# Patient Record
Sex: Male | Born: 1960 | Race: White | Hispanic: No | Marital: Married | Smoking: Never smoker
Health system: Southern US, Community
[De-identification: ages and names within clinical notes are randomized; demographics above are authoritative.]

## PROBLEM LIST (undated history)

## (undated) DIAGNOSIS — Z9889 Other specified postprocedural states: Secondary | ICD-10-CM

## (undated) DIAGNOSIS — G43909 Migraine, unspecified, not intractable, without status migrainosus: Secondary | ICD-10-CM

## (undated) DIAGNOSIS — I639 Cerebral infarction, unspecified: Secondary | ICD-10-CM

## (undated) DIAGNOSIS — G4730 Sleep apnea, unspecified: Secondary | ICD-10-CM

## (undated) DIAGNOSIS — I219 Acute myocardial infarction, unspecified: Secondary | ICD-10-CM

## (undated) DIAGNOSIS — K648 Other hemorrhoids: Secondary | ICD-10-CM

## (undated) DIAGNOSIS — M542 Cervicalgia: Secondary | ICD-10-CM

## (undated) DIAGNOSIS — G629 Polyneuropathy, unspecified: Secondary | ICD-10-CM

## (undated) DIAGNOSIS — E11319 Type 2 diabetes mellitus with unspecified diabetic retinopathy without macular edema: Secondary | ICD-10-CM

## (undated) DIAGNOSIS — Z87442 Personal history of urinary calculi: Secondary | ICD-10-CM

## (undated) DIAGNOSIS — G8929 Other chronic pain: Secondary | ICD-10-CM

## (undated) DIAGNOSIS — N200 Calculus of kidney: Secondary | ICD-10-CM

## (undated) DIAGNOSIS — E7800 Pure hypercholesterolemia, unspecified: Secondary | ICD-10-CM

## (undated) DIAGNOSIS — Z8739 Personal history of other diseases of the musculoskeletal system and connective tissue: Secondary | ICD-10-CM

## (undated) DIAGNOSIS — K219 Gastro-esophageal reflux disease without esophagitis: Secondary | ICD-10-CM

## (undated) DIAGNOSIS — R51 Headache: Secondary | ICD-10-CM

## (undated) DIAGNOSIS — K224 Dyskinesia of esophagus: Secondary | ICD-10-CM

## (undated) DIAGNOSIS — I2510 Atherosclerotic heart disease of native coronary artery without angina pectoris: Secondary | ICD-10-CM

## (undated) DIAGNOSIS — E119 Type 2 diabetes mellitus without complications: Secondary | ICD-10-CM

## (undated) DIAGNOSIS — J342 Deviated nasal septum: Secondary | ICD-10-CM

## (undated) DIAGNOSIS — N289 Disorder of kidney and ureter, unspecified: Secondary | ICD-10-CM

## (undated) DIAGNOSIS — K2970 Gastritis, unspecified, without bleeding: Secondary | ICD-10-CM

## (undated) DIAGNOSIS — M1990 Unspecified osteoarthritis, unspecified site: Secondary | ICD-10-CM

## (undated) DIAGNOSIS — R519 Headache, unspecified: Secondary | ICD-10-CM

## (undated) DIAGNOSIS — J189 Pneumonia, unspecified organism: Secondary | ICD-10-CM

## (undated) DIAGNOSIS — R140 Abdominal distension (gaseous): Secondary | ICD-10-CM

## (undated) DIAGNOSIS — M549 Dorsalgia, unspecified: Secondary | ICD-10-CM

## (undated) DIAGNOSIS — I739 Peripheral vascular disease, unspecified: Secondary | ICD-10-CM

## (undated) DIAGNOSIS — A0472 Enterocolitis due to Clostridium difficile, not specified as recurrent: Secondary | ICD-10-CM

## (undated) DIAGNOSIS — I10 Essential (primary) hypertension: Secondary | ICD-10-CM

## (undated) DIAGNOSIS — R112 Nausea with vomiting, unspecified: Secondary | ICD-10-CM

## (undated) DIAGNOSIS — R931 Abnormal findings on diagnostic imaging of heart and coronary circulation: Secondary | ICD-10-CM

## (undated) HISTORY — DX: Peripheral vascular disease, unspecified: I73.9

## (undated) HISTORY — PX: REFRACTIVE SURGERY: SHX103

## (undated) HISTORY — DX: Dyskinesia of esophagus: K22.4

## (undated) HISTORY — DX: Pure hypercholesterolemia, unspecified: E78.00

## (undated) HISTORY — DX: Essential (primary) hypertension: I10

## (undated) HISTORY — DX: Abdominal distension (gaseous): R14.0

## (undated) HISTORY — DX: Cerebral infarction, unspecified: I63.9

## (undated) HISTORY — DX: Abnormal findings on diagnostic imaging of heart and coronary circulation: R93.1

## (undated) HISTORY — DX: Acute myocardial infarction, unspecified: I21.9

## (undated) HISTORY — DX: Type 2 diabetes mellitus with unspecified diabetic retinopathy without macular edema: E11.319

## (undated) HISTORY — PX: COLONOSCOPY W/ POLYPECTOMY: SHX1380

## (undated) HISTORY — DX: Gastritis, unspecified, without bleeding: K29.70

## (undated) HISTORY — DX: Other hemorrhoids: K64.8

## (undated) HISTORY — PX: HYDROCELE EXCISION: SHX482

---

## 1979-12-10 HISTORY — PX: CIRCUMCISION: SUR203

## 2012-12-09 DIAGNOSIS — I639 Cerebral infarction, unspecified: Secondary | ICD-10-CM

## 2012-12-09 HISTORY — DX: Cerebral infarction, unspecified: I63.9

## 2013-04-07 DIAGNOSIS — I10 Essential (primary) hypertension: Secondary | ICD-10-CM | POA: Insufficient documentation

## 2013-04-07 DIAGNOSIS — E1165 Type 2 diabetes mellitus with hyperglycemia: Secondary | ICD-10-CM

## 2013-04-07 DIAGNOSIS — I6529 Occlusion and stenosis of unspecified carotid artery: Secondary | ICD-10-CM | POA: Insufficient documentation

## 2013-04-07 DIAGNOSIS — I6381 Other cerebral infarction due to occlusion or stenosis of small artery: Secondary | ICD-10-CM

## 2013-04-07 DIAGNOSIS — K219 Gastro-esophageal reflux disease without esophagitis: Secondary | ICD-10-CM | POA: Insufficient documentation

## 2013-04-07 DIAGNOSIS — E785 Hyperlipidemia, unspecified: Secondary | ICD-10-CM

## 2013-04-07 DIAGNOSIS — M489 Spondylopathy, unspecified: Secondary | ICD-10-CM

## 2013-04-07 DIAGNOSIS — IMO0002 Reserved for concepts with insufficient information to code with codable children: Secondary | ICD-10-CM

## 2013-04-07 DIAGNOSIS — I2510 Atherosclerotic heart disease of native coronary artery without angina pectoris: Secondary | ICD-10-CM

## 2013-04-07 HISTORY — DX: Type 2 diabetes mellitus with hyperglycemia: E11.65

## 2013-04-07 HISTORY — DX: Hyperlipidemia, unspecified: E78.5

## 2013-04-07 HISTORY — DX: Occlusion and stenosis of unspecified carotid artery: I65.29

## 2013-04-07 HISTORY — DX: Spondylopathy, unspecified: M48.9

## 2013-04-07 HISTORY — DX: Other cerebral infarction due to occlusion or stenosis of small artery: I63.81

## 2013-04-07 HISTORY — DX: Atherosclerotic heart disease of native coronary artery without angina pectoris: I25.10

## 2013-04-07 HISTORY — DX: Reserved for concepts with insufficient information to code with codable children: IMO0002

## 2013-04-07 HISTORY — DX: Essential (primary) hypertension: I10

## 2013-12-09 DIAGNOSIS — R112 Nausea with vomiting, unspecified: Secondary | ICD-10-CM

## 2013-12-09 DIAGNOSIS — Z9889 Other specified postprocedural states: Secondary | ICD-10-CM

## 2013-12-09 HISTORY — DX: Other specified postprocedural states: Z98.890

## 2013-12-09 HISTORY — DX: Other specified postprocedural states: R11.2

## 2014-07-04 ENCOUNTER — Encounter: Payer: Self-pay | Admitting: Neurology

## 2014-07-04 ENCOUNTER — Ambulatory Visit (INDEPENDENT_AMBULATORY_CARE_PROVIDER_SITE_OTHER): Payer: No Typology Code available for payment source | Admitting: Neurology

## 2014-07-04 VITALS — BP 160/88 | HR 87 | Ht 65.0 in | Wt 203.9 lb

## 2014-07-04 DIAGNOSIS — E1165 Type 2 diabetes mellitus with hyperglycemia: Secondary | ICD-10-CM

## 2014-07-04 DIAGNOSIS — E1140 Type 2 diabetes mellitus with diabetic neuropathy, unspecified: Secondary | ICD-10-CM

## 2014-07-04 DIAGNOSIS — I63311 Cerebral infarction due to thrombosis of right middle cerebral artery: Secondary | ICD-10-CM | POA: Insufficient documentation

## 2014-07-04 DIAGNOSIS — I639 Cerebral infarction, unspecified: Secondary | ICD-10-CM

## 2014-07-04 DIAGNOSIS — E1159 Type 2 diabetes mellitus with other circulatory complications: Secondary | ICD-10-CM | POA: Insufficient documentation

## 2014-07-04 DIAGNOSIS — I6350 Cerebral infarction due to unspecified occlusion or stenosis of unspecified cerebral artery: Secondary | ICD-10-CM

## 2014-07-04 DIAGNOSIS — IMO0001 Reserved for inherently not codable concepts without codable children: Secondary | ICD-10-CM

## 2014-07-04 DIAGNOSIS — Z8673 Personal history of transient ischemic attack (TIA), and cerebral infarction without residual deficits: Secondary | ICD-10-CM | POA: Insufficient documentation

## 2014-07-04 DIAGNOSIS — I6521 Occlusion and stenosis of right carotid artery: Secondary | ICD-10-CM

## 2014-07-04 DIAGNOSIS — I10 Essential (primary) hypertension: Secondary | ICD-10-CM

## 2014-07-04 DIAGNOSIS — E785 Hyperlipidemia, unspecified: Secondary | ICD-10-CM

## 2014-07-04 DIAGNOSIS — I6529 Occlusion and stenosis of unspecified carotid artery: Secondary | ICD-10-CM

## 2014-07-04 HISTORY — DX: Cerebral infarction due to thrombosis of right middle cerebral artery: I63.311

## 2014-07-04 HISTORY — DX: Type 2 diabetes mellitus with diabetic neuropathy, unspecified: E11.40

## 2014-07-04 NOTE — Patient Instructions (Signed)
1. Will do carotid doppler to evaluate the carotid stenosis especially the right one. 2. Will decide if need CT angio neck or MRI angio of neck 3. Still has risk factor or DM, HTN, HLD and needs continued control.  4. Follow up with PCP for risk factor contrl 5. Continue ASA and lipitor for stroke prevention 6. Blood work today - A1C, lipid profile, TSH, free T4, LFT 7. Follow up in 2 months.

## 2014-07-04 NOTE — Progress Notes (Signed)
NEUROLOGY CLINIC NEW PATIENT NOTE  NAME: Johnathan Keith DOB: February 08, 1961  I saw Johnathan Keith as a new patient in the neurovascular clinic today regarding hx of stroke and carotid stenosis.  HPI: Johnathan Keith is a 53 y.o. male with PMH of HTN, DM, HLD and stroke presented as now patient. According to the note, "he was admitted to [facility] on March 08, 2013, with a 2 day history of disequilibrium and difficulties with balance, "feeling like he is walking drunk". With this, disequilibrium, he noticed that he was drifting to the left. On presentation to his local emergency department he was found to have hypertensive urgency with blood pressures in excess of 200 systolic and 100 diastolic, he also had blood glucose levels in the 400s. The patient has a long-standing history of hypertension and diabetes but was not on any medical therapy due to noncompliance. With the above symptomatology he also endorsed some some left facial numbness and tingling. He was admitted to the hospital and underwent a complete workup for TIA and stroke. CT of the head was unremarkable as well as an MRI MRA of the brain dated 03/08/2013, which did not show any acute or subacute ischemia, however, it did show chronic small vessel ischemic disease in the bilateral cerebral hemispheres as well as the pons. In addition, the patient was found by carotid Doppler ultrasound to have a high-grade right ICA stenosis gauged at approximately 70%, his ICA/CCA ratio was 3.19, as compared to his left ICA/CCA ratio 1.02. His transthoracic echocardiogram was otherwise unremarkable with a normal ejection fraction. Laboratory studies taken during his hospitalization revealed normal coagulation, creatinine 0.40. Elevated transaminases, AST 72, ALP 92, and a hemoglobin A1c of 13.3. 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 When questioned about his sleep,he does report snoring heavily but has never awoken himself snoring nor does his wife report seeing him have any pauses in breathing or gasping for breath. During his hospitalization, MRI of the neck also revealed degenerative disc disease without cord compression, most prominent at C 6-7. He reports a history of left-sided arm, neck and shoulder pain which resolved approximately a year ago. Currently, he has only been having intermittent pain into his left hand which radiates back to the elbow. In addition since discharge, the patient has been having occasional bilateral blurry vision in the a.m. which improves throughout the day time, he attributes his vision changes to improvement in his glucose control. He has also been having increased lower extremity dependent pitting edema, which is likely secondary to starting amlodipine. He has been undergoing physical therapy on an outpatient basis and has progressed from using a walker to a quad cane for balance."  Since the stroke, pt was admitted to rehab for 3-4 weeks before going home. He stated that his left sided weakness getting improved over the year, but still has left leg dragging if he walks long distance. He still has left facial and arm numbness tingling sensation. He follows up with his PCP for HTN and DM and lipid control, and his glucose initially controlled good but lately has some problems. His HTN still intermittently high. He has also followed with outside neurologist regarding the right ICA stenosis >70% but was told that he needs cerebral angiogram before able to do anything. However, that was never happened because he did not have insurance at that time. Now,  he got limited insurance and he is applying for disability but his neurologist has left practices. Therefore, he was referred here for follow up.  Outside reports reviewed: historical medical records and imaging reports: see above  Past  Medical History  Diagnosis Date  . Hypertension   . Diabetes mellitus without complication   . High cholesterol   . Stroke   . Heart attack    Past Surgical History  Procedure Laterality Date  . Hydrocele excision     Family History  Problem Relation Age of Onset  . Heart disease Mother   . Cancer Father   . Colon cancer Sister   . Gout Maternal Uncle   . Diabetes Maternal Grandfather   . Gout Brother    Current Outpatient Prescriptions  Medication Sig Dispense Refill  . amLODipine (NORVASC) 5 MG tablet Take 5 mg by mouth daily.      Marland Kitchen aspirin 325 MG tablet Take 325 mg by mouth daily.      Marland Kitchen atorvastatin (LIPITOR) 40 MG tablet Take 40 mg by mouth at bedtime.      . dicyclomine (BENTYL) 10 MG capsule Take 10 mg by mouth 2 (two) times daily.      . Diphenoxylate-Atropine (LOMOTIL PO) Take 10 tablets by mouth 2 (two) times daily.      . furosemide (LASIX) 80 MG tablet Take 80 mg by mouth daily.      . insulin NPH-regular Human (NOVOLIN 70/30) (70-30) 100 UNIT/ML injection Inject 50 Units into the skin 2 (two) times daily.      Marland Kitchen lisinopril (PRINIVIL,ZESTRIL) 40 MG tablet Take 40 mg by mouth daily.      . metoprolol tartrate (LOPRESSOR) 25 MG tablet Take 25 mg by mouth 2 (two) times daily.      Marland Kitchen omeprazole (PRILOSEC) 20 MG capsule Take 20 mg by mouth daily.      . potassium chloride SA (K-DUR,KLOR-CON) 20 MEQ tablet Take 20 mEq by mouth daily.       No current facility-administered medications for this visit.   No Known Allergies History   Social History  . Marital Status: Unknown    Spouse Name: N/A    Number of Children: 5  . Years of Education: N/A   Occupational History  . Not on file.   Social History Main Topics  . Smoking status: Former Games developer  . Smokeless tobacco: Not on file  . Alcohol Use: No  . Drug Use: No  . Sexual Activity: Yes    Partners: Female   Other Topics Concern  . Not on file   Social History Narrative   Patient lives with his family      Patient is right handed   Patient coffee     Review of Systems Full 14 system review of systems performed and notable only for those listed, all others are neg:  Constitutional: N/A  Cardiovascular: N/A  Ear/Nose/Throat: blurry vision Skin: N/A  Eyes: N/A  Respiratory: N/A  Gastroitestinal: diarrhea Hematology/Lymphatic: N/A  Endocrine: feeling hot and cold Musculoskeletal: joint pain  Allergy/Immunology: allergies  Neurological: N/A  Psychiatric: N/A  Physical Exam  Filed Vitals:   07/04/14 1026  BP: 160/88  Pulse: 87   General - Well nourished, well developed, in no apparent distress.  Ophthalmologic - Sharp disc margins OU.  Cardiovascular - Regular rate and rhythm with no murmur. Carotid pulses were 2+ without bruits.   Neck - supple, no nuchal rigidity.  Mental Status -  Level of  arousal and orientation to time, place, and person were intact. Language including expression, naming, repetition, comprehension was assessed and found intact. Attention span and concentration were normal. Recent and remote memory were intact. Fund of Knowledge was assessed and was intact.  Cranial Nerves II - XII - II - Vision intact OU. III, IV, VI - Extraocular movements intact. V - Facial sensation decreased on the left. VII - Facial movement intact bilaterally. VIII - Hearing & vestibular intact bilaterally. X - Palate elevates symmetrically. XI - Chin turning & shoulder shrug intact bilaterally. XII - Tongue protrusion intact.  Motor Strength - The patient's strength was normal in all extremities and pronator drift was absent.  Bulk was normal and fasciculations were absent.   Motor Tone - Muscle tone was assessed at the neck and appendages and was normal.  Reflexes - The patient's reflexes were normal in all extremities and he had no pathological reflexes.  Sensory - Light touch, temperature/pinprick were assessed and were decreased on left UE.    Coordination - The  patient had normal movements in the hands and feet with no ataxia or dysmetria.  Tremor was absent.  Gait and Station - very mild left hemiparetic gait (left leg dragging).   Imaging 03/08/2013 - MRI brain did not show acute ischemia but questionable right pons small acute DWI changes but without ADC attenuation. Diffuse small vessel white matter changes.  Lab Review Not available  Assessments: NIHSS: 1 Rankin: 2   Assessment and Plan:   In summary, Johnathan Keith as new patient for clinic follow up of stroke and right carotid stenosis > 70% in 2014. He had left hemiparesis in 2014 but CT and MRI did not show acute stroke, however, by my read, it is questionable a small DWI change at right pons without corresponding ADC attenuation. Regardless, he has multiple stroke risk factors including HTN, DM, HLD, and right carotid stenosis. He is on ASA and lipitor now for stroke prevention. He needs to continue follow up with his PCP for stroke risk factor modification and control BP, glucose and LDL.  He also has right carotid stenosis at more than 70% one year ago without intervention so far. He had left hemiparesis last year, and it could be a right hemisphere stroke vs. Brainstem stroke. Although not able to for sure say asymptomatic or symptomatic carotid stenosis, he may be considered a candidate for CEA or stenting. I will repeat carotid doppler and then decide whether further imaging needed for evaluation.  I also recommend aggressive blood pressure control with a goal <130/80 mm Hg.  Lipids should be managed intensively, with a goal LDL < 70 mg/dL.  I encouraged the patient to discuss these important issues with his primary care physician.  I counseled the patient on measures to reduce stroke risk, including the importance of medication compliance, risk factor control, exercise, healthy diet, and avoidance of smoking.  I reviewed stroke warning signs and symptoms and appropriate actions  to take if such occurs. He will follow-up with me in 2 months.   - carotid doppler  - stroke labs including A1C, TSH, free T4, lipid panel, LFT - stroke risk factor control - continue ASA and lipitor for stroke prevention - follow up with PCP for risk factor control - RTC in 2 months.   Patient Instructions  1. Will do carotid doppler to evaluate the carotid stenosis especially the right one. 2. Will decide if need CT angio neck or MRI angio of neck  3. Still has risk factor or DM, HTN, HLD and needs continued control.  4. Follow up with PCP for risk factor contrl 5. Continue ASA and lipitor for stroke prevention 6. Blood work today - A1C, lipid profile, TSH, free T4, LFT 7. Follow up in 2 months.   Thank you very much for the opportunity to participate in the care of this patient.  Please do not hesitate to call if any questions or concerns arise.  Marvel PlanJindong Adyan Palau, MD PhD [facility] [address], [address] [place], [place] [address] [phone]

## 2014-07-27 ENCOUNTER — Other Ambulatory Visit (INDEPENDENT_AMBULATORY_CARE_PROVIDER_SITE_OTHER): Payer: No Typology Code available for payment source

## 2014-07-27 ENCOUNTER — Ambulatory Visit (INDEPENDENT_AMBULATORY_CARE_PROVIDER_SITE_OTHER): Payer: No Typology Code available for payment source

## 2014-07-27 DIAGNOSIS — I6350 Cerebral infarction due to unspecified occlusion or stenosis of unspecified cerebral artery: Secondary | ICD-10-CM

## 2014-07-27 DIAGNOSIS — I639 Cerebral infarction, unspecified: Secondary | ICD-10-CM

## 2014-07-27 DIAGNOSIS — Z0289 Encounter for other administrative examinations: Secondary | ICD-10-CM

## 2014-07-28 ENCOUNTER — Telehealth: Payer: Self-pay | Admitting: *Deleted

## 2014-07-28 LAB — HEPATIC FUNCTION PANEL
ALT: 34 IU/L (ref 0–44)
AST: 20 IU/L (ref 0–40)
Albumin: 4.3 g/dL (ref 3.5–5.5)
Alkaline Phosphatase: 58 IU/L (ref 39–117)
Bilirubin, Direct: 0.15 mg/dL (ref 0.00–0.40)
Total Bilirubin: 0.5 mg/dL (ref 0.0–1.2)
Total Protein: 6.8 g/dL (ref 6.0–8.5)

## 2014-07-28 LAB — LIPID PANEL
Chol/HDL Ratio: 5.2 ratio units — ABNORMAL HIGH (ref 0.0–5.0)
Cholesterol, Total: 156 mg/dL (ref 100–199)
HDL: 30 mg/dL — ABNORMAL LOW (ref >39)
LDL Calculated: 84 mg/dL (ref 0–99)
Triglycerides: 210 mg/dL — ABNORMAL HIGH (ref 0–149)
VLDL Cholesterol Cal: 42 mg/dL — ABNORMAL HIGH (ref 5–40)

## 2014-07-28 LAB — HEMOGLOBIN A1C
Est. average glucose Bld gHb Est-mCnc: 306 mg/dL
Hgb A1c MFr Bld: 12.3 % — ABNORMAL HIGH (ref 4.8–5.6)

## 2014-07-28 LAB — TSH+FREE T4
Free T4: 1.06 ng/dL (ref 0.82–1.77)
TSH: 2.86 u[IU]/mL (ref 0.450–4.500)

## 2014-07-28 NOTE — Telephone Encounter (Signed)
Referred Dr. Warren Danes message in regards to DM being out of control. Patient needs to follow up with his PCP ASAP.

## 2014-07-28 NOTE — Progress Notes (Signed)
Quick Note:  Could you please share the test result with pt and tell him that his DM is still not in good control. Please ask him to see his PCP ASAP to discuss about better glycemic control. Thank you.  Marvel Plan, MD PhD Stroke Neurology 07/28/2014 3:54 PM   ______

## 2014-07-29 ENCOUNTER — Telehealth: Payer: Self-pay | Admitting: Neurology

## 2014-07-29 NOTE — Telephone Encounter (Signed)
Patient's wife calling wanting to discuss the results of patient's carotid ultrasound done on 8/19, please return call to patient's wife and advise.

## 2014-07-29 NOTE — Telephone Encounter (Signed)
Called the patient wife back to let her know her husband results was not read yet. No one answered lvm asking her to return  My call.

## 2014-07-29 NOTE — Telephone Encounter (Signed)
Patient wife called and stated she had concerns about her husband test and wanted a sooner appointment. I  gave her a appointment  For next week with Dr. Roda Shutters.

## 2014-08-04 ENCOUNTER — Ambulatory Visit (INDEPENDENT_AMBULATORY_CARE_PROVIDER_SITE_OTHER): Payer: No Typology Code available for payment source | Admitting: Neurology

## 2014-08-04 ENCOUNTER — Encounter: Payer: Self-pay | Admitting: Neurology

## 2014-08-04 VITALS — BP 155/89 | HR 72 | Ht 65.0 in | Wt 219.0 lb

## 2014-08-04 DIAGNOSIS — I6350 Cerebral infarction due to unspecified occlusion or stenosis of unspecified cerebral artery: Secondary | ICD-10-CM

## 2014-08-04 DIAGNOSIS — I10 Essential (primary) hypertension: Secondary | ICD-10-CM

## 2014-08-04 DIAGNOSIS — I6521 Occlusion and stenosis of right carotid artery: Secondary | ICD-10-CM

## 2014-08-04 DIAGNOSIS — E1165 Type 2 diabetes mellitus with hyperglycemia: Secondary | ICD-10-CM

## 2014-08-04 DIAGNOSIS — E785 Hyperlipidemia, unspecified: Secondary | ICD-10-CM

## 2014-08-04 DIAGNOSIS — IMO0001 Reserved for inherently not codable concepts without codable children: Secondary | ICD-10-CM

## 2014-08-04 DIAGNOSIS — I639 Cerebral infarction, unspecified: Secondary | ICD-10-CM

## 2014-08-04 DIAGNOSIS — I6529 Occlusion and stenosis of unspecified carotid artery: Secondary | ICD-10-CM

## 2014-08-04 NOTE — Patient Instructions (Signed)
-   referral to vascular surgery for right carotid stenosis, progressed from one year ago. - continue ASA and lipitor for stroke prevention - continue to follow up with PCP for stroke risk modification - request a home BP monitor device prescription from your PCP - follow up in 3 months.

## 2014-08-04 NOTE — Progress Notes (Signed)
STROKE NEUROLOGY FOLLOW UP NOTE  NAME: Johnathan Keith DOB: 1961-10-19  REASON FOR VISIT: stroke follow up HISTORY FROM: pt and chart  Today we had the pleasure of seeing Johnathan Keith in follow-up at our Neurology Clinic. Pt was accompanied by daughter.   History Summary Johnathan Keith presents today as clinic follow up of stroke and right carotid stenosis > 70% in 2014. He had left hemiparesis in 2014 but CT and MRI did not show acute stroke, however, by my read, it is questionable a small DWI change at right pons without corresponding ADC attenuation. He has multiple stroke risk factors including HTN, DM, HLD, and right carotid stenosis.  He is on ASA and lipitor now for stroke prevention.  Interval History He was last seen on 07/04/14. During the interval time, the patient has been doing fine without changes. He has seen his PCP and made changes for his DM regimen since his A1c was very high.    His right carotid stenosis > 70% a year ago without intervention, at that time his ICA/CCA ratio was 3.19, as compared to his left ICA/CCA ratio 1.02.  During the interval, we repeated CUS which showed right proximal/mid ICA >70% stenosis, the right ICA/CCA ratio 7.06 and left ICA/CCA ratio 0.95. The right ICA stenosis progressed from one year ago.  He also complains that he has finger and toes tingling numbness as well as distal extremities cold with purple color from time to time.   REVIEW OF SYSTEMS: Full 14 system review of systems performed and notable only for those listed below and in HPI above, all others are negative:  Constitutional: N/A  Cardiovascular: leg swelling  Ear/Nose/Throat: facial swelling, ear discharge Skin: N/A  Eyes: eye discharge, eye itching, eye redness, blurry vision  Respiratory: cough  Gastroitestinal: constipation, diarrhea  Genitourinary: N/A Hematology/Lymphatic: N/A  Endocrine: N/A  Musculoskeletal: N/A  Allergy/Immunology: N/A  Neurological: walking  difficulty, numbness, memory loss, weakness  Psychiatric: insomnia, frequent waking  The following represents the patient's updated allergies and side effects list: No Known Allergies  Labs since last visit of relevance include the following: Results for orders placed in visit on 07/04/14  TSH+FREE T4      Result Value Ref Range   TSH 2.860  0.450 - 4.500 uIU/mL   Free T4 1.06  0.82 - 1.77 ng/dL  HEMOGLOBIN A5W      Result Value Ref Range   Hemoglobin A1C 12.3 (*) 4.8 - 5.6 %   Estimated average glucose 306    LIPID PANEL      Result Value Ref Range   Cholesterol, Total 156  100 - 199 mg/dL   Triglycerides 098 (*) 0 - 149 mg/dL   HDL 30 (*) >11 mg/dL   VLDL Cholesterol Cal 42 (*) 5 - 40 mg/dL   LDL Calculated 84  0 - 99 mg/dL   Chol/HDL Ratio 5.2 (*) 0.0 - 5.0 ratio units  HEPATIC FUNCTION PANEL      Result Value Ref Range   Total Protein 6.8  6.0 - 8.5 g/dL   Albumin 4.3  3.5 - 5.5 g/dL   Total Bilirubin 0.5  0.0 - 1.2 mg/dL   Bilirubin, Direct 9.14  0.00 - 0.40 mg/dL   Alkaline Phosphatase 58  39 - 117 IU/L   AST 20  0 - 40 IU/L   ALT 34  0 - 44 IU/L    The neurologically relevant items on the patient's problem list were reviewed on today's visit.  Neurologic Examination  A problem focused neurological exam (12 or more points of the single system neurologic examination, vital signs counts as 1 point, cranial nerves count for 8 points) was performed.  Blood pressure 155/89, pulse 72, height  (1.651 m), weight 219 lb (99.338 kg).  General - Well nourished, well developed, in no apparent distress.  Ophthalmologic - Sharp disc margins OU.  Cardiovascular - Regular rate and rhythm with no murmur. Carotid pulses were 2+ without bruits.  Neck - supple, no nuchal rigidity.  Mental Status -  Level of arousal and orientation to time, place, and person were intact.  Language including expression, naming, repetition, comprehension was assessed and found intact.  Attention  span and concentration were normal.  Recent and remote memory were intact.  Fund of Knowledge was assessed and was intact.  Cranial Nerves II - XII -  II - Vision intact OU.  III, IV, VI - Extraocular movements intact.  V - Facial sensation decreased on the left.  VII - Facial movement intact bilaterally.  VIII - Hearing & vestibular intact bilaterally.  X - Palate elevates symmetrically.  XI - Chin turning & shoulder shrug intact bilaterally.  XII - Tongue protrusion intact.  Motor Strength - The patient's strength was normal in all extremities and pronator drift was absent. Bulk was normal and fasciculations were absent.  Motor Tone - Muscle tone was assessed at the neck and appendages and was normal.  Reflexes - The patient's reflexes were normal in all extremities and he had no pathological reflexes.  Sensory - Light touch, temperature/pinprick were assessed and were decreased on left UE.  Coordination - The patient had normal movements in the hands and feet with no ataxia or dysmetria. Tremor was absent.  Gait and Station - very mild left hemiparetic gait (left leg dragging).  Assessment: As you may recall, he is a 53 y.o. Caucasian male with PMH of HTN, HLD, DM and right ICA stenosis followed up in clinic today for stroke and right ICA stenosis. He had left hemiparesis in 2014 but CT and MRI did not show acute stroke, however, by my read, it is questionable a small DWI change at right pons without corresponding ADC attenuation. Regardless, he has multiple stroke risk factors including HTN, DM, HLD, and right carotid stenosis. He is on ASA and lipitor now for stroke prevention. He needs to continue follow up with his PCP for stroke risk factor modification and control BP, glucose and LDL.   He also has right carotid stenosis at more than 70% one year ago without intervention. We repeat carotid doppler in clinic, showed progression of the right ICA stenosis. He had left hemiparesis last  year, and it could be a right hemisphere stroke vs. Brainstem stroke. Although not able to for sure say asymptomatic or symptomatic carotid stenosis. I would like to refer him to a vascular surgeon since he may be a candidate for CEA or stenting.  Plan:  - referral made to vascular surgery for right ICA stenosis - continue ASA and plavix for stroke prevention - follow up with PCP for stroke risk factor modification - continue to monitor BP and glucose level at home closely - RTC in 3 months.  Orders Placed This Encounter  Procedures  . Ambulatory referral to Vascular Surgery    Referral Priority:  Routine    Referral Type:  Surgical    Referral Reason:  Specialty Services Required    Requested Specialty:  Vascular Surgery  Number of Visits Requested:  1    Patient Instructions  - referral to vascular surgery for right carotid stenosis, progressed from one year ago. - continue ASA and lipitor for stroke prevention - continue to follow up with PCP for stroke risk modification - request a home BP monitor device prescription from your PCP - follow up in 3 months.   Marvel Plan, MD PhD [facility] [address] [place], [place] [address] [phone]

## 2014-08-19 ENCOUNTER — Telehealth: Payer: Self-pay | Admitting: Neurology

## 2014-08-19 NOTE — Telephone Encounter (Signed)
Called [facility] and it was not in there que i sent it via epic. They are going to call the patient ASAP and schedule a appointment

## 2014-08-19 NOTE — Telephone Encounter (Signed)
Spoke to patient and he relayed that [facility] at [phone] and they have not received the referral information.  According to chart the referral was sent on 08-04-14.  I will forward to doctors assistant to check on this.

## 2014-08-22 ENCOUNTER — Other Ambulatory Visit: Payer: Self-pay

## 2014-08-22 DIAGNOSIS — Z01810 Encounter for preprocedural cardiovascular examination: Secondary | ICD-10-CM

## 2014-08-22 DIAGNOSIS — I6529 Occlusion and stenosis of unspecified carotid artery: Secondary | ICD-10-CM

## 2014-09-08 ENCOUNTER — Encounter: Payer: Self-pay | Admitting: Vascular Surgery

## 2014-09-09 ENCOUNTER — Other Ambulatory Visit: Payer: Self-pay | Admitting: *Deleted

## 2014-09-09 ENCOUNTER — Ambulatory Visit (HOSPITAL_COMMUNITY)
Admission: RE | Admit: 2014-09-09 | Discharge: 2014-09-09 | Disposition: A | Discharge status: Home / Self Care | Payer: No Typology Code available for payment source | Source: Ambulatory Visit | Attending: Vascular Surgery | Admitting: Vascular Surgery

## 2014-09-09 ENCOUNTER — Ambulatory Visit (INDEPENDENT_AMBULATORY_CARE_PROVIDER_SITE_OTHER): Payer: No Typology Code available for payment source | Admitting: Vascular Surgery

## 2014-09-09 ENCOUNTER — Encounter: Payer: Self-pay | Admitting: Vascular Surgery

## 2014-09-09 VITALS — BP 139/86 | HR 75 | Temp 97.1°F | Resp 18 | Ht 65.5 in | Wt 224.7 lb

## 2014-09-09 DIAGNOSIS — I6529 Occlusion and stenosis of unspecified carotid artery: Secondary | ICD-10-CM

## 2014-09-09 DIAGNOSIS — Z01810 Encounter for preprocedural cardiovascular examination: Secondary | ICD-10-CM | POA: Diagnosis not present

## 2014-09-09 DIAGNOSIS — I6521 Occlusion and stenosis of right carotid artery: Secondary | ICD-10-CM

## 2014-09-09 NOTE — Progress Notes (Signed)
New Carotid Patient  Referred by:  Marvel Plan, MD PhD  [facility]  [address]  [place], [place] [address]  [phone]  Reason for referral: symptomatic right carotid stenosis  History of Present Illness  Johnathan Keith is a 53 y.o. (05-26-1961) male who presents with chief complaint: right carotid stenosis.  Previous carotid studies demonstrated: RICA >70% stenosis.  Patient has a history of stroke symptoms back in March of 2014 where he experienced left sided weakness and balance difficulties. His weakness have mostly resolved, although he still complains of balance issues. He says he is able to walk a "straight line," but is thrown off balance by "turning corners." Three weeks ago, he had an ophthalmologist exam where his pupils were dilated. That evening, he describes left visual symptoms described as: "everything looked like shadows." He denies any monocular blindness however. This lasted for approximately 45 minutes. He did not return to his ophthalmologist because his symptoms resolved. He recently had laser surgery for diabetic retinopathy of the right eye. He will have laser surgery of the left eye on 09/21/2014. The patient has never had facial drooping. He does not decreased sensation of his left face. The patient has never had receptive or expressive aphasia. The patient's risks factors for carotid disease include: hypertension (on multiple medications), diabetes (on insulin and orals), hyperlipidemia (on a statin). He has never been a smoker.   He believes he had an MI back in his twenties, but is unsure of this.   Past Medical History  Diagnosis Date  . Hypertension   . Diabetes mellitus without complication   . High cholesterol   . Stroke   . Heart attack   . Diabetic retinopathy   . Peripheral vascular disease     Past Surgical History  Procedure Laterality Date  . Hydrocele excision    . Refractive surgery Right 09-07-14   Diabetic retinopathy     History   Social History  . Marital Status: Unknown    Spouse Name: N/A    Number of Children: 5  . Years of Education: 12th   Occupational History  . Disabled    Social History Main Topics  . Smoking status: Never Smoker   . Smokeless tobacco: Not on file  . Alcohol Use: No  . Drug Use: No  . Sexual Activity: Yes    Partners: Female   Other Topics Concern  . Not on file   Social History Narrative   Patient lives with his family    Patient is right handed   Patient coffee     Family History  Problem Relation Age of Onset  . Heart disease Mother   . Diabetes Mother   . Hyperlipidemia Mother   . Hypertension Mother   . Cancer Father     Lung Cancer   . Heart disease Father   . Colon cancer Sister   . Gout Maternal Uncle   . Diabetes Maternal Grandfather   . Gout Brother   . Asthma Brother   . Diabetes Brother     Current Outpatient Prescriptions on File Prior to Visit  Medication Sig Dispense Refill  . amLODipine (NORVASC) 5 MG tablet Take 5 mg by mouth daily.      Marland Kitchen aspirin 325 MG tablet Take 325 mg by mouth daily.      Marland Kitchen atorvastatin (LIPITOR) 40 MG tablet Take 40 mg by mouth at bedtime.      . dicyclomine (BENTYL) 10  MG capsule Take 10 mg by mouth 2 (two) times daily.      . Diphenoxylate-Atropine (LOMOTIL PO) Take 10 tablets by mouth 2 (two) times daily.      . furosemide (LASIX) 80 MG tablet Take 80 mg by mouth daily.      . insulin NPH-regular Human (NOVOLIN 70/30) (70-30) 100 UNIT/ML injection Inject 50 Units into the skin 2 (two) times daily.      Marland Kitchen. lisinopril (PRINIVIL,ZESTRIL) 40 MG tablet Take 40 mg by mouth daily.      . metoprolol tartrate (LOPRESSOR) 25 MG tablet Take 25 mg by mouth 2 (two) times daily.      Marland Kitchen. omeprazole (PRILOSEC) 20 MG capsule Take 20 mg by mouth daily.      . potassium chloride SA (K-DUR,KLOR-CON) 20 MEQ tablet Take 20 mEq by mouth daily.      Marland Kitchen. XIFAXAN 550 MG TABS tablet Take 550 mg by mouth 3  (three) times daily.       No current facility-administered medications on file prior to visit.    No Known Allergies  REVIEW OF SYSTEMS:  (Positives checked otherwise negative)  CARDIOVASCULAR:  []  chest pain, []  chest pressure, []  palpitations, []  shortness of breath when laying flat, [x]  shortness of breath with exertion,  []  pain in feet when walking, [x]  pain in feet when laying flat, []  history of blood clot in veins (DVT), []  history of phlebitis, [x]  swelling in legs, []  varicose veins  PULMONARY:  []  productive cough, []  asthma, []  wheezing  NEUROLOGIC:  []  weakness in arms or legs, []  numbness in arms or legs, []  difficulty speaking or slurred speech, []  temporary loss of vision in one eye, []  dizziness  HEMATOLOGIC:  []  bleeding problems, []  problems with blood clotting too easily  MUSCULOSKEL:  []  joint pain, []  joint swelling  GASTROINTEST:  []  vomiting blood, []  blood in stool     GENITOURINARY:  []  burning with urination, []  blood in urine  PSYCHIATRIC:  []  history of major depression  INTEGUMENTARY:  []  rashes, []  ulcers  CONSTITUTIONAL:  []  fever, []  chills  For VQI Use Only  PRE-ADM LIVING: Home  AMB STATUS: Ambulatory  CAD Sx: History of MI, but no symptoms MI < 6 months ago questionable history of MI in twenties.   PRIOR CHF: None  STRESS TEST: [x ] No, [ ]  Normal, [ ]  + ischemia, [ ]  + MI, [ ]  Both   Physical Examination  Filed Vitals:   09/09/14 1348 09/09/14 1350  BP: 139/84 139/86  Pulse: 78 75  Temp: 97.1 F (36.2 C)   TempSrc: Oral   Resp: 18   Height: 5' 5.5" (1.664 m)   Weight: 224 lb 11.2 oz (101.923 kg)   SpO2: 99%    Body mass index is 36.81 kg/(m^2).  General: A&O x 3, WD overweight male in NAD  Head: [place]/AT  Ear/Nose/Throat: Hearing grossly intact, nares w/o erythema or drainage, oropharynx w/o Erythema/Exudate  Eyes: Pupils unequal, 4 mm on right, 3 mm on left,  but reactive to light. EOMI  Neck: Supple, no carotid  bruits.   Pulmonary: Sym exp, good air movt, CTAB, no rales, rhonchi, & wheezing  Cardiac: RRR, Nl S1, S2, no Murmurs, rubs or gallops  Vascular: Vessel Right Left  Radial Palpable Palpable  Carotid Palpable, without bruit Palpable, without bruit  Aorta  Not palpable N/A  PT Not palpable Not palpable  DP Palpable Palpable   Gastrointestinal: soft, NTND, -G/R, -  HSM, - masses  Musculoskeletal: M/S 5/5 throughout. Extremities without ischemic changes   Neurologic: CN 2-12 intact. Pain and light touch intact in extremities, Motor exam as listed above  Psychiatric: Judgment intact, Mood & affect appropriate for pt's clinical situation  Dermatologic: See M/S exam for extremity exam, no rashes otherwise noted  Lymph: no palpable LAD  Non-Invasive Vascular Imaging  CAROTID DUPLEX (Date: 09/09/2014):   R ICA stenosis: 80-99%  R VA:  patent and antegrade  Medical Decision Making  Johnathan Keith is a 53 y.o. male who presents with: symptomatic ICA stenosis 80-99%.    Based on the patient's vascular studies and examination, I have offered the patient: right carotid endarterectomy versus right carotid stent. This will depend on his cardiac status and anatomy. The patient will be scheduled for cardiac clearance for risk stratification and optimization. He will be scheduled for a CTA of the neck to better define his anatomy.   I discussed in depth with the patient the nature of atherosclerosis, and emphasized the importance of maximal medical management including strict control of blood pressure, blood glucose, and lipid levels, obtaining regular exercise, antiplatelet agents, and avoidance of smoking.  The patient is currently  on a statin: lipitor.    The patient is currently  on an anti-platelet: aspirin 325 mg.    The patient is aware that without maximal medical management the underlying atherosclerotic disease process will progress, limiting the benefit of any  interventions.  Thank you for allowing [place] to participate in this patient's care.  Maris Berger, PA-C [facility] Office: [phone] Pager: [phone]  09/09/2014, 2:43 PM  This patient was seen in conjunction with Dr. [name]   Addendum  I have independently interviewed and examined the patient, and I agree with the physician assistant's findings.  I agree with neurologist that this patient's history is suspicious for a CVA.  Regardless he now has a R ICA stenosis >80%.  Unfortunately, his disease appears to be high so further evaluation with initially a CTA Neck will be necessary.  We discussed that a carotid angiogram may also be necessary if the CTA reveals a bifurcation beyond surgical access.  This patient has a significant CAD history, so further evaluation with a cardiologist is also recommended to help with the surgical vs stenting question.  He will follow up in 2-3 weeks after his neck CTA and cardiac work-up are completed.  At that time, we will discuss his options for intervention.  [name], MD [facility] Office: [phone] Pager: [phone]  09/09/2014, 3:48 PM

## 2014-09-12 ENCOUNTER — Ambulatory Visit: Payer: No Typology Code available for payment source | Admitting: Neurology

## 2014-09-14 ENCOUNTER — Telehealth: Payer: Self-pay | Admitting: Vascular Surgery

## 2014-09-14 ENCOUNTER — Ambulatory Visit (HOSPITAL_COMMUNITY)
Admission: RE | Admit: 2014-09-14 | Discharge: 2014-09-14 | Disposition: A | Discharge status: Home / Self Care | Payer: No Typology Code available for payment source | Source: Ambulatory Visit | Attending: Vascular Surgery | Admitting: Vascular Surgery

## 2014-09-14 DIAGNOSIS — I6521 Occlusion and stenosis of right carotid artery: Secondary | ICD-10-CM | POA: Diagnosis present

## 2014-09-14 DIAGNOSIS — Z01810 Encounter for preprocedural cardiovascular examination: Secondary | ICD-10-CM | POA: Insufficient documentation

## 2014-09-14 LAB — CREATININE, SERUM
Creatinine, Ser: 0.83 mg/dL (ref 0.50–1.35)
GFR calc Af Amer: >90 mL/min (ref >90)
GFR calc non Af Amer: >90 mL/min (ref >90)

## 2014-09-14 LAB — BUN: BUN: 26 mg/dL — ABNORMAL HIGH (ref 6–23)

## 2014-09-14 IMAGING — CT CT ANGIO NECK
1 of 9 series · 5 of 33 positions shown · IV contrast (Iohexol (Omnipaque 350))
Comparison: Carotid Doppler [DATE]

CLINICAL DATA: Right carotid stenosis.

EXAM:
CT ANGIOGRAPHY NECK
TECHNIQUE: Multidetector CT imaging of the neck was performed using the
standard protocol during bolus administration of intravenous
contrast. Multiplanar CT image reconstructions and MIPs were
obtained to evaluate the vascular anatomy. Carotid stenosis
measurements (when applicable) are obtained utilizing NASCET
criteria, using the distal internal carotid diameter as the
denominator.
CONTRAST:  50mL OMNIPAQUE IOHEXOL 350 MG/ML SOLN

[Series 407: mpr axials · axial · 0.47mm/px · z∈[+56,+202]mm · 5 of 220 slices shown]
[im 37/220  soft-tissue]
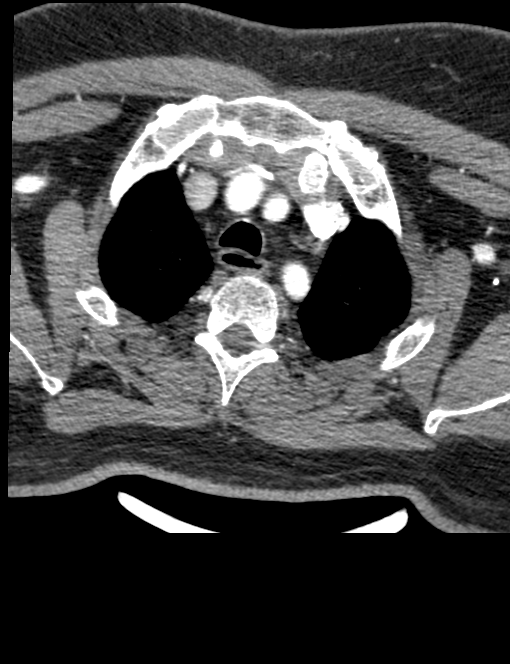
[im 74/220  bone]
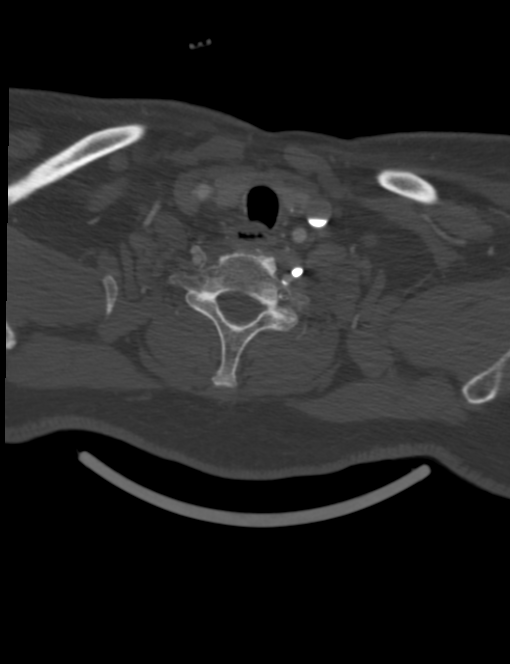
[im 110/220  soft-tissue]
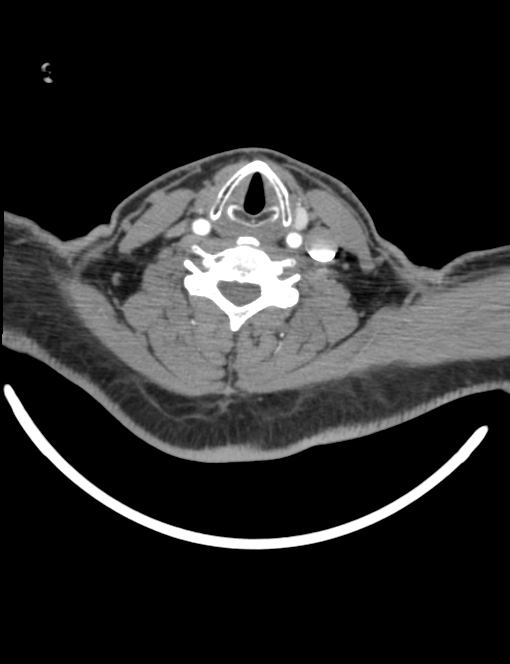
[im 147/220  bone]
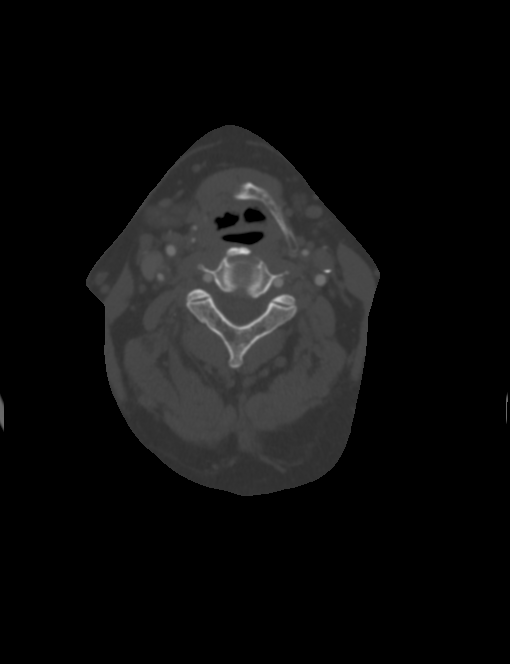
[im 183/220  soft-tissue]
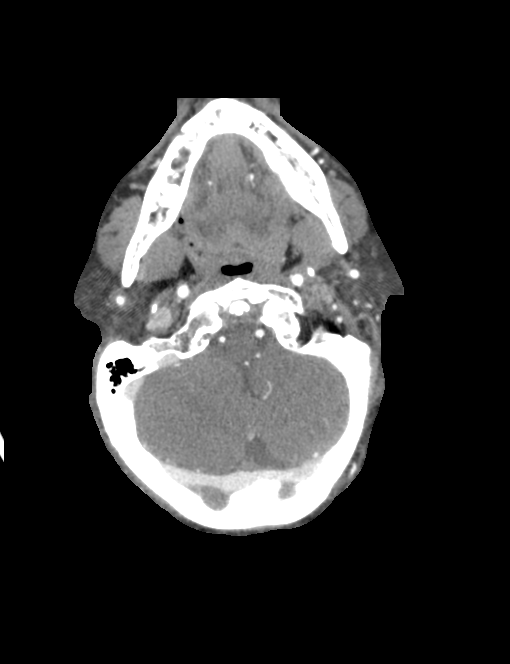

[5 of 33 positions shown; findings below may reference images not displayed]

FINDINGS: Lung apices are clear. No mass or adenopathy in the neck. Mild
cervical degenerative change without focal bony abnormality.

Standard 3 vessel aortic arch. Proximal great vessels are widely
patent.

Right carotid: Right common carotid artery widely patent. Calcified
and noncalcified plaque in the proximal right internal carotid
artery involving the bulb. Approximately 15 mm above the carotid
bifurcation there is a focal severe stenosis narrowing the lumen to
1.7 mm diameter, 75% diameter stenosis. No dissection. Remainder of
the internal carotid artery is widely patent

Left carotid: Left common carotid artery is widely patent. Mild
atherosclerotic plaque in the carotid bulb with calcified and
noncalcified plaque. No significant stenosis. Negative for
dissection

Vertebral arteries: Both vertebral arteries are patent to the
basilar without stenosis. Both vertebral arteries are equal in size.

Review of the MIP images confirms the above findings.
IMPRESSION: Atherosclerotic disease in the right carotid bifurcation. 15 mm
above the bifurcation, focal severe stenosis measuring 75% diameter
stenosis

Mild atherosclerotic disease left carotid bifurcation without
significant stenosis

Both vertebral arteries widely patent.

## 2014-09-14 MED ORDER — IOHEXOL 350 MG/ML SOLN
50.0000 mL | Freq: Once | INTRAVENOUS | Status: AC | PRN
  Administered 2014-09-14: 50 mL via INTRAVENOUS

## 2014-09-14 NOTE — Telephone Encounter (Signed)
Spoke with Darl Pikes. She told me that the cardiology ofc called saying the appt was canceled. I checked the computer. Appt was r/s for 10/21. Needs to be sooner due to surgery upcoming. Called ofc. They r/s to this Friday - Dr. Allyson Sabal at 10:15 am. Darl Pikes took all info. Verbalized understanding.

## 2014-09-15 ENCOUNTER — Ambulatory Visit: Payer: No Typology Code available for payment source | Admitting: Cardiovascular Disease

## 2014-09-16 ENCOUNTER — Encounter: Payer: Self-pay | Admitting: Cardiovascular Disease

## 2014-09-16 ENCOUNTER — Encounter (HOSPITAL_COMMUNITY): Payer: Self-pay | Admitting: Pharmacy Technician

## 2014-09-16 ENCOUNTER — Other Ambulatory Visit: Payer: Self-pay

## 2014-09-16 ENCOUNTER — Ambulatory Visit (INDEPENDENT_AMBULATORY_CARE_PROVIDER_SITE_OTHER): Payer: No Typology Code available for payment source | Admitting: Cardiovascular Disease

## 2014-09-16 VITALS — BP 133/76 | HR 76 | Ht 65.0 in | Wt 225.0 lb

## 2014-09-16 DIAGNOSIS — I639 Cerebral infarction, unspecified: Secondary | ICD-10-CM

## 2014-09-16 DIAGNOSIS — I2583 Coronary atherosclerosis due to lipid rich plaque: Secondary | ICD-10-CM

## 2014-09-16 DIAGNOSIS — I10 Essential (primary) hypertension: Secondary | ICD-10-CM

## 2014-09-16 DIAGNOSIS — I2510 Atherosclerotic heart disease of native coronary artery without angina pectoris: Secondary | ICD-10-CM

## 2014-09-16 DIAGNOSIS — E785 Hyperlipidemia, unspecified: Secondary | ICD-10-CM

## 2014-09-16 NOTE — Assessment & Plan Note (Signed)
Patient was told that he had "a silent myocardial infarction years ago. He does get some dyspnea on exertion but denies chest pain.

## 2014-09-16 NOTE — Assessment & Plan Note (Signed)
On statin therapy, followed by his PCP 

## 2014-09-16 NOTE — Patient Instructions (Signed)
Follow up with Dr Allyson Sabal as needed.    Echocardiogram. Echocardiography is a painless test that uses sound waves to create images of your heart. It provides your doctor with information about the size and shape of your heart and how well your heart's chambers and valves are working. This procedure takes approximately one hour. There are no restrictions for this procedure.   Lexiscan Myoview- this is a test that looks at the blood flow to your heart muscle.  It takes approximately 2 1/2 hours. Please follow instruction sheet, as given.

## 2014-09-16 NOTE — Assessment & Plan Note (Signed)
Control of her medications 

## 2014-09-16 NOTE — Progress Notes (Signed)
09/16/2014 Johnathan Keith   October 22, 1961  [id number]  Primary Physician Caroline More, PA-C Primary Cardiologist: Runell Gess MD [name]   HPI:  Johnathan Keith is a delightful 53 year old mildly overweight married Caucasian male father of 5 children, grandfather and 6 grandchildren referred by Dr. [name] for preoperative surgical clearance before elective right carotid endarterectomy. Primary care physician is Dr. Caroline More and aspirin [facility]. He was in [employer] and is currently out of work because of his ability from his prior TIA. His cardiac risk factor profile is notable for hypertension, diabetes and hyperlipidemia. There is a question of remote myocardial infarction in the past. He did have a TIA back/31/14 and has residual left-sided motor deficits. Recent carotid Dopplers revealed high-grade right internal carotid artery stenosis.   Current Outpatient Prescriptions  Medication Sig Dispense Refill  . amLODipine (NORVASC) 5 MG tablet Take 5 mg by mouth daily.      Marland Kitchen aspirin 325 MG tablet Take 325 mg by mouth daily.      Marland Kitchen atorvastatin (LIPITOR) 40 MG tablet Take 40 mg by mouth at bedtime.      . cholecalciferol (VITAMIN D) 1000 UNITS tablet Take 1,000 Units by mouth daily.      . Dapagliflozin Propanediol (FARXIGA) 10 MG TABS Take 10 mg by mouth.      . dicyclomine (BENTYL) 10 MG capsule Take 10 mg by mouth 2 (two) times daily.      . Diphenoxylate-Atropine (LOMOTIL PO) Take 10 tablets by mouth 2 (two) times daily.      . furosemide (LASIX) 80 MG tablet Take 80 mg by mouth daily.      . insulin NPH-regular Human (NOVOLIN 70/30) (70-30) 100 UNIT/ML injection Inject 50 Units into the skin 2 (two) times daily.      Marland Kitchen lisinopril (PRINIVIL,ZESTRIL) 40 MG tablet Take 40 mg by mouth daily.      . metoprolol tartrate (LOPRESSOR) 25 MG tablet Take 25 mg by mouth 2 (two) times daily.      Marland Kitchen omeprazole (PRILOSEC) 20 MG capsule Take 20 mg  by mouth daily.      . potassium chloride SA (K-DUR,KLOR-CON) 20 MEQ tablet Take 20 mEq by mouth daily.      . sitaGLIPtin (JANUVIA) 100 MG tablet Take 100 mg by mouth daily.      Marland Kitchen testosterone cypionate (DEPOTESTOTERONE CYPIONATE) 200 MG/ML injection As directed      . testosterone enanthate (DELATESTRYL) 200 MG/ML injection Inject into the muscle every 14 (fourteen) days. For IM use only       No current facility-administered medications for this visit.    No Known Allergies  History   Social History  . Marital Status: Married    Spouse Name: N/A    Number of Children: 5  . Years of Education: 12th   Occupational History  . Disabled    Social History Main Topics  . Smoking status: Never Smoker   . Smokeless tobacco: Not on file  . Alcohol Use: No  . Drug Use: No  . Sexual Activity: Yes    Partners: Female   Other Topics Concern  . Not on file   Social History Narrative   Patient lives with his family    Patient is right handed   Patient coffee      Review of Systems: General: negative for chills, fever, night sweats or weight changes.  Cardiovascular: negative for chest pain, dyspnea on exertion, edema, orthopnea, palpitations, paroxysmal  nocturnal dyspnea or shortness of breath Dermatological: negative for rash Respiratory: negative for cough or wheezing Urologic: negative for hematuria Abdominal: negative for nausea, vomiting, diarrhea, bright red blood per rectum, melena, or hematemesis Neurologic: negative for visual changes, syncope, or dizziness All other systems reviewed and are otherwise negative except as noted above.    Blood pressure 133/76, pulse 76, height 5\' 5"  (1.651 m), weight 225 lb (102.059 kg).  General appearance: alert and no distress Neck: no adenopathy, no JVD, supple, symmetrical, trachea midline, thyroid not enlarged, symmetric, no tenderness/mass/nodules and a soft right carotid bruit Lungs: clear to auscultation bilaterally Heart:  regular rate and rhythm, S1, S2 normal, no murmur, click, rub or gallop Extremities: extremities normal, atraumatic, no cyanosis or edema and 2+ pedal pulses bilaterally  EKG normal sinus rhythm of 77 without ST or T wave changes  ASSESSMENT AND PLAN:   CVA (cerebral vascular accident) History of TIA 03/08/13. It left him with mild left-sided deficits. Carotid Dopplers have shown high-grade right internal carotid artery stenosis. He has seen Dr. Gomez CleverlyBrian Chin who plans to perform elective endarterectomy. I'm going to get a 2-D echo and a pharmacologic Myoview stress test for preoperative risk stratification.  HTN (hypertension) Control of her medications  HLD (hyperlipidemia) On statin therapy, followed by his PCP  Coronary artery disease Patient was told that he had "a silent myocardial infarction years ago. He does get some dyspnea on exertion but denies chest pain.      Runell GessJonathan J. Sonni Barse MD FACP,FACC,FAHA, [facility] 09/16/2014 11:05 AM

## 2014-09-16 NOTE — Assessment & Plan Note (Signed)
History of TIA 03/08/13. It left him with mild left-sided deficits. Carotid Dopplers have shown high-grade right internal carotid artery stenosis. He has seen Dr. Gomez Cleverly who plans to perform elective endarterectomy. I'm going to get a 2-D echo and a pharmacologic Myoview stress test for preoperative risk stratification.

## 2014-09-20 ENCOUNTER — Ambulatory Visit (HOSPITAL_BASED_OUTPATIENT_CLINIC_OR_DEPARTMENT_OTHER): Payer: No Typology Code available for payment source | Admitting: Radiology

## 2014-09-20 ENCOUNTER — Ambulatory Visit (HOSPITAL_COMMUNITY)
Admission: RE | Admit: 2014-09-20 | Discharge: 2014-09-20 | Disposition: A | Discharge status: Home / Self Care | Payer: No Typology Code available for payment source | Source: Ambulatory Visit | Attending: Cardiology | Admitting: Cardiology

## 2014-09-20 VITALS — BP 89/56 | Ht 65.0 in | Wt 221.0 lb

## 2014-09-20 DIAGNOSIS — I10 Essential (primary) hypertension: Secondary | ICD-10-CM | POA: Insufficient documentation

## 2014-09-20 DIAGNOSIS — I639 Cerebral infarction, unspecified: Secondary | ICD-10-CM | POA: Diagnosis present

## 2014-09-20 DIAGNOSIS — I2583 Coronary atherosclerosis due to lipid rich plaque: Principal | ICD-10-CM

## 2014-09-20 DIAGNOSIS — I2510 Atherosclerotic heart disease of native coronary artery without angina pectoris: Secondary | ICD-10-CM

## 2014-09-20 DIAGNOSIS — I517 Cardiomegaly: Secondary | ICD-10-CM

## 2014-09-20 DIAGNOSIS — E119 Type 2 diabetes mellitus without complications: Secondary | ICD-10-CM | POA: Diagnosis not present

## 2014-09-20 MED ORDER — TECHNETIUM TC 99M SESTAMIBI GENERIC - CARDIOLITE
33.0000 | Freq: Once | INTRAVENOUS | Status: AC | PRN
  Administered 2014-09-20: 33 via INTRAVENOUS

## 2014-09-20 MED ORDER — TECHNETIUM TC 99M SESTAMIBI GENERIC - CARDIOLITE
11.0000 | Freq: Once | INTRAVENOUS | Status: AC | PRN
  Administered 2014-09-20: 11 via INTRAVENOUS

## 2014-09-20 MED ORDER — REGADENOSON 0.4 MG/5ML IV SOLN
0.4000 mg | Freq: Once | INTRAVENOUS | Status: AC
  Administered 2014-09-20: 0.4 mg via INTRAVENOUS

## 2014-09-20 NOTE — Progress Notes (Signed)
[facility] SITE 3 NUCLEAR MED [address] [place], [place] [address] [phone]    Cardiology Nuclear Med Study  Johnathan Keith is a 53 y.o. male     MRN : [id number]     DOB: [birth year]  Procedure Date: 09/20/2014  Nuclear Med Background Indication for Stress Test:  Evaluation for Ischemia, and Surgical Clearance for  (R) Carotid Endarterectomy on 09-26-2014 by [name], MD History:  CAD Cardiac Risk Factors: Carotid Disease, Hypertension and IDDM   Symptoms:  DOE   Nuclear Pre-Procedure Caffeine/Decaff Intake:  None> 12 hrs NPO After: 8:20pm   Lungs:  clear O2 Sat: 96% on room air. IV 0.9% NS with Angio Cath:  22g  IV Site: R Antecubital x 1, tolerated well IV Started by:  Irean Hong, RN  Chest Size (in):  46 Cup Size: n/a  Height: 5\' 5"  (1.651 m)  Weight:  221 lb (100.245 kg)  BMI:  Body mass index is 36.78 kg/(m^2). Tech Comments:  Fasting CBG was 134 at 0500 today. Fasting CBG was 109 at 0805 on arrival with sprite given. Patient took 1/2 dose of NPH Insulin 70/30, Lopressor, Januvia, and Farxiga this am. Irean Hong, RN.    Nuclear Med Study 1 or 2 day study: 1 day  Stress Test Type:  Eugenie Birks  Reading MD: N/A  Order Authorizing Provider:  Nanetta Batty, MD  Resting Radionuclide: Technetium 43m Sestamibi  Resting Radionuclide Dose: 11.0 mCi   Stress Radionuclide:  Technetium 52m Sestamibi  Stress Radionuclide Dose: 33.0 mCi           Stress Protocol Rest HR: 69 Stress HR: 80  Rest BP: 89/56 Stress BP: 93/63  Exercise Time (min): n/a METS: n/a   Predicted Max HR: 165 bpm % Max HR: 48.48 bpm Rate Pressure Product: 7440   Dose of Adenosine (mg):  n/a Dose of Lexiscan: 0.4 mg  Dose of Atropine (mg): n/a Dose of Dobutamine: n/a mcg/kg/min (at max HR)  Stress Test Technologist: Milana Na, EMT-P  Nuclear Technologist:  Kerby Nora, CNMT     Rest Procedure:  Myocardial perfusion imaging was performed at rest 45 minutes following the  intravenous administration of Technetium 48m Sestamibi. Rest ECG: NSR - Normal EKG  Stress Procedure:  The patient received IV Lexiscan 0.4 mg over 15-seconds.  Technetium 40m Sestamibi injected at 30-seconds. This patient's face got warm with the Lexiscan injection. Quantitative spect images were obtained after a 45 minute delay. Stress ECG: No significant ST segment change suggestive of ischemia.  QPS Raw Data Images:  Acquisition technically good; normal left ventricular size. Stress Images:  There is decreased uptake in the apex. Rest Images:  Normal homogeneous uptake in all areas of the myocardium. Subtraction (SDS):  These findings are consistent with ischemia. Transient Ischemic Dilatation (Normal <1.22):  1.02 Lung/Heart Ratio (Normal <0.45):  0.40  Quantitative Gated Spect Images QGS EDV:  109 ml QGS ESV:  54 ml  Impression Exercise Capacity:  Lexiscan with no exercise. BP Response:  Normal blood pressure response. Clinical Symptoms:  No chest pain or dyspnea. ECG Impression:  No significant ST segment change suggestive of ischemia. Comparison with Prior Nuclear Study: No previous nuclear study performed  Overall Impression:  Low risk stress nuclear study with a small, severe intensity, reversible apical defect consistent with mild apical ischemia.  LV Ejection Fraction: 51%.  LV Wall Motion:  NL LV Function; NL Wall Motion  Olga Millers

## 2014-09-20 NOTE — Progress Notes (Signed)
2D Echo Performed 09/20/2014    Marcena Dias, RCS  

## 2014-09-22 ENCOUNTER — Encounter: Payer: Self-pay | Admitting: Vascular Surgery

## 2014-09-22 ENCOUNTER — Encounter (HOSPITAL_COMMUNITY): Payer: Self-pay

## 2014-09-22 ENCOUNTER — Encounter (HOSPITAL_COMMUNITY)
Admission: RE | Admit: 2014-09-22 | Discharge: 2014-09-22 | Disposition: A | Discharge status: Home / Self Care | Payer: No Typology Code available for payment source | Source: Ambulatory Visit | Attending: Vascular Surgery | Admitting: Vascular Surgery

## 2014-09-22 DIAGNOSIS — Z01818 Encounter for other preprocedural examination: Secondary | ICD-10-CM | POA: Diagnosis present

## 2014-09-22 DIAGNOSIS — I6529 Occlusion and stenosis of unspecified carotid artery: Secondary | ICD-10-CM | POA: Diagnosis not present

## 2014-09-22 DIAGNOSIS — I10 Essential (primary) hypertension: Secondary | ICD-10-CM | POA: Insufficient documentation

## 2014-09-22 DIAGNOSIS — E119 Type 2 diabetes mellitus without complications: Secondary | ICD-10-CM | POA: Diagnosis not present

## 2014-09-22 DIAGNOSIS — E785 Hyperlipidemia, unspecified: Secondary | ICD-10-CM | POA: Insufficient documentation

## 2014-09-22 HISTORY — DX: Nausea with vomiting, unspecified: R11.2

## 2014-09-22 HISTORY — DX: Other specified postprocedural states: Z98.890

## 2014-09-22 LAB — COMPREHENSIVE METABOLIC PANEL
ALT: 29 U/L (ref 0–53)
AST: 18 U/L (ref 0–37)
Albumin: 4.3 g/dL (ref 3.5–5.2)
Alkaline Phosphatase: 61 U/L (ref 39–117)
Anion gap: 15 (ref 5–15)
BUN: 23 mg/dL (ref 6–23)
CO2: 26 mEq/L (ref 19–32)
Calcium: 9.4 mg/dL (ref 8.4–10.5)
Chloride: 99 mEq/L (ref 96–112)
Creatinine, Ser: 0.91 mg/dL (ref 0.50–1.35)
GFR calc Af Amer: >90 mL/min (ref >90)
GFR calc non Af Amer: >90 mL/min (ref >90)
Glucose, Bld: 322 mg/dL — ABNORMAL HIGH (ref 70–99)
Potassium: 4.2 mEq/L (ref 3.7–5.3)
Sodium: 140 mEq/L (ref 137–147)
Total Bilirubin: 0.6 mg/dL (ref 0.3–1.2)
Total Protein: 8.2 g/dL (ref 6.0–8.3)

## 2014-09-22 LAB — URINE MICROSCOPIC-ADD ON: Urine-Other: NONE SEEN

## 2014-09-22 LAB — URINALYSIS, ROUTINE W REFLEX MICROSCOPIC
Bilirubin Urine: NEGATIVE
Glucose, UA: >1000 mg/dL — AB
Hgb urine dipstick: NEGATIVE
Ketones, ur: NEGATIVE mg/dL
Leukocytes, UA: NEGATIVE
Nitrite: NEGATIVE
Protein, ur: NEGATIVE mg/dL
Specific Gravity, Urine: 1.018 (ref 1.005–1.030)
Urobilinogen, UA: 0.2 mg/dL (ref 0.0–1.0)
pH: 5 (ref 5.0–8.0)

## 2014-09-22 LAB — SURGICAL PCR SCREEN
MRSA, PCR: POSITIVE — AB
Staphylococcus aureus: POSITIVE — AB

## 2014-09-22 LAB — CBC
HCT: 43.1 % (ref 39.0–52.0)
Hemoglobin: 14.8 g/dL (ref 13.0–17.0)
MCH: 31 pg (ref 26.0–34.0)
MCHC: 34.3 g/dL (ref 30.0–36.0)
MCV: 90.4 fL (ref 78.0–100.0)
Platelets: 282 10*3/uL (ref 150–400)
RBC: 4.77 MIL/uL (ref 4.22–5.81)
RDW: 12.9 % (ref 11.5–15.5)
WBC: 10 10*3/uL (ref 4.0–10.5)

## 2014-09-22 LAB — PROTIME-INR
INR: 1.1 (ref 0.00–1.49)
Prothrombin Time: 14.3 seconds (ref 11.6–15.2)

## 2014-09-22 LAB — ABO/RH: ABO/RH(D): A POS

## 2014-09-22 LAB — TYPE AND SCREEN
ABO/RH(D): A POS
Antibody Screen: NEGATIVE

## 2014-09-22 LAB — APTT: aPTT: 33 seconds (ref 24–37)

## 2014-09-22 IMAGING — CR DG CHEST 2V
2 series · 2 of 2 positions shown · non-contrast
Comparison: [DATE]

CLINICAL DATA: 53-year-old male preoperative chest x-ray for right
carotid surgery.

EXAM:
CHEST - 2 VIEW

[w chest pa]
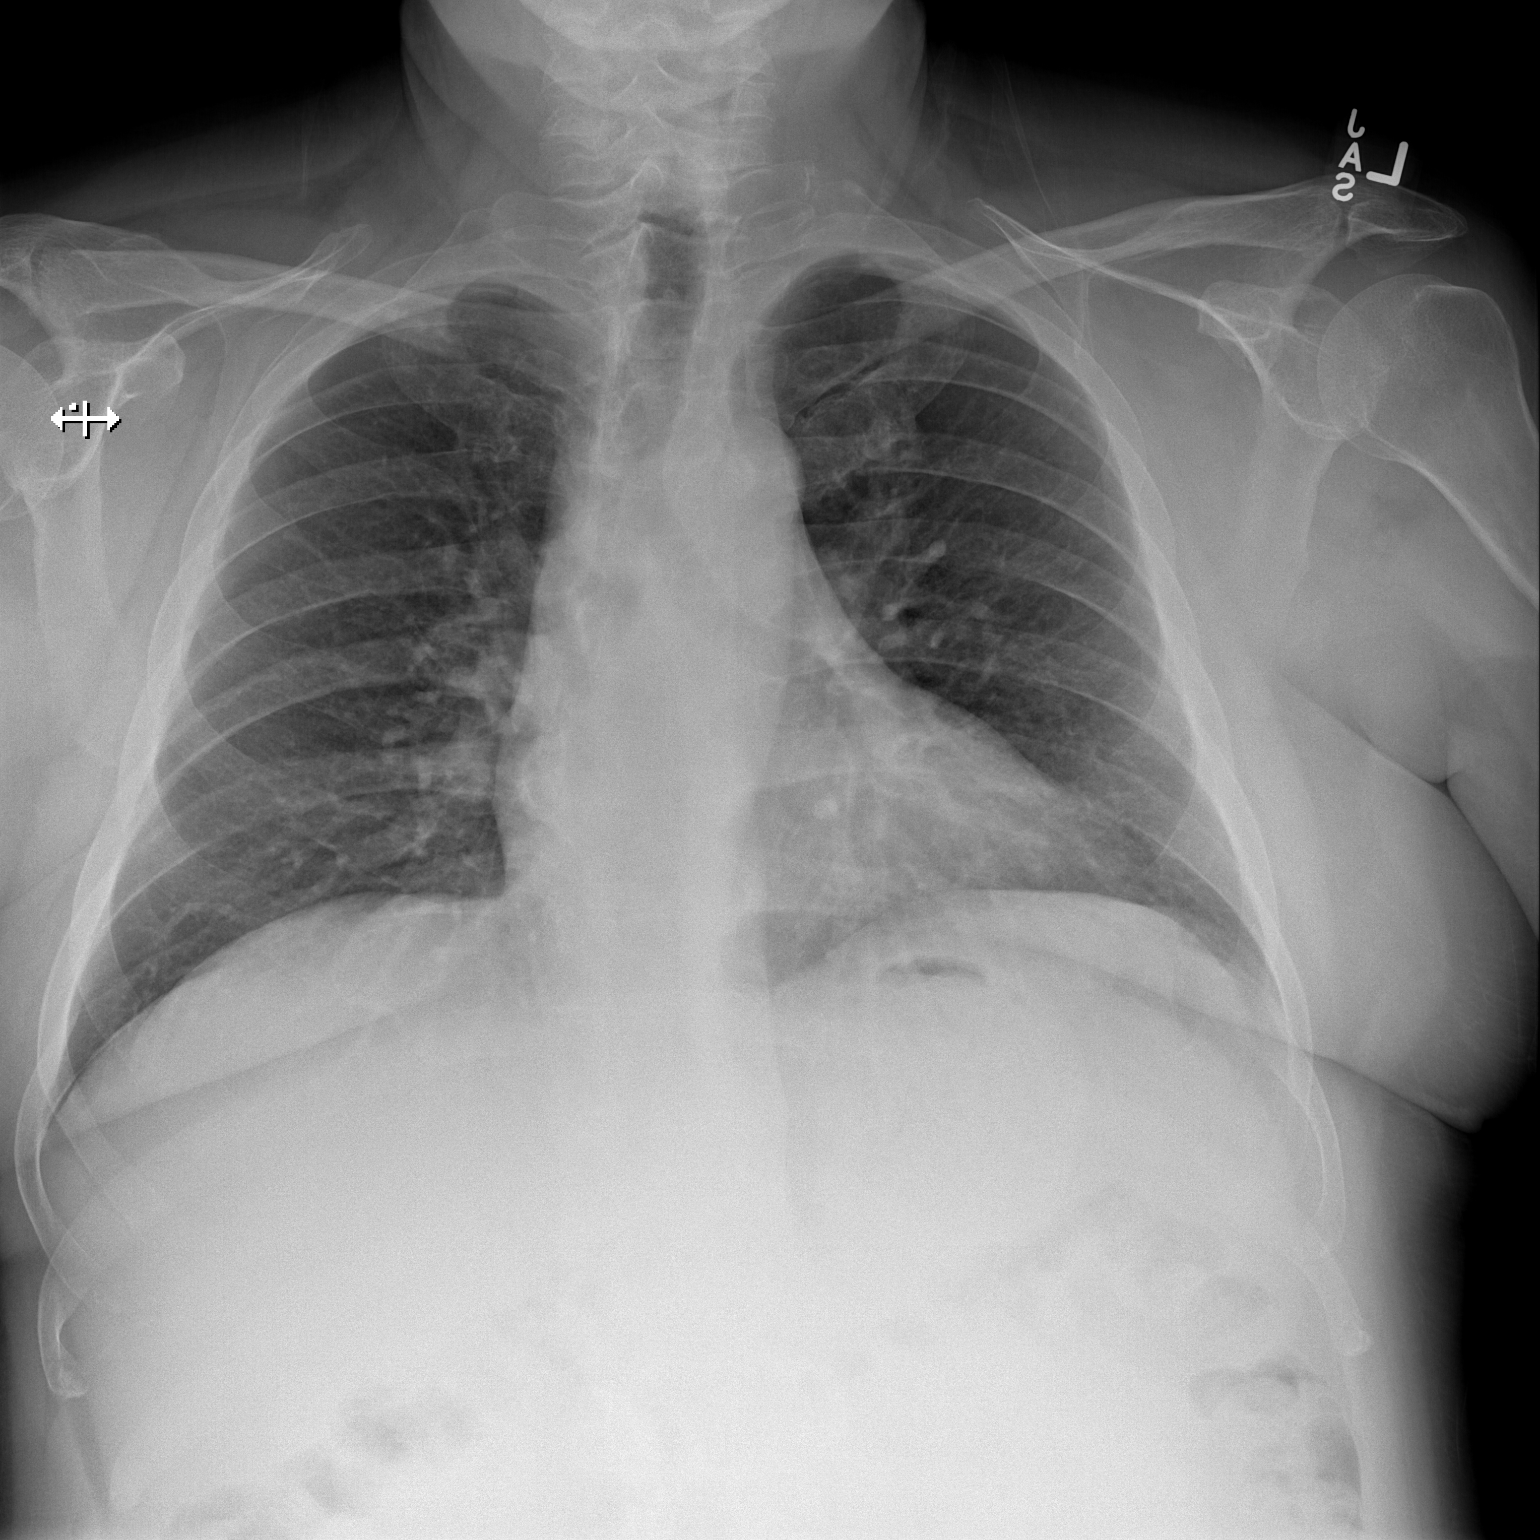

[w chest lat]
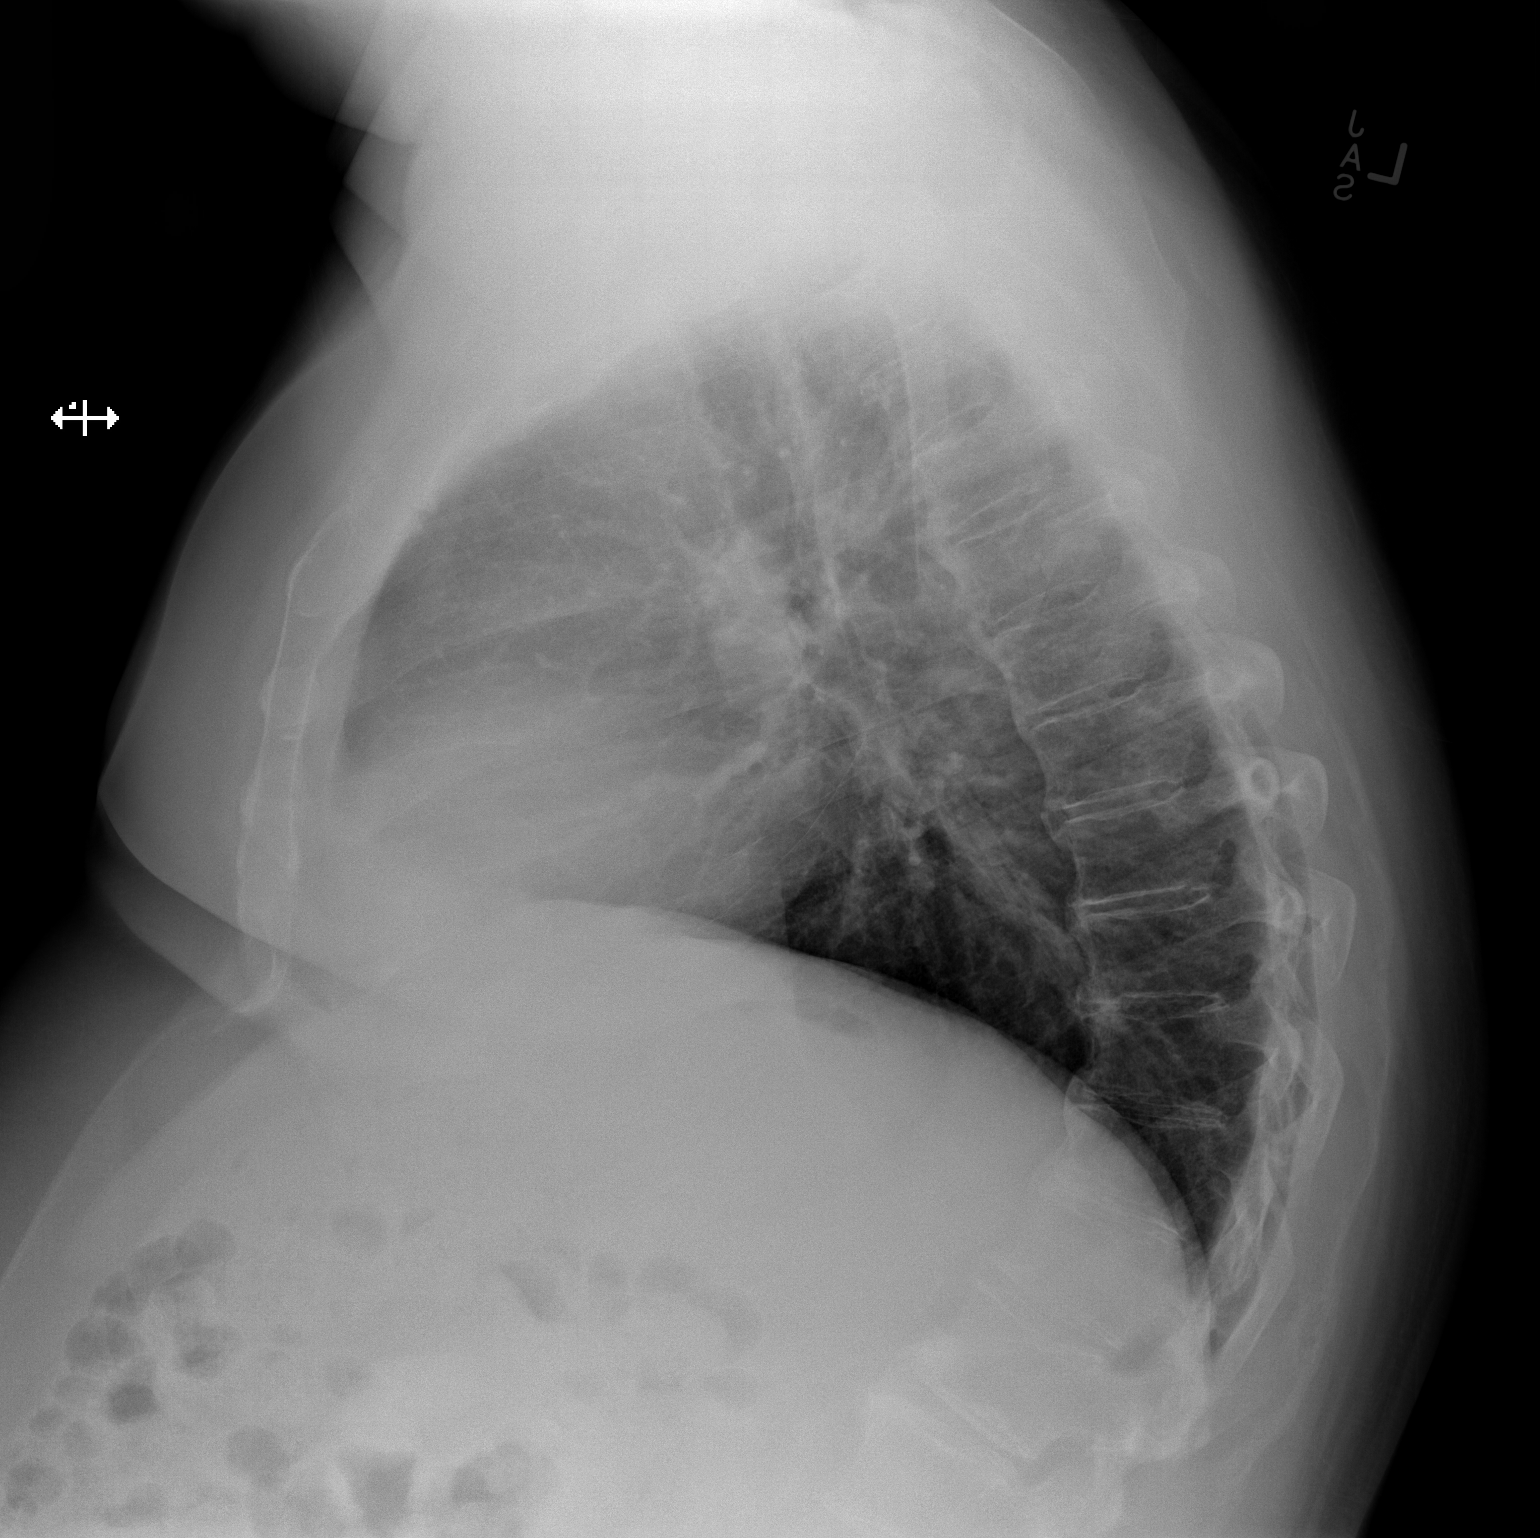

[2 of 2 positions shown; findings below may reference images not displayed]

FINDINGS: Cardiomediastinal silhouette projects within normal limits in size
and contour. No confluent airspace disease, pneumothorax, or pleural
effusion.

No displaced fracture.

Unremarkable appearance of the upper abdomen.

Degenerative changes of the thoracolumbar spine.
IMPRESSION: No radiographic evidence of acute cardiopulmonary disease.

## 2014-09-22 NOTE — Pre-Procedure Instructions (Signed)
Johnathan Keith  09/22/2014   Your procedure is scheduled on:  September 26, 2014   Report to [facility] Admitting at 5:30 AM.  Call this number if you have problems the morning of surgery: [phone]   Remember:   Do not eat food or drink liquids after midnight.   Take these medicines the morning of surgery with A SIP OF WATER: amLODipine (NORVASC),  metoprolol tartrate (LOPRESSOR), omeprazole (PRILOSEC)      Do not wear jewelry.  Do not wear lotions, powders, or colognes. You may wear deodorant.  Men may shave face and neck.  Do not bring valuables to the hospital.  [facility] is not responsible  for any belongings or valuables.               Contacts, dentures or bridgework may not be worn into surgery.  Leave suitcase in the car. After surgery it may be brought to your room.  For patients admitted to the hospital, discharge time is determined by your  treatment team.               Patients discharged the day of surgery will not be allowed to drive home.  Name and phone number of your driver: family/friend      Please read over the following fact sheets that you were given: Pain Booklet, Coughing and Deep Breathing, Blood Transfusion Information and Surgical Site Infection Prevention

## 2014-09-22 NOTE — Progress Notes (Signed)
This patient scored at an elevated risk for obstructive sleep apnea using the STOP BANG TOOL during a pre surgical testing 

## 2014-09-23 ENCOUNTER — Telehealth: Payer: Self-pay | Admitting: *Deleted

## 2014-09-23 ENCOUNTER — Encounter: Payer: Self-pay | Admitting: Vascular Surgery

## 2014-09-23 ENCOUNTER — Ambulatory Visit (INDEPENDENT_AMBULATORY_CARE_PROVIDER_SITE_OTHER): Payer: No Typology Code available for payment source | Admitting: Vascular Surgery

## 2014-09-23 VITALS — BP 100/72 | HR 77 | Resp 16 | Wt 216.7 lb

## 2014-09-23 DIAGNOSIS — I6529 Occlusion and stenosis of unspecified carotid artery: Secondary | ICD-10-CM | POA: Insufficient documentation

## 2014-09-23 DIAGNOSIS — I639 Cerebral infarction, unspecified: Secondary | ICD-10-CM

## 2014-09-23 DIAGNOSIS — IMO0002 Reserved for concepts with insufficient information to code with codable children: Secondary | ICD-10-CM

## 2014-09-23 NOTE — Telephone Encounter (Signed)
Judeth Cornfield is requesting surgical clearance for Mr Basaldua to have his carotid endarterectomy on Monday, Oct 19.  I advised Judeth Cornfield that Dr Allyson Sabal is out of the state and will not return until next week.  Also, Dr Allyson Sabal has not reviewed the echo and nuclear test. I will send this request to the DOD.   Dr Antoine Poche reviewed patient information and cleared patient for surgery.  Letter will be created and sent.

## 2014-09-23 NOTE — Telephone Encounter (Signed)
Letter created by Dr Antoine Poche and signed by him.  I faxed the letter

## 2014-09-23 NOTE — Progress Notes (Signed)
Anesthesia Chart Review:  Patient is a 53 year old male scheduled for right carotid endarterectomy on 09/26/14 by Dr. [name].  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  BMI is consistent with obesity. OSA screening score was 4 or greater.  PCP is Caroline More, [place].  Cardiologist is Dr. Nanetta Batty, seen for a preoperative evaluation on 09/16/14 with stress and echo recommended (see below).  Meds: Farxiga, Lomotil, Norvasc, Vitamin D, ASA 325 mg, Lipitor, Bentyl, Lasix, Novolin 70/30, lisinopril, Lopressor, Prilosec, KCl, Januvia, testosterone cypionate injection.  EKG on 09/16/14 showed NSR. I think it looks like he has mild ( <1 mm) ST elevation in inferior leads with insignificant Q waves. (EKG was already reviewed by Dr. Allyson Sabal and not felt to have any ST/T wave changes.)  Nuclear stress test on 09/20/14 showed: Overall Impression: Low risk stress nuclear study with a small, severe intensity, reversible apical defect consistent with mild apical ischemia.  LV Ejection Fraction: 51%. LV Wall Motion: NL LV Function; NL Wall Motion.  Echo on 09/20/14 showed: LVEF 55-60%, mild LVH, normal wall motion, diastolic dysfunction, elevated LV filling pressure, normal LA size, aortic valve sclerosis with trace to mild AI.  CTA of the neck on 09/14/14 showed: Atherosclerotic disease in the right carotid bifurcation. 15 mm above the bifurcation, focal severe stenosis measuring 75% diameter stenosis. Mild atherosclerotic disease left carotid bifurcation without significant stenosis. Both vertebral arteries widely patent.  CXR on 09/22/14 showed: No radiographic evidence of acute cardiopulmonary disease.  Preoperative labs noted.  Glucose, likely non-fasting, was 322.  UA + glucose.  Otherwise labs unremarkable. He will get a fasting CBG on arrival.  I called Dr.  Hazle Coca office to if he had reviewed recent stress/echo reports, but he is out of the office today.  Case is posted for a first case on Monday.  I discussed with anesthesiologist Dr. Krista Blue who agrees that due to mild ischemia on his stress test, a cardiologist will need to address formal clearance status prior to proceeding.  I notified [facility] RN Judeth Cornfield, and she will discuss with Dr. [name].  I also reported elevated PAT glucose results. (Update:  Cardiologist reviewed tests in Epic and cleared patient for surgery.)  Velna Ochs [facility]/Anesthesiology Phone [phone] 09/23/2014 2:44 PM

## 2014-09-23 NOTE — Progress Notes (Signed)
Established Carotid Patient  History of Present Illness  Johnathan Keith is a 53 y.o. ([birth year]) male who presents with chief complaint: cardiac follow-up and CTA follow-up.  Pt has sx RICA stenosis>80%.  He was sent for preop cardiac clearance: Dr. Hazle Coca office has cleared the patient to proceed.  The patient initially had L side weakness and balance issues.  Most of his sx have resolved.  He had a CTA Neck complete due to carotid duplex suggesting high disease.  Past Medical History  Diagnosis Date  . Hypertension   . Diabetes mellitus without complication   . High cholesterol   . Stroke   . Heart attack   . Diabetic retinopathy   . Peripheral vascular disease     carotid artery disease  . PONV (postoperative nausea and vomiting)     Past Surgical History  Procedure Laterality Date  . Hydrocele excision    . Refractive surgery Right 09-07-14    Diabetic retinopathy   . Circumcision      History   Social History  . Marital Status: Married    Spouse Name: N/A    Number of Children: 5  . Years of Education: 12th   Occupational History  . Disabled    Social History Main Topics  . Smoking status: Never Smoker   . Smokeless tobacco: Not on file  . Alcohol Use: No  . Drug Use: No  . Sexual Activity: Yes    Partners: Female   Other Topics Concern  . Not on file   Social History Narrative   Patient lives with his family    Patient is right handed   Patient coffee     Family History  Problem Relation Age of Onset  . Heart disease Mother   . Diabetes Mother   . Hyperlipidemia Mother   . Hypertension Mother   . Cancer Father     Lung Cancer   . Heart disease Father   . Colon cancer Sister   . Gout Maternal Uncle   . Diabetes Maternal Grandfather   . Gout Brother   . Asthma Brother   . Diabetes Brother     Current Outpatient Prescriptions on File Prior to Visit  Medication Sig Dispense Refill  . amLODipine (NORVASC) 5 MG tablet Take 5 mg by mouth  daily.      Marland Kitchen aspirin 325 MG tablet Take 325 mg by mouth daily.      Marland Kitchen atorvastatin (LIPITOR) 40 MG tablet Take 40 mg by mouth at bedtime.      . Dapagliflozin Propanediol (FARXIGA) 10 MG TABS Take 10 mg by mouth daily.       Marland Kitchen dicyclomine (BENTYL) 10 MG capsule Take 10 mg by mouth 2 (two) times daily.      . Diphenoxylate-Atropine (LOMOTIL PO) Take 1 tablet by mouth 2 (two) times daily as needed (For diarrhea).       . furosemide (LASIX) 80 MG tablet Take 80 mg by mouth daily.      . insulin NPH-regular Human (NOVOLIN 70/30) (70-30) 100 UNIT/ML injection Inject 50 Units into the skin See admin instructions. Take 50 units with breakfast and dinner,  May take 50 units at bedtime as needed      . lisinopril (PRINIVIL,ZESTRIL) 40 MG tablet Take 40 mg by mouth daily.      . metoprolol tartrate (LOPRESSOR) 25 MG tablet Take 25 mg by mouth 2 (two) times daily.      Marland Kitchen omeprazole (PRILOSEC) 20  MG capsule Take 20 mg by mouth daily.      . potassium chloride SA (K-DUR,KLOR-CON) 20 MEQ tablet Take 20 mEq by mouth daily.      . sitaGLIPtin (JANUVIA) 100 MG tablet Take 100 mg by mouth daily.      Marland Kitchen testosterone cypionate (DEPOTESTOTERONE CYPIONATE) 200 MG/ML injection Inject into the muscle every 14 (fourteen) days. As directed      . VITAMIN D, CHOLECALCIFEROL, PO Take 1 tablet by mouth daily.       No current facility-administered medications on file prior to visit.    No Known Allergies  REVIEW OF SYSTEMS: (Positives checked otherwise negative)   CARDIOVASCULAR: []  chest pain, []  chest pressure, []  palpitations, []  shortness of breath when laying flat, [x]  shortness of breath with exertion, []  pain in feet when walking, [x]  pain in feet when laying flat, []  history of blood clot in veins (DVT), []  history of phlebitis, [x]  swelling in legs, []  varicose veins   PULMONARY: []  productive cough, []  asthma, []  wheezing   NEUROLOGIC: []  weakness in arms or legs, []  numbness in arms or legs, []  difficulty  speaking or slurred speech, []  temporary loss of vision in one eye, []  dizziness   HEMATOLOGIC: []  bleeding problems, []  problems with blood clotting too easily   MUSCULOSKEL: []  joint pain, []  joint swelling   GASTROINTEST: []  vomiting blood, []  blood in stool   GENITOURINARY: []  burning with urination, []  blood in urine   PSYCHIATRIC: []  history of major depression   INTEGUMENTARY: []  rashes, []  ulcers   CONSTITUTIONAL: []  fever, []  chills    Physical Examination  Filed Vitals:   09/23/14 1547 09/23/14 1549  BP: 103/67 100/72  Pulse: 76 77  Resp: 16   Weight: 216 lb 11.2 oz (98.294 kg)   SpO2: 98%    Body mass index is 36.06 kg/(m^2).  General: A&O x 3, WD overweight male in NAD   Head: [place]/AT   Ear/Nose/Throat: Hearing grossly intact, nares w/o erythema or drainage, oropharynx w/o Erythema/Exudate   Eyes: Pupils unequal, 4 mm on right, 3 mm on left, but reactive to light. EOMI   Neck: Supple, no carotid bruits.   Pulmonary: Sym exp, good air movt, CTAB, no rales, rhonchi, & wheezing   Cardiac: RRR, Nl S1, S2, no Murmurs, rubs or gallops   Vascular:  Vessel  Right  Left   Radial  Palpable  Palpable   Carotid  Palpable, without bruit  Palpable, without bruit   Aorta  Not palpable  N/A   PT  Not palpable  Not palpable   DP  Palpable  Palpable    Gastrointestinal: soft, NTND, -G/R, - HSM, - masses   Musculoskeletal: M/S 5/5 throughout. Extremities without ischemic changes   Neurologic: CN 2-12 intact. Pain and light touch intact in extremities, Motor exam as listed above   Psychiatric: Judgment intact, Mood & affect appropriate for pt's clinical situation   Dermatologic: See M/S exam for extremity exam, no rashes otherwise noted   Lymph: no palpable LAD   CTA Neck (09/14/14)  Atherosclerotic disease in the right carotid bifurcation. 15 mm above the bifurcation, focal severe stenosis measuring 75% diameter stenosis   Mild atherosclerotic disease left  carotid bifurcation without significant stenosis   Both vertebral arteries widely patent.  Based on my review of this patient's CTA Neck, he as a nearly occluded R ICA that is surgically accessible.  I don't see evidence of disease extending distal into the intracranial  portion of the artery.  Medical Decision Making  Johnathan Keith is a 53 y.o. male who presents with: sx RICA stenosis >80%.   Based on the patient's vascular studies and examination, I have offered the patient: R CEA. I discussed with the patient the risks, benefits, and alternatives to carotid endarterectomy.   I discussed the procedural details of carotid endarterectomy with the patient.   The patient is aware that the risks of carotid endarterectomy include but are not limited to: bleeding, infection, stroke, myocardial infarction, death, cranial nerve injuries both temporary and permanent, neck hematoma, possible airway compromise, labile blood pressure post-operatively, cerebral hyperperfusion syndrome, and possible need for additional interventions in the future.  The patient is aware of the risks and agrees to proceed forward with the procedure.  I discussed in depth with the patient the nature of atherosclerosis, and emphasized the importance of maximal medical management including strict control of blood pressure, blood glucose, and lipid levels, antiplatelet agents, obtaining regular exercise, and cessation of smoking.    The patient is aware that without maximal medical management the underlying atherosclerotic disease process will progress, limiting the benefit of any interventions. The patient is currently on a statin: Lipitor. The patient is currently on an anti-platelet: ASA.  Thank you for allowing us to participate in this patient's care.  Johnathan SakeBrian Goddess Gebbia, MD [facility] Office: [phone] Pager: [phone]  09/23/2014, 4:27 PM

## 2014-09-25 MED ORDER — SODIUM CHLORIDE 0.9 % IV SOLN
INTRAVENOUS | Status: DC
  Administered 2014-09-26 (×2): via INTRAVENOUS

## 2014-09-25 MED ORDER — DEXTROSE 5 % IV SOLN
1.5000 g | INTRAVENOUS | Status: DC
  Filled 2014-09-25: qty 1.5

## 2014-09-26 ENCOUNTER — Inpatient Hospital Stay (HOSPITAL_COMMUNITY): Payer: No Typology Code available for payment source | Admitting: Certified Registered"

## 2014-09-26 ENCOUNTER — Encounter (HOSPITAL_COMMUNITY): Payer: Self-pay | Admitting: *Deleted

## 2014-09-26 ENCOUNTER — Encounter (HOSPITAL_COMMUNITY)
Admission: RE | Disposition: A | Discharge status: Home / Self Care | Payer: Self-pay | Source: Ambulatory Visit | Attending: Vascular Surgery

## 2014-09-26 ENCOUNTER — Encounter (HOSPITAL_COMMUNITY): Payer: No Typology Code available for payment source | Admitting: Vascular Surgery

## 2014-09-26 ENCOUNTER — Inpatient Hospital Stay (HOSPITAL_COMMUNITY)
Admission: RE | Admit: 2014-09-26 | Discharge: 2014-09-28 | DRG: 039 | Disposition: A | Discharge status: Home / Self Care | Payer: No Typology Code available for payment source | Source: Ambulatory Visit | Attending: Vascular Surgery | Admitting: Vascular Surgery

## 2014-09-26 ENCOUNTER — Telehealth: Payer: Self-pay | Admitting: Vascular Surgery

## 2014-09-26 DIAGNOSIS — I252 Old myocardial infarction: Secondary | ICD-10-CM

## 2014-09-26 DIAGNOSIS — I6529 Occlusion and stenosis of unspecified carotid artery: Secondary | ICD-10-CM | POA: Diagnosis present

## 2014-09-26 DIAGNOSIS — Z7982 Long term (current) use of aspirin: Secondary | ICD-10-CM

## 2014-09-26 DIAGNOSIS — Z794 Long term (current) use of insulin: Secondary | ICD-10-CM

## 2014-09-26 DIAGNOSIS — Z8673 Personal history of transient ischemic attack (TIA), and cerebral infarction without residual deficits: Secondary | ICD-10-CM

## 2014-09-26 DIAGNOSIS — R339 Retention of urine, unspecified: Secondary | ICD-10-CM | POA: Diagnosis not present

## 2014-09-26 DIAGNOSIS — IMO0002 Reserved for concepts with insufficient information to code with codable children: Secondary | ICD-10-CM

## 2014-09-26 DIAGNOSIS — G839 Paralytic syndrome, unspecified: Secondary | ICD-10-CM | POA: Diagnosis not present

## 2014-09-26 DIAGNOSIS — E11319 Type 2 diabetes mellitus with unspecified diabetic retinopathy without macular edema: Secondary | ICD-10-CM | POA: Diagnosis present

## 2014-09-26 DIAGNOSIS — I10 Essential (primary) hypertension: Secondary | ICD-10-CM | POA: Diagnosis present

## 2014-09-26 DIAGNOSIS — I6521 Occlusion and stenosis of right carotid artery: Principal | ICD-10-CM | POA: Diagnosis present

## 2014-09-26 HISTORY — PX: ENDARTERECTOMY: SHX5162

## 2014-09-26 HISTORY — DX: Occlusion and stenosis of unspecified carotid artery: I65.29

## 2014-09-26 LAB — CBC
HCT: 37 % — ABNORMAL LOW (ref 39.0–52.0)
Hemoglobin: 12.6 g/dL — ABNORMAL LOW (ref 13.0–17.0)
MCH: 30.7 pg (ref 26.0–34.0)
MCHC: 34.1 g/dL (ref 30.0–36.0)
MCV: 90.2 fL (ref 78.0–100.0)
Platelets: 239 10*3/uL (ref 150–400)
RBC: 4.1 MIL/uL — ABNORMAL LOW (ref 4.22–5.81)
RDW: 13.2 % (ref 11.5–15.5)
WBC: 15.3 10*3/uL — ABNORMAL HIGH (ref 4.0–10.5)

## 2014-09-26 LAB — GLUCOSE, CAPILLARY
Glucose-Capillary: 105 mg/dL — ABNORMAL HIGH (ref 70–99)
Glucose-Capillary: 135 mg/dL — ABNORMAL HIGH (ref 70–99)
Glucose-Capillary: 115 mg/dL — ABNORMAL HIGH (ref 70–99)
Glucose-Capillary: 119 mg/dL — ABNORMAL HIGH (ref 70–99)
Glucose-Capillary: 143 mg/dL — ABNORMAL HIGH (ref 70–99)

## 2014-09-26 LAB — CREATININE, SERUM
Creatinine, Ser: 0.72 mg/dL (ref 0.50–1.35)
GFR calc Af Amer: >90 mL/min (ref >90)
GFR calc non Af Amer: >90 mL/min (ref >90)

## 2014-09-26 SURGERY — ENDARTERECTOMY, CAROTID
Anesthesia: General | Site: Neck | Laterality: Right

## 2014-09-26 MED ORDER — ROCURONIUM BROMIDE 100 MG/10ML IV SOLN
INTRAVENOUS | Status: DC | PRN
  Administered 2014-09-26: 50 mg via INTRAVENOUS

## 2014-09-26 MED ORDER — POTASSIUM CHLORIDE CRYS ER 20 MEQ PO TBCR: 20.0000 meq | EXTENDED_RELEASE_TABLET | Freq: Every day | ORAL | Status: DC | PRN

## 2014-09-26 MED ORDER — GLYCOPYRROLATE 0.2 MG/ML IJ SOLN
INTRAMUSCULAR | Status: AC
  Filled 2014-09-26: qty 2

## 2014-09-26 MED ORDER — DIPHENOXYLATE-ATROPINE 2.5-0.025 MG PO TABS: 1.0000 | ORAL_TABLET | Freq: Two times a day (BID) | ORAL | Status: DC | PRN

## 2014-09-26 MED ORDER — CHLORHEXIDINE GLUCONATE CLOTH 2 % EX PADS
6.0000 | MEDICATED_PAD | Freq: Every day | CUTANEOUS | Status: DC
  Administered 2014-09-27 – 2014-09-28 (×2): 6 via TOPICAL

## 2014-09-26 MED ORDER — OXYCODONE HCL 5 MG/5ML PO SOLN: 5.0000 mg | Freq: Once | ORAL | Status: DC | PRN

## 2014-09-26 MED ORDER — DOCUSATE SODIUM 100 MG PO CAPS
100.0000 mg | ORAL_CAPSULE | Freq: Every day | ORAL | Status: DC
  Administered 2014-09-27 – 2014-09-28 (×2): 100 mg via ORAL
  Filled 2014-09-26 (×2): qty 1

## 2014-09-26 MED ORDER — FUROSEMIDE 80 MG PO TABS
80.0000 mg | ORAL_TABLET | Freq: Every day | ORAL | Status: DC
  Administered 2014-09-26 – 2014-09-28 (×3): 80 mg via ORAL
  Filled 2014-09-26 (×3): qty 1

## 2014-09-26 MED ORDER — PHENYLEPHRINE HCL 10 MG/ML IJ SOLN
10.0000 mg | INTRAVENOUS | Status: DC | PRN
  Administered 2014-09-26: 50 ug/min via INTRAVENOUS

## 2014-09-26 MED ORDER — ONDANSETRON HCL 4 MG/2ML IJ SOLN
INTRAMUSCULAR | Status: DC | PRN
  Administered 2014-09-26: 4 mg via INTRAVENOUS

## 2014-09-26 MED ORDER — PNEUMOCOCCAL VAC POLYVALENT 25 MCG/0.5ML IJ INJ
0.5000 mL | INJECTION | INTRAMUSCULAR | Status: AC
  Administered 2014-09-27: 0.5 mL via INTRAMUSCULAR
  Filled 2014-09-26: qty 0.5

## 2014-09-26 MED ORDER — PROPOFOL 10 MG/ML IV BOLUS
INTRAVENOUS | Status: DC | PRN
  Administered 2014-09-26: 170 mg via INTRAVENOUS

## 2014-09-26 MED ORDER — MIDAZOLAM HCL 2 MG/2ML IJ SOLN
INTRAMUSCULAR | Status: AC
  Filled 2014-09-26: qty 2

## 2014-09-26 MED ORDER — ASPIRIN 325 MG PO TABS
325.0000 mg | ORAL_TABLET | Freq: Every day | ORAL | Status: DC
  Administered 2014-09-27 – 2014-09-28 (×2): 325 mg via ORAL
  Filled 2014-09-26 (×2): qty 1

## 2014-09-26 MED ORDER — HEPARIN SODIUM (PORCINE) 1000 UNIT/ML IJ SOLN
INTRAMUSCULAR | Status: DC | PRN
  Administered 2014-09-26: 8000 [IU] via INTRAVENOUS
  Administered 2014-09-26: 2000 [IU] via INTRAVENOUS

## 2014-09-26 MED ORDER — AMLODIPINE BESYLATE 5 MG PO TABS
5.0000 mg | ORAL_TABLET | Freq: Every day | ORAL | Status: DC
  Administered 2014-09-27 – 2014-09-28 (×2): 5 mg via ORAL
  Filled 2014-09-26 (×2): qty 1

## 2014-09-26 MED ORDER — SODIUM CHLORIDE 0.9 % IR SOLN
Status: DC | PRN
  Administered 2014-09-26: 08:00:00

## 2014-09-26 MED ORDER — HYDROMORPHONE HCL 1 MG/ML IJ SOLN
INTRAMUSCULAR | Status: AC
  Administered 2014-09-26: 0.5 mg via INTRAVENOUS
  Filled 2014-09-26: qty 1

## 2014-09-26 MED ORDER — ONDANSETRON HCL 4 MG/2ML IJ SOLN
INTRAMUSCULAR | Status: AC
  Filled 2014-09-26: qty 2

## 2014-09-26 MED ORDER — ALUM & MAG HYDROXIDE-SIMETH 200-200-20 MG/5ML PO SUSP: 15.0000 mL | ORAL | Status: DC | PRN

## 2014-09-26 MED ORDER — OXYCODONE HCL 5 MG PO TABS
5.0000 mg | ORAL_TABLET | Freq: Four times a day (QID) | ORAL | Status: DC | PRN
  Administered 2014-09-26 – 2014-09-28 (×4): 10 mg via ORAL
  Filled 2014-09-26 (×3): qty 2

## 2014-09-26 MED ORDER — PROTAMINE SULFATE 10 MG/ML IV SOLN
INTRAVENOUS | Status: DC | PRN
  Administered 2014-09-26: 30 mg via INTRAVENOUS

## 2014-09-26 MED ORDER — ACETAMINOPHEN 325 MG PO TABS
325.0000 mg | ORAL_TABLET | ORAL | Status: DC | PRN
  Administered 2014-09-26 – 2014-09-27 (×4): 650 mg via ORAL
  Filled 2014-09-26 (×4): qty 2

## 2014-09-26 MED ORDER — SCOPOLAMINE 1 MG/3DAYS TD PT72: 1.0000 | MEDICATED_PATCH | TRANSDERMAL | Status: DC

## 2014-09-26 MED ORDER — INSULIN ASPART 100 UNIT/ML ~~LOC~~ SOLN
0.0000 [IU] | Freq: Three times a day (TID) | SUBCUTANEOUS | Status: DC
  Administered 2014-09-27: 2 [IU] via SUBCUTANEOUS
  Administered 2014-09-27 (×2): 3 [IU] via SUBCUTANEOUS
  Administered 2014-09-28: 5 [IU] via SUBCUTANEOUS

## 2014-09-26 MED ORDER — SCOPOLAMINE 1 MG/3DAYS TD PT72
MEDICATED_PATCH | TRANSDERMAL | Status: DC | PRN
  Administered 2014-09-26: 1 via TRANSDERMAL

## 2014-09-26 MED ORDER — LINAGLIPTIN 5 MG PO TABS
5.0000 mg | ORAL_TABLET | Freq: Every day | ORAL | Status: DC
Start: 2014-09-27 — End: 2014-09-28
  Administered 2014-09-27 – 2014-09-28 (×2): 5 mg via ORAL
  Filled 2014-09-26 (×2): qty 1

## 2014-09-26 MED ORDER — LIDOCAINE HCL (PF) 1 % IJ SOLN
INTRAMUSCULAR | Status: AC
  Filled 2014-09-26: qty 30

## 2014-09-26 MED ORDER — SODIUM CHLORIDE 0.9 % IV SOLN
500.0000 mL | Freq: Once | INTRAVENOUS | Status: AC | PRN
  Administered 2014-09-26: 500 mL via INTRAVENOUS

## 2014-09-26 MED ORDER — MUPIROCIN 2 % EX OINT
TOPICAL_OINTMENT | Freq: Two times a day (BID) | CUTANEOUS | Status: DC
  Administered 2014-09-27: 21:00:00 via TOPICAL

## 2014-09-26 MED ORDER — PROMETHAZINE HCL 25 MG/ML IJ SOLN: 6.2500 mg | INTRAMUSCULAR | Status: DC | PRN

## 2014-09-26 MED ORDER — PHENYLEPHRINE 40 MCG/ML (10ML) SYRINGE FOR IV PUSH (FOR BLOOD PRESSURE SUPPORT)
PREFILLED_SYRINGE | INTRAVENOUS | Status: AC
  Filled 2014-09-26: qty 10

## 2014-09-26 MED ORDER — HYDRALAZINE HCL 20 MG/ML IJ SOLN: 10.0000 mg | INTRAMUSCULAR | Status: DC | PRN

## 2014-09-26 MED ORDER — GLYCOPYRROLATE 0.2 MG/ML IJ SOLN
INTRAMUSCULAR | Status: DC | PRN
  Administered 2014-09-26: 0.6 mg via INTRAVENOUS

## 2014-09-26 MED ORDER — SUCCINYLCHOLINE CHLORIDE 20 MG/ML IJ SOLN
INTRAMUSCULAR | Status: AC
  Filled 2014-09-26: qty 1

## 2014-09-26 MED ORDER — ARTIFICIAL TEARS OP OINT
TOPICAL_OINTMENT | OPHTHALMIC | Status: AC
  Filled 2014-09-26: qty 3.5

## 2014-09-26 MED ORDER — DOPAMINE-DEXTROSE 3.2-5 MG/ML-% IV SOLN: 3.0000 ug/kg/min | INTRAVENOUS | Status: DC

## 2014-09-26 MED ORDER — ONDANSETRON HCL 4 MG/2ML IJ SOLN
4.0000 mg | Freq: Four times a day (QID) | INTRAMUSCULAR | Status: DC | PRN
  Administered 2014-09-26 – 2014-09-27 (×2): 4 mg via INTRAVENOUS
  Filled 2014-09-26 (×2): qty 2

## 2014-09-26 MED ORDER — LISINOPRIL 40 MG PO TABS
40.0000 mg | ORAL_TABLET | Freq: Every day | ORAL | Status: DC
  Administered 2014-09-27 – 2014-09-28 (×2): 40 mg via ORAL
  Filled 2014-09-26 (×2): qty 1

## 2014-09-26 MED ORDER — HEPARIN SODIUM (PORCINE) 1000 UNIT/ML IJ SOLN
INTRAMUSCULAR | Status: AC
  Filled 2014-09-26: qty 1

## 2014-09-26 MED ORDER — ACETAMINOPHEN 650 MG RE SUPP: 325.0000 mg | RECTAL | Status: DC | PRN

## 2014-09-26 MED ORDER — NEOSTIGMINE METHYLSULFATE 10 MG/10ML IV SOLN
INTRAVENOUS | Status: DC | PRN
  Administered 2014-09-26: 4 mg via INTRAVENOUS

## 2014-09-26 MED ORDER — METOPROLOL TARTRATE 1 MG/ML IV SOLN: 2.0000 mg | INTRAVENOUS | Status: DC | PRN

## 2014-09-26 MED ORDER — ARTIFICIAL TEARS OP OINT
TOPICAL_OINTMENT | OPHTHALMIC | Status: DC | PRN
  Administered 2014-09-26: 1 via OPHTHALMIC

## 2014-09-26 MED ORDER — MUPIROCIN 2 % EX OINT
1.0000 "application " | TOPICAL_OINTMENT | Freq: Two times a day (BID) | CUTANEOUS | Status: DC
  Administered 2014-09-26 – 2014-09-27 (×3): 1 via NASAL
  Filled 2014-09-26: qty 22

## 2014-09-26 MED ORDER — THROMBIN 20000 UNITS EX SOLR
CUTANEOUS | Status: AC
  Filled 2014-09-26: qty 20000

## 2014-09-26 MED ORDER — THROMBIN 20000 UNITS EX SOLR
CUTANEOUS | Status: DC | PRN
  Administered 2014-09-26: 10:00:00 via TOPICAL

## 2014-09-26 MED ORDER — FENTANYL CITRATE 0.05 MG/ML IJ SOLN
INTRAMUSCULAR | Status: AC
  Filled 2014-09-26: qty 5

## 2014-09-26 MED ORDER — LIDOCAINE HCL (CARDIAC) 20 MG/ML IV SOLN
INTRAVENOUS | Status: AC
  Filled 2014-09-26: qty 10

## 2014-09-26 MED ORDER — TAMSULOSIN HCL 0.4 MG PO CAPS
0.4000 mg | ORAL_CAPSULE | Freq: Every day | ORAL | Status: DC
  Administered 2014-09-26 – 2014-09-28 (×3): 0.4 mg via ORAL
  Filled 2014-09-26 (×3): qty 1

## 2014-09-26 MED ORDER — CHLORHEXIDINE GLUCONATE CLOTH 2 % EX PADS: 6.0000 | MEDICATED_PAD | Freq: Once | CUTANEOUS | Status: DC

## 2014-09-26 MED ORDER — POTASSIUM CHLORIDE CRYS ER 20 MEQ PO TBCR
20.0000 meq | EXTENDED_RELEASE_TABLET | Freq: Every day | ORAL | Status: DC
  Administered 2014-09-27 – 2014-09-28 (×2): 20 meq via ORAL
  Filled 2014-09-26 (×3): qty 1

## 2014-09-26 MED ORDER — HYDROMORPHONE HCL 1 MG/ML IJ SOLN
0.2500 mg | INTRAMUSCULAR | Status: DC | PRN
  Administered 2014-09-26 (×4): 0.5 mg via INTRAVENOUS

## 2014-09-26 MED ORDER — OXYCODONE HCL 5 MG PO TABS: 5.0000 mg | ORAL_TABLET | Freq: Once | ORAL | Status: DC | PRN

## 2014-09-26 MED ORDER — PROPOFOL 10 MG/ML IV BOLUS
INTRAVENOUS | Status: AC
  Filled 2014-09-26: qty 20

## 2014-09-26 MED ORDER — EPHEDRINE SULFATE 50 MG/ML IJ SOLN
INTRAMUSCULAR | Status: AC
  Filled 2014-09-26: qty 1

## 2014-09-26 MED ORDER — FENTANYL CITRATE 0.05 MG/ML IJ SOLN
INTRAMUSCULAR | Status: DC | PRN
  Administered 2014-09-26: 50 ug via INTRAVENOUS
  Administered 2014-09-26: 100 ug via INTRAVENOUS

## 2014-09-26 MED ORDER — OXYCODONE HCL 5 MG PO TABS
ORAL_TABLET | ORAL | Status: AC
  Administered 2014-09-26: 10 mg via ORAL
  Filled 2014-09-26: qty 2

## 2014-09-26 MED ORDER — ENOXAPARIN SODIUM 30 MG/0.3ML ~~LOC~~ SOLN
30.0000 mg | SUBCUTANEOUS | Status: DC
  Administered 2014-09-27: 30 mg via SUBCUTANEOUS
  Filled 2014-09-26 (×2): qty 0.3

## 2014-09-26 MED ORDER — SODIUM CHLORIDE 0.9 % IJ SOLN
INTRAMUSCULAR | Status: AC
  Filled 2014-09-26: qty 20

## 2014-09-26 MED ORDER — SODIUM CHLORIDE 0.9 % IV SOLN: INTRAVENOUS | Status: DC

## 2014-09-26 MED ORDER — ROCURONIUM BROMIDE 50 MG/5ML IV SOLN
INTRAVENOUS | Status: AC
  Filled 2014-09-26: qty 1

## 2014-09-26 MED ORDER — OXYCODONE HCL 5 MG PO TABS: 5.0000 mg | ORAL_TABLET | Freq: Four times a day (QID) | ORAL | Status: DC | PRN

## 2014-09-26 MED ORDER — INFLUENZA VAC SPLIT QUAD 0.5 ML IM SUSY
0.5000 mL | PREFILLED_SYRINGE | INTRAMUSCULAR | Status: AC
  Administered 2014-09-27: 0.5 mL via INTRAMUSCULAR
  Filled 2014-09-26: qty 0.5

## 2014-09-26 MED ORDER — ATORVASTATIN CALCIUM 40 MG PO TABS
40.0000 mg | ORAL_TABLET | Freq: Every day | ORAL | Status: DC
  Administered 2014-09-26 – 2014-09-27 (×2): 40 mg via ORAL
  Filled 2014-09-26 (×3): qty 1

## 2014-09-26 MED ORDER — DEXTROSE 5 % IV SOLN
1.5000 g | Freq: Two times a day (BID) | INTRAVENOUS | Status: AC
  Administered 2014-09-26 – 2014-09-27 (×2): 1.5 g via INTRAVENOUS
  Filled 2014-09-26 (×2): qty 1.5

## 2014-09-26 MED ORDER — GUAIFENESIN-DM 100-10 MG/5ML PO SYRP: 15.0000 mL | ORAL_SOLUTION | ORAL | Status: DC | PRN

## 2014-09-26 MED ORDER — MORPHINE SULFATE 2 MG/ML IJ SOLN
2.0000 mg | INTRAMUSCULAR | Status: DC | PRN
  Administered 2014-09-26 – 2014-09-27 (×3): 2 mg via INTRAVENOUS
  Filled 2014-09-26 (×2): qty 1

## 2014-09-26 MED ORDER — MORPHINE SULFATE 2 MG/ML IJ SOLN
INTRAMUSCULAR | Status: AC
  Filled 2014-09-26: qty 1

## 2014-09-26 MED ORDER — LIDOCAINE HCL (CARDIAC) 20 MG/ML IV SOLN
INTRAVENOUS | Status: DC | PRN
  Administered 2014-09-26: 40 mg via INTRAVENOUS

## 2014-09-26 MED ORDER — DAPAGLIFLOZIN PROPANEDIOL 10 MG PO TABS: 10.0000 mg | ORAL_TABLET | Freq: Every day | ORAL | Status: DC

## 2014-09-26 MED ORDER — PANTOPRAZOLE SODIUM 40 MG PO TBEC
40.0000 mg | DELAYED_RELEASE_TABLET | Freq: Every day | ORAL | Status: DC
  Administered 2014-09-27 – 2014-09-28 (×2): 40 mg via ORAL
  Filled 2014-09-26 (×2): qty 1

## 2014-09-26 MED ORDER — METOPROLOL TARTRATE 25 MG PO TABS
25.0000 mg | ORAL_TABLET | Freq: Two times a day (BID) | ORAL | Status: DC
  Administered 2014-09-26 – 2014-09-28 (×4): 25 mg via ORAL
  Filled 2014-09-26 (×5): qty 1

## 2014-09-26 MED ORDER — NEOSTIGMINE METHYLSULFATE 10 MG/10ML IV SOLN
INTRAVENOUS | Status: AC
  Filled 2014-09-26: qty 1

## 2014-09-26 MED ORDER — PHENOL 1.4 % MT LIQD: 1.0000 | OROMUCOSAL | Status: DC | PRN

## 2014-09-26 MED ORDER — DICYCLOMINE HCL 10 MG PO CAPS
10.0000 mg | ORAL_CAPSULE | Freq: Two times a day (BID) | ORAL | Status: DC
  Administered 2014-09-27 – 2014-09-28 (×3): 10 mg via ORAL
  Filled 2014-09-26 (×4): qty 1

## 2014-09-26 MED ORDER — 0.9 % SODIUM CHLORIDE (POUR BTL) OPTIME
TOPICAL | Status: DC | PRN
  Administered 2014-09-26: 1000 mL

## 2014-09-26 SURGICAL SUPPLY — 52 items
BAG DECANTER FOR FLEXI CONT (MISCELLANEOUS) IMPLANT
BLADE SURG 10 STRL SS (BLADE) ×3 IMPLANT
CANISTER SUCTION 2500CC (MISCELLANEOUS) ×3 IMPLANT
CATH ROBINSON RED A/P 18FR (CATHETERS) ×3 IMPLANT
CATH SUCT 10FR WHISTLE TIP (CATHETERS) IMPLANT
CLIP TI MEDIUM 6 (CLIP) ×3 IMPLANT
CLIP TI WIDE RED SMALL 6 (CLIP) ×3 IMPLANT
COVER PROBE W GEL 5X96 (DRAPES) ×3 IMPLANT
CRADLE DONUT ADULT HEAD (MISCELLANEOUS) ×3 IMPLANT
DERMABOND ADHESIVE PROPEN (GAUZE/BANDAGES/DRESSINGS) ×2
DERMABOND ADVANCED (GAUZE/BANDAGES/DRESSINGS) ×2
DERMABOND ADVANCED .7 DNX12 (GAUZE/BANDAGES/DRESSINGS) ×1 IMPLANT
DERMABOND ADVANCED .7 DNX6 (GAUZE/BANDAGES/DRESSINGS) ×1 IMPLANT
ELECT REM PT RETURN 9FT ADLT (ELECTROSURGICAL) ×3
ELECTRODE REM PT RTRN 9FT ADLT (ELECTROSURGICAL) ×1 IMPLANT
GAUZE SPONGE 4X4 12PLY STRL (GAUZE/BANDAGES/DRESSINGS) ×6 IMPLANT
GLOVE BIO SURGEON STRL SZ 6.5 (GLOVE) ×8 IMPLANT
GLOVE BIO SURGEON STRL SZ7 (GLOVE) ×3 IMPLANT
GLOVE BIO SURGEON STRL SZ7.5 (GLOVE) ×3 IMPLANT
GLOVE BIO SURGEONS STRL SZ 6.5 (GLOVE) ×4
GLOVE BIOGEL PI IND STRL 7.5 (GLOVE) ×1 IMPLANT
GLOVE BIOGEL PI INDICATOR 7.5 (GLOVE) ×2
GLOVE SURG SS PI 7.0 STRL IVOR (GLOVE) ×6 IMPLANT
GLOVE SURG SS PI 7.5 STRL IVOR (GLOVE) ×3 IMPLANT
GOWN STRL REUS W/ TWL LRG LVL3 (GOWN DISPOSABLE) ×6 IMPLANT
GOWN STRL REUS W/TWL LRG LVL3 (GOWN DISPOSABLE) ×12
IV ADAPTER SYR DOUBLE MALE LL (MISCELLANEOUS) ×3 IMPLANT
KIT BASIN OR (CUSTOM PROCEDURE TRAY) ×3 IMPLANT
KIT ROOM TURNOVER OR (KITS) ×3 IMPLANT
NS IRRIG 1000ML POUR BTL (IV SOLUTION) ×9 IMPLANT
PACK CAROTID (CUSTOM PROCEDURE TRAY) ×3 IMPLANT
PAD ARMBOARD 7.5X6 YLW CONV (MISCELLANEOUS) ×6 IMPLANT
PATCH VASCULAR VASCU GUARD 1X6 (Vascular Products) ×3 IMPLANT
SET COLLECT BLD 21X3/4 12 PB (MISCELLANEOUS) ×3 IMPLANT
SHUNT CAROTID BYPASS 10 (VASCULAR PRODUCTS) ×3 IMPLANT
SHUNT CAROTID BYPASS 12FRX15.5 (VASCULAR PRODUCTS) IMPLANT
SPONGE INTESTINAL PEANUT (DISPOSABLE) ×3 IMPLANT
SPONGE SURGIFOAM ABS GEL 100 (HEMOSTASIS) IMPLANT
STOPCOCK 4 WAY LG BORE MALE ST (IV SETS) ×3 IMPLANT
SUT ETHILON 3 0 PS 1 (SUTURE) IMPLANT
SUT MNCRL AB 4-0 PS2 18 (SUTURE) ×3 IMPLANT
SUT PROLENE 6 0 BV (SUTURE) ×18 IMPLANT
SUT PROLENE 7 0 BV 1 (SUTURE) IMPLANT
SUT SILK 3 0 TIES 17X18 (SUTURE)
SUT SILK 3-0 18XBRD TIE BLK (SUTURE) IMPLANT
SUT VIC AB 3-0 SH 27 (SUTURE) ×2
SUT VIC AB 3-0 SH 27X BRD (SUTURE) ×1 IMPLANT
SYR TB 1ML LUER SLIP (SYRINGE) IMPLANT
SYSTEM CHEST DRAIN TLS 7FR (DRAIN) IMPLANT
TUBING ART PRESS 48 MALE/FEM (TUBING) ×3 IMPLANT
TUBING EXTENTION W/L.L. (IV SETS) ×3 IMPLANT
WATER STERILE IRR 1000ML POUR (IV SOLUTION) ×3 IMPLANT

## 2014-09-26 NOTE — Progress Notes (Signed)
Patient unable to void, bladder scanned for amount greater then , called dr [name] new orders rec'd to insert foley catheter due to acute urinary retention.

## 2014-09-26 NOTE — H&P (View-Only) (Signed)
Established Carotid Patient  History of Present Illness  Johnathan Keith is a 53 y.o. ([birth year]) male who presents with chief complaint: cardiac follow-up and CTA follow-up.  Pt has sx RICA stenosis>80%.  He was sent for preop cardiac clearance: Dr. Hazle Coca office has cleared the patient to proceed.  The patient initially had L side weakness and balance issues.  Most of his sx have resolved.  He had a CTA Neck complete due to carotid duplex suggesting high disease.  Past Medical History  Diagnosis Date  . Hypertension   . Diabetes mellitus without complication   . High cholesterol   . Stroke   . Heart attack   . Diabetic retinopathy   . Peripheral vascular disease     carotid artery disease  . PONV (postoperative nausea and vomiting)     Past Surgical History  Procedure Laterality Date  . Hydrocele excision    . Refractive surgery Right 09-07-14    Diabetic retinopathy   . Circumcision      History   Social History  . Marital Status: Married    Spouse Name: N/A    Number of Children: 5  . Years of Education: 12th   Occupational History  . Disabled    Social History Main Topics  . Smoking status: Never Smoker   . Smokeless tobacco: Not on file  . Alcohol Use: No  . Drug Use: No  . Sexual Activity: Yes    Partners: Female   Other Topics Concern  . Not on file   Social History Narrative   Patient lives with his family    Patient is right handed   Patient coffee     Family History  Problem Relation Age of Onset  . Heart disease Mother   . Diabetes Mother   . Hyperlipidemia Mother   . Hypertension Mother   . Cancer Father     Lung Cancer   . Heart disease Father   . Colon cancer Sister   . Gout Maternal Uncle   . Diabetes Maternal Grandfather   . Gout Brother   . Asthma Brother   . Diabetes Brother     Current Outpatient Prescriptions on File Prior to Visit  Medication Sig Dispense Refill  . amLODipine (NORVASC) 5 MG tablet Take 5 mg by mouth  daily.      Marland Kitchen aspirin 325 MG tablet Take 325 mg by mouth daily.      Marland Kitchen atorvastatin (LIPITOR) 40 MG tablet Take 40 mg by mouth at bedtime.      . Dapagliflozin Propanediol (FARXIGA) 10 MG TABS Take 10 mg by mouth daily.       Marland Kitchen dicyclomine (BENTYL) 10 MG capsule Take 10 mg by mouth 2 (two) times daily.      . Diphenoxylate-Atropine (LOMOTIL PO) Take 1 tablet by mouth 2 (two) times daily as needed (For diarrhea).       . furosemide (LASIX) 80 MG tablet Take 80 mg by mouth daily.      . insulin NPH-regular Human (NOVOLIN 70/30) (70-30) 100 UNIT/ML injection Inject 50 Units into the skin See admin instructions. Take 50 units with breakfast and dinner,  May take 50 units at bedtime as needed      . lisinopril (PRINIVIL,ZESTRIL) 40 MG tablet Take 40 mg by mouth daily.      . metoprolol tartrate (LOPRESSOR) 25 MG tablet Take 25 mg by mouth 2 (two) times daily.      Marland Kitchen omeprazole (PRILOSEC) 20  MG capsule Take 20 mg by mouth daily.      . potassium chloride SA (K-DUR,KLOR-CON) 20 MEQ tablet Take 20 mEq by mouth daily.      . sitaGLIPtin (JANUVIA) 100 MG tablet Take 100 mg by mouth daily.      Marland Kitchen testosterone cypionate (DEPOTESTOTERONE CYPIONATE) 200 MG/ML injection Inject into the muscle every 14 (fourteen) days. As directed      . VITAMIN D, CHOLECALCIFEROL, PO Take 1 tablet by mouth daily.       No current facility-administered medications on file prior to visit.    No Known Allergies  REVIEW OF SYSTEMS: (Positives checked otherwise negative)   CARDIOVASCULAR: []  chest pain, []  chest pressure, []  palpitations, []  shortness of breath when laying flat, [x]  shortness of breath with exertion, []  pain in feet when walking, [x]  pain in feet when laying flat, []  history of blood clot in veins (DVT), []  history of phlebitis, [x]  swelling in legs, []  varicose veins   PULMONARY: []  productive cough, []  asthma, []  wheezing   NEUROLOGIC: []  weakness in arms or legs, []  numbness in arms or legs, []  difficulty  speaking or slurred speech, []  temporary loss of vision in one eye, []  dizziness   HEMATOLOGIC: []  bleeding problems, []  problems with blood clotting too easily   MUSCULOSKEL: []  joint pain, []  joint swelling   GASTROINTEST: []  vomiting blood, []  blood in stool   GENITOURINARY: []  burning with urination, []  blood in urine   PSYCHIATRIC: []  history of major depression   INTEGUMENTARY: []  rashes, []  ulcers   CONSTITUTIONAL: []  fever, []  chills    Physical Examination  Filed Vitals:   09/23/14 1547 09/23/14 1549  BP: 103/67 100/72  Pulse: 76 77  Resp: 16   Weight: 216 lb 11.2 oz (98.294 kg)   SpO2: 98%    Body mass index is 36.06 kg/(m^2).  General: A&O x 3, WD overweight male in NAD   Head: [place]/AT   Ear/Nose/Throat: Hearing grossly intact, nares w/o erythema or drainage, oropharynx w/o Erythema/Exudate   Eyes: Pupils unequal, 4 mm on right, 3 mm on left, but reactive to light. EOMI   Neck: Supple, no carotid bruits.   Pulmonary: Sym exp, good air movt, CTAB, no rales, rhonchi, & wheezing   Cardiac: RRR, Nl S1, S2, no Murmurs, rubs or gallops   Vascular:  Vessel  Right  Left   Radial  Palpable  Palpable   Carotid  Palpable, without bruit  Palpable, without bruit   Aorta  Not palpable  N/A   PT  Not palpable  Not palpable   DP  Palpable  Palpable    Gastrointestinal: soft, NTND, -G/R, - HSM, - masses   Musculoskeletal: M/S 5/5 throughout. Extremities without ischemic changes   Neurologic: CN 2-12 intact. Pain and light touch intact in extremities, Motor exam as listed above   Psychiatric: Judgment intact, Mood & affect appropriate for pt's clinical situation   Dermatologic: See M/S exam for extremity exam, no rashes otherwise noted   Lymph: no palpable LAD   CTA Neck (09/14/14)  Atherosclerotic disease in the right carotid bifurcation. 15 mm above the bifurcation, focal severe stenosis measuring 75% diameter stenosis   Mild atherosclerotic disease left  carotid bifurcation without significant stenosis   Both vertebral arteries widely patent.  Based on my review of this patient's CTA Neck, he as a nearly occluded R ICA that is surgically accessible.  I don't see evidence of disease extending distal into the intracranial  portion of the artery.  Medical Decision Making  Serafina RoyalsGrady Plaisted is a 53 y.o. male who presents with: sx RICA stenosis >80%.   Based on the patient's vascular studies and examination, I have offered the patient: R CEA. I discussed with the patient the risks, benefits, and alternatives to carotid endarterectomy.   I discussed the procedural details of carotid endarterectomy with the patient.   The patient is aware that the risks of carotid endarterectomy include but are not limited to: bleeding, infection, stroke, myocardial infarction, death, cranial nerve injuries both temporary and permanent, neck hematoma, possible airway compromise, labile blood pressure post-operatively, cerebral hyperperfusion syndrome, and possible need for additional interventions in the future.  The patient is aware of the risks and agrees to proceed forward with the procedure.  I discussed in depth with the patient the nature of atherosclerosis, and emphasized the importance of maximal medical management including strict control of blood pressure, blood glucose, and lipid levels, antiplatelet agents, obtaining regular exercise, and cessation of smoking.    The patient is aware that without maximal medical management the underlying atherosclerotic disease process will progress, limiting the benefit of any interventions. The patient is currently on a statin: Lipitor. The patient is currently on an anti-platelet: ASA.  Thank you for allowing us to participate in this patient's care.  Leonides SakeBrian Chen, MD [facility] Office: [phone] Pager: [phone]  09/23/2014, 4:27 PM

## 2014-09-26 NOTE — Interval H&P Note (Signed)
History and Physical Interval Note:  09/26/2014 7:16 AM  Johnathan Keith  has presented today for surgery, with the diagnosis of Carotid stenosis, right   The various methods of treatment have been discussed with the patient and family. After consideration of risks, benefits and other options for treatment, the patient has consented to  Procedure(s): ENDARTERECTOMY CAROTID (Right) as a surgical intervention .  The patient's history has been reviewed, patient examined, no change in status, stable for surgery.  I have reviewed the patient's chart and labs.  Questions were answered to the patient's satisfaction.     Chanler Schreiter LIANG-YU

## 2014-09-26 NOTE — Op Note (Signed)
OPERATIVE NOTE  PROCEDURE:   1.  right carotid endarterectomy with bovine patch angioplasty 2.  right intraoperative carotid ultrasound  PRE-OPERATIVE DIAGNOSIS: right symptomatic carotid stenosis >80 %  POST-OPERATIVE DIAGNOSIS: same as above   SURGEON: [name], MD  ASSISTANT(S): Dr. Fabienne Bruns; Doreatha Massed, [facility]   ANESTHESIA: general  ESTIMATED BLOOD LOSS: 200 cc  FINDING(S): 1.  Continuous doppler audible flow signatures are appropriate for each carotid artery. 2.  No evidence of intimal flap visualized on transverse or longitudinal ultrasonography. 3.  Smooth carotid plaque.  SPECIMEN(S):  Carotid plaque (sent to Pathology)  INDICATIONS:   Johnathan Keith is a 53 y.o. male who presents with right symptomatic carotid stenosis >80%.  I discussed with the patient the risks, benefits, and alternatives to carotid endarterectomy.  I discussed the procedural details of carotid endarterectomy with the patient.  The patient is aware that the risks of carotid endarterectomy include but are not limited to: bleeding, infection, stroke, myocardial infarction, death, cranial nerve injuries both temporary and permanent, neck hematoma, possible airway compromise, labile blood pressure post-operatively, cerebral hyperperfusion syndrome, and possible need for additional interventions in the future. The patient is aware of the risks and agrees to proceed forward with the procedure.  DESCRIPTION: After full informed written consent was obtained from the patient, the patient was brought back to the operating room and placed supine upon the operating table.  Prior to induction, the patient received IV antibiotics.  After obtaining adequate anesthesia, the patient was placed into semi-Fowler position with a shoulder roll in place and the patient's neck slightly hyperextended and rotated away from the surgical site.  The patient was prepped in the standard fashion for a right carotid  endarterectomy.  I made an incision anterior to the sternocleidomastoid muscle and dissected down through the subcutaneous tissue.  The platysmas was opened with electrocautery.  Then I dissected down to the internal jugular vein.  This was dissected posteriorly until I obtained visualization of the common carotid artery.  This was dissected out and then an umbilical tape was placed around the common carotid artery and I loosely applied a Rumel tourniquet.  I then dissected in a periadventitial fashion along the common carotid artery up to the bifurcation.  I then identified the external carotid artery and the superior thyroid artery.  A 2-0 silk tie was looped around the superior thyroid artery, and I also dissected out the external carotid artery and placed a vessel loop around it.  In continuing the dissection to the internal carotid artery, I identified the facial vein.  This was ligated and then transected, giving me improved exposure of the internal carotid artery.  In the process of this dissection, the hypoglossal nerve was identified.  I then dissected out the internal carotid artery until I identified an area of soft tissue in the internal carotid artery.  Due to the high carotid bifurcation, I did not think a Rumel tourniquet could be applied to the internal carotid artery.   I dissected slightly distal to relatively disease free portion of the internal carotid artery and placed a vessel loop around the artery.  At this point, we gave the patient a therapeutic bolus of Heparin intravenously (roughly 80 units/kg).  After waiting 3 minutes, then I clamped the external carotid artery and then the common carotid artery.  Using a butterfly needle connected to the arterial pressure circuit, I cannulated the common carotid artery distal to the clamp.  The stump pressure was measured  at: ~50 mm Hg.  Based on this measurement, I felt no shunt was needed.  I then made an arteriotomy in the common carotid artery  with a 11 blade, and extended the arteriotomy with a Potts scissor down into the common carotid artery, then I carried the arteriotomy through the bifurcation into the internal carotid artery until I reached an area that was not diseased.  At this point, I started the endarterectomy in the common carotid artery with a Cytogeneticistenfield dissector and carried this dissection down into the common carotid artery circumferentially.  Then I transected the plaque at a segment where it was adherent.  I then carried this dissection up into the external carotid artery.  The plaque was extracted by unclamping the external carotid artery and everting the artery.  The dissection was then carried into the internal carotid artery, extracting the remaining portion of the carotid plaque.  I passed the plaque off the field as a specimen.  I then spent the next 30 minutes removing intimal flaps and loose debris.  Eventually I reached the point where the residual plaque was densely adherent and any further dissection would compromise the integrity of the wall.  After verifying that there was no more loose intimal flaps or debris, I re-interrogated the entirety of this carotid artery.  At this point, I was satisfied that the minimal remaining disease was densely adherent to the wall and wall integrity was intact.  At this point, I then fashioned a bovine pericardial patch for the geometry of this artery and sewed it in place with two running stitch of 6-0 Prolene, one from each end.  Prior to completing this patch angioplasty, I backbled the external carotid artery and then reclamped it.  Back bleeding was: vigorous.   I then backbled the internal carotid artery and then reclamped it.  Back bleeding was: pulsatile.   Finally, I backbled the common carotid artery and then reclamped it.  Back bleeding was: pulsatile.   I completed the patch angioplasty in the usual fashion.  First, I released the clamp on the external carotid artery, then I  released it on the common carotid artery.  After waiting a few seconds, I then released it on the internal carotid artery.  I then interrogated this patient's arteries with the continuous Doppler.  The audible waveforms in each artery were consistent with the expected characteristics for each artery.  The Sonosite probe was then sterilely draped and used to interrogate the carotid artery in both longitudinal and transverse views.  At this point, I washed out the wound, and placed thrombin and Gelfoam throughout.  I also gave the patient 30 mg of protamine to reverse his anticoagulation.   After waiting a few minutes, I removed the thrombin and Gelfoam and washed out the wound.  There was no more active bleeding in the surgical site.   I then reapproximated the platysma muscle with a running stitch of 3-0 Vicryl.  The skin was then reapproximated with a running subcuticular 4-0 Monocryl stitch.  The skin was then cleaned, dried and Dermabond was used to reinforce the skin closure.  The patient woke without any problems, neurologically intact.     COMPLICATIONS: none  CONDITION: stable  Leonides SakeBrian Deangela Randleman, MD [facility] [place] Office: [phone] Pager: [phone]  09/26/2014, 10:54 AM

## 2014-09-26 NOTE — Telephone Encounter (Addendum)
Message copied by Fredrich Birks on Mon Sep 26, 2014  3:59 PM ------      Message from: Sharee Pimple      Created: Mon Sep 26, 2014  9:52 AM      Regarding: Schedule                   ----- Message -----         From: Dara Lords, PA-C         Sent: 09/26/2014   9:11 AM           To: [facility] Charge Pool            S/p right CEA 09/26/14.  F/u with Dr. [name] in 2 weeks.            Thanks,      Samantha ------  09/26/14: left msg for pt re appt, dpm

## 2014-09-26 NOTE — Anesthesia Preprocedure Evaluation (Signed)
Anesthesia Evaluation  Patient identified by MRN, date of birth, ID band Patient awake    Reviewed: Allergy & Precautions, H&P , NPO status , Patient's Chart, lab work & pertinent test results  History of Anesthesia Complications (+) PONV and history of anesthetic complications  Airway Mallampati: II TM Distance: >3 FB Neck ROM: Full    Dental  (+) Loose, Dental Advisory Given, Missing   Pulmonary neg pulmonary ROS,    Pulmonary exam normal       Cardiovascular hypertension, + CAD, + Past MI and + Peripheral Vascular Disease     Neuro/Psych CVA, Residual Symptoms negative psych ROS   GI/Hepatic negative GI ROS, Neg liver ROS,   Endo/Other  diabetes  Renal/GU      Musculoskeletal   Abdominal   Peds  Hematology   Anesthesia Other Findings   Reproductive/Obstetrics                           Anesthesia Physical Anesthesia Plan  ASA: III  Anesthesia Plan: General   Post-op Pain Management:    Induction: Intravenous  Airway Management Planned: Oral ETT  Additional Equipment:   Intra-op Plan:   Post-operative Plan: Extubation in OR  Informed Consent: I have reviewed the patients History and Physical, chart, labs and discussed the procedure including the risks, benefits and alternatives for the proposed anesthesia with the patient or authorized representative who has indicated his/her understanding and acceptance.   Dental advisory given  Plan Discussed with: CRNA, Anesthesiologist and Surgeon  Anesthesia Plan Comments:         Anesthesia Quick Evaluation

## 2014-09-26 NOTE — Progress Notes (Signed)
Inpatient Diabetes Program Recommendations  AACE/ADA: New Consensus Statement on Inpatient Glycemic Control (2013)  Target Ranges:  Prepandial:   less than 140 mg/dL      Peak postprandial:   less than 180 mg/dL (1-2 hours)      Critically ill patients:  140 - 180 mg/dL   Reason for Visit: Consult for medication management of dm while here   Diabetes history: type 2 Outpatient Diabetes medications: Novolog 70/30 mix 50 units bid, Januvia 100 mg, Farxiga, Current orders for Inpatient glycemic control: Moderate correction tidwc, Tradjenta 5 mg, and Farxiga 10 mg  Recommendations: Noted cbg's are normal as charted thus far today. Unsure of what pt took this am, but feel certain pt will needs some basal lantus / levemir or at least 1/2 dosage of 70/30 mix. Would recommend using 25 units ac supper tonight and then 25 units in the am. Until eating, pt does not need the Tradjenta. Continue correction as ordered. Will follow patient to assess needs as day and night progress. Will check again in the am.  Thank you, Lenor Coffin, RN, CNS, Diabetes Coordinator [phone])

## 2014-09-26 NOTE — Transfer of Care (Signed)
Immediate Anesthesia Transfer of Care Note  Patient: Johnathan Keith  Procedure(s) Performed: Procedure(s): RIGHT CAROTID ENDARTERECTOMY WITH PATCH ANGIOPLASTY (Right)  Patient Location: PACU  Anesthesia Type:General  Level of Consciousness: awake, alert  and oriented  Airway & Oxygen Therapy: Patient Spontanous Breathing and Patient connected to face mask oxygen  Post-op Assessment: Report given to PACU RN, Post -op Vital signs reviewed and stable and Patient moving all extremities X 4  Post vital signs: Reviewed and stable  Complications: No apparent anesthesia complications

## 2014-09-26 NOTE — Progress Notes (Signed)
Utilization review completed.  

## 2014-09-26 NOTE — Anesthesia Postprocedure Evaluation (Signed)
Anesthesia Post Note  Patient: Johnathan Keith  Procedure(s) Performed: Procedure(s) (LRB): RIGHT CAROTID ENDARTERECTOMY WITH PATCH ANGIOPLASTY (Right)  Anesthesia type: general  Patient location: PACU  Post pain: Pain level controlled  Post assessment: Patient's Cardiovascular Status Stable  Last Vitals:  Filed Vitals:   09/26/14 1145  BP:   Pulse: 80  Temp:   Resp: 11    Post vital signs: Reviewed and stable  Level of consciousness: sedated  Complications: No apparent anesthesia complications

## 2014-09-26 NOTE — Progress Notes (Signed)
  [facility] Progress Note  09/26/2014 3:27 PM Day of Surgery  Subjective:  Patient seen in PACU. Having a sore throat and pain around incision.   Tmax 98.5 BP sys 80s-130s 02 99% 3L  Filed Vitals:   09/26/14 1430  BP:   Pulse: 71  Temp: 98 F (36.7 C)  Resp: 22     Physical Exam: Neuro:  No smile asymmetry, no tongue deviation. 5/5 strength upper and lower extremities bilaterally.  Incision: Right neck incision clean dry and intact without hematoma.   CBC    Component Value Date/Time   WBC 10.0 09/22/2014 1433   RBC 4.77 09/22/2014 1433   HGB 14.8 09/22/2014 1433   HCT 43.1 09/22/2014 1433   PLT 282 09/22/2014 1433   MCV 90.4 09/22/2014 1433   MCH 31.0 09/22/2014 1433   MCHC 34.3 09/22/2014 1433   RDW 12.9 09/22/2014 1433    BMET    Component Value Date/Time   NA 140 09/22/2014 1433   K 4.2 09/22/2014 1433   CL 99 09/22/2014 1433   CO2 26 09/22/2014 1433   GLUCOSE 322* 09/22/2014 1433   BUN 23 09/22/2014 1433   CREATININE 0.91 09/22/2014 1433   CALCIUM 9.4 09/22/2014 1433   GFRNONAA >90 09/22/2014 1433   GFRAA >90 09/22/2014 1433     Intake/Output Summary (Last 24 hours) at 09/26/14 1527 Last data filed at 09/26/14 1100  Gross per 24 hour  Intake   1800 ml  Output    200 ml  Net   1600 ml      Assessment/Plan:  This is a 53 y.o. male who is s/p left CEA Day of Surgery  -Neuro exam intact. Patient has chronic left hand numbness and left foot numbness. -Incision clean and intact. No hematoma.  -BP stable. -Advance diet as tolerated. -Ambulate. -To 3S.     Maris Berger, PA-C [facility] Office: [phone] Pager: [phone] 09/26/2014 3:27 PM

## 2014-09-26 NOTE — Anesthesia Procedure Notes (Signed)
Procedure Name: Intubation Date/Time: 09/26/2014 7:34 AM Performed by: Lanell Matar Pre-anesthesia Checklist: Patient identified, Timeout performed, Emergency Drugs available, Suction available and Patient being monitored Patient Re-evaluated:Patient Re-evaluated prior to inductionOxygen Delivery Method: Circle system utilized Preoxygenation: Pre-oxygenation with 100% oxygen Intubation Type: IV induction Ventilation: Mask ventilation without difficulty and Two handed mask ventilation required Laryngoscope Size: Miller and 2 Grade View: Grade III Tube type: Oral Tube size: 7.5 mm Number of attempts: 1 Airway Equipment and Method: Bougie stylet Placement Confirmation: ETT inserted through vocal cords under direct vision,  breath sounds checked- equal and bilateral,  positive ETCO2 and CO2 detector Secured at: 23 cm Tube secured with: Tape Dental Injury: Teeth and Oropharynx as per pre-operative assessment

## 2014-09-27 ENCOUNTER — Encounter (HOSPITAL_COMMUNITY): Payer: Self-pay | Admitting: Vascular Surgery

## 2014-09-27 LAB — BASIC METABOLIC PANEL
Anion gap: 14 (ref 5–15)
BUN: 17 mg/dL (ref 6–23)
CO2: 21 mEq/L (ref 19–32)
Calcium: 8.4 mg/dL (ref 8.4–10.5)
Chloride: 104 mEq/L (ref 96–112)
Creatinine, Ser: 0.69 mg/dL (ref 0.50–1.35)
GFR calc Af Amer: >90 mL/min (ref >90)
GFR calc non Af Amer: >90 mL/min (ref >90)
Glucose, Bld: 162 mg/dL — ABNORMAL HIGH (ref 70–99)
Potassium: 4 mEq/L (ref 3.7–5.3)
Sodium: 139 mEq/L (ref 137–147)

## 2014-09-27 LAB — CBC
HCT: 36.4 % — ABNORMAL LOW (ref 39.0–52.0)
Hemoglobin: 12.4 g/dL — ABNORMAL LOW (ref 13.0–17.0)
MCH: 31 pg (ref 26.0–34.0)
MCHC: 34.1 g/dL (ref 30.0–36.0)
MCV: 91 fL (ref 78.0–100.0)
Platelets: 221 10*3/uL (ref 150–400)
RBC: 4 MIL/uL — ABNORMAL LOW (ref 4.22–5.81)
RDW: 13.2 % (ref 11.5–15.5)
WBC: 13.7 10*3/uL — ABNORMAL HIGH (ref 4.0–10.5)

## 2014-09-27 LAB — GLUCOSE, CAPILLARY
Glucose-Capillary: 148 mg/dL — ABNORMAL HIGH (ref 70–99)
Glucose-Capillary: 166 mg/dL — ABNORMAL HIGH (ref 70–99)
Glucose-Capillary: 196 mg/dL — ABNORMAL HIGH (ref 70–99)
Glucose-Capillary: 158 mg/dL — ABNORMAL HIGH (ref 70–99)
Glucose-Capillary: 142 mg/dL — ABNORMAL HIGH (ref 70–99)

## 2014-09-27 MED ORDER — INSULIN NPH (HUMAN) (ISOPHANE) 100 UNIT/ML ~~LOC~~ SUSP: 17.0000 [IU] | Freq: Every day | SUBCUTANEOUS | Status: DC

## 2014-09-27 MED ORDER — TAMSULOSIN HCL 0.4 MG PO CAPS: 0.4000 mg | ORAL_CAPSULE | Freq: Every day | ORAL | Status: DC

## 2014-09-27 MED ORDER — IBUPROFEN 400 MG PO TABS
400.0000 mg | ORAL_TABLET | Freq: Four times a day (QID) | ORAL | Status: DC | PRN
  Administered 2014-09-28: 400 mg via ORAL
  Filled 2014-09-27: qty 1

## 2014-09-27 MED ORDER — INSULIN NPH (HUMAN) (ISOPHANE) 100 UNIT/ML ~~LOC~~ SUSP
17.0000 [IU] | Freq: Two times a day (BID) | SUBCUTANEOUS | Status: DC
  Administered 2014-09-27 – 2014-09-28 (×2): 17 [IU] via SUBCUTANEOUS
  Filled 2014-09-27: qty 10

## 2014-09-27 MED ORDER — INSULIN ASPART PROT & ASPART (70-30 MIX) 100 UNIT/ML ~~LOC~~ SUSP: 20.0000 [IU] | Freq: Two times a day (BID) | SUBCUTANEOUS | Status: DC

## 2014-09-27 NOTE — Progress Notes (Signed)
Inpatient Diabetes Program Recommendations  AACE/ADA: New Consensus Statement on Inpatient Glycemic Control (2013)  Target Ranges:  Prepandial:   less than 140 mg/dL      Peak postprandial:   less than 180 mg/dL (1-2 hours)      Critically ill patients:  140 - 180 mg/dL   Noted patient still on clears and taking approximately 50% po. CBG's are stable and controlled.  Agree with holding the SGL2 inhibitor. Marcelline Deist if pt having a problem with voiding Tradjenta is okay, however if pt is not eating or drinking much, it has little effect either way. Until taking more po's, the correction alone appears to be adequate.  Once eating, may need to add some of 70/30 home dose at half home dose, i.e. 20-25 units 70/30.    Thank you, Lenor Coffin, RN, CNS, Diabetes Coordinator [phone])

## 2014-09-27 NOTE — Progress Notes (Signed)
  Progress Note    09/27/2014 4:31 PM 1 Day Post-Op  Subjective:  Saw pt earlier today and he c/o headache, which has resolved at this time.  He states he has had headaches for 2-3 weeks.  States he has only voided about 1/3 cup of urine since catheter out earlier this afternoon.  When seeing him again today, he has voided very little.  He is concerned about his insulin.    Filed Vitals:   09/27/14 1508  BP: 115/62  Pulse: 86  Temp: 98.3 F (36.8 C)  Resp: 20     Physical Exam: Neuro:  In tact Incision:  C/d/i and unchanged for this morning's visit  CBC    Component Value Date/Time   WBC 13.7* 09/27/2014 0500   RBC 4.00* 09/27/2014 0500   HGB 12.4* 09/27/2014 0500   HCT 36.4* 09/27/2014 0500   PLT 221 09/27/2014 0500   MCV 91.0 09/27/2014 0500   MCH 31.0 09/27/2014 0500   MCHC 34.1 09/27/2014 0500   RDW 13.2 09/27/2014 0500    BMET    Component Value Date/Time   NA 139 09/27/2014 0500   K 4.0 09/27/2014 0500   CL 104 09/27/2014 0500   CO2 21 09/27/2014 0500   GLUCOSE 162* 09/27/2014 0500   BUN 17 09/27/2014 0500   CREATININE 0.69 09/27/2014 0500   CALCIUM 8.4 09/27/2014 0500   GFRNONAA >90 09/27/2014 0500   GFRAA >90 09/27/2014 0500     Intake/Output Summary (Last 24 hours) at 09/27/14 1631 Last data filed at 09/27/14 1509  Gross per 24 hour  Intake 1502.5 ml  Output   3301 ml  Net -1798.5 ml      Assessment/Plan:  This is a 53 y.o. male who is s/p right CEA 1 Day Post-Op  -pt is doing well this afternoon neurologically.  His headache has improved.  Ibuprofen has been ordered for him shall he need it as this is what he takes at home for HA.  Discussed with pt following up with his PCP to workup his headaches as they have been ongoing for 2-3 weeks. -pt has ambulated -RN bladder scanned pt and he has > 999 ml.  Will reinsert Foley.  He was started on Flomax last pm and we will continue this. -continue to hold [place] with his urinary  retention. -glucose trending upward despite pt's po intake, which is decreased from normal.  -Spoke with Diabetic coordinator, since he po intake is decreased, she recommends NPH 17 units at bedtime and with breakfast.  She will f/u up on next blood sugar and possibly change sliding scale to resistant if he has not responded.    Doreatha Massed, PA-C [facility] [phone]

## 2014-09-27 NOTE — Progress Notes (Signed)
Inpatient Diabetes Program Recommendations  AACE/ADA: New Consensus Statement on Inpatient Glycemic Control (2013)  Target Ranges:  Prepandial:   less than 140 mg/dL      Peak postprandial:   less than 180 mg/dL (1-2 hours)      Critically ill patients:  140 - 180 mg/dL   Note:  CBG before supper is 158 per nurse tech.  Current Novolog correction scale seems adequate for now.  Will reassess overall control tomorrow and evaluate effects of bid NPH.  Thank you.  Dorrine Montone S. Elsie Lincoln, RN, CNS, CDE Inpatient Diabetes Program, team pager [phone]

## 2014-09-27 NOTE — Progress Notes (Signed)
Pt bladder scanned, revealed >1000cc urine in bladder. Doreatha Massed PA notified, received orders to place foley catheter for acute urinary retention.

## 2014-09-27 NOTE — Progress Notes (Addendum)
Received orders to transfer pt. Pt and family aware of transfer. Attempted to call report to RN on 2W, awaiting call back. VS stable, no s/s of acute distress noted.  1733: report called to RN on 2W, VS stable, no s/s of acute distress noted. Will transfer pt as soon as his dinner tray arrives.

## 2014-09-27 NOTE — Progress Notes (Addendum)
  Progress Note    09/27/2014 7:32 AM 1 Day Post-Op  Subjective:  Still having some headaches and a little sore  afebrile  Filed Vitals:   09/27/14 0425  BP: 137/67  Pulse: 91  Temp: 98.2 F (36.8 C)  Resp: 19     Physical Exam: Neuro:  In tact Incision:  C/d/i  CBC    Component Value Date/Time   WBC 13.7* 09/27/2014 0500   RBC 4.00* 09/27/2014 0500   HGB 12.4* 09/27/2014 0500   HCT 36.4* 09/27/2014 0500   PLT 221 09/27/2014 0500   MCV 91.0 09/27/2014 0500   MCH 31.0 09/27/2014 0500   MCHC 34.1 09/27/2014 0500   RDW 13.2 09/27/2014 0500    BMET    Component Value Date/Time   NA 139 09/27/2014 0500   K 4.0 09/27/2014 0500   CL 104 09/27/2014 0500   CO2 21 09/27/2014 0500   GLUCOSE 162* 09/27/2014 0500   BUN 17 09/27/2014 0500   CREATININE 0.69 09/27/2014 0500   CALCIUM 8.4 09/27/2014 0500   GFRNONAA >90 09/27/2014 0500   GFRAA >90 09/27/2014 0500     Intake/Output Summary (Last 24 hours) at 09/27/14 0732 Last data filed at 09/27/14 0431  Gross per 24 hour  Intake 2822.5 ml  Output   3150 ml  Net -327.5 ml      Assessment/Plan:  This is a 53 y.o. male who is s/p right CEA 1 Day Post-Op  -pt is doing well this am. -pt neuro exam is in tact -pt has ambulated -pt has not voided-foley cath had to be replaced last pm.  Will remove this morning -await breakfast to see how he tolerates.  He states he is taking small sips to keep from choking.   Doreatha Massed, PA-C [facility] [phone]  Addendum  I have independently interviewed and examined the patient, and I agree with the physician assistant's findings.  BP ok overnight.  No hypertension so risk of cerebral hyperperfusion lower.  Awaiting void trial after foley removed.  Flomax started last night.  Awaiting trial of breakfast.  Pt had high extent of disease but did not extend past angle of mandible, so risk of cranial nerve injury is limited.  [name], MD [facility] [place] Office: [phone] Pager: [phone]  09/27/2014, 7:43 AM

## 2014-09-28 ENCOUNTER — Ambulatory Visit: Payer: No Typology Code available for payment source | Admitting: Cardiology

## 2014-09-28 LAB — GLUCOSE, CAPILLARY
Glucose-Capillary: 115 mg/dL — ABNORMAL HIGH (ref 70–99)
Glucose-Capillary: 213 mg/dL — ABNORMAL HIGH (ref 70–99)

## 2014-09-28 MED ORDER — ENOXAPARIN SODIUM 40 MG/0.4ML ~~LOC~~ SOLN
40.0000 mg | SUBCUTANEOUS | Status: DC
  Filled 2014-09-28: qty 0.4

## 2014-09-28 NOTE — Progress Notes (Signed)
Inpatient Diabetes Program Recommendations  AACE/ADA: New Consensus Statement on Inpatient Glycemic Control (2013)  Target Ranges:  Prepandial:   less than 140 mg/dL      Peak postprandial:   less than 180 mg/dL (1-2 hours)      Critically ill patients:  140 - 180 mg/dL   Reason for Visit: Follow-up regarding insulin management in hospital  Diabetes history: type 2 Outpatient Diabetes medications: 70/30 50 units bid, Farxiga 10 mg daily, Januvia 100 mg daily Current orders for Inpatient glycemic control: NPH 17 units q AM, NPH 17 units q HS, Tradjenta 5 mg daily, Novolog moderate correction scale tid  Results for KAYLIB, STREFF (MRN [id number]) as of 09/28/2014 12:01  Ref. Range 09/27/2014 07:47 09/27/2014 12:23 09/27/2014 16:51 09/27/2014 21:28 09/28/2014 06:20 09/28/2014 11:15  Glucose-Capillary Latest Range: 70-99 mg/dL 619 (H) 509 (H) 326 (H) 142 (H) 115 (H) 213 (H)   Note:  Patient started on NPH portion of 70/30 BID based on half of home dose of 70/30- since patient not eating much.  Patient states he ate 100% of breakfast.  Did not receive am NPH until 10:55.  States that insulin does not work well on him (ie, is very resistant).  Recommend the following:  Discontinue current NPH orders  Continue Novolog correction as ordered   Tonight at supper- one-time dose, Novolog 8 units (will also get Novolog Moderate correction scale at that time)  Tonight at HS- One-time dose of NPH 27 units  Beginning tomorrow, 70/30 38 units 70/30 bid at breakfast and supper (based on three fourths home dose) and continue current correction  Beginning tomorrow, make sure he receives no more doses of NPH.   Thank you.  Natasha Burda S. Elsie Lincoln, RN, CNS, CDE Inpatient Diabetes Program, team pager [phone]

## 2014-09-28 NOTE — Progress Notes (Signed)
PLACED 50F FOLEY CATHETER PER ORDER. >900CC OF CLEAR YELLOW URINE OBTAINED. PT TOLERATED PROCEDURE WELL. EDUCATED PT AND WIFE OF USE OF LEG BAG, CLEANING AROUND PENIS AND CATHETER INSERTION SITE, SIGNS AND SYMPTOMS OF INFECTION. PT AND WIFE VU.

## 2014-09-28 NOTE — Progress Notes (Signed)
PT VOIDED 100CC 2 DIFFERENT TIMES. BLADDER SCAN RESULTS WERE >560ML. PA, CALLED AND RESULTS OF BLADDER SCAN GIVEN. ORDERS TO PLACE FOLEY CATHETER AND DISCHARGE GIVEN. MONITORING WILL CONTINUE.

## 2014-09-28 NOTE — Discharge Summary (Signed)
Discharge Summary     Johnathan Keith 1961-04-03 53 y.o. male  [id number]  Admission Date: 09/26/2014  Discharge Date: 09/28/14  Physician: Fransisco Hertz, MD  Admission Diagnosis: Carotid stenosis, right    HPI:   This is a 53 y.o. male who presents with chief complaint: cardiac follow-up and CTA follow-up. Pt has sx RICA stenosis>80%. He was sent for preop cardiac clearance: Dr. Hazle Coca office has cleared the patient to proceed. The patient initially had L side weakness and balance issues. Most of his sx have resolved. He had a CTA Neck complete due to carotid duplex suggesting high disease.  Hospital Course:  The patient was admitted to the hospital and taken to the operating room on 09/26/2014 and underwent right carotid endarterectomy.  The pt tolerated the procedure well and was transported to the PACU in good condition.   By POD 1, the pt neuro status was in tact.  He was complaining of headaches.  He states that he has headaches for 2-3 weeks.  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  This is not unexpected and usually resolves with time.  His foley was removed and he did have urinary retention.  Flomax was started and the foley was re-inserted.  It was removed again on the morning of POD 2.  He was still having urinary retention.  The foley was once again re-inserted, a leg bag attached with pt teaching on how to empty, and a follow up appointment with Urology is made.  During his hospital admission, a diabetic coordinator consult was obtained to help manage his insulin.  Since he had inadequate intake of meals, he received 17 units of NPH the night of POD 1.  His intake increased the next day.  The pt states he is very resistant to insulin.  He is placed back on his regular regimen of his home medication per Dr. [name] at discharge.  The remainder of the hospital course consisted of increasing mobilization and increasing intake of solids without difficulty.    Recent  Labs  09/27/14 0500  NA 139  K 4.0  CL 104  CO2 21  GLUCOSE 162*  BUN 17  CALCIUM 8.4    Recent Labs  09/26/14 1800 09/27/14 0500  WBC 15.3* 13.7*  HGB 12.6* 12.4*  HCT 37.0* 36.4*  PLT 239 221   No results found for this basename: INR,  in the last 72 hours  Discharge Instructions:   The patient is discharged with extensive instructions on wound care and progressive ambulation.  They are instructed not to drive or perform any heavy lifting until returning to see the physician in his office.      Discharge Instructions   CAROTID Sugery: Call MD for difficulty swallowing or speaking; weakness in arms or legs that is a new symtom; severe headache.  If you have increased swelling in the neck and/or  are having difficulty breathing, CALL [phone]    Complete by:  As directed      Call MD for:  redness, tenderness, or signs of infection (pain, swelling, bleeding, redness, odor or green/yellow discharge around incision site)    Complete by:  As directed      Call MD for:  severe or increased pain, loss or decreased feeling  in affected limb(s)    Complete by:  As directed      Call MD for:  temperature >100.5    Complete by:  As directed      Discharge wound care:  Complete by:  As directed   Shower daily with soap and water starting 09/28/14     Driving Restrictions    Complete by:  As directed   No driving for 2 weeks     Lifting restrictions    Complete by:  As directed   No lifting for 2 weeks     Nursing communication    Complete by:  As directed   Please teach patient about emptying leg bag for foley.     Resume previous diet    Complete by:  As directed            Discharge Diagnosis:  Carotid stenosis, right   Secondary Diagnosis: Patient Active Problem List   Diagnosis Date Noted  . Carotid artery stenosis 09/26/2014  . Coronary artery disease 09/16/2014  . CVA (cerebral vascular accident) 07/04/2014  . Carotid artery stenosis with cerebral infarction  over 8 weeks ago 07/04/2014  . HTN (hypertension) 07/04/2014  . Type II or unspecified type diabetes mellitus without mention of complication, uncontrolled 07/04/2014  . HLD (hyperlipidemia) 07/04/2014   Past Medical History  Diagnosis Date  . Hypertension   . Diabetes mellitus without complication   . High cholesterol   . Stroke   . Heart attack   . Diabetic retinopathy   . Peripheral vascular disease     carotid artery disease  . PONV (postoperative nausea and vomiting)       Medication List         amLODipine 5 MG tablet  Commonly known as:  NORVASC  Take 5 mg by mouth daily.     aspirin 325 MG tablet  Take 325 mg by mouth daily.     atorvastatin 40 MG tablet  Commonly known as:  LIPITOR  Take 40 mg by mouth at bedtime.     dicyclomine 10 MG capsule  Commonly known as:  BENTYL  Take 10 mg by mouth 2 (two) times daily.     FARXIGA 10 MG Tabs  Generic drug:  Dapagliflozin Propanediol  Take 10 mg by mouth daily.     furosemide 80 MG tablet  Commonly known as:  LASIX  Take 80 mg by mouth daily.     insulin NPH-regular Human (70-30) 100 UNIT/ML injection  Commonly known as:  NOVOLIN 70/30  Inject 50 Units into the skin See admin instructions. Take 50 units with breakfast and dinner,  May take 50 units at bedtime as needed     lisinopril 40 MG tablet  Commonly known as:  PRINIVIL,ZESTRIL  Take 40 mg by mouth daily.     LOMOTIL PO  Take 1 tablet by mouth 2 (two) times daily as needed (For diarrhea).     diphenoxylate-atropine 2.5-0.025 MG per tablet  Commonly known as:  LOMOTIL     metoprolol tartrate 25 MG tablet  Commonly known as:  LOPRESSOR  Take 25 mg by mouth 2 (two) times daily.     mupirocin ointment 2 %  Commonly known as:  BACTROBAN     omeprazole 20 MG capsule  Commonly known as:  PRILOSEC  Take 20 mg by mouth daily.     oxyCODONE 5 MG immediate release tablet  Commonly known as:  ROXICODONE  Take 1 tablet (5 mg total) by mouth every 6  (six) hours as needed.     potassium chloride SA 20 MEQ tablet  Commonly known as:  K-DUR,KLOR-CON  Take 20 mEq by mouth daily.     sitaGLIPtin 100 MG  tablet  Commonly known as:  JANUVIA  Take 100 mg by mouth daily.     tamsulosin 0.4 MG Caps capsule  Commonly known as:  FLOMAX  Take 1 capsule (0.4 mg total) by mouth daily.     testosterone cypionate 200 MG/ML injection  Commonly known as:  DEPOTESTOTERONE CYPIONATE  Inject into the muscle every 14 (fourteen) days. As directed     VITAMIN D (CHOLECALCIFEROL) PO  Take 1 tablet by mouth daily.       Rx given at discharge: Roxicodone #30 No Refill Flomax 0.4 mg #30 1 refill  Disposition: home with foley catheter/leg bag  Patient's condition: is Good  Follow up: 1. Dr. Imogene Burnhen in 2 weeks. 2. Caroline MoreElizabeth Prince in 1 week for f/u on headaches and diabetes management and arrange sleep study for OSA 3. Dr. Sherron MondayMacDiarmid on 10/21/14 @ 9am ([facility])   Doreatha MassedSamantha Rhyne, PA-C [facility] [phone]  Addendum  I have independently interviewed and examined the patient, and I agree with the physician assistant's findings.  Pt underwent a relatively uneventful R CEA except for high extent of disease.  His post-op course was remarkable only for urinary retention and obstructive sleep apnea.  The patient had to be discharged with a foley and leg bag to inability to void despite starting Flomax.  He will follow up with Urology on 10/21/14.  He will follow up with me in 2 weeks for wound check.  Leonides SakeBrian Edilberto Roosevelt, MD [facility] [place] Office: [phone] Pager: [phone]  09/28/2014, 2:27 PM    --- For VQI Registry use --- Instructions: Press F2 to tab through selections.  Delete question if not applicable.    Modified Rankin score at D/C (0-6): 0  IV medication needed for:  1. Hypertension: No 2. Hypotension: No  Post-op Complications: No  1. Post-op CVA or TIA: No  2. CN  injury: No  3. Myocardial infarction: No  4.  CHF: No  5.  Dysrhythmia (new): No  6. Wound infection: No  7. Reperfusion symptoms: No  8. Return to OR: No  If yes: return to OR for (bleeding, neurologic, other CEA incision, other): n/a  Discharge medications: Statin use:  Yes If No: [ ]  For Medical reasons, [ ]  Non-compliant, [ ]  Not-indicated ASA use:  Yes  If No: [ ]  For Medical reasons, [ ]  Non-compliant, [ ]  Not-indicated Beta blocker use:  Yes If No: [ ]  For Medical reasons, [ ]  Non-compliant, [ ]  Not-indicated ACE-Inhibitor use:  Yes If No: [ ]  For Medical reasons, [ ]  Non-compliant, [ ]  Not-indicated P2Y12 Antagonist use: No, [ ]  Plavix, [ ]  Plasugrel, [ ]  Ticlopinine, [ ]  Ticagrelor, [ ]  Other, [ ]  No for medical reason, [ ]  Non-compliant, [ ]  Not-indicated Anti-coagulant use:  No, [ ]  Warfarin, [ ]  Rivaroxaban, [ ]  Dabigatran, [ ]  Other, [ ]  No for medical reason, [ ]  Non-compliant, [ ]  Not-indicated

## 2014-09-28 NOTE — Progress Notes (Addendum)
Patient stated that he is having some numbness under his chin after a right carotid endarterectomy with bovine patch angioplasty on the 19 th. Dr. Luberta Robertson, will continue to monitor.  7425 No new orders given, will continue to monitor.

## 2014-09-28 NOTE — Plan of Care (Signed)
Problem: Consults Goal: General Medical Patient Education See Patient Education Module for specific education. Outcome: Progressing Urinary retention. Placed 42f foley catheter per order. followup with outpt.

## 2014-09-28 NOTE — Progress Notes (Addendum)
   Daily Progress Note  Assessment/Planning: POD #2 s/p R CEA for R ICA stenosis >80%, uncontrolled DM, previously undiagnosed OSA, urinary retention likely sec. BPH and postop status   Difficulties with urinary retention lead to continued hospitalization overnight  D/C foley and voiding trial this AM.  If pt unable to void, replace foley and attach leg bag.  Follow up with urology in 2 weeks.  Neuro intact.  R marginal mandibular palsy not unexpected, usually resolves with time.  Pt reportedly has apnea at night. He was told to follow up with his PCP to arrange sleep study.  At this point, pt is off supplemental oxygen  Subjective  - 2 Days Post-Op  C/o some pain in neck incision, ate bread and oatmeal  Objective Filed Vitals:   09/27/14 1224 09/27/14 1508 09/27/14 1857 09/27/14 2125  BP: 130/72 115/62 132/74 121/56  Pulse: 76 86 92 93  Temp: 97.8 F (36.6 C) 98.3 F (36.8 C) 98.5 F (36.9 C) 98.6 F (37 C)  TempSrc: Oral Oral Oral Oral  Resp: 17 20 18 18   Height:      Weight:      SpO2: 99% 95% 95% 93%    Intake/Output Summary (Last 24 hours) at 09/28/14 0803 Last data filed at 09/27/14 2134  Gross per 24 hour  Intake 1277.5 ml  Output   2251 ml  Net -973.5 ml    PULM  CTAB CV  RRR GI  soft, NTND NECK No hematoma NEURO CN intact, midline tongue, MS 5/5 sym  Laboratory CBC    Component Value Date/Time   WBC 13.7* 09/27/2014 0500   HGB 12.4* 09/27/2014 0500   HCT 36.4* 09/27/2014 0500   PLT 221 09/27/2014 0500    BMET    Component Value Date/Time   NA 139 09/27/2014 0500   K 4.0 09/27/2014 0500   CL 104 09/27/2014 0500   CO2 21 09/27/2014 0500   GLUCOSE 162* 09/27/2014 0500   BUN 17 09/27/2014 0500   CREATININE 0.69 09/27/2014 0500   CALCIUM 8.4 09/27/2014 0500   GFRNONAA >90 09/27/2014 0500   GFRAA >90 09/27/2014 0500    [name], MD [facility] [place] Office: [phone] Pager: [phone]  09/28/2014,  8:03 AM

## 2014-09-28 NOTE — Progress Notes (Signed)
Pt discharge home with wife. 29f foley catheter place per order. Instructions about catheter care, leg bag, s/s of infection were given. Pt and wife VU.

## 2014-10-06 ENCOUNTER — Encounter: Payer: Self-pay | Admitting: Vascular Surgery

## 2014-10-07 ENCOUNTER — Encounter: Payer: Self-pay | Admitting: Vascular Surgery

## 2014-10-07 ENCOUNTER — Ambulatory Visit (INDEPENDENT_AMBULATORY_CARE_PROVIDER_SITE_OTHER): Payer: Self-pay | Admitting: Vascular Surgery

## 2014-10-07 VITALS — BP 143/86 | HR 78 | Ht 65.0 in | Wt 216.3 lb

## 2014-10-07 DIAGNOSIS — IMO0002 Reserved for concepts with insufficient information to code with codable children: Secondary | ICD-10-CM

## 2014-10-07 DIAGNOSIS — I639 Cerebral infarction, unspecified: Secondary | ICD-10-CM

## 2014-10-07 DIAGNOSIS — I6521 Occlusion and stenosis of right carotid artery: Secondary | ICD-10-CM

## 2014-10-07 NOTE — Addendum Note (Signed)
Addended by: Veta Dambrosia K on: 10/07/2014 10:43 AM   Modules accepted: Orders  

## 2014-10-07 NOTE — Progress Notes (Signed)
Postoperative Visit   History of Present Illness  Johnathan Keith is a 53 y.o. male who presents for postoperative follow-up for: R CEA for sx R ICA stenosis (Date: 09/26/14).  The patient's neck incision is healed.  The patient has had no stroke or TIA symptoms.  He has had resolving jawline numbness and one episode of R posterior auricular pain.  This has resolved at this point.  For VQI Use Only  PRE-ADM LIVING: Home  AMB STATUS: Ambulatory  History   Social History  . Marital Status: Married    Spouse Name: N/A    Number of Children: 5  . Years of Education: 12th   Occupational History  . Disabled    Social History Main Topics  . Smoking status: Never Smoker   . Smokeless tobacco: Not on file  . Alcohol Use: No  . Drug Use: No  . Sexual Activity: Yes    Partners: Female   Other Topics Concern  . Not on file   Social History Narrative   Patient lives with his family    Patient is right handed   Patient coffee     Current Outpatient Prescriptions on File Prior to Visit  Medication Sig Dispense Refill  . amLODipine (NORVASC) 5 MG tablet Take 5 mg by mouth daily.      Marland Kitchen aspirin 325 MG tablet Take 325 mg by mouth daily.      Marland Kitchen atorvastatin (LIPITOR) 40 MG tablet Take 40 mg by mouth at bedtime.      . Dapagliflozin Propanediol (FARXIGA) 10 MG TABS Take 10 mg by mouth daily.       Marland Kitchen dicyclomine (BENTYL) 10 MG capsule Take 10 mg by mouth 2 (two) times daily.      . Diphenoxylate-Atropine (LOMOTIL PO) Take 1 tablet by mouth 2 (two) times daily as needed (For diarrhea).       . diphenoxylate-atropine (LOMOTIL) 2.5-0.025 MG per tablet       . furosemide (LASIX) 80 MG tablet Take 80 mg by mouth daily.      . insulin NPH-regular Human (NOVOLIN 70/30) (70-30) 100 UNIT/ML injection Inject 50 Units into the skin See admin instructions. Take 50 units with breakfast and dinner,  May take 50 units at bedtime as needed      . lisinopril (PRINIVIL,ZESTRIL) 40 MG tablet Take 40  mg by mouth daily.      . metoprolol tartrate (LOPRESSOR) 25 MG tablet Take 25 mg by mouth 2 (two) times daily.      . mupirocin ointment (BACTROBAN) 2 %       . omeprazole (PRILOSEC) 20 MG capsule Take 20 mg by mouth daily.      Marland Kitchen oxyCODONE (ROXICODONE) 5 MG immediate release tablet Take 1 tablet (5 mg total) by mouth every 6 (six) hours as needed.  30 tablet  0  . potassium chloride SA (K-DUR,KLOR-CON) 20 MEQ tablet Take 20 mEq by mouth daily.      . sitaGLIPtin (JANUVIA) 100 MG tablet Take 100 mg by mouth daily.      . tamsulosin (FLOMAX) 0.4 MG CAPS capsule Take 1 capsule (0.4 mg total) by mouth daily.  30 capsule  1  . testosterone cypionate (DEPOTESTOTERONE CYPIONATE) 200 MG/ML injection Inject into the muscle every 14 (fourteen) days. As directed      . VITAMIN D, CHOLECALCIFEROL, PO Take 1 tablet by mouth daily.       No current facility-administered medications on file prior to visit.  Physical Examination  Filed Vitals:   10/07/14 0927  BP: 143/86  Pulse:     R Neck: Incision is healed, residual Dermabond  Neuro: CN 2-12 are intact , Motor strength is 5/5 bilaterally, sensation is grossly intact  Medical Decision Making  Johnathan Keith is a [age] y.o. male who presents s/p R CEA for sx R ICA complicated by urinary retention.  The patient's neck incision is healing with no stroke symptoms.  He is following up with his Urology in his hometown for urinary retention since his operation.  His foley was recently d/c by Urology.. I discussed in depth with the patient the nature of atherosclerosis, and emphasized the importance of maximal medical management including strict control of blood pressure, blood glucose, and lipid levels, obtaining regular exercise, anti-platelet use and cessation of smoking.   The patient is currently on an antiplatelet: ASA. The patient is currently on a statin: Lipitor. The patient is aware that without maximal medical management the underlying  atherosclerotic disease process will progress, limiting the benefit of any interventions. The patient's surveillance will included routine carotid duplex studies which will be completed in: 6 months, at which time the patient will be re-evaluated.   I emphasized the importance of routine surveillance of the carotid arteries as recurrence of stenosis is possible, especially with proper management of underlying atherosclerotic disease. The patient agrees to participate in their maximal medical care and routine surveillance.  Thank you for allowing us to participate in this patient's care.  Leonides SakeBrian Chen, MD [facility] [place] Office: [phone] Pager: [phone]

## 2014-10-17 ENCOUNTER — Ambulatory Visit (INDEPENDENT_AMBULATORY_CARE_PROVIDER_SITE_OTHER): Payer: No Typology Code available for payment source

## 2014-10-17 VITALS — BP 143/88 | HR 90 | Resp 12

## 2014-10-17 DIAGNOSIS — B351 Tinea unguium: Secondary | ICD-10-CM

## 2014-10-17 DIAGNOSIS — E1140 Type 2 diabetes mellitus with diabetic neuropathy, unspecified: Secondary | ICD-10-CM

## 2014-10-17 DIAGNOSIS — M79676 Pain in unspecified toe(s): Secondary | ICD-10-CM

## 2014-10-17 NOTE — Progress Notes (Signed)
   Subjective:    Patient ID: Johnathan Keith, male    DOB: February 21, 1961, 53 y.o.   MRN: [id number]  HPI  PT STATED B/L GREAT TOENAIL BEEN SORE, THOBING, AND HAVE DISCOLORATION FOR 3 YEARS. TOENAILS ARE BEEN THE SAME BUT GET WORSE WHEN TOENAILS GROWS BACK. THE TOENAILS GET AGGRAVATED BY PRESSURE. TRIED TO KEEP AND USING LAMISIL FOR DISCOLORATION FOR 3 WEEKS.  Review of Systems  HENT: Positive for trouble swallowing.   Eyes: Positive for pain, redness, itching and visual disturbance.  Respiratory: Positive for cough.   Cardiovascular: Positive for leg swelling.  Gastrointestinal: Positive for diarrhea and constipation.  Musculoskeletal: Positive for gait problem.  Neurological: Positive for weakness, numbness and headaches.       Objective:   Physical Exam 53 year old white male well-developed well-nourished oriented 3 presents this time for initial visit. Patient has ingrowing nails with yellowing discoloration and brittleness waiting on debridement is been doing self-care for them however there is getting more and more ingrown particular both great toenails. Recently was placed on oral Lamisil for fungal treatment is currently taking tablets once daily advised she is to maintain those for at least a 3 month duration. Lower extremity objective findings as follows vascular status is intact DP +2 over 4 bilateral PT thready at one over 4 bilateral capillary refill time 3 seconds epicritic and proprioceptive sensations grossly diminished absent bilateral forefoot digits and arch. There is normal plantar response DTRs not elicited dermatologically skin color pigment normal hair growth absent nails criptotic incurvated friable discolored brittle tender hallux bilateral most painful symptomatic the adjacent nails 234 and 5 on the right and 2 and 5 on the left are also involved thickening discoloration and friability consistent with severe onychomycosis. No open wounds no secondary infections patient's  most recent A1c was then 9.2 or higher level been running in the 2 and 300s recently just changes medication for his diabetes management and should be improving.      Assessment & Plan:  Assessment this time is diabetes history peripheral neuropathy and angiopathy mycotic nails are noted patient is RE on oral through for mycotic nails will initiate topical antifungal therapy as such as Fungi-Nail daily of the painful mycotic criptotic incurvated ingrowing nails are debrided 1 through 5 bilateral at this time we'll follow-up in the next 2-3 months for continued palliative care may be candidate for partial nail excision at some point in the future however we will address that if the future if symptoms persist or worsen. Both oral and topical antifungal therapies nails are debrided painful symptomatically criptotic nails lateral borders of both hallux are treated with lumicain and Neosporin. Patient will be recheck in 2-3 months for follow-upmay be candidate for partial permanent nail excision some point in the future however we'll decide that based on progress improvement with topical and oral antifungal therapies reappointed in 2-3 months next  Alvan Dame DPM

## 2014-10-17 NOTE — Patient Instructions (Signed)
Onychomycosis/Fungal Toenails  WHAT IS IT? An infection that lies within the keratin of your nail plate that is caused by a fungus.  WHY ME? Fungal infections affect all ages, sexes, races, and creeds.  There may be many factors that predispose you to a fungal infection such as age, coexisting medical conditions such as diabetes, or an autoimmune disease; stress, medications, fatigue, genetics, etc.  Bottom line: fungus thrives in a warm, moist environment and your shoes offer such a location.  IS IT CONTAGIOUS? Theoretically, yes.  You do not want to share shoes, nail clippers or files with someone who has fungal toenails.  Walking around barefoot in the same room or sleeping in the same bed is unlikely to transfer the organism.  It is important to realize, however, that fungus can spread easily from one nail to the next on the same foot.  HOW DO WE TREAT THIS?  There are several ways to treat this condition.  Treatment may depend on many factors such as age, medications, pregnancy, liver and kidney conditions, etc.  It is best to ask your doctor which options are available to you.  1. No treatment.   Unlike many other medical concerns, you can live with this condition.  However for many people this can be a painful condition and may lead to ingrown toenails or a bacterial infection.  It is recommended that you keep the nails cut short to help reduce the amount of fungal nail. 2. Topical treatment.  These range from herbal remedies to prescription strength nail lacquers.  About 40-50% effective, topicals require twice daily application for approximately 9 to 12 months or until an entirely new nail has grown out.  The most effective topicals are medical grade medications available through physicians offices. 3. Oral antifungal medications.  With an 80-90% cure rate, the most common oral medication requires 3 to 4 months of therapy and stays in your system for a year as the new nail grows out.  Oral  antifungal medications do require blood work to make sure it is a safe drug for you.  A liver function panel will be performed prior to starting the medication and after the first month of treatment.  It is important to have the blood work performed to avoid any harmful side effects.  In general, this medication safe but blood work is required. 4. Laser Therapy.  This treatment is performed by applying a specialized laser to the affected nail plate.  This therapy is noninvasive, fast, and non-painful.  It is not covered by insurance and is therefore, out of pocket.  The results have been very good with a 80-95% cure rate.  The [facility] is the only practice in the area to offer this therapy. 5. Permanent Nail Avulsion.  Removing the entire nail so that a new nail will not grow back.  Continue with the Lamisil tablets to your ready taking taking 1 tablet daily for a total of 90 days minimum  As an adjunct to the pills wash socks and hot water use bleach, spray all shoes with antifungal spray such as Tinactin or Desenex spray.  Also obtain Fungi-Nail at any pharmacy without the need for prescription apply to affected toenails once digit once or twice daily as instructed for 6-12 months until nails have resolved are healed

## 2014-10-20 ENCOUNTER — Telehealth: Payer: Self-pay | Admitting: Cardiology

## 2014-10-20 ENCOUNTER — Telehealth: Payer: Self-pay

## 2014-10-20 NOTE — Telephone Encounter (Signed)
Pt is returning Kathryn's call in reference to some stress test results. Please call  Thanks

## 2014-10-20 NOTE — Telephone Encounter (Signed)
Phone call to pt's wife to advise have not received recommendation from Dr. [name] yet.  Wife reported that the pt. Has had these headaches in the back of his head and radiating to the right side of neck and right jaw, since his surgery.  Reported that Caroline More, NP, at the PCP's office instructed that he should report his continued headaches to Dr. [name], since he just had the right carotid surgery. Advised will discuss with Dr. [name] in AM, but to take pt. To the ER if his headache is severe.  Verb. Understanding.

## 2014-10-20 NOTE — Telephone Encounter (Signed)
lmom 

## 2014-10-20 NOTE — Telephone Encounter (Signed)
Phone call from pt's wife.  Reported he has had headaches since his surgery.  Reported he started having them before he left the hospital.  Stated that he spoke to Dr. [name], about the headaches, at his f/u office visit on 10/07/14.  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 has been in bed the past 2 days, and doesn't tolerate any light; c/o nausea.  Reported pt. Has his usual weakness of the left side, that was residual from his stroke in March 2013.  Also, has had balance problems since his previous stroke; denies any increase or change in balance issues.  Denies any c/o visual disturbance.  Denies dizziness.  Stated he does report that he "feels confused with the headaches."   Reported he is taking Ibuprofen 4 tablets, for the headaches.  Wife questioned about any other pain medication that could be prescribed.  Advised that pt. would need to be reevaluated by Dr. [name], before any pain medication would be given.  Will notify Dr. [name] for any other recommendations, and return call to wife.

## 2014-10-20 NOTE — Telephone Encounter (Signed)
I spoke with patient and gave myoview results. Office visit made with Dr Allyson Sabal 11/13

## 2014-10-21 NOTE — Telephone Encounter (Signed)
spoke with Dr. [name] regarding pt's complaints of having "migrane" headaches, and of wife noting that the headaches are located at the back of his head, and radiate to the right neck and jaw.  Per Dr. [name], the headaches are not related to the surgery.  Dr. [name] explained that if the headaches were related to "hyperperfusion of the brain", his blood pressure would be extremely elevated, with SBP 180.  Stated he saw him in follow-up on 10/30, and his BP was normal.  Advised that he go to his PCP for further evaluation/ treatment of headaches.  Notified pt's wife of Dr. Nicky Pugh recommendation.  Will fax Triage note to PCP office.

## 2014-10-26 ENCOUNTER — Encounter: Payer: Self-pay | Admitting: Cardiovascular Disease

## 2014-10-26 ENCOUNTER — Ambulatory Visit (INDEPENDENT_AMBULATORY_CARE_PROVIDER_SITE_OTHER): Payer: No Typology Code available for payment source | Admitting: Cardiovascular Disease

## 2014-10-26 VITALS — BP 103/64 | HR 76 | Ht 65.0 in | Wt 221.4 lb

## 2014-10-26 DIAGNOSIS — E785 Hyperlipidemia, unspecified: Secondary | ICD-10-CM

## 2014-10-26 DIAGNOSIS — R931 Abnormal findings on diagnostic imaging of heart and coronary circulation: Secondary | ICD-10-CM

## 2014-10-26 DIAGNOSIS — I10 Essential (primary) hypertension: Secondary | ICD-10-CM

## 2014-10-26 DIAGNOSIS — R9439 Abnormal result of other cardiovascular function study: Secondary | ICD-10-CM

## 2014-10-26 MED ORDER — LISINOPRIL 20 MG PO TABS: 20.0000 mg | ORAL_TABLET | Freq: Every day | ORAL | Status: DC

## 2014-10-26 NOTE — Progress Notes (Signed)
10/26/2014 Johnathan Keith   1961-05-01  [id number]  Primary Physician Caroline More, PA-C Primary Cardiologist: Runell Gess MD [name]   HPI:  [name] is a delightful 53 year old mildly overweight married Caucasian male father of 5 children, grandfather and 6 grandchildren referred by Dr. [name] for preoperative surgical clearance before elective right carotid endarterectomy. His Primary care provider is  [employer] PA-C in [facility]. 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 His cardiac risk factor profile is notable for hypertension, diabetes and hyperlipidemia. There is a question of remote myocardial infarction in the past. He did have a TIA back/31/14 and has residual left-sided motor deficits. Recent carotid Dopplers revealed high-grade right internal carotid artery stenosis.he underwent uncomplicated right carotid endarterectomy performed by Dr. [name] on 09/26/14. A 2-D echo done preop showed normal LV systolic function and a Myoview stress test was low risk did have mild anteroapical and inferobasal hypokinesia. He denies chest pain or shortness of breath.   Current Outpatient Prescriptions  Medication Sig Dispense Refill  . amLODipine (NORVASC) 5 MG tablet Take 5 mg by mouth daily.    Marland Kitchen aspirin 325 MG tablet Take 325 mg by mouth daily.    . Aspirin-Acetaminophen-Caffeine (EXCEDRIN MIGRAINE PO) Take 2 tablets by mouth as needed (for migraine).    Marland Kitchen atorvastatin (LIPITOR) 40 MG tablet Take 40 mg by mouth at bedtime.    . bethanechol (URECHOLINE) 25 MG tablet     . ciprofloxacin (CIPRO) 500 MG tablet     . Dapagliflozin Propanediol (FARXIGA) 10 MG TABS Take 10 mg by mouth daily.     Marland Kitchen dicyclomine (BENTYL) 10 MG capsule Take 10 mg by mouth 2 (two) times daily.    . Diphenoxylate-Atropine (LOMOTIL PO) Take 1 tablet by mouth 2 (two) times daily as needed (For diarrhea).     .  diphenoxylate-atropine (LOMOTIL) 2.5-0.025 MG per tablet     . furosemide (LASIX) 80 MG tablet Take 80 mg by mouth daily.    . insulin NPH-regular Human (NOVOLIN 70/30) (70-30) 100 UNIT/ML injection Inject 50 Units into the skin See admin instructions. Take 50 units with breakfast and dinner,  May take 50 units at bedtime as needed    . lisinopril (PRINIVIL,ZESTRIL) 20 MG tablet Take 1 tablet (20 mg total) by mouth daily. 30 tablet 11  . metoprolol tartrate (LOPRESSOR) 25 MG tablet Take 25 mg by mouth 2 (two) times daily.    Marland Kitchen omeprazole (PRILOSEC) 20 MG capsule Take 20 mg by mouth daily.    . potassium chloride SA (K-DUR,KLOR-CON) 20 MEQ tablet Take 20 mEq by mouth daily.    . sitaGLIPtin (JANUVIA) 100 MG tablet Take 100 mg by mouth daily.    . tamsulosin (FLOMAX) 0.4 MG CAPS capsule Take 1 capsule (0.4 mg total) by mouth daily. 30 capsule 1  . terbinafine (LAMISIL) 250 MG tablet Take 250 mg by mouth daily.    Marland Kitchen testosterone cypionate (DEPOTESTOTERONE CYPIONATE) 200 MG/ML injection Inject into the muscle every 14 (fourteen) days. As directed    . VITAMIN D, CHOLECALCIFEROL, PO Take 1 tablet by mouth daily.     No current facility-administered medications for this visit.    No Known Allergies  History   Social History  . Marital Status: Married    Spouse Name: N/A    Number of Children: 5  . Years of Education: 12th   Occupational History  .  Disabled    Social History Main Topics  . Smoking status: Never Smoker   . Smokeless tobacco: Not on file  . Alcohol Use: No  . Drug Use: No  . Sexual Activity:    Partners: Female   Other Topics Concern  . Not on file   Social History Narrative   Patient lives with his family    Patient is right handed   Patient coffee      Review of Systems: General: negative for chills, fever, night sweats or weight changes.  Cardiovascular: negative for chest pain, dyspnea on exertion, edema, orthopnea, palpitations, paroxysmal nocturnal  dyspnea or shortness of breath Dermatological: negative for rash Respiratory: negative for cough or wheezing Urologic: negative for hematuria Abdominal: negative for nausea, vomiting, diarrhea, bright red blood per rectum, melena, or hematemesis Neurologic: negative for visual changes, syncope, or dizziness All other systems reviewed and are otherwise negative except as noted above.    Blood pressure 103/64, pulse 76, height 5\' 5"  (1.651 m), weight 221 lb 6.4 oz (100.426 kg).  General appearance: alert and no distress Neck: no adenopathy, no carotid bruit, no JVD, supple, symmetrical, trachea midline, thyroid not enlarged, symmetric, no tenderness/mass/nodules and recent right carotid endarterectomy healing incision Lungs: clear to auscultation bilaterally Heart: regular rate and rhythm, S1, S2 normal, no murmur, click, rub or gallop Extremities: extremities normal, atraumatic, no cyanosis or edema  EKG not performed today  ASSESSMENT AND PLAN:   Carotid artery stenosis with cerebral infarction over 8 weeks ago Status post successful uncomplicated right carotid endarterectomy performed by Dr. Leonides SakeBrian Chen 09/26/14.  HTN (hypertension) Blood pressure today is 103/64. He is alert lisinopril 40 mg and metoprolol. He is also on MP and 5 mg. He says that his blood pressure gets down to below 90 systolic which time he is somewhat symptomatic. I'm going to cut back his lisinopril from 40-20 mg a day and have him keep a blood pressure log for one month daily. He'll see Belenda CruiseKristin back in 30 days to review his blood pressure log and make any further recommendations.  HLD (hyperlipidemia) History of hyperlipidemia on atorvastatin 40 mg. His recent lipid profile performed 07/27/14 revealed an LDL of 84 and HDL of 30. He does admit to dietary indiscretion and is close to being at goal for secondary prevention. Talked about dietary modification.  Abnormal nuclear stress test The patient had a Myoview  stress test performed preoperatively prior to his elective endarterectomy. This was low risk study however it did show mild anteroapical and inferobasal hypoperfusion. While this did not meet the level of significance that would have necessitated invasive investigation prior to his surgery it does raise the issue that he may have subclinical myocardial ischemia. We have talked about aggressive risk factor modification. I have told him that should he develop any symptoms such as chest pain pressure or shortness of breath he should come back to see me otherwise L seen back on an annual basis.      Runell GessJonathan J. Aija Scarfo MD FACP,FACC,FAHA, [facility] 10/26/2014 9:31 AM

## 2014-10-26 NOTE — Assessment & Plan Note (Signed)
The patient had a Myoview stress test performed preoperatively prior to his elective endarterectomy. This was low risk study however it did show mild anteroapical and inferobasal hypoperfusion. While this did not meet the level of significance that would have necessitated invasive investigation prior to his surgery it does raise the issue that he may have subclinical myocardial ischemia. We have talked about aggressive risk factor modification. I have told him that should he develop any symptoms such as chest pain pressure or shortness of breath he should come back to see me otherwise L seen back on an annual basis.

## 2014-10-26 NOTE — Assessment & Plan Note (Signed)
History of hyperlipidemia on atorvastatin 40 mg. His recent lipid profile performed 07/27/14 revealed an LDL of 84 and HDL of 30. He does admit to dietary indiscretion and is close to being at goal for secondary prevention. Talked about dietary modification.

## 2014-10-26 NOTE — Assessment & Plan Note (Signed)
Blood pressure today is 103/64. He is alert lisinopril 40 mg and metoprolol. He is also on MP and 5 mg. He says that his blood pressure gets down to below 90 systolic which time he is somewhat symptomatic. I'm going to cut back his lisinopril from 40-20 mg a day and have him keep a blood pressure log for one month daily. He'll see Belenda Cruise back in 30 days to review his blood pressure log and make any further recommendations.

## 2014-10-26 NOTE — Assessment & Plan Note (Signed)
Status post successful uncomplicated right carotid endarterectomy performed by Dr. [name] 09/26/14.

## 2014-10-26 NOTE — Patient Instructions (Signed)
  We will see you back in follow up in 1 month with [facility] for a blood pressure check and 1 year with Dr Allyson Sabal.  Dr Allyson Sabal has ordered: Decrease Lisinopril to 20mg  daily (1/2 tablet of 40mg  daily)

## 2014-11-09 ENCOUNTER — Ambulatory Visit (INDEPENDENT_AMBULATORY_CARE_PROVIDER_SITE_OTHER): Payer: No Typology Code available for payment source | Admitting: Neurology

## 2014-11-09 ENCOUNTER — Encounter: Payer: Self-pay | Admitting: *Deleted

## 2014-11-09 ENCOUNTER — Encounter: Payer: Self-pay | Admitting: Neurology

## 2014-11-09 VITALS — BP 121/72 | HR 74 | Ht 65.0 in | Wt 226.4 lb

## 2014-11-09 DIAGNOSIS — I639 Cerebral infarction, unspecified: Secondary | ICD-10-CM

## 2014-11-09 DIAGNOSIS — I10 Essential (primary) hypertension: Secondary | ICD-10-CM

## 2014-11-09 DIAGNOSIS — I6521 Occlusion and stenosis of right carotid artery: Secondary | ICD-10-CM

## 2014-11-09 DIAGNOSIS — E785 Hyperlipidemia, unspecified: Secondary | ICD-10-CM

## 2014-11-09 NOTE — Progress Notes (Signed)
STROKE NEUROLOGY FOLLOW UP NOTE  NAME: Johnathan Keith DOB: 20-Mar-1961  REASON FOR VISIT: stroke follow up HISTORY FROM: pt and chart  Today we had the pleasure of seeing Johnathan Keith in follow-up at our Neurology Clinic. Pt was accompanied by daughter.   History Summary Johnathan Keith presents today as clinic follow up of stroke and right carotid stenosis > 70% in 2014. He had left hemiparesis in 2014 but CT and MRI did not show acute stroke, however, by my read, it is questionable a small DWI change at right pons without corresponding ADC attenuation. He has multiple stroke risk factors including HTN, DM, HLD, and right carotid stenosis.  He is on ASA and lipitor now for stroke prevention.  08/04/14 follow up - During the interval time, the patient has been doing fine without changes. He has seen his PCP and made changes for his DM regimen since his A1c was very high.    His right carotid stenosis > 70% a year ago without intervention, at that time his ICA/CCA ratio was 3.19, as compared to his left ICA/CCA ratio 1.02.  During the interval, we repeated CUS which showed right proximal/mid ICA >70% stenosis, the right ICA/CCA ratio 7.06 and left ICA/CCA ratio 0.95. The right ICA stenosis progressed from one year ago. He also complains that he has finger and toes tingling numbness as well as distal extremities cold with purple color from time to time.   Interval History During the interval time, he has followed up with Dr. [name] and had right CEA done on 09/26/14. Since then, he has intermittent chocking on eating solid food or drinking liquid. He is going to see GI next week. He also followed up with cardiology for low BP and his lisinopril decreased from 40mg  daily to 20mg  daily. He stated that his latest A1C check was 9.9 down from previous 12.3. His BP was well controlled, today in clinic 121/72.   He stated that he has migraine hx and could happen once a week, if not taking medications it  may last 2 days. But if takes Excedrin early in the course, it may last only 1hr after lying down and sleep. He continued to complains of tingling bilateral fingertips and toes. Denies smoking.     REVIEW OF SYSTEMS: Full 14 system review of systems performed and notable only for those listed below and in HPI above, all others are negative:  Constitutional: N/A  Cardiovascular: leg swelling  Ear/Nose/Throat: facial swelling, ear discharge Skin: N/A  Eyes: eye discharge, eye itching, eye redness, blurry vision  Respiratory: cough  Gastroitestinal: constipation, diarrhea, nausea, bowel incontinence  Genitourinary: N/A Hematology/Lymphatic: N/A  Endocrine: N/A  Musculoskeletal: N/A  Allergy/Immunology: N/A  Neurological: walking difficulty, neck pain,dizziness, HA, numbness Psychiatric: insomnia, frequent waking  The following represents the patient's updated allergies and side effects list: No Known Allergies  The neurologically relevant items on the patient's problem list were reviewed on today's visit.  Neurologic Examination  A problem focused neurological exam (12 or more points of the single system neurologic examination, vital signs counts as 1 point, cranial nerves count for 8 points) was performed.  Blood pressure 121/72, pulse 74, height 5\' 5"  (1.651 m), weight 226 lb 6.4 oz (102.694 kg).  General - Well nourished, well developed, in no apparent distress.  Ophthalmologic - Sharp disc margins OU.  Cardiovascular - Regular rate and rhythm with no murmur. Carotid pulses were 2+ without bruits.  Neck - supple, no nuchal rigidity.  Mental  Status -  Level of arousal and orientation to time, place, and person were intact.  Language including expression, naming, repetition, comprehension was assessed and found intact.  Attention span and concentration were normal.  Recent and remote memory were intact.  Fund of Knowledge was assessed and was intact.  Cranial Nerves II - XII -    II - Vision intact OU.  III, IV, VI - Extraocular movements intact.  V - Facial sensation decreased on the left.  VII - Facial movement intact bilaterally.  VIII - Hearing & vestibular intact bilaterally.  X - Palate elevates symmetrically.  XI - Chin turning & shoulder shrug intact bilaterally.  XII - Tongue protrusion intact.  Motor Strength - The patient's strength was normal in all extremities and pronator drift was absent. Bulk was normal and fasciculations were absent.  Motor Tone - Muscle tone was assessed at the neck and appendages and was normal.  Reflexes - The patient's reflexes were normal in all extremities and he had no pathological reflexes.  Sensory - Light touch, temperature/pinprick were assessed and were decreased on left UE.  Coordination - The patient had normal movements in the hands and feet with no ataxia or dysmetria. Tremor was absent.  Gait and Station - very mild left hemiparetic gait (left leg dragging).  Images and labs:  I have personally reviewed the radiological images below and agree with the radiology interpretations.  CTA neck - Atherosclerotic disease in the right carotid bifurcation. 15 mm above the bifurcation, focal severe stenosis measuring 75% diameter stenosis Mild atherosclerotic disease left carotid bifurcation without significant stenosis Both vertebral arteries widely patent.  CUS - 80-99% right ICA stenosis.  2D echo - - Left ventricle: The cavity size was normal. Wall thickness was increased in a pattern of mild LVH. Systolic function was normal. The estimated ejection fraction was in the range of 55% to 60%. Wall motion was normal; there were no regional wall motion abnormalities. Doppler parameters are consistent with abnormal left ventricular relaxation (grade 1 diastolic dysfunction). The E/e&' ratio is >15, suggesting elevated LV filling pressure. - Aortic valve: Sclerosis without stenosis. There was trace to  mild regurgitation. - Left atrium: LA volume/ BSA = 20.7 ml/m2. The atrium was normal in size. - Tricuspid valve: There was trivial regurgitation. - Pulmonary arteries: PA peak pressure: 24 mm Hg (S). Impressions: - LVEF 55-60%, mild LVH, normal wall motion, diastolic dysfunction, elevated LV filling pressure, normal LA size, aortic valve sclerosis with trace to mild AI.  Component     Latest Ref Rng 07/27/2014  Cholesterol, Total     100 - 199 mg/dL 811  Triglycerides     0 - 149 mg/dL 914 (H)  HDL     >78 mg/dL 30 (L)  VLDL Cholesterol Cal     5 - 40 mg/dL 42 (H)  LDL (calc)     0 - 99 mg/dL 84  Total CHOL/HDL Ratio     0.0 - 5.0 ratio units 5.2 (H)  TSH     0.450 - 4.500 uIU/mL 2.860  Free T4     0.82 - 1.77 ng/dL 2.95  Hgb A2Z MFr Bld     4.8 - 5.6 % 12.3 (H)  Est. average glucose Bld gHb Est-mCnc      306    Assessment: As you may recall, he is a 53 y.o. Caucasian male with PMH of HTN, HLD, DM and right ICA stenosis followed up in clinic today for stroke and right  ICA stenosis. He had left hemiparesis in 2014 but CT and MRI did not show acute stroke, however, by my read, it is questionable a small DWI change at right pons without corresponding ADC attenuation. Regardless, he has multiple stroke risk factors including HTN, DM, HLD, and right carotid stenosis. He is on ASA and lipitor now for stroke prevention. He needs to continue follow up with his PCP for stroke risk factor modification and control BP, glucose and LDL.   He also has right carotid stenosis at more than 70% one year ago without intervention. We repeat carotid doppler in clinic, showed progression of the right ICA stenosis. He had left hemiparesis last year, and it could be a right hemisphere stroke vs. Brainstem stroke. Although not able to for sure say asymptomatic or symptomatic carotid stenosis. Had right CEA by Dr. Imogene Burnhen and followed up with cardiology adjusted BP meds for hypotension. He also has  intermittent chocking since the surgery and he will follow up with GI.   Plan:  - continue ASA and lipitor for stroke prevention - follow up with PCP for stroke risk factor modification - follow up with Dr. Imogene Burnhen and Dr. Allyson SabalBerry - follow up with GI for chocking after surgery - Follow up with your primary care physician for stroke risk factor modification. Recommend maintain blood pressure goal <130/80, diabetes with hemoglobin A1c goal below 6.5% and lipids with LDL cholesterol goal below 70 mg/dL. \ - continue to monitor BP and glucose level at home closely - RTC in 3 months.  No orders of the defined types were placed in this encounter.    Patient Instructions  - continue ASA and lipitor for stroke prevention - follow up with Dr. Imogene Burnhen and Dr. Allyson SabalBerry for regular follow up - Follow up with your primary care physician for stroke risk factor modification. Recommend maintain blood pressure goal <130/80, diabetes with hemoglobin A1c goal below 6.5% and lipids with LDL cholesterol goal below 70 mg/dL.  - check BP at home. - follow up with GI for chocking evaluation - follow up in 3 months.    Marvel PlanJindong Morio Widen, MD PhD [facility] [address], [address] [place], [place] [id number] [phone]

## 2014-11-09 NOTE — Patient Instructions (Addendum)
-   continue ASA and lipitor for stroke prevention - follow up with Dr. [name] and Dr. Allyson Sabal for regular follow up - Follow up with your primary care physician for stroke risk factor modification. Recommend maintain blood pressure goal <130/80, diabetes with hemoglobin A1c goal below 6.5% and lipids with LDL cholesterol goal below 70 mg/dL.  - check BP at home. - follow up with GI for chocking evaluation - follow up in 3 months.

## 2014-11-25 ENCOUNTER — Encounter: Payer: Self-pay | Admitting: Pharmacist Clinician (PhC)/ Clinical Pharmacy Specialist

## 2014-11-25 ENCOUNTER — Ambulatory Visit (INDEPENDENT_AMBULATORY_CARE_PROVIDER_SITE_OTHER)
Payer: No Typology Code available for payment source | Admitting: Pharmacist Clinician (PhC)/ Clinical Pharmacy Specialist

## 2014-11-25 VITALS — BP 140/82 | HR 84 | Ht 65.0 in | Wt 225.0 lb

## 2014-11-25 DIAGNOSIS — I10 Essential (primary) hypertension: Secondary | ICD-10-CM

## 2014-11-25 NOTE — Assessment & Plan Note (Addendum)
Johnathan Keith is still at goal, after decreasing lisinopril from 40 mg daily to 20 mg.  We reviewed [name] goal (<140/80 for DM).   He was concerned about decreasing the dose of lisinopril, as he knows ACEIs are renal protective and he is afraid that he now has only half as much protection.  I assured him this was not so.  Looking at the readings he brought in, there was a trend based on blood sugars.  This could be a challenge to control [name], as his blood sugars are poorly controlled. If there truly is a pattern of low BS/low Keith, it could be that the weakness/dizziness is more related to the low BS.  I have asked that he check [name] and blood sugars daily and keep a log.  He is to return in 1 month for follow up.

## 2014-11-25 NOTE — Patient Instructions (Signed)
Return for a a follow up appointment in 1 month  Your blood pressure today is 140/82  Check your blood pressure at home daily (if able) and keep record of the readings.  Record your blood sugar along with blood pressure  Take your BP meds as follows: lisinopril 20 mg each morning, amlodipine 5 mg each evening, metoprolol 25 mg twice daily  Bring all of your meds, your BP cuff and your record of home blood pressures to your next appointment.  Exercise as you're able, try to walk approximately 30 minutes per day.  Keep salt intake to a minimum, especially watch canned and prepared boxed foods.  Eat more fresh fruits and vegetables and fewer canned items.  Avoid eating in fast food restaurants.    HOW TO TAKE YOUR BLOOD PRESSURE: . Rest 5 minutes before taking your blood pressure. .  Don't smoke or drink caffeinated beverages for at least 30 minutes before. . Take your blood pressure before (not after) you eat. . Sit comfortably with your back supported and both feet on the floor (don't cross your legs). . Elevate your arm to heart level on a table or a desk. . Use the proper sized cuff. It should fit smoothly and snugly around your bare upper arm. There should be enough room to slip a fingertip under the cuff. The bottom edge of the cuff should be 1 inch above the crease of the elbow. . Ideally, take 3 measurements at one sitting and record the average.

## 2014-11-25 NOTE — Progress Notes (Signed)
11/25/2014 Johnathan RoyalsGrady Hausen [birth year] [id number]   HPI:  Johnathan Keith is a 53 y.o. male patient of Dr Allyson SabalBerry, with a PMH below who presents today for hypertension clinic evaluation.  He has had difficulty with compliance in his medications over the past year or so because of costs, he does not have good drug coverage.  He has discontinued most of his diabetes medications unless given samples, and [facility]  Last A1c was 9, previously 12.3.  Pt has peripheral neuropathy, retinopathy, gastroparesis.  He states compliance with cardiac meds, and notes that any changes will need to be generics.  Dr. Allyson SabalBerry cut his lisinopril from 40 mg to 20 mg at his last visit because BP was 103/64.  Pt states he feels dizziness/weakness with low BP readings.   Cardiac Hx: DM, hyperlipidemia, remote MI and TIA  Social Hx: no tobacco or alcohol.  Drinks 4+ cans diet soda per day, including Dr Reino KentPepper, Coke and [facility], takes Excedrin Migraine regularly  Home BP readings: low 94/56, high 162/102; pt states has noticed trend of high blood pressure when sugar is high, low BP with low sugar.  Current antihypertensive medications: amlodipine 5 mg daily, lisinopril 20 mg daily, metoprolol 25 mg bid   Current Outpatient Prescriptions  Medication Sig Dispense Refill  . amLODipine (NORVASC) 5 MG tablet Take 5 mg by mouth daily.    Marland Kitchen. aspirin 325 MG tablet Take 325 mg by mouth daily.    . Aspirin-Acetaminophen-Caffeine (EXCEDRIN MIGRAINE PO) Take 2 tablets by mouth as needed (for migraine).    Marland Kitchen. atorvastatin (LIPITOR) 40 MG tablet Take 40 mg by mouth at bedtime.    . bethanechol (URECHOLINE) 25 MG tablet Take 25 mg by mouth 2 (two) times daily.     . Dapagliflozin Propanediol (FARXIGA) 10 MG TABS Take 10 mg by mouth daily.     Marland Kitchen. dicyclomine (BENTYL) 10 MG capsule Take 10 mg by mouth 2 (two) times daily.    . diphenoxylate-atropine (LOMOTIL) 2.5-0.025 MG per tablet Take 1  tablet by mouth 2 (two) times daily as needed.     . furosemide (LASIX) 80 MG tablet Take 80 mg by mouth daily.    . insulin NPH-regular Human (NOVOLIN 70/30) (70-30) 100 UNIT/ML injection Inject 50 Units into the skin See admin instructions. Take 50 units with breakfast and dinner,  May take 50 units at bedtime as needed    . lisinopril (PRINIVIL,ZESTRIL) 20 MG tablet Take 1 tablet (20 mg total) by mouth daily. 30 tablet 11  . metoprolol tartrate (LOPRESSOR) 25 MG tablet Take 25 mg by mouth 2 (two) times daily.    . pantoprazole (PROTONIX) 40 MG tablet Take 40 mg by mouth daily.    . potassium chloride SA (K-DUR,KLOR-CON) 20 MEQ tablet Take 20 mEq by mouth daily.    . sitaGLIPtin (JANUVIA) 100 MG tablet Take 100 mg by mouth daily.    . tamsulosin (FLOMAX) 0.4 MG CAPS capsule Take 1 capsule (0.4 mg total) by mouth daily. 30 capsule 1  . terbinafine (LAMISIL) 250 MG tablet Take 250 mg by mouth daily.    Marland Kitchen. testosterone cypionate (DEPOTESTOTERONE CYPIONATE) 200 MG/ML injection Inject into the muscle every 14 (fourteen) days. As directed    . VITAMIN D, CHOLECALCIFEROL, PO Take 1 tablet by mouth daily.     No current facility-administered medications for this visit.    No Known Allergies  Past Medical History  Diagnosis  Date  . Hypertension   . Diabetes mellitus without complication   . High cholesterol   . Stroke   . Heart attack   . Diabetic retinopathy   . Peripheral vascular disease     carotid artery disease  . PONV (postoperative nausea and vomiting)     Blood pressure 140/82, pulse 84, height 5\' 5"  (1.651 m), weight 225 lb (102.059 kg).    Phillips Hay PharmD CPP [facility]

## 2014-11-30 ENCOUNTER — Other Ambulatory Visit: Payer: Self-pay | Admitting: Physician Assistant

## 2014-12-26 ENCOUNTER — Encounter: Payer: Self-pay | Admitting: Pharmacist Clinician (PhC)/ Clinical Pharmacy Specialist

## 2014-12-26 ENCOUNTER — Ambulatory Visit (INDEPENDENT_AMBULATORY_CARE_PROVIDER_SITE_OTHER)
Payer: No Typology Code available for payment source | Admitting: Pharmacist Clinician (PhC)/ Clinical Pharmacy Specialist

## 2014-12-26 VITALS — BP 158/82 | Ht 65.0 in | Wt 224.6 lb

## 2014-12-26 DIAGNOSIS — I10 Essential (primary) hypertension: Secondary | ICD-10-CM

## 2014-12-26 NOTE — Patient Instructions (Addendum)
Call if your BP trends to constantly >150 systolic  Your blood pressure today is 158/82   Check your blood pressure at home several times each week and keep record of the readings.  Take your BP meds as follows: Continue with current medications  Bring all of your meds, your BP cuff and your record of home blood pressures to your next appointment.  Exercise as you're able, try to walk approximately 30 minutes per day.  Keep salt intake to a minimum, especially watch canned and prepared boxed foods.  Eat more fresh fruits and vegetables and fewer canned items.  Avoid eating in fast food restaurants.    HOW TO TAKE YOUR BLOOD PRESSURE: . Rest 5 minutes before taking your blood pressure. .  Don't smoke or drink caffeinated beverages for at least 30 minutes before. . Take your blood pressure before (not after) you eat. . Sit comfortably with your back supported and both feet on the floor (don't cross your legs). . Elevate your arm to heart level on a table or a desk. . Use the proper sized cuff. It should fit smoothly and snugly around your bare upper arm. There should be enough room to slip a fingertip under the cuff. The bottom edge of the cuff should be 1 inch above the crease of the elbow. . Ideally, take 3 measurements at one sitting and record the average.   Take an extra furosemide tablet daily for 3 days.  If no improvement call the office and speak to one of the nurses.  Take an extra potassium tablet today only.

## 2014-12-26 NOTE — Progress Notes (Signed)
12/26/2014 Johnathan Keith 07-02-1961 [id number]   HPI:  Johnathan Keith is a 54 y.o. male patient of Dr Allyson Sabal, with a PMH below who presents today for a follow up hypertension clinic visit.  He has had difficulty with compliance in his medications over the past year or so because of costs, he does not have good drug coverage.  He has discontinued most of his diabetes medications unless given samples, and uses Novolin insulin that he can by OTC at [facility].  Last A1c was 9, previously 12.3.  Pt has peripheral neuropathy, retinopathy, gastroparesis.  He does state compliance with cardiac meds, requesting any changes to be generic. At his visit with Dr. Allyson Sabal in November his lisinopril was cut from 40 mg daily to 20 mg due to hypotension.  Pt states he feels dizziness/weakness with low BP readings, which often occur when his blood sugar is also low.  Today he brings in a list of daily BP readings, which show no correlation of low BP and low BS.  He does report feeling much better in the past month, no dizziness/weakness at all.  His home BP readings have ranged between 109-171/53-99, with the majority being in the low 140s.  He brought his home cuff in for verification today and it read about 14/20 points higher than office cuff.    Of note he complains today of increased swelling in his ankles/feet.  He is currently taking furosemide 80 mg daily and reports no missed doses.  Does note SOB on exertion.  Pitting edema noted on both calves.  Cardiac Hx: DM, hyperlipidemia, remote MI and TIA  Social Hx: no tobacco or alcohol.  Drinks 4+ cans diet soda per day, including Dr Reino Kent, Coke and [facility], takes Excedrin Migraine regularly  Current antihypertensive medications: amlodipine 5 mg daily, lisinopril 20 mg daily, metoprolol 25 mg bid   Current Outpatient Prescriptions  Medication Sig Dispense Refill  . amLODipine (NORVASC) 5 MG tablet Take 5 mg by mouth daily.    Marland Kitchen aspirin 325 MG tablet Take  325 mg by mouth daily.    . Aspirin-Acetaminophen-Caffeine (EXCEDRIN MIGRAINE PO) Take 2 tablets by mouth as needed (for migraine).    Marland Kitchen atorvastatin (LIPITOR) 40 MG tablet Take 40 mg by mouth at bedtime.    . bethanechol (URECHOLINE) 25 MG tablet Take 25 mg by mouth 2 (two) times daily.     Marland Kitchen dicyclomine (BENTYL) 10 MG capsule Take 10 mg by mouth 2 (two) times daily.    . diphenoxylate-atropine (LOMOTIL) 2.5-0.025 MG per tablet Take 1 tablet by mouth 2 (two) times daily as needed.     . furosemide (LASIX) 80 MG tablet Take 80 mg by mouth daily.    . insulin NPH-regular Human (NOVOLIN 70/30) (70-30) 100 UNIT/ML injection Inject 50 Units into the skin See admin instructions. Take 50 units with breakfast and dinner,  May take 50 units at bedtime as needed    . lisinopril (PRINIVIL,ZESTRIL) 20 MG tablet Take 1 tablet (20 mg total) by mouth daily. 30 tablet 11  . metFORMIN (GLUCOPHAGE-XR) 500 MG 24 hr tablet Take 500 mg by mouth daily.    . metoprolol tartrate (LOPRESSOR) 25 MG tablet Take 25 mg by mouth 2 (two) times daily.    Marland Kitchen omeprazole (PRILOSEC) 20 MG capsule Take 20 mg by mouth daily.    . pantoprazole (PROTONIX) 40 MG tablet Take 40 mg by mouth daily.    . potassium chloride SA (K-DUR,KLOR-CON) 20 MEQ tablet  Take 20 mEq by mouth daily.    . sitaGLIPtin (JANUVIA) 100 MG tablet Take 100 mg by mouth daily.    . tamsulosin (FLOMAX) 0.4 MG CAPS capsule Take 1 capsule (0.4 mg total) by mouth daily. 30 capsule 1  . terbinafine (LAMISIL) 250 MG tablet Take 250 mg by mouth daily.    Marland Kitchen testosterone cypionate (DEPOTESTOTERONE CYPIONATE) 200 MG/ML injection Inject into the muscle every 14 (fourteen) days. As directed    . VITAMIN D, CHOLECALCIFEROL, PO Take 1 tablet by mouth daily.     No current facility-administered medications for this visit.    No Known Allergies  Past Medical History  Diagnosis Date  . Hypertension   . Diabetes mellitus without complication   . High cholesterol   . Stroke    . Heart attack   . Diabetic retinopathy   . Peripheral vascular disease     carotid artery disease  . PONV (postoperative nausea and vomiting)     Blood pressure 158/82, height  (1.651 m), weight 224 lb 9.6 oz (101.878 kg).    Phillips Hay PharmD CPP [facility]
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
## 2014-12-29 ENCOUNTER — Telehealth: Payer: Self-pay | Admitting: Cardiovascular Disease

## 2014-12-29 NOTE — Telephone Encounter (Signed)
LMTCB

## 2014-12-29 NOTE — Telephone Encounter (Signed)
Pt Lasix was increased three days ago.He was suppose to call and talked to nurse after the 3 days.Today his blood pressure is 93/67.

## 2014-12-29 NOTE — Telephone Encounter (Signed)
BP is probably lower due to increased diuresis. I think this will equilibrate. I would just observe for now.   Peter Swaziland MD, [facility]

## 2014-12-29 NOTE — Telephone Encounter (Signed)
Advised pt based on Dr. Elvis Coil recommendation. Reminded pt of physician on-call line. Will follow up w/ patient on Monday.

## 2014-12-29 NOTE — Telephone Encounter (Signed)
Pt is taking average of 3 readings when recording his BP. Last 3 averages of these readings were:  118/78  119/76  93/67  Most recent BP taken this AM, pt reports dizziness upon standing suddenly, his biggest complaint is that he feels tired all the time.  Concerning [name]'s recommendations, pt increased Lasix for 3 days. He reports this seemed to help his foot swelling, he still has some residual swelling of the ankles, but overall this is improved.  Pt wants to know what to do concerning BP. Will route to Dr. Swaziland to advise.

## 2015-01-02 NOTE — Telephone Encounter (Deleted)
Attemp

## 2015-01-02 NOTE — Telephone Encounter (Signed)
Followed up w/ this patient who had been recently increased on Lasix and had called last week w/ low BPs.  He is taking 3 reading averages for BP at home.  He is still having issues w/ his BP. He states BP of 82/47 Friday morning, he at that time stopped taking his lisinopril, amlodipine, metoprolol, and lasix.   Pt reported feeling lightheaded and nauseous typically w. ambulation and has consequently been resting in bed a lot. He denies fever, vomiting, pain, headaches. His last couple readings have been improved. Last night was 95/60, this morning was 100/60. He has finally felt good enough to get up and walk around. He resumed lasix this AM after noting swelling in feet.   This afternoon his BP was 140/82.  He denies palpitations or noticeable pulse changes, states HR's between 75-90 but he is "not really paying attention to them".  Need best advice for this patient.

## 2015-01-04 ENCOUNTER — Telehealth: Payer: Self-pay | Admitting: Pharmacist Clinician (PhC)/ Clinical Pharmacy Specialist

## 2015-01-04 NOTE — Telephone Encounter (Signed)
Pt called, LMOM for [employer].  States no BP meds since Friday.  BP was "higher" this am, so took BP meds, now feeling worse.  Would like advise

## 2015-01-04 NOTE — Telephone Encounter (Signed)
BP 158/93 this AM. Pt took all his am BP meds and lasix. He is still having the feet swelling. Instructed to hold BP meds until o/w specified. Scheduled w/ Belenda Cruise for med mgmt visit per her request.

## 2015-01-05 NOTE — Telephone Encounter (Signed)
Needs to see a MLP sooner rather than later (w/in several days)

## 2015-01-05 NOTE — Telephone Encounter (Signed)
Needs to come in to see a MLP w/in several days

## 2015-01-06 NOTE — Telephone Encounter (Signed)
Based on Dr. Hazle Coca recommendation and discussing w/ Belenda Cruise [place], moved to [facility] calendar for same-day. Called pt to inform of appt change. Left message on machine.

## 2015-01-06 NOTE — Telephone Encounter (Signed)
Had added this patient to [name]'s calendar the other day. (to be seen next week)

## 2015-01-11 ENCOUNTER — Encounter: Payer: Self-pay | Admitting: Cardiology

## 2015-01-11 ENCOUNTER — Ambulatory Visit (INDEPENDENT_AMBULATORY_CARE_PROVIDER_SITE_OTHER): Payer: No Typology Code available for payment source | Admitting: Cardiology

## 2015-01-11 ENCOUNTER — Encounter
Payer: No Typology Code available for payment source | Admitting: Pharmacist Clinician (PhC)/ Clinical Pharmacy Specialist

## 2015-01-11 VITALS — BP 124/80 | HR 62 | Ht 66.0 in | Wt 222.9 lb

## 2015-01-11 DIAGNOSIS — R030 Elevated blood-pressure reading, without diagnosis of hypertension: Secondary | ICD-10-CM

## 2015-01-11 DIAGNOSIS — R0989 Other specified symptoms and signs involving the circulatory and respiratory systems: Secondary | ICD-10-CM

## 2015-01-11 MED ORDER — METOPROLOL TARTRATE 25 MG PO TABS
25.0000 mg | ORAL_TABLET | Freq: Two times a day (BID) | ORAL | Status: DC
Start: 2015-01-11 — End: 2015-05-19

## 2015-01-11 NOTE — Progress Notes (Signed)
01/11/2015 Savoy Birchard   07-Jul-1961  [id number]  Primary Physician Caroline More, PA-C Primary Cardiologist: Dr. Allyson Sabal  Reason for Vist/CC: Labile blood pressure  HPI:  Mr. Pulling is a 54 year old male, followed by Dr. Allyson Sabal, who presents to clinic today for evaluation of blood pressure. In addition to hypertension, his cardiovascular history is significant for prior TIA subsequent to carotid artery disease. He recently underwent right carotid endarterectomy performed by Dr. Claudie Fisherman on 09/26/2014. There is also question of remote myocardial infarction in the past. He recently underwent evaluation with a 2-D echocardiogram that was performed preoperatively which showed normal LV function as well as a Myoview stress test that was low risk. His history is also notable for hyperlipidemia and diabetes. He was last seen by Dr. Allyson Sabal 10/26/2014. During that time, he complained of symptomatic hypotension with systolic blood pressures in the low 90s. Subsequently Dr. Allyson Sabal decided to decrease his lisinopril down from 40 mg to 20 mg daily. Since that time he has been seen by our office pharmacist Phillips Hay for follow-up. He has continued to have labile blood pressures. He continued to have periods of continued hypotension as well as periods of hypertension with systolic blood pressures as high as 200. He self discontinued his metoprolol, lisinopril and amlodipine 1 week ago. Since then, he has denied further hypotension. His SBP has ranged from 150s-202. He was seen by another outside provider on 01/09/15 and BP in clinic was 202/101.  Today in clinic, BP is 124/80.    Current Outpatient Prescriptions  Medication Sig Dispense Refill  . aspirin 325 MG tablet Take 325 mg by mouth daily.    . Aspirin-Acetaminophen-Caffeine (EXCEDRIN MIGRAINE PO) Take 2 tablets by mouth as needed (for migraine).    Marland Kitchen atorvastatin (LIPITOR) 40 MG tablet Take 40 mg by mouth at bedtime.    . bethanechol (URECHOLINE) 25  MG tablet Take 25 mg by mouth 2 (two) times daily.     Marland Kitchen dicyclomine (BENTYL) 10 MG capsule Take 10 mg by mouth 2 (two) times daily.    . diphenoxylate-atropine (LOMOTIL) 2.5-0.025 MG per tablet Take 1 tablet by mouth 2 (two) times daily as needed.     . furosemide (LASIX) 80 MG tablet Take 80 mg by mouth daily.    . insulin NPH-regular Human (NOVOLIN 70/30) (70-30) 100 UNIT/ML injection Inject 50 Units into the skin See admin instructions. Take 50 units with breakfast and dinner,  May take 50 units at bedtime as needed    . metFORMIN (GLUCOPHAGE-XR) 500 MG 24 hr tablet Take 500 mg by mouth daily.    Marland Kitchen omeprazole (PRILOSEC) 20 MG capsule Take 20 mg by mouth daily.    . potassium chloride SA (K-DUR,KLOR-CON) 20 MEQ tablet Take 20 mEq by mouth daily.    . tamsulosin (FLOMAX) 0.4 MG CAPS capsule Take 1 capsule (0.4 mg total) by mouth daily. 30 capsule 1  . terbinafine (LAMISIL) 250 MG tablet Take 250 mg by mouth daily.    Marland Kitchen testosterone cypionate (DEPOTESTOTERONE CYPIONATE) 200 MG/ML injection Inject into the muscle every 14 (fourteen) days. As directed    . VITAMIN D, CHOLECALCIFEROL, PO Take 1 tablet by mouth daily.    . metoprolol tartrate (LOPRESSOR) 25 MG tablet Take 1 tablet (25 mg total) by mouth 2 (two) times daily. 180 tablet 3   No current facility-administered medications for this visit.    No Known Allergies  History   Social History  . Marital Status: Married    Spouse  Name: N/A    Number of Children: 5  . Years of Education: 12th   Occupational History  . Disabled    Social History Main Topics  . Smoking status: Never Smoker   . Smokeless tobacco: Never Used  . Alcohol Use: No  . Drug Use: No  . Sexual Activity:    Partners: Female   Other Topics Concern  . Not on file   Social History Narrative   Patient is married with 5 children.    Patient is right handed.   Patient has hs education.   Patient drinks 4 12 oz can sodas daily.     Review of  Systems: General: negative for chills, fever, night sweats or weight changes.  Cardiovascular: negative for chest pain, dyspnea on exertion, edema, orthopnea, palpitations, paroxysmal nocturnal dyspnea or shortness of breath Dermatological: negative for rash Respiratory: negative for cough or wheezing Urologic: negative for hematuria Abdominal: negative for nausea, vomiting, diarrhea, bright red blood per rectum, melena, or hematemesis Neurologic: negative for visual changes, syncope, or dizziness All other systems reviewed and are otherwise negative except as noted above.    Blood pressure 124/80, pulse 62, height  (1.676 m), weight 222 lb 14.4 oz (101.107 kg).  General appearance: alert, cooperative and no distress Neck: no carotid bruit and no JVD Lungs: clear to auscultation bilaterally Heart: regular rate and rhythm, S1, S2 normal, no murmur, click, rub or gallop Extremities: no LRR Pulses: 2+ and symmetric Skin: warm and dry Neurologic: Grossly normal  EKG Not performed  ASSESSMENT AND PLAN:   1. Labile blood pressure: patient has been off his Metroprolol, lisinopril and amlodipine over the last week. Since discontinuation of his medications he denies any further hypotension. He has continued to monitor his blood pressure regularly at home and systolic blood pressures have ranged anywhere between the 150s to 200s. Given his history of carotid artery disease and prior TIA, will need to slowly reintroduce antihypertensives for better blood pressure control. Will start by reintroducing his Metroprolol today. He has been instructed to continue to monitor his blood pressure at home to assess his response. He is instructed to return for follow-up in one to 2 weeks for repeat evaluation. If blood pressure remaines stable and if additional coverage is needed, we will reintroduce lisinopril at a low dose. The patient is in agreement with this plan.    SIMMONS,  BRITTAINYPA-C 01/11/2015 5:41 PM

## 2015-01-11 NOTE — Patient Instructions (Signed)
Your physician recommends that you schedule a follow-up appointment in: 1-2 weeks with Joya Gaskins for a appointment.  START back your Metoprolol 25 mg Twice daily.  Continue to stay off of your Lisinopril and your Norvasc

## 2015-01-16 ENCOUNTER — Ambulatory Visit: Payer: No Typology Code available for payment source

## 2015-01-27 ENCOUNTER — Ambulatory Visit (INDEPENDENT_AMBULATORY_CARE_PROVIDER_SITE_OTHER)
Payer: No Typology Code available for payment source | Admitting: Pharmacist Clinician (PhC)/ Clinical Pharmacy Specialist

## 2015-01-27 VITALS — BP 152/80 | Ht 66.0 in | Wt 229.5 lb

## 2015-01-27 DIAGNOSIS — I10 Essential (primary) hypertension: Secondary | ICD-10-CM

## 2015-01-27 NOTE — Patient Instructions (Addendum)
Return for a a follow up appointment in 2 months  Stop potassium for now  Go to lab in 1 week for repeat of potassium level  Your blood pressure today is 152/82  Check your blood pressure at home several times per week and keep record of the readings.  Take your BP meds as follows: re-start lisinopril at 10 mg (1/4 tablet) daily  Bring all of your meds, your BP cuff and your record of home blood pressures to your next appointment.  Exercise as you're able, try to walk approximately 30 minutes per day.  Keep salt intake to a minimum, especially watch canned and prepared boxed foods.  Eat more fresh fruits and vegetables and fewer canned items.  Avoid eating in fast food restaurants.    HOW TO TAKE YOUR BLOOD PRESSURE: . Rest 5 minutes before taking your blood pressure. .  Don't smoke or drink caffeinated beverages for at least 30 minutes before. . Take your blood pressure before (not after) you eat. . Sit comfortably with your back supported and both feet on the floor (don't cross your legs). . Elevate your arm to heart level on a table or a desk. . Use the proper sized cuff. It should fit smoothly and snugly around your bare upper arm. There should be enough room to slip a fingertip under the cuff. The bottom edge of the cuff should be 1 inch above the crease of the elbow. . Ideally, take 3 measurements at one sitting and record the average.

## 2015-01-29 ENCOUNTER — Encounter: Payer: Self-pay | Admitting: Pharmacist Clinician (PhC)/ Clinical Pharmacy Specialist

## 2015-01-29 NOTE — Assessment & Plan Note (Addendum)
Today his BP is somewhat elevated in the office.  His home readings, though quite labile, are hard to use as comparison, as his cuff was 15-20 points off when compared in the office previously.  Because of his elevated pressure today and his longstanding DM, I have elected to restart his lisinopril as well.  Mindful of the fact that any hypotension will most likely cause him to discontinue medications, I have suggested to him that he take just 10 mg (1/4 of his 40 mg tablet) daily.  We discussed the reno-protective benefits of ACE inhibitors and he seemed willing to restart this low dose.  I will see him back in 2 months for a BP check.    Regarding his elevated potassium, I had him discontinue the potassium tablets for now and he is to go to lab in 1 week for a repeat BMET.  Will follow up accordingly.

## 2015-01-29 NOTE — Progress Notes (Signed)
01/29/2015 Johnathan Keith 28-Jan-1961 [id number]   HPI:  Johnathan Keith is a 54 y.o. male patient of Dr Allyson Sabal, with a PMH below who presents today for a follow up hypertension clinic visit.  He has had difficulty with compliance in his medications over the past year or so because of costs, he does not have good drug coverage.  He has discontinued most of his diabetes medications unless given samples, and uses Novolin insulin that he can by OTC at [facility].  Last A1c was 9, previously 12.3.  Pt has peripheral neuropathy, retinopathy, gastroparesis.  He tends to have very labile blood pressures, with systolic readings ranging from 90-160 on a regular basis. He saw Johnathan Lis PA recently, and prior to that visit had self discontinued his metoprolol, amlodipine and lisinopril.  Since that time he reports no hypotension, although home BP readings were more commonly toward the 150 range.  Johnathan Keith re-started the metoprol  His home cuff was compared to the office reading at a previous visit and found to be somewhat inaccurate, reading 15+ points higher.  He comes in today with labs from another provider, showing a potassium of 5.5.  He says that provider suggested he cut his potassium supplement from 20 mEq qd to qod.  Pt chose to cut to 10 mEq qod.    Cardiac Hx: DM, hyperlipidemia, remote MI and TIA  Social Hx: no tobacco or alcohol.  Drinks 4+ cans diet soda per day, including Dr Johnathan Keith, [employer] and [facility], takes Excedrin Migraine regularly  Current antihypertensive medications:  metoprolol 25 mg bid   Current Outpatient Prescriptions  Medication Sig Dispense Refill  . aspirin 325 MG tablet Take 325 mg by mouth daily.    . Aspirin-Acetaminophen-Caffeine (EXCEDRIN MIGRAINE PO) Take 2 tablets by mouth as needed (for migraine).    Marland Kitchen atorvastatin (LIPITOR) 40 MG tablet Take 40 mg by mouth at bedtime.    . bethanechol (URECHOLINE) 25 MG tablet Take 25 mg by mouth 2 (two) times daily.       Marland Kitchen dicyclomine (BENTYL) 10 MG capsule Take 10 mg by mouth 2 (two) times daily.    . diphenoxylate-atropine (LOMOTIL) 2.5-0.025 MG per tablet Take 1 tablet by mouth 2 (two) times daily as needed.     . furosemide (LASIX) 80 MG tablet Take 80 mg by mouth daily.    . insulin NPH-regular Human (NOVOLIN 70/30) (70-30) 100 UNIT/ML injection Inject 50 Units into the skin See admin instructions. Take 50 units with breakfast and dinner,  May take 50 units at bedtime as needed    . metFORMIN (GLUCOPHAGE-XR) 500 MG 24 hr tablet Take 500 mg by mouth daily.    . metoprolol tartrate (LOPRESSOR) 25 MG tablet Take 1 tablet (25 mg total) by mouth 2 (two) times daily. 180 tablet 3  . omeprazole (PRILOSEC) 20 MG capsule Take 20 mg by mouth daily.    . potassium chloride SA (K-DUR,KLOR-CON) 20 MEQ tablet Take 20 mEq by mouth daily.    . tamsulosin (FLOMAX) 0.4 MG CAPS capsule Take 1 capsule (0.4 mg total) by mouth daily. 30 capsule 1  . terbinafine (LAMISIL) 250 MG tablet Take 250 mg by mouth daily.    Marland Kitchen testosterone cypionate (DEPOTESTOTERONE CYPIONATE) 200 MG/ML injection Inject into the muscle every 14 (fourteen) days. As directed    . VITAMIN D, CHOLECALCIFEROL, PO Take 1 tablet by mouth daily.     No current facility-administered medications for this visit.    No Known  Allergies  Past Medical History  Diagnosis Date  . Hypertension   . Diabetes mellitus without complication   . High cholesterol   . Stroke   . Heart attack   . Diabetic retinopathy   . Peripheral vascular disease     carotid artery disease  . PONV (postoperative nausea and vomiting)     Blood pressure 152/80, height  (1.676 m), weight 229 lb 8 oz (104.101 kg).    Johnathan Keith PharmD CPP [facility]

## 2015-02-07 ENCOUNTER — Encounter: Payer: Self-pay | Admitting: Cardiology

## 2015-02-10 ENCOUNTER — Telehealth: Payer: Self-pay | Admitting: Pharmacist Clinician (PhC)/ Clinical Pharmacy Specialist

## 2015-02-10 MED ORDER — FUROSEMIDE 80 MG PO TABS: 80.0000 mg | ORAL_TABLET | Freq: Every day | ORAL | Status: DC

## 2015-02-10 NOTE — Telephone Encounter (Signed)
Follow-up with mid-level provider to evaluate

## 2015-02-10 NOTE — Telephone Encounter (Signed)
Pt called, wanted to know results of lab work and let us know that he is having swelling in his lower extremities.  Would like to know if he can increase the lasix.  Of note, at his last BP visit, he stated that he had decreased his potassium from 20 mEq qid to 10 mEq qod, because he had been told it was elevated.  I asked him to discontinue potassium and we would check a BMET.  BMET came out with K normal at 4.8.  Will forward to Dr. Allyson Sabal for review

## 2015-02-14 NOTE — Telephone Encounter (Signed)
Spoke with patient and sent information to scheduling.

## 2015-02-15 ENCOUNTER — Telehealth: Payer: Self-pay | Admitting: *Deleted

## 2015-02-15 NOTE — Telephone Encounter (Signed)
Form,[employer] sent to [facility] and Dr Roda Shutters 02-15-15.

## 2015-02-16 NOTE — Telephone Encounter (Signed)
Form completed and returned to medical records

## 2015-02-20 NOTE — Telephone Encounter (Signed)
Form,[employer] received,completed by Dr Roda Shutters and Andreas Blower faxed 02/17/15.

## 2015-03-13 ENCOUNTER — Ambulatory Visit (INDEPENDENT_AMBULATORY_CARE_PROVIDER_SITE_OTHER): Payer: No Typology Code available for payment source | Admitting: Neurology

## 2015-03-13 ENCOUNTER — Encounter: Payer: Self-pay | Admitting: Neurology

## 2015-03-13 VITALS — BP 133/75 | HR 62 | Ht 66.0 in | Wt 225.5 lb

## 2015-03-13 DIAGNOSIS — I6521 Occlusion and stenosis of right carotid artery: Secondary | ICD-10-CM | POA: Diagnosis not present

## 2015-03-13 DIAGNOSIS — E785 Hyperlipidemia, unspecified: Secondary | ICD-10-CM

## 2015-03-13 DIAGNOSIS — I639 Cerebral infarction, unspecified: Secondary | ICD-10-CM | POA: Diagnosis not present

## 2015-03-13 DIAGNOSIS — I10 Essential (primary) hypertension: Secondary | ICD-10-CM | POA: Diagnosis not present

## 2015-03-13 NOTE — Progress Notes (Signed)
STROKE NEUROLOGY FOLLOW UP NOTE  NAME: Johnathan Keith DOB: 1961/04/05  REASON FOR VISIT: stroke follow up HISTORY FROM: pt and chart  Today we had the pleasure of seeing Johnathan Keith in follow-up at our Neurology Clinic. Pt was accompanied by wife.   History Summary Johnathan Keith presents today as clinic follow up of stroke and right carotid stenosis > 70% in 2014. He had left hemiparesis in 2014 but CT and MRI did not show acute stroke, however, by my read, it is questionable a small DWI change at right pons without corresponding ADC attenuation. He has multiple stroke risk factors including HTN, DM, HLD, and right carotid stenosis.  He is on ASA and lipitor now for stroke prevention.  08/04/14 follow up - During the interval time, the patient has been doing fine without changes. He has seen his PCP and made changes for his DM regimen since his A1c was very high.    His right carotid stenosis > 70% a year ago without intervention, at that time his ICA/CCA ratio was 3.19, as compared to his left ICA/CCA ratio 1.02.  During the interval, we repeated CUS which showed right proximal/mid ICA >70% stenosis, the right ICA/CCA ratio 7.06 and left ICA/CCA ratio 0.95. The right ICA stenosis progressed from one year ago. He also complains that he has finger and toes tingling numbness as well as distal extremities cold with purple color from time to time.   11/09/14 follow up - he has followed up with Johnathan Keith and had right CEA done on 09/26/14. Since then, he has intermittent chocking on eating solid food or drinking liquid. He is going to see GI next week. He also followed up with cardiology for low BP and his lisinopril decreased from 40mg  daily to 20mg  daily. He stated that his latest A1C check was 9.9 down from previous 12.3. His BP was well controlled, today in clinic 121/72.  He stated that he has migraine hx and could happen once a week, if not taking medications it may last 2 days. But if takes  Excedrin early in the course, it may last only 1hr after lying down and sleep. He continued to complains of tingling bilateral fingertips and toes. Denies smoking.    Interval History During the interval time, he was doing well. He stated that he can not stand too long otherwise he will have LBP, leg pain and neck pain and he has to lie down. He still has leg numbness and tingling. His sugar not in good control and around 200s due to insurance not covering new generation of DM meds which keeps his glucose at 150s. Now he has to go back to metformin and insulin. His BP better controlled, today 133/75. His right neck wound healed well and he is going to see Johnathan Keith in 3 weeks and will do CUS again there. His swallow much better and only occasional chock with certain food.    REVIEW OF SYSTEMS: Full 14 system review of systems performed and notable only for those listed below and in HPI above, all others are negative:  Constitutional: appetite change  Cardiovascular: leg swelling  Ear/Nose/Throat: facial swelling, runny nose, trouble swallowing Skin: N/A  Eyes: eye discharge, eye itching, light sensitivity, blurry vision  Respiratory: cough, SOB, choking  Gastroitestinal: diarrhea, nausea  Genitourinary: N/A Hematology/Lymphatic: N/A  Endocrine: N/A  Musculoskeletal: joint pain, back pain, walking difficulty, neck pain  Allergy/Immunology: N/A  Neurological: weakness,dizziness, HA, numbness Psychiatric: frequent waking, apnea, daytime sleepiness  The following represents the patient's updated allergies and side effects list: No Known Allergies  The neurologically relevant items on the patient's problem list were reviewed on today's visit.  Neurologic Examination  A problem focused neurological exam (12 or more points of the single system neurologic examination, vital signs counts as 1 point, cranial nerves count for 8 points) was performed.  Blood pressure 133/75, pulse 62, height   (1.676 m), weight 225 lb 8 oz (102.286 kg).  General - Well nourished, well developed, in no apparent distress.  Ophthalmologic - Sharp disc margins OU.  Cardiovascular - Regular rate and rhythm with no murmur. Carotid no bruits.  Neck - supple, no nuchal rigidity.  Mental Status -  Level of arousal and orientation to time, place, and person were intact.  Language including expression, naming, repetition, comprehension was assessed and found intact.  Attention span and concentration were normal.  Recent and remote memory were intact.  Fund of Knowledge was assessed and was intact.  Cranial Nerves II - XII -  II - Vision intact OU.  III, IV, VI - Extraocular movements intact.  V - Facial sensation decreased on the left.  VII - Facial movement intact bilaterally.  VIII - Hearing & vestibular intact bilaterally.  X - Palate elevates symmetrically.  XI - Chin turning & shoulder shrug intact bilaterally.  XII - Tongue protrusion intact.  Motor Strength - The patient's strength was normal in all extremities and pronator drift was absent. Bulk was normal and fasciculations were absent.  Motor Tone - Muscle tone was assessed at the neck and appendages and was normal.  Reflexes - The patient's reflexes were normal in all extremities and he had no pathological reflexes.  Sensory - Light touch, temperature/pinprick were assessed and were decreased on left UE.  Coordination - The patient had normal movements in the hands and feet with no ataxia or dysmetria. Tremor was absent.  Gait and Station - very mild left hemiparetic gait (left leg dragging), broad based gait.  Images and labs:  I have personally reviewed the radiological images below and agree with the radiology interpretations.  CTA neck - Atherosclerotic disease in the right carotid bifurcation. 15 mm above the bifurcation, focal severe stenosis measuring 75% diameter stenosis Mild atherosclerotic disease left carotid bifurcation  without significant stenosis Both vertebral arteries widely patent.  CUS - 80-99% right ICA stenosis.  2D echo - - Left ventricle: The cavity size was normal. Wall thickness was increased in a pattern of mild LVH. Systolic function was normal. The estimated ejection fraction was in the range of 55% to 60%. Wall motion was normal; there were no regional wall motion abnormalities. Doppler parameters are consistent with abnormal left ventricular relaxation (grade 1 diastolic dysfunction). The E/e&' ratio is >15, suggesting elevated LV filling pressure. - Aortic valve: Sclerosis without stenosis. There was trace to mild regurgitation. - Left atrium: LA volume/ BSA = 20.7 ml/m2. The atrium was normal in size. - Tricuspid valve: There was trivial regurgitation. - Pulmonary arteries: PA peak pressure: 24 mm Hg (S). Impressions: - LVEF 55-60%, mild LVH, normal wall motion, diastolic dysfunction, elevated LV filling pressure, normal LA size, aortic valve sclerosis with trace to mild AI.  Component     Latest Ref Rng 07/27/2014  Cholesterol, Total     100 - 199 mg/dL 191  Triglycerides     0 - 149 mg/dL 478 (H)  HDL     >29 mg/dL 30 (L)  VLDL Cholesterol Cal  5 - 40 mg/dL 42 (H)  LDL (calc)     0 - 99 mg/dL 84  Total CHOL/HDL Ratio     0.0 - 5.0 ratio units 5.2 (H)  TSH     0.450 - 4.500 uIU/mL 2.860  Free T4     0.82 - 1.77 ng/dL 4.09  Hgb W1X MFr Bld     4.8 - 5.6 % 12.3 (H)  Est. average glucose Bld gHb Est-mCnc      306    Assessment: As you may recall, he is a 54 y.o. Caucasian male with PMH of HTN, HLD, DM and right ICA stenosis followed up in clinic today for stroke and right ICA stenosis. He had left hemiparesis in 2014 but CT and MRI did not show acute stroke, however, by my read, it is questionable a small DWI change at right pons without corresponding ADC attenuation. Regardless, he has multiple stroke risk factors including HTN, DM, HLD, and  right carotid stenosis. He is on ASA and lipitor now for stroke prevention. He also has right carotid stenosis and had right CEA by Johnathan Keith. He also has intermittent chocking since the surgery but now much improved.   Plan:  - continue ASA and lipitor for stroke prevention - follow up with Johnathan Keith and Dr. Allyson Keith - Follow up with your primary care physician for stroke risk factor modification. Recommend maintain blood pressure goal <130/80, diabetes with hemoglobin A1c goal below 6.5% and lipids with LDL cholesterol goal below 70 mg/dL. \ - continue to monitor BP and glucose level at home closely - RTC in 6 months.  No orders of the defined types were placed in this encounter.    Patient Instructions  - continue ASA and lipitor for stroke prevention - check BP and glucose at home - your glucose needs better control. - Follow up with your primary care physician for stroke risk factor modification. Recommend maintain blood pressure goal <130/80, diabetes with hemoglobin A1c goal below 6.5% and lipids with LDL cholesterol goal below 70 mg/dL.  - follow up with Johnathan Keith and Dr. Gery Keith as scheduled - increase physical exercise as long as you are comfortable.  - follow up in 6 months    Marvel Plan, MD PhD [facility] [address], [address] [place], [place] [address] [phone]

## 2015-03-13 NOTE — Patient Instructions (Addendum)
-   continue ASA and lipitor for stroke prevention - check BP and glucose at home - your glucose needs better control. - Follow up with your primary care physician for stroke risk factor modification. Recommend maintain blood pressure goal <130/80, diabetes with hemoglobin A1c goal below 6.5% and lipids with LDL cholesterol goal below 70 mg/dL.  - follow up with Dr. [name] and Dr. Gery Pray as scheduled - increase physical exercise as long as you are comfortable.  - follow up in 6 months

## 2015-03-16 ENCOUNTER — Telehealth: Payer: Self-pay | Admitting: *Deleted

## 2015-03-16 NOTE — Telephone Encounter (Signed)
tammie smith from [employer] was calling about Johnathan Keith restrictions on the last visit made and I stated that patient came in on 10/17/14 and just had his toenails trimmed and that we do not give restrictions for toenail trim and I faxed a copy of the chart note. [phone]. Misty Stanley

## 2015-03-22 ENCOUNTER — Ambulatory Visit (INDEPENDENT_AMBULATORY_CARE_PROVIDER_SITE_OTHER): Payer: No Typology Code available for payment source | Admitting: Cardiology

## 2015-03-22 ENCOUNTER — Other Ambulatory Visit: Payer: Self-pay | Admitting: *Deleted

## 2015-03-22 ENCOUNTER — Encounter: Payer: Self-pay | Admitting: Cardiology

## 2015-03-22 VITALS — BP 126/80 | HR 69 | Ht 66.0 in | Wt 224.3 lb

## 2015-03-22 DIAGNOSIS — R5382 Chronic fatigue, unspecified: Secondary | ICD-10-CM

## 2015-03-22 DIAGNOSIS — E785 Hyperlipidemia, unspecified: Secondary | ICD-10-CM | POA: Diagnosis not present

## 2015-03-22 DIAGNOSIS — T148 Other injury of unspecified body region: Secondary | ICD-10-CM

## 2015-03-22 DIAGNOSIS — R0600 Dyspnea, unspecified: Secondary | ICD-10-CM

## 2015-03-22 DIAGNOSIS — Z79899 Other long term (current) drug therapy: Secondary | ICD-10-CM

## 2015-03-22 DIAGNOSIS — I2583 Coronary atherosclerosis due to lipid rich plaque: Principal | ICD-10-CM

## 2015-03-22 DIAGNOSIS — I10 Essential (primary) hypertension: Secondary | ICD-10-CM

## 2015-03-22 DIAGNOSIS — I2510 Atherosclerotic heart disease of native coronary artery without angina pectoris: Secondary | ICD-10-CM | POA: Diagnosis not present

## 2015-03-22 DIAGNOSIS — R931 Abnormal findings on diagnostic imaging of heart and coronary circulation: Secondary | ICD-10-CM

## 2015-03-22 DIAGNOSIS — Z01812 Encounter for preprocedural laboratory examination: Secondary | ICD-10-CM

## 2015-03-22 DIAGNOSIS — T148XXA Other injury of unspecified body region, initial encounter: Secondary | ICD-10-CM

## 2015-03-22 LAB — COMPREHENSIVE METABOLIC PANEL
ALT: 22 U/L (ref 0–53)
AST: 17 U/L (ref 0–37)
Albumin: 4.2 g/dL (ref 3.5–5.2)
Alkaline Phosphatase: 47 U/L (ref 39–117)
BUN: 22 mg/dL (ref 6–23)
CO2: 28 mEq/L (ref 19–32)
Calcium: 10.1 mg/dL (ref 8.4–10.5)
Chloride: 103 mEq/L (ref 96–112)
Creat: 0.81 mg/dL (ref 0.50–1.35)
Glucose, Bld: 135 mg/dL — ABNORMAL HIGH (ref 70–99)
Potassium: 4.5 mEq/L (ref 3.5–5.3)
Sodium: 144 mEq/L (ref 135–145)
Total Bilirubin: 0.4 mg/dL (ref 0.2–1.2)
Total Protein: 7.2 g/dL (ref 6.0–8.3)

## 2015-03-22 LAB — CBC
HCT: 42.9 % (ref 39.0–52.0)
Hemoglobin: 14.4 g/dL (ref 13.0–17.0)
MCH: 28.6 pg (ref 26.0–34.0)
MCHC: 33.6 g/dL (ref 30.0–36.0)
MCV: 85.3 fL (ref 78.0–100.0)
MPV: 10.9 fL (ref 8.6–12.4)
Platelets: 283 10*3/uL (ref 150–400)
RBC: 5.03 MIL/uL (ref 4.22–5.81)
RDW: 15.8 % — ABNORMAL HIGH (ref 11.5–15.5)
WBC: 11.6 10*3/uL — ABNORMAL HIGH (ref 4.0–10.5)

## 2015-03-22 LAB — LIPID PANEL
Cholesterol: 132 mg/dL (ref 0–200)
HDL: 34 mg/dL — ABNORMAL LOW (ref >=40)
LDL Cholesterol: 54 mg/dL (ref 0–99)
Total CHOL/HDL Ratio: 3.9 Ratio
Triglycerides: 222 mg/dL — ABNORMAL HIGH (ref <150)
VLDL: 44 mg/dL — ABNORMAL HIGH (ref 0–40)

## 2015-03-22 MED ORDER — LISINOPRIL 20 MG PO TABS: 10.0000 mg | ORAL_TABLET | Freq: Every day | ORAL | Status: DC

## 2015-03-22 NOTE — Progress Notes (Signed)
 Cardiology Office Note   Date:  03/22/2015   ID:  Johnathan Keith, DOB 10/07/1961, MRN [id number]  PCP:  WILLIFORD,ROBERT, MD  Cardiologist:  Dr. Berry    Chief Complaint  Patient presents with  . Leg Swelling    PATIENT COMPLAIN OF SWELLING.      History of Present Illness: Johnathan Keith is a 53 y.o. male who presents for swelling of legs, he had called in and was scheduled an appt.  Recent labs in 2/16 with K+ 4.8-  kdur stopped,  Cr. 0.89, Na 148 and uric acid 8.0  Currently on Lasix 80 mg daily.   In addition to hypertension, his cardiovascular history is significant for prior TIA subsequent to carotid artery disease. He recently underwent right carotid endarterectomy performed by Dr. Chin on 09/26/2014. There is also question of remote myocardial infarction in the past. He recently underwent evaluation with a 2-D echocardiogram that was performed preoperatively which showed normal LV function as well as a Myoview stress test that had subtle inferior and lateral ischemia.  At that time he had no SOB or chest pain and was cleared for surgery. His history is also notable for hyperlipidemia and diabetes. He was last seen by Dr. Berry 10/26/2014. During that time, he complained of symptomatic hypotension with systolic blood pressures in the low 90s. Subsequently Dr. Berry decided to decrease his lisinopril down from 40 mg to 20 mg daily and now down to 10 mg daily.  He is taking a forth of 40 mg table.   Since that time he has been seen by our office pharmacist [name] Alvstad for labile blood pressures. Though over all BP is improved.  He is on most of his meds.    He has been on lasix 80 mg daily for a year for lower ext. Edema.  His K+ has been elevated at times and currently he is off the kdur.  He has not had recent lipids.  In addition to lower ext edema he has DOE.  No chest pain but walking in grocery store and to car he is very SOB.  This has progressed since Nuc study.  He has  several risk factors:  DM, hyperlipidemia, vascular disease and family hx CAD.  He also has wound on lt gt toe, an ingrown toenail.  He will follow with his PCP but with vascular disease will check lower ext arterial dopplers.        Past Medical History  Diagnosis Date  . Hypertension   . Diabetes mellitus without complication   . High cholesterol   . Stroke   . Heart attack   . Diabetic retinopathy   . Peripheral vascular disease     carotid artery disease  . PONV (postoperative nausea and vomiting)     Past Surgical History  Procedure Laterality Date  . Hydrocele excision    . Refractive surgery Right 09-07-14    Diabetic retinopathy   . Circumcision    . Endarterectomy Right 09/26/2014    Procedure: RIGHT CAROTID ENDARTERECTOMY WITH PATCH ANGIOPLASTY;  Surgeon: Brian L Chen, MD;  Location: [facility] OR;  Service: Vascular;  Laterality: Right;     Current Outpatient Prescriptions  Medication Sig Dispense Refill  . aspirin 325 MG tablet Take 325 mg by mouth daily.    . Aspirin-Acetaminophen-Caffeine (EXCEDRIN MIGRAINE PO) Take 2 tablets by mouth as needed (for migraine).    . atorvastatin (LIPITOR) 40 MG tablet Take 40 mg by mouth at bedtime.    .   bethanechol (URECHOLINE) 25 MG tablet Take 25 mg by mouth 2 (two) times daily.     . dicyclomine (BENTYL) 10 MG capsule Take 10 mg by mouth 2 (two) times daily.    . diphenoxylate-atropine (LOMOTIL) 2.5-0.025 MG per tablet Take 1 tablet by mouth 2 (two) times daily as needed.     . furosemide (LASIX) 80 MG tablet Take 1 tablet (80 mg total) by mouth daily. 90 tablet 0  . insulin NPH-regular Human (NOVOLIN 70/30) (70-30) 100 UNIT/ML injection Inject 50 Units into the skin See admin instructions. Take 50 units with breakfast and dinner,  May take 50 units at bedtime as needed    . metFORMIN (GLUCOPHAGE-XR) 500 MG 24 hr tablet Take 500 mg by mouth daily.    . metoprolol tartrate (LOPRESSOR) 25 MG tablet Take 1 tablet (25 mg total) by mouth  2 (two) times daily. 180 tablet 3  . omeprazole (PRILOSEC) 20 MG capsule Take 20 mg by mouth daily. Prn    . tamsulosin (FLOMAX) 0.4 MG CAPS capsule Take 1 capsule (0.4 mg total) by mouth daily. 30 capsule 1  . terbinafine (LAMISIL) 250 MG tablet Take 250 mg by mouth daily.    . VITAMIN D, CHOLECALCIFEROL, PO Take 1 tablet by mouth daily.     No current facility-administered medications for this visit.    Allergies:   Review of patient's allergies indicates no known allergies.    Social History:  The patient  reports that he has never smoked. He has never used smokeless tobacco. He reports that he does not drink alcohol or use illicit drugs.   Family History:  The patient's family history includes Asthma in his brother; Cancer in his father; Colon cancer in his sister; Diabetes in his brother, maternal grandfather, and mother; Gout in his brother and maternal uncle; Heart disease in his father and mother; Hyperlipidemia in his mother; Hypertension in his mother.    ROS:  General:no colds or fevers, no weight changes Skin:no rashes + ulcers on lt great toe HEENT:no blurred vision, no congestion CV:see HPI PUL:see HPI GI:occ diarrhea and constipation followed by GI, no melena, no indigestion GU:no hematuria, no dysuria MS:no joint pain, no claudication Neuro:no syncope, no lightheadedness, + migraines Endo:+ diabetes stated it was not as well controlled., no thyroid disease  Wt Readings from Last 3 Encounters:  03/22/15 224 lb 4.8 oz (101.742 kg)  03/13/15 225 lb 8 oz (102.286 kg)  01/29/15 229 lb 8 oz (104.101 kg)     PHYSICAL EXAM: VS:  BP 126/80 mmHg  Pulse 69  Ht 5' 6" (1.676 m)  Wt 224 lb 4.8 oz (101.742 kg)  BMI 36.22 kg/m2 , BMI Body mass index is 36.22 kg/(m^2). General:Pleasant affect, NAD Skin:Warm and dry, brisk capillary refill HEENT:normocephalic, sclera clear, mucus membranes moist Neck:supple, no JVD, no bruits, no adenopathy  Heart:S1S2 RRR without murmur,  gallup, rub or click Lungs:clear without rales, rhonchi, or wheezes Abd:soft, non tender, + BS, do not palpate liver spleen or masses Ext: lt lower ext edema 1+, tr on rt, 1+ pedal pulses, 2+ radial pulses Neuro:alert and oriented X 3, MAE, follows commands, + facial symmetry    EKG:  EKG is ordered today. The ekg ordered today demonstrates SR rate of 69 no changes from 09/2014.    Recent Labs: 07/27/2014: TSH 2.860 09/22/2014: ALT 29 09/27/2014: BUN 17; Creatinine 0.69; Hemoglobin 12.4*; Platelets 221; Potassium 4.0; Sodium 139    Lipid Panel    Component Value Date/Time     CHOL 156 07/27/2014 0843   TRIG 210* 07/27/2014 0843   HDL 30* 07/27/2014 0843   CHOLHDL 5.2* 07/27/2014 0843   LDLCALC 84 07/27/2014 0843       Other studies Reviewed: Additional studies/ records that were reviewed today include: previous echo and nuc study, notes..   ASSESSMENT AND PLAN:  1.  DOE, increasing- with low risk nuc study 09/2014- however it did show mild anteroapical and inferobasal hypoperfusion. While this did not meet the level of significance that would have necessitated invasive investigation prior to his surgery it does raise the issue that he may have subclinical myocardial ischemia.  With increasing dyspnea this may be anginal equivalent.  Discussed with Dr. Berry and we both discussed with pt.  It would be prudent to proceed with cardiac cath with increasing DOE and his multiple risk factors.  Pt is agreeable.  Pt follow up wil be determined at time of cath.  The patient understands that risks included but are not limited to stroke (1 in 1000), death (1 in 1000), kidney failure [usually temporary] (1 in 500), bleeding (1 in 200), allergic reaction [possibly serious] (1 in 200).  He did relate with surgery he had confusion with narcotics   2.  Carotid artery stenosis with cerebral infarction 2015   Status post successful uncomplicated right carotid endarterectomy performed by Dr. Brian  Chen 09/26/14.  3.  HTN (hypertension) Blood pressure today is stable, on his list it has been fairly stable with occ elevation..   4.  HLD (hyperlipidemia) History of hyperlipidemia on atorvastatin 40 mg. His recent lipid profile performed 07/27/14 revealed an LDL of 84 and HDL of 30. He does admit to dietary indiscretion and is close to being at goal for secondary prevention. Talked about dietary modification. Will recheck lipids prior to procedure  5.  DM 2 not as well controlled- followed by PCP  6. Non healing wound- ingrown toe nail.  Will check lower ext art. dopplers   Current medicines are reviewed with the patient today.  The patient Has no concerns regarding medicines.  The following changes have been made:  See above Labs/ tests ordered today include:see above  Disposition:   FU:  see above  Signed, Georgia Delsignore R, NP  03/22/2015 11:11 AM    [facility] [address], [place], [place]  [address]/ [address] [place], [place] Phone: [phone]; Fax: [phone]  [phone]   

## 2015-03-22 NOTE — Patient Instructions (Addendum)
Your physician has requested that you have a cardiac catheterization tomorrow Right Radial. Cardiac catheterization is used to diagnose and/or treat various heart conditions. Doctors may recommend this procedure for a number of different reasons. The most common reason is to evaluate chest pain. Chest pain can be a symptom of coronary artery disease (CAD), and cardiac catheterization can show whether plaque is narrowing or blocking your heart's arteries. This procedure is also used to evaluate the valves, as well as measure the blood flow and oxygen levels in different parts of your heart. For further information please visit [email or web address] Please follow instruction sheet, as given.    A new Rx has been sent to your pharmacy for Lisinopril 20mg  tablets - take 1/2 tablet daily.  Your physician recommends that you return for lab work in: Today at [facility] lab.  Your physician has requested that you have a lower extremity arterial exercise duplex. During this test, exercise and ultrasound are used to evaluate arterial blood flow in the legs. Allow one hour for this exam. There are no restrictions or special instructions.   Your physician recommends that you schedule a follow-up appointment in: 4-6 weeks with Dr. Allyson Sabal.

## 2015-03-23 ENCOUNTER — Encounter (HOSPITAL_COMMUNITY): Payer: Self-pay | Admitting: General Practice

## 2015-03-23 ENCOUNTER — Encounter (HOSPITAL_COMMUNITY)
Admission: RE | Disposition: A | Discharge status: Home / Self Care | Payer: No Typology Code available for payment source | Source: Ambulatory Visit | Attending: Cardiovascular Disease

## 2015-03-23 ENCOUNTER — Ambulatory Visit (HOSPITAL_COMMUNITY)
Admission: RE | Admit: 2015-03-23 | Discharge: 2015-03-24 | Disposition: A | Discharge status: Home / Self Care | Payer: No Typology Code available for payment source | Source: Ambulatory Visit | Attending: Cardiovascular Disease | Admitting: Cardiovascular Disease

## 2015-03-23 DIAGNOSIS — R0600 Dyspnea, unspecified: Secondary | ICD-10-CM | POA: Diagnosis present

## 2015-03-23 DIAGNOSIS — I63311 Cerebral infarction due to thrombosis of right middle cerebral artery: Secondary | ICD-10-CM | POA: Diagnosis present

## 2015-03-23 DIAGNOSIS — E11319 Type 2 diabetes mellitus with unspecified diabetic retinopathy without macular edema: Secondary | ICD-10-CM | POA: Diagnosis not present

## 2015-03-23 DIAGNOSIS — I2510 Atherosclerotic heart disease of native coronary artery without angina pectoris: Secondary | ICD-10-CM | POA: Diagnosis not present

## 2015-03-23 DIAGNOSIS — Z8673 Personal history of transient ischemic attack (TIA), and cerebral infarction without residual deficits: Secondary | ICD-10-CM | POA: Insufficient documentation

## 2015-03-23 DIAGNOSIS — E780 Pure hypercholesterolemia: Secondary | ICD-10-CM | POA: Diagnosis not present

## 2015-03-23 DIAGNOSIS — Z7982 Long term (current) use of aspirin: Secondary | ICD-10-CM | POA: Insufficient documentation

## 2015-03-23 DIAGNOSIS — Z794 Long term (current) use of insulin: Secondary | ICD-10-CM | POA: Diagnosis not present

## 2015-03-23 DIAGNOSIS — E785 Hyperlipidemia, unspecified: Secondary | ICD-10-CM | POA: Diagnosis present

## 2015-03-23 DIAGNOSIS — I10 Essential (primary) hypertension: Secondary | ICD-10-CM | POA: Diagnosis not present

## 2015-03-23 DIAGNOSIS — Z955 Presence of coronary angioplasty implant and graft: Secondary | ICD-10-CM

## 2015-03-23 DIAGNOSIS — I6529 Occlusion and stenosis of unspecified carotid artery: Secondary | ICD-10-CM | POA: Diagnosis present

## 2015-03-23 DIAGNOSIS — I252 Old myocardial infarction: Secondary | ICD-10-CM | POA: Insufficient documentation

## 2015-03-23 DIAGNOSIS — E1140 Type 2 diabetes mellitus with diabetic neuropathy, unspecified: Secondary | ICD-10-CM | POA: Diagnosis present

## 2015-03-23 DIAGNOSIS — E1159 Type 2 diabetes mellitus with other circulatory complications: Secondary | ICD-10-CM | POA: Diagnosis present

## 2015-03-23 DIAGNOSIS — Z79899 Other long term (current) drug therapy: Secondary | ICD-10-CM | POA: Diagnosis not present

## 2015-03-23 DIAGNOSIS — R931 Abnormal findings on diagnostic imaging of heart and coronary circulation: Secondary | ICD-10-CM | POA: Diagnosis present

## 2015-03-23 DIAGNOSIS — R0609 Other forms of dyspnea: Secondary | ICD-10-CM | POA: Diagnosis present

## 2015-03-23 HISTORY — PX: CORONARY ANGIOPLASTY WITH STENT PLACEMENT: SHX49

## 2015-03-23 HISTORY — DX: Sleep apnea, unspecified: G47.30

## 2015-03-23 HISTORY — DX: Headache, unspecified: R51.9

## 2015-03-23 HISTORY — DX: Deviated nasal septum: J34.2

## 2015-03-23 HISTORY — PX: LEFT HEART CATHETERIZATION WITH CORONARY ANGIOGRAM: SHX5451

## 2015-03-23 HISTORY — DX: Dorsalgia, unspecified: M54.9

## 2015-03-23 HISTORY — DX: Migraine, unspecified, not intractable, without status migrainosus: G43.909

## 2015-03-23 HISTORY — DX: Headache: R51

## 2015-03-23 HISTORY — DX: Gastro-esophageal reflux disease without esophagitis: K21.9

## 2015-03-23 HISTORY — DX: Atherosclerotic heart disease of native coronary artery without angina pectoris: I25.10

## 2015-03-23 HISTORY — DX: Other chronic pain: G89.29

## 2015-03-23 HISTORY — DX: Personal history of other diseases of the musculoskeletal system and connective tissue: Z87.39

## 2015-03-23 HISTORY — DX: Cervicalgia: M54.2

## 2015-03-23 HISTORY — DX: Type 2 diabetes mellitus without complications: E11.9

## 2015-03-23 HISTORY — DX: Calculus of kidney: N20.0

## 2015-03-23 LAB — GLUCOSE, CAPILLARY
Glucose-Capillary: 225 mg/dL — ABNORMAL HIGH (ref 70–99)
Glucose-Capillary: 289 mg/dL — ABNORMAL HIGH (ref 70–99)
Glucose-Capillary: 295 mg/dL — ABNORMAL HIGH (ref 70–99)

## 2015-03-23 LAB — POCT ACTIVATED CLOTTING TIME: Activated Clotting Time: 534 seconds

## 2015-03-23 LAB — PROTIME-INR
INR: 1 (ref <1.50)
Prothrombin Time: 13.2 seconds (ref 11.6–15.2)

## 2015-03-23 LAB — APTT: aPTT: 34 seconds (ref 24–37)

## 2015-03-23 LAB — MRSA PCR SCREENING: MRSA by PCR: POSITIVE — AB

## 2015-03-23 SURGERY — LEFT HEART CATHETERIZATION WITH CORONARY ANGIOGRAM

## 2015-03-23 MED ORDER — LIDOCAINE HCL (PF) 1 % IJ SOLN
INTRAMUSCULAR | Status: AC
  Filled 2015-03-23: qty 30

## 2015-03-23 MED ORDER — MUPIROCIN 2 % EX OINT
1.0000 "application " | TOPICAL_OINTMENT | Freq: Two times a day (BID) | CUTANEOUS | Status: DC
  Administered 2015-03-23 – 2015-03-24 (×2): 1 via NASAL
  Filled 2015-03-23: qty 22

## 2015-03-23 MED ORDER — FUROSEMIDE 80 MG PO TABS
80.0000 mg | ORAL_TABLET | Freq: Every day | ORAL | Status: DC
  Administered 2015-03-23 – 2015-03-24 (×2): 80 mg via ORAL
  Filled 2015-03-23 (×2): qty 1

## 2015-03-23 MED ORDER — BETHANECHOL CHLORIDE 25 MG PO TABS
25.0000 mg | ORAL_TABLET | Freq: Two times a day (BID) | ORAL | Status: DC
  Administered 2015-03-23 – 2015-03-24 (×3): 25 mg via ORAL
  Filled 2015-03-23 (×4): qty 1

## 2015-03-23 MED ORDER — ADENOSINE 12 MG/4ML IV SOLN
16.0000 mL | Freq: Once | INTRAVENOUS | Status: DC
  Filled 2015-03-23: qty 16

## 2015-03-23 MED ORDER — INSULIN ASPART 100 UNIT/ML ~~LOC~~ SOLN
0.0000 [IU] | Freq: Three times a day (TID) | SUBCUTANEOUS | Status: DC
  Administered 2015-03-23: 5 [IU] via SUBCUTANEOUS

## 2015-03-23 MED ORDER — ASPIRIN 81 MG PO CHEW
81.0000 mg | CHEWABLE_TABLET | Freq: Once | ORAL | Status: AC
  Administered 2015-03-23: 81 mg via ORAL

## 2015-03-23 MED ORDER — DICYCLOMINE HCL 10 MG PO CAPS
10.0000 mg | ORAL_CAPSULE | Freq: Three times a day (TID) | ORAL | Status: DC
  Administered 2015-03-23 – 2015-03-24 (×2): 10 mg via ORAL
  Filled 2015-03-23 (×5): qty 1

## 2015-03-23 MED ORDER — VERAPAMIL HCL 2.5 MG/ML IV SOLN
INTRAVENOUS | Status: AC
  Filled 2015-03-23: qty 2

## 2015-03-23 MED ORDER — SODIUM CHLORIDE 0.9 % IV SOLN
1.0000 mg/kg/h | INTRAVENOUS | Status: AC
  Administered 2015-03-23: 15:00:00 1 mg/kg/h via INTRAVENOUS
  Filled 2015-03-23 (×4): qty 250

## 2015-03-23 MED ORDER — CLOPIDOGREL BISULFATE 75 MG PO TABS
75.0000 mg | ORAL_TABLET | Freq: Every day | ORAL | Status: DC
  Administered 2015-03-24: 75 mg via ORAL
  Filled 2015-03-23: qty 1

## 2015-03-23 MED ORDER — NITROGLYCERIN 1 MG/10 ML FOR IR/CATH LAB
INTRA_ARTERIAL | Status: AC
  Filled 2015-03-23: qty 10

## 2015-03-23 MED ORDER — MORPHINE SULFATE 2 MG/ML IJ SOLN: 2.0000 mg | INTRAMUSCULAR | Status: DC | PRN

## 2015-03-23 MED ORDER — ACETAMINOPHEN 325 MG PO TABS
650.0000 mg | ORAL_TABLET | ORAL | Status: DC | PRN
Start: 2015-03-23 — End: 2015-03-24

## 2015-03-23 MED ORDER — HEPARIN SODIUM (PORCINE) 1000 UNIT/ML IJ SOLN
INTRAMUSCULAR | Status: AC
Start: 2015-03-23 — End: 2015-03-23
  Filled 2015-03-23: qty 1

## 2015-03-23 MED ORDER — METOPROLOL TARTRATE 25 MG PO TABS
25.0000 mg | ORAL_TABLET | Freq: Two times a day (BID) | ORAL | Status: DC
  Administered 2015-03-23 – 2015-03-24 (×3): 25 mg via ORAL
  Filled 2015-03-23 (×3): qty 1

## 2015-03-23 MED ORDER — INSULIN ASPART 100 UNIT/ML ~~LOC~~ SOLN
0.0000 [IU] | Freq: Three times a day (TID) | SUBCUTANEOUS | Status: DC
Start: 2015-03-23 — End: 2015-03-24
  Administered 2015-03-23: 5 [IU] via SUBCUTANEOUS
  Administered 2015-03-24: 3 [IU] via SUBCUTANEOUS

## 2015-03-23 MED ORDER — SODIUM CHLORIDE 0.9 % IJ SOLN: 3.0000 mL | INTRAMUSCULAR | Status: DC | PRN

## 2015-03-23 MED ORDER — BIVALIRUDIN 250 MG IV SOLR
INTRAVENOUS | Status: AC
Start: 2015-03-23 — End: 2015-03-23
  Filled 2015-03-23: qty 250

## 2015-03-23 MED ORDER — DIPHENOXYLATE-ATROPINE 2.5-0.025 MG PO TABS
1.0000 | ORAL_TABLET | Freq: Three times a day (TID) | ORAL | Status: DC
  Administered 2015-03-23 – 2015-03-24 (×3): 1 via ORAL
  Filled 2015-03-23 (×3): qty 1

## 2015-03-23 MED ORDER — ASPIRIN 81 MG PO CHEW
81.0000 mg | CHEWABLE_TABLET | Freq: Every day | ORAL | Status: DC
  Administered 2015-03-24: 81 mg via ORAL
  Filled 2015-03-23: qty 1

## 2015-03-23 MED ORDER — SODIUM CHLORIDE 0.9 % IV SOLN
INTRAVENOUS | Status: DC
  Administered 2015-03-23: 10:00:00 via INTRAVENOUS

## 2015-03-23 MED ORDER — ONDANSETRON HCL 4 MG/2ML IJ SOLN: 4.0000 mg | Freq: Four times a day (QID) | INTRAMUSCULAR | Status: DC | PRN

## 2015-03-23 MED ORDER — PANTOPRAZOLE SODIUM 40 MG PO TBEC
40.0000 mg | DELAYED_RELEASE_TABLET | Freq: Every day | ORAL | Status: DC | PRN
  Administered 2015-03-23: 40 mg via ORAL

## 2015-03-23 MED ORDER — CLOPIDOGREL BISULFATE 300 MG PO TABS
ORAL_TABLET | ORAL | Status: AC
  Filled 2015-03-23: qty 2

## 2015-03-23 MED ORDER — ASPIRIN-ACETAMINOPHEN-CAFFEINE 250-250-65 MG PO TABS
1.0000 | ORAL_TABLET | Freq: Four times a day (QID) | ORAL | Status: DC | PRN
  Filled 2015-03-23: qty 1

## 2015-03-23 MED ORDER — SODIUM CHLORIDE 0.9 % IV SOLN: INTRAVENOUS | Status: AC

## 2015-03-23 MED ORDER — TAMSULOSIN HCL 0.4 MG PO CAPS
0.4000 mg | ORAL_CAPSULE | Freq: Every day | ORAL | Status: DC
  Administered 2015-03-23 – 2015-03-24 (×2): 0.4 mg via ORAL
  Filled 2015-03-23 (×2): qty 1

## 2015-03-23 MED ORDER — SODIUM CHLORIDE 0.9 % IV SOLN
0.2500 mg/kg/h | INTRAVENOUS | Status: DC
  Filled 2015-03-23 (×2): qty 250

## 2015-03-23 MED ORDER — CHLORHEXIDINE GLUCONATE CLOTH 2 % EX PADS
6.0000 | MEDICATED_PAD | Freq: Every day | CUTANEOUS | Status: DC
  Administered 2015-03-24: 07:00:00 6 via TOPICAL

## 2015-03-23 MED ORDER — ASPIRIN 81 MG PO CHEW
CHEWABLE_TABLET | ORAL | Status: AC
  Administered 2015-03-23: 81 mg via ORAL
  Filled 2015-03-23: qty 1

## 2015-03-23 MED ORDER — ATORVASTATIN CALCIUM 40 MG PO TABS
40.0000 mg | ORAL_TABLET | Freq: Every day | ORAL | Status: DC
  Administered 2015-03-23: 22:00:00 40 mg via ORAL
  Filled 2015-03-23 (×2): qty 1

## 2015-03-23 MED ORDER — HEPARIN (PORCINE) IN NACL 2-0.9 UNIT/ML-% IJ SOLN
INTRAMUSCULAR | Status: AC
  Filled 2015-03-23: qty 1000

## 2015-03-23 MED ORDER — LISINOPRIL 10 MG PO TABS
10.0000 mg | ORAL_TABLET | Freq: Every day | ORAL | Status: DC
  Administered 2015-03-23 – 2015-03-24 (×2): 10 mg via ORAL
  Filled 2015-03-23 (×2): qty 1

## 2015-03-23 NOTE — CV Procedure (Signed)
Johnathan Keith is a 54 y.o. male    [id number] LOCATION:  FACILITY: [facility]  PHYSICIAN: Quay Burow, M.D. Feb 10, 1961   DATE OF PROCEDURE:  03/23/2015  DATE OF DISCHARGE:     CARDIAC CATHETERIZATION / PCI    History obtained from chart review.Tavish Gettis is a 54 y.o. male who presents for swelling of legs, he had called in and was scheduled an appt. Recent labs in 2/16 with K+ 4.8- kdur stopped, Cr. 0.89, Na 148 and uric acid 8.0 Currently on Lasix 80 mg daily.   In addition to hypertension, his cardiovascular history is significant for prior TIA subsequent to carotid artery disease. He recently underwent right carotid endarterectomy performed by Dr. Geryl Councilman on 09/26/2014. There is also question of remote myocardial infarction in the past. He recently underwent evaluation with a 2-D echocardiogram that was performed preoperatively which showed normal LV function as well as a Myoview stress test that had subtle inferior and lateral ischemia. At that time he had no SOB or chest pain and was cleared for surgery. His history is also notable for hyperlipidemia and diabetes. He was last seen by Dr. Gwenlyn Found 10/26/2014. During that time, he complained of symptomatic hypotension with systolic blood pressures in the low 90s. Subsequently Dr. Gwenlyn Found decided to decrease his lisinopril down from 40 mg to 20 mg daily and now down to 10 mg daily. He is taking a forth of 40 mg table. Since that time he has been seen by our office pharmacist Tommy Medal for labile blood pressures. Though over all BP is improved. He is on most of his meds.   He has been on lasix 80 mg daily for a year for lower ext. Edema. His K+ has been elevated at times and currently he is off the kdur. He has not had recent lipids.  In addition to lower ext edema he has DOE. No chest pain but walking in grocery store and to car he is very SOB. This has progressed since Nuc study. He has several risk factors: DM,  hyperlipidemia, vascular disease and family hx CAD. His nuclear study was high risk for anterolateral and inferior ischemia. Based on this, and in light of his increasing dyspnea on exertion as a potential anginal equivalent, he presents today for outpatient diagnostic coronary arteriography via the right radial approach to define his anatomy and rule out an ischemic etiology.   PROCEDURE DESCRIPTION:   The patient was brought to the second floor [facility] cath lab in the postabsorptive state. He was not premedicated . His right wrist was prepped and shaved in usual sterile fashion. Xylocaine 1% was used for local anesthesia. A 5/6 French sheath was inserted into the right radial artery using standard Seldinger technique. The patient received 5000 units  of heparin  intravenously.  5 [place] TIG catheter and pigtail catheters were used for selective coronary angiography and left ventriculography respectively. Visipaque dye was used for the entirety of the case. Retrograde aortic, left ventricular and pullback pressures were recorded.    HEMODYNAMICS:    AO SYSTOLIC/AO DIASTOLIC: 740/81   LV SYSTOLIC/LV DIASTOLIC: 448/18  ANGIOGRAPHIC RESULTS:   1. Left main; normal  2. LAD; 60-70% proximal, 80-90% mid 3. Left circumflex; normal.  4. Right coronary artery; dominant and normal 5. Left ventriculography; RAO left ventriculogram was performed using  25 mL of Visipaque dye at 12 mL/second. The overall LVEF estimated  60 %Without wall motion abnormalities  IMPRESSION:Mr. Milo has moderate proximal and high-grade mid LAD stenosis.  He has progressive dyspnea with an abnormal Myoview. We will proceed with FFR guided testing of the proximal LAD lesion to determine physiologically significance and stenting of the mid lesion plus or minus the proximal lesion with drug-eluting stents, Angiomax and Plavix.  Procedure description: The patient received Angiomax bolus with a 5539. Total contrast  and instructed to the patient was 210 mL. Using an XB LAD 3.5 centimeter curve guide catheter along with a Prime FFR wire the proximal lesion was crossed, adenosine infused an FFR determined at 0.75 suggesting that this was significant. Following this an L1 4/190 cm long pro-water guidewire was then advanced across both proximal and mid lesions in the LAD. The mid lesion was then dilated with a 2 mm x 12 mm balloon and stented with a 2.25 x 15 mm long Xience drug-eluting stent at up to 18 atm (2.46 mm) resulting reduction of a 90% lesion to 0% residual with a "step up/step down. Following this, the proximal lesion and underwent cutting balloon angioplasty with a 2.5 mm x 15 mm long angioscultp  Balloon. There was typical anterior ST segment elevation with balloon inflation which resolved promptly with deflation. The patient received 200 g of intra-coronary Twice during the case. A 2.75 x 18 mm long Xience drug eluding stent was then carefully positioned and deployed at 18 atm (3.1 mm) resulting reduction of a 70% proximal LAD stenosis to 0% residual. There was a moderate size diagonal which arose from within the lesion remained patent. I did not post-dilate the proximal lesion did not one risk losing the moderate-sized diagonal branch and there appeared to be excellent apposition with a distal step up.  Final impression: Successful proximal and mid LAD PCI and stenting using drug-eluting stent, dual antibiotic therapy with Plavix and Angiomax. The proximal LAD underwent FFR interrogation demonstrating physiologic significance. The sheath was removed and a TR band was placed on the right wrist to achieve patent hemostasis. The patient left the lab in stable condition. I will continue Angiomax full dose for  4 hours. He'll be discharged home in the morning on aspirin and Plavix and will see Cecilie Kicks RNP  in the office in one to 2 weeks and me back in 4-6 weeks.  Lorretta Harp MD, [facility] 03/23/2015 12:33  PM

## 2015-03-23 NOTE — H&P (View-Only) (Signed)
Cardiology Office Note   Date:  03/22/2015   ID:  Johnathan Keith, DOB 1961/08/20, MRN [id number]  PCP:  Myra Rude, MD  Cardiologist:  Dr. Allyson Sabal    Chief Complaint  Patient presents with  . Leg Swelling    PATIENT COMPLAIN OF SWELLING.      History of Present Illness: Johnathan Keith is a 54 y.o. male who presents for swelling of legs, he had called in and was scheduled an appt.  Recent labs in 2/16 with K+ 4.8-  kdur stopped,  Cr. 0.89, Na 148 and uric acid 8.0  Currently on Lasix 80 mg daily.   In addition to hypertension, his cardiovascular history is significant for prior TIA subsequent to carotid artery disease. He recently underwent right carotid endarterectomy performed by Dr. Claudie Fisherman on 09/26/2014. There is also question of remote myocardial infarction in the past. He recently underwent evaluation with a 2-D echocardiogram that was performed preoperatively which showed normal LV function as well as a Myoview stress test that had subtle inferior and lateral ischemia.  At that time he had no SOB or chest pain and was cleared for surgery. His history is also notable for hyperlipidemia and diabetes. He was last seen by Dr. Allyson Sabal 10/26/2014. During that time, he complained of symptomatic hypotension with systolic blood pressures in the low 90s. Subsequently Dr. Allyson Sabal decided to decrease his lisinopril down from 40 mg to 20 mg daily and now down to 10 mg daily.  He is taking a forth of 40 mg table.   Since that time he has been seen by our office pharmacist Phillips Hay for labile blood pressures. Though over all BP is improved.  He is on most of his meds.    He has been on lasix 80 mg daily for a year for lower ext. Edema.  His K+ has been elevated at times and currently he is off the kdur.  He has not had recent lipids.  In addition to lower ext edema he has DOE.  No chest pain but walking in grocery store and to car he is very SOB.  This has progressed since Nuc study.  He has  several risk factors:  DM, hyperlipidemia, vascular disease and family hx CAD.  He also has wound on lt gt toe, an ingrown toenail.  He will follow with his PCP but with vascular disease will check lower ext arterial dopplers.        Past Medical History  Diagnosis Date  . Hypertension   . Diabetes mellitus without complication   . High cholesterol   . Stroke   . Heart attack   . Diabetic retinopathy   . Peripheral vascular disease     carotid artery disease  . PONV (postoperative nausea and vomiting)     Past Surgical History  Procedure Laterality Date  . Hydrocele excision    . Refractive surgery Right 09-07-14    Diabetic retinopathy   . Circumcision    . Endarterectomy Right 09/26/2014    Procedure: RIGHT CAROTID ENDARTERECTOMY WITH PATCH ANGIOPLASTY;  Surgeon: Fransisco Hertz, MD;  Location: [facility] OR;  Service: Vascular;  Laterality: Right;     Current Outpatient Prescriptions  Medication Sig Dispense Refill  . aspirin 325 MG tablet Take 325 mg by mouth daily.    . Aspirin-Acetaminophen-Caffeine (EXCEDRIN MIGRAINE PO) Take 2 tablets by mouth as needed (for migraine).    Marland Kitchen atorvastatin (LIPITOR) 40 MG tablet Take 40 mg by mouth at bedtime.    Marland Kitchen  bethanechol (URECHOLINE) 25 MG tablet Take 25 mg by mouth 2 (two) times daily.     Marland Kitchen dicyclomine (BENTYL) 10 MG capsule Take 10 mg by mouth 2 (two) times daily.    . diphenoxylate-atropine (LOMOTIL) 2.5-0.025 MG per tablet Take 1 tablet by mouth 2 (two) times daily as needed.     . furosemide (LASIX) 80 MG tablet Take 1 tablet (80 mg total) by mouth daily. 90 tablet 0  . insulin NPH-regular Human (NOVOLIN 70/30) (70-30) 100 UNIT/ML injection Inject 50 Units into the skin See admin instructions. Take 50 units with breakfast and dinner,  May take 50 units at bedtime as needed    . metFORMIN (GLUCOPHAGE-XR) 500 MG 24 hr tablet Take 500 mg by mouth daily.    . metoprolol tartrate (LOPRESSOR) 25 MG tablet Take 1 tablet (25 mg total) by mouth  2 (two) times daily. 180 tablet 3  . omeprazole (PRILOSEC) 20 MG capsule Take 20 mg by mouth daily. Prn    . tamsulosin (FLOMAX) 0.4 MG CAPS capsule Take 1 capsule (0.4 mg total) by mouth daily. 30 capsule 1  . terbinafine (LAMISIL) 250 MG tablet Take 250 mg by mouth daily.    Marland Kitchen VITAMIN D, CHOLECALCIFEROL, PO Take 1 tablet by mouth daily.     No current facility-administered medications for this visit.    Allergies:   Review of patient's allergies indicates no known allergies.    Social History:  The patient  reports that he has never smoked. He has never used smokeless tobacco. He reports that he does not drink alcohol or use illicit drugs.   Family History:  The patient's family history includes Asthma in his brother; Cancer in his father; Colon cancer in his sister; Diabetes in his brother, maternal grandfather, and mother; Gout in his brother and maternal uncle; Heart disease in his father and mother; Hyperlipidemia in his mother; Hypertension in his mother.    ROS:  General:no colds or fevers, no weight changes Skin:no rashes + ulcers on lt great toe HEENT:no blurred vision, no congestion CV:see HPI PUL:see HPI GI:occ diarrhea and constipation followed by GI, no melena, no indigestion GU:no hematuria, no dysuria MS:no joint pain, no claudication Neuro:no syncope, no lightheadedness, + migraines Endo:+ diabetes stated it was not as well controlled., no thyroid disease  Wt Readings from Last 3 Encounters:  03/22/15 224 lb 4.8 oz (101.742 kg)  03/13/15 225 lb 8 oz (102.286 kg)  01/29/15 229 lb 8 oz (104.101 kg)     PHYSICAL EXAM: VS:  BP 126/80 mmHg  Pulse 69  Ht 5\' 6"  (1.676 m)  Wt 224 lb 4.8 oz (101.742 kg)  BMI 36.22 kg/m2 , BMI Body mass index is 36.22 kg/(m^2). General:Pleasant affect, NAD Skin:Warm and dry, brisk capillary refill HEENT:normocephalic, sclera clear, mucus membranes moist Neck:supple, no JVD, no bruits, no adenopathy  Heart:S1S2 RRR without murmur,  gallup, rub or click Lungs:clear without rales, rhonchi, or wheezes TRR:NHAF, non tender, + BS, do not palpate liver spleen or masses Ext: lt lower ext edema 1+, tr on rt, 1+ pedal pulses, 2+ radial pulses Neuro:alert and oriented X 3, MAE, follows commands, + facial symmetry    EKG:  EKG is ordered today. The ekg ordered today demonstrates SR rate of 69 no changes from 09/2014.    Recent Labs: 07/27/2014: TSH 2.860 09/22/2014: ALT 29 09/27/2014: BUN 17; Creatinine 0.69; Hemoglobin 12.4*; Platelets 221; Potassium 4.0; Sodium 139    Lipid Panel    Component Value Date/Time  CHOL 156 07/27/2014 0843   TRIG 210* 07/27/2014 0843   HDL 30* 07/27/2014 0843   CHOLHDL 5.2* 07/27/2014 0843   LDLCALC 84 07/27/2014 0843       Other studies Reviewed: Additional studies/ records that were reviewed today include: previous echo and nuc study, notes..   ASSESSMENT AND PLAN:  1.  DOE, increasing- with low risk nuc study 09/2014- however it did show mild anteroapical and inferobasal hypoperfusion. While this did not meet the level of significance that would have necessitated invasive investigation prior to his surgery it does raise the issue that he may have subclinical myocardial ischemia.  With increasing dyspnea this may be anginal equivalent.  Discussed with Dr. Allyson Sabal and we both discussed with pt.  It would be prudent to proceed with cardiac cath with increasing DOE and his multiple risk factors.  Pt is agreeable.  Pt follow up wil be determined at time of cath.  The patient understands that risks included but are not limited to stroke (1 in 1000), death (1 in 1000), kidney failure [usually temporary] (1 in 500), bleeding (1 in 200), allergic reaction [possibly serious] (1 in 200).  He did relate with surgery he had confusion with narcotics   2.  Carotid artery stenosis with cerebral infarction 2015   Status post successful uncomplicated right carotid endarterectomy performed by Dr. [name] 09/26/14.  3.  HTN (hypertension) Blood pressure today is stable, on his list it has been fairly stable with occ elevation..   4.  HLD (hyperlipidemia) History of hyperlipidemia on atorvastatin 40 mg. His recent lipid profile performed 07/27/14 revealed an LDL of 84 and HDL of 30. He does admit to dietary indiscretion and is close to being at goal for secondary prevention. Talked about dietary modification. Will recheck lipids prior to procedure  5.  DM 2 not as well controlled- followed by PCP  6. Non healing wound- ingrown toe nail.  Will check lower ext art. dopplers   Current medicines are reviewed with the patient today.  The patient Has no concerns regarding medicines.  The following changes have been made:  See above Labs/ tests ordered today include:see above  Disposition:   FU:  see above  Signed, Leone Brand, NP  03/22/2015 11:11 AM    [facility] [address] [place], [place], [place]  [address]/ [address] [place], [place] Phone: [phone]; Fax: [phone]  [phone]

## 2015-03-23 NOTE — Interval H&P Note (Signed)
Cath Lab Visit (complete for each Cath Lab visit)  Clinical Evaluation Leading to the Procedure:   ACS: No.  Non-ACS:    Anginal Classification: CCS III  Anti-ischemic medical therapy: Minimal Therapy (1 class of medications)  Non-Invasive Test Results: Intermediate-risk stress test findings: cardiac mortality 1-3%/year  Prior CABG: No previous CABG      History and Physical Interval Note:  03/23/2015 10:54 AM  Johnathan Keith  has presented today for surgery, with the diagnosis of cad  The various methods of treatment have been discussed with the patient and family. After consideration of risks, benefits and other options for treatment, the patient has consented to  Procedure(s): LEFT HEART CATHETERIZATION WITH CORONARY ANGIOGRAM (N/A) as a surgical intervention .  The patient's history has been reviewed, patient examined, no change in status, stable for surgery.  I have reviewed the patient's chart and labs.  Questions were answered to the patient's satisfaction.     Runell Gess

## 2015-03-24 ENCOUNTER — Telehealth: Payer: Self-pay | Admitting: Cardiovascular Disease

## 2015-03-24 ENCOUNTER — Telehealth: Payer: Self-pay | Admitting: *Deleted

## 2015-03-24 DIAGNOSIS — I10 Essential (primary) hypertension: Secondary | ICD-10-CM

## 2015-03-24 DIAGNOSIS — E785 Hyperlipidemia, unspecified: Secondary | ICD-10-CM

## 2015-03-24 DIAGNOSIS — E1151 Type 2 diabetes mellitus with diabetic peripheral angiopathy without gangrene: Secondary | ICD-10-CM

## 2015-03-24 DIAGNOSIS — Z0289 Encounter for other administrative examinations: Secondary | ICD-10-CM

## 2015-03-24 DIAGNOSIS — R931 Abnormal findings on diagnostic imaging of heart and coronary circulation: Secondary | ICD-10-CM

## 2015-03-24 DIAGNOSIS — R0609 Other forms of dyspnea: Secondary | ICD-10-CM

## 2015-03-24 DIAGNOSIS — I6529 Occlusion and stenosis of unspecified carotid artery: Secondary | ICD-10-CM | POA: Diagnosis not present

## 2015-03-24 DIAGNOSIS — I2510 Atherosclerotic heart disease of native coronary artery without angina pectoris: Secondary | ICD-10-CM | POA: Diagnosis not present

## 2015-03-24 DIAGNOSIS — Z0279 Encounter for issue of other medical certificate: Secondary | ICD-10-CM

## 2015-03-24 LAB — CBC
HCT: 39 % (ref 39.0–52.0)
Hemoglobin: 13 g/dL (ref 13.0–17.0)
MCH: 29.1 pg (ref 26.0–34.0)
MCHC: 33.3 g/dL (ref 30.0–36.0)
MCV: 87.2 fL (ref 78.0–100.0)
Platelets: 229 10*3/uL (ref 150–400)
RBC: 4.47 MIL/uL (ref 4.22–5.81)
RDW: 15.4 % (ref 11.5–15.5)
WBC: 9.7 10*3/uL (ref 4.0–10.5)

## 2015-03-24 LAB — BASIC METABOLIC PANEL
Anion gap: 12 (ref 5–15)
BUN: 16 mg/dL (ref 6–23)
CO2: 25 mmol/L (ref 19–32)
Calcium: 8.4 mg/dL (ref 8.4–10.5)
Chloride: 103 mmol/L (ref 96–112)
Creatinine, Ser: 0.78 mg/dL (ref 0.50–1.35)
GFR calc Af Amer: >90 mL/min (ref >90)
GFR calc non Af Amer: >90 mL/min (ref >90)
Glucose, Bld: 229 mg/dL — ABNORMAL HIGH (ref 70–99)
Potassium: 3.5 mmol/L (ref 3.5–5.1)
Sodium: 140 mmol/L (ref 135–145)

## 2015-03-24 LAB — GLUCOSE, CAPILLARY
Glucose-Capillary: 193 mg/dL — ABNORMAL HIGH (ref 70–99)
Glucose-Capillary: 210 mg/dL — ABNORMAL HIGH (ref 70–99)

## 2015-03-24 MED ORDER — POTASSIUM CHLORIDE CRYS ER 20 MEQ PO TBCR
20.0000 meq | EXTENDED_RELEASE_TABLET | Freq: Once | ORAL | Status: AC
  Administered 2015-03-24: 10:00:00 20 meq via ORAL
  Filled 2015-03-24: qty 1

## 2015-03-24 MED ORDER — ACETAMINOPHEN 325 MG PO TABS: 650.0000 mg | ORAL_TABLET | ORAL | Status: DC | PRN

## 2015-03-24 MED ORDER — ASPIRIN 81 MG PO CHEW: 81.0000 mg | CHEWABLE_TABLET | Freq: Every day | ORAL | Status: DC

## 2015-03-24 MED ORDER — NITROGLYCERIN 0.4 MG SL SUBL: 0.4000 mg | SUBLINGUAL_TABLET | SUBLINGUAL | Status: DC | PRN

## 2015-03-24 MED ORDER — CLOPIDOGREL BISULFATE 75 MG PO TABS: 75.0000 mg | ORAL_TABLET | Freq: Every day | ORAL | Status: DC

## 2015-03-24 MED ORDER — METFORMIN HCL ER 500 MG PO TB24: 500.0000 mg | ORAL_TABLET | Freq: Every day | ORAL | Status: DC

## 2015-03-24 MED FILL — Sodium Chloride IV Soln 0.9%: INTRAVENOUS | Qty: 50 | Status: AC

## 2015-03-24 NOTE — Progress Notes (Signed)
CARDIAC REHAB PHASE I   PRE:  Rate/Rhythm: 72 SR  BP:  Sitting: 159/82        SaO2: 92 RA  MODE:  Ambulation: 1000 ft   POST:  Rate/Rhythm: 81 SR  BP:  Sitting: 171/89         SaO2: 95 Ra  Pt ambulated 1000 ft on RA, handheld assist, with slightly unsteady gait at baseline, tolerated well.  Pt states he has had a stroke and has difficulty with balance, turning and spatial orientation and occasionally uses a walker at home.  Pt also states he has neuropathy that affects his gait. Pt c/o of mild DOE, denies cp, dizziness, declined rest stop. Completed stent education.  Reviewed anti-platelet therapy, stent card, activity restrictions, ntg, exercise, heart healthy diet, carb counting, portion control, phase 2 cardiac rehab. Pt wife at bedside. Pt and wife verbalized understanding. Pt agrees to phase 2 cardiac rehab. Will send referral to [facility].    [id number]  Joylene Grapes, RN, BSN 03/24/2015 9:23 AM

## 2015-03-24 NOTE — Progress Notes (Signed)
    Subjective:  No chest pain  Objective:  Vital Signs in the last 24 hours: Temp:  [97.6 F (36.4 C)-98.7 F (37.1 C)] 98.4 F (36.9 C) (04/15 0812) Pulse Rate:  [68-90] 71 (04/15 0812) Resp:  [12-19] 17 (04/15 0812) BP: (122-168)/(45-91) 159/82 mmHg (04/15 0812) SpO2:  [89 %-97 %] 94 % (04/15 0812) Weight:  [223 lb 12.3 oz (101.5 kg)-224 lb (101.606 kg)] 223 lb 12.3 oz (101.5 kg) (04/15 0400)  Intake/Output from previous day:  Intake/Output Summary (Last 24 hours) at 03/24/15 0827 Last data filed at 03/24/15 0700  Gross per 24 hour  Intake 1854.28 ml  Output   1600 ml  Net 254.28 ml    Physical Exam: General appearance: alert, cooperative, no distress and moderately obese Neck: Rt CAE scar Lungs: clear to auscultation bilaterally Heart: regular rate and rhythm Abdomen: obese Extremities: Rt radial site without heamtoma Skin: Skin color, texture, turgor normal. No rashes or lesions Neurologic: Grossly normal   Rate: 74  Rhythm: normal sinus rhythm  Lab Results:  Recent Labs  03/22/15 1349 03/24/15 0410  WBC 11.6* 9.7  HGB 14.4 13.0  PLT 283 229    Recent Labs  03/22/15 1136 03/24/15 0410  NA 144 140  K 4.5 3.5  CL 103 103  CO2 28 25  GLUCOSE 135* 229*  BUN 22 16  CREATININE 0.81 0.78   No results for input(s): TROPONINI in the last 72 hours.  Invalid input(s): CK, MB  Recent Labs  03/22/15 1349  INR 1.00    Imaging: Imaging results have been reviewed  Cardiac Studies:  Assessment/Plan:   Principal Problem:   Abnormal nuclear cardiac imaging test Active Problems:   Dyspnea on exertion   Type 2 diabetes mellitus with vascular disease   Essential hypertension   CVA (cerebral vascular accident)   Carotid artery stenosis-Rt CEA Oct 2015   Hyperlipidemia   PLAN: Discharge. F/U Dr Allyson Sabal and Dr Natalia [facility] PA-C Beeper [phone] 03/24/2015, 8:27 AM  I have examined the patient and reviewed assessment and plan and discussed  with patient.  Agree with above as stated.  Stressed importance of DAPT.  He needs RF modification.   Fremon Zacharia S.

## 2015-03-24 NOTE — Discharge Summary (Signed)
Patient ID: Johnathan Keith,  MRN: [id number], DOB/AGE: 06-13-1961 54 y.o.  Admit date: 03/23/2015 Discharge date: 03/24/2015  Primary Care Provider: Myra Rude, MD Primary Cardiologist: Dr Allyson Sabal  Discharge Diagnoses Principal Problem:   Abnormal nuclear cardiac imaging test Active Problems:   Dyspnea on exertion   Type 2 diabetes mellitus with vascular disease   Essential hypertension   CVA (cerebral vascular accident)   Carotid artery stenosis-Rt CEA Oct 2015   Hyperlipidemia    Procedures: Coronary angiogram/ LAD DES 03/23/15   Hospital Course:  54 y/o seen by Dr Allyson Sabal Oct 9th 2015 for pre op carotid surgery clearance. The pt had an echo and a Myoview. The Myoview was abnormal but low risk. The pt was asymptomatic and on 09/26/14 he had Rt CEA without complications. He later developed DOE and was seen in the office 03/22/15. It was decided to proceed with diagnostic cath. This was done 03/23/15 and revealed LAD disease. He underwent placement of an LAD DES and tolerated this well. He is stable for discharge 03/24/15.   Discharge Vitals:  Blood pressure 159/82, pulse 71, temperature 98.4 F (36.9 C), temperature source Oral, resp. rate 17, height  (1.676 m), weight 223 lb 12.3 oz (101.5 kg), SpO2 94 %.    Labs: Results for orders placed or performed during the hospital encounter of 03/23/15 (from the past 24 hour(s))  POCT Activated clotting time     Status: None   Collection Time: 03/23/15 11:38 AM  Result Value Ref Range   Activated Clotting Time 534 seconds  Glucose, capillary     Status: Abnormal   Collection Time: 03/23/15 12:52 PM  Result Value Ref Range   Glucose-Capillary 225 (H) 70 - 99 mg/dL   Comment 1 Document in Chart   MRSA PCR Screening     Status: Abnormal   Collection Time: 03/23/15  1:20 PM  Result Value Ref Range   MRSA by PCR POSITIVE (A) NEGATIVE  Glucose, capillary     Status: Abnormal   Collection Time: 03/23/15  5:33 PM  Result  Value Ref Range   Glucose-Capillary 289 (H) 70 - 99 mg/dL  Glucose, capillary     Status: Abnormal   Collection Time: 03/23/15  9:29 PM  Result Value Ref Range   Glucose-Capillary 295 (H) 70 - 99 mg/dL   Comment 1 Notify RN    Comment 2 Document in Chart   Glucose, capillary     Status: Abnormal   Collection Time: 03/24/15  1:15 AM  Result Value Ref Range   Glucose-Capillary 193 (H) 70 - 99 mg/dL  Basic metabolic panel     Status: Abnormal   Collection Time: 03/24/15  4:10 AM  Result Value Ref Range   Sodium 140 135 - 145 mmol/L   Potassium 3.5 3.5 - 5.1 mmol/L   Chloride 103 96 - 112 mmol/L   CO2 25 19 - 32 mmol/L   Glucose, Bld 229 (H) 70 - 99 mg/dL   BUN 16 6 - 23 mg/dL   Creatinine, Ser 4.09 0.50 - 1.35 mg/dL   Calcium 8.4 8.4 - 81.1 mg/dL   GFR calc non Af Amer >90 >90 mL/min   GFR calc Af Amer >90 >90 mL/min   Anion gap 12 5 - 15  CBC     Status: None   Collection Time: 03/24/15  4:10 AM  Result Value Ref Range   WBC 9.7 4.0 - 10.5 K/uL   RBC 4.47 4.22 - 5.81 MIL/uL  Hemoglobin 13.0 13.0 - 17.0 g/dL   HCT 16.1 09.6 - 04.5 %   MCV 87.2 78.0 - 100.0 fL   MCH 29.1 26.0 - 34.0 pg   MCHC 33.3 30.0 - 36.0 g/dL   RDW 40.9 81.1 - 91.4 %   Platelets 229 150 - 400 K/uL  Glucose, capillary     Status: Abnormal   Collection Time: 03/24/15  6:41 AM  Result Value Ref Range   Glucose-Capillary 210 (H) 70 - 99 mg/dL    Disposition:      Follow-up Information    Follow up with [facility] R, NP.   Specialty:  Cardiology   Why:  office will call you   Contact information:   [address] [place] [address] [phone]       Discharge Medications:    Medication List    STOP taking these medications        aspirin EC 325 MG tablet  Replaced by:  aspirin 81 MG chewable tablet      TAKE these medications        acetaminophen 325 MG tablet  Commonly known as:  TYLENOL  Take 2 tablets (650 mg total) by mouth every 4 (four) hours as needed for  headache or mild pain.     aspirin 81 MG chewable tablet  Chew 1 tablet (81 mg total) by mouth daily.     atorvastatin 40 MG tablet  Commonly known as:  LIPITOR  Take 40 mg by mouth at bedtime.     bethanechol 25 MG tablet  Commonly known as:  URECHOLINE  Take 25 mg by mouth 2 (two) times daily.     clopidogrel 75 MG tablet  Commonly known as:  PLAVIX  Take 1 tablet (75 mg total) by mouth daily with breakfast.     dicyclomine 10 MG capsule  Commonly known as:  BENTYL  Take 10 mg by mouth 3 (three) times daily before meals.     diphenoxylate-atropine 2.5-0.025 MG per tablet  Commonly known as:  LOMOTIL  Take 1 tablet by mouth 3 (three) times daily.     EXCEDRIN MIGRAINE PO  Take 1 tablet by mouth daily as needed (for migraine).     furosemide 80 MG tablet  Commonly known as:  LASIX  Take 1 tablet (80 mg total) by mouth daily.     insulin NPH-regular Human (70-30) 100 UNIT/ML injection  Commonly known as:  NOVOLIN 70/30  Inject 50 Units into the skin 4 (four) times daily - after meals and at bedtime.     lisinopril 20 MG tablet  Commonly known as:  PRINIVIL,ZESTRIL  Take 0.5 tablets (10 mg total) by mouth daily.     metFORMIN 500 MG 24 hr tablet  Commonly known as:  GLUCOPHAGE-XR  Take 1 tablet (500 mg total) by mouth daily with supper.  Start taking on:  03/26/2015     metoprolol tartrate 25 MG tablet  Commonly known as:  LOPRESSOR  Take 1 tablet (25 mg total) by mouth 2 (two) times daily.     nitroGLYCERIN 0.4 MG SL tablet  Commonly known as:  NITROSTAT  Place 1 tablet (0.4 mg total) under the tongue every 5 (five) minutes as needed for chest pain.     omeprazole 20 MG capsule  Commonly known as:  PRILOSEC  Take 20 mg by mouth daily as needed (acid reflux/ heartburn).     SIMPLY SALINE NA  Place 1 spray into both nostrils 2 (  two) times daily.     SYSTANE OP  Place 1 drop into both eyes 2 (two) times daily.     tamsulosin 0.4 MG Caps capsule  Commonly  known as:  FLOMAX  Take 1 capsule (0.4 mg total) by mouth daily.     terbinafine 250 MG tablet  Commonly known as:  LAMISIL  Take 250 mg by mouth daily.     Vitamin D 2000 UNITS tablet  Take 2,000 Units by mouth daily.         Duration of Discharge Encounter: Greater than 30 minutes including physician time.  Signed, Corine Shelter PA-C 03/24/2015   I have examined the patient and reviewed assessment and plan and discussed with patient.  Agree with above as stated.  Stressed importance of DAPT.  He needs RF modification. Holding metformin until 4/17. Radial site stable.  No hematoma.  Weight loss and DM control.  8:39 AM

## 2015-03-24 NOTE — Discharge Instructions (Signed)
Radial Site Care °Refer to this sheet in the next few weeks. These instructions provide you with information on caring for yourself after your procedure. Your caregiver may also give you more specific instructions. Your treatment has been planned according to current medical practices, but problems sometimes occur. Call your caregiver if you have any problems or questions after your procedure. °HOME CARE INSTRUCTIONS °· You may shower the day after the procedure. Remove the bandage (dressing) and gently wash the site with plain soap and water. Gently pat the site dry. °· Do not apply powder or lotion to the site. °· Do not submerge the affected site in water for 3 to 5 days. °· Inspect the site at least twice daily. °· Do not flex or bend the affected arm for 24 hours. °· No lifting over 5 pounds (2.3 kg) for 5 days after your procedure. °· Do not drive home if you are discharged the same day of the procedure. Have someone else drive you. °· You may drive 24 hours after the procedure unless otherwise instructed by your caregiver. °· Do not operate machinery or power tools for 24 hours. °· A responsible adult should be with you for the first 24 hours after you arrive home. °What to expect: °· Any bruising will usually fade within 1 to 2 weeks. °· Blood that collects in the tissue (hematoma) may be painful to the touch. It should usually decrease in size and tenderness within 1 to 2 weeks. °SEEK IMMEDIATE MEDICAL CARE IF: °· You have unusual pain at the radial site. °· You have redness, warmth, swelling, or pain at the radial site. °· You have drainage (other than a small amount of blood on the dressing). °· You have chills. °· You have a fever or persistent symptoms for more than 72 hours. °· You have a fever and your symptoms suddenly get worse. °· Your arm becomes pale, cool, tingly, or numb. °· You have heavy bleeding from the site. Hold pressure on the site. °Document Released: 12/28/2010 Document Revised:  02/17/2012 Document Reviewed: 12/28/2010 °ExitCare® Patient Information ©2015 [employer]. This information is not intended to replace advice given to you by your health care provider. Make sure you discuss any questions you have with your health care provider. °Coronary Angiogram With Stent, Care After °Refer to this sheet in the next few weeks. These instructions provide you with information on caring for yourself after your procedure. Your health care provider may also give you more specific instructions. Your treatment has been planned according to current medical practices, but problems sometimes occur. Call your health care provider if you have any problems or questions after your procedure.  °WHAT TO EXPECT AFTER THE PROCEDURE  °The insertion site may be tender for a few days after your procedure. °HOME CARE INSTRUCTIONS  °· Take medicines only as directed by your health care provider. Blood thinners may be prescribed after your procedure to improve blood flow through the stent. °· Change any bandages (dressings) as directed by your health care provider.   °· Check your insertion site every day for redness, swelling, or fluid leaking from the insertion.   °· Do not take baths, swim, or use a hot tub until your health care provider approves. You may shower. Pat the insertion area dry. Do not rub the insertion area with a washcloth or towel.   °· Eat a heart-healthy diet. This should include plenty of fresh fruits and vegetables. Meat should be lean cuts. Avoid the following types of food:   °·   Food that is high in salt.   °· Canned or highly processed food.   °· Food that is high in saturated fat or sugar.   °· Fried food.   °· Make any other lifestyle changes recommended by your health care provider. This may include:   °· Not using any tobacco products including cigarettes, chewing tobacco, or electronic cigarettes.  °· Managing your weight.   °· Getting regular exercise.   °· Managing your blood pressure.    °· Limiting your alcohol intake.   °· Managing other health problems, such as diabetes.   °· If you need an MRI after your heart stent was placed, be sure to tell the health care provider who orders the MRI that you have a heart stent.   °· Keep all follow-up visits as directed by your health care provider.   °SEEK IMMEDIATE MEDICAL CARE IF:  °· You develop chest pain, shortness of breath, feel faint, or pass out. °· You have bleeding, swelling larger than a walnut, or drainage from the catheter insertion site. °· You develop pain, discoloration, coldness, or severe bruising in the leg or arm that held the catheter. °· You develop bleeding from any other place such as from the bowels. There may be bright red blood in the urine or stools, or it may appear as black, tarry stools. °· You have a fever or chills. °MAKE SURE YOU: °· Understand these instructions. °· Will watch your condition. °· Will get help right away if you are not doing well or get worse. °Document Released: 06/14/2005 Document Revised: 04/11/2014 Document Reviewed: 04/28/2013 °ExitCare® Patient Information ©2015 [employer]. This information is not intended to replace advice given to you by your health care provider. Make sure you discuss any questions you have with your health care provider. ° °

## 2015-03-24 NOTE — Telephone Encounter (Signed)
Form,[employer] sent to [facility] and Dr Roda Shutters 03-24-15.

## 2015-03-27 ENCOUNTER — Telehealth (HOSPITAL_COMMUNITY): Payer: Self-pay | Admitting: *Deleted

## 2015-03-28 NOTE — Telephone Encounter (Signed)
Placed in MR for pick up (send with attached note).

## 2015-03-28 NOTE — Telephone Encounter (Signed)
Form done. Put on your desk. Fax along with my clinic note. Thanks.  Marvel Plan, MD PhD Stroke Neurology 03/28/2015 3:02 PM

## 2015-03-28 NOTE — Telephone Encounter (Signed)
Closed encounter °

## 2015-03-28 NOTE — Telephone Encounter (Signed)
Form to Dr. Xu. 

## 2015-03-29 ENCOUNTER — Telehealth: Payer: Self-pay | Admitting: *Deleted

## 2015-03-29 ENCOUNTER — Ambulatory Visit
Payer: No Typology Code available for payment source | Admitting: Pharmacist Clinician (PhC)/ Clinical Pharmacy Specialist

## 2015-03-29 NOTE — Telephone Encounter (Signed)
Form,[employer] received,completed by Dr Roda Shutters and Andrey Campanile faxed 03-29-15.

## 2015-03-30 ENCOUNTER — Telehealth: Payer: Self-pay | Admitting: Pharmacist Clinician (PhC)/ Clinical Pharmacy Specialist

## 2015-03-30 NOTE — Telephone Encounter (Signed)
LMOM for patient to return call to discuss BP readings he brought by the office last week

## 2015-03-30 NOTE — Telephone Encounter (Signed)
Pt returned call.  Reviewed BP readings:  avg 129/72, with high of 178 and low of 87 (systolic).   Pt recently had heart cath and states that since that time his BP readings have all been <145.  He's feeling great right now.   Advised to continue with current regimen and call in 3-4 weeks if his BP starts to rise with systolic readings in the 150-170s.  Pt voiced understanding.

## 2015-04-04 ENCOUNTER — Telehealth: Payer: Self-pay | Admitting: Cardiovascular Disease

## 2015-04-04 NOTE — Telephone Encounter (Signed)
Spoke with patient, he hasn't had lisinopril for last 4 days.  BP still <100 systolic for most readings.    Last BMET was just on 4/15, showed normal renal function.    Suggested to patient that he increase the sodium in his diet just a little, as well as increase fluids, as he may be dehydrated.  If his pressure continues to stay low, may consider having him cut furosemide dose from 80 to 40 mg.  Advised that he continue to watch his pressure, call in 1 week to let me know how he is doing.  Patient voiced understanding

## 2015-04-04 NOTE — Telephone Encounter (Signed)
Pt has been doing med mgmt w/ Belenda Cruise, BP checks w/ averages per her instructions.  Reports his BPs have been running low past couple days. Does not isolate any changes to diet or routine. He confirms medications taken as directed.  He did hold his lisinopril today - BP checked this AM was 87/50. Of his BP readings over last 3 days, 3-4x a day, he's had 3 that have been 100+ systolic.  Pt felt a little dizzy this AM, a headache, o/w no symptoms noted.  Pt & I discussed continuing to hold the lisinopril for now - he is currently on 10mg  daily. Also taking lasix 80mg  daily and metoprolol 25mg  BID.  Will route to [place] to advise, as she has been working w/ him on this.

## 2015-04-04 NOTE — Telephone Encounter (Signed)
Pt says his blood pressure have been running low for the last 4 days.Please call to advise,this morning it was 87/50.

## 2015-04-06 ENCOUNTER — Encounter: Payer: Self-pay | Admitting: Vascular Surgery

## 2015-04-07 ENCOUNTER — Ambulatory Visit (INDEPENDENT_AMBULATORY_CARE_PROVIDER_SITE_OTHER): Payer: No Typology Code available for payment source | Admitting: Vascular Surgery

## 2015-04-07 ENCOUNTER — Ambulatory Visit (HOSPITAL_COMMUNITY)
Admission: RE | Admit: 2015-04-07 | Discharge: 2015-04-07 | Disposition: A | Discharge status: Home / Self Care | Payer: No Typology Code available for payment source | Source: Ambulatory Visit | Attending: Vascular Surgery | Admitting: Vascular Surgery

## 2015-04-07 ENCOUNTER — Encounter: Payer: Self-pay | Admitting: Vascular Surgery

## 2015-04-07 VITALS — BP 126/79 | HR 78 | Ht 66.0 in | Wt 224.0 lb

## 2015-04-07 DIAGNOSIS — I6521 Occlusion and stenosis of right carotid artery: Secondary | ICD-10-CM

## 2015-04-07 DIAGNOSIS — I6523 Occlusion and stenosis of bilateral carotid arteries: Secondary | ICD-10-CM

## 2015-04-07 DIAGNOSIS — I639 Cerebral infarction, unspecified: Secondary | ICD-10-CM | POA: Insufficient documentation

## 2015-04-07 DIAGNOSIS — IMO0002 Reserved for concepts with insufficient information to code with codable children: Secondary | ICD-10-CM

## 2015-04-07 NOTE — Progress Notes (Signed)
    Established Carotid Patient  History of Present Illness  Johnathan Keith is a 54 y.o. (January 31, 1961) male who presents with chief complaint: routine follow-up.  The patient is s/p R CEA on 09/26/14.  Patient has known history of TIA or stroke symptom.  The patient has never had amaurosis fugax or monocular blindness.  The patient has had left sided weakness.  The patient has never had receptive or expressive aphasia.    The patient's previous neurologic deficits have resolved.  The patient's PMH, PSH, SH, FamHx, Med, and Allergies are unchanged from 10/07/14.  On ROS today: no further weakness, radiating pain from R neck, burning sensation R face, mild jawline numbness  Physical Examination  Filed Vitals:   04/07/15 1638 04/07/15 1642  BP: 153/92 126/79  Pulse: 78   Height: 5\' 6"  (1.676 m)   Weight: 224 lb (101.606 kg)   SpO2: 95%    Body mass index is 36.17 kg/(m^2).  General: A&O x 3, WD, Obese,   Eyes: PERRLA, EOMI  Neck: Supple, no nuchal rigidity, no palpable LAD  Pulmonary: Sym exp, good air movt, CTAB, no rales, rhonchi, & wheezing  Cardiac: RRR, Nl S1, S2, no Murmurs, rubs or gallops  Vascular: Vessel Right Left  Radial Palpable Palpable  Brachial Palpable Palpable  Carotid Palpable, without bruit Palpable, without bruit  Aorta Not palpable N/A  Femoral Palpable Palpable  Popliteal Not palpable Not palpable  PT Not Palpable Not Palpable  DP Palpable Palpable   Gastrointestinal: soft, NTND, -G/R, - HSM, - masses, - CVAT B  Musculoskeletal: M/S 5/5 throughout , Extremities without ischemic changes   Neurologic: CN 2-12 intact , Pain and light touch intact in extremities , Motor exam as listed above  Non-Invasive Vascular Imaging  CAROTID DUPLEX (Date: 04/07/2015):   R ICA stenosis: patent CEA  R VA: patent and antegrade  L ICA stenosis: <40%  L VA: patent and antegrade   Medical Decision Making  Johnathan Keith is a 54 y.o. male who presents  with: asx L ICA stenosis <40%, s/p R CEA for sx R ICA stenosis >80%   Based on the patient's vascular studies and examination, I have offered the patient: q6 month carotid duplex.  I discussed in depth with the patient the nature of atherosclerosis, and emphasized the importance of maximal medical management including strict control of blood pressure, blood glucose, and lipid levels, antiplatelet agents, obtaining regular exercise, and cessation of smoking.    The patient is aware that without maximal medical management the underlying atherosclerotic disease process will progress, limiting the benefit of any interventions. The patient is currently on a statin: Lipitor. The patient is currently on an anti-platelet: ASA.  Thank you for allowing [place] to participate in this patient's care.  [name], MD [facility] Office: [phone] Pager: [phone]  04/07/2015, 6:57 PM

## 2015-04-09 HISTORY — PX: EYE SURGERY: SHX253

## 2015-04-10 ENCOUNTER — Other Ambulatory Visit: Payer: Self-pay | Admitting: *Deleted

## 2015-04-10 DIAGNOSIS — I6523 Occlusion and stenosis of bilateral carotid arteries: Secondary | ICD-10-CM

## 2015-04-13 ENCOUNTER — Other Ambulatory Visit: Payer: Self-pay | Admitting: Cardiovascular Disease

## 2015-04-13 ENCOUNTER — Ambulatory Visit (HOSPITAL_COMMUNITY)
Admission: RE | Admit: 2015-04-13 | Discharge: 2015-04-13 | Disposition: A | Discharge status: Home / Self Care | Payer: No Typology Code available for payment source | Source: Ambulatory Visit | Attending: Cardiology | Admitting: Cardiology

## 2015-04-13 DIAGNOSIS — I739 Peripheral vascular disease, unspecified: Secondary | ICD-10-CM

## 2015-04-13 DIAGNOSIS — T148XXA Other injury of unspecified body region, initial encounter: Secondary | ICD-10-CM

## 2015-04-13 NOTE — Progress Notes (Signed)
Lower Ext. Arterial Duplex Completed. Normal ABIs, TBIs and duplex imaging of the lower extremities without evidence of stenosis.  Marilynne Halsted, BS, RDMS, RVT

## 2015-04-18 ENCOUNTER — Ambulatory Visit (INDEPENDENT_AMBULATORY_CARE_PROVIDER_SITE_OTHER): Payer: No Typology Code available for payment source | Admitting: Cardiology

## 2015-04-18 ENCOUNTER — Encounter: Payer: Self-pay | Admitting: Cardiology

## 2015-04-18 VITALS — BP 114/76 | HR 67 | Ht 66.0 in | Wt 223.3 lb

## 2015-04-18 DIAGNOSIS — I10 Essential (primary) hypertension: Secondary | ICD-10-CM

## 2015-04-18 DIAGNOSIS — IMO0002 Reserved for concepts with insufficient information to code with codable children: Secondary | ICD-10-CM

## 2015-04-18 DIAGNOSIS — I2510 Atherosclerotic heart disease of native coronary artery without angina pectoris: Secondary | ICD-10-CM

## 2015-04-18 DIAGNOSIS — I2583 Coronary atherosclerosis due to lipid rich plaque: Secondary | ICD-10-CM

## 2015-04-18 DIAGNOSIS — Z79899 Other long term (current) drug therapy: Secondary | ICD-10-CM | POA: Diagnosis not present

## 2015-04-18 DIAGNOSIS — E785 Hyperlipidemia, unspecified: Secondary | ICD-10-CM | POA: Diagnosis not present

## 2015-04-18 DIAGNOSIS — I639 Cerebral infarction, unspecified: Secondary | ICD-10-CM

## 2015-04-18 LAB — BASIC METABOLIC PANEL
BUN: 13 mg/dL (ref 6–23)
CO2: 28 mEq/L (ref 19–32)
Calcium: 8.8 mg/dL (ref 8.4–10.5)
Chloride: 104 mEq/L (ref 96–112)
Creat: 0.72 mg/dL (ref 0.50–1.35)
Glucose, Bld: 117 mg/dL — ABNORMAL HIGH (ref 70–99)
Potassium: 4.5 mEq/L (ref 3.5–5.3)
Sodium: 144 mEq/L (ref 135–145)

## 2015-04-18 NOTE — Patient Instructions (Signed)
Your physician recommends that you schedule a follow-up appointment Keep Appointment with Dr Allyson Sabal on June 10th  Your physician recommends that you return for lab work [facility]  Your physician has recommended you make the following change in your medication: Alternate Lasix 40 mg then 80 mg then next day

## 2015-04-18 NOTE — Progress Notes (Signed)
HTN   Cardiology Office Note   Date:  04/18/2015   ID:  Johnathan Keith, DOB 01/20/1961, MRN [id number]  PCP:  Pcp Not In System  Cardiologist:  Dr. Allyson Sabal    Chief Complaint  Patient presents with  . Hospitalization Follow-up    Cath 2 stents placed      History of Present Illness: Johnathan Keith is a 54 y.o. male who presents for hospital follow up for stent to LAD.  Pt had low risk nuc but with continued fatigue and DOE cardiac cath was done finding 60-70% prox LAD and 80-90% mid LAD disease.  Pt had 2 DES stents.    Since discharge he had done better.  He continues with labile HTN.  His meds have been adjusted.  His wound on Lt great toe is improving.  He just had LEA dopplers but results are pending.  BP today is stable.  His lisinopril has been stopped.        Past Medical History  Diagnosis Date  . Hypertension   . High cholesterol   . Diabetic retinopathy   . Peripheral vascular disease     carotid artery disease  . Coronary artery disease   . Kidney stones   . PONV (postoperative nausea and vomiting)     "was told I was confused and combative in recovery from the narcotics w/my carotid OR"  . Type II diabetes mellitus   . Heart attack     "in 1997 was told I had signs of a small heart attack that I didn't know I'd had"  . Deviated septum   . Sleep apnea     "severe; couldn't afford mask" (03/23/2015)  . GERD (gastroesophageal reflux disease)     "occasionally" (03/23/2015)  . Stroke 2014    "on walker for 2 months; left side is weaker and numb since" (03/23/2015)  . History of gout     "when I was younger"  . Chronic neck and back pain   . Migraine     "had my 1st and only in 12/2014" (03/23/2015)  . Headache     "more than 2/wk" (03/23/2015)    Past Surgical History  Procedure Laterality Date  . Hydrocele excision  ~ 2008  . Refractive surgery Bilateral 08/2014-09/2014    Diabetic retinopathy   . Circumcision  1981  . Endarterectomy Right 09/26/2014   Procedure: RIGHT CAROTID ENDARTERECTOMY WITH PATCH ANGIOPLASTY;  Surgeon: Fransisco Hertz, MD;  Location: [facility] OR;  Service: Vascular;  Laterality: Right;  . Coronary angioplasty with stent placement  03/23/2015    "2"  . Eye surgery    . Left heart catheterization with coronary angiogram N/A 03/23/2015    Procedure: LEFT HEART CATHETERIZATION WITH CORONARY ANGIOGRAM;  Surgeon: Runell Gess, MD;  Location: [facility] CATH LAB;  Service: Cardiovascular;  Laterality: N/A;     Current Outpatient Prescriptions  Medication Sig Dispense Refill  . acetaminophen (TYLENOL) 325 MG tablet Take 2 tablets (650 mg total) by mouth every 4 (four) hours as needed for headache or mild pain.    Marland Kitchen aspirin 81 MG chewable tablet Chew 1 tablet (81 mg total) by mouth daily.    . Aspirin-Acetaminophen-Caffeine (EXCEDRIN MIGRAINE PO) Take 1 tablet by mouth daily as needed (for migraine).     Marland Kitchen atorvastatin (LIPITOR) 40 MG tablet Take 40 mg by mouth at bedtime.    . Cholecalciferol (VITAMIN D) 2000 UNITS tablet Take 2,000 Units by mouth daily.    . clopidogrel (PLAVIX)  75 MG tablet Take 1 tablet (75 mg total) by mouth daily with breakfast. 90 tablet 3  . dicyclomine (BENTYL) 10 MG capsule Take 10 mg by mouth 3 (three) times daily before meals.     . diphenoxylate-atropine (LOMOTIL) 2.5-0.025 MG per tablet Take 1 tablet by mouth 3 (three) times daily.     . DUREZOL 0.05 % EMUL     . furosemide (LASIX) 80 MG tablet Take 1 tablet (80 mg total) by mouth daily. 90 tablet 0  . gentamicin (GARAMYCIN) 0.3 % ophthalmic solution     . insulin NPH-regular Human (NOVOLIN 70/30) (70-30) 100 UNIT/ML injection Inject 50 Units into the skin 4 (four) times daily - after meals and at bedtime.     Marland Kitchen ketorolac (ACULAR) 0.4 % SOLN     . metFORMIN (GLUCOPHAGE-XR) 500 MG 24 hr tablet Take 1 tablet (500 mg total) by mouth daily with supper.    . metoprolol tartrate (LOPRESSOR) 25 MG tablet Take 1 tablet (25 mg total) by mouth 2 (two) times daily. 180  tablet 3  . nitroGLYCERIN (NITROSTAT) 0.4 MG SL tablet Place 1 tablet (0.4 mg total) under the tongue every 5 (five) minutes as needed for chest pain. 25 tablet 2  . omeprazole (PRILOSEC) 20 MG capsule Take 20 mg by mouth daily as needed (acid reflux/ heartburn).     Bertram Gala Glycol-Propyl Glycol (SYSTANE OP) Place 1 drop into both eyes 2 (two) times daily.    Marland Kitchen SIMPLY SALINE NA Place 1 spray into both nostrils 2 (two) times daily.    . tamsulosin (FLOMAX) 0.4 MG CAPS capsule Take 1 capsule (0.4 mg total) by mouth daily. 30 capsule 1  . terbinafine (LAMISIL) 250 MG tablet Take 250 mg by mouth daily.     No current facility-administered medications for this visit.    Allergies:   Review of patient's allergies indicates no known allergies.    Social History:  The patient  reports that he has never smoked. He has never used smokeless tobacco. He reports that he does not drink alcohol or use illicit drugs.   Family History:  The patient's family history includes Asthma in his brother; Cancer in his father; Colon cancer in his sister; Diabetes in his brother, daughter, maternal grandfather, and mother; Gout in his brother and maternal uncle; Heart attack in his father; Heart disease in his father and mother; Hyperlipidemia in his brother, mother, and sister; Hypertension in his brother, mother, and sister.    ROS:  General:no colds or fevers, no weight changes Skin:no rashes wound improving on Lt toe HEENT:no blurred vision, no congestion- plan for cataract surgery in near future. CV:see HPI PUL:see HPI GI:no diarrhea constipation or melena, no indigestion GU:no hematuria, no dysuria MS:no joint pain, no claudication Neuro:no syncope, + lightheadedness Endo:+ diabetes, no thyroid disease  Wt Readings from Last 3 Encounters:  04/18/15 223 lb 4.8 oz (101.288 kg)  04/07/15 224 lb (101.606 kg)  03/24/15 223 lb 12.3 oz (101.5 kg)     PHYSICAL EXAM: VS:  BP 114/76 mmHg  Pulse 67  Ht 5'  6" (1.676 m)  Wt 223 lb 4.8 oz (101.288 kg)  BMI 36.06 kg/m2 , BMI Body mass index is 36.06 kg/(m^2). General:Pleasant affect, NAD Skin:Warm and dry, brisk capillary refill HEENT:normocephalic, sclera clear, mucus membranes moist Neck:supple, no JVD, no bruits  Heart:S1S2 RRR without murmur, gallup, rub or click Lungs:clear without rales, rhonchi, or wheezes IEP:PIRJ, non tender, + BS, do not palpate liver spleen  or masses Ext:no lower ext edema today,  2+ radial pulses Neuro:alert and oriented X 3, MAE, follows commands, + facial symmetry    EKG:  EKG is ordered today. The ekg ordered today demonstrates SR normal EKG no changes   Recent Labs: 07/27/2014: TSH 2.860 03/22/2015: ALT 22 03/24/2015: BUN 16; Creatinine 0.78; Hemoglobin 13.0; Platelets 229; Potassium 3.5; Sodium 140    Lipid Panel    Component Value Date/Time   CHOL 132 03/22/2015 1136   CHOL 156 07/27/2014 0843   TRIG 222* 03/22/2015 1136   HDL 34* 03/22/2015 1136   HDL 30* 07/27/2014 0843   CHOLHDL 3.9 03/22/2015 1136   CHOLHDL 5.2* 07/27/2014 0843   VLDL 44* 03/22/2015 1136   LDLCALC 54 03/22/2015 1136   LDLCALC 84 07/27/2014 0843       Other studies Reviewed: Additional studies/ records that were reviewed today include: cath report, EKG, hospital notes.   ASSESSMENT AND PLAN:   1. DOE, was increasing- with low risk nuc study 09/2014- however it did show mild anteroapical and inferobasal hypoperfusion. While this did not meet the level of significance that would have necessitated invasive investigation prior to his surgery it does raise the issue that he may have subclinical myocardial ischemia. With increasing dyspnea concerned it was anginal equivalent.Cardiac cath with 90% mid LAD stenosis undergoing 2 DES stents.  Pt has done well and his symptoms have improved including his edema.  Keep follow up with Dr. Erlene Quan in June.     2.  HTN (hypertension) Blood pressure today is stable, on his list it  has been fairly stable with occ low, lisinopril has been stopped.  With no edema I am decreasing his lasix to 40 mg alternating with 80 mg QOD.  Possible ischemia was increasing edema.  .  3. Carotid artery stenosis with cerebral infarction 2015  Status post successful uncomplicated right carotid endarterectomy performed by Dr. [name] 09/26/14.   4. HLD (hyperlipidemia) History of hyperlipidemia on atorvastatin 40 mg. His recent lipid profile performed 07/27/14 revealed an LDL of 84 and HDL of 30. He does admit to dietary indiscretion and is close to being at goal for secondary prevention. Talked about dietary modification. Will recheck lipids prior to procedure  5. DM 2 not as well controlled- followed by PCP  6. Non healing wound- ingrown toe nail. awaiting results of lower ext art. dopplers   Current medicines are reviewed with the patient today.  The patient Has no concerns regarding medicines.  The following changes have been made:  See above Labs/ tests ordered today include:see above  Disposition:   FU:  see above  Signed, Leone Brand, NP  04/18/2015 11:04 AM    [facility] [address] [place], [place], [place]  [address]/ [address] [place], [place] Phone: [phone]; Fax: [phone]  [phone]

## 2015-04-22 ENCOUNTER — Other Ambulatory Visit: Payer: Self-pay | Admitting: Cardiovascular Disease

## 2015-05-19 ENCOUNTER — Encounter: Payer: Self-pay | Admitting: Cardiovascular Disease

## 2015-05-19 ENCOUNTER — Ambulatory Visit (INDEPENDENT_AMBULATORY_CARE_PROVIDER_SITE_OTHER): Payer: No Typology Code available for payment source | Admitting: Cardiovascular Disease

## 2015-05-19 VITALS — BP 128/80 | HR 69 | Ht 66.0 in | Wt 219.0 lb

## 2015-05-19 DIAGNOSIS — E785 Hyperlipidemia, unspecified: Secondary | ICD-10-CM

## 2015-05-19 DIAGNOSIS — I10 Essential (primary) hypertension: Secondary | ICD-10-CM

## 2015-05-19 DIAGNOSIS — R0609 Other forms of dyspnea: Secondary | ICD-10-CM | POA: Diagnosis not present

## 2015-05-19 DIAGNOSIS — I6521 Occlusion and stenosis of right carotid artery: Secondary | ICD-10-CM | POA: Diagnosis not present

## 2015-05-19 DIAGNOSIS — R931 Abnormal findings on diagnostic imaging of heart and coronary circulation: Secondary | ICD-10-CM

## 2015-05-19 DIAGNOSIS — R0600 Dyspnea, unspecified: Secondary | ICD-10-CM

## 2015-05-19 NOTE — Progress Notes (Signed)
05/19/2015 [name]   Dec 16, 1960  [id number]  Primary Physician Pcp Not In System Primary Cardiologist: Runell Gess MD [name]   HPI: Mr. Schubring is a 54 year old mildly overweight married Caucasian male accompanied by his wife today. I last saw him in the office 10/26/14..   In addition to hypertension, his cardiovascular history is significant for prior TIA subsequent to carotid artery disease. He recently underwent right carotid endarterectomy performed by Dr. Claudie Fisherman on 09/26/2014. There is also question of remote myocardial infarction in the past. He recently underwent evaluation with a 2-D echocardiogram that was performed preoperatively which showed normal LV function as well as a Myoview stress test that had subtle inferior and lateral ischemia. At that time he had no SOB or chest pain and was cleared for surgery. His history is also notable for hyperlipidemia and diabetes. He was last seen by Dr. Allyson Sabal 10/26/2014. During that time, he complained of symptomatic hypotension with systolic blood pressures in the low 90s. Subsequently Dr. Allyson Sabal decided to decrease his lisinopril down from 40 mg to 20 mg daily and now down to 10 mg daily. He is taking a forth of 40 mg table. Since that time he has been seen by our office pharmacist Phillips Hay for labile blood pressures. Though over all BP is improved. He is on most of his meds.   He has been on lasix 80 mg daily for a year for lower ext. Edema. His K+ has been elevated at times and currently he is off the kdur. He has not had recent lipids.  In addition to lower ext edema he has DOE. No chest pain but walking in grocery store and to car he is very SOB. This has progressed since Nuc study. He has several risk factors: DM, hyperlipidemia, vascular disease and family hx CAD. His nuclear study was high risk for anterolateral and inferior ischemia. Based on this, and in light of his increasing dyspnea on  exertion as a potential anginal equivalent, he underwent diagnostic coronary arteriography by myself 03/23/15 revealing proximal and mid LAD stenoses which I intervened on with drug-eluting stents. The circumflex and RCA were free of significant disease as LV function was preserved. Since intervention the symptoms have markedly improved.  Current Outpatient Prescriptions  Medication Sig Dispense Refill  . aspirin 81 MG chewable tablet Chew 1 tablet (81 mg total) by mouth daily.    . Aspirin-Acetaminophen-Caffeine (EXCEDRIN MIGRAINE PO) Take 1 tablet by mouth daily as needed (for migraine).     Marland Kitchen atorvastatin (LIPITOR) 40 MG tablet Take 40 mg by mouth at bedtime.    . Cholecalciferol (VITAMIN D) 2000 UNITS tablet Take 2,000 Units by mouth daily.    . clopidogrel (PLAVIX) 75 MG tablet Take 1 tablet (75 mg total) by mouth daily with breakfast. 90 tablet 3  . dicyclomine (BENTYL) 10 MG capsule Take 10 mg by mouth 3 (three) times daily before meals.     . diphenoxylate-atropine (LOMOTIL) 2.5-0.025 MG per tablet Take 1 tablet by mouth 3 (three) times daily.     . furosemide (LASIX) 80 MG tablet TAKE 1 TABLET ONCE DAILY. 90 tablet 0  . insulin NPH-regular Human (NOVOLIN 70/30) (70-30) 100 UNIT/ML injection Inject 50 Units into the skin 4 (four) times daily - after meals and at bedtime.     Marland Kitchen ketorolac (ACULAR) 0.4 % SOLN     . lipase/protease/amylase (CREON) 36000 UNITS CPEP capsule Take 36,000 Units by mouth 3 (three) times daily before  meals.    . metFORMIN (GLUCOPHAGE-XR) 500 MG 24 hr tablet Take 1 tablet (500 mg total) by mouth daily with supper.    . metoprolol (LOPRESSOR) 50 MG tablet Take 25 mg by mouth 2 (two) times daily.    . nitroGLYCERIN (NITROSTAT) 0.4 MG SL tablet Place 1 tablet (0.4 mg total) under the tongue every 5 (five) minutes as needed for chest pain. 25 tablet 2  . omeprazole (PRILOSEC) 20 MG capsule Take 20 mg by mouth daily as needed (acid reflux/ heartburn).     Bertram Gala  Glycol-Propyl Glycol (SYSTANE OP) Place 1 drop into both eyes 2 (two) times daily.    Marland Kitchen SIMPLY SALINE NA Place 1 spray into both nostrils 2 (two) times daily.    . tamsulosin (FLOMAX) 0.4 MG CAPS capsule Take 1 capsule (0.4 mg total) by mouth daily. 30 capsule 1   No current facility-administered medications for this visit.    No Known Allergies  History   Social History  . Marital Status: Married    Spouse Name: N/A  . Number of Children: 5  . Years of Education: 12th   Occupational History  . Disabled    Social History Main Topics  . Smoking status: Never Smoker   . Smokeless tobacco: Never Used  . Alcohol Use: No  . Drug Use: No  . Sexual Activity:    Partners: Female   Other Topics Concern  . Not on file   Social History Narrative   Patient is married with 5 children.    Patient is right handed.   Patient has hs education.   Patient drinks 4 12 oz can sodas daily.     Review of Systems: General: negative for chills, fever, night sweats or weight changes.  Cardiovascular: negative for chest pain, dyspnea on exertion, edema, orthopnea, palpitations, paroxysmal nocturnal dyspnea or shortness of breath Dermatological: negative for rash Respiratory: negative for cough or wheezing Urologic: negative for hematuria Abdominal: negative for nausea, vomiting, diarrhea, bright red blood per rectum, melena, or hematemesis Neurologic: negative for visual changes, syncope, or dizziness All other systems reviewed and are otherwise negative except as noted above.    Blood pressure 128/80, pulse 69, height  (1.676 m), weight 219 lb (99.338 kg).  General appearance: alert and no distress Neck: no adenopathy, no carotid bruit, no JVD, supple, symmetrical, trachea midline and thyroid not enlarged, symmetric, no tenderness/mass/nodules Lungs: clear to auscultation bilaterally Heart: regular rate and rhythm, S1, S2 normal, no murmur, click, rub or gallop Extremities:  extremities normal, atraumatic, no cyanosis or edema  EKG not performed today  ASSESSMENT AND PLAN:   Hyperlipidemia History of hyperlipidemia on atorvastatin 40 mg a day with recent lipid profile performed 03/22/15 revealed a total cholesterol 132, LDL 54 and HDL 34.  Essential hypertension History of hypertension blood pressure measured to 120/80. He is on metoprolol 50 mg twice a day. He was on lisinopril which was discontinued because of symptomatic hypotension.  Dyspnea on exertion Improved since his recent LAD intervention  Carotid artery stenosis-Rt CEA Oct 2015 Status post right carotid endarterectomy performed by Dr. [name] in October 2015. He remains neurologically symptomatic  Abnormal nuclear cardiac imaging test Status post cardiac catheterization by myself performed 03/23/15 revealing high-grade physiologic significant proximal mid LAD stenosis both of which were stented with drug-eluting stents. His ejection fraction was normal and his circumflex and RCA were free of significant disease. This was done because of progressive dyspnea on exertion and abnormal Myoview  stress test. He feels symptomatically improved on dual antiplatelets  therapy including aspirin and Plavix.      Runell Gess MD FACP,FACC,FAHA, [facility] 05/19/2015 12:11 PM

## 2015-05-19 NOTE — Assessment & Plan Note (Signed)
Improved since his recent LAD intervention

## 2015-05-19 NOTE — Assessment & Plan Note (Signed)
Status post cardiac catheterization by myself performed 03/23/15 revealing high-grade physiologic significant proximal mid LAD stenosis both of which were stented with drug-eluting stents. His ejection fraction was normal and his circumflex and RCA were free of significant disease. This was done because of progressive dyspnea on exertion and abnormal Myoview stress test. He feels symptomatically improved on dual antiplatelets  therapy including aspirin and Plavix.

## 2015-05-19 NOTE — Assessment & Plan Note (Signed)
History of hyperlipidemia on atorvastatin 40 mg a day with recent lipid profile performed 03/22/15 revealed a total cholesterol 132, LDL 54 and HDL 34.

## 2015-05-19 NOTE — Assessment & Plan Note (Signed)
History of hypertension blood pressure measured to 120/80. He is on metoprolol 50 mg twice a day. He was on lisinopril which was discontinued because of symptomatic hypotension.

## 2015-05-19 NOTE — Patient Instructions (Signed)
Dr Berry recommends that you schedule a follow-up appointment in 6 months with an extender.  Dr Berry wants you to follow-up in 12 months. You will receive a reminder letter in the mail two months in advance. If you don't receive a letter, please call our office to schedule the follow-up appointment. 

## 2015-05-19 NOTE — Assessment & Plan Note (Signed)
Status post right carotid endarterectomy performed by Dr. [name] in October 2015. He remains neurologically symptomatic

## 2015-05-24 DIAGNOSIS — D729 Disorder of white blood cells, unspecified: Secondary | ICD-10-CM

## 2015-05-24 DIAGNOSIS — D72820 Lymphocytosis (symptomatic): Secondary | ICD-10-CM

## 2015-05-24 HISTORY — DX: Lymphocytosis (symptomatic): D72.820

## 2015-05-24 HISTORY — DX: Disorder of white blood cells, unspecified: D72.9

## 2015-05-24 NOTE — Addendum Note (Signed)
Addended by: Neta Ehlers on: 05/24/2015 06:06 PM   Modules accepted: Orders

## 2015-05-30 ENCOUNTER — Telehealth: Payer: Self-pay | Admitting: Cardiovascular Disease

## 2015-05-30 NOTE — Telephone Encounter (Signed)
Received Request from [employer] [facility] for records and Attending Physician Statement completed.  Sent to [employer] [facility] [employer] to send letter/package to patient to contact our office regarding signing release and pmt to [employer].  Sent to [employer] on 05/30/15 via [employer] I/O.  lp

## 2015-06-01 ENCOUNTER — Telehealth: Payer: Self-pay | Admitting: Neurology

## 2015-06-01 DIAGNOSIS — M545 Low back pain, unspecified: Secondary | ICD-10-CM

## 2015-06-01 NOTE — Telephone Encounter (Signed)
Wait till Dr. Roda Shutters gets back to decide as it is not an emergency

## 2015-06-01 NOTE — Telephone Encounter (Signed)
Dr Pearlean Brownie,  If you approve of this patient having EMG, NCS done, please place order/referral. If not, please advise me how to proceed. His next appointment with Dr Roda Shutters is 09/12/15.  Thank you, Ambrose Pancoast

## 2015-06-01 NOTE — Telephone Encounter (Signed)
Spoke with patient and informed him that in Dr Warren Danes absence Dr Pearlean Brownie was sent his note. Informed him that Dr Pearlean Brownie would let Dr Roda Shutters make decision as it is not an emergency. This RN then informed him that Dr Roda Shutters returns to office the 2nd week of July, will route his request to Dr Roda Shutters and call him back after Dr Roda Shutters returns and replies. Patient verbalized understanding.

## 2015-06-01 NOTE — Telephone Encounter (Signed)
Patient stated that he spoke with Dr. Roda Shutters regarding having a NCS conducted. They decided that they would talk about it during the next follow up appt but the patient states that he experiencing numbness in his legs, especially his left leg. He states that it is getting worse and would like to know if he could go ahead and have the EMG and NCS study. Also experiencing dizziness and trouble standing. I was going to schedule him but could not find a referral in system. Please call and advise.

## 2015-06-05 ENCOUNTER — Encounter: Payer: No Typology Code available for payment source | Admitting: Neurology

## 2015-06-09 HISTORY — PX: EYE SURGERY: SHX253

## 2015-06-13 NOTE — Telephone Encounter (Signed)
Called pt back at 7pm and he is not available over the phone. I left voice message for him to call back and give [place] his best time and number for me to call him back.   Marvel Plan, MD PhD Stroke Neurology 06/13/2015 7:03 PM

## 2015-06-14 DIAGNOSIS — M5459 Other low back pain: Secondary | ICD-10-CM

## 2015-06-14 DIAGNOSIS — M545 Low back pain, unspecified: Secondary | ICD-10-CM | POA: Insufficient documentation

## 2015-06-14 HISTORY — DX: Low back pain, unspecified: M54.50

## 2015-06-14 HISTORY — DX: Other low back pain: M54.59

## 2015-06-14 NOTE — Telephone Encounter (Signed)
Discussed pt and have ordered EMG/NCS. Pt expressed appreciation and understanding.  Marvel Plan, MD PhD Stroke Neurology 06/14/2015 12:32 PM

## 2015-06-14 NOTE — Telephone Encounter (Signed)
Patient returned call, best number to reach him is [phone] and best time to reach patient is any time today after 12:00 pm.

## 2015-07-12 ENCOUNTER — Ambulatory Visit (INDEPENDENT_AMBULATORY_CARE_PROVIDER_SITE_OTHER): Payer: No Typology Code available for payment source | Admitting: Neurology

## 2015-07-12 ENCOUNTER — Ambulatory Visit (INDEPENDENT_AMBULATORY_CARE_PROVIDER_SITE_OTHER): Payer: Self-pay | Admitting: Neurology

## 2015-07-12 DIAGNOSIS — R531 Weakness: Secondary | ICD-10-CM

## 2015-07-12 DIAGNOSIS — M545 Low back pain, unspecified: Secondary | ICD-10-CM

## 2015-07-12 DIAGNOSIS — R269 Unspecified abnormalities of gait and mobility: Secondary | ICD-10-CM

## 2015-07-12 DIAGNOSIS — R202 Paresthesia of skin: Secondary | ICD-10-CM

## 2015-07-12 DIAGNOSIS — Z0289 Encounter for other administrative examinations: Secondary | ICD-10-CM

## 2015-07-13 NOTE — Procedures (Signed)
   NCS (NERVE CONDUCTION STUDY) WITH EMG (ELECTROMYOGRAPHY) REPORT   STUDY DATE: July 12 2015 PATIENT NAME: Johnathan Keith DOB: 14-Jan-1961 MRN: [id number]    TECHNOLOGIST: Gearldine Shown ELECTROMYOGRAPHER: Levert Feinstein M.D.  CLINICAL INFORMATION:  54 year old gentleman, with history of right internal carotid artery endarterectomy, more than 20 years history of diabetes, complains of chronic low back pain, gradual onset gait difficulty since 2014, bilateral lower extremity give up on him, he also has mild bilateral hand and feet paresthesia  On examination: I do not see any significant bilateral upper lower extremity proximal distal weakness, length dependent sensory changes, hyporeflexia.    FINDINGS: NERVE CONDUCTION STUDY: Bilateral peroneal, sural sensory responses were normal. Bilateral peroneal, tibial motor responses were normal. Bilateral tibial H reflexes were present and symmetric.   Bilateral ulnar sensory and motor responses were normal.  Bilateral median sensory response showed moderately prolonged peak latency, with well-preserved snap amplitude.  Bilateral median motor responses showed moderately prolonged distal latency, moderately decreased C map amplitude moderately prolonged F wave latency, with normal conduction velocity.   NEEDLE ELECTROMYOGRAPHY: Selected needle examination was performed at bilateral lower extremity muscles, bilateral lumbar sacral paraspinal muscles, left upper extremity muscles, left cervical paraspinal muscles, left thoracic paraspinal muscles, left sternocleidomastoid, left genioglossus.  Left tibialis anterior, peroneal longus, tibialis posterior, medial gastrocnemius: Increased insertional activity, 1-2 plus spontaneous activity, enlarged complex motor unit potential, with decreased recruitment patterns.   Left vastus lateralis, biceps femoris short head: Increased insertional activity, no spontaneous activity, enlarge complex motor unit  potential, with decreased recruitment patterns.  Right tibialis anterior, tibialis posterior, medial gastrocnemius: Increased insertional activity, 1 plus spontaneous activity, enlarged complex motor unit potential, with decreased recruitment patterns.  Right vastus lateralis, normal insertion activity, no spontaneous activity, enlarged complex motor unit potential, with mildly decreased recruitment patterns.  There was 1-2 plus spontaneous activity at bilateral lumbar sacral paraspinal muscles, at bilateral L4, L5, S1.  There was also increased insertional activity, 1-2 plus spontaneous activity noticed at left thoracic paraspinal muscles, at left T9, T10, T11.  Left pronator teres, biceps, triceps, deltoid, mild increased insertional activity, no spontaneous activity, mixture of normal, some enlarged motor unit potential, with mildly decreased recruitment patterns.  There was no spontaneous activity at left cervical paraspinal muscles, left C5, 6, 7.  Needle examination of left sternocleidomastoid, left genioglossus was normal,   IMPRESSION:  This is an abnormal study. 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  The presence of active denervation at left thoracic paraspinals could also indicate more widespread central nervous system degenerative process, differentiation diagnosis also includes diabetic thoracic radiculopathies, nutritional deficiencies, inflammatory process.  There was evidence of severe bilateral carpal tunnel syndromes.   INTERPRETING PHYSICIAN:   Levert Feinstein M.D. Ph.D. [facility] [address] [place], [place] [address] [phone]

## 2015-07-14 NOTE — Progress Notes (Signed)
Electrodiagnostic study July 12 2015 This is an abnormal study. 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 The presence of active denervation at left thoracic paraspinals could also indicate more widespread central nervous system degenerative process, differentiation diagnosis also includes diabetic thoracic radiculopathies, nutritional deficiencies, inflammatory process. There was evidence of severe bilateral carpal tunnel syndromes.

## 2015-07-17 ENCOUNTER — Telehealth: Payer: Self-pay | Admitting: Neurology

## 2015-07-17 DIAGNOSIS — G5600 Carpal tunnel syndrome, unspecified upper limb: Secondary | ICD-10-CM | POA: Insufficient documentation

## 2015-07-17 DIAGNOSIS — M545 Low back pain, unspecified: Secondary | ICD-10-CM

## 2015-07-17 DIAGNOSIS — G5603 Carpal tunnel syndrome, bilateral upper limbs: Secondary | ICD-10-CM

## 2015-07-17 HISTORY — DX: Carpal tunnel syndrome, unspecified upper limb: G56.00

## 2015-07-17 MED ORDER — GABAPENTIN 100 MG PO CAPS: 100.0000 mg | ORAL_CAPSULE | Freq: Three times a day (TID) | ORAL | Status: DC

## 2015-07-17 NOTE — Telephone Encounter (Signed)
Patient called and would like to have the result of his Electrodiagnostic test. Please call.

## 2015-07-17 NOTE — Telephone Encounter (Signed)
Called pt this pm and relayed his EEG report to him. His EEG was abnormal with diffuse neuropathy, lumbosacral radiculopathy and bilateral CTS. Pt would like conservative measures first instead of hand surgery. He complains of neck pain and back pain, the latter is more severe than former as well as left hand numbness. Will prescribe neurontin for him. At the meantime, will order PT/OT as well as MRI c-/L- spine. Pt expressed appreciation and understanding.    Orders Placed This Encounter  Procedures  . MR Cervical Spine Wo Contrast    Standing Status: Future     Number of Occurrences:      Standing Expiration Date: 09/17/2016    Order Specific Question:  Reason for Exam (SYMPTOM  OR DIAGNOSIS REQUIRED)    Answer:  EMG showed lumbosarcral radicuolpathy    Order Specific Question:  Preferred imaging location?    Answer:  Internal    Order Specific Question:  Does the patient have a pacemaker or implanted devices?    Answer:  No    Order Specific Question:  What is the patient's sedation requirement?    Answer:  No Sedation  . MR Lumbar Spine Wo Contrast    Standing Status: Future     Number of Occurrences:      Standing Expiration Date: 09/15/2016    Order Specific Question:  Reason for Exam (SYMPTOM  OR DIAGNOSIS REQUIRED)    Answer:  EMG showed lumbosarcral radicuolpathy    Order Specific Question:  Preferred imaging location?    Answer:  Internal    Order Specific Question:  Does the patient have a pacemaker or implanted devices?    Answer:  No    Order Specific Question:  What is the patient's sedation requirement?    Answer:  No Sedation  . Ambulatory referral to Physical Therapy    Referral Priority:  Routine    Referral Type:  Physical Medicine    Referral Reason:  Specialty Services Required    Requested Specialty:  Physical Therapy    Number of Visits Requested:  1  . Ambulatory referral to Occupational Therapy    Referral Priority:  Routine    Referral Type:  Occupational  Therapy    Referral Reason:  Specialty Services Required    Requested Specialty:  Occupational Therapy    Number of Visits Requested:  1   Meds ordered this encounter  Medications  . gabapentin (NEURONTIN) 100 MG capsule    Sig: Take 1 capsule (100 mg total) by mouth 3 (three) times daily.    Dispense:  90 capsule    Refill:  2     Marvel Plan, MD PhD Stroke Neurology 07/17/2015 10:16 PM

## 2015-07-24 ENCOUNTER — Ambulatory Visit
Admission: RE | Admit: 2015-07-24 | Discharge: 2015-07-24 | Disposition: A | Discharge status: Home / Self Care | Payer: No Typology Code available for payment source | Source: Ambulatory Visit | Attending: Neurology | Admitting: Neurology

## 2015-07-24 DIAGNOSIS — M545 Low back pain, unspecified: Secondary | ICD-10-CM

## 2015-07-24 DIAGNOSIS — G5603 Carpal tunnel syndrome, bilateral upper limbs: Secondary | ICD-10-CM

## 2015-07-24 DIAGNOSIS — G5602 Carpal tunnel syndrome, left upper limb: Secondary | ICD-10-CM | POA: Diagnosis not present

## 2015-07-24 DIAGNOSIS — R269 Unspecified abnormalities of gait and mobility: Secondary | ICD-10-CM | POA: Diagnosis not present

## 2015-07-24 DIAGNOSIS — G5601 Carpal tunnel syndrome, right upper limb: Secondary | ICD-10-CM | POA: Diagnosis not present

## 2015-07-28 ENCOUNTER — Telehealth: Payer: Self-pay | Admitting: Neurology

## 2015-07-28 NOTE — Telephone Encounter (Signed)
Discussed with pt regarding his MRI results over the phone. It showed cervical radiculopathy but lack of lumbosacral radiculopathy, however, his EMG/NCS showed lumbosacral radiculopathy. Recommended PT for him and he stated that he will wait to see. His neck pain radiating to shoulders but leg pain not too bad, relief on lying down.   He stated that since on neurontin, his numbness did not getting better but felt imbalance on walking. Ataxia is listed as one adverse reaction of neurontin. Asked pt to stop neurontin and give a try, if imbalance getting better and no change of sensory deficit, will d/c neurontin. Pt expressed appreciation and understanding.  Johnathan Plan, MD PhD Stroke Neurology 07/28/2015 4:36 PM

## 2015-07-28 NOTE — Telephone Encounter (Signed)
Patient returned Dr. Warren Danes call regarding MRI results

## 2015-07-31 ENCOUNTER — Telehealth: Payer: Self-pay | Admitting: Neurology

## 2015-07-31 NOTE — Telephone Encounter (Signed)
Patient called, said Dr Roda Shutters told him he could go off of the gabapentin (NEURONTIN) 100 MG capsule (pt was having side effects with being off balance). Pt feels like "the medication was helping more than he realized and wonders if that particular side effects would build up to a certain point or continue to get worse and worse and worse? If taking the medication means that he needs to walk with a cane?"

## 2015-08-01 NOTE — Telephone Encounter (Signed)
Left vm for patient to return phone call.  

## 2015-08-01 NOTE — Telephone Encounter (Signed)
Dr Roda Shutters talk to patient see note below

## 2015-08-03 NOTE — Telephone Encounter (Signed)
I called the pt on the number on 6:12pm but he was not available. I did not leave VM as the answering machine not working well. Anyway, please let the pt know, generally speaking, the gabapentin side effects usually getting better as he gets used to the medication but I can not guarantee. If he thinks gabapentin helped him, he can take gabapentin 100mg  twice a day instead of 3 times a day before to see if he can get relief from numbness but not much side effect. Once he tolerates the medication, and then can re-try three times a day.   Marvel Plan, MD PhD Stroke Neurology 08/03/2015 6:15 PM

## 2015-08-04 DIAGNOSIS — Z0279 Encounter for issue of other medical certificate: Secondary | ICD-10-CM

## 2015-08-16 ENCOUNTER — Telehealth: Payer: Self-pay | Admitting: Neurology

## 2015-08-16 NOTE — Telephone Encounter (Signed)
Rn left message for patient to call back concerning his gabapentin. Dr Roda Shutters call patient on 08-03-15 but was unable to leave vm.

## 2015-08-16 NOTE — Telephone Encounter (Signed)
Patient called stating the gabapentin (NEURONTIN) 100 MG capsule dizziness has stopped but he still experiencing pain in legs and face. He has worked up to taking 3 at bedtime starting last night. Please call and advise. Patient can be reached at [phone].

## 2015-08-16 NOTE — Telephone Encounter (Signed)
If patient calls back he needs to see his primary about his pain, or if it gets worse go the nearest ED, i have left several messages between for patient.  Rn call patient back and left VM for patient. Rn was calling about his pain in his legs and face.

## 2015-08-17 NOTE — Telephone Encounter (Signed)
Patient returned call 08/17/15 10:37 per Katrina, I advised patient that he would need to see his PCP about his pain, if it gets worse to go to the nearest ED.

## 2015-08-21 ENCOUNTER — Other Ambulatory Visit: Payer: Self-pay | Admitting: Cardiovascular Disease

## 2015-08-21 NOTE — Telephone Encounter (Signed)
Rx request sent to pharmacy.  

## 2015-08-29 ENCOUNTER — Telehealth: Payer: Self-pay | Admitting: Cardiovascular Disease

## 2015-08-29 NOTE — Telephone Encounter (Signed)
Returned call to patient. He states he called yesterday to report BP concerns.  Sunday he had little to eat for breakfast - notes just fruit consumed. By the time he returned home from church he felt lightheaded, "legs were convulsing". He took BP, obtained reading of 72/58 w/ HR 78  He ate some potato salad, salty snacks, notes BP improved to 83/52. Symptoms improved. Pt usually takes metoprolol 25mg  BID - he did not take 2nd dose Sunday or Monday. He notes typically his BP has been running 90s/70s which is good for him - was this range when checked today.Marland Kitchen   However, he was advised by PCP that he would need to replace BP cuff as the one he has is old and unreliable.  Pt requests med clarification, BP cuff Rx so that insurance will pay for it.  Routed to [place] to advise.

## 2015-08-29 NOTE — Telephone Encounter (Signed)
Pt called wanting some assistance with his medications. He says that his BP has been lower than normal and would like to know what to do abput adjusting his meds. Please f/u with him  Thanks

## 2015-08-30 NOTE — Telephone Encounter (Signed)
Called pharmacy, BP cuff ordered, patient notified via VM.

## 2015-08-30 NOTE — Telephone Encounter (Signed)
Spoke with patient, has noticed BP dropping to below 90/70 multiple times in past week.  Checked blood sugar during one episode, it was 280.  He ate some salty chips and drank juice and noted BP to increase some.  Patient also re-started tamsulosin about 3 weeks ago.  Suspect that he may be having hypotension from tamsulosin and recommended he take at bedtime.  The Marcelline Deist can rarely cause orthostatic hypotension as well.  It could be the combination or either that is causing his low pressures.  In addition to moving tamsulosin to bedtime, I asked him to cut metoprolol from 25 mg bid to 12.5 mg bid.  He is to call back in a week and let us know if there is any improvement.  May have to defer to urology or endocrinology about other medications.  Will order new BP cuff to pharmacy.  Pt wife has flexible spending account, needs it to be on prescription.

## 2015-09-12 ENCOUNTER — Ambulatory Visit (INDEPENDENT_AMBULATORY_CARE_PROVIDER_SITE_OTHER): Payer: No Typology Code available for payment source | Admitting: Neurology

## 2015-09-12 ENCOUNTER — Encounter: Payer: Self-pay | Admitting: Neurology

## 2015-09-12 VITALS — BP 134/86 | HR 86 | Ht 66.0 in | Wt 205.0 lb

## 2015-09-12 DIAGNOSIS — I6302 Cerebral infarction due to thrombosis of basilar artery: Secondary | ICD-10-CM | POA: Diagnosis not present

## 2015-09-12 DIAGNOSIS — I10 Essential (primary) hypertension: Secondary | ICD-10-CM

## 2015-09-12 DIAGNOSIS — E785 Hyperlipidemia, unspecified: Secondary | ICD-10-CM | POA: Diagnosis not present

## 2015-09-12 DIAGNOSIS — M545 Low back pain, unspecified: Secondary | ICD-10-CM

## 2015-09-12 DIAGNOSIS — G5603 Carpal tunnel syndrome, bilateral upper limbs: Secondary | ICD-10-CM

## 2015-09-12 DIAGNOSIS — I6521 Occlusion and stenosis of right carotid artery: Secondary | ICD-10-CM | POA: Diagnosis not present

## 2015-09-12 NOTE — Patient Instructions (Signed)
-   continue ASA and plavix and lipitor for stroke prevention - continue neurontin for neuropathic pain. - check BP and glucose at home - follow up with orthopedics for joint pain and bilateral carpel tunnel syndrome - follow up with PT/OT for joint pain, neck pain and carpel tunnel syndrome and ask for wrist braces at night - Follow up with your primary care physician for stroke risk factor modification. Recommend maintain blood pressure goal <130/80, diabetes with hemoglobin A1c goal below 6.5% and lipids with LDL cholesterol goal below 70 mg/dL.  - follow up with Dr. [name] and Dr. Gery Pray as scheduled - follow up in 6 months

## 2015-09-13 DIAGNOSIS — I6529 Occlusion and stenosis of unspecified carotid artery: Secondary | ICD-10-CM | POA: Insufficient documentation

## 2015-09-13 DIAGNOSIS — I6302 Cerebral infarction due to thrombosis of basilar artery: Secondary | ICD-10-CM

## 2015-09-13 DIAGNOSIS — M545 Low back pain, unspecified: Secondary | ICD-10-CM | POA: Insufficient documentation

## 2015-09-13 HISTORY — DX: Low back pain, unspecified: M54.50

## 2015-09-13 HISTORY — DX: Cerebral infarction due to thrombosis of basilar artery: I63.02

## 2015-09-13 NOTE — Progress Notes (Signed)
STROKE NEUROLOGY FOLLOW UP NOTE  NAME: Johnathan Keith DOB: 06/13/1961  REASON FOR VISIT: stroke follow up HISTORY FROM: pt and chart  Today we had the pleasure of seeing Johnathan Keith in follow-up at our Neurology Clinic. Pt was accompanied by wife.   History Summary Johnathan Keith presents today as clinic follow up of stroke and right carotid stenosis > 70% in 2014. He had left hemiparesis in 2014 but CT and MRI did not show acute stroke, however, by my read, it is questionable a small DWI change at right pons without corresponding ADC attenuation. He has multiple stroke risk factors including HTN, DM, HLD, and right carotid stenosis.  He is on ASA and lipitor now for stroke prevention.  08/04/14 follow up - During the interval time, the patient has been doing fine without changes. He has seen his PCP and made changes for his DM regimen since his A1c was very high.    His right carotid stenosis > 70% a year ago without intervention, at that time his ICA/CCA ratio was 3.19, as compared to his left ICA/CCA ratio 1.02.  During the interval, we repeated CUS which showed right proximal/mid ICA >70% stenosis, the right ICA/CCA ratio 7.06 and left ICA/CCA ratio 0.95. The right ICA stenosis progressed from one year ago. He also complains that he has finger and toes tingling numbness as well as distal extremities cold with purple color from time to time.   11/09/14 follow up - he has followed up with Dr. [name] and had right CEA done on 09/26/14. Since then, he has intermittent chocking on eating solid food or drinking liquid. He is going to see GI next week. He also followed up with cardiology for low BP and his lisinopril decreased from 40mg  daily to 20mg  daily. He stated that his latest A1C check was 9.9 down from previous 12.3. His BP was well controlled, today in clinic 121/72.  He stated that he has migraine hx and could happen once a week, if not taking medications it may last 2 days. But if takes  Excedrin early in the course, it may last only 1hr after lying down and sleep. He continued to complains of tingling bilateral fingertips and toes. Denies smoking.    03/13/15 follow up - he was doing well. He stated that he can not stand too long otherwise he will have LBP, leg pain and neck pain and he has to lie down. He still has leg numbness and tingling. His sugar not in good control and around 200s due to insurance not covering new generation of DM meds which keeps his glucose at 150s. Now he has to go back to metformin and insulin. His BP better controlled, today 133/75. His right neck wound healed well and he is going to see Dr. [name] in 3 weeks and will do CUS again there. His swallow much better and only occasional chock with certain food.   Interval History During the interval time, he is doing the same. He had EMG/NCS with Dr. 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 The presence of active denervation at left thoracic paraspinals could also indicate more widespread central nervous system degenerative process, differentiation diagnosis also includes diabetic thoracic radiculopathies, nutritional deficiencies, inflammatory process. There was evidence of severe bilateral carpal tunnel syndromes." MRI lumbar and cervical spine done showed cervical radiculopathy but lack of lumbosacral radiculopathy. Recommended PT and prescribed gabapentin to him.  He stated that gabapentin helped him with pain but not get the numbness tingling away. He had right shoulder injections for shoulder pain and currently following with ortho to get knee injections too. She is yet to see ortho for carpel tunnel syndrome.    REVIEW OF SYSTEMS: Full 14 system review of systems performed and notable only for those listed below and in HPI above, all others are negative:  Constitutional:    Cardiovascular:   Ear/Nose/Throat:  Skin: N/A  Eyes:   Respiratory: SOB Gastroitestinal:  Genitourinary: N/A Hematology/Lymphatic: N/A  Endocrine: excessive thirst  Musculoskeletal: joint pain, back pain, walking difficulty, aching muscles  Allergy/Immunology: N/A  Neurological: Headache, numbness Psychiatric:   The following represents the patient's updated allergies and side effects list: No Known Allergies  The neurologically relevant items on the patient's problem list were reviewed on today's visit.  Neurologic Examination  A problem focused neurological exam (12 or more points of the single system neurologic examination, vital signs counts as 1 point, cranial nerves count for 8 points) was performed.  Blood pressure 134/86, pulse 86, height 5\' 6"  (1.676 m), weight 205 lb (92.987 kg).  General - Well nourished, well developed, in no apparent distress.  Ophthalmologic - Sharp disc margins OU.  Cardiovascular - Regular rate and rhythm with no murmur.   Neck - supple, no nuchal rigidity. Carotid no bruits. Mental Status -  Level of arousal and orientation to time, place, and person were intact.  Language including expression, naming, repetition, comprehension was assessed and found intact.  Attention span and concentration were normal.  Recent and remote memory were intact.  Fund of Knowledge was assessed and was intact.  Cranial Nerves II - XII -  II - Vision intact OU.  III, IV, VI - Extraocular movements intact.  V - Facial sensation decreased on the left.  VII - Facial movement intact bilaterally.  VIII - Hearing & vestibular intact bilaterally.  X - Palate elevates symmetrically.  XI - Chin turning & shoulder shrug intact bilaterally.  XII - Tongue protrusion intact.  Motor Strength - The patient's strength was normal in all extremities and pronator drift was absent. Bulk was normal and fasciculations were absent.  Motor Tone - Muscle tone was assessed at the  neck and appendages and was normal.  Reflexes - The patient's reflexes were normal in all extremities and he had no pathological reflexes.  Sensory - Light touch, temperature/pinprick were assessed and were decreased on left UE.  Coordination - The patient had normal movements in the hands and feet with no ataxia or dysmetria. Tremor was absent.  Gait and Station - broad based gait.  Images and labs:  I have personally reviewed the radiological images below and agree with the radiology interpretations.  CTA neck - Atherosclerotic disease in the right carotid bifurcation. 15 mm above the bifurcation, focal severe stenosis measuring 75% diameter stenosis Mild atherosclerotic disease left carotid bifurcation without significant stenosis Both vertebral arteries widely patent.  CUS - 80-99% right ICA stenosis.  2D echo - - Left ventricle: The cavity size was normal. Wall thickness was increased in a pattern of mild LVH. Systolic function was normal. The estimated ejection fraction was in the range of 55% to 60%. Wall motion was normal; there were no regional wall motion abnormalities. Doppler parameters are consistent with abnormal left ventricular relaxation (grade 1 diastolic dysfunction). The E/e&' ratio is >15, suggesting elevated LV filling pressure. - Aortic valve: Sclerosis without stenosis. There was trace to  mild regurgitation. - Left atrium: LA volume/ BSA = 20.7 ml/m2. The atrium was normal in size. - Tricuspid valve: There was trivial regurgitation. - Pulmonary arteries: PA peak pressure: 24 mm Hg (S). Impressions: - LVEF 55-60%, mild LVH, normal wall motion, diastolic dysfunction, elevated LV filling pressure, normal LA size, aortic valve sclerosis with trace to mild AI.  MRI C- spine: 1. Left paramedian disc herniation superimposed on broader disc protrusion at C6-C7 causing severe left neural foraminal narrowing that could lead to left C7 nerve root  compression. 2. Small right paramedian disc protrusion at C7-T1 causing mild to moderate right foraminal narrowing with no nerve root impingement noted. 3. Milder degenerative changes at C3-C4 and C5-C6 do not lead to any foraminal narrowing or nerve root impingement.  MRI L spine: This is a borderline normal age-appropriate MRI showing minimal degenerative changes at T12-L1 and L5-S1 that did not lead to any nerve root impingement.   EMG - This is an abnormal study. 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 The presence of active denervation at left thoracic paraspinals could also indicate more widespread central nervous system degenerative process, differentiation diagnosis also includes diabetic thoracic radiculopathies, nutritional deficiencies, inflammatory process. There was evidence of severe bilateral carpal tunnel syndromes.   Component     Latest Ref Rng 07/27/2014  Cholesterol, Total     100 - 199 mg/dL 161  Triglycerides     0 - 149 mg/dL 096 (H)  HDL     >04 mg/dL 30 (L)  VLDL Cholesterol Cal     5 - 40 mg/dL 42 (H)  LDL (calc)     0 - 99 mg/dL 84  Total CHOL/HDL Ratio     0.0 - 5.0 ratio units 5.2 (H)  TSH     0.450 - 4.500 uIU/mL 2.860  Free T4     0.82 - 1.77 ng/dL 5.40  Hgb J8J MFr Bld     4.8 - 5.6 % 12.3 (H)  Est. average glucose Bld gHb Est-mCnc      306    Assessment: As you may recall, he is a 54 y.o. Caucasian male with PMH of HTN, HLD, DM and right ICA stenosis followed up in clinic today for stroke and right ICA stenosis. He had left hemiparesis in 2014 but CT and MRI did not show acute stroke, however, by my read, it is questionable a small DWI change at right pons without corresponding ADC attenuation. Regardless, he has multiple stroke risk factors including HTN, DM, HLD, and right carotid stenosis. He is on ASA,  plavix and lipitor now for stroke and cardiac prevention. He also has right carotid stenosis and had right CEA by Dr. [name]. EMG and MRI evaluation showed possible lumbar and cervical radiculopathy, on PT/OT. He also has severe carpel tunnel syndrome and following with ortho. Recommend wrist brace.    Plan:  - continue ASA and plavix and lipitor for stroke and cardiac prevention - continue neurontin for neuropathic pain. - check BP and glucose at home - follow up with orthopedics for joint pain and bilateral carpel tunnel syndrome - follow up with PT/OT for joint pain, neck pain and carpel tunnel syndrome and recommend wrist braces at night - Follow up with your primary care physician for stroke risk factor modification. Recommend maintain blood pressure goal <130/80, diabetes with hemoglobin A1c goal below 6.5% and lipids with LDL cholesterol goal below 70 mg/dL.  -  follow up with Dr. [name] and Dr. Gery Pray as scheduled - RTC in 6 months  No orders of the defined types were placed in this encounter.    Patient Instructions  - continue ASA and plavix and lipitor for stroke prevention - continue neurontin for neuropathic pain. - check BP and glucose at home - follow up with orthopedics for joint pain and bilateral carpel tunnel syndrome - follow up with PT/OT for joint pain, neck pain and carpel tunnel syndrome and ask for wrist braces at night - Follow up with your primary care physician for stroke risk factor modification. Recommend maintain blood pressure goal <130/80, diabetes with hemoglobin A1c goal below 6.5% and lipids with LDL cholesterol goal below 70 mg/dL.  - follow up with Dr. [name] and Dr. Gery Pray as scheduled - follow up in 6 months    Marvel Plan, MD PhD [facility] [address], [address] [place], [place] [address] [phone]

## 2015-09-14 ENCOUNTER — Telehealth: Payer: Self-pay | Admitting: Cardiovascular Disease

## 2015-09-14 NOTE — Telephone Encounter (Signed)
Faxed Cardiac Rehab Referral Form signed by Dr Erlene Quan 09/13/15 to [facility].  Faxed all records requested by [facility].  Faxed on 09/14/15. lp

## 2015-10-11 DIAGNOSIS — Z79899 Other long term (current) drug therapy: Secondary | ICD-10-CM | POA: Diagnosis not present

## 2015-10-11 DIAGNOSIS — Z9861 Coronary angioplasty status: Secondary | ICD-10-CM | POA: Diagnosis not present

## 2015-10-11 DIAGNOSIS — Z7982 Long term (current) use of aspirin: Secondary | ICD-10-CM | POA: Diagnosis not present

## 2015-10-11 DIAGNOSIS — E119 Type 2 diabetes mellitus without complications: Secondary | ICD-10-CM | POA: Diagnosis not present

## 2015-10-11 DIAGNOSIS — I252 Old myocardial infarction: Secondary | ICD-10-CM | POA: Diagnosis not present

## 2015-10-11 DIAGNOSIS — Z794 Long term (current) use of insulin: Secondary | ICD-10-CM | POA: Diagnosis not present

## 2015-10-11 DIAGNOSIS — E785 Hyperlipidemia, unspecified: Secondary | ICD-10-CM | POA: Diagnosis not present

## 2015-10-13 ENCOUNTER — Ambulatory Visit: Payer: No Typology Code available for payment source | Admitting: Vascular Surgery

## 2015-10-13 ENCOUNTER — Encounter (HOSPITAL_COMMUNITY): Payer: No Typology Code available for payment source

## 2015-10-13 DIAGNOSIS — E119 Type 2 diabetes mellitus without complications: Secondary | ICD-10-CM | POA: Diagnosis not present

## 2015-10-13 DIAGNOSIS — I252 Old myocardial infarction: Secondary | ICD-10-CM | POA: Diagnosis not present

## 2015-10-13 DIAGNOSIS — Z9861 Coronary angioplasty status: Secondary | ICD-10-CM | POA: Diagnosis not present

## 2015-10-13 DIAGNOSIS — Z79899 Other long term (current) drug therapy: Secondary | ICD-10-CM | POA: Diagnosis not present

## 2015-10-13 DIAGNOSIS — Z7982 Long term (current) use of aspirin: Secondary | ICD-10-CM | POA: Diagnosis not present

## 2015-10-13 DIAGNOSIS — E785 Hyperlipidemia, unspecified: Secondary | ICD-10-CM | POA: Diagnosis not present

## 2015-10-16 DIAGNOSIS — Z9861 Coronary angioplasty status: Secondary | ICD-10-CM | POA: Diagnosis not present

## 2015-10-16 DIAGNOSIS — E119 Type 2 diabetes mellitus without complications: Secondary | ICD-10-CM | POA: Diagnosis not present

## 2015-10-16 DIAGNOSIS — I252 Old myocardial infarction: Secondary | ICD-10-CM | POA: Diagnosis not present

## 2015-10-16 DIAGNOSIS — Z7982 Long term (current) use of aspirin: Secondary | ICD-10-CM | POA: Diagnosis not present

## 2015-10-16 DIAGNOSIS — Z79899 Other long term (current) drug therapy: Secondary | ICD-10-CM | POA: Diagnosis not present

## 2015-10-16 DIAGNOSIS — E785 Hyperlipidemia, unspecified: Secondary | ICD-10-CM | POA: Diagnosis not present

## 2015-10-18 DIAGNOSIS — I252 Old myocardial infarction: Secondary | ICD-10-CM | POA: Diagnosis not present

## 2015-10-18 DIAGNOSIS — Z7982 Long term (current) use of aspirin: Secondary | ICD-10-CM | POA: Diagnosis not present

## 2015-10-18 DIAGNOSIS — E119 Type 2 diabetes mellitus without complications: Secondary | ICD-10-CM | POA: Diagnosis not present

## 2015-10-18 DIAGNOSIS — Z9861 Coronary angioplasty status: Secondary | ICD-10-CM | POA: Diagnosis not present

## 2015-10-18 DIAGNOSIS — Z79899 Other long term (current) drug therapy: Secondary | ICD-10-CM | POA: Diagnosis not present

## 2015-10-18 DIAGNOSIS — E785 Hyperlipidemia, unspecified: Secondary | ICD-10-CM | POA: Diagnosis not present

## 2015-10-20 DIAGNOSIS — M25361 Other instability, right knee: Secondary | ICD-10-CM | POA: Diagnosis not present

## 2015-10-20 DIAGNOSIS — I252 Old myocardial infarction: Secondary | ICD-10-CM | POA: Diagnosis not present

## 2015-10-20 DIAGNOSIS — Z7982 Long term (current) use of aspirin: Secondary | ICD-10-CM | POA: Diagnosis not present

## 2015-10-20 DIAGNOSIS — Z9861 Coronary angioplasty status: Secondary | ICD-10-CM | POA: Diagnosis not present

## 2015-10-20 DIAGNOSIS — Z79899 Other long term (current) drug therapy: Secondary | ICD-10-CM | POA: Diagnosis not present

## 2015-10-20 DIAGNOSIS — E119 Type 2 diabetes mellitus without complications: Secondary | ICD-10-CM | POA: Diagnosis not present

## 2015-10-20 DIAGNOSIS — E785 Hyperlipidemia, unspecified: Secondary | ICD-10-CM | POA: Diagnosis not present

## 2015-10-20 DIAGNOSIS — M25362 Other instability, left knee: Secondary | ICD-10-CM | POA: Diagnosis not present

## 2015-10-23 DIAGNOSIS — Z9861 Coronary angioplasty status: Secondary | ICD-10-CM | POA: Diagnosis not present

## 2015-10-23 DIAGNOSIS — Z79899 Other long term (current) drug therapy: Secondary | ICD-10-CM | POA: Diagnosis not present

## 2015-10-23 DIAGNOSIS — E785 Hyperlipidemia, unspecified: Secondary | ICD-10-CM | POA: Diagnosis not present

## 2015-10-23 DIAGNOSIS — I252 Old myocardial infarction: Secondary | ICD-10-CM | POA: Diagnosis not present

## 2015-10-23 DIAGNOSIS — E119 Type 2 diabetes mellitus without complications: Secondary | ICD-10-CM | POA: Diagnosis not present

## 2015-10-23 DIAGNOSIS — Z7982 Long term (current) use of aspirin: Secondary | ICD-10-CM | POA: Diagnosis not present

## 2015-10-24 ENCOUNTER — Encounter: Payer: Self-pay | Admitting: Vascular Surgery

## 2015-10-25 ENCOUNTER — Other Ambulatory Visit: Payer: Self-pay | Admitting: Neurology

## 2015-10-25 DIAGNOSIS — Z7982 Long term (current) use of aspirin: Secondary | ICD-10-CM | POA: Diagnosis not present

## 2015-10-25 DIAGNOSIS — Z9861 Coronary angioplasty status: Secondary | ICD-10-CM | POA: Diagnosis not present

## 2015-10-25 DIAGNOSIS — E785 Hyperlipidemia, unspecified: Secondary | ICD-10-CM | POA: Diagnosis not present

## 2015-10-25 DIAGNOSIS — Z79899 Other long term (current) drug therapy: Secondary | ICD-10-CM | POA: Diagnosis not present

## 2015-10-25 DIAGNOSIS — I252 Old myocardial infarction: Secondary | ICD-10-CM | POA: Diagnosis not present

## 2015-10-25 DIAGNOSIS — E119 Type 2 diabetes mellitus without complications: Secondary | ICD-10-CM | POA: Diagnosis not present

## 2015-10-27 ENCOUNTER — Encounter: Payer: Self-pay | Admitting: Family

## 2015-10-27 ENCOUNTER — Ambulatory Visit (HOSPITAL_COMMUNITY)
Admission: RE | Admit: 2015-10-27 | Discharge: 2015-10-27 | Disposition: A | Discharge status: Home / Self Care | Payer: Medicare Other | Source: Ambulatory Visit | Attending: Vascular Surgery | Admitting: Vascular Surgery

## 2015-10-27 ENCOUNTER — Ambulatory Visit (INDEPENDENT_AMBULATORY_CARE_PROVIDER_SITE_OTHER): Payer: Medicare Other | Admitting: Family

## 2015-10-27 VITALS — BP 121/78 | HR 79 | Temp 97.3°F | Resp 16 | Ht 66.0 in | Wt 210.0 lb

## 2015-10-27 DIAGNOSIS — I252 Old myocardial infarction: Secondary | ICD-10-CM | POA: Diagnosis not present

## 2015-10-27 DIAGNOSIS — I10 Essential (primary) hypertension: Secondary | ICD-10-CM | POA: Diagnosis not present

## 2015-10-27 DIAGNOSIS — E119 Type 2 diabetes mellitus without complications: Secondary | ICD-10-CM | POA: Diagnosis not present

## 2015-10-27 DIAGNOSIS — Z7982 Long term (current) use of aspirin: Secondary | ICD-10-CM | POA: Diagnosis not present

## 2015-10-27 DIAGNOSIS — E785 Hyperlipidemia, unspecified: Secondary | ICD-10-CM | POA: Diagnosis not present

## 2015-10-27 DIAGNOSIS — I6523 Occlusion and stenosis of bilateral carotid arteries: Secondary | ICD-10-CM

## 2015-10-27 DIAGNOSIS — Z48812 Encounter for surgical aftercare following surgery on the circulatory system: Secondary | ICD-10-CM

## 2015-10-27 DIAGNOSIS — Z9889 Other specified postprocedural states: Secondary | ICD-10-CM

## 2015-10-27 DIAGNOSIS — Z79899 Other long term (current) drug therapy: Secondary | ICD-10-CM | POA: Diagnosis not present

## 2015-10-27 DIAGNOSIS — Z9861 Coronary angioplasty status: Secondary | ICD-10-CM | POA: Diagnosis not present

## 2015-10-27 NOTE — Progress Notes (Signed)
Established Carotid Patient   History of Present Illness  Johnathan Keith is a 54 y.o. male patient of Dr. [name] who is s/p R CEA on 09/26/14. Patient has known history of TIA or stroke symptom in March 2014. The patient has never had amaurosis fugax or monocular blindness. The patient has had left sided weakness. The patient has never had receptive or expressive aphasia.   The patient reports New Medical or Surgical History: cardiac cath with 2 stents placed in April 2016.  He needs surgery for deviated septum due to oxygen desaturation when he nose breaths.  He needs bilateral carpal tunnel repair, left worse than right. He has had constipation alternating with incontinence diarrhea since 2014, states he cannot have a diagnostic endoscope until he finishes a year on Plavix which will be April of 2017.  He has pulsatile tinnitus in his left ear for about a year, states he will discuss this with the ENT specialist he is already seeing re his deviated septum.  Pt Diabetic: yes, recently working with an endocrinologist to improve his glycemic control, Dr. Allena Katz with [facility] Pt smoker: non-smoker, second hand exposure from his father as a child  Pt meds include: Statin : yes ASA: yes Other anticoagulants/antiplatelets: Plavix   Past Medical History  Diagnosis Date  . Hypertension   . High cholesterol   . Diabetic retinopathy (HCC)   . Peripheral vascular disease (HCC)     carotid artery disease  . Coronary artery disease     drug-eluting stents placed in proximal and mid LAD  . Kidney stones   . PONV (postoperative nausea and vomiting)     "was told I was confused and combative in recovery from the narcotics w/my carotid OR"  . Type II diabetes mellitus (HCC)   . Heart attack (HCC)     "in 1997 was told I had signs of a small heart attack that I didn't know I'd had"  . Deviated septum   . Sleep apnea     "severe; couldn't afford mask" (03/23/2015)  . GERD  (gastroesophageal reflux disease)     "occasionally" (03/23/2015)  . Stroke [facility]) 2014    "on walker for 2 months; left side is weaker and numb since" (03/23/2015)  . History of gout     "when I was younger"  . Chronic neck and back pain   . Migraine     "had my 1st and only in 12/2014" (03/23/2015)  . Headache     "more than 2/wk" (03/23/2015)    Social History Social History  Substance Use Topics  . Smoking status: Never Smoker   . Smokeless tobacco: Never Used  . Alcohol Use: No    Family History Family History  Problem Relation Age of Onset  . Heart disease Mother   . Diabetes Mother   . Hyperlipidemia Mother   . Hypertension Mother   . Cancer Father     Lung Cancer   . Heart disease Father   . Heart attack Father   . Colon cancer Sister   . Hyperlipidemia Sister   . Hypertension Sister   . Gout Maternal Uncle   . Diabetes Maternal Grandfather   . Gout Brother   . Asthma Brother   . Hyperlipidemia Brother   . Hypertension Brother   . Diabetes Brother   . Cancer Brother     colon  . Diabetes Daughter   . Stroke Paternal Grandfather     Surgical History Past Surgical History  Procedure  Laterality Date  . Hydrocele excision  ~ 2008  . Refractive surgery Bilateral 08/2014-09/2014    Diabetic retinopathy   . Circumcision  1981  . Endarterectomy Right 09/26/2014    Procedure: RIGHT CAROTID ENDARTERECTOMY WITH PATCH ANGIOPLASTY;  Surgeon: Fransisco Hertz, MD;  Location: [facility] OR;  Service: Vascular;  Laterality: Right;  . Coronary angioplasty with stent placement  03/23/2015    "2"  . Left heart catheterization with coronary angiogram N/A 03/23/2015    Procedure: LEFT HEART CATHETERIZATION WITH CORONARY ANGIOGRAM;  Surgeon: Runell Gess, MD;  Location: [facility] CATH LAB;  Service: Cardiovascular;  Laterality: N/A;  . Eye surgery Right May 2016    Cataract  . Eye surgery Left July 2016    Cataract    No Known Allergies  Current Outpatient Prescriptions  Medication  Sig Dispense Refill  . aspirin 81 MG chewable tablet Chew 1 tablet (81 mg total) by mouth daily.    . Aspirin-Acetaminophen-Caffeine (EXCEDRIN MIGRAINE PO) Take 1 tablet by mouth daily as needed (for migraine).     Marland Kitchen atorvastatin (LIPITOR) 40 MG tablet Take 40 mg by mouth at bedtime.    . Cholecalciferol (VITAMIN D) 2000 UNITS tablet Take 2,000 Units by mouth daily.    . clopidogrel (PLAVIX) 75 MG tablet Take 1 tablet (75 mg total) by mouth daily with breakfast. 90 tablet 3  . dapagliflozin propanediol (FARXIGA) 10 MG TABS tablet Take 10 mg by mouth daily.    Marland Kitchen dicyclomine (BENTYL) 10 MG capsule Take 10 mg by mouth 3 (three) times daily before meals.     . diphenoxylate-atropine (LOMOTIL) 2.5-0.025 MG per tablet Take 1 tablet by mouth 3 (three) times daily.     Marland Kitchen FREESTYLE LITE test strip     . furosemide (LASIX) 80 MG tablet TAKE 1 TABLET ONCE DAILY. (Patient taking differently: TAKE 1 TABLET ONCE DAILY. every other day, 40 other days take 80mg ) 90 tablet 2  . gabapentin (NEURONTIN) 100 MG capsule TAKE 1 CAPSULE THREE TIMES DAILY. 90 capsule 6  . Insulin Detemir (LEVEMIR FLEXTOUCH) 100 UNIT/ML Pen Inject 50 Units into the skin 2 (two) times daily.    Marland Kitchen ketorolac (ACULAR) 0.4 % SOLN     . lipase/protease/amylase (CREON) 36000 UNITS CPEP capsule Take 36,000 Units by mouth 3 (three) times daily before meals.    Marland Kitchen LOPERAMIDE HCL PO Take 2 mg by mouth.     . metFORMIN (GLUCOPHAGE-XR) 500 MG 24 hr tablet Take 1 tablet (500 mg total) by mouth daily with supper.    . metoprolol (LOPRESSOR) 50 MG tablet Take 12.5 mg by mouth 2 (two) times daily.     . nitroGLYCERIN (NITROSTAT) 0.4 MG SL tablet Place 1 tablet (0.4 mg total) under the tongue every 5 (five) minutes as needed for chest pain. 25 tablet 2  . omeprazole (PRILOSEC) 20 MG capsule Take 20 mg by mouth daily as needed (acid reflux/ heartburn).     Bertram Gala Glycol-Propyl Glycol (SYSTANE OP) Place 1 drop into both eyes 2 (two) times daily.    Marland Kitchen  SIMPLY SALINE NA Place 1 spray into both nostrils 2 (two) times daily.    . tamsulosin (FLOMAX) 0.4 MG CAPS capsule Take 1 capsule (0.4 mg total) by mouth daily. 30 capsule 1   No current facility-administered medications for this visit.    Review of Systems : See HPI for pertinent positives and negatives.  Physical Examination  Filed Vitals:   10/27/15 1131 10/27/15 1135  BP: 127/83  121/78  Pulse: 80 79  Temp:  97.3 F (36.3 C)  TempSrc:  Oral  Resp:  16  Height:  5\' 6"  (1.676 m)  Weight:  210 lb (95.255 kg)  SpO2:  98%   Body mass index is 33.91 kg/(m^2).  General: WDWN male in NAD GAIT: using quad cane Eyes: PERRLA Pulmonary:  Non-labored, CTAB, no rales, no rhonchi, or wheezing.  Cardiac: regular rhythm, no detected murmur.  VASCULAR EXAM Carotid Bruits Right Left   Negative Negative    Aorta is not palpable. Radial pulses are 1+ palpable and equal.                                                                                                                            LE Pulses Right Left       POPLITEAL  not palpable   not palpable       POSTERIOR TIBIAL  not palpable   not palpable        DORSALIS PEDIS      ANTERIOR TIBIAL 2+ palpable  2+ palpable     Gastrointestinal: soft, nontender, BS WNL, no r/g, no palpable masses.  Musculoskeletal: no muscle atrophy/wasting. M/S 5/5 throughout except 4/5 in left leg, extremities without ischemic changes.  Neurologic: A&O X 3; Appropriate Affect, Speech is normal CN 2-12 intact, pain and light touch intact in extremities, Motor exam as listed above.   Non-Invasive Vascular Imaging CAROTID DUPLEX 10/27/2015   CEREBROVASCULAR DUPLEX EVALUATION    INDICATION: Carotid artery disease    PREVIOUS INTERVENTION(S): Right carotid endarterectomy 09/26/2014    DUPLEX EXAM: Carotid duplex    RIGHT  LEFT  Peak Systolic Velocities (cm/s) End Diastolic Velocities (cm/s) Plaque LOCATION Peak Systolic Velocities  (cm/s) End Diastolic Velocities (cm/s) Plaque  96 13 - CCA PROXIMAL 101 24 -  84 18 - CCA MID 108 27 -  89 21 HM CCA DISTAL 99 24 HT  116 12 - ECA 292 42 HM  45 19 HM ICA PROXIMAL 110 27 HT  69 30 - ICA MID 86 24 HT  73 36 - ICA DISTAL 82 35 -    N/A ICA / CCA Ratio (PSV) 1.0  Antegrade Vertebral Flow Antegrade  - Brachial Systolic Pressure (mmHg) -  Triphasic Brachial Artery Waveforms Triphasic    Plaque Morphology:  HM = Homogeneous, HT = Heterogeneous, CP = Calcific Plaque, SP = Smooth Plaque, IP = Irregular Plaque     ADDITIONAL FINDINGS: Multiphasic subclavian artery waveforms    IMPRESSION: 1. Patent right carotid endarterectomy with less than 40% internal carotid artery stenosis. 2. Less than 40% left internal carotid artery stenosis. 3. Left external carotid artery stenosis.    Compared to the previous exam:  No significant change since prior exam     Assessment: Johnathan Keith is a 54 y.o. male who is s/p right carotid endarterectomy on 09/26/2014. He had a right brain stroke in 2014, has moderate left hemiparesis.  Today's carotid duplex suggests a patent right carotid endarterectomy with less than 40% internal carotid artery stenosis, and less than 40% left internal carotid artery stenosis. No significant change since prior exam on 04/07/15.   Plan: Follow-up in 1 year with Carotid Duplex scan.   I discussed in depth with the patient the nature of atherosclerosis, and emphasized the importance of maximal medical management including strict control of blood pressure, blood glucose, and lipid levels, obtaining regular exercise, and continued cessation of smoking.  The patient is aware that without maximal medical management the underlying atherosclerotic disease process will progress, limiting the benefit of any interventions. The patient was given information about stroke prevention and what symptoms should prompt the patient to seek immediate medical care. Thank you  for allowing [place] to participate in this patient's care.  Charisse March, RN, MSN, FNP-C [facility] Office: [phone]  Clinic Physician: [name]  10/27/2015 11:53 AM

## 2015-10-27 NOTE — Patient Instructions (Signed)
Stroke Prevention Some medical conditions and behaviors are associated with an increased chance of having a stroke. You may prevent a stroke by making healthy choices and managing medical conditions. HOW CAN I REDUCE MY RISK OF HAVING A STROKE?   Stay physically active. Get at least 30 minutes of activity on most or all days.  Do not smoke. It may also be helpful to avoid exposure to secondhand smoke.  Limit alcohol use. Moderate alcohol use is considered to be:  No more than 2 drinks per day for men.  No more than 1 drink per day for nonpregnant women.  Eat healthy foods. This involves:  Eating 5 or more servings of fruits and vegetables a day.  Making dietary changes that address high blood pressure (hypertension), high cholesterol, diabetes, or obesity.  Manage your cholesterol levels.  Making food choices that are high in fiber and low in saturated fat, trans fat, and cholesterol may control cholesterol levels.  Take any prescribed medicines to control cholesterol as directed by your health care provider.  Manage your diabetes.  Controlling your carbohydrate and sugar intake is recommended to manage diabetes.  Take any prescribed medicines to control diabetes as directed by your health care provider.  Control your hypertension.  Making food choices that are low in salt (sodium), saturated fat, trans fat, and cholesterol is recommended to manage hypertension.  Ask your health care provider if you need treatment to lower your blood pressure. Take any prescribed medicines to control hypertension as directed by your health care provider.  If you are 18-39 years of age, have your blood pressure checked every 3-5 years. If you are 40 years of age or older, have your blood pressure checked every year.  Maintain a healthy weight.  Reducing calorie intake and making food choices that are low in sodium, saturated fat, trans fat, and cholesterol are recommended to manage  weight.  Stop drug abuse.  Avoid taking birth control pills.  Talk to your health care provider about the risks of taking birth control pills if you are over 35 years old, smoke, get migraines, or have ever had a blood clot.  Get evaluated for sleep disorders (sleep apnea).  Talk to your health care provider about getting a sleep evaluation if you snore a lot or have excessive sleepiness.  Take medicines only as directed by your health care provider.  For some people, aspirin or blood thinners (anticoagulants) are helpful in reducing the risk of forming abnormal blood clots that can lead to stroke. If you have the irregular heart rhythm of atrial fibrillation, you should be on a blood thinner unless there is a good reason you cannot take them.  Understand all your medicine instructions.  Make sure that other conditions (such as anemia or atherosclerosis) are addressed. SEEK IMMEDIATE MEDICAL CARE IF:   You have sudden weakness or numbness of the face, arm, or leg, especially on one side of the body.  Your face or eyelid droops to one side.  You have sudden confusion.  You have trouble speaking (aphasia) or understanding.  You have sudden trouble seeing in one or both eyes.  You have sudden trouble walking.  You have dizziness.  You have a loss of balance or coordination.  You have a sudden, severe headache with no known cause.  You have new chest pain or an irregular heartbeat. Any of these symptoms may represent a serious problem that is an emergency. Do not wait to see if the symptoms will   go away. Get medical help at once. Call your local emergency services (911 in [place] Do not drive yourself to the hospital.   This information is not intended to replace advice given to you by your health care provider. Make sure you discuss any questions you have with your health care provider.   Document Released: 01/02/2005 Document Revised: 12/16/2014 Document Reviewed:  05/28/2013 Elsevier Interactive Patient Education 2016 Elsevier Inc.  

## 2015-10-30 DIAGNOSIS — Z7982 Long term (current) use of aspirin: Secondary | ICD-10-CM | POA: Diagnosis not present

## 2015-10-30 DIAGNOSIS — I252 Old myocardial infarction: Secondary | ICD-10-CM | POA: Diagnosis not present

## 2015-10-30 DIAGNOSIS — Z79899 Other long term (current) drug therapy: Secondary | ICD-10-CM | POA: Diagnosis not present

## 2015-10-30 DIAGNOSIS — E785 Hyperlipidemia, unspecified: Secondary | ICD-10-CM | POA: Diagnosis not present

## 2015-10-30 DIAGNOSIS — E119 Type 2 diabetes mellitus without complications: Secondary | ICD-10-CM | POA: Diagnosis not present

## 2015-10-30 DIAGNOSIS — Z9861 Coronary angioplasty status: Secondary | ICD-10-CM | POA: Diagnosis not present

## 2015-10-30 NOTE — Addendum Note (Signed)
Addended by: Adria Dill L on: 10/30/2015 09:40 AM   Modules accepted: Orders

## 2015-10-31 DIAGNOSIS — Z79899 Other long term (current) drug therapy: Secondary | ICD-10-CM | POA: Diagnosis not present

## 2015-10-31 DIAGNOSIS — I252 Old myocardial infarction: Secondary | ICD-10-CM | POA: Diagnosis not present

## 2015-10-31 DIAGNOSIS — Z7982 Long term (current) use of aspirin: Secondary | ICD-10-CM | POA: Diagnosis not present

## 2015-10-31 DIAGNOSIS — E785 Hyperlipidemia, unspecified: Secondary | ICD-10-CM | POA: Diagnosis not present

## 2015-10-31 DIAGNOSIS — Z9861 Coronary angioplasty status: Secondary | ICD-10-CM | POA: Diagnosis not present

## 2015-10-31 DIAGNOSIS — E119 Type 2 diabetes mellitus without complications: Secondary | ICD-10-CM | POA: Diagnosis not present

## 2015-11-01 DIAGNOSIS — I252 Old myocardial infarction: Secondary | ICD-10-CM | POA: Diagnosis not present

## 2015-11-01 DIAGNOSIS — Z79899 Other long term (current) drug therapy: Secondary | ICD-10-CM | POA: Diagnosis not present

## 2015-11-01 DIAGNOSIS — E119 Type 2 diabetes mellitus without complications: Secondary | ICD-10-CM | POA: Diagnosis not present

## 2015-11-01 DIAGNOSIS — Z7982 Long term (current) use of aspirin: Secondary | ICD-10-CM | POA: Diagnosis not present

## 2015-11-01 DIAGNOSIS — Z9861 Coronary angioplasty status: Secondary | ICD-10-CM | POA: Diagnosis not present

## 2015-11-01 DIAGNOSIS — E785 Hyperlipidemia, unspecified: Secondary | ICD-10-CM | POA: Diagnosis not present

## 2015-11-03 ENCOUNTER — Other Ambulatory Visit: Payer: Self-pay | Admitting: Cardiovascular Disease

## 2015-11-06 DIAGNOSIS — Z9861 Coronary angioplasty status: Secondary | ICD-10-CM | POA: Diagnosis not present

## 2015-11-06 DIAGNOSIS — E119 Type 2 diabetes mellitus without complications: Secondary | ICD-10-CM | POA: Diagnosis not present

## 2015-11-06 DIAGNOSIS — Z7982 Long term (current) use of aspirin: Secondary | ICD-10-CM | POA: Diagnosis not present

## 2015-11-06 DIAGNOSIS — E785 Hyperlipidemia, unspecified: Secondary | ICD-10-CM | POA: Diagnosis not present

## 2015-11-06 DIAGNOSIS — Z79899 Other long term (current) drug therapy: Secondary | ICD-10-CM | POA: Diagnosis not present

## 2015-11-06 DIAGNOSIS — I252 Old myocardial infarction: Secondary | ICD-10-CM | POA: Diagnosis not present

## 2015-11-08 DIAGNOSIS — Z7982 Long term (current) use of aspirin: Secondary | ICD-10-CM | POA: Diagnosis not present

## 2015-11-08 DIAGNOSIS — Z9861 Coronary angioplasty status: Secondary | ICD-10-CM | POA: Diagnosis not present

## 2015-11-08 DIAGNOSIS — Z79899 Other long term (current) drug therapy: Secondary | ICD-10-CM | POA: Diagnosis not present

## 2015-11-08 DIAGNOSIS — E119 Type 2 diabetes mellitus without complications: Secondary | ICD-10-CM | POA: Diagnosis not present

## 2015-11-08 DIAGNOSIS — E785 Hyperlipidemia, unspecified: Secondary | ICD-10-CM | POA: Diagnosis not present

## 2015-11-08 DIAGNOSIS — I252 Old myocardial infarction: Secondary | ICD-10-CM | POA: Diagnosis not present

## 2015-11-09 DIAGNOSIS — H26491 Other secondary cataract, right eye: Secondary | ICD-10-CM | POA: Diagnosis not present

## 2015-11-10 DIAGNOSIS — Z79899 Other long term (current) drug therapy: Secondary | ICD-10-CM | POA: Diagnosis not present

## 2015-11-10 DIAGNOSIS — Z9861 Coronary angioplasty status: Secondary | ICD-10-CM | POA: Diagnosis not present

## 2015-11-10 DIAGNOSIS — E785 Hyperlipidemia, unspecified: Secondary | ICD-10-CM | POA: Diagnosis not present

## 2015-11-10 DIAGNOSIS — Z7982 Long term (current) use of aspirin: Secondary | ICD-10-CM | POA: Diagnosis not present

## 2015-11-10 DIAGNOSIS — I252 Old myocardial infarction: Secondary | ICD-10-CM | POA: Diagnosis not present

## 2015-11-10 DIAGNOSIS — E119 Type 2 diabetes mellitus without complications: Secondary | ICD-10-CM | POA: Diagnosis not present

## 2015-11-10 DIAGNOSIS — Z794 Long term (current) use of insulin: Secondary | ICD-10-CM | POA: Diagnosis not present

## 2015-11-13 DIAGNOSIS — Z9861 Coronary angioplasty status: Secondary | ICD-10-CM | POA: Diagnosis not present

## 2015-11-13 DIAGNOSIS — Z79899 Other long term (current) drug therapy: Secondary | ICD-10-CM | POA: Diagnosis not present

## 2015-11-13 DIAGNOSIS — Z7982 Long term (current) use of aspirin: Secondary | ICD-10-CM | POA: Diagnosis not present

## 2015-11-13 DIAGNOSIS — E119 Type 2 diabetes mellitus without complications: Secondary | ICD-10-CM | POA: Diagnosis not present

## 2015-11-13 DIAGNOSIS — I252 Old myocardial infarction: Secondary | ICD-10-CM | POA: Diagnosis not present

## 2015-11-13 DIAGNOSIS — E785 Hyperlipidemia, unspecified: Secondary | ICD-10-CM | POA: Diagnosis not present

## 2015-11-14 DIAGNOSIS — E1140 Type 2 diabetes mellitus with diabetic neuropathy, unspecified: Secondary | ICD-10-CM | POA: Diagnosis not present

## 2015-11-14 DIAGNOSIS — Z6832 Body mass index (BMI) 32.0-32.9, adult: Secondary | ICD-10-CM | POA: Diagnosis not present

## 2015-11-14 DIAGNOSIS — E669 Obesity, unspecified: Secondary | ICD-10-CM | POA: Diagnosis not present

## 2015-11-14 DIAGNOSIS — E11319 Type 2 diabetes mellitus with unspecified diabetic retinopathy without macular edema: Secondary | ICD-10-CM | POA: Diagnosis not present

## 2015-11-14 DIAGNOSIS — Z794 Long term (current) use of insulin: Secondary | ICD-10-CM | POA: Diagnosis not present

## 2015-11-14 DIAGNOSIS — E1165 Type 2 diabetes mellitus with hyperglycemia: Secondary | ICD-10-CM | POA: Diagnosis not present

## 2015-11-14 DIAGNOSIS — E1139 Type 2 diabetes mellitus with other diabetic ophthalmic complication: Secondary | ICD-10-CM | POA: Diagnosis not present

## 2015-11-15 DIAGNOSIS — E785 Hyperlipidemia, unspecified: Secondary | ICD-10-CM | POA: Diagnosis not present

## 2015-11-15 DIAGNOSIS — I252 Old myocardial infarction: Secondary | ICD-10-CM | POA: Diagnosis not present

## 2015-11-15 DIAGNOSIS — Z9861 Coronary angioplasty status: Secondary | ICD-10-CM | POA: Diagnosis not present

## 2015-11-15 DIAGNOSIS — Z79899 Other long term (current) drug therapy: Secondary | ICD-10-CM | POA: Diagnosis not present

## 2015-11-15 DIAGNOSIS — E119 Type 2 diabetes mellitus without complications: Secondary | ICD-10-CM | POA: Diagnosis not present

## 2015-11-15 DIAGNOSIS — Z7982 Long term (current) use of aspirin: Secondary | ICD-10-CM | POA: Diagnosis not present

## 2015-11-17 DIAGNOSIS — I252 Old myocardial infarction: Secondary | ICD-10-CM | POA: Diagnosis not present

## 2015-11-17 DIAGNOSIS — Z7982 Long term (current) use of aspirin: Secondary | ICD-10-CM | POA: Diagnosis not present

## 2015-11-17 DIAGNOSIS — Z79899 Other long term (current) drug therapy: Secondary | ICD-10-CM | POA: Diagnosis not present

## 2015-11-17 DIAGNOSIS — E785 Hyperlipidemia, unspecified: Secondary | ICD-10-CM | POA: Diagnosis not present

## 2015-11-17 DIAGNOSIS — Z9861 Coronary angioplasty status: Secondary | ICD-10-CM | POA: Diagnosis not present

## 2015-11-17 DIAGNOSIS — E119 Type 2 diabetes mellitus without complications: Secondary | ICD-10-CM | POA: Diagnosis not present

## 2015-11-20 DIAGNOSIS — Z79899 Other long term (current) drug therapy: Secondary | ICD-10-CM | POA: Diagnosis not present

## 2015-11-20 DIAGNOSIS — Z9861 Coronary angioplasty status: Secondary | ICD-10-CM | POA: Diagnosis not present

## 2015-11-20 DIAGNOSIS — E119 Type 2 diabetes mellitus without complications: Secondary | ICD-10-CM | POA: Diagnosis not present

## 2015-11-20 DIAGNOSIS — E785 Hyperlipidemia, unspecified: Secondary | ICD-10-CM | POA: Diagnosis not present

## 2015-11-20 DIAGNOSIS — Z7982 Long term (current) use of aspirin: Secondary | ICD-10-CM | POA: Diagnosis not present

## 2015-11-20 DIAGNOSIS — I252 Old myocardial infarction: Secondary | ICD-10-CM | POA: Diagnosis not present

## 2015-11-21 DIAGNOSIS — R339 Retention of urine, unspecified: Secondary | ICD-10-CM | POA: Diagnosis not present

## 2015-11-21 DIAGNOSIS — N529 Male erectile dysfunction, unspecified: Secondary | ICD-10-CM | POA: Diagnosis not present

## 2015-11-21 DIAGNOSIS — N400 Enlarged prostate without lower urinary tract symptoms: Secondary | ICD-10-CM | POA: Diagnosis not present

## 2015-11-21 DIAGNOSIS — N430 Encysted hydrocele: Secondary | ICD-10-CM | POA: Diagnosis not present

## 2015-11-21 DIAGNOSIS — N3020 Other chronic cystitis without hematuria: Secondary | ICD-10-CM | POA: Diagnosis not present

## 2015-11-22 DIAGNOSIS — E119 Type 2 diabetes mellitus without complications: Secondary | ICD-10-CM | POA: Diagnosis not present

## 2015-11-22 DIAGNOSIS — Z79899 Other long term (current) drug therapy: Secondary | ICD-10-CM | POA: Diagnosis not present

## 2015-11-22 DIAGNOSIS — Z7982 Long term (current) use of aspirin: Secondary | ICD-10-CM | POA: Diagnosis not present

## 2015-11-22 DIAGNOSIS — E785 Hyperlipidemia, unspecified: Secondary | ICD-10-CM | POA: Diagnosis not present

## 2015-11-22 DIAGNOSIS — Z9861 Coronary angioplasty status: Secondary | ICD-10-CM | POA: Diagnosis not present

## 2015-11-22 DIAGNOSIS — I252 Old myocardial infarction: Secondary | ICD-10-CM | POA: Diagnosis not present

## 2015-11-23 DIAGNOSIS — Z79810 Long term (current) use of selective estrogen receptor modulators (SERMs): Secondary | ICD-10-CM | POA: Diagnosis not present

## 2015-11-23 DIAGNOSIS — Z119 Encounter for screening for infectious and parasitic diseases, unspecified: Secondary | ICD-10-CM | POA: Diagnosis not present

## 2015-11-23 DIAGNOSIS — E7800 Pure hypercholesterolemia, unspecified: Secondary | ICD-10-CM | POA: Diagnosis not present

## 2015-11-23 DIAGNOSIS — Z125 Encounter for screening for malignant neoplasm of prostate: Secondary | ICD-10-CM | POA: Diagnosis not present

## 2015-11-23 DIAGNOSIS — R531 Weakness: Secondary | ICD-10-CM | POA: Diagnosis not present

## 2015-11-23 DIAGNOSIS — E559 Vitamin D deficiency, unspecified: Secondary | ICD-10-CM | POA: Diagnosis not present

## 2015-11-23 DIAGNOSIS — M129 Arthropathy, unspecified: Secondary | ICD-10-CM | POA: Diagnosis not present

## 2015-11-24 DIAGNOSIS — Z7982 Long term (current) use of aspirin: Secondary | ICD-10-CM | POA: Diagnosis not present

## 2015-11-24 DIAGNOSIS — I252 Old myocardial infarction: Secondary | ICD-10-CM | POA: Diagnosis not present

## 2015-11-24 DIAGNOSIS — Z79899 Other long term (current) drug therapy: Secondary | ICD-10-CM | POA: Diagnosis not present

## 2015-11-24 DIAGNOSIS — E785 Hyperlipidemia, unspecified: Secondary | ICD-10-CM | POA: Diagnosis not present

## 2015-11-24 DIAGNOSIS — Z9861 Coronary angioplasty status: Secondary | ICD-10-CM | POA: Diagnosis not present

## 2015-11-24 DIAGNOSIS — E119 Type 2 diabetes mellitus without complications: Secondary | ICD-10-CM | POA: Diagnosis not present

## 2015-11-25 DIAGNOSIS — N529 Male erectile dysfunction, unspecified: Secondary | ICD-10-CM | POA: Insufficient documentation

## 2015-11-25 HISTORY — DX: Male erectile dysfunction, unspecified: N52.9

## 2015-11-27 DIAGNOSIS — E119 Type 2 diabetes mellitus without complications: Secondary | ICD-10-CM | POA: Diagnosis not present

## 2015-11-27 DIAGNOSIS — Z7982 Long term (current) use of aspirin: Secondary | ICD-10-CM | POA: Diagnosis not present

## 2015-11-27 DIAGNOSIS — Z9861 Coronary angioplasty status: Secondary | ICD-10-CM | POA: Diagnosis not present

## 2015-11-27 DIAGNOSIS — E785 Hyperlipidemia, unspecified: Secondary | ICD-10-CM | POA: Diagnosis not present

## 2015-11-27 DIAGNOSIS — Z79899 Other long term (current) drug therapy: Secondary | ICD-10-CM | POA: Diagnosis not present

## 2015-11-27 DIAGNOSIS — I252 Old myocardial infarction: Secondary | ICD-10-CM | POA: Diagnosis not present

## 2015-11-28 DIAGNOSIS — D729 Disorder of white blood cells, unspecified: Secondary | ICD-10-CM | POA: Diagnosis not present

## 2015-11-29 DIAGNOSIS — Z9861 Coronary angioplasty status: Secondary | ICD-10-CM | POA: Diagnosis not present

## 2015-11-29 DIAGNOSIS — Z79899 Other long term (current) drug therapy: Secondary | ICD-10-CM | POA: Diagnosis not present

## 2015-11-29 DIAGNOSIS — E119 Type 2 diabetes mellitus without complications: Secondary | ICD-10-CM | POA: Diagnosis not present

## 2015-11-29 DIAGNOSIS — I252 Old myocardial infarction: Secondary | ICD-10-CM | POA: Diagnosis not present

## 2015-11-29 DIAGNOSIS — Z7982 Long term (current) use of aspirin: Secondary | ICD-10-CM | POA: Diagnosis not present

## 2015-11-29 DIAGNOSIS — E785 Hyperlipidemia, unspecified: Secondary | ICD-10-CM | POA: Diagnosis not present

## 2015-11-30 ENCOUNTER — Encounter: Payer: Self-pay | Admitting: Cardiovascular Disease

## 2015-11-30 ENCOUNTER — Ambulatory Visit (INDEPENDENT_AMBULATORY_CARE_PROVIDER_SITE_OTHER): Payer: Medicare Other | Admitting: Cardiovascular Disease

## 2015-11-30 VITALS — BP 126/78 | HR 76 | Ht 66.0 in | Wt 211.0 lb

## 2015-11-30 DIAGNOSIS — E785 Hyperlipidemia, unspecified: Secondary | ICD-10-CM

## 2015-11-30 DIAGNOSIS — I10 Essential (primary) hypertension: Secondary | ICD-10-CM

## 2015-11-30 DIAGNOSIS — I2510 Atherosclerotic heart disease of native coronary artery without angina pectoris: Secondary | ICD-10-CM

## 2015-11-30 DIAGNOSIS — I2583 Coronary atherosclerosis due to lipid rich plaque: Secondary | ICD-10-CM

## 2015-11-30 DIAGNOSIS — Z9861 Coronary angioplasty status: Secondary | ICD-10-CM

## 2015-11-30 DIAGNOSIS — I6521 Occlusion and stenosis of right carotid artery: Secondary | ICD-10-CM | POA: Diagnosis not present

## 2015-11-30 HISTORY — DX: Atherosclerotic heart disease of native coronary artery without angina pectoris: I25.10

## 2015-11-30 HISTORY — DX: Atherosclerotic heart disease of native coronary artery without angina pectoris: Z98.61

## 2015-11-30 MED ORDER — METOPROLOL SUCCINATE ER 25 MG PO TB24: 25.0000 mg | ORAL_TABLET | Freq: Every day | ORAL | Status: DC

## 2015-11-30 NOTE — Assessment & Plan Note (Signed)
Coronary artery disease status post proximal and mid LAD intervention with drug-eluting stents/14/16 by myself. Circumflex and RCA were were free of significant disease and his LV function was preserved. This was done because of progressive dyspnea and a high-risk Myoview that showed anterolateral and inferior ischemia. Since that time he denies dyspnea on exertion or chest pain. He remains on dual antiplatelet therapy.

## 2015-11-30 NOTE — Progress Notes (Signed)
11/30/2015 Johnathan Keith   03/28/1961  [id number]  Primary Physician Jearld Lesch, MD Primary Cardiologist: Runell Gess MD [name]   HPI:  [name] is a 54 year old mildly overweight married Caucasian male who I last saw in the office 05/19/15.  In addition to hypertension, his cardiovascular history is significant for prior TIA subsequent to carotid artery disease. He underwent right carotid endarterectomy performed by Dr. [name] on 09/26/2014. There is also question of remote myocardial infarction in the past. He recently underwent evaluation with a 2-D echocardiogram that was performed preoperatively which showed normal LV function as well as a Myoview stress test that had subtle inferior and lateral ischemia. At that time he had no SOB or chest pain and was cleared for surgery. His history is also notable for hyperlipidemia and diabetes. He was last seen by Dr. Allyson Sabal 10/26/2014. During that time, he complained of symptomatic hypotension with systolic blood pressures in the low 90s. Subsequently Dr. Allyson Sabal decided to decrease his lisinopril down from 40 mg to 20 mg daily and now down to 10 mg daily. He is taking a forth of 40 mg table. Since that time he has been seen by our office pharmacist Phillips Hay for labile blood pressures. Though over all BP is improved. He is on most of his meds.   He has been on lasix 80 mg daily for a year for lower ext. Edema. His K+ has been elevated at times and currently he is off the kdur.His most recent lipid profile in our chart for/13/16 revealed total cholesterol 132, LDL 54 and HDL of 34.  In addition to lower ext edema he has DOE. No chest pain but walking in grocery store and to car he is very SOB. This has progressed since Nuc study. He has several risk factors: DM, hyperlipidemia, vascular disease and family hx CAD. His nuclear study was high risk for anterolateral and inferior ischemia. Based on this, and in  light of his increasing dyspnea on exertion as a potential anginal equivalent, he underwent diagnostic coronary arteriography by myself 03/23/15 revealing proximal and mid LAD stenoses which I intervened on with drug-eluting stents. The circumflex and RCA were free of significant disease as LV function was preserved. Since intervention the symptoms have markedly improved.   ssince I saw him 6 months ago he's remained clinically stable. He does get some early morning symptomatic hypotension.   Current Outpatient Prescriptions  Medication Sig Dispense Refill  . aspirin 81 MG chewable tablet Chew 1 tablet (81 mg total) by mouth daily.    . Aspirin-Acetaminophen-Caffeine (EXCEDRIN MIGRAINE PO) Take 1 tablet by mouth daily as needed (for migraine).     Marland Kitchen atorvastatin (LIPITOR) 40 MG tablet TAKE 1 TABLET ONCE DAILY. 30 tablet 1  . Cholecalciferol (VITAMIN D) 2000 UNITS tablet Take 2,000 Units by mouth daily.    . clopidogrel (PLAVIX) 75 MG tablet Take 1 tablet (75 mg total) by mouth daily with breakfast. 90 tablet 3  . dapagliflozin propanediol (FARXIGA) 10 MG TABS tablet Take 10 mg by mouth daily.    Marland Kitchen dicyclomine (BENTYL) 10 MG capsule Take 10 mg by mouth 3 (three) times daily before meals.     . diphenoxylate-atropine (LOMOTIL) 2.5-0.025 MG per tablet Take 1 tablet by mouth 3 (three) times daily.     Marland Kitchen FREESTYLE LITE test strip     . furosemide (LASIX) 40 MG tablet Take 40 mg by mouth. TAKE 40 MG ONE DAY AND 80 MG  THE NEXT DAY ALTERNATING.    . gabapentin (NEURONTIN) 100 MG capsule TAKE 1 CAPSULE THREE TIMES DAILY. 90 capsule 6  . glimepiride (AMARYL) 4 MG tablet Take 4 mg by mouth daily with breakfast.    . Insulin Detemir (LEVEMIR FLEXTOUCH) 100 UNIT/ML Pen Inject 50 Units into the skin 2 (two) times daily.    . lipase/protease/amylase (CREON) 36000 UNITS CPEP capsule Take 36,000 Units by mouth 3 (three) times daily before meals.    Marland Kitchen LOPERAMIDE HCL PO Take 2 mg by mouth.     . metFORMIN  (GLUCOPHAGE-XR) 500 MG 24 hr tablet Take 1 tablet (500 mg total) by mouth daily with supper.    . nitroGLYCERIN (NITROSTAT) 0.4 MG SL tablet Place 1 tablet (0.4 mg total) under the tongue every 5 (five) minutes as needed for chest pain. 25 tablet 2  . omeprazole (PRILOSEC) 20 MG capsule Take 20 mg by mouth daily as needed (acid reflux/ heartburn).     Bertram Gala Glycol-Propyl Glycol (SYSTANE OP) Place 1 drop into both eyes 2 (two) times daily.    Marland Kitchen SIMPLY SALINE NA Place 1 spray into both nostrils 2 (two) times daily.    . tamsulosin (FLOMAX) 0.4 MG CAPS capsule Take 1 capsule (0.4 mg total) by mouth daily. 30 capsule 1  . metoprolol succinate (TOPROL-XL) 25 MG 24 hr tablet Take 1 tablet (25 mg total) by mouth daily. Take with or immediately following a meal. 90 tablet 3   No current facility-administered medications for this visit.    No Known Allergies  Social History   Social History  . Marital Status: Married    Spouse Name: N/A  . Number of Children: 5  . Years of Education: 12th   Occupational History  . Disabled    Social History Main Topics  . Smoking status: Never Smoker   . Smokeless tobacco: Never Used  . Alcohol Use: No  . Drug Use: No  . Sexual Activity:    Partners: Female   Other Topics Concern  . Not on file   Social History Narrative   Patient is married with 5 children.    Patient is right handed.   Patient has hs education.   Patient drinks 4 12 oz can sodas daily.     Review of Systems: General: negative for chills, fever, night sweats or weight changes.  Cardiovascular: negative for chest pain, dyspnea on exertion, edema, orthopnea, palpitations, paroxysmal nocturnal dyspnea or shortness of breath Dermatological: negative for rash Respiratory: negative for cough or wheezing Urologic: negative for hematuria Abdominal: negative for nausea, vomiting, diarrhea, bright red blood per rectum, melena, or hematemesis Neurologic: negative for visual  changes, syncope, or dizziness All other systems reviewed and are otherwise negative except as noted above.    Blood pressure 126/78, pulse 76, height  (1.676 m), weight 211 lb (95.709 kg).  General appearance: alert and no distress Neck: no adenopathy, no carotid bruit, no JVD, supple, symmetrical, trachea midline and thyroid not enlarged, symmetric, no tenderness/mass/nodules Lungs: clear to auscultation bilaterally Heart: regular rate and rhythm, S1, S2 normal, no murmur, click, rub or gallop Extremities: extremities normal, atraumatic, no cyanosis or edema  EKG not performed today  ASSESSMENT AND PLAN:   Hyperlipidemia History of hyperlipidemia on atorvastatin 40 mg a day with recent lipid profile performed 03/22/15 revealing a total cholesterol 132, LDL 54 HDL of 34.  Essential hypertension History of hypertension blood pressure measured at 126/78. He is on metoprolol 25 mg by  mouth twice a day. He complains of some morning hypotension which is symptomatic from. I'm going to change his metoprolol titrate to Toprol-XL 25 mg once a day.  Carotid artery stenosis-Rt CEA Oct 2015 History of carotid artery disease status post right carotid endarterectomy October 2015 followed by vascular surgery.  Coronary artery disease Coronary artery disease status post proximal and mid LAD intervention with drug-eluting stents/14/16 by myself. Circumflex and RCA were were free of significant disease and his LV function was preserved. This was done because of progressive dyspnea and a high-risk Myoview that showed anterolateral and inferior ischemia. Since that time he denies dyspnea on exertion or chest pain. He remains on dual antiplatelet therapy.      Runell Gess MD FACP,FACC,FAHA, [facility] 11/30/2015 9:44 AM

## 2015-11-30 NOTE — Assessment & Plan Note (Signed)
History of carotid artery disease status post right carotid endarterectomy October 2015 followed by vascular surgery.

## 2015-11-30 NOTE — Patient Instructions (Signed)
Medication Instructions:  Your physician has recommended you make the following change in your medication:  1) STOP Lopressor 2) START Toprol XL 25 mg ONCE daily   Labwork: none  Testing/Procedures: none  Follow-Up: Your physician wants you to follow-up in: 12 months with Dr. Allyson Sabal. You will receive a reminder letter in the mail two months in advance. If you don't receive a letter, please call our office to schedule the follow-up appointment.   Any Other Special Instructions Will Be Listed Below (If Applicable).     If you need a refill on your cardiac medications before your next appointment, please call your pharmacy.

## 2015-11-30 NOTE — Assessment & Plan Note (Signed)
History of hyperlipidemia on atorvastatin 40 mg a day with recent lipid profile performed 03/22/15 revealing a total cholesterol 132, LDL 54 HDL of 34.

## 2015-11-30 NOTE — Assessment & Plan Note (Signed)
History of hypertension blood pressure measured at 126/78. He is on metoprolol 25 mg by mouth twice a day. He complains of some morning hypotension which is symptomatic from. I'm going to change his metoprolol titrate to Toprol-XL 25 mg once a day.

## 2015-12-05 DIAGNOSIS — E1169 Type 2 diabetes mellitus with other specified complication: Secondary | ICD-10-CM | POA: Diagnosis not present

## 2015-12-05 DIAGNOSIS — N521 Erectile dysfunction due to diseases classified elsewhere: Secondary | ICD-10-CM | POA: Diagnosis not present

## 2015-12-05 DIAGNOSIS — E291 Testicular hypofunction: Secondary | ICD-10-CM | POA: Diagnosis not present

## 2015-12-14 ENCOUNTER — Telehealth: Payer: Self-pay | Admitting: Cardiovascular Disease

## 2015-12-14 NOTE — Telephone Encounter (Signed)
Received Attending Physician Statement Leonie Douglas) for Dr Allyson Sabal to review, complete and sign.  Received [employer] paperwork by Fax - no AUTH/PMT.  Sent to [employer] @ [address] [employer] for [employer] to send letter/pkt for AUTH/PMT.  Sent to [employer] on 12/13/14 via Courier. lp

## 2016-01-02 DIAGNOSIS — Z0289 Encounter for other administrative examinations: Secondary | ICD-10-CM

## 2016-01-03 ENCOUNTER — Telehealth: Payer: Self-pay

## 2016-01-03 NOTE — Telephone Encounter (Signed)
LFt message for patient to call back. PT drop off disability form for Dr. Roda Shutters. Pts last disability form was done by his vein vascular MD, Dr. Roda Shutters stated the form should be done by the vein vascular md.

## 2016-01-04 NOTE — Telephone Encounter (Signed)
Disability form done fax to [employer] receive and confirm via fax. Form was fax twice.

## 2016-01-04 NOTE — Telephone Encounter (Signed)
Pt returned Katrina's call. He said [employer] sent him letter said form needed to be filled out by Dr Roda Shutters and Dr Allyson Sabal this time. Last time it was Dr Claudie Fisherman and Dr Mayford Knife, Dr Enrique Sack.

## 2016-01-11 DIAGNOSIS — E291 Testicular hypofunction: Secondary | ICD-10-CM

## 2016-01-11 HISTORY — DX: Testicular hypofunction: E29.1

## 2016-01-19 DIAGNOSIS — Z0289 Encounter for other administrative examinations: Secondary | ICD-10-CM

## 2016-01-23 ENCOUNTER — Telehealth: Payer: Self-pay | Admitting: Cardiovascular Disease

## 2016-01-23 NOTE — Telephone Encounter (Signed)
Received [employer] Disability Forms back from [facility] @ [address] for Dr Allyson Sabal to review, complete and sign.  Forms given to Selena Batten, RN for Dr Allyson Sabal to sign and return. lp

## 2016-02-12 DIAGNOSIS — E6609 Other obesity due to excess calories: Secondary | ICD-10-CM

## 2016-02-12 DIAGNOSIS — Z794 Long term (current) use of insulin: Secondary | ICD-10-CM | POA: Insufficient documentation

## 2016-02-12 DIAGNOSIS — E119 Type 2 diabetes mellitus without complications: Secondary | ICD-10-CM

## 2016-02-12 HISTORY — DX: Other obesity due to excess calories: E66.09

## 2016-02-12 HISTORY — DX: Type 2 diabetes mellitus without complications: E11.9

## 2016-02-20 ENCOUNTER — Telehealth: Payer: Self-pay | Admitting: Cardiovascular Disease

## 2016-02-20 DIAGNOSIS — N138 Other obstructive and reflux uropathy: Secondary | ICD-10-CM

## 2016-02-20 DIAGNOSIS — N401 Enlarged prostate with lower urinary tract symptoms: Secondary | ICD-10-CM | POA: Insufficient documentation

## 2016-02-20 HISTORY — DX: Other obstructive and reflux uropathy: N13.8

## 2016-02-20 NOTE — Telephone Encounter (Signed)
Received Signed [employer] Disability Forms back from Dr Allyson Sabal.  Faxed to [facility] and notified patient,

## 2016-02-22 DIAGNOSIS — E113311 Type 2 diabetes mellitus with moderate nonproliferative diabetic retinopathy with macular edema, right eye: Secondary | ICD-10-CM | POA: Diagnosis not present

## 2016-03-07 DIAGNOSIS — E113313 Type 2 diabetes mellitus with moderate nonproliferative diabetic retinopathy with macular edema, bilateral: Secondary | ICD-10-CM | POA: Diagnosis not present

## 2016-03-12 ENCOUNTER — Telehealth: Payer: Self-pay | Admitting: Neurology

## 2016-03-12 ENCOUNTER — Ambulatory Visit: Payer: No Typology Code available for payment source | Admitting: Neurology

## 2016-03-12 NOTE — Telephone Encounter (Signed)
Patients wife cancel appt he r/s for later this week.

## 2016-03-12 NOTE — Telephone Encounter (Signed)
Wife Darl Pikes [phone] called 4/4 9:19am to advise husband got turned around this morning coming to 9:30am appointment with Dr. Roda Shutters, normally she brings to appointment however he wanted to drive himself, in order to be more. Independent.

## 2016-03-13 ENCOUNTER — Encounter: Payer: Self-pay | Admitting: Neurology

## 2016-03-14 ENCOUNTER — Ambulatory Visit (INDEPENDENT_AMBULATORY_CARE_PROVIDER_SITE_OTHER): Payer: Medicare Other | Admitting: Neurology

## 2016-03-14 ENCOUNTER — Encounter: Payer: Self-pay | Admitting: Neurology

## 2016-03-14 VITALS — BP 160/94 | HR 92 | Ht 66.0 in | Wt 217.6 lb

## 2016-03-14 DIAGNOSIS — G5603 Carpal tunnel syndrome, bilateral upper limbs: Secondary | ICD-10-CM | POA: Diagnosis not present

## 2016-03-14 DIAGNOSIS — I6302 Cerebral infarction due to thrombosis of basilar artery: Secondary | ICD-10-CM

## 2016-03-14 DIAGNOSIS — I10 Essential (primary) hypertension: Secondary | ICD-10-CM | POA: Diagnosis not present

## 2016-03-14 DIAGNOSIS — M545 Low back pain, unspecified: Secondary | ICD-10-CM

## 2016-03-14 DIAGNOSIS — I6521 Occlusion and stenosis of right carotid artery: Secondary | ICD-10-CM | POA: Diagnosis not present

## 2016-03-14 DIAGNOSIS — E785 Hyperlipidemia, unspecified: Secondary | ICD-10-CM

## 2016-03-14 NOTE — Progress Notes (Signed)
STROKE NEUROLOGY FOLLOW UP NOTE  NAME: Johnathan Keith DOB: 20-Aug-1961  REASON FOR VISIT: stroke follow up HISTORY FROM: pt and chart  Today we had the pleasure of seeing Johnathan Keith in follow-up at our Neurology Clinic. Pt was accompanied by wife.   History Summary Johnathan Keith presents today as clinic follow up of stroke and right carotid stenosis > 70% in 2014. He had left hemiparesis in 2014 but CT and MRI did not show acute stroke, however, by my read, it is questionable a small DWI change at right pons without corresponding ADC attenuation. He has multiple stroke risk factors including HTN, DM, HLD, and right carotid stenosis.  He is on ASA and lipitor now for stroke prevention.  08/04/14 follow up - During the interval time, the patient has been doing fine without changes. He has seen his PCP and made changes for his DM regimen since his A1c was very high.    His right carotid stenosis > 70% a year ago without intervention, at that time his ICA/CCA ratio was 3.19, as compared to his left ICA/CCA ratio 1.02.  During the interval, we repeated CUS which showed right proximal/mid ICA >70% stenosis, the right ICA/CCA ratio 7.06 and left ICA/CCA ratio 0.95. The right ICA stenosis progressed from one year ago. He also complains that he has finger and toes tingling numbness as well as distal extremities cold with purple color from time to time.   11/09/14 follow up - he has followed up with Dr. [name] and had right CEA done on 09/26/14. Since then, he has intermittent chocking on eating solid food or drinking liquid. He is going to see GI next week. He also followed up with cardiology for low BP and his lisinopril decreased from  daily to  daily. He stated that his latest A1C check was 9.9 down from previous 12.3. His BP was well controlled, today in clinic 121/72.  He stated that he has migraine hx and could happen once a week, if not taking medications it may last 2 days. But if takes  Excedrin early in the course, it may last only 1hr after lying down and sleep. He continued to complains of tingling bilateral fingertips and toes. Denies smoking.    03/13/15 follow up - he was doing well. He stated that he can not stand too long otherwise he will have LBP, leg pain and neck pain and he has to lie down. He still has leg numbness and tingling. His sugar not in good control and around 200s due to insurance not covering new generation of DM meds which keeps his glucose at 150s. Now he has to go back to metformin and insulin. His BP better controlled, today 133/75. His right neck wound healed well and he is going to see Dr. [name] in 3 weeks and will do CUS again there. His swallow much better and only occasional chock with certain food.   09/12/15 follow up - he is doing the same. He had EMG/NCS with Dr. 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 The presence of active denervation at left thoracic paraspinals could also indicate more widespread central nervous system degenerative process, differentiation diagnosis also includes diabetic thoracic radiculopathies, nutritional deficiencies, inflammatory process. There was evidence of severe bilateral carpal tunnel syndromes." MRI lumbar and cervical spine done showed cervical radiculopathy but lack of lumbosacral radiculopathy. Recommended PT and prescribed gabapentin to him. He stated  that gabapentin helped him with pain but not get the numbness tingling away. He had right shoulder injections for shoulder pain and currently following with ortho to get knee injections too. He is yet to see ortho for carpel tunnel syndrome.   Interval History During the interval time, he is doing the somewhat better. No recurrent stroke like symptoms but still has LBP and sexual dysfunction. He follows with ortho for CTS and scheduled  later for left CTS surgery. Completed PT/OT and now at [facility] for self exercise. Has ? Macular degeneration, following with ophthal for retinal injection. BP 160/94.   REVIEW OF SYSTEMS: Full 14 system review of systems performed and notable only for those listed below and in HPI above, all others are negative:  Constitutional:  Cardiovascular:  Leg swelling Ear/Nose/Throat: ear discharge, runny nose Skin: wounds, rash, itching  Eyes:  Eye discharge, eye itching, eye redness, light sensitivity, blurry vision Respiratory: SOB Gastroitestinal: constipation, diarrhea, incontinence of bowels Genitourinary: frequent urination Hematology/Lymphatic: N/A  Endocrine: excessive thirst  Musculoskeletal: joint pain, back pain, muscle cramps, walking difficulty, neck pain, neck stiffness  Allergy/Immunology: frequent infections  Neurological: memory loss, dizziness, numbness, weakness Psychiatric:  Sleep: apnea, frequent waking, daytime sleepiness  The following represents the patient's updated allergies and side effects list: No Known Allergies  The neurologically relevant items on the patient's problem list were reviewed on today's visit.  Neurologic Examination  A problem focused neurological exam (12 or more points of the single system neurologic examination, vital signs counts as 1 point, cranial nerves count for 8 points) was performed.  Blood pressure 160/94, pulse 92, height 5\' 6"  (1.676 m), weight 217 lb 9.6 oz (98.703 kg).  General - Well nourished, well developed, in no apparent distress.  Ophthalmologic - Sharp disc margins OU.  Cardiovascular - Regular rate and rhythm with no murmur.   Neck - supple, no nuchal rigidity. Carotid no bruits. Mental Status -  Level of arousal and orientation to time, place, and person were intact.  Language including expression, naming, repetition, comprehension was assessed and found intact.  Attention span and concentration were normal.  Recent and  remote memory were intact.  Fund of Knowledge was assessed and was intact.  Cranial Nerves II - XII -  II - Vision intact OU.  III, IV, VI - Extraocular movements intact.  V - Facial sensation decreased on the left.  VII - Facial movement intact bilaterally.  VIII - Hearing & vestibular intact bilaterally.  X - Palate elevates symmetrically.  XI - Chin turning & shoulder shrug intact bilaterally.  XII - Tongue protrusion intact.  Motor Strength - The patient's strength was normal in all extremities and pronator drift was absent. Bulk was normal and fasciculations were absent.  Motor Tone - Muscle tone was assessed at the neck and appendages and was normal.  Reflexes - The patient's reflexes were normal in all extremities and he had no pathological reflexes.  Sensory - Light touch, temperature/pinprick were assessed and were decreased on left UE.  Coordination - The patient had normal movements in the hands and feet with no ataxia or dysmetria. Tremor was absent.  Gait and Station - broad based gait, carries the cane while walking.  Images and labs:  I have personally reviewed the radiological images below and agree with the radiology interpretations.  CTA neck - Atherosclerotic disease in the right carotid bifurcation. 15 mm above the bifurcation, focal severe stenosis measuring 75% diameter stenosis Mild atherosclerotic disease left carotid bifurcation  without significant stenosis Both vertebral arteries widely patent.  CUS - 80-99% right ICA stenosis.  2D echo - - Left ventricle: The cavity size was normal. Wall thickness was increased in a pattern of mild LVH. Systolic function was normal. The estimated ejection fraction was in the range of 55% to 60%. Wall motion was normal; there were no regional wall motion abnormalities. Doppler parameters are consistent with abnormal left ventricular relaxation (grade 1 diastolic dysfunction). The E/e&' ratio is >15,  suggesting elevated LV filling pressure. - Aortic valve: Sclerosis without stenosis. There was trace to mild regurgitation. - Left atrium: LA volume/ BSA = 20.7 ml/m2. The atrium was normal in size. - Tricuspid valve: There was trivial regurgitation. - Pulmonary arteries: PA peak pressure: 24 mm Hg (S). Impressions: - LVEF 55-60%, mild LVH, normal wall motion, diastolic dysfunction, elevated LV filling pressure, normal LA size, aortic valve sclerosis with trace to mild AI.  MRI C- spine: 1. Left paramedian disc herniation superimposed on broader disc protrusion at C6-C7 causing severe left neural foraminal narrowing that could lead to left C7 nerve root compression. 2. Small right paramedian disc protrusion at C7-T1 causing mild to moderate right foraminal narrowing with no nerve root impingement noted. 3. Milder degenerative changes at C3-C4 and C5-C6 do not lead to any foraminal narrowing or nerve root impingement.  MRI L spine: This is a borderline normal age-appropriate MRI showing minimal degenerative changes at T12-L1 and L5-S1 that did not lead to any nerve root impingement.   EMG - This is an abnormal study. 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 The presence of active denervation at left thoracic paraspinals could also indicate more widespread central nervous system degenerative process, differentiation diagnosis also includes diabetic thoracic radiculopathies, nutritional deficiencies, inflammatory process. There was evidence of severe bilateral carpal tunnel syndromes.   Component     Latest Ref Rng 07/27/2014  Cholesterol, Total     100 - 199 mg/dL 161  Triglycerides     0 - 149 mg/dL 096 (H)  HDL     >04 mg/dL 30 (L)  VLDL Cholesterol Cal     5 - 40 mg/dL 42 (H)  LDL (calc)     0 - 99 mg/dL 84  Total CHOL/HDL Ratio      0.0 - 5.0 ratio units 5.2 (H)  TSH     0.450 - 4.500 uIU/mL 2.860  Free T4     0.82 - 1.77 ng/dL 5.40  Hgb J8J MFr Bld     4.8 - 5.6 % 12.3 (H)  Est. average glucose Bld gHb Est-mCnc      306    Assessment: As you may recall, he is a 55 y.o. Caucasian male with PMH of HTN, HLD, DM and right ICA stenosis followed up in clinic today for stroke and right ICA stenosis. He had left hemiparesis in 2014 but CT and MRI did not show acute stroke, however, by my read, it is questionable a small DWI change at right pons without corresponding ADC attenuation. Regardless, he has multiple stroke risk factors including HTN, DM, HLD, and right carotid stenosis. He is on ASA, plavix and lipitor now for stroke and cardiac prevention. He also has right carotid stenosis and had right CEA by Dr. [name]. EMG and MRI evaluation showed possible lumbar and cervical radiculopathy, on PT/OT. He also has severe carpel tunnel syndrome and following with ortho, and plan for left CTS  surgery later. On wrist brace.  BP still mildly high.  Plan:  - continue ASA and plavix and lipitor for stroke and cardiac prevention - continue neurontin for neuropathic pain. - check BP and glucose at home - follow up with orthopedics for joint pain and bilateral CTS - Follow up with your primary care physician for stroke risk factor modification. Recommend maintain blood pressure goal <130/80, diabetes with hemoglobin A1c goal below 6.5% and lipids with LDL cholesterol goal below 70 mg/dL.  - follow up with Dr. [name] and Dr. Allyson Sabal as scheduled - self exercise - follow up as needed  I spent more than 25 minutes of face to face time with the patient. Greater than 50% of time was spent in counseling and coordination of care.  No orders of the defined types were placed in this encounter.    Patient Instructions  - continue ASA and plavix and lipitor for stroke and cardiac prevention - continue neurontin for neuropathic pain. - check BP  and glucose at home - follow up with orthopedics for joint pain and bilateral carpel tunnel syndrome - Follow up with your primary care physician for stroke risk factor modification. Recommend maintain blood pressure goal <130/80, diabetes with hemoglobin A1c goal below 6.5% and lipids with LDL cholesterol goal below 70 mg/dL.  - follow up with Dr. [name] and Dr. Allyson Sabal as scheduled - follow up as needed    Marvel Plan, MD PhD [facility] [address] [place], [place] [address] [phone]

## 2016-03-14 NOTE — Patient Instructions (Addendum)
-   continue ASA and plavix and lipitor for stroke and cardiac prevention - continue neurontin for neuropathic pain. - check BP and glucose at home - follow up with orthopedics for joint pain and bilateral carpel tunnel syndrome - Follow up with your primary care physician for stroke risk factor modification. Recommend maintain blood pressure goal <130/80, diabetes with hemoglobin A1c goal below 6.5% and lipids with LDL cholesterol goal below 70 mg/dL.  - follow up with Dr. [name] and Dr. Allyson Sabal as scheduled - follow up as needed

## 2016-03-18 ENCOUNTER — Other Ambulatory Visit: Payer: Self-pay | Admitting: *Deleted

## 2016-03-18 MED ORDER — CLOPIDOGREL BISULFATE 75 MG PO TABS: 75.0000 mg | ORAL_TABLET | Freq: Every day | ORAL | Status: DC

## 2016-04-23 ENCOUNTER — Telehealth: Payer: Self-pay | Admitting: *Deleted

## 2016-04-23 NOTE — Telephone Encounter (Signed)
OK to interrupt anti platelet Rx 

## 2016-04-23 NOTE — Telephone Encounter (Signed)
Requesting surgical clearance:   1. Type of surgery: Septoplasty and turbinate reduction  2. Surgeon: Dr Maudie Flakes  3. Surgical date: TO BE DETERMINED  4. Medications that need to be held: Plavix and aspirin and for how long  5. CAD: yes     6. I will defer to: Dr Allyson Sabal

## 2016-04-23 NOTE — Telephone Encounter (Signed)
Spoke with Pharmacist, ok to hold Plavix and Aspirin for 7 days prior to procedure.

## 2016-04-23 NOTE — Telephone Encounter (Signed)
This encounter faxed as clearance to Dr Marcheta Grammes at [facility].

## 2016-05-03 ENCOUNTER — Telehealth: Payer: Self-pay | Admitting: *Deleted

## 2016-05-03 NOTE — Telephone Encounter (Signed)
RECEIVED CLEARANCE TO REFAX TO [facility]. CLEARANCE NOTE RE-FAXED TO [phone]   Stann Mainland, RN at 04/23/2016 4:42 PM     Status: Signed       Expand All Collapse All   Spoke with Pharmacist, ok to hold Plavix and Aspirin for 7 days prior to procedure.              Runell Gess, MD at 04/23/2016 2:17 PM     Status: Signed       Expand All Collapse All   OK to interrupt anti platelet Rx            Stann Mainland, RN at 04/23/2016 9:19 AM     Status: Signed       Expand All Collapse All   Requesting surgical clearance:   1. Type of surgery: Septoplasty and turbinate reduction  2. Surgeon: Dr Maudie Flakes  3. Surgical date: TO BE DETERMINED  4. Medications that need to be held: Plavix and aspirin and for how long  5. CAD: yes   6. I will defer to: Dr Allyson Sabal

## 2016-05-23 ENCOUNTER — Encounter: Payer: Self-pay | Admitting: Cardiology

## 2016-05-28 ENCOUNTER — Ambulatory Visit (INDEPENDENT_AMBULATORY_CARE_PROVIDER_SITE_OTHER): Payer: Medicare Other | Admitting: Cardiovascular Disease

## 2016-05-28 ENCOUNTER — Encounter: Payer: Self-pay | Admitting: Cardiovascular Disease

## 2016-05-28 ENCOUNTER — Encounter: Payer: Self-pay | Admitting: *Deleted

## 2016-05-28 VITALS — BP 104/58 | HR 91 | Ht 67.0 in | Wt 216.0 lb

## 2016-05-28 DIAGNOSIS — E785 Hyperlipidemia, unspecified: Secondary | ICD-10-CM | POA: Diagnosis not present

## 2016-05-28 DIAGNOSIS — I2583 Coronary atherosclerosis due to lipid rich plaque: Secondary | ICD-10-CM

## 2016-05-28 DIAGNOSIS — I10 Essential (primary) hypertension: Secondary | ICD-10-CM | POA: Diagnosis not present

## 2016-05-28 DIAGNOSIS — I6521 Occlusion and stenosis of right carotid artery: Secondary | ICD-10-CM | POA: Diagnosis not present

## 2016-05-28 DIAGNOSIS — I2510 Atherosclerotic heart disease of native coronary artery without angina pectoris: Secondary | ICD-10-CM | POA: Diagnosis not present

## 2016-05-28 NOTE — Assessment & Plan Note (Signed)
History of coronary artery disease status post recent proximal and mid LAD FFR guided drug-eluting stenting by myself were/14/16. Remainder of his anatomy was unremarkable and his LV function was normal. He denies chest pain or shortness of breath. He scheduled for sinus surgery in the upcoming future which I think he can have low risk. He can interrupt his antibiotic therapy.

## 2016-05-28 NOTE — Progress Notes (Signed)
05/28/2016 Johnathan Keith   09/02/1961  [id number]  Primary Physician Jearld Lesch, MD Primary Cardiologist: Runell Gess MD [name]  HPI:  Mr. Errico is a 55 year old mildly overweight married Caucasian male who I last saw in the office 11/30/15.  In addition to hypertension, his cardiovascular history is significant for prior TIA subsequent to carotid artery disease. He underwent right carotid endarterectomy performed by Dr. [name] on 09/26/2014. There is also question of remote myocardial infarction in the past. He recently underwent evaluation with a 2-D echocardiogram that was performed preoperatively which showed normal LV function as well as a Myoview stress test that had subtle inferior and lateral ischemia. At that time he had no SOB or chest pain and was cleared for surgery. His history is also notable for hyperlipidemia and diabetes. He was last seen by Dr. Allyson Sabal 10/26/2014. During that time, he complained of symptomatic hypotension with systolic blood pressures in the low 90s. Subsequently Dr. Allyson Sabal decided to decrease his lisinopril down from 40 mg to 20 mg daily and now down to 10 mg daily. He is taking a forth of 40 mg table. Since that time he has been seen by our office pharmacist Phillips Hay for labile blood pressures. Though over all BP is improved. He is on most of his meds.   He has been on lasix 80 mg daily for a year for lower ext. Edema. His K+ has been elevated at times and currently he is off the kdur.His most recent lipid profile in our chart for/13/16 revealed total cholesterol 132, LDL 54 and HDL of 34.  In addition to lower ext edema he has DOE. No chest pain but walking in grocery store and to car he is very SOB. This has progressed since Nuc study. He has several risk factors: DM, hyperlipidemia, vascular disease and family hx CAD. His nuclear study was high risk for anterolateral and inferior ischemia. Based on this, and in  light of his increasing dyspnea on exertion as a potential anginal equivalent, he underwent diagnostic coronary arteriography by myself 03/23/15 revealing proximal and mid LAD stenoses which I intervened on with drug-eluting stents. The circumflex and RCA were free of significant disease as LV function was preserved. Since intervention the symptoms have markedly improved.   since I saw him a year ago he's remained clinically stable. He is scheduled to have nasal surgery in the upcoming future and is cleared at low risk for this.   Current Outpatient Prescriptions  Medication Sig Dispense Refill  . aspirin 81 MG chewable tablet Chew 1 tablet (81 mg total) by mouth daily.    Marland Kitchen atorvastatin (LIPITOR) 40 MG tablet TAKE 1 TABLET ONCE DAILY. 30 tablet 1  . BAYER MICROLET LANCETS lancets     . Cholecalciferol (VITAMIN D) 2000 UNITS tablet Take 2,000 Units by mouth daily.    . clopidogrel (PLAVIX) 75 MG tablet Take 1 tablet (75 mg total) by mouth daily with breakfast. 90 tablet 2  . dapagliflozin propanediol (FARXIGA) 10 MG TABS tablet     . dicyclomine (BENTYL) 10 MG capsule Take 10 mg by mouth 3 (three) times daily before meals.     . diphenoxylate-atropine (LOMOTIL) 2.5-0.025 MG per tablet Take 1 tablet by mouth 3 (three) times daily.     . fluticasone (FLONASE) 50 MCG/ACT nasal spray Place into both nostrils daily.    Marland Kitchen FREESTYLE LITE test strip     . furosemide (LASIX) 40 MG tablet Take  40 mg by mouth. TAKE 40 MG ONE DAY AND 80 MG THE NEXT DAY ALTERNATING.    . gabapentin (NEURONTIN) 100 MG capsule TAKE 1 CAPSULE THREE TIMES DAILY. 90 capsule 6  . glimepiride (AMARYL) 4 MG tablet Take 4 mg by mouth daily with breakfast.    . glucose blood (BAYER CONTOUR NEXT TEST) test strip     . Ibuprofen (ADVIL MIGRAINE) 200 MG CAPS Take by mouth.    . Insulin Detemir (LEVEMIR FLEXTOUCH) 100 UNIT/ML Pen Inject 50 Units into the skin 2 (two) times daily.    . lipase/protease/amylase (CREON) 36000 UNITS CPEP  capsule Take 36,000 Units by mouth 3 (three) times daily before meals.    Marland Kitchen lisinopril (PRINIVIL,ZESTRIL) 20 MG tablet     . LOPERAMIDE HCL PO Take 2 mg by mouth.     . metFORMIN (GLUCOPHAGE-XR) 500 MG 24 hr tablet Take 1 tablet (500 mg total) by mouth daily with supper.    . metoprolol succinate (TOPROL-XL) 25 MG 24 hr tablet Take 1 tablet (25 mg total) by mouth daily. Take with or immediately following a meal. 90 tablet 3  . nitroGLYCERIN (NITROSTAT) 0.4 MG SL tablet Place 1 tablet (0.4 mg total) under the tongue every 5 (five) minutes as needed for chest pain. 25 tablet 2  . pantoprazole (PROTONIX) 40 MG tablet Take 40 mg by mouth daily.    Bertram Gala Glycol-Propyl Glycol (SYSTANE OP) Place 1 drop into both eyes 2 (two) times daily.    . tamsulosin (FLOMAX) 0.4 MG CAPS capsule Take 1 capsule (0.4 mg total) by mouth daily. 30 capsule 1  . testosterone cypionate (DEPOTESTOSTERONE CYPIONATE) 200 MG/ML injection INJECT ONE ML INTRAMUSCULARLY EVERY TWO WEEKS  4   No current facility-administered medications for this visit.    No Known Allergies  Social History   Social History  . Marital Status: Married    Spouse Name: N/A  . Number of Children: 5  . Years of Education: 12th   Occupational History  . Disabled    Social History Main Topics  . Smoking status: Never Smoker   . Smokeless tobacco: Never Used  . Alcohol Use: No  . Drug Use: No  . Sexual Activity:    Partners: Female   Other Topics Concern  . Not on file   Social History Narrative   Patient is married with 5 children.    Patient is right handed.   Patient has hs education.   Patient drinks 4 12 oz can sodas daily.     Review of Systems: General: negative for chills, fever, night sweats or weight changes.  Cardiovascular: negative for chest pain, dyspnea on exertion, edema, orthopnea, palpitations, paroxysmal nocturnal dyspnea or shortness of breath Dermatological: negative for rash Respiratory: negative for  cough or wheezing Urologic: negative for hematuria Abdominal: negative for nausea, vomiting, diarrhea, bright red blood per rectum, melena, or hematemesis Neurologic: negative for visual changes, syncope, or dizziness All other systems reviewed and are otherwise negative except as noted above.    Blood pressure 104/58, pulse 91, height 5\' 7"  (1.702 m), weight 216 lb (97.977 kg).  General appearance: alert and no distress Neck: no adenopathy, no carotid bruit, no JVD, supple, symmetrical, trachea midline and thyroid not enlarged, symmetric, no tenderness/mass/nodules Lungs: clear to auscultation bilaterally Heart: regular rate and rhythm, S1, S2 normal, no murmur, click, rub or gallop Extremities: extremities normal, atraumatic, no cyanosis or edema  EKG normal sinus rhythm at 91 without ST or T-wave changes.  I personally reviewed this EKG  ASSESSMENT AND PLAN:   Carotid artery stenosis-Rt CEA Oct 2015 History of carotid artery disease status post endarterectomy performed by Dr. [name] 09/26/14. He follows his cardiac Doppler studies.  Hyperlipidemia History of hyperlipidemia on statin therapy followed by his PCP  Essential hypertension history of hypertension blood pressure measures 104/58. He is on lisinopril and metoprolol. Continue current meds current dosing  Coronary artery disease History of coronary artery disease status post recent proximal and mid LAD FFR guided drug-eluting stenting by myself were/14/16. Remainder of his anatomy was unremarkable and his LV function was normal. He denies chest pain or shortness of breath. He scheduled for sinus surgery in the upcoming future which I think he can have low risk. He can interrupt his antibiotic therapy.      Runell Gess MD FACP,FACC,FAHA, [facility] 05/28/2016 4:09 PM

## 2016-05-28 NOTE — Assessment & Plan Note (Signed)
history of hypertension blood pressure measures 104/58. He is on lisinopril and metoprolol. Continue current meds current dosing

## 2016-05-28 NOTE — Assessment & Plan Note (Signed)
History of carotid artery disease status post endarterectomy performed by Dr. [name] 09/26/14. He follows his cardiac Doppler studies.

## 2016-05-28 NOTE — Patient Instructions (Signed)

## 2016-05-28 NOTE — Assessment & Plan Note (Signed)
History of hyperlipidemia on statin therapy followed by his PCP 

## 2016-05-29 ENCOUNTER — Telehealth: Payer: Self-pay | Admitting: *Deleted

## 2016-05-29 NOTE — Telephone Encounter (Signed)
Clearance letter for upcoming surgery with Dr Maudie Flakes generated and faxed to [facility] at [phone].

## 2016-08-01 ENCOUNTER — Telehealth: Payer: Self-pay | Admitting: Cardiovascular Disease

## 2016-08-01 NOTE — Telephone Encounter (Signed)
Left message for pt to call.

## 2016-08-01 NOTE — Telephone Encounter (Signed)
Spoke with pt wife, advised for pt not to take the lisinopril or the metoprolol today. He has a follow up appointment tomorrow. He is feeling weak and dizzy. He has already taken his furosemide today.

## 2016-08-01 NOTE — Telephone Encounter (Signed)
New message    Pt c/o BP issue: STAT if pt c/o blurred vision, one-sided weakness or slurred speech  1. What are your last 5 BP readings? Today 84/64 yesterday  106/79   2. Are you having any other symptoms (ex. Dizziness, headache, blurred vision, passed out)? If he stands up gets a little dizziness,   3. What is your BP issue? Should he take his blood pressure medication

## 2016-08-02 ENCOUNTER — Ambulatory Visit (INDEPENDENT_AMBULATORY_CARE_PROVIDER_SITE_OTHER): Payer: Medicare Other | Admitting: Cardiology

## 2016-08-02 ENCOUNTER — Encounter: Payer: Self-pay | Admitting: Cardiology

## 2016-08-02 VITALS — BP 115/76 | HR 97 | Ht 66.0 in | Wt 213.2 lb

## 2016-08-02 DIAGNOSIS — I10 Essential (primary) hypertension: Secondary | ICD-10-CM

## 2016-08-02 DIAGNOSIS — E785 Hyperlipidemia, unspecified: Secondary | ICD-10-CM

## 2016-08-02 DIAGNOSIS — R0609 Other forms of dyspnea: Secondary | ICD-10-CM

## 2016-08-02 DIAGNOSIS — Z9861 Coronary angioplasty status: Secondary | ICD-10-CM

## 2016-08-02 DIAGNOSIS — I2510 Atherosclerotic heart disease of native coronary artery without angina pectoris: Secondary | ICD-10-CM | POA: Diagnosis not present

## 2016-08-02 DIAGNOSIS — E1159 Type 2 diabetes mellitus with other circulatory complications: Secondary | ICD-10-CM

## 2016-08-02 DIAGNOSIS — I6521 Occlusion and stenosis of right carotid artery: Secondary | ICD-10-CM

## 2016-08-02 DIAGNOSIS — R0600 Dyspnea, unspecified: Secondary | ICD-10-CM

## 2016-08-02 MED ORDER — ISOSORBIDE MONONITRATE ER 30 MG PO TB24: 15.0000 mg | ORAL_TABLET | Freq: Every day | ORAL | 3 refills | Status: DC

## 2016-08-02 NOTE — Assessment & Plan Note (Signed)
LAD DES 2016. Recent admission to [facility] with SOB, not felt to be in CHF, Myoview apparently low risk.

## 2016-08-02 NOTE — Progress Notes (Signed)
08/02/2016 Johnathan Keith   11-03-1961  [id number]  Primary Physician Jearld Lesch, MD Primary Cardiologist: Dr Allyson Sabal  HPI:  55 y/o male followed by Dr Allyson Sabal with a history of CAD, DM, HTN, HLD, and PVD, s/p RCEA 2015. He is in the office today with multiple complaints. He has Rt neck pain, arm pain, Lt leg pain, a rash, and SOB. He presented to an Urgent care in [place] recently with SOB and sweating. CXR-? CHF and he was sent to the ED at [facility]. They did not feel he had CHF. He was admitted and had a Myoview the next day and was discharged, I am waiting for the official report but the pt says they told him it was OK. He continues to episodic SOB, not always brought on by exertion. It;s similar to his pre PCI symptoms. He did not have chest pain prior to his PCI, just SOB.    Current Outpatient Prescriptions  Medication Sig Dispense Refill  . aspirin 81 MG chewable tablet Chew 1 tablet (81 mg total) by mouth daily.    Marland Kitchen atorvastatin (LIPITOR) 40 MG tablet TAKE 1 TABLET ONCE DAILY. 30 tablet 1  . Cholecalciferol (VITAMIN D) 2000 UNITS tablet Take 2,000 Units by mouth daily.    . clopidogrel (PLAVIX) 75 MG tablet Take 1 tablet (75 mg total) by mouth daily with breakfast. 90 tablet 2  . dapagliflozin propanediol (FARXIGA) 10 MG TABS tablet     . dicyclomine (BENTYL) 10 MG capsule Take 10 mg by mouth 3 (three) times daily before meals.     . diphenoxylate-atropine (LOMOTIL) 2.5-0.025 MG per tablet Take 1 tablet by mouth 3 (three) times daily.     . furosemide (LASIX) 40 MG tablet Take 40 mg by mouth. TAKE 40 MG ONE DAY AND 80 MG THE NEXT DAY ALTERNATING.    . gabapentin (NEURONTIN) 100 MG capsule TAKE 1 CAPSULE THREE TIMES DAILY. 90 capsule 6  . glimepiride (AMARYL) 4 MG tablet Take 4 mg by mouth daily with breakfast.    . Insulin Detemir (LEVEMIR FLEXTOUCH) 100 UNIT/ML Pen Inject 50 Units into the skin 2 (two) times daily.    Marland Kitchen LOPERAMIDE HCL PO Take 2 mg by mouth daily.     . metFORMIN  (GLUCOPHAGE-XR) 500 MG 24 hr tablet Take 1 tablet (500 mg total) by mouth daily with supper.    . metoprolol succinate (TOPROL-XL) 25 MG 24 hr tablet Take 1 tablet (25 mg total) by mouth daily. Take with or immediately following a meal. 90 tablet 3  . nitroGLYCERIN (NITROSTAT) 0.4 MG SL tablet Place 1 tablet (0.4 mg total) under the tongue every 5 (five) minutes as needed for chest pain. 25 tablet 2  . pantoprazole (PROTONIX) 40 MG tablet Take 40 mg by mouth daily.    Bertram Gala Glycol-Propyl Glycol (SYSTANE OP) Place 1 drop into both eyes 2 (two) times daily.    . tamsulosin (FLOMAX) 0.4 MG CAPS capsule Take 1 capsule (0.4 mg total) by mouth daily. 30 capsule 1  . isosorbide mononitrate (IMDUR) 30 MG 24 hr tablet Take 0.5 tablets (15 mg total) by mouth daily. 45 tablet 3   No current facility-administered medications for this visit.     No Known Allergies  Social History   Social History  . Marital status: Married    Spouse name: N/A  . Number of children: 5  . Years of education: 12th   Occupational History  . Disabled    Social  History Main Topics  . Smoking status: Never Smoker  . Smokeless tobacco: Never Used  . Alcohol use No  . Drug use: No  . Sexual activity: Not Currently    Partners: Female   Other Topics Concern  . Not on file   Social History Narrative   Patient is married with 5 children.    Patient is right handed.   Patient has hs education.   Patient drinks 4 12 oz can sodas daily.     Review of Systems: General: negative for chills, fever, night sweats or weight changes.  Cardiovascular: negative for chest pain, dyspnea on exertion, edema, orthopnea, palpitations, paroxysmal nocturnal dyspnea or shortness of breath Dermatological: negative for rash Respiratory: negative for cough or wheezing Urologic: negative for hematuria Abdominal: negative for nausea, vomiting, diarrhea, bright red blood per rectum, melena, or hematemesis Neurologic: negative for  visual changes, syncope, or dizziness All other systems reviewed and are otherwise negative except as noted above.    Blood pressure 115/76, pulse 97, height 5\' 6"  (1.676 m), weight 213 lb 3.2 oz (96.7 kg).  General appearance: alert, cooperative, no distress and mildly obese Neck: no JVD and bilateral CA bruit, RCA scar Lungs: clear to auscultation bilaterally Heart: regular rate and rhythm and soft systolic murmur LSB Extremities: no edema Skin: cool, wet, his shirt is soaked through Neurologic: Grossly normal  EKG NSR, no acute changes  ASSESSMENT AND PLAN:   Dyspnea on exertion This pt's anginal equivalent  Essential hypertension This has been labile by his report, frequently low  CAD -S/P PCI-2016 LAD DES 2016. [facility], not felt to be in CHF, Myoview apparently low risk.  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 Dopplers Nov 2016 showed no critical stenosis.   PLAN  Difficult situation. He has multiple complaints. He does have SOB that he says is similar to his pre PCI symptoms. I'll review his Myoview. I ordered an echo. Will discuss with Dr Allyson SabalBerry but we may need to cath him to make sure he doesn't have progressive CAD.   Corine ShelterLuke Biruk Troia PA-C 08/02/2016 4:12 PM

## 2016-08-02 NOTE — Assessment & Plan Note (Signed)
Pt has complaints of neck and leg pain, he says at CEA site. Dopplers Nov 2016 showed no critical stenosis.

## 2016-08-02 NOTE — Assessment & Plan Note (Signed)
On statin Rx 

## 2016-08-02 NOTE — Assessment & Plan Note (Signed)
This pt's anginal equivalent

## 2016-08-02 NOTE — Patient Instructions (Signed)
Medications  STOP Lisinopril  START Isosorbide 15 mg (1/2 tab) daily.   Procedures  Your physician has requested that you have an echocardiogram. Echocardiography is a painless test that uses sound waves to create images of your heart. It provides your doctor with information about the size and shape of your heart and how well your heart's chambers and valves are working. This procedure takes approximately one hour. There are no restrictions for this procedure.   --We will call you with results and let you know when to follow-up--

## 2016-08-02 NOTE — Assessment & Plan Note (Signed)
This has been labile by his report, frequently low

## 2016-08-02 NOTE — Assessment & Plan Note (Signed)
IDDM 

## 2016-08-08 ENCOUNTER — Telehealth: Payer: Self-pay | Admitting: Cardiovascular Disease

## 2016-08-08 NOTE — Telephone Encounter (Addendum)
Returned call to patient-pt is concerned about his BP and HR and when to take his metoprolol.  Pt reports his BP this AM was 120/86 HR 101 and he held his metoprolol but took his furosemide and isosorbide. States later he re-checked his BP and it was 103/57 HR 101. Reports HR gradually increasing over the last few days but also reports frequently holding his metoprolol as he is concerned it will drop his BP too low. Pt given guidelines to hold if systolic BP is <90 or HR < 50 per triage protocol.  Advised the metoprolol he is taking is a long acting medication and is to decrease his HR as well.  Advised to take todays dose of metoprolol and continue to monitor BP and HR.  Advised to call back in 3-4 days to follow up with BP readings or to call with further questions/concerns if needed.  Pt verbalized understanding.    Routed to pharmacist and Corine Shelter PA (OV on 8/25) for further recommendations.

## 2016-08-08 NOTE — Telephone Encounter (Signed)
Agree with plan as outlined.  Corine Shelter PA-C 08/08/2016 3:28 PM

## 2016-08-08 NOTE — Telephone Encounter (Signed)
Pt saw Franky Macho last Friday,pt have some concerns about his blood pressure readings and him taking the Metoprolol.

## 2016-08-08 NOTE — Telephone Encounter (Signed)
Agree - no further recommendations at this time.

## 2016-08-09 ENCOUNTER — Telehealth: Payer: Self-pay | Admitting: *Deleted

## 2016-08-09 NOTE — Telephone Encounter (Signed)
Called patient and wife answered, ok to discuss per DPR. Gave her Dr Allyson Sabal recommendations for pt to come in to see him. Appt scheduled for 08/27/16.

## 2016-08-09 NOTE — Telephone Encounter (Signed)
-----   Message from Runell Gess, MD sent at 08/05/2016  1:22 PM EDT ----- Agreed, may need table of truth. Can you set him up to see me back in the office to discuss??  JJB ----- Message ----- From: Abelino Derrick, PA-C Sent: 08/02/2016   4:22 PM To: Runell Gess, MD  Dr Allyson Sabal please review note, he may need cath to put cardiac issues to rest.

## 2016-08-15 ENCOUNTER — Other Ambulatory Visit: Payer: Self-pay

## 2016-08-15 ENCOUNTER — Ambulatory Visit (HOSPITAL_COMMUNITY): Payer: Medicare Other | Attending: Cardiology

## 2016-08-15 DIAGNOSIS — I6529 Occlusion and stenosis of unspecified carotid artery: Secondary | ICD-10-CM | POA: Insufficient documentation

## 2016-08-15 DIAGNOSIS — I2510 Atherosclerotic heart disease of native coronary artery without angina pectoris: Secondary | ICD-10-CM | POA: Insufficient documentation

## 2016-08-15 DIAGNOSIS — Z8673 Personal history of transient ischemic attack (TIA), and cerebral infarction without residual deficits: Secondary | ICD-10-CM | POA: Diagnosis not present

## 2016-08-15 DIAGNOSIS — R0609 Other forms of dyspnea: Secondary | ICD-10-CM | POA: Insufficient documentation

## 2016-08-15 DIAGNOSIS — E785 Hyperlipidemia, unspecified: Secondary | ICD-10-CM | POA: Diagnosis not present

## 2016-08-15 DIAGNOSIS — R0600 Dyspnea, unspecified: Secondary | ICD-10-CM | POA: Diagnosis present

## 2016-08-15 DIAGNOSIS — I059 Rheumatic mitral valve disease, unspecified: Secondary | ICD-10-CM | POA: Insufficient documentation

## 2016-08-15 DIAGNOSIS — I119 Hypertensive heart disease without heart failure: Secondary | ICD-10-CM | POA: Insufficient documentation

## 2016-08-15 DIAGNOSIS — I351 Nonrheumatic aortic (valve) insufficiency: Secondary | ICD-10-CM | POA: Insufficient documentation

## 2016-08-27 ENCOUNTER — Encounter: Payer: Self-pay | Admitting: Cardiovascular Disease

## 2016-08-27 ENCOUNTER — Ambulatory Visit (INDEPENDENT_AMBULATORY_CARE_PROVIDER_SITE_OTHER): Payer: Medicare Other | Admitting: Cardiovascular Disease

## 2016-08-27 VITALS — BP 116/70 | HR 96 | Ht 66.0 in | Wt 209.8 lb

## 2016-08-27 DIAGNOSIS — Z9861 Coronary angioplasty status: Secondary | ICD-10-CM | POA: Diagnosis not present

## 2016-08-27 DIAGNOSIS — I2510 Atherosclerotic heart disease of native coronary artery without angina pectoris: Secondary | ICD-10-CM | POA: Diagnosis not present

## 2016-08-27 DIAGNOSIS — I10 Essential (primary) hypertension: Secondary | ICD-10-CM

## 2016-08-27 DIAGNOSIS — G4730 Sleep apnea, unspecified: Secondary | ICD-10-CM | POA: Diagnosis not present

## 2016-08-27 NOTE — Patient Instructions (Signed)
Medication Instructions:  NO CHANGES.  Labwork: Labwork will be requested from your primary care physician.   Follow-Up: You have been referred to [facility] FOR EVALUATION OF SLEEP APNEA.  We request that you follow-up in: 6 MONTH WITH [name] and in 12 MONTHS with Dr San Morelle will receive a reminder letter in the mail two months in advance. If you don't receive a letter, please call our office to schedule the follow-up appointment.   If you need a refill on your cardiac medications before your next appointment, please call your pharmacy.

## 2016-08-27 NOTE — Assessment & Plan Note (Signed)
History of labile hypertension with blood pressure measures 116/70. He is on metoprolol. Continue current meds at current dosing

## 2016-08-27 NOTE — Assessment & Plan Note (Signed)
History of CAD status post and abnormal Myoview stress test which was read as high risk for anterolateral ischemia. This led to cardiac catheterization performed by myself 03/23/15 that showed a proximal and mid LAD stenosis which I intervened on with drug-eluting stents. As her complex and RCA were free of significant disease and his LV function was preserved. He recently had a stress test performed in July which was low risk and a 2-D echo that was normal. He does complain of some dyspnea on exertion.

## 2016-08-27 NOTE — Assessment & Plan Note (Signed)
History of carotid artery disease status post right carotid endarterectomy performed by Dr. [name] October 2014 when she follows

## 2016-08-27 NOTE — Assessment & Plan Note (Signed)
History of hyperlipidemia on statin therapy followed by his PCP 

## 2016-08-27 NOTE — Progress Notes (Signed)
08/27/2016 Johnathan Keith   [birth year]  [id number]  Primary Physician Jearld Lesch, MD Primary Cardiologist: Runell Gess MD [name]  HPI:    [name] is a 55 year old mildly overweight married Caucasian male who I last saw in the office 05/28/16.  In addition to hypertension, his cardiovascular history is significant for prior TIA subsequent to carotid artery disease. He underwent right carotid endarterectomy performed by Dr. [name] on 09/26/2014. There is also question of remote myocardial infarction in the past. He recently underwent evaluation with a 2-D echocardiogram that was performed preoperatively which showed normal LV function as well as a Myoview stress test that had subtle inferior and lateral ischemia. At that time he had no SOB or chest pain and was cleared for surgery. His history is also notable for hyperlipidemia and diabetes. He was last seen by Dr. Allyson Sabal 10/26/2014. During that time, he complained of symptomatic hypotension with systolic blood pressures in the low 90s. Subsequently Dr. Allyson Sabal decided to decrease his lisinopril down from 40 mg to 20 mg daily and now down to 10 mg daily. He is taking a forth of 40 mg table. Since that time he has been seen by our office pharmacist Phillips Hay for labile blood pressures. Though over all BP is improved. He is on most of his meds.   He has been on lasix 80 mg daily for a year for lower ext. Edema. His K+ has been elevated at times and currently he is off the kdur.His most recent lipid profile in our chart for/13/16 revealed total cholesterol 132, LDL 54 and HDL of 34.  In addition to lower ext edema he has DOE. No chest pain but walking in grocery store and to car he is very SOB. This has progressed since Nuc study. He has several risk factors: DM, hyperlipidemia, vascular disease and family hx CAD. His nuclear study was high risk for anterolateral and inferior ischemia. Based on this, and  in light of his increasing dyspnea on exertion as a potential anginal equivalent, he underwent diagnostic coronary arteriography by myself 03/23/15 revealing proximal and mid LAD stenoses which I intervened on with drug-eluting stents. The circumflex and RCA were free of significant disease as LV function was preserved. Since intervention the symptoms have markedly improved.   since I saw him a year ago he's remained clinically stable. He underwent nasal surgery 06/04/16. He saw Corine Shelter in the office 08/02/16 was normal as well. He continues to complain of some dyspnea on exertion however.  Current Outpatient Prescriptions  Medication Sig Dispense Refill  . aspirin 81 MG chewable tablet Chew 1 tablet (81 mg total) by mouth daily.    Marland Kitchen atorvastatin (LIPITOR) 40 MG tablet TAKE 1 TABLET ONCE DAILY. 30 tablet 1  . Cholecalciferol (VITAMIN D) 2000 UNITS tablet Take 2,000 Units by mouth daily.    . clopidogrel (PLAVIX) 75 MG tablet Take 1 tablet (75 mg total) by mouth daily with breakfast. 90 tablet 2  . dapagliflozin propanediol (FARXIGA) 10 MG TABS tablet     . dicyclomine (BENTYL) 10 MG capsule Take 10 mg by mouth 3 (three) times daily before meals.     . diphenoxylate-atropine (LOMOTIL) 2.5-0.025 MG per tablet Take 1 tablet by mouth 3 (three) times daily.     . furosemide (LASIX) 40 MG tablet Take 40 mg by mouth as directed. TAKE 40 MG ONE DAY AND 80 MG THE NEXT DAY ALTERNATING.     . gabapentin (  NEURONTIN) 100 MG capsule TAKE 1 CAPSULE THREE TIMES DAILY. 90 capsule 6  . glimepiride (AMARYL) 4 MG tablet Take 4 mg by mouth daily with breakfast.    . Insulin Detemir (LEVEMIR FLEXTOUCH) 100 UNIT/ML Pen Inject 50 Units into the skin 2 (two) times daily.    . isosorbide mononitrate (IMDUR) 30 MG 24 hr tablet Take 0.5 tablets (15 mg total) by mouth daily. 45 tablet 3  . LOPERAMIDE HCL PO Take 2 mg by mouth daily.     . metFORMIN (GLUCOPHAGE-XR) 500 MG 24 hr tablet Take 1 tablet (500 mg total) by mouth  daily with supper.    . metoprolol succinate (TOPROL-XL) 25 MG 24 hr tablet Take 25 mg by mouth as directed.    . nitroGLYCERIN (NITROSTAT) 0.4 MG SL tablet Place 1 tablet (0.4 mg total) under the tongue every 5 (five) minutes as needed for chest pain. 25 tablet 2  . pantoprazole (PROTONIX) 40 MG tablet Take 40 mg by mouth daily.    Bertram Gala. Polyethyl Glycol-Propyl Glycol (SYSTANE OP) Place 1 drop into both eyes 2 (two) times daily.    . tamsulosin (FLOMAX) 0.4 MG CAPS capsule Take 1 capsule (0.4 mg total) by mouth daily. 30 capsule 1   No current facility-administered medications for this visit.     No Known Allergies  Social History   Social History  . Marital status: Married    Spouse name: N/A  . Number of children: 5  . Years of education: 12th   Occupational History  . Disabled    Social History Main Topics  . Smoking status: Never Smoker  . Smokeless tobacco: Never Used  . Alcohol use No  . Drug use: No  . Sexual activity: Not Currently    Partners: Female   Other Topics Concern  . Not on file   Social History Narrative   Patient is married with 5 children.    Patient is right handed.   Patient has hs education.   Patient drinks 4 12 oz can sodas daily.     Review of Systems: General: negative for chills, fever, night sweats or weight changes.  Cardiovascular: negative for chest pain, dyspnea on exertion, edema, orthopnea, palpitations, paroxysmal nocturnal dyspnea or shortness of breath Dermatological: negative for rash Respiratory: negative for cough or wheezing Urologic: negative for hematuria Abdominal: negative for nausea, vomiting, diarrhea, bright red blood per rectum, melena, or hematemesis Neurologic: negative for visual changes, syncope, or dizziness All other systems reviewed and are otherwise negative except as noted above.    Blood pressure 116/70, pulse 96, height 5\' 6"  (1.676 m), weight 209 lb 12.8 oz (95.2 kg).  General appearance: alert and no  distress Neck: no adenopathy, no JVD, supple, symmetrical, trachea midline, thyroid not enlarged, symmetric, no tenderness/mass/nodules and Soft left carotid bruit Lungs: clear to auscultation bilaterally Heart: regular rate and rhythm, S1, S2 normal, no murmur, click, rub or gallop Extremities: extremities normal, atraumatic, no cyanosis or edema  EKG not performed today  ASSESSMENT AND PLAN:   Carotid artery stenosis-Rt CEA Oct 2015 History of carotid artery disease status post right carotid endarterectomy performed by Dr. Imogene Burnhen October 2014 when she follows  Hyperlipidemia History of hyperlipidemia on statin therapy followed by his PCP  Essential hypertension History of labile hypertension with blood pressure measures 116/70. He is on metoprolol. Continue current meds at current dosing  CAD -S/P PCI-2016 History of CAD status post and abnormal Myoview stress test which was read as high risk  for anterolateral ischemia. This led to cardiac catheterization performed by myself 03/23/15 that showed a proximal and mid LAD stenosis which I intervened on with drug-eluting stents. As her complex and RCA were free of significant disease and his LV function was preserved. He recently had a stress test performed in July which was low risk and a 2-D echo that was normal. He does complain of some dyspnea on exertion.      Runell Gess MD FACP,FACC,FAHA, [facility] 08/27/2016 10:40 AM

## 2016-10-24 ENCOUNTER — Encounter: Payer: Self-pay | Admitting: Family

## 2016-10-25 ENCOUNTER — Ambulatory Visit (INDEPENDENT_AMBULATORY_CARE_PROVIDER_SITE_OTHER): Payer: Medicare Other | Admitting: Family

## 2016-10-25 ENCOUNTER — Ambulatory Visit (HOSPITAL_COMMUNITY)
Admission: RE | Admit: 2016-10-25 | Discharge: 2016-10-25 | Disposition: A | Discharge status: Home / Self Care | Payer: Medicare Other | Source: Ambulatory Visit | Attending: Vascular Surgery | Admitting: Vascular Surgery

## 2016-10-25 ENCOUNTER — Encounter: Payer: Self-pay | Admitting: Family

## 2016-10-25 VITALS — BP 140/89 | HR 101 | Temp 97.0°F | Resp 16 | Ht 66.0 in | Wt 213.0 lb

## 2016-10-25 DIAGNOSIS — Z9889 Other specified postprocedural states: Secondary | ICD-10-CM

## 2016-10-25 DIAGNOSIS — I6521 Occlusion and stenosis of right carotid artery: Secondary | ICD-10-CM | POA: Diagnosis not present

## 2016-10-25 DIAGNOSIS — Z48812 Encounter for surgical aftercare following surgery on the circulatory system: Secondary | ICD-10-CM | POA: Diagnosis not present

## 2016-10-25 DIAGNOSIS — I6523 Occlusion and stenosis of bilateral carotid arteries: Secondary | ICD-10-CM | POA: Diagnosis not present

## 2016-10-25 LAB — VAS US CAROTID
LEFT ECA DIAS: -16 cm/s
Left CCA dist dias: -13 cm/s
Left CCA dist sys: -56 cm/s
Left CCA prox dias: 10 cm/s
Left CCA prox sys: 70 cm/s
Left ICA dist dias: -25 cm/s
Left ICA dist sys: -74 cm/s
Left ICA prox dias: -16 cm/s
Left ICA prox sys: -108 cm/s
RIGHT CCA MID DIAS: 9 cm/s
RIGHT ECA DIAS: -3 cm/s
RIGHT VERTEBRAL DIAS: -9 cm/s
Right CCA prox dias: 6 cm/s
Right CCA prox sys: 65 cm/s

## 2016-10-25 NOTE — Progress Notes (Signed)
Chief Complaint: Follow up Extracranial Carotid Artery Stenosis   History of Present Illness  Johnathan Keith is a 55 y.o. male patient of Dr. Imogene Burnhen who is s/p R CEA on 09/26/14. Patient has known history of TIA or stroke symptom in March 2014. The patient has never had amaurosis fugax or monocular blindness. The patient has had left sided weakness. The patient has never had receptive or expressive aphasia.   Cardiac cath with 2 stents placed in April 2016.  He had surgery for deviated septum in June 2017 due to oxygen desaturation when he nose breaths.  He needs bilateral carpal tunnel repair, left worse than right. He has had constipation alternating with incontinence diarrhea since 2014, has been evaluated by GI.   He has is under evaluation for OSA, wife states he stops breathing several times/night.  He has had intermittent pulsatile tinnitus in his left ear for about 2 years, states he will discuss this with the ENT specialist he is already seeing re his deviated septum. He states his cardiologist is adjusting his htn meds, he has had episodes of hypotension when he walks far enough or traverses stairs.   Pt Diabetic: yes, working with an endocrinologist to improve his glycemic control, Dr. Allena KatzPatel with [facility], 9.1 A1C on 07/11/16 (review of records). He states his most recent A1C was 8.7 Pt smoker: non-smoker, second hand exposure from his father as a child  Pt meds include: Statin : yes ASA: yes Other anticoagulants/antiplatelets: Plavix   Past Medical History:  Diagnosis Date  . Chronic neck and back pain   . Coronary artery disease    drug-eluting stents placed in proximal and mid LAD  . Deviated septum   . Diabetic retinopathy (HCC)   . GERD (gastroesophageal reflux disease)    "occasionally" (03/23/2015)  . Headache    "more than 2/wk" (03/23/2015)  . Heart attack    "in 1997 was told I had signs of a small heart attack that I didn't know I'd had"  . High  cholesterol   . History of gout    "when I was younger"  . Hypertension   . Kidney stones   . Migraine    "had my 1st and only in 12/2014" (03/23/2015)  . Peripheral vascular disease (HCC)    carotid artery disease  . PONV (postoperative nausea and vomiting)    "was told I was confused and combative in recovery from the narcotics w/my carotid OR"  . Sleep apnea    "severe; couldn't afford mask" (03/23/2015)  . Stroke [facility](HCC) 2014   "on walker for 2 months; left side is weaker and numb since" (03/23/2015)  . Type II diabetes mellitus (HCC)     Social History Social History  Substance Use Topics  . Smoking status: Never Smoker  . Smokeless tobacco: Never Used  . Alcohol use No    Family History Family History  Problem Relation Age of Onset  . Heart disease Mother   . Diabetes Mother   . Hyperlipidemia Mother   . Hypertension Mother   . Cancer Father     Lung Cancer   . Heart disease Father   . Heart attack Father   . Colon cancer Sister   . Hyperlipidemia Sister   . Hypertension Sister   . Gout Maternal Uncle   . Diabetes Maternal Grandfather   . Gout Brother   . Asthma Brother   . Hyperlipidemia Brother   . Hypertension Brother   . Diabetes Brother   .  Cancer Brother     colon  . Diabetes Daughter   . Stroke Paternal Grandfather     Surgical History Past Surgical History:  Procedure Laterality Date  . CIRCUMCISION  1981  . CORONARY ANGIOPLASTY WITH STENT PLACEMENT  03/23/2015   "2"  . ENDARTERECTOMY Right 09/26/2014   Procedure: RIGHT CAROTID ENDARTERECTOMY WITH PATCH ANGIOPLASTY;  Surgeon: Fransisco Hertz, MD;  Location: [facility] OR;  Service: Vascular;  Laterality: Right;  . EYE SURGERY Right May 2016   Cataract  . EYE SURGERY Left July 2016   Cataract  . HYDROCELE EXCISION  ~ 2008  . LEFT HEART CATHETERIZATION WITH CORONARY ANGIOGRAM N/A 03/23/2015   Procedure: LEFT HEART CATHETERIZATION WITH CORONARY ANGIOGRAM;  Surgeon: Runell Gess, MD;  Location: [facility] CATH  LAB;  Service: Cardiovascular;  Laterality: N/A;  . REFRACTIVE SURGERY Bilateral 08/2014-09/2014   Diabetic retinopathy     No Known Allergies  Current Outpatient Prescriptions  Medication Sig Dispense Refill  . aspirin 81 MG chewable tablet Chew 1 tablet (81 mg total) by mouth daily.    Marland Kitchen atorvastatin (LIPITOR) 40 MG tablet TAKE 1 TABLET ONCE DAILY. 30 tablet 1  . Cholecalciferol (VITAMIN D) 2000 UNITS tablet Take 2,000 Units by mouth daily.    . clopidogrel (PLAVIX) 75 MG tablet Take 1 tablet (75 mg total) by mouth daily with breakfast. 90 tablet 2  . dapagliflozin propanediol (FARXIGA) 10 MG TABS tablet     . dicyclomine (BENTYL) 10 MG capsule Take 10 mg by mouth 3 (three) times daily before meals.     . diphenoxylate-atropine (LOMOTIL) 2.5-0.025 MG per tablet Take 1 tablet by mouth 3 (three) times daily.     . furosemide (LASIX) 40 MG tablet Take 40 mg by mouth as directed. TAKE 40 MG ONE DAY AND 80 MG THE NEXT DAY ALTERNATING.     . gabapentin (NEURONTIN) 100 MG capsule TAKE 1 CAPSULE THREE TIMES DAILY. 90 capsule 6  . glimepiride (AMARYL) 4 MG tablet Take 4 mg by mouth daily with breakfast.    . Insulin Detemir (LEVEMIR FLEXTOUCH) 100 UNIT/ML Pen Inject 50 Units into the skin 2 (two) times daily.    . isosorbide mononitrate (IMDUR) 30 MG 24 hr tablet Take 0.5 tablets (15 mg total) by mouth daily. 45 tablet 3  . LOPERAMIDE HCL PO Take 2 mg by mouth daily.     . metFORMIN (GLUCOPHAGE-XR) 500 MG 24 hr tablet Take 1 tablet (500 mg total) by mouth daily with supper.    . metoprolol succinate (TOPROL-XL) 25 MG 24 hr tablet Take 25 mg by mouth as directed.    . nitroGLYCERIN (NITROSTAT) 0.4 MG SL tablet Place 1 tablet (0.4 mg total) under the tongue every 5 (five) minutes as needed for chest pain. 25 tablet 2  . pantoprazole (PROTONIX) 40 MG tablet Take 40 mg by mouth daily.    Bertram Gala Glycol-Propyl Glycol (SYSTANE OP) Place 1 drop into both eyes 2 (two) times daily.    . tamsulosin  (FLOMAX) 0.4 MG CAPS capsule Take 1 capsule (0.4 mg total) by mouth daily. 30 capsule 1   No current facility-administered medications for this visit.     Review of Systems : See HPI for pertinent positives and negatives.  Physical Examination  Vitals:   10/25/16 1413 10/25/16 1415  BP: 128/76 140/89  Pulse: (!) 101 (!) 101  Resp: 16   Temp: 97 F (36.1 C)   SpO2: 94%   Weight: 213 lb (96.6 kg)  Height: 5\' 6"  (1.676 m)    Body mass index is 34.38 kg/m.  General: WDWN male in NAD GAIT: using quad cane Eyes: PERRLA Pulmonary: Respirations are non-labored, CTAB, no rales, no rhonchi, or wheezing.  Cardiac: regular rhythm, no detected murmur.  VASCULAR EXAM Carotid Bruits Right Left   Negative Negative    Aorta is not palpable. Radial pulses are 1+ palpable and equal.                                                                                                                                          LE Pulses Right Left       POPLITEAL  not palpable  not palpable       POSTERIOR TIBIAL  2+ palpable  2+ palpable       DORSALIS PEDIS      ANTERIOR TIBIAL 2+ palpable 2+ palpable    Gastrointestinal: soft, nontender, BS WNL, no r/g, no palpable masses.  Musculoskeletal: no muscle atrophy/wasting. M/S 5/5 throughout except 4/5 in left leg, extremities without ischemic changes.  Neurologic: A&O X 3; Appropriate Affect, Speech is normal CN 2-12 intact, pain and light touch intact in extremities, Motor exam as listed above.     Assessment: Teagun Tivnan is a 55 y.o. male who is s/p right carotid endarterectomy on 09/26/2014. He had a right brain stroke in 2014, has moderate left hemiparesis; no subsequent stroke or TIA.   DATA  Today's carotid duplex suggests a patent right carotid endarterectomy with less than 40% internal carotid artery stenosis, and less than 40% left internal carotid artery stenosis. Left ECA stenosis. Bilateral vertebral artery flow  is antegrade. Bilateral subclavian artery waveforms are normal.  No significant change since last exam on 10-27-15.     Plan: Follow-up in 1 year with Carotid Duplex scan.  I discussed in depth with the patient the nature of atherosclerosis, and emphasized the importance of maximal medical management including strict control of blood pressure, blood glucose, and lipid levels, obtaining regular exercise, and continued cessation of smoking.  The patient is aware that without maximal medical management the underlying atherosclerotic disease process will progress, limiting the benefit of any interventions. The patient was given information about stroke prevention and what symptoms should prompt the patient to seek immediate medical care. Thank you for allowing [place] to participate in this patient's care.  Charisse March, RN, MSN, FNP-C [facility] Office: [phone]  Clinic Physician: Randie Heinz  10/25/16 2:18 PM

## 2016-10-25 NOTE — Patient Instructions (Signed)
Stroke Prevention Some medical conditions and behaviors are associated with an increased chance of having a stroke. You may prevent a stroke by making healthy choices and managing medical conditions. How can I reduce my risk of having a stroke?  Stay physically active. Get at least 30 minutes of activity on most or all days.  Do not smoke. It may also be helpful to avoid exposure to secondhand smoke.  Limit alcohol use. Moderate alcohol use is considered to be:  No more than 2 drinks per day for men.  No more than 1 drink per day for nonpregnant women.  Eat healthy foods. This involves:  Eating 5 or more servings of fruits and vegetables a day.  Making dietary changes that address high blood pressure (hypertension), high cholesterol, diabetes, or obesity.  Manage your cholesterol levels.  Making food choices that are high in fiber and low in saturated fat, trans fat, and cholesterol may control cholesterol levels.  Take any prescribed medicines to control cholesterol as directed by your health care provider.  Manage your diabetes.  Controlling your carbohydrate and sugar intake is recommended to manage diabetes.  Take any prescribed medicines to control diabetes as directed by your health care provider.  Control your hypertension.  Making food choices that are low in salt (sodium), saturated fat, trans fat, and cholesterol is recommended to manage hypertension.  Ask your health care provider if you need treatment to lower your blood pressure. Take any prescribed medicines to control hypertension as directed by your health care provider.  If you are 18-39 years of age, have your blood pressure checked every 3-5 years. If you are 40 years of age or older, have your blood pressure checked every year.  Maintain a healthy weight.  Reducing calorie intake and making food choices that are low in sodium, saturated fat, trans fat, and cholesterol are recommended to manage  weight.  Stop drug abuse.  Avoid taking birth control pills.  Talk to your health care provider about the risks of taking birth control pills if you are over 35 years old, smoke, get migraines, or have ever had a blood clot.  Get evaluated for sleep disorders (sleep apnea).  Talk to your health care provider about getting a sleep evaluation if you snore a lot or have excessive sleepiness.  Take medicines only as directed by your health care provider.  For some people, aspirin or blood thinners (anticoagulants) are helpful in reducing the risk of forming abnormal blood clots that can lead to stroke. If you have the irregular heart rhythm of atrial fibrillation, you should be on a blood thinner unless there is a good reason you cannot take them.  Understand all your medicine instructions.  Make sure that other conditions (such as anemia or atherosclerosis) are addressed. Get help right away if:  You have sudden weakness or numbness of the face, arm, or leg, especially on one side of the body.  Your face or eyelid droops to one side.  You have sudden confusion.  You have trouble speaking (aphasia) or understanding.  You have sudden trouble seeing in one or both eyes.  You have sudden trouble walking.  You have dizziness.  You have a loss of balance or coordination.  You have a sudden, severe headache with no known cause.  You have new chest pain or an irregular heartbeat. Any of these symptoms may represent a serious problem that is an emergency. Do not wait to see if the symptoms will go away.   Get medical help at once. Call your local emergency services (911 in [place] Do not drive yourself to the hospital. This information is not intended to replace advice given to you by your health care provider. Make sure you discuss any questions you have with your health care provider. Document Released: 01/02/2005 Document Revised: 05/02/2016 Document Reviewed: 05/28/2013 Elsevier  Interactive Patient Education  2017 [employer].  

## 2016-10-28 ENCOUNTER — Encounter: Payer: Self-pay | Admitting: Cardiovascular Disease

## 2016-10-28 ENCOUNTER — Ambulatory Visit (INDEPENDENT_AMBULATORY_CARE_PROVIDER_SITE_OTHER): Payer: Medicare Other | Admitting: Cardiovascular Disease

## 2016-10-28 VITALS — BP 120/72 | HR 88 | Ht 66.0 in | Wt 212.8 lb

## 2016-10-28 DIAGNOSIS — Z9889 Other specified postprocedural states: Secondary | ICD-10-CM

## 2016-10-28 DIAGNOSIS — Z9861 Coronary angioplasty status: Secondary | ICD-10-CM

## 2016-10-28 DIAGNOSIS — G478 Other sleep disorders: Secondary | ICD-10-CM

## 2016-10-28 DIAGNOSIS — I2510 Atherosclerotic heart disease of native coronary artery without angina pectoris: Secondary | ICD-10-CM

## 2016-10-28 DIAGNOSIS — I10 Essential (primary) hypertension: Secondary | ICD-10-CM

## 2016-10-28 DIAGNOSIS — R400 Somnolence: Secondary | ICD-10-CM | POA: Diagnosis not present

## 2016-10-28 DIAGNOSIS — E1159 Type 2 diabetes mellitus with other circulatory complications: Secondary | ICD-10-CM

## 2016-10-28 NOTE — Patient Instructions (Signed)
Your physician has recommended that you have a sleep study. This test records several body functions during sleep, including: brain activity, eye movement, oxygen and carbon dioxide blood levels, heart rate and rhythm, breathing rate and rhythm, the flow of air through your mouth and nose, snoring, body muscle movements, and chest and belly movement. This will be done at [facility].  Your physician recommends that you schedule a follow-up appointment in: MARCH-April office sleep clinic.    

## 2016-10-30 NOTE — Progress Notes (Signed)
Cardiology Office Note    Date:  10/30/2016   ID:  Artist Bloom, DOB 05/12/1961, MRN [id number]  PCP:  Harvie Junior, MD  Cardiologist:  Shelva Majestic, MD (sleep); Dr. Gwenlyn Found  Sleep Clinic evaluation  History of Present Illness:  Johnathan Keith is a 55 y.o. male who is referred by Dr. Gwenlyn Found for a sleep clinic evaluation.  Mr. Darley has a history of hypertension, cerebrovascular disease with prior TIA and carotid artery disease.  He underwent right carotid endarterectomy in 2015.  He's had issues with dyspnea on exertion.  CAD and underwent cardiac catheterization in 2016 which time he underwent DES stenting to a proximal and mid LAD stenosis.  He has been disabled since his TIA/stroke and he states he has residual left-sided weakness.  He has noticed significant difficulty with sleeping.  He has difficulty with sleep maintenance.  He has had episodes of witnessed apnea.  His sleep is nonrestorative.  He typically wakes up at least 4-5 times per night to go to the bathroom.  He has awakened gasping for breath.  He admits to daytime sleepiness.  Terminal arrhythmias.  He denies any nocturnal anginal symptoms.  He presents for evaluation.  Epworth Sleepiness Scale score was calculated and endorsed at 12 and shown below.  This is compatible with hypersomnolence.  Epworth Sleepiness Scale: Situation   Chance of Dozing/Sleeping (0 = never , 1 = slight chance , 2 = moderate chance , 3 = high chance )   sitting and reading  -   watching TV 1   sitting inactive in a public place 1   being a passenger in a motor vehicle for an hour or more 3   lying down in the afternoon 3   sitting and talking to someone 1   sitting quietly after lunch (no alcohol) 2   while stopped for a few minutes in traffic as the driver 1   Total Score  12   Past Medical History:  Diagnosis Date  . Chronic neck and back pain   . Coronary artery disease    drug-eluting stents placed in proximal and mid LAD    . Deviated septum   . Diabetic retinopathy ([place])   . GERD (gastroesophageal reflux disease)    "occasionally" (03/23/2015)  . Headache    "more than 2/wk" (03/23/2015)  . Heart attack    "in 1997 was told I had signs of a small heart attack that I didn't know I'd had"  . High cholesterol   . History of gout    "when I was younger"  . Hypertension   . Kidney stones   . Migraine    "had my 1st and only in 12/2014" (03/23/2015)  . Peripheral vascular disease ([place])    carotid artery disease  . PONV (postoperative nausea and vomiting)    "was told I was confused and combative in recovery from the narcotics w/my carotid OR"  . Sleep apnea    "severe; couldn't afford mask" (03/23/2015)  . Stroke [facility]) 2014   "on walker for 2 months; left side is weaker and numb since" (03/23/2015)  . Type II diabetes mellitus ([place])     Past Surgical History:  Procedure Laterality Date  . CIRCUMCISION  1981  . CORONARY ANGIOPLASTY WITH STENT PLACEMENT  03/23/2015   "2"  . ENDARTERECTOMY Right 09/26/2014   Procedure: RIGHT CAROTID ENDARTERECTOMY WITH PATCH ANGIOPLASTY;  Surgeon: Conrad Fairlawn, MD;  Location: [facility];  Service: Vascular;  Laterality:  Right;  Marland Kitchen EYE SURGERY Right May 2016   Cataract  . EYE SURGERY Left July 2016   Cataract  . HYDROCELE EXCISION  ~ 2008  . LEFT HEART CATHETERIZATION WITH CORONARY ANGIOGRAM N/A 03/23/2015   Procedure: LEFT HEART CATHETERIZATION WITH CORONARY ANGIOGRAM;  Surgeon: Lorretta Harp, MD;  Location: [facility] CATH LAB;  Service: Cardiovascular;  Laterality: N/A;  . REFRACTIVE SURGERY Bilateral 08/2014-09/2014   Diabetic retinopathy     Current Medications: Outpatient Medications Prior to Visit  Medication Sig Dispense Refill  . aspirin 81 MG chewable tablet Chew 1 tablet (81 mg total) by mouth daily.    Marland Kitchen atorvastatin (LIPITOR) 40 MG tablet TAKE 1 TABLET ONCE DAILY. 30 tablet 1  . Cholecalciferol (VITAMIN D) 2000 UNITS tablet Take 2,000 Units by mouth daily.    .  clopidogrel (PLAVIX) 75 MG tablet Take 1 tablet (75 mg total) by mouth daily with breakfast. 90 tablet 2  . dapagliflozin propanediol (FARXIGA) 10 MG TABS tablet     . dicyclomine (BENTYL) 10 MG capsule Take 10 mg by mouth 3 (three) times daily before meals.     . gabapentin (NEURONTIN) 100 MG capsule TAKE 1 CAPSULE THREE TIMES DAILY. 90 capsule 6  . Insulin Detemir (LEVEMIR FLEXTOUCH) 100 UNIT/ML Pen Inject 60 Units into the skin daily at 10 pm.     . isosorbide mononitrate (IMDUR) 30 MG 24 hr tablet Take 0.5 tablets (15 mg total) by mouth daily. 45 tablet 3  . metoprolol succinate (TOPROL-XL) 25 MG 24 hr tablet Take 25 mg by mouth daily as needed.     . nitroGLYCERIN (NITROSTAT) 0.4 MG SL tablet Place 1 tablet (0.4 mg total) under the tongue every 5 (five) minutes as needed for chest pain. 25 tablet 2  . pantoprazole (PROTONIX) 40 MG tablet Take 40 mg by mouth daily.    Vladimir Faster Glycol-Propyl Glycol (SYSTANE OP) Place 1 drop into both eyes 2 (two) times daily.    . tamsulosin (FLOMAX) 0.4 MG CAPS capsule Take 1 capsule (0.4 mg total) by mouth daily. 30 capsule 1  . diphenoxylate-atropine (LOMOTIL) 2.5-0.025 MG per tablet Take 1 tablet by mouth 3 (three) times daily.     . furosemide (LASIX) 40 MG tablet Take 40 mg by mouth as directed. TAKE 40 MG ONE DAY AND 80 MG THE NEXT DAY ALTERNATING.     Marland Kitchen glimepiride (AMARYL) 4 MG tablet Take 4 mg by mouth daily with breakfast.    . LOPERAMIDE HCL PO Take 2 mg by mouth daily.     . metFORMIN (GLUCOPHAGE-XR) 500 MG 24 hr tablet Take 1 tablet (500 mg total) by mouth daily with supper.     No facility-administered medications prior to visit.      Allergies:   Patient has no known allergies.   Social History   Social History  . Marital status: Married    Spouse name: N/A  . Number of children: 5  . Years of education: 12th   Occupational History  . Disabled    Social History Main Topics  . Smoking status: Never Smoker  . Smokeless tobacco:  Never Used  . Alcohol use No  . Drug use: No  . Sexual activity: Not Currently    Partners: Female   Other Topics Concern  . None   Social History Narrative   Patient is married with 5 children.    Patient is right handed.   Patient has hs education.   Patient drinks 4 12  oz can sodas daily.     Family History:  The patient's family history includes Asthma in his brother; Cancer in his brother and father; Colon cancer in his sister; Diabetes in his brother, daughter, maternal grandfather, and mother; Gout in his brother and maternal uncle; Heart attack in his father; Heart disease in his father and mother; Hyperlipidemia in his brother, mother, and sister; Hypertension in his brother, mother, and sister; Stroke in his paternal grandfather.   ROS General: Negative; No fevers, chills, or night sweats;  HEENT: Negative; No changes in vision or hearing, sinus congestion, difficulty swallowing Pulmonary: Negative; No cough, wheezing, shortness of breath, hemoptysis Cardiovascular: Negative; No chest pain, presyncope, syncope, palpitations GI: Negative; No nausea, vomiting, diarrhea, or abdominal pain GU: Negative; No dysuria, hematuria, or difficulty voiding Musculoskeletal: Negative; no myalgias, joint pain, or weakness Hematologic/Oncology: Negative; no easy bruising, bleeding Endocrine: Negative; no heat/cold intolerance; no diabetes Neuro: Negative; no changes in balance, headaches Skin: Negative; No rashes or skin lesions Psychiatric: Negative; No behavioral problems, depression Sleep: Positive for witnessed apnea, gasping for breath, snoring, daytime sleepiness, hypersomnolence; no bruxism, restless legs, hypnogognic hallucinations, no cataplexy Other comprehensive 14 point system review is negative.   PHYSICAL EXAM:   VS:  BP 120/72   Pulse 88   Ht 5' 6"  (1.676 m)   Wt 212 lb 12.8 oz (96.5 kg)   SpO2 97%   BMI 34.35 kg/m    Wt Readings from Last 3 Encounters:  10/28/16  212 lb 12.8 oz (96.5 kg)  10/25/16 213 lb (96.6 kg)  08/27/16 209 lb 12.8 oz (95.2 kg)    General: Alert, oriented, no distress.  Skin: normal turgor, no rashes, warm and dry HEENT: Normocephalic, atraumatic. Pupils equal round and reactive to light; sclera anicteric; extraocular muscles intact; Fundi , Disks flat, no hemorrhages or exudates. Nose without nasal septal hypertrophy Mouth/Parynx benign; Mallinpatti scale 3 Neck: No JVD, no carotid bruits; normal carotid upstroke Lungs: clear to ausculatation and percussion; no wheezing or rales Chest wall: without tenderness to palpitation Heart: PMI not displaced, RRR, s1 s2 normal, 1/6 systolic murmur, no diastolic murmur, no rubs, gallops, thrills, or heaves Abdomen: soft, nontender; no hepatosplenomehaly, BS+; abdominal aorta nontender and not dilated by palpation. Back: no CVA tenderness Pulses 2+ Musculoskeletal: full range of motion, normal strength, no joint deformities Extremities: no clubbing cyanosis or edema, Homan's sign negative  Neurologic: grossly nonfocal; Cranial nerves grossly wnl Psychologic: Normal mood and affect   Studies/Labs Reviewed:   EKG:  Not ordered today, but an independent review of his last ECG from 08/02/2016 shows normal sinus rhythm at 97 bpm.  There is no ectopy.  Recent Labs: BMP Latest Ref Rng & Units 04/18/2015 03/24/2015 03/22/2015  Glucose 70 - 99 mg/dL 117(H) 229(H) 135(H)  BUN 6 - 23 mg/dL 13 16 22   Creatinine 0.50 - 1.35 mg/dL 0.72 0.78 0.81  Sodium 135 - 145 mEq/L 144 140 144  Potassium 3.5 - 5.3 mEq/L 4.5 3.5 4.5  Chloride 96 - 112 mEq/L 104 103 103  CO2 19 - 32 mEq/L 28 25 28   Calcium 8.4 - 10.5 mg/dL 8.8 8.4 10.1     Hepatic Function Latest Ref Rng & Units 03/22/2015 09/22/2014 07/27/2014  Total Protein 6.0 - 8.3 g/dL 7.2 8.2 6.8  Albumin 3.5 - 5.2 g/dL 4.2 4.3 4.3  AST 0 - 37 U/L 17 18 20   ALT 0 - 53 U/L 22 29 34  Alk Phosphatase 39 - 117 U/L 47 61  58  Total Bilirubin 0.2 - 1.2  mg/dL 0.4 0.6 0.5  Bilirubin, Direct 0.00 - 0.40 mg/dL - - 0.15    CBC Latest Ref Rng & Units 03/24/2015 03/22/2015 09/27/2014  WBC 4.0 - 10.5 K/uL 9.7 11.6(H) 13.7(H)  Hemoglobin 13.0 - 17.0 g/dL 13.0 14.4 12.4(L)  Hematocrit 39.0 - 52.0 % 39.0 42.9 36.4(L)  Platelets 150 - 400 K/uL 229 283 221   Lab Results  Component Value Date   MCV 87.2 03/24/2015   MCV 85.3 03/22/2015   MCV 91.0 09/27/2014   Lab Results  Component Value Date   TSH 2.860 07/27/2014   Lab Results  Component Value Date   HGBA1C 12.3 (H) 07/27/2014     BNP No results found for: BNP  ProBNP No results found for: PROBNP   Lipid Panel     Component Value Date/Time   CHOL 132 03/22/2015 1136   CHOL 156 07/27/2014 0843   TRIG 222 (H) 03/22/2015 1136   HDL 34 (L) 03/22/2015 1136   HDL 30 (L) 07/27/2014 0843   CHOLHDL 3.9 03/22/2015 1136   VLDL 44 (H) 03/22/2015 1136   LDLCALC 54 03/22/2015 1136   LDLCALC 84 07/27/2014 0843     RADIOLOGY: No results found.   Additional studies/ records that were reviewed today include:   I reviewed the office records from Dr. Gwenlyn Found.   ASSESSMENT:    1. Non-restorative sleep   2. Wakes up during night   3. Daytime somnolence   4. Essential hypertension   5. History of carotid endarterectomy   6. CAD -S/P PCI-2016   7. Type 2 diabetes mellitus with vascular disease [facility])      PLAN:  Mr. Marquavion Venhuizen is a 55 year old gentleman who has significant cardiovascular comorbidities including hypertension, TIA and carotid disease.  His sleep history is highly suggestive of obstructive sleep apnea.  Specifically, his sleep is nonrestorative, he snores, he has awakened gasping for breath, he has had witnessed apnea, and he has nocturia at least 4-5 times per night.  His Epworth sleepiness scale score is elevated despite not filling in the initial response to sitting and reading.  I'm scheduling him for a sleep study for further evaluation of probable sleep apnea.   Discussed with him the importance of therapy particularly with reference to cardiovascular risk.  Hear rate.  He has a history of hypertension and previously had suffered a TIA/stroke.  His blood pressure today is controlled on his current regimen consisting of furosemide, isosorbide and metoprolol.  He is diabetic on 4 Zetia.  He is on statin therapy with target LDL less than 70.  He continues to be on dual antiplatelet therapy.  I will see him in a follow-up sleep clinic following his sleep study if his sleep study is positive for sleep apnea.   Medication Adjustments/Labs and Tests Ordered: Current medicines are reviewed at length with the patient today.  Concerns regarding medicines are outlined above.  Medication changes, Labs and Tests ordered today are listed in the Patient Instructions below.  Patient Instructions  Your physician has recommended that you have a sleep study. This test records several body functions during sleep, including: brain activity, eye movement, oxygen and carbon dioxide blood levels, heart rate and rhythm, breathing rate and rhythm, the flow of air through your mouth and nose, snoring, body muscle movements, and chest and belly movement. This will be done at [place] [place].   Your physician recommends that you schedule a follow-up  appointment in [facility] office sleep clinic.       Signed, Shelva Majestic, MD  10/30/2016 10:41 PM    [facility] [address], [place], [place], [place]  [address] Phone: [phone]

## 2016-11-21 ENCOUNTER — Telehealth: Payer: Self-pay | Admitting: Neurology

## 2016-11-21 NOTE — Telephone Encounter (Signed)
Pt called advised for about 2-1/2 mths he is having HA's, pulling sensation in eyes that goes down the sides of the neck close to where carotid surgery was, this is more of an ache happening more frequently. Dr Kathrin Penner PA advised pt to see PCP-(Dr Mayford Knife)- PCP advised pt to see Dr Roda Shutters.  An appt was schedule with Dr Roda Shutters for 12/18/16 (1st available). Please follow up with the patient

## 2016-11-22 NOTE — Telephone Encounter (Signed)
Called pt back for his message at 3:27pm today but nobody available for the call. I left VM for him to call back.   Marvel Plan, MD PhD Stroke Neurology 11/22/2016 3:27 PM

## 2016-11-29 NOTE — Telephone Encounter (Signed)
Noted. Will discuss at his next clinic visit. Thanks   Marvel Plan, MD PhD Stroke Neurology 11/29/2016 1:28 PM

## 2016-11-29 NOTE — Telephone Encounter (Signed)
Patient is returning a call stating he is still having headaches, neck pain and dizziness which has not changed, is no worse but feeling it more frequently. He has an appointment with Dr. Roda Shutters on 12-18-16 and will discuss then.

## 2016-12-18 ENCOUNTER — Ambulatory Visit (HOSPITAL_BASED_OUTPATIENT_CLINIC_OR_DEPARTMENT_OTHER): Payer: Medicare HMO | Attending: Cardiovascular Disease | Admitting: Cardiovascular Disease

## 2016-12-18 ENCOUNTER — Encounter: Payer: Self-pay | Admitting: Neurology

## 2016-12-18 ENCOUNTER — Ambulatory Visit (INDEPENDENT_AMBULATORY_CARE_PROVIDER_SITE_OTHER): Payer: Medicare HMO | Admitting: Neurology

## 2016-12-18 VITALS — Ht 66.0 in | Wt 212.0 lb

## 2016-12-18 VITALS — BP 160/85 | HR 83 | Wt 213.1 lb

## 2016-12-18 DIAGNOSIS — R0683 Snoring: Secondary | ICD-10-CM | POA: Diagnosis present

## 2016-12-18 DIAGNOSIS — I6521 Occlusion and stenosis of right carotid artery: Secondary | ICD-10-CM | POA: Diagnosis not present

## 2016-12-18 DIAGNOSIS — G5603 Carpal tunnel syndrome, bilateral upper limbs: Secondary | ICD-10-CM | POA: Diagnosis not present

## 2016-12-18 DIAGNOSIS — E669 Obesity, unspecified: Secondary | ICD-10-CM | POA: Insufficient documentation

## 2016-12-18 DIAGNOSIS — I10 Essential (primary) hypertension: Secondary | ICD-10-CM | POA: Diagnosis not present

## 2016-12-18 DIAGNOSIS — G478 Other sleep disorders: Secondary | ICD-10-CM

## 2016-12-18 DIAGNOSIS — Z79899 Other long term (current) drug therapy: Secondary | ICD-10-CM | POA: Diagnosis not present

## 2016-12-18 DIAGNOSIS — E119 Type 2 diabetes mellitus without complications: Secondary | ICD-10-CM | POA: Diagnosis not present

## 2016-12-18 DIAGNOSIS — G4731 Primary central sleep apnea: Secondary | ICD-10-CM | POA: Diagnosis not present

## 2016-12-18 DIAGNOSIS — G4733 Obstructive sleep apnea (adult) (pediatric): Secondary | ICD-10-CM | POA: Diagnosis not present

## 2016-12-18 DIAGNOSIS — I2510 Atherosclerotic heart disease of native coronary artery without angina pectoris: Secondary | ICD-10-CM | POA: Diagnosis not present

## 2016-12-18 DIAGNOSIS — E1159 Type 2 diabetes mellitus with other circulatory complications: Secondary | ICD-10-CM

## 2016-12-18 DIAGNOSIS — Z7982 Long term (current) use of aspirin: Secondary | ICD-10-CM | POA: Diagnosis not present

## 2016-12-18 DIAGNOSIS — I6302 Cerebral infarction due to thrombosis of basilar artery: Secondary | ICD-10-CM

## 2016-12-18 DIAGNOSIS — Z794 Long term (current) use of insulin: Secondary | ICD-10-CM | POA: Insufficient documentation

## 2016-12-18 DIAGNOSIS — Z9861 Coronary angioplasty status: Secondary | ICD-10-CM | POA: Diagnosis not present

## 2016-12-18 DIAGNOSIS — M5481 Occipital neuralgia: Secondary | ICD-10-CM | POA: Diagnosis not present

## 2016-12-18 DIAGNOSIS — R400 Somnolence: Secondary | ICD-10-CM

## 2016-12-18 HISTORY — DX: Occipital neuralgia: M54.81

## 2016-12-18 MED ORDER — CYCLOBENZAPRINE HCL 5 MG PO TABS: 5.0000 mg | ORAL_TABLET | Freq: Three times a day (TID) | ORAL | 0 refills | Status: DC

## 2016-12-18 NOTE — Patient Instructions (Addendum)
-   continue ASA and plavix and lipitor for stroke and cardiac prevention - continue neurontin for neuropathic pain. - will prescribe flexeril for neck pain and muscle relaxation - check BP and glucose at home - Follow up with your primary care physician for stroke risk factor modification. Recommend maintain blood pressure goal <130/80, diabetes with hemoglobin A1c goal below 6.5% and lipids with LDL cholesterol goal below 70 mg/dL.  - follow up with Dr. [name] and Dr. Allyson Sabal as scheduled - follow up as needed

## 2016-12-18 NOTE — Progress Notes (Signed)
STROKE NEUROLOGY FOLLOW UP NOTE  NAME: Johnathan Keith DOB: Nov 09, 1961  REASON FOR VISIT: stroke follow up HISTORY FROM: pt and chart  Today we had the pleasure of seeing Johnathan Keith in follow-up at our Neurology Clinic. Pt was accompanied by no one.   History Summary Johnathan Keith presents today as clinic follow up of stroke and right carotid stenosis > 70% in 2014. He had left hemiparesis in 2014 but CT and MRI did not show acute stroke, however, by my read, it is questionable a small DWI change at right pons without corresponding ADC attenuation. He has multiple stroke risk factors including HTN, DM, HLD, and right carotid stenosis.  He is on ASA and lipitor now for stroke prevention.  08/04/14 follow up - During the interval time, the patient has been doing fine without changes. He has seen his PCP and made changes for his DM regimen since his A1c was very high.    His right carotid stenosis > 70% a year ago without intervention, at that time his ICA/CCA ratio was 3.19, as compared to his left ICA/CCA ratio 1.02.  During the interval, we repeated CUS which showed right proximal/mid ICA >70% stenosis, the right ICA/CCA ratio 7.06 and left ICA/CCA ratio 0.95. The right ICA stenosis progressed from one year ago. He also complains that he has finger and toes tingling numbness as well as distal extremities cold with purple color from time to time.   11/09/14 follow up - he has followed up with Dr. [name] and had right CEA done on 09/26/14. Since then, he has intermittent chocking on eating solid food or drinking liquid. He is going to see GI next week. He also followed up with cardiology for low BP and his lisinopril decreased from 40mg  daily to 20mg  daily. He stated that his latest A1C check was 9.9 down from previous 12.3. His BP was well controlled, today in clinic 121/72.  He stated that he has migraine hx and could happen once a week, if not taking medications it may last 2 days. But if  takes Excedrin early in the course, it may last only 1hr after lying down and sleep. He continued to complains of tingling bilateral fingertips and toes. Denies smoking.    03/13/15 follow up - he was doing well. He stated that he can not stand too long otherwise he will have LBP, leg pain and neck pain and he has to lie down. He still has leg numbness and tingling. His sugar not in good control and around 200s due to insurance not covering new generation of DM meds which keeps his glucose at 150s. Now he has to go back to metformin and insulin. His BP better controlled, today 133/75. His right neck wound healed well and he is going to see Dr. [name] in 3 weeks and will do CUS again there. His swallow much better and only occasional chock with certain food.   09/12/15 follow up - he is doing the same. He had EMG/NCS with Dr. 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 The presence of active denervation at left thoracic paraspinals could also indicate more widespread central nervous system degenerative process, differentiation diagnosis also includes diabetic thoracic radiculopathies, nutritional deficiencies, inflammatory process. There was evidence of severe bilateral carpal tunnel syndromes." MRI lumbar and cervical spine done showed cervical radiculopathy but lack of lumbosacral radiculopathy. Recommended PT and prescribed gabapentin to him. He  stated that gabapentin helped him with pain but not get the numbness tingling away. He had right shoulder injections for shoulder pain and currently following with ortho to get knee injections too. He is yet to see ortho for carpel tunnel syndrome.   03/14/16 follow up - he is doing the somewhat better. No recurrent stroke like symptoms but still has LBP and sexual dysfunction. He follows with ortho for CTS and scheduled later for left CTS  surgery. Completed PT/OT and now at [facility] for self exercise. Has ? Macular degeneration, following with ophthal for retinal injection. BP 160/94.  Interval History During the interval time, pt has been doing well from stroke standpoint. Followed with cardiology and [facility] for carotid stenosis, repeat CUS in 10/2016 showed 1-39% bilaterally. Started in 11/2016 to have HA, at right back of head, with right side neck pain, tightness and dizziness feeling. No fall. Initially more painful, over time, it stabilized and improved but has not resolved. On DAPT and lipitor. BP still high at 160/85.   REVIEW OF SYSTEMS: Full 14 system review of systems performed and notable only for those listed below and in HPI above, all others are negative:  Constitutional:  Cardiovascular:  Leg swelling Ear/Nose/Throat: ear discharge, runny nose Skin: wounds, rash, itching  Eyes:  Eye discharge, eye itching, eye redness, light sensitivity, blurry vision Respiratory: SOB Gastroitestinal: constipation, diarrhea, incontinence of bowels Genitourinary: frequent urination Hematology/Lymphatic: N/A  Endocrine: excessive thirst  Musculoskeletal: joint pain, back pain, muscle cramps, walking difficulty, neck pain, neck stiffness  Allergy/Immunology: frequent infections  Neurological: memory loss, dizziness, numbness, weakness Psychiatric:  Sleep: apnea, frequent waking, daytime sleepiness  The following represents the patient's updated allergies and side effects list: No Known Allergies  The neurologically relevant items on the patient's problem list were reviewed on today's visit.  Neurologic Examination  A problem focused neurological exam (12 or more points of the single system neurologic examination, vital signs counts as 1 point, cranial nerves count for 8 points) was performed.  Blood pressure (!) 160/85, pulse 83, weight 213 lb 1.6 oz (96.7 kg).  General - Well nourished, well developed, in no apparent distress.    Ophthalmologic - Sharp disc margins OU.  Cardiovascular - Regular rate and rhythm with no murmur.   Neck - supple, no nuchal rigidity. Carotid no bruits. Mild right occipital neuralgia on palpation.  Mental Status -  Level of arousal and orientation to time, place, and person were intact.  Language including expression, naming, repetition, comprehension was assessed and found intact.  Attention span and concentration were normal.  Recent and remote memory were intact.  Fund of Knowledge was assessed and was intact.  Cranial Nerves II - XII -  II - Vision intact OU.  III, IV, VI - Extraocular movements intact.  V - Facial sensation decreased on the left.  VII - Facial movement intact bilaterally.  VIII - Hearing & vestibular intact bilaterally.  X - Palate elevates symmetrically.  XI - Chin turning & shoulder shrug intact bilaterally.  XII - Tongue protrusion intact.  Motor Strength - The patient's strength was normal in all extremities and pronator drift was absent. Bulk was normal and fasciculations were absent.  Motor Tone - Muscle tone was assessed at the neck and appendages and was normal.  Reflexes - The patient's reflexes were normal in all extremities and he had no pathological reflexes.  Sensory - Light touch, temperature/pinprick were assessed and were decreased on left UE.  Coordination - The patient  had normal movements in the hands and feet with no ataxia or dysmetria. Tremor was absent.  Gait and Station - broad based gait, walk without cane, hemiparetic gait  Images and labs:  I have personally reviewed the radiological images below and agree with the radiology interpretations.  CTA neck - Atherosclerotic disease in the right carotid bifurcation. 15 mm above the bifurcation, focal severe stenosis measuring 75% diameter stenosis Mild atherosclerotic disease left carotid bifurcation without significant stenosis Both vertebral arteries widely patent.  CUS - 80-99%  right ICA stenosis.  2D echo - - Left ventricle: The cavity size was normal. Wall thickness was increased in a pattern of mild LVH. Systolic function was normal. The estimated ejection fraction was in the range of 55% to 60%. Wall motion was normal; there were no regional wall motion abnormalities. Doppler parameters are consistent with abnormal left ventricular relaxation (grade 1 diastolic dysfunction). The E/e&' ratio is >15, suggesting elevated LV filling pressure. - Aortic valve: Sclerosis without stenosis. There was trace to mild regurgitation. - Left atrium: LA volume/ BSA = 20.7 ml/m2. The atrium was normal in size. - Tricuspid valve: There was trivial regurgitation. - Pulmonary arteries: PA peak pressure: 24 mm Hg (S). Impressions: - LVEF 55-60%, mild LVH, normal wall motion, diastolic dysfunction, elevated LV filling pressure, normal LA size, aortic valve sclerosis with trace to mild AI.  MRI C- spine: 1. Left paramedian disc herniation superimposed on broader disc protrusion at C6-C7 causing severe left neural foraminal narrowing that could lead to left C7 nerve root compression. 2. Small right paramedian disc protrusion at C7-T1 causing mild to moderate right foraminal narrowing with no nerve root impingement noted. 3. Milder degenerative changes at C3-C4 and C5-C6 do not lead to any foraminal narrowing or nerve root impingement.  MRI L spine: This is a borderline normal age-appropriate MRI showing minimal degenerative changes at T12-L1 and L5-S1 that did not lead to any nerve root impingement.   EMG - This is an abnormal study. 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 The presence of active denervation at left thoracic paraspinals could also indicate more widespread central nervous system degenerative process,  differentiation diagnosis also includes diabetic thoracic radiculopathies, nutritional deficiencies, inflammatory process. There was evidence of severe bilateral carpal tunnel syndromes.   Component     Latest Ref Rng 07/27/2014  Cholesterol, Total     100 - 199 mg/dL 562  Triglycerides     0 - 149 mg/dL 130 (H)  HDL     >86 mg/dL 30 (L)  VLDL Cholesterol Cal     5 - 40 mg/dL 42 (H)  LDL (calc)     0 - 99 mg/dL 84  Total CHOL/HDL Ratio     0.0 - 5.0 ratio units 5.2 (H)  TSH     0.450 - 4.500 uIU/mL 2.860  Free T4     0.82 - 1.77 ng/dL 5.78  Hgb I6N MFr Bld     4.8 - 5.6 % 12.3 (H)  Est. average glucose Bld gHb Est-mCnc      306    Assessment: As you may recall, he is a 56 y.o. Caucasian male with PMH of HTN, HLD, DM and right ICA stenosis followed up in clinic today for stroke and right ICA stenosis. He had left hemiparesis in 2014 but CT and MRI did not show acute stroke, however, by my read, it is questionable a small DWI  change at right pons without corresponding ADC attenuation. Regardless, he has multiple stroke risk factors including HTN, DM, HLD, and right carotid stenosis. He is on ASA, plavix and lipitor now for stroke and cardiac prevention. He also has right carotid stenosis and had right CEA by Dr. [name]. Followed with [facility] and repeat CUS no significant stenosis. EMG and MRI evaluation showed possible lumbar and cervical radiculopathy, completed PT/OT. He also has severe carpel tunnel syndrome and following with ortho, and was on wrist brace but now not doing repetitive motion and symptoms much better, no surgery was pursued. Developed right mild occipital neuralgia, will prescribe flexeril. He refused occipital nerve block. BP still mildly high.  Plan:  - continue ASA and plavix and lipitor for stroke and cardiac prevention - continue neurontin for neuropathic pain. - prescribe flexeril for neck pain and muscle relaxation - check BP and glucose at home - Follow up with  your primary care physician for stroke risk factor modification. Recommend maintain blood pressure goal <130/80, diabetes with hemoglobin A1c goal below 6.5% and lipids with LDL cholesterol goal below 70 mg/dL.  - follow up with Dr. [name] and Dr. Allyson Sabal as scheduled - follow up as needed  I spent more than 25 minutes of face to face time with the patient. Greater than 50% of time was spent in counseling and coordination of care.  No orders of the defined types were placed in this encounter.  Meds ordered this encounter  Medications  . cyclobenzaprine (FLEXERIL) 5 MG tablet    Sig: Take 1 tablet (5 mg total) by mouth 3 (three) times daily.    Dispense:  21 tablet    Refill:  0    Patient Instructions  - continue ASA and plavix and lipitor for stroke and cardiac prevention - continue neurontin for neuropathic pain. - will prescribe flexeril for neck pain and muscle relaxation - check BP and glucose at home - Follow up with your primary care physician for stroke risk factor modification. Recommend maintain blood pressure goal <130/80, diabetes with hemoglobin A1c goal below 6.5% and lipids with LDL cholesterol goal below 70 mg/dL.  - follow up with Dr. [name] and Dr. Allyson Sabal as scheduled - follow up as needed   Marvel Plan, MD PhD [facility] [address], [address] [place], [place] [address] [phone]

## 2016-12-21 ENCOUNTER — Other Ambulatory Visit: Payer: Self-pay | Admitting: Neurology

## 2016-12-21 DIAGNOSIS — M5481 Occipital neuralgia: Secondary | ICD-10-CM

## 2016-12-24 ENCOUNTER — Other Ambulatory Visit: Payer: Self-pay

## 2016-12-24 DIAGNOSIS — M5481 Occipital neuralgia: Secondary | ICD-10-CM

## 2016-12-24 MED ORDER — CYCLOBENZAPRINE HCL 5 MG PO TABS: 5.0000 mg | ORAL_TABLET | Freq: Three times a day (TID) | ORAL | 0 refills | Status: DC

## 2016-12-24 NOTE — Telephone Encounter (Signed)
Pt called to advise medication did work, he may not need to take it 3 x day. He said he is sleeping more. Please call at [phone] or can wife at [phone]

## 2017-01-08 NOTE — Procedures (Signed)
Patient Name: Johnathan Keith, Johnathan Keith Date: 12/18/2016 Gender: Male D.O.B: [birth year] Age (years): 31 Referring Provider: Shelva Majestic MD, ABSM Height (inches): 75 Interpreting Physician: Shelva Majestic MD, ABSM Weight (lbs): 212 RPSGT: Laren Everts BMI: 34 MRN: [id number] Neck Size: 15.50  CLINICAL INFORMATION Sleep Study Type: Split Night CPAP  Indication for sleep study: Diabetes, Excessive Daytime Sleepiness, Fatigue, Hypertension, Obesity, Re-Evaluation, Snoring, Witnessed Apneas  Epworth Sleepiness Score: 10  SLEEP STUDY TECHNIQUE As per the AASM Manual for the Scoring of Sleep and Associated Events v2.3 (April 2016) with a hypopnea requiring 4% desaturations.  The channels recorded and monitored were frontal, central and occipital EEG, electrooculogram (EOG), submentalis EMG (chin), nasal and oral airflow, thoracic and abdominal wall motion, anterior tibialis EMG, snore microphone, electrocardiogram, and pulse oximetry. Continuous positive airway pressure (CPAP) was initiated when the patient met split night criteria and was titrated according to treat sleep-disordered breathing.  MEDICATIONS aspirin 81 MG chewable tablet atorvastatin (LIPITOR) 40 MG tablet Cholecalciferol (VITAMIN D) 2000 UNITS tablet clopidogrel (PLAVIX) 75 MG tablet cyclobenzaprine (FLEXERIL) 5 MG tablet dapagliflozin propanediol (FARXIGA) 10 MG TABS tablet dicyclomine (BENTYL) 10 MG capsule diphenoxylate-atropine (LOMOTIL) 2.5-0.025 MG tablet furosemide (LASIX) 40 MG tablet gabapentin (NEURONTIN) 100 MG capsule glimepiride (AMARYL) 4 MG tablet Insulin Detemir (LEVEMIR FLEXTOUCH) 100 UNIT/ML Pen isosorbide mononitrate (IMDUR) 30 MG 24 hr tablet (Expired) loperamide (IMODIUM) 2 MG capsule metFORMIN (GLUCOPHAGE-XR) 500 MG 24 hr tablet metoprolol succinate (TOPROL-XL) 25 MG 24 hr tablet nitroGLYCERIN (NITROSTAT) 0.4 MG SL tablet pantoprazole (PROTONIX) 40 MG tablet Polyethyl Glycol-Propyl  Glycol (SYSTANE OP) tamsulosin (FLOMAX) 0.4 MG CAPS capsule  Medications self-administered by patient taken the night of the study : BENTYL, LOMOTIL, GABAPENTIN, LEVEMIR, FLUTICASONE, RANITIDINE  RESPIRATORY PARAMETERS Diagnostic  Total AHI (/hr): 54.3 RDI (/hr): 56.7 OA Index (/hr): 27.8 CA Index (/hr): 1.6 REM AHI (/hr): 82.5 NREM AHI (/hr): 50.9 Supine AHI (/hr): 54.3 Non-supine AHI (/hr): N/A Min O2 Sat (%): 66.00 Mean O2 (%): 90.34 Time below 88% (min): 35.8   Titration  Optimal Pressure (cm): 22 AHI at Optimal Pressure (/hr): 48.9 Min O2 at Optimal Pressure (%): 91.0 Supine % at Optimal (%): 100 Sleep % at Optimal (%): 82    SLEEP ARCHITECTURE The recording time for the entire night was 398.4 minutes.  During a baseline period of 161.0 minutes, the patient slept for 149.1 minutes in REM and nonREM, yielding a sleep efficiency of 92.6%. Sleep onset after lights out was 2.0 minutes with a REM latency of 82.0 minutes. The patient spent 11.74% of the night in stage N1 sleep, 77.53% in stage N2 sleep, 0.00% in stage N3 and 10.73% in REM.  During the titration period of 227.8 minutes, the patient slept for 171.5 minutes in REM and nonREM, yielding a sleep efficiency of 75.3%. Sleep onset after CPAP initiation was 13.4 minutes with a REM latency of 80.0 minutes. The patient spent 31.20% of the night in stage N1 sleep, 54.52% in stage N2 sleep, 0.00% in stage N3 and 14.29% in REM.  CARDIAC DATA The 2 lead EKG demonstrated sinus rhythm. The mean heart rate was 70.29 beats per minute. Other EKG findings include: None.  LEG MOVEMENT DATA The total Periodic Limb Movements of Sleep (PLMS) were 35. The PLMS index was 6.51 .  IMPRESSIONS - Severe obstructive sleep apnea occurred during the diagnostic portion of the study (AHI = 54.3/hour). CPAP was initiated at 5 cm water pressure and was initially titrated up to 15 cm water pressure. BiPAP  18/14 was instituted but the AHI was 61.2 with 10  central events with bilevel pressure.  The patient was reverted back to CPAP 17 with further titration to 22 meter pressure.  AHI remain elevated throughout all pressures, and there were central events noted throughout the study. AHI at 22 cm was 48.9/h.   - Mild central sleep apnea occurred during the diagnostic portion of the study (CAI = 1.6/hour). - Severe oxygen desaturation  to a nadir of 66.00%. - The patient snored with Moderate snoring volume during the diagnostic portion of the study. - No cardiac abnormalities were noted during this study. - Clinically significant periodic limb movements did not occur during sleep.  DIAGNOSIS - Obstructive Sleep Apnea (327.23 [G47.33 ICD-10]) - Central Sleep Apnea  RECOMMENDATIONS - In this patient with normal LV function, frequent central events throughout the CPAP titration, and central events noted at bilevel 18/14, recommend Adapt Servo Ventilation titration.   - Effort should be made to optimize nasal and oropharyngeal patency - Avoid alcohol, sedatives and other CNS depressants that may worsen sleep apnea and disrupt normal sleep architecture. - Sleep hygiene should be reviewed to assess factors that may improve sleep quality. - Weight management and regular exercise should be initiated or continued.  [Electronically signed] 01/08/2017 09:27 PM  Shelva Majestic MD, [facility], ABSM Diplomate, American Board of Sleep Medicine   NPI: [id number] PH: [phone]   FX: [phone] [place]

## 2017-01-16 ENCOUNTER — Other Ambulatory Visit: Payer: Self-pay | Admitting: *Deleted

## 2017-01-16 ENCOUNTER — Telehealth: Payer: Self-pay | Admitting: *Deleted

## 2017-01-16 DIAGNOSIS — G4733 Obstructive sleep apnea (adult) (pediatric): Secondary | ICD-10-CM

## 2017-01-16 NOTE — Telephone Encounter (Signed)
Patient notified of sleep study results and recommendations. ASV titration study ordered.

## 2017-01-16 NOTE — Progress Notes (Signed)
Patient notified

## 2017-01-16 NOTE — Telephone Encounter (Signed)
-----   Message from Lennette Bihari, MD sent at 01/08/2017  9:33 PM EST ----- Johnathan Keith, please notify patient that he has severe sleep apnea.  Optimal titration was not achieved with CPAP and even when they tried BiPAP.  Please schedule the patient for an adaptive servo ventilation titration (ASV) as soon as possible since he has severe sleep apnea.

## 2017-01-22 ENCOUNTER — Ambulatory Visit (HOSPITAL_BASED_OUTPATIENT_CLINIC_OR_DEPARTMENT_OTHER): Payer: Medicare HMO | Attending: Cardiovascular Disease | Admitting: Cardiovascular Disease

## 2017-01-22 VITALS — Ht 66.0 in | Wt 209.0 lb

## 2017-01-22 DIAGNOSIS — G4733 Obstructive sleep apnea (adult) (pediatric): Secondary | ICD-10-CM | POA: Diagnosis not present

## 2017-01-22 DIAGNOSIS — G4736 Sleep related hypoventilation in conditions classified elsewhere: Secondary | ICD-10-CM | POA: Diagnosis not present

## 2017-02-04 NOTE — Procedures (Signed)
Patient Name: Johnathan Keith, Johnathan Keith Date: 01/22/2017 Gender: Male D.O.B: April 05, 1961 Age (years): 60 Referring Provider: Nicki Guadalajara MD, ABSM Height (inches): 66 Interpreting Physician: Nicki Guadalajara MD, ABSM Weight (lbs): 212 RPSGT: Rolene Arbour BMI: 34 MRN: [id number] Neck Size: 15.50   CLINICAL INFORMATION The patient is referred for a adaptive servo-ventilator titration study. Most recent polysomnogram dated 12/18/2016:   AHI of 54.3/h and RDI of 56.7/h.  CPAP was initiated at 5 cm water pressure and was initially titrated up to 15 cm water pressure. BiPAP 18/14 was instituted but the AHI was 61.2 with 10 central events with bilevel pressure.  The patient was reverted back to CPAP 17 with further titration to 22 cm pressure.  AHI remained elevated throughout all pressures, and there were central events noted throughout the study. AHI at 22 cm was 48.9/h.   Oxygen nadir 66%.  MEDICATIONS aspirin 81 MG chewable tablet     atorvastatin (LIPITOR) 40 MG tablet    Cholecalciferol (VITAMIN D) 2000 UNITS tablet    clopidogrel (PLAVIX) 75 MG tablet    cyclobenzaprine (FLEXERIL) 5 MG tablet    dapagliflozin propanediol (FARXIGA) 10 MG TABS tablet    dicyclomine (BENTYL) 10 MG capsule    diphenoxylate-atropine (LOMOTIL) 2.5-0.025 MG tablet    furosemide (LASIX) 40 MG tablet    gabapentin (NEURONTIN) 100 MG capsule    glimepiride (AMARYL) 4 MG tablet    Insulin Detemir (LEVEMIR FLEXTOUCH) 100 UNIT/ML Pen    isosorbide mononitrate (IMDUR) 30 MG 24 hr tablet(Expired)    loperamide (IMODIUM) 2 MG capsule    metFORMIN (GLUCOPHAGE-XR) 500 MG 24 hr tablet    metoprolol succinate (TOPROL-XL) 25 MG 24 hr tablet    nitroGLYCERIN (NITROSTAT) 0.4 MG SL tablet    pantoprazole (PROTONIX) 40 MG tablet    Polyethyl Glycol-Propyl Glycol (SYSTANE OP)    tamsulosin (FLOMAX) 0.4 MG CAPS capsule     Medications self-administered by patient taken the night of the study : BENTYL, LOMOTIL,  GABAPENTIN, LEVEMIR, FLUTICASONE, RANITIDINE, CYCLOBENZAPRINE HCL, IMMODIUM, LIPITOR  SLEEP STUDY TECHNIQUE As per the AASM Manual for the Scoring of Sleep and Associated Events v2.3 (April 2016) with a hypopnea requiring 4% desaturations.  The channels recorded and monitored were frontal, central and occipital EEG, electrooculogram (EOG), submentalis EMG (chin), nasal and oral airflow, thoracic and abdominal wall motion, anterior tibialis EMG, snore microphone, electrocardiogram, and pulse oximetry.  RESPIRATORY PARAMETERS Optimal Min IPAP (cm): 5 Optimal Max IPAP (cm): 8 Optimal Min EPAP (cm): 5 Optimal Max EPAP (cm): 8 Optimal Max Pressure (cm): 15 Optimal Min PS (cm): 4 Optimal Max PS (cm): 15 Opitmal Breathing Rate (/min): Auto Overall Min O2 (%): 70.00 Min O2 at Optimal Pressure (%): 0.00 AHI at Optimal (/hr): N/A   SLEEP ARCHITECTURE During a recording time of 390.8 minutes, the patient slept for 265.5 minutes. Sleep efficiency was 67.9%. The patient spent 6.21% of the night in stage N1 sleep, 74.95% in stage N2 sleep, 0.00% in stage N3 and 18.83% in REM. Wake after sleep onset (WASO) was 96.9 minutes. Alpha intrusion was PRESENT ABSENT. Supine sleep was 38.60%. The arousal index was 7.9.  LEG MOVEMENT DATA PLM Index (/hr):  PLM Arousal Index (/hr): 0.0   CARDIAC DATA The 2 lead EKG demonstrated sinus rhythm. The mean heart rate was 84.41 beats per minute. Other EKG findings include: None.  IMPRESSIONS - Mild obstructive sleep apnea occurred during this study (AHI = 8.1/hour). An optimal ASV pressure was selected. - There is  complete resolution of prior central events; no significant central sleep apnea occurred during this study (CAI = 0.0). - Significant oxygen desaturation to a nadir of 70%. - The patient snored for % of sleep with Moderate snoring volume. - No cardiac abnormalities were noted during this study. - Clinically significant periodic limb movements did not occur  during sleep.  DIAGNOSIS - Obstructive Sleep Apnea (327.23 [G47.33 ICD-10]) - Nocturnal Hypoxemia (327.26 [G47.36 ICD-10])  RECOMMENDATIONS - Recommend an initial trial of ASV with an EPAP Min 5 and Max 8 cmH2O, Pressure Support Min 4 and Max 15 cm H2O,  and Max Pressure of 15 cm H2O and Breath Rate of Auto BrPM - Avoid alcohol, sedatives and other CNS depressants that may worsen sleep apnea and disrupt normal sleep architecture. - Consider nocturnal oxygen saturation study on ASV. - Sleep hygiene should be reviewed to assess factors that may improve sleep quality. - Weight management and regular exercise should be initiated or continued. - Recommend a download be obtained in 30 days and sleep clinic evaluation after 4 weeks of therapy  [Electronically signed] 02/04/2017 07:49 PM  Nicki Guadalajara MD, [facility], ABSM Diplomate, American Board of Sleep Medicine   NPI: [id number] [facility] PH: [phone]   FX: [phone] ACCREDITED BY THE AMERICAN ACADEMY OF SLEEP MEDICINE

## 2017-02-18 ENCOUNTER — Ambulatory Visit: Payer: Medicare HMO | Admitting: Cardiovascular Disease

## 2017-02-20 ENCOUNTER — Telehealth: Payer: Self-pay | Admitting: Cardiovascular Disease

## 2017-02-20 NOTE — Telephone Encounter (Signed)
New message    Johnathan Keith from [facility] is calling to find out if prescription was sent to vendor for pt to get his cpap machine. She is asking which vendor was called, so nothing is done twice.

## 2017-02-21 ENCOUNTER — Telehealth: Payer: Self-pay | Admitting: *Deleted

## 2017-02-21 NOTE — Telephone Encounter (Signed)
Follow Up   Pt calling back about the sleep study

## 2017-02-21 NOTE — Telephone Encounter (Signed)
Left message for patient to return a call to me regarding the cancellation of the sleep study scheduled on 03/02/17.

## 2017-02-25 ENCOUNTER — Telehealth: Payer: Self-pay | Admitting: *Deleted

## 2017-02-25 NOTE — Telephone Encounter (Signed)
Returned a call ans spoke wit patient's wife. She wanted to know if the [employer] company is in network with the [employer]. I told her that I was told when asked by sharon at [facility] that they are in network. Contact information given to patient's wife to call [facility] to confirm.

## 2017-02-25 NOTE — Telephone Encounter (Signed)
Faxed records and order for ASV machine to [facility].

## 2017-02-27 ENCOUNTER — Telehealth: Payer: Self-pay | Admitting: *Deleted

## 2017-02-27 ENCOUNTER — Encounter: Payer: Self-pay | Admitting: Cardiology

## 2017-02-27 ENCOUNTER — Ambulatory Visit (INDEPENDENT_AMBULATORY_CARE_PROVIDER_SITE_OTHER): Payer: Medicare HMO | Admitting: Cardiology

## 2017-02-27 ENCOUNTER — Ambulatory Visit: Payer: Medicare Other | Admitting: Cardiovascular Disease

## 2017-02-27 VITALS — BP 178/106 | HR 81 | Ht 66.0 in | Wt 212.2 lb

## 2017-02-27 DIAGNOSIS — I63031 Cerebral infarction due to thrombosis of right carotid artery: Secondary | ICD-10-CM

## 2017-02-27 DIAGNOSIS — I10 Essential (primary) hypertension: Secondary | ICD-10-CM

## 2017-02-27 DIAGNOSIS — G4730 Sleep apnea, unspecified: Secondary | ICD-10-CM | POA: Diagnosis not present

## 2017-02-27 DIAGNOSIS — I6521 Occlusion and stenosis of right carotid artery: Secondary | ICD-10-CM | POA: Diagnosis not present

## 2017-02-27 DIAGNOSIS — I2510 Atherosclerotic heart disease of native coronary artery without angina pectoris: Secondary | ICD-10-CM | POA: Diagnosis not present

## 2017-02-27 DIAGNOSIS — Z9861 Coronary angioplasty status: Secondary | ICD-10-CM

## 2017-02-27 DIAGNOSIS — E7849 Other hyperlipidemia: Secondary | ICD-10-CM

## 2017-02-27 DIAGNOSIS — E784 Other hyperlipidemia: Secondary | ICD-10-CM | POA: Diagnosis not present

## 2017-02-27 MED ORDER — METOPROLOL SUCCINATE ER 25 MG PO TB24: 25.0000 mg | ORAL_TABLET | Freq: Every day | ORAL | 11 refills | Status: DC

## 2017-02-27 NOTE — Telephone Encounter (Signed)
Patient referred to [facility] for ASV set up. Previously referred to [facility] but they are out of network.

## 2017-02-27 NOTE — Assessment & Plan Note (Signed)
Rt CEA Oct 2015-Dr [name]

## 2017-02-27 NOTE — Assessment & Plan Note (Signed)
PCP follows 

## 2017-02-27 NOTE — Progress Notes (Signed)
02/27/2017 Johnathan Keith   06-25-1961  [id number]  Primary Physician Shelle Iron, MD Primary Cardiologist: Dr Allyson Sabal  HPI:  56 y/o male followed by Dr Allyson Sabal with a history of (?) CAD, DM, HTN, HLD, TIA, OSA, and PVD, s/p RCEA 2015. He had a low risk Myoview at [facility] in July 2017. He is in the office today for routine follow up. He has had labile HTN and self adjusts his medications. He is a little anxious and has multiple complaints. He has not been able to get his C-pap yet. He is not having chest pain. He has chronic DOE. Echo Sept 2017 showed normal LVF, mild LVH, grade 1 DD.    Current Outpatient Prescriptions  Medication Sig Dispense Refill  . aspirin 81 MG chewable tablet Chew 1 tablet (81 mg total) by mouth daily.    Marland Kitchen atorvastatin (LIPITOR) 40 MG tablet TAKE 1 TABLET ONCE DAILY. 30 tablet 1  . Cholecalciferol (VITAMIN D) 2000 UNITS tablet Take 2,000 Units by mouth daily.    . clopidogrel (PLAVIX) 75 MG tablet Take 1 tablet (75 mg total) by mouth daily with breakfast. 90 tablet 2  . cyclobenzaprine (FLEXERIL) 5 MG tablet Take 1 tablet (5 mg total) by mouth 3 (three) times daily. 90 tablet 0  . dapagliflozin propanediol (FARXIGA) 10 MG TABS tablet     . dicyclomine (BENTYL) 10 MG capsule Take 10 mg by mouth 3 (three) times daily before meals.     . diphenoxylate-atropine (LOMOTIL) 2.5-0.025 MG tablet Take 1 tablet by mouth daily as needed for diarrhea or loose stools.    . fluticasone (FLONASE) 50 MCG/ACT nasal spray Place 1 spray into both nostrils daily as needed.    . furosemide (LASIX) 40 MG tablet Take 40 mg by mouth daily as needed for fluid.     Marland Kitchen gabapentin (NEURONTIN) 100 MG capsule TAKE 1 CAPSULE THREE TIMES DAILY. 90 capsule 6  . glimepiride (AMARYL) 4 MG tablet Take 4 mg by mouth 2 (two) times daily.    . Insulin Detemir (LEVEMIR FLEXTOUCH) 100 UNIT/ML Pen Inject 60 Units into the skin daily at 10 pm.     . loperamide (IMODIUM) 2 MG capsule Take by mouth daily as needed  for diarrhea or loose stools.    . metFORMIN (GLUCOPHAGE-XR) 500 MG 24 hr tablet Take 500 mg by mouth 2 (two) times daily.    . metoprolol succinate (TOPROL-XL) 25 MG 24 hr tablet Take 1 tablet (25 mg total) by mouth daily. 30 tablet 11  . nitroGLYCERIN (NITROSTAT) 0.4 MG SL tablet Place 1 tablet (0.4 mg total) under the tongue every 5 (five) minutes as needed for chest pain. 25 tablet 2  . pantoprazole (PROTONIX) 40 MG tablet Take 40 mg by mouth daily.    Bertram Gala Glycol-Propyl Glycol (SYSTANE OP) Place 1 drop into both eyes 2 (two) times daily.    . ranitidine (ZANTAC) 300 MG tablet Take 1 tablet by mouth at bedtime.    . tamsulosin (FLOMAX) 0.4 MG CAPS capsule Take 1 capsule (0.4 mg total) by mouth daily. 30 capsule 1  . isosorbide mononitrate (IMDUR) 30 MG 24 hr tablet Take 0.5 tablets (15 mg total) by mouth daily. 45 tablet 3   No current facility-administered medications for this visit.     No Known Allergies  Past Medical History:  Diagnosis Date  . Chronic neck and back pain   . Coronary artery disease    drug-eluting stents placed in proximal and mid LAD  .  Deviated septum   . Diabetic retinopathy (HCC)   . GERD (gastroesophageal reflux disease)    "occasionally" (03/23/2015)  . Headache    "more than 2/wk" (03/23/2015)  . Heart attack    "in 1997 was told I had signs of a small heart attack that I didn't know I'd had"  . High cholesterol   . History of gout    "when I was younger"  . Hypertension   . Kidney stones   . Migraine    "had my 1st and only in 12/2014" (03/23/2015)  . Peripheral vascular disease (HCC)    carotid artery disease  . PONV (postoperative nausea and vomiting)    "was told I was confused and combative in recovery from the narcotics w/my carotid OR"  . Sleep apnea    "severe; couldn't afford mask" (03/23/2015)  . Stroke [facility]) 2014   "on walker for 2 months; left side is weaker and numb since" (03/23/2015)  . Type II diabetes mellitus (HCC)      Social History   Social History  . Marital status: Married    Spouse name: N/A  . Number of children: 5  . Years of education: 12th   Occupational History  . Disabled    Social History Main Topics  . Smoking status: Never Smoker  . Smokeless tobacco: Never Used  . Alcohol use No  . Drug use: No  . Sexual activity: Not Currently    Partners: Female   Other Topics Concern  . Not on file   Social History Narrative   Patient is married with 5 children.    Patient is right handed.   Patient has hs education.   Patient drinks 4 12 oz can sodas daily.     Family History  Problem Relation Age of Onset  . Heart disease Mother   . Diabetes Mother   . Hyperlipidemia Mother   . Hypertension Mother   . Cancer Father     Lung Cancer   . Heart disease Father   . Heart attack Father   . Gout Brother   . Asthma Brother   . Hyperlipidemia Brother   . Hypertension Brother   . Diabetes Brother   . Cancer Brother     colon  . Stroke Paternal Grandfather   . Colon cancer Sister   . Hyperlipidemia Sister   . Hypertension Sister   . Gout Maternal Uncle   . Diabetes Maternal Grandfather   . Diabetes Daughter      Review of Systems: General: negative for chills, fever, night sweats or weight changes.  Cardiovascular: negative for chest pain, orthopnea, palpitations, paroxysmal nocturnal dyspnea Dermatological: negative for rash Respiratory: negative for cough or wheezing Urologic: negative for hematuria Abdominal: negative for nausea, vomiting, diarrhea, bright red blood per rectum, melena, or hematemesis Neurologic: negative for visual changes, syncope, or dizziness All other systems reviewed and are otherwise negative except as noted above.    Blood pressure (!) 178/106, pulse 81, height 5\' 6"  (1.676 m), weight 212 lb 3.2 oz (96.3 kg).  General appearance: alert, cooperative, no distress and anxious, unshaven, poor dentition Neck: no carotid bruit and no  JVD Lungs: clear to auscultation bilaterally Heart: regular rate and rhythm Extremities: extremities normal, atraumatic, no cyanosis or edema Skin: Skin color, texture, turgor normal. No rashes or lesions Neurologic: Grossly normal  EKG NSR-HR 81  ASSESSMENT AND PLAN:   Essential hypertension Labile B/P. Repeat 152/82 by me. He adjusts his own medications.  CAD -  S/P PCI-2016 LAD DES 2016. Admitted to [facility] Sept 2017 with SOB, not felt to be in CHF, Myoview was negative for sichemia  CVA (cerebral vascular accident) (HCC) Questionable small stroke in 2014 with transient Lt sided weakness in setting of carotid disease.  Carotid artery stenosis- Rt CEA Oct 2015-Dr Chen  Hyperlipidemia PCP follows  Sleep apnea He is having logistical problems getting his C-pap   PLAN  I suggested he take his Toprol daily and take his Lasix prn for edema. His B/P is poorly controlled. I told him to record his B/P and HR at home daily for two weeks then return to see a pharmacist in our B/P clinic.   Corine Shelter PA-C 02/27/2017 12:30 PM

## 2017-02-27 NOTE — Assessment & Plan Note (Signed)
Labile B/P. Repeat 152/82 by me. He adjusts his own medications.

## 2017-02-27 NOTE — Assessment & Plan Note (Signed)
Questionable small stroke in 2014 with transient Lt sided weakness in setting of carotid disease.

## 2017-02-27 NOTE — Patient Instructions (Signed)
Johnathan Keith, [place], has recommended making the following medication changes: 1. TAKE Metoprolol DAILY  Your physician has requested that you regularly monitor and record your blood pressure readings at home. Please use the same machine at different times during the day to check your readings. Please keep a record of your blood pressures and heart rates for 2 weeks. Bring the record to your appointment with our pharmacy staff.  Your physician recommends that you schedule a follow-up appointment in 2 weeks with one of our clinical pharmacists. Bring you blood pressure cuff with you to this appointment.  Johnathan Keith recommends that you schedule a follow-up appointment in 6 months with Dr Allyson Sabal. You will receive a reminder letter in the mail two months in advance. If you don't receive a letter, please call our office to schedule the follow-up appointment.  If you need a refill on your cardiac medications before your next appointment, please call your pharmacy.

## 2017-02-27 NOTE — Assessment & Plan Note (Signed)
He is having logistical problems getting his C-pap

## 2017-02-27 NOTE — Assessment & Plan Note (Signed)
LAD DES 2016. Admitted to [facility] Sept 2017 with SOB, not felt to be in CHF, Myoview was negative for sichemia

## 2017-03-02 ENCOUNTER — Encounter (HOSPITAL_BASED_OUTPATIENT_CLINIC_OR_DEPARTMENT_OTHER): Payer: Medicare HMO

## 2017-03-04 ENCOUNTER — Telehealth: Payer: Self-pay | Admitting: Cardiology

## 2017-03-04 ENCOUNTER — Telehealth: Payer: Self-pay | Admitting: Cardiovascular Disease

## 2017-03-04 NOTE — Telephone Encounter (Signed)
New message    Pt is calling to ask about a machine he is supposed to have.

## 2017-03-04 NOTE — Telephone Encounter (Signed)
Returned call to discuss with wife, and informed that the faxed order for pt's bipap was sent to [facility] [facility]. Gave her their contact information for [place] office. She voiced understanding and thanks.

## 2017-03-04 NOTE — Telephone Encounter (Signed)
Viviann Spare from [facility] is calling to verify the order for pressure change in patient supplies, please call at [phone] to verify. Thanks.

## 2017-03-16 NOTE — Progress Notes (Signed)
Patient ID: Johnathan Keith                 DOB: [birth year]                      MRN: [id number]     HPI: Johnathan Keith is a 56 y.o. male patient of Dr Allyson Sabal referred by Corine Shelter PA-C to HTN clinic.  PMH includes CAD, DM, HTN, HLD, TIA, OSA, and PVD, s/p RCEA 2015. He has had labile HTN and self-adjusts his medications. Echo Sept 2017 showed normal LVF, mild LVH, grade 1 DD.   Patient presents today for HTN evaluation and medication management. Denies dizziness, headaches, swelling, fatigue or ADRs.  Also reports his vision becomes a blurry once his systolic BP ~110 and noted his BP improves every time he exercise/walks  Current HTN meds:  metoprolol succinate 25mg  daily (every morning) Isosorbide mononotrate 30mg  daily (1/2 tablet daily) (every morning) Furosemide 40mg  daily and 80mg  alternating (every morning)  Previously tried:  Lisinopril 10-40mg  daily - stopped due to liable BP   BP goal: 130/80  Family History: reports heart disease form mother and father, hypertension and diabeted from mother, father, brother, ans sister.  Social History: denies smoking, smokeless tobacco, alcohol use or other drugs  Diet: no table salt, avoiding high potassium vegetables, mainly eating frozen vegetables and meat, caffeine freed diet drinks  Exercise: up and down steps at home, and walking while at [employer]   Home BP readings:13 readings; 144/ 86 average (range 116-173/73-100) (ALL morning reading before taking medication) **OMRON home cuff/device determined to be accurate within **  Wt Readings from Last 3 Encounters:  02/27/17 212 lb 3.2 oz (96.3 kg)  01/22/17 209 lb (94.8 kg)  12/18/16 212 lb (96.2 kg)   BP Readings from Last 3 Encounters:  03/17/17 (!) 150/82  02/27/17 (!) 178/106  12/18/16 (!) 160/85   Pulse Readings from Last 3 Encounters:  03/17/17 77  02/27/17 81  12/18/16 83    Past Medical History:  Diagnosis Date  . Chronic neck and back pain   . Coronary  artery disease    drug-eluting stents placed in proximal and mid LAD  . Deviated septum   . Diabetic retinopathy (HCC)   . GERD (gastroesophageal reflux disease)    "occasionally" (03/23/2015)  . Headache    "more than 2/wk" (03/23/2015)  . Heart attack    "in 1997 was told I had signs of a small heart attack that I didn't know I'd had"  . High cholesterol   . History of gout    "when I was younger"  . Hypertension   . Kidney stones   . Migraine    "had my 1st and only in 12/2014" (03/23/2015)  . Peripheral vascular disease (HCC)    carotid artery disease  . PONV (postoperative nausea and vomiting)    "was told I was confused and combative in recovery from the narcotics w/my carotid OR"  . Sleep apnea    "severe; couldn't afford mask" (03/23/2015)  . Stroke [facility]) 2014   "on walker for 2 months; left side is weaker and numb since" (03/23/2015)  . Type II diabetes mellitus (HCC)     Current Outpatient Prescriptions on File Prior to Visit  Medication Sig Dispense Refill  . aspirin 81 MG chewable tablet Chew 1 tablet (81 mg total) by mouth daily.    Marland Kitchen atorvastatin (LIPITOR) 40 MG tablet TAKE 1 TABLET ONCE DAILY. 30 tablet 1  .  Cholecalciferol (VITAMIN D) 2000 UNITS tablet Take 2,000 Units by mouth daily.    . clopidogrel (PLAVIX) 75 MG tablet Take 1 tablet (75 mg total) by mouth daily with breakfast. 90 tablet 2  . cyclobenzaprine (FLEXERIL) 5 MG tablet Take 1 tablet (5 mg total) by mouth 3 (three) times daily. 90 tablet 0  . dapagliflozin propanediol (FARXIGA) 10 MG TABS tablet     . dicyclomine (BENTYL) 10 MG capsule Take 10 mg by mouth 3 (three) times daily before meals.     . diphenoxylate-atropine (LOMOTIL) 2.5-0.025 MG tablet Take 1 tablet by mouth daily as needed for diarrhea or loose stools.    . fluticasone (FLONASE) 50 MCG/ACT nasal spray Place 1 spray into both nostrils daily as needed.    . furosemide (LASIX) 40 MG tablet Take 40 mg by mouth daily as needed for fluid.     Marland Kitchen  gabapentin (NEURONTIN) 100 MG capsule TAKE 1 CAPSULE THREE TIMES DAILY. 90 capsule 6  . glimepiride (AMARYL) 4 MG tablet Take 4 mg by mouth 2 (two) times daily.    . Insulin Detemir (LEVEMIR FLEXTOUCH) 100 UNIT/ML Pen Inject 60 Units into the skin daily at 10 pm.     . isosorbide mononitrate (IMDUR) 30 MG 24 hr tablet Take 0.5 tablets (15 mg total) by mouth daily. 45 tablet 3  . loperamide (IMODIUM) 2 MG capsule Take by mouth daily as needed for diarrhea or loose stools.    . metFORMIN (GLUCOPHAGE-XR) 500 MG 24 hr tablet Take 500 mg by mouth 2 (two) times daily.    . metoprolol succinate (TOPROL-XL) 25 MG 24 hr tablet Take 1 tablet (25 mg total) by mouth daily. 30 tablet 11  . nitroGLYCERIN (NITROSTAT) 0.4 MG SL tablet Place 1 tablet (0.4 mg total) under the tongue every 5 (five) minutes as needed for chest pain. 25 tablet 2  . pantoprazole (PROTONIX) 40 MG tablet Take 40 mg by mouth daily.    Bertram Gala Glycol-Propyl Glycol (SYSTANE OP) Place 1 drop into both eyes 2 (two) times daily.    . ranitidine (ZANTAC) 300 MG tablet Take 1 tablet by mouth at bedtime.    . tamsulosin (FLOMAX) 0.4 MG CAPS capsule Take 1 capsule (0.4 mg total) by mouth daily. 30 capsule 1   No current facility-administered medications on file prior to visit.     No Known Allergies  Blood pressure (!) 150/82, pulse 77, SpO2 98 %.  Essential hypertension:  Blood pressure slightly improved but remains above goal of <130/80.  Patient reports drop in BP every time he exercises. Currently pretty sedentary and most of his physical activity is going upstairs at home or walking at [place].  We determined his home cuff is accurate within but his techniques had to be corrected. Will continue current medication without changes, and work on lifestyle modifications especially walking for 15-20 minutes non-stop 2-3 times per week.  Patient will continue to monitor blood pressure at home 2-3 hours after taking medication and in the  evenings.  Follow up in 4 weeks at the HTN clinic.  Ladoris Lythgoe Rodriguez-Guzman PharmD, BCPS [facility] [address] [place] [address] 03/17/2017 10:31 AM

## 2017-03-17 ENCOUNTER — Ambulatory Visit (INDEPENDENT_AMBULATORY_CARE_PROVIDER_SITE_OTHER): Payer: Medicare HMO | Admitting: Pharmacist

## 2017-03-17 VITALS — BP 150/82 | HR 77

## 2017-03-17 DIAGNOSIS — I10 Essential (primary) hypertension: Secondary | ICD-10-CM | POA: Diagnosis not present

## 2017-03-17 NOTE — Patient Instructions (Signed)
Return for a a follow up appointment in 4 weeks  Your blood pressure today is 150/82 pulse 77   Check your blood pressure at home daily (if able) and keep record of the readings.  Take your BP meds as follows: metoprolol succinate 25mg  daily (every morning) Isosorbide mononotrate 30mg  daily (1/2 tablet daily) (every morning) Furosemide 40mg  daily and 80mg  alternating (every morning)   **Work on lifestyle modifications like exersice and low sodium diet** **Take home blood pressure 2 hours after taking morning medication**   Bring all of your meds, your BP cuff and your record of home blood pressures to your next appointment.  Exercise as you're able, try to walk approximately 30 minutes per day.  Keep salt intake to a minimum, especially watch canned and prepared boxed foods.  Eat more fresh fruits and vegetables and fewer canned items.  Avoid eating in fast food restaurants.    HOW TO TAKE YOUR BLOOD PRESSURE: . Rest 5 minutes before taking your blood pressure. .  Don't smoke or drink caffeinated beverages for at least 30 minutes before. . Take your blood pressure before (not after) you eat. . Sit comfortably with your back supported and both feet on the floor (don't cross your legs). . Elevate your arm to heart level on a table or a desk. . Use the proper sized cuff. It should fit smoothly and snugly around your bare upper arm. There should be enough room to slip a fingertip under the cuff. The bottom edge of the cuff should be 1 inch above the crease of the elbow. . Ideally, take 3 measurements at one sitting and record the average.

## 2017-03-20 NOTE — Telephone Encounter (Signed)
Returned a call to the [employer] rep. I informed him the order was for this patient to receive a AVS machine. He was not sure what I was talking about. I then asked if he was with the respiratory department and he replied "No". So I recommended that he  Give the order to someone in the respiratory department for processing.

## 2017-03-26 ENCOUNTER — Telehealth: Payer: Self-pay | Admitting: Cardiovascular Disease

## 2017-03-26 NOTE — Telephone Encounter (Signed)
Returned the phone call to the patient. He stated that he was supposed to call when he received his CPAP. Will defer to the physician.

## 2017-03-26 NOTE — Telephone Encounter (Signed)
Informed patient's wife he will need his face to face appointment in July. I will have the scheduler to call to schedule this for them. Message sent to [facility].

## 2017-03-26 NOTE — Telephone Encounter (Signed)
New Message  Pt voiced to let us know he's received his cpap machine and waiting for a call back.  Please f/u

## 2017-04-02 ENCOUNTER — Encounter: Payer: Self-pay | Admitting: Gastroenterology

## 2017-04-03 ENCOUNTER — Telehealth: Payer: Self-pay | Admitting: Cardiovascular Disease

## 2017-04-03 NOTE — Telephone Encounter (Signed)
Closed encounter °

## 2017-04-16 ENCOUNTER — Ambulatory Visit (INDEPENDENT_AMBULATORY_CARE_PROVIDER_SITE_OTHER): Payer: Medicare HMO | Admitting: Pharmacist Clinician (PhC)/ Clinical Pharmacy Specialist

## 2017-04-16 DIAGNOSIS — I10 Essential (primary) hypertension: Secondary | ICD-10-CM | POA: Diagnosis not present

## 2017-04-16 MED ORDER — HYDRALAZINE HCL 25 MG PO TABS: 25.0000 mg | ORAL_TABLET | Freq: Every day | ORAL | 1 refills | Status: DC

## 2017-04-16 MED ORDER — METOPROLOL SUCCINATE ER 25 MG PO TB24: 12.5000 mg | ORAL_TABLET | Freq: Every day | ORAL | 1 refills | Status: DC

## 2017-04-16 NOTE — Assessment & Plan Note (Signed)
Patient with labile hypertension, systolic range is 87-209.  He appears to have a pattern of elevated morning readings followed by mid-day lows.  I have asked that he keep better record of his home pressure, twice daily, so we can determine whether this pattern occurs daily or only on days he is more active.  I will have him cut the metoprolol to 12.5 mg each morning and add hydralazine 25 mg each evening in hopes of bringing the morning numbers lower.  Will see him back in 1 month for follow up

## 2017-04-16 NOTE — Progress Notes (Signed)
Patient ID: Johnathan Keith                 DOB: 22-Jun-1961                      MRN: [id number]     HPI: Johnathan Keith is a 56 y.o. male patient of Dr Allyson Sabal referred by Corine Shelter PA-C to HTN clinic.  PMH includes CAD, DM, HTN, HLD, TIA, OSA, and PVD, s/p RCEA 2015. He has had labile HTN and at times will self-adjust his medications. Echo Sept 2017 showed normal LVF, mild LVH, grade 1 DD.  His last A1c was 6.7.    Today he returns for follow up and his home readings continue to show labile numbers.  For the most part his morning readings are 160-180 systolic but then mid-day he drops to readings < 100.  See below for full information.  Since his last visit he has been trying to walk for 30 minutes straight daily, but he finds he gets winded before the time is up, and his blood pressure drops.  He states that he doesn't get these BP drops if he doesn't walk.  I'm not sure if the change is related to his exercise, or maybe he doesn't feel hypotensive when he is just sitting in his chair watching TV.   He also reports that he feels as if his eyes are straining ("pulling") when he's hypotensive and he will get conjunctival bleeds when he's hypertensive.  He is being treated with injections for macular degeneration, so I don't know how much of this is related to hypertension vs MD.  Current HTN meds:  metoprolol succinate 25mg  daily (every morning) Isosorbide mononotrate 30mg  daily (1/2 tablet daily) (every morning) Furosemide 40mg  daily and 80mg  alternating (every morning)  Previously tried:  Lisinopril 10-40mg  daily - stopped due to labile BP   BP goal: 130/80  Family History: reports heart disease form mother and father, hypertension and diabeted from mother, father, brother, ans sister.  Social History: denies smoking, smokeless tobacco, alcohol use or other drugs  Diet: no table salt, avoiding high potassium vegetables, mainly eating frozen vegetables and meat, caffeine freed diet  drinks  Exercise: up and down steps at home, and walking while at [employer]; states walking for 30 minutes causes too many problems  Home BP readings:17 readings; range 87-209/55-103 If divide readings - morning avg  166/94     Mid-day avg 97/60 **OMRON home cuff/device determied to be accurate within **  Wt Readings from Last 3 Encounters:  02/27/17 212 lb 3.2 oz (96.3 kg)  01/22/17 209 lb (94.8 kg)  12/18/16 212 lb (96.2 kg)   BP Readings from Last 3 Encounters:  03/17/17 (!) 150/82  02/27/17 (!) 178/106  12/18/16 (!) 160/85   Pulse Readings from Last 3 Encounters:  03/17/17 77  02/27/17 81  12/18/16 83    Past Medical History:  Diagnosis Date  . Chronic neck and back pain   . Coronary artery disease    drug-eluting stents placed in proximal and mid LAD  . Deviated septum   . Diabetic retinopathy (HCC)   . GERD (gastroesophageal reflux disease)    "occasionally" (03/23/2015)  . Headache    "more than 2/wk" (03/23/2015)  . Heart attack    "in 1997 was told I had signs of a small heart attack that I didn't know I'd had"  . High cholesterol   . History of gout    "when I  was younger"  . Hypertension   . Kidney stones   . Migraine    "had my 1st and only in 12/2014" (03/23/2015)  . Peripheral vascular disease (HCC)    carotid artery disease  . PONV (postoperative nausea and vomiting)    "was told I was confused and combative in recovery from the narcotics w/my carotid OR"  . Sleep apnea    "severe; couldn't afford mask" (03/23/2015)  . Stroke [facility]) 2014   "on walker for 2 months; left side is weaker and numb since" (03/23/2015)  . Type II diabetes mellitus (HCC)     Current Outpatient Prescriptions on File Prior to Visit  Medication Sig Dispense Refill  . aspirin 81 MG chewable tablet Chew 1 tablet (81 mg total) by mouth daily.    Marland Kitchen atorvastatin (LIPITOR) 40 MG tablet TAKE 1 TABLET ONCE DAILY. 30 tablet 1  . Cholecalciferol (VITAMIN D) 2000 UNITS tablet Take  2,000 Units by mouth daily.    . clopidogrel (PLAVIX) 75 MG tablet Take 1 tablet (75 mg total) by mouth daily with breakfast. 90 tablet 2  . cyclobenzaprine (FLEXERIL) 5 MG tablet Take 1 tablet (5 mg total) by mouth 3 (three) times daily. 90 tablet 0  . dapagliflozin propanediol (FARXIGA) 10 MG TABS tablet     . dicyclomine (BENTYL) 10 MG capsule Take 10 mg by mouth 3 (three) times daily before meals.     . diphenoxylate-atropine (LOMOTIL) 2.5-0.025 MG tablet Take 1 tablet by mouth daily as needed for diarrhea or loose stools.    . fluticasone (FLONASE) 50 MCG/ACT nasal spray Place 1 spray into both nostrils daily as needed.    . furosemide (LASIX) 40 MG tablet Take 40 mg by mouth daily as needed for fluid.     Marland Kitchen gabapentin (NEURONTIN) 100 MG capsule TAKE 1 CAPSULE THREE TIMES DAILY. 90 capsule 6  . glimepiride (AMARYL) 4 MG tablet Take 4 mg by mouth 2 (two) times daily.    . Insulin Detemir (LEVEMIR FLEXTOUCH) 100 UNIT/ML Pen Inject 60 Units into the skin daily at 10 pm.     . isosorbide mononitrate (IMDUR) 30 MG 24 hr tablet Take 0.5 tablets (15 mg total) by mouth daily. 45 tablet 3  . loperamide (IMODIUM) 2 MG capsule Take by mouth daily as needed for diarrhea or loose stools.    . metFORMIN (GLUCOPHAGE-XR) 500 MG 24 hr tablet Take 500 mg by mouth 2 (two) times daily.    . metoprolol succinate (TOPROL-XL) 25 MG 24 hr tablet Take 1 tablet (25 mg total) by mouth daily. 30 tablet 11  . nitroGLYCERIN (NITROSTAT) 0.4 MG SL tablet Place 1 tablet (0.4 mg total) under the tongue every 5 (five) minutes as needed for chest pain. 25 tablet 2  . pantoprazole (PROTONIX) 40 MG tablet Take 40 mg by mouth daily.    Bertram Gala Glycol-Propyl Glycol (SYSTANE OP) Place 1 drop into both eyes 2 (two) times daily.    . ranitidine (ZANTAC) 300 MG tablet Take 1 tablet by mouth at bedtime.    . tamsulosin (FLOMAX) 0.4 MG CAPS capsule Take 1 capsule (0.4 mg total) by mouth daily. 30 capsule 1   No current  facility-administered medications on file prior to visit.     No Known Allergies  There were no vitals taken for this visit.  Essential hypertension:  Patient with labile hypertension, systolic range is 87-209.  He appears to have a pattern of elevated morning readings followed by mid-day lows.  I have asked that he keep better record of his home pressure, twice daily, so we can determine whether this pattern occurs daily or only on days he is more active.  I will have him cut the metoprolol to 12.5 mg each morning and add hydralazine 25 mg each evening in hopes of bringing the morning numbers lower.  Will see him back in 1 month for follow up   Phillips Hay PharmD CPP [facility] [address] [place] [address] 04/16/2017 8:09 AM

## 2017-04-16 NOTE — Patient Instructions (Signed)
Return for a a follow up appointment in 1 month  Your blood pressure today is 156/88  (goal is < 130/80)  Check your blood pressure at home daily  and keep record of the readings.  Take your BP meds as follows:  Decrease metoprolol to 1/2 tablet (12.5 mg) each morning  Start hydralazine 25 mg each evening  Continue isosorbide and furosemide  Bring all of your meds, your BP cuff and your record of home blood pressures to your next appointment.  Exercise as you're able, try to walk approximately 30 minutes per day.  Keep salt intake to a minimum, especially watch canned and prepared boxed foods.  Eat more fresh fruits and vegetables and fewer canned items.  Avoid eating in fast food restaurants.    HOW TO TAKE YOUR BLOOD PRESSURE: . Rest 5 minutes before taking your blood pressure. .  Don't smoke or drink caffeinated beverages for at least 30 minutes before. . Take your blood pressure before (not after) you eat. . Sit comfortably with your back supported and both feet on the floor (don't cross your legs). . Elevate your arm to heart level on a table or a desk. . Use the proper sized cuff. It should fit smoothly and snugly around your bare upper arm. There should be enough room to slip a fingertip under the cuff. The bottom edge of the cuff should be 1 inch above the crease of the elbow. . Ideally, take 3 measurements at one sitting and record the average.

## 2017-04-29 ENCOUNTER — Ambulatory Visit: Payer: Medicare HMO | Admitting: Cardiovascular Disease

## 2017-05-15 ENCOUNTER — Ambulatory Visit (INDEPENDENT_AMBULATORY_CARE_PROVIDER_SITE_OTHER): Payer: Medicare HMO | Admitting: Pharmacist Clinician (PhC)/ Clinical Pharmacy Specialist

## 2017-05-15 DIAGNOSIS — I10 Essential (primary) hypertension: Secondary | ICD-10-CM

## 2017-05-15 NOTE — Assessment & Plan Note (Addendum)
Patient with labile hypertension, elevated here, but did have a 36 point drop in systolic pressure from sitting to standing.  Because of this we cannot plan for him to have a goal BP of 130 systolic.  I would instead recommend that we keep him in the 150-160 systolic range.  With that in mind, I am not going to change any of his medications at this time.  He should continue to monitor home blood pressures, and especially note what is going on when he has these hypotensive episodes.

## 2017-05-15 NOTE — Progress Notes (Signed)
Patient ID: Johnathan Keith                 DOB: 11/29/1961                      MRN: [id number]     HPI: Johnathan Keith is a 56 y.o. male patient of Dr Johnathan Keith referred by Johnathan Shelter PA-C to HTN clinic.  PMH includes CAD, labile DM, HTN, HLD, TIA, OSA, and PVD, s/p RCEA 2015. He has had labile HTN and at times will self-adjust his medications. Echo Sept 2017 showed normal LVF, mild LVH, grade 1 DD.  His last A1c was 6.7.    Today he returns for follow up and his home readings continue to show labile numbers.  For the most part his morning readings are 150-180 systolic but then mid-day he occasionally drops to readings < 100.  This doesn't happen regularly (twice in the past month), and seemingly only on days that he goes out to the drug store.  He will note feeling bad by the time he gets there, then on the store BP machine will get a reading like 85/54.  He will eat some crackers with Gatorade or juice, then go home.  He doesn't check his BP at home during mid-day, so it's hard to determine if this only occurs when he goes out or if he just notices the low because he is up and moving about.  At his last visit we added hydralazine 25 mg each night, in hopes of lowering the morning BP without dropping mid-day readings.   At his last visit we encouraged him to walk more, up to 30 minutes or at least a lap around [place] before shopping.  He has been unable to do this, he gets thru his shopping leaning on a cart then goes home and rests for several hours.  He also notes that just going up or down a flight of stairs will do the same thing.  Current HTN meds:  metoprolol succinate 12.5mg  daily (every morning) Hydralazine 25 mg daily (every evening) Isosorbide mononotrate 30mg  daily (1/2 tablet daily) (every morning) Furosemide 40mg  daily and 80mg  alternating (every morning)  Previously tried:  Lisinopril 10-40mg  daily - stopped due to labile BP   BP goal: <160/80  Family History: reports heart disease  form mother and father, hypertension and diabeted from mother, father, brother, ans sister.  Social History: denies smoking, smokeless tobacco, alcohol use or other drugs; caffeine only when eating out Diet: no table salt, avoiding high potassium vegetables, mainly eating frozen vegetables and meat, caffeine freed diet drinks.  Not adding salt, but not actively avoiding pre salted itmes  Exercise: not able, states feels chest tightness even going up/down stairs; grocery shopping wears him out, walks with cart support.   Home BP readings:17 readings; range 87-209/55-103 If divide readings - morning avg (25 readings)  159/95 (previously was166/94)  Evening readings (12 readings) 153/87    **OMRON home cuff/device determied to be accurate within **  Wt Readings from Last 3 Encounters:  02/27/17 212 lb 3.2 oz (96.3 kg)  01/22/17 209 lb (94.8 kg)  12/18/16 212 lb (96.2 kg)   BP Readings from Last 3 Encounters:  05/15/17 (!) 166/100  04/16/17 (!) 156/88  03/17/17 (!) 150/82   Pulse Readings from Last 3 Encounters:  05/15/17 84  04/16/17 84  03/17/17 77    Past Medical History:  Diagnosis Date  . Chronic neck and back pain   .  Coronary artery disease    drug-eluting stents placed in proximal and mid LAD  . Deviated septum   . Diabetic retinopathy (HCC)   . GERD (gastroesophageal reflux disease)    "occasionally" (03/23/2015)  . Headache    "more than 2/wk" (03/23/2015)  . Heart attack (HCC)    "in 1997 was told I had signs of a small heart attack that I didn't know I'd had"  . High cholesterol   . History of gout    "when I was younger"  . Hypertension   . Kidney stones   . Migraine    "had my 1st and only in 12/2014" (03/23/2015)  . Peripheral vascular disease (HCC)    carotid artery disease  . PONV (postoperative nausea and vomiting)    "was told I was confused and combative in recovery from the narcotics w/my carotid OR"  . Sleep apnea    "severe; couldn't afford  mask" (03/23/2015)  . Stroke [facility]) 2014   "on walker for 2 months; left side is weaker and numb since" (03/23/2015)  . Type II diabetes mellitus (HCC)     Current Outpatient Prescriptions on File Prior to Visit  Medication Sig Dispense Refill  . aspirin 81 MG chewable tablet Chew 1 tablet (81 mg total) by mouth daily.    . Cholecalciferol (VITAMIN D) 2000 UNITS tablet Take 2,000 Units by mouth daily.    . clopidogrel (PLAVIX) 75 MG tablet Take 1 tablet (75 mg total) by mouth daily with breakfast. 90 tablet 2  . cyclobenzaprine (FLEXERIL) 5 MG tablet Take 1 tablet (5 mg total) by mouth 3 (three) times daily. 90 tablet 0  . dapagliflozin propanediol (FARXIGA) 10 MG TABS tablet     . dicyclomine (BENTYL) 10 MG capsule Take 10 mg by mouth 3 (three) times daily before meals.     . diphenoxylate-atropine (LOMOTIL) 2.5-0.025 MG tablet Take 1 tablet by mouth daily as needed for diarrhea or loose stools.    . fluticasone (FLONASE) 50 MCG/ACT nasal spray Place 1 spray into both nostrils daily as needed.    . furosemide (LASIX) 40 MG tablet Take 40 mg by mouth daily as needed for fluid.     Marland Kitchen gabapentin (NEURONTIN) 100 MG capsule TAKE 1 CAPSULE THREE TIMES DAILY. 90 capsule 6  . glimepiride (AMARYL) 4 MG tablet Take 4 mg by mouth 2 (two) times daily.    . hydrALAZINE (APRESOLINE) 25 MG tablet Take 1 tablet (25 mg total) by mouth at bedtime. 30 tablet 1  . Insulin Detemir (LEVEMIR FLEXTOUCH) 100 UNIT/ML Pen Inject 60 Units into the skin daily at 10 pm.     . isosorbide mononitrate (IMDUR) 30 MG 24 hr tablet Take 0.5 tablets (15 mg total) by mouth daily. 45 tablet 3  . loperamide (IMODIUM) 2 MG capsule Take by mouth daily as needed for diarrhea or loose stools.    . metFORMIN (GLUCOPHAGE-XR) 500 MG 24 hr tablet Take 500 mg by mouth 2 (two) times daily.    . metoprolol succinate (TOPROL-XL) 25 MG 24 hr tablet Take 0.5 tablets (12.5 mg total) by mouth daily. 45 tablet 1  . nitroGLYCERIN (NITROSTAT) 0.4 MG SL  tablet Place 1 tablet (0.4 mg total) under the tongue every 5 (five) minutes as needed for chest pain. 25 tablet 2  . pantoprazole (PROTONIX) 40 MG tablet Take 40 mg by mouth daily.    Bertram Gala Glycol-Propyl Glycol (SYSTANE OP) Place 1 drop into both eyes 2 (two) times daily.    Marland Kitchen  ranitidine (ZANTAC) 300 MG tablet Take 1 tablet by mouth at bedtime.    . tamsulosin (FLOMAX) 0.4 MG CAPS capsule Take 1 capsule (0.4 mg total) by mouth daily. 30 capsule 1   No current facility-administered medications on file prior to visit.     No Known Allergies  Blood pressure (!) 166/100, pulse 84.  Standing 130/78  Essential hypertension:  Patient with labile hypertension, elevated here, but did have a 36 point drop in systolic pressure from sitting to standing.  Because of this we cannot plan for him to have a goal BP of 130 systolic.  I would instead recommend that we keep him in the 150-160 systolic range.  With that in mind, I am not going to change any of his medications at this time.  He should continue to monitor home blood pressures, and especially note what is going on when he has these hypotensive episodes.    Phillips Hay PharmD CPP [facility] [address] [place] [address] 05/15/2017 11:29 AM

## 2017-05-15 NOTE — Patient Instructions (Addendum)
Return for a a follow up appointment with Dr. Tresa Endo in July  Your blood pressure today is 160/100  Try eating a small salty snack before heading out to the drugstore to avoid the low readings when you're out  Check your blood pressure at home several times each week and keep record of the readings.  Take your BP meds as follows:  metoprolol succinate 12.5mg  daily (every morning)  Hydralazine 25 mg daily (every night)  Isosorbide mononotrate 30mg  daily (1/2 tablet daily) (every morning)  Furosemide 40mg  daily and 80mg  alternating (every morning)  Bring all of your meds, your BP cuff and your record of home blood pressures to your next appointment.  Exercise as you're able, try to walk approximately 30 minutes per day.  Keep salt intake to a minimum, especially watch canned and prepared boxed foods.  Eat more fresh fruits and vegetables and fewer canned items.  Avoid eating in fast food restaurants.    HOW TO TAKE YOUR BLOOD PRESSURE: . Rest 5 minutes before taking your blood pressure. .  Don't smoke or drink caffeinated beverages for at least 30 minutes before. . Take your blood pressure before (not after) you eat. . Sit comfortably with your back supported and both feet on the floor (don't cross your legs). . Elevate your arm to heart level on a table or a desk. . Use the proper sized cuff. It should fit smoothly and snugly around your bare upper arm. There should be enough room to slip a fingertip under the cuff. The bottom edge of the cuff should be 1 inch above the crease of the elbow. . Ideally, take 3 measurements at one sitting and record the average.

## 2017-05-18 ENCOUNTER — Other Ambulatory Visit: Payer: Self-pay | Admitting: Neurology

## 2017-05-18 DIAGNOSIS — M5481 Occipital neuralgia: Secondary | ICD-10-CM

## 2017-05-20 ENCOUNTER — Other Ambulatory Visit: Payer: Self-pay

## 2017-05-20 DIAGNOSIS — M5481 Occipital neuralgia: Secondary | ICD-10-CM

## 2017-05-20 MED ORDER — CYCLOBENZAPRINE HCL 5 MG PO TABS: 5.0000 mg | ORAL_TABLET | Freq: Three times a day (TID) | ORAL | 1 refills | Status: DC

## 2017-06-10 ENCOUNTER — Other Ambulatory Visit: Payer: Self-pay

## 2017-06-10 MED ORDER — CLOPIDOGREL BISULFATE 75 MG PO TABS: 75.0000 mg | ORAL_TABLET | Freq: Every day | ORAL | 2 refills | Status: DC

## 2017-06-13 ENCOUNTER — Other Ambulatory Visit: Payer: Self-pay | Admitting: Neurology

## 2017-06-13 ENCOUNTER — Other Ambulatory Visit: Payer: Self-pay | Admitting: Cardiovascular Disease

## 2017-06-13 NOTE — Telephone Encounter (Signed)
Rn call patients Johnathan Poet, MD office.The office is closed till June 16, 2017 due to family emergency. The vm state pt can call a number to speak with the MD for any concerns. Rn will send message to Dr. Roda Shutters.

## 2017-06-13 NOTE — Telephone Encounter (Signed)
Patient called office requesting refill for gabapentin (NEURONTIN) 100 MG capsule.  Advised patient we have not filled the medication since 2016 he then advised me his PCP has been filling the medication, but their office has been closed June 25th-July 9th due to family emergency. Pharmacy- [facility] [place].  Please contact wife Darl Pikes (listed on [place]) at [phone]

## 2017-06-13 NOTE — Telephone Encounter (Signed)
Rn call [facility] to find out when did pt last get refill. The pharmacy rep stated Shelle Iron, MD last prescribed medication and pt got it refill June 2018. The rep stated pt has no refills left on medication.

## 2017-06-13 NOTE — Telephone Encounter (Signed)
Dr. Roda Shutters see all phone notes. Pt has no more appts and was told follow up as needed.Please advise thanks.

## 2017-06-15 ENCOUNTER — Other Ambulatory Visit: Payer: Self-pay | Admitting: Neurology

## 2017-06-15 DIAGNOSIS — M545 Low back pain, unspecified: Secondary | ICD-10-CM

## 2017-06-15 DIAGNOSIS — G8929 Other chronic pain: Secondary | ICD-10-CM

## 2017-06-15 MED ORDER — GABAPENTIN 100 MG PO CAPS: 100.0000 mg | ORAL_CAPSULE | Freq: Three times a day (TID) | ORAL | 0 refills | Status: DC

## 2017-06-15 NOTE — Telephone Encounter (Signed)
I have ordered one month supply of the gabapentin to his pharmacy in [place]. Please let him know. He can continue to get it refilled later with his PCP. Thanks.   Marvel Plan, MD PhD Stroke Neurology 06/15/2017 8:03 AM

## 2017-06-16 NOTE — Telephone Encounter (Signed)
Pts PCP was the original prescriber for the gabapentin;  LEft vm for patient that refill was done for 30 days only with no refills..Pt has no more appts at [facility]. He was last seen 12/2016 in our office. Rn left vm that once this refill ends his PCP will need to continue the medication. Pts PCP office was closed for a week.

## 2017-06-19 ENCOUNTER — Telehealth: Payer: Self-pay | Admitting: Neurology

## 2017-06-19 NOTE — Telephone Encounter (Signed)
Rn call patients cell phone, and his wife answer. Rn explain to wife on dpr form that Dr. Roda Shutters agrees with the PCP plan of not refilling. Also pt does not need appt just for refill because PCP refuse to manage it. Pts wife stated the" PCP does not want to refill because he has been in trouble before prescribing certain medications."Rn stated the PCP prescribed gabapentin for patient. Rn stated pt was discharge back to his PCP for all medication refills. Rn stated the flexeril was for neck pain and muscle relaxation. Pts wife stated its for his headache too. The wife stated when he gets neck pain it gives him a headache. Rn advised wife to ask the PCP about a referral to a orthopedic MD for neck pain. Pts wife stated" well since Dr. Roda Shutters discharge,and does not want to refill because PCP refused  will find another neurologist who will refill it and than she hung up the phone." This whole conversation was heard by Ardine Eng.

## 2017-06-19 NOTE — Telephone Encounter (Signed)
Rn spoke with Dr. Roda Shutters today via telephone. Rn explain pts PCP does not feel comfortable with prescribing flexeril. Pt was discharge from Dr Roda Shutters on 12/18/2016, 03/2016. Pt was told to follow up with PCP and to follow up as needed. Rn explain to Dr. Roda Shutters, pt has one refill left on his flexeril, and PCP does not feel comfortable in prescribing it. Rn stated pt wanted to schedule appt to get more refills for the flexeril. Per Dr. Roda Shutters pt does not need appt at [facility] just for flexeril refills. Dr. Roda Shutters also stated if PCP does not feel comfortable he agrees with the PCP advice of not refilling.

## 2017-06-19 NOTE — Telephone Encounter (Signed)
Patient called office in reference to cyclobenzaprine (FLEXERIL) 5 MG tablet.  Advised patient he has 1 refill sitting at the pharmacy, but future refills will need to be filled by PCP.  Patient said his PCP will not feel comfortable filling for patient in the future.  Patient would like to know if he needs to schedule an appointment with Dr. Roda Shutters to get medication.  Please call

## 2017-06-20 ENCOUNTER — Encounter: Payer: Self-pay | Admitting: Cardiovascular Disease

## 2017-06-20 ENCOUNTER — Ambulatory Visit (INDEPENDENT_AMBULATORY_CARE_PROVIDER_SITE_OTHER): Payer: Medicare HMO | Admitting: Cardiovascular Disease

## 2017-06-20 VITALS — BP 130/88 | HR 88 | Ht 66.0 in | Wt 212.6 lb

## 2017-06-20 DIAGNOSIS — Z9861 Coronary angioplasty status: Secondary | ICD-10-CM

## 2017-06-20 DIAGNOSIS — G4731 Primary central sleep apnea: Secondary | ICD-10-CM | POA: Diagnosis not present

## 2017-06-20 DIAGNOSIS — Z9889 Other specified postprocedural states: Secondary | ICD-10-CM | POA: Diagnosis not present

## 2017-06-20 DIAGNOSIS — E1159 Type 2 diabetes mellitus with other circulatory complications: Secondary | ICD-10-CM

## 2017-06-20 DIAGNOSIS — I2510 Atherosclerotic heart disease of native coronary artery without angina pectoris: Secondary | ICD-10-CM | POA: Diagnosis not present

## 2017-06-20 DIAGNOSIS — I10 Essential (primary) hypertension: Secondary | ICD-10-CM

## 2017-06-20 NOTE — Patient Instructions (Signed)
Your physician recommends that you schedule a follow-up appointment in: 1 year or sooner if needed with Dr Tresa Endo.

## 2017-06-22 NOTE — Progress Notes (Signed)
Cardiology Office Note    Date:  06/22/2017   ID:  Johnathan Keith, DOB 09/22/1961, MRN [id number]  PCP:  Charlotte Sanes, MD  Cardiologist:  Shelva Majestic, MD (sleep); Dr. Gwenlyn Found  Sleep Clinic evaluation  History of Present Illness:  Johnathan Keith is a 56 y.o. male who was referred by Dr. Gwenlyn Found for an sleep clinic evaluation. I had initially seen him in November 2017.  He now presents for sleep clinic evaluation following initiation of CPAP therapy.  Johnathan Keith has a history of hypertension, CAD, status post PCI to LAD , cerebrovascular disease with prior TIA and carotid artery disease.  He underwent right carotid endarterectomy in 2015.  He's had issues with dyspnea on exertion.  CAD and underwent cardiac catheterization in 2016 which time he underwent DES stenting to a proximal and mid LAD stenosis.  He has been disabled since his TIA/stroke and he states he has residual left-sided weakness.  He has noticed significant difficulty with sleeping.  He has difficulty with sleep maintenance.  He has had episodes of witnessed apnea.  His sleep is nonrestorative.  He typically wakes up at least 4-5 times per night to go to the bathroom.  He has awakened gasping for breath.  He complained of daytime sleepiness and when I initially saw him an Epworth Sleepiness Scale score was calculated and endorsed at 12 and shown below.  This is compatible with hypersomnolence.  Epworth Sleepiness Scale: Situation   Chance of Dozing/Sleeping (0 = never , 1 = slight chance , 2 = moderate chance , 3 = high chance )   sitting and reading  -   watching TV 1   sitting inactive in a public place 1   being a passenger in a motor vehicle for an hour or more 3   lying down in the afternoon 3   sitting and talking to someone 1   sitting quietly after lunch (no alcohol) 2   while stopped for a few minutes in traffic as the driver 1   Total Score  12   He underwent a sleep study on 12/18/2016.  This revealed severe  sleep apnea with an AHI of 54.3 per hour and RDI of 56.7 per hour.  CPAP was initiated at 5 cm water pressure and was titrated up to 15 cm.  He was then started on BiPAP therapy, but at 1814 AHI was still markedly elevated at 61.2 with 10 central events.  He was then reverted back to CPAP with further titration up to 22 cm.  AHI remained elevated throughout all pressures.  Oxygen nadir was 66%.  As result, he was referred for an [employer] ServoVentilation titration, which was done in February 14,2018.  Since initiating ASV, he has felt markedly improved.  His sleep is nonrestorative.  Where as previously he used to wake up 5 times per night for urination.  He now is at most getting up 1 time per night.  Download was obtained from June 13 through 06/19/2017.  This shows 100% of usage stays.  Usage greater than 4 hours was 83% of the nights.  However, he was only using his CPAP.  His ASV for 4 hours and 41 minutes.  He was set at a minimum EPAP pressure of 5, a maximum EPAP pressure of 8, a minimum pressure support of 4 and maximum pressure support of 15.  His 95th percentile IPAP pressure was 18.6 with a maximum of 21.6. AHI was 6.7.  Review of the download  indicates he is having significant leak.  He presents for evaluation.  Past Medical History:  Diagnosis Date  . Chronic neck and back pain   . Coronary artery disease    drug-eluting stents placed in proximal and mid LAD  . Deviated septum   . Diabetic retinopathy ([place])   . GERD (gastroesophageal reflux disease)    "occasionally" (03/23/2015)  . Headache    "more than 2/wk" (03/23/2015)  . Heart attack ([place])    "in 1997 was told I had signs of a small heart attack that I didn't know I'd had"  . High cholesterol   . History of gout    "when I was younger"  . Hypertension   . Kidney stones   . Migraine    "had my 1st and only in 12/2014" (03/23/2015)  . Peripheral vascular disease ([place])    carotid artery disease  . PONV (postoperative nausea and  vomiting)    "was told I was confused and combative in recovery from the narcotics w/my carotid OR"  . Sleep apnea    "severe; couldn't afford mask" (03/23/2015)  . Stroke [facility]) 2014   "on walker for 2 months; left side is weaker and numb since" (03/23/2015)  . Type II diabetes mellitus ([place])     Past Surgical History:  Procedure Laterality Date  . CIRCUMCISION  1981  . CORONARY ANGIOPLASTY WITH STENT PLACEMENT  03/23/2015   "2"  . ENDARTERECTOMY Right 09/26/2014   Procedure: RIGHT CAROTID ENDARTERECTOMY WITH PATCH ANGIOPLASTY;  Surgeon: Conrad Alma Center, MD;  Location: [place];  Service: Vascular;  Laterality: Right;  . EYE SURGERY Right May 2016   Cataract  . EYE SURGERY Left July 2016   Cataract  . HYDROCELE EXCISION  ~ 2008  . LEFT HEART CATHETERIZATION WITH CORONARY ANGIOGRAM N/A 03/23/2015   Procedure: LEFT HEART CATHETERIZATION WITH CORONARY ANGIOGRAM;  Surgeon: Lorretta Harp, MD;  Location: [facility] CATH LAB;  Service: Cardiovascular;  Laterality: N/A;  . REFRACTIVE SURGERY Bilateral 08/2014-09/2014   Diabetic retinopathy     Current Medications: Outpatient Medications Prior to Visit  Medication Sig Dispense Refill  . aspirin 81 MG chewable tablet Chew 1 tablet (81 mg total) by mouth daily.    . Cholecalciferol (VITAMIN D) 2000 UNITS tablet Take 2,000 Units by mouth daily.    . clopidogrel (PLAVIX) 75 MG tablet Take 1 tablet (75 mg total) by mouth daily with breakfast. 90 tablet 2  . cyclobenzaprine (FLEXERIL) 5 MG tablet Take 1 tablet (5 mg total) by mouth 3 (three) times daily. 90 tablet 1  . dapagliflozin propanediol (FARXIGA) 10 MG TABS tablet     . dicyclomine (BENTYL) 10 MG capsule Take 10 mg by mouth 3 (three) times daily before meals.     . diphenoxylate-atropine (LOMOTIL) 2.5-0.025 MG tablet Take 1 tablet by mouth daily as needed for diarrhea or loose stools.    . fluticasone (FLONASE) 50 MCG/ACT nasal spray Place 1 spray into both nostrils daily as needed.    . furosemide  (LASIX) 40 MG tablet Take 80 mg by mouth every other day.     . gabapentin (NEURONTIN) 100 MG capsule Take 1 capsule (100 mg total) by mouth 3 (three) times daily. 90 capsule 0  . glimepiride (AMARYL) 4 MG tablet Take 4 mg by mouth 2 (two) times daily.    . hydrALAZINE (APRESOLINE) 25 MG tablet TAKE 1 TABLET BY MOUTH AT BEDTIME 30 tablet 1  . Insulin Detemir (LEVEMIR FLEXTOUCH) 100 UNIT/ML Pen  Inject 60 Units into the skin daily at 10 pm.     . loperamide (IMODIUM) 2 MG capsule Take by mouth daily as needed for diarrhea or loose stools.    . metFORMIN (GLUCOPHAGE-XR) 500 MG 24 hr tablet Take 500 mg by mouth 2 (two) times daily.    . metoprolol succinate (TOPROL-XL) 25 MG 24 hr tablet Take 0.5 tablets (12.5 mg total) by mouth daily. 45 tablet 1  . nitroGLYCERIN (NITROSTAT) 0.4 MG SL tablet Place 1 tablet (0.4 mg total) under the tongue every 5 (five) minutes as needed for chest pain. 25 tablet 2  . pantoprazole (PROTONIX) 40 MG tablet Take 40 mg by mouth daily.    Johnathan Keith Glycol-Propyl Glycol (SYSTANE OP) Place 1 drop into both eyes 2 (two) times daily.    . ranitidine (ZANTAC) 300 MG tablet Take 1 tablet by mouth at bedtime.    . tamsulosin (FLOMAX) 0.4 MG CAPS capsule Take 1 capsule (0.4 mg total) by mouth daily. 30 capsule 1  . isosorbide mononitrate (IMDUR) 30 MG 24 hr tablet Take 0.5 tablets (15 mg total) by mouth daily. 45 tablet 3   No facility-administered medications prior to visit.      Allergies:   Patient has no known allergies.   Social History   Social History  . Marital status: Married    Spouse name: N/A  . Number of children: 5  . Years of education: 12th   Occupational History  . Disabled    Social History Main Topics  . Smoking status: Never Smoker  . Smokeless tobacco: Never Used  . Alcohol use No  . Drug use: No  . Sexual activity: Not Currently    Partners: Female   Other Topics Concern  . None   Social History Narrative   Patient is married with 5  children.    Patient is right handed.   Patient has hs education.   Patient drinks 4 12 oz can sodas daily.     Family History:  The patient's family history includes Asthma in his brother; Cancer in his brother and father; Colon cancer in his sister; Diabetes in his brother, daughter, maternal grandfather, and mother; Gout in his brother and maternal uncle; Heart attack in his father; Heart disease in his father and mother; Hyperlipidemia in his brother, mother, and sister; Hypertension in his brother, mother, and sister; Stroke in his paternal grandfather.   ROS General: Negative; No fevers, chills, or night sweats;  HEENT: Negative; No changes in vision or hearing, sinus congestion, difficulty swallowing Pulmonary: Negative; No cough, wheezing, shortness of breath, hemoptysis Cardiovascular: Negative; No chest pain, presyncope, syncope, palpitations GI: Negative; No nausea, vomiting, diarrhea, or abdominal pain GU: Negative; No dysuria, hematuria, or difficulty voiding Musculoskeletal: Negative; no myalgias, joint pain, or weakness Hematologic/Oncology: Negative; no easy bruising, bleeding Endocrine: Negative; no heat/cold intolerance; no diabetes Neuro: Negative; no changes in balance, headaches Skin: Negative; No rashes or skin lesions Psychiatric: Negative; No behavioral problems, depression Sleep: Positive for witnessed apnea, gasping for breath, snoring, daytime sleepiness, hypersomnolence;  Now on ASV with excellent improvement and resolution of above symptoms; no bruxism, restless legs, hypnogognic hallucinations, no cataplexy Other comprehensive 14 point system review is negative.   PHYSICAL EXAM:   VS:  BP 130/88   Pulse 88   Ht 5' 6"  (1.676 m)   Wt 212 lb 9.6 oz (96.4 kg)   BMI 34.31 kg/m     BP by me 130/84  Wt Readings from  Last 3 Encounters:  06/20/17 212 lb 9.6 oz (96.4 kg)  02/27/17 212 lb 3.2 oz (96.3 kg)  01/22/17 209 lb (94.8 kg)    General: Alert,  oriented, no distress.  Skin: normal turgor, no rashes, warm and dry HEENT: Normocephalic, atraumatic. Pupils equal round and reactive to light; sclera anicteric; extraocular muscles intact;  Nose without nasal septal hypertrophy Mouth/Parynx benign; Mallinpatti scale 3 Neck: No JVD, no carotid bruits; normal carotid upstroke Lungs: clear to ausculatation and percussion; no wheezing or rales Chest wall: without tenderness to palpitation Heart: PMI not displaced, RRR, s1 s2 normal, 1/6 systolic murmur, no diastolic murmur, no rubs, gallops, thrills, or heaves Abdomen: soft, nontender; no hepatosplenomehaly, BS+; abdominal aorta nontender and not dilated by palpation. Back: no CVA tenderness Pulses 2+ Musculoskeletal: full range of motion, normal strength, no joint deformities Extremities: no clubbing cyanosis or edema, Homan's sign negative  Neurologic: grossly nonfocal; Cranial nerves grossly wnl Psychologic: Normal mood and affect    Studies/Labs Reviewed:   EKG:  Not ordered today, but an independent review of his last ECG from 08/02/2016 shows normal sinus rhythm at 97 bpm.  There is no ectopy.  Recent Labs: BMP Latest Ref Rng & Units 04/18/2015 03/24/2015 03/22/2015  Glucose 70 - 99 mg/dL 117(H) 229(H) 135(H)  BUN 6 - 23 mg/dL 13 16 22   Creatinine 0.50 - 1.35 mg/dL 0.72 0.78 0.81  Sodium 135 - 145 mEq/L 144 140 144  Potassium 3.5 - 5.3 mEq/L 4.5 3.5 4.5  Chloride 96 - 112 mEq/L 104 103 103  CO2 19 - 32 mEq/L 28 25 28   Calcium 8.4 - 10.5 mg/dL 8.8 8.4 10.1     Hepatic Function Latest Ref Rng & Units 03/22/2015 09/22/2014 07/27/2014  Total Protein 6.0 - 8.3 g/dL 7.2 8.2 6.8  Albumin 3.5 - 5.2 g/dL 4.2 4.3 4.3  AST 0 - 37 U/L 17 18 20   ALT 0 - 53 U/L 22 29 34  Alk Phosphatase 39 - 117 U/L 47 61 58  Total Bilirubin 0.2 - 1.2 mg/dL 0.4 0.6 0.5  Bilirubin, Direct 0.00 - 0.40 mg/dL - - 0.15    CBC Latest Ref Rng & Units 03/24/2015 03/22/2015 09/27/2014  WBC 4.0 - 10.5 K/uL 9.7  11.6(H) 13.7(H)  Hemoglobin 13.0 - 17.0 g/dL 13.0 14.4 12.4(L)  Hematocrit 39.0 - 52.0 % 39.0 42.9 36.4(L)  Platelets 150 - 400 K/uL 229 283 221   Lab Results  Component Value Date   MCV 87.2 03/24/2015   MCV 85.3 03/22/2015   MCV 91.0 09/27/2014   Lab Results  Component Value Date   TSH 2.860 07/27/2014   Lab Results  Component Value Date   HGBA1C 12.3 (H) 07/27/2014     BNP No results found for: BNP  ProBNP No results found for: PROBNP   Lipid Panel     Component Value Date/Time   CHOL 132 03/22/2015 1136   CHOL 156 07/27/2014 0843   TRIG 222 (H) 03/22/2015 1136   HDL 34 (L) 03/22/2015 1136   HDL 30 (L) 07/27/2014 0843   CHOLHDL 3.9 03/22/2015 1136   VLDL 44 (H) 03/22/2015 1136   LDLCALC 54 03/22/2015 1136   LDLCALC 84 07/27/2014 0843     RADIOLOGY: No results found.   Additional studies/ records that were reviewed today include:   I reviewed the office records from Dr. Gwenlyn Found. I reviewed his initial split-night sleep study as well as ASV titration studies.  I generated a download for assessment of compliance  and therapy efficacy.   ASSESSMENT:    1. Complex sleep apnea syndrome   2. Essential hypertension   3. History of carotid endarterectomy   4. Type 2 diabetes mellitus with vascular disease ([place])   5. Coronary artery disease involving native coronary artery of native heart without angina pectoris   6. CAD -S/P PCI-2016      PLAN:  Johnathan Keith is a 56 year old gentleman who has significant cardiovascular comorbidities including hypertension, CAD, TIA and carotid disease.  When I initially saw him, his symptoms were highly suggestive of sleep apnea.  His initial evaluation revealed very severe sleep apnea with an AHI of 54, RDI of 56.7, and oxygen nadir of 66%.  He required high pressures and did not tolerate BiPAP or CPAP despite high pressures due to central events.  I reviewed his ASV titration in detail.  This is significantly improved.   His objective data and has a 90 foot percentile, average IPAP pressure of 18.6.  AHI was 6.7.  He had been set at a minimum EPAP of 5 with a maximum EPAP of 8 cm water pressure.  I will increase his maximum EPAP pressure up to 10.  He is only sleeping 4 hours and 41 minutes.  I had a long discussion with him regarding the importance of increased sleep duration.  I reviewed data with reference to increase mortality associated with sleep deprivation, less than 5 hours.  He has cardiovascular morbidities.  I discussed with him the importance of continued use of his ASV.  His blood pressure today is normal on his medical regimen consisting of furosemide 80 mg every other day, hydralazine, metoprolol therapy.  He also is on low-dose nitrates.  He continues to be on dual platelet therapy with his TIA history.  He has diabetes and is on far see in addition to glimepiride, metformin and insulin.  He is on pravastatin for hyperlipidemia.  I will see him in one year for reevaluation.   Medication Adjustments/Labs and Tests Ordered: Current medicines are reviewed at length with the patient today.  Concerns regarding medicines are outlined above.  Medication changes, Labs and Tests ordered today are listed in the Patient Instructions below.  Patient Instructions  Your physician recommends that you schedule a follow-up appointment in: 1 year or sooner if needed with Dr Claiborne Billings.     Signed, Shelva Majestic, MD  06/22/2017 1:29 PM    [facility] [address], [place], [place], [place]  [address] Phone: [phone]

## 2017-07-08 ENCOUNTER — Other Ambulatory Visit: Payer: Self-pay | Admitting: Cardiology

## 2017-07-08 NOTE — Telephone Encounter (Signed)
Rx(s) sent to pharmacy electronically.  

## 2017-08-16 ENCOUNTER — Other Ambulatory Visit: Payer: Self-pay | Admitting: Cardiovascular Disease

## 2017-08-21 ENCOUNTER — Other Ambulatory Visit: Payer: Self-pay | Admitting: Cardiovascular Disease

## 2017-08-21 MED ORDER — FUROSEMIDE 80 MG PO TABS: 80.0000 mg | ORAL_TABLET | ORAL | 1 refills | Status: DC

## 2017-08-21 NOTE — Telephone Encounter (Signed)
Rx(s) sent to pharmacy electronically.  

## 2017-08-27 ENCOUNTER — Ambulatory Visit (INDEPENDENT_AMBULATORY_CARE_PROVIDER_SITE_OTHER): Payer: Medicare HMO | Admitting: Cardiovascular Disease

## 2017-08-27 ENCOUNTER — Encounter: Payer: Self-pay | Admitting: Cardiovascular Disease

## 2017-08-27 VITALS — BP 178/92 | HR 81 | Ht 66.0 in | Wt 221.6 lb

## 2017-08-27 DIAGNOSIS — R0602 Shortness of breath: Secondary | ICD-10-CM

## 2017-08-27 DIAGNOSIS — R072 Precordial pain: Secondary | ICD-10-CM

## 2017-08-27 DIAGNOSIS — E7800 Pure hypercholesterolemia, unspecified: Secondary | ICD-10-CM

## 2017-08-27 LAB — HEPATIC FUNCTION PANEL
ALT: 27 IU/L (ref 0–44)
AST: 21 IU/L (ref 0–40)
Albumin: 4.5 g/dL (ref 3.5–5.5)
Alkaline Phosphatase: 56 IU/L (ref 39–117)
Bilirubin Total: 0.2 mg/dL (ref 0.0–1.2)
Bilirubin, Direct: 0.09 mg/dL (ref 0.00–0.40)
Total Protein: 7.3 g/dL (ref 6.0–8.5)

## 2017-08-27 LAB — LIPID PANEL
Chol/HDL Ratio: 4.3 ratio (ref 0.0–5.0)
Cholesterol, Total: 146 mg/dL (ref 100–199)
HDL: 34 mg/dL — ABNORMAL LOW (ref >39)
LDL Calculated: 88 mg/dL (ref 0–99)
Triglycerides: 119 mg/dL (ref 0–149)
VLDL Cholesterol Cal: 24 mg/dL (ref 5–40)

## 2017-08-27 MED ORDER — FUROSEMIDE 80 MG PO TABS: ORAL_TABLET | ORAL | 12 refills | Status: DC

## 2017-08-27 NOTE — Assessment & Plan Note (Signed)
History of essential hypertension/labile hypertension blood pressure measured at 178/92. This stimulated higher than his blood pressures have usually run and he also has shown the blood pressure readings when he is relatively hypotensive. This occurs when he's feeling presyncopal shortness of breath and tachycardia palpitations. His medications currently include evening hydralazine and low-dose metoprolol.

## 2017-08-27 NOTE — Assessment & Plan Note (Signed)
History of hyperlipidemia currently on pravastatin low dose. We will recheck a lipid and liver profile.

## 2017-08-27 NOTE — Patient Instructions (Signed)
Medication Instructions:   CHANGE FUROSEMIDE TO 80 MG ALTERNATING WITH 40 MG EVERY OTHER DAY=1 TABLET ALTERNATING WITH 1/2 TABLET EVERY OTHER DAY  Labwork:  Your physician recommends that you HAVE LAB WORK TODAY  Testing/Procedures:  Your physician has requested that you have a lexiscan myoview. For further information please visit [email or web address] Please follow instruction sheet, as given.   Your physician has requested that you have an echocardiogram. Echocardiography is a painless test that uses sound waves to create images of your heart. It provides your doctor with information about the size and shape of your heart and how well your heart's chambers and valves are working. This procedure takes approximately one hour. There are no restrictions for this procedure.    Follow-Up:  Your physician recommends that you schedule a follow-up appointment in: AFTER TESTING COMPLETE WITH DR Allyson Sabal   If you need a refill on your cardiac medications before your next appointment, please call your pharmacy.

## 2017-08-27 NOTE — Progress Notes (Signed)
08/27/2017 Johnathan Keith   Nov 06, 1961  [id number]  Primary Physician Sistasis, Johnathan Ashing, MD Primary Cardiologist: Johnathan Gess MD Johnathan Keith, [place], [place]  HPI:  Johnathan Keith is a 56 y.o. male mildly overweight married Caucasian male who I last saw in the office 08/27/16.  In addition to hypertension, his cardiovascular history is significant for prior TIA subsequent to carotid artery disease. He underwent right carotid endarterectomy performed by Dr. [name] on 09/26/2014. There is also question of remote myocardial infarction in the past. He recently underwent evaluation with a 2-D echocardiogram that was performed preoperatively which showed normal LV function as well as a Myoview stress test that had subtle inferior and lateral ischemia. At that time he had no SOB or chest pain and was cleared for surgery. His history is also notable for hyperlipidemia and diabetes. He was last seen by Dr. Allyson Keith 10/26/2014. During that time, he complained of symptomatic hypotension with systolic blood pressures in the low 90s. Subsequently Dr. Allyson Keith decided to decrease his lisinopril down from 40 mg to 20 mg daily and now down to 10 mg daily. He is taking a forth of 40 mg table. Since that time he has been seen by our office pharmacist Phillips Hay for labile blood pressures. Though over all BP is improved. He is on most of his meds.   He has been on lasix 80 mg daily for a year for lower ext. Edema. His K+ has been elevated at times and currently he is off the kdur.His most recent lipid profile in our chart for/13/16 revealed total cholesterol 132, LDL 54 and HDL of 34.  In addition to lower ext edema he has DOE. No chest pain but walking in grocery store and to car he is very SOB. This has progressed since Nuc study. He has several risk factors: DM, hyperlipidemia, vascular disease and family hx CAD. His nuclear study was high risk for anterolateral and inferior ischemia. Based on this, and in  light of his increasing dyspnea on exertion as a potential anginal equivalent, he underwent diagnostic coronary arteriography by myself 03/23/15 revealing proximal and mid LAD stenoses which I intervened on with drug-eluting stents. The circumflex and RCA were free of significant disease as LV function was preserved. Since intervention the symptoms have markedly improved.   since I saw him a year ago he's remained clinically stable. He underwent nasal surgery 06/04/16. He saw Corine Shelter in the office 08/02/16 was normal as well. He continues to complain of some dyspnea on exertion however.  Since I saw him a year ago he has noticed some recent weight gain thought to be related to fluid accumulation as well as dyspnea with associated chest tightness over the last 6 months. His diuretics were recently adjusted. He has gained 10-15 pounds and has 1-2+ pitting edema.   Current Meds  Medication Sig  . aspirin 81 MG chewable tablet Chew 1 tablet (81 mg total) by mouth daily.  . Cholecalciferol (VITAMIN D) 2000 UNITS tablet Take 2,000 Units by mouth daily.  . clopidogrel (PLAVIX) 75 MG tablet Take 1 tablet (75 mg total) by mouth daily with breakfast.  . cyclobenzaprine (FLEXERIL) 5 MG tablet Take 5 mg by mouth as directed.  . dapagliflozin propanediol (FARXIGA) 10 MG TABS tablet Take 10 mg by mouth daily.   Johnathan Keith dicyclomine (BENTYL) 10 MG capsule Take 10 mg by mouth 3 (three) times daily before meals.   . diphenoxylate-atropine (LOMOTIL) 2.5-0.025 MG tablet Take 1 tablet by  mouth daily as needed for diarrhea or loose stools.  . fluticasone (FLONASE) 50 MCG/ACT nasal spray Place 1 spray into both nostrils daily as needed.  . furosemide (LASIX) 80 MG tablet Take 1 tablet (80 mg total) by mouth every other day.  . gabapentin (NEURONTIN) 100 MG capsule Take 1 capsule (100 mg total) by mouth 3 (three) times daily.  Johnathan Keith glimepiride (AMARYL) 4 MG tablet Take 4 mg by mouth 2 (two) times daily.  . hydrALAZINE  (APRESOLINE) 25 MG tablet TAKE 1 TABLET BY MOUTH AT BEDTIME  . insulin NPH Human (HUMULIN N,NOVOLIN N) 100 UNIT/ML injection Inject into the skin as directed. Pt takes 55 units in the morning and 60 units at bedtime  . isosorbide mononitrate (IMDUR) 30 MG 24 hr tablet TAKE ONE-HALF TABLET BY MOUTH ONCE DAILY  . loperamide (IMODIUM) 2 MG capsule Take by mouth daily as needed for diarrhea or loose stools.  . metFORMIN (GLUCOPHAGE-XR) 500 MG 24 hr tablet Take 500 mg by mouth 2 (two) times daily.  . metoprolol succinate (TOPROL-XL) 25 MG 24 hr tablet Take 0.5 tablets (12.5 mg total) by mouth daily.  . nitroGLYCERIN (NITROSTAT) 0.4 MG SL tablet Place 1 tablet (0.4 mg total) under the tongue every 5 (five) minutes as needed for chest pain.  . pantoprazole (PROTONIX) 40 MG tablet Take 40 mg by mouth daily.  Johnathan Keith Glycol-Propyl Glycol (SYSTANE OP) Place 1 drop into both eyes 2 (two) times daily.  . pravastatin (PRAVACHOL) 10 MG tablet Take 10 mg by mouth daily.  . ranitidine (ZANTAC) 300 MG tablet Take 1 tablet by mouth at bedtime.  . tamsulosin (FLOMAX) 0.4 MG CAPS capsule Take 1 capsule (0.4 mg total) by mouth daily.     No Known Allergies  Social History   Social History  . Marital status: Married    Spouse name: N/A  . Number of children: 5  . Years of education: 12th   Occupational History  . Disabled    Social History Main Topics  . Smoking status: Never Smoker  . Smokeless tobacco: Never Used  . Alcohol use No  . Drug use: No  . Sexual activity: Not Currently    Partners: Female   Other Topics Concern  . Not on file   Social History Narrative   Patient is married with 5 children.    Patient is right handed.   Patient has hs education.   Patient drinks 4 12 oz can sodas daily.     Review of Systems: General: negative for chills, fever, night sweats or weight changes.  Cardiovascular: negative for chest pain, dyspnea on exertion, edema, orthopnea, palpitations,  paroxysmal nocturnal dyspnea or shortness of breath Dermatological: negative for rash Respiratory: negative for cough or wheezing Urologic: negative for hematuria Abdominal: negative for nausea, vomiting, diarrhea, bright red blood per rectum, melena, or hematemesis Neurologic: negative for visual changes, syncope, or dizziness All other systems reviewed and are otherwise negative except as noted above.    Blood pressure (!) 178/92, pulse 81, height  (1.676 m), weight 221 lb 9.6 oz (100.5 kg).  General appearance: alert and no distress Neck: no adenopathy, no carotid bruit, no JVD, supple, symmetrical, trachea midline and thyroid not enlarged, symmetric, no tenderness/mass/nodules Lungs: clear to auscultation bilaterally Heart: regular rate and rhythm, S1, S2 normal, no murmur, click, rub or gallop Extremities: 1-2+ pitting edema bilaterally Pulses: 2+ and symmetric Skin: Skin color, texture, turgor normal. No rashes or lesions Neurologic: Alert and oriented X  3, normal strength and tone. Normal symmetric reflexes. Normal coordination and gait  EKG sinus rhythm 81 without ST or T-wave changes. I personally reviewed this EKG.  ASSESSMENT AND PLAN:   Carotid artery stenosis- History of carotid artery disease status post right carotid end arterectomy by Dr. [name] in October 2015 when she follows  Hyperlipidemia History of hyperlipidemia currently on pravastatin low dose. We will recheck a lipid and liver profile.  Essential hypertension History of essential hypertension/labile hypertension blood pressure measured at 178/92. This stimulated higher than his blood pressures have usually run and he also has shown the blood pressure readings when he is relatively hypotensive. This occurs when he's feeling presyncopal shortness of breath and tachycardia palpitations. His medications currently include evening hydralazine and low-dose metoprolol.  CAD -S/P PCI-2016 History of CAD status  post proximal and mid LAD drug-eluting stenting by myself 03/23/15 after a high-risk Myoview was performed revealing anterolateral and inferior ischemia. Here normal circumflex and RCA with normal LV function. Symptoms resolved after that. Over the last 6 months or so he's had recurrent dyspnea on exertion with chest tightness. She also had some weight gain related to fluid accumulation. I'm going to obtain a 2-D echocardiogram and pharmacologic Myoview stress test to further evaluate.       Johnathan Gess MD FACP,FACC,FAHA, [facility] 08/27/2017 10:30 AM

## 2017-08-27 NOTE — Assessment & Plan Note (Signed)
History of CAD status post proximal and mid LAD drug-eluting stenting by myself 03/23/15 after a high-risk Myoview was performed revealing anterolateral and inferior ischemia. Here normal circumflex and RCA with normal LV function. Symptoms resolved after that. Over the last 6 months or so he's had recurrent dyspnea on exertion with chest tightness. She also had some weight gain related to fluid accumulation. I'm going to obtain a 2-D echocardiogram and pharmacologic Myoview stress test to further evaluate.

## 2017-08-27 NOTE — Assessment & Plan Note (Signed)
History of carotid artery disease status post right carotid end arterectomy by Dr. [name] in October 2015 when she follows

## 2017-08-28 ENCOUNTER — Telehealth: Payer: Self-pay | Admitting: *Deleted

## 2017-08-28 ENCOUNTER — Telehealth: Payer: Self-pay | Admitting: Cardiovascular Disease

## 2017-08-28 NOTE — Telephone Encounter (Signed)
Pt said please tell wife the information that you need to give him. If she does not answer,please leave a detailed message.

## 2017-08-28 NOTE — Telephone Encounter (Signed)
Results given to the patient's wife, per the dpr. She verbalized her understanding.

## 2017-08-28 NOTE — Telephone Encounter (Signed)
Left message to call back with results:  Notes recorded by Runell Gess, MD on 08/28/2017 at 7:33 AM EDT Excellent FLP and LFTs

## 2017-09-04 ENCOUNTER — Telehealth (HOSPITAL_COMMUNITY): Payer: Self-pay

## 2017-09-04 NOTE — Telephone Encounter (Signed)
Encounter complete. 

## 2017-09-05 ENCOUNTER — Other Ambulatory Visit: Payer: Self-pay

## 2017-09-05 ENCOUNTER — Ambulatory Visit (HOSPITAL_COMMUNITY): Payer: Medicare HMO | Attending: Cardiovascular Disease

## 2017-09-05 DIAGNOSIS — R0602 Shortness of breath: Secondary | ICD-10-CM | POA: Diagnosis not present

## 2017-09-05 DIAGNOSIS — I2510 Atherosclerotic heart disease of native coronary artery without angina pectoris: Secondary | ICD-10-CM | POA: Insufficient documentation

## 2017-09-05 DIAGNOSIS — Z8673 Personal history of transient ischemic attack (TIA), and cerebral infarction without residual deficits: Secondary | ICD-10-CM | POA: Insufficient documentation

## 2017-09-05 DIAGNOSIS — E785 Hyperlipidemia, unspecified: Secondary | ICD-10-CM | POA: Diagnosis not present

## 2017-09-05 DIAGNOSIS — I119 Hypertensive heart disease without heart failure: Secondary | ICD-10-CM | POA: Insufficient documentation

## 2017-09-05 DIAGNOSIS — I6529 Occlusion and stenosis of unspecified carotid artery: Secondary | ICD-10-CM | POA: Diagnosis not present

## 2017-09-09 ENCOUNTER — Ambulatory Visit (HOSPITAL_COMMUNITY)
Admission: RE | Admit: 2017-09-09 | Discharge: 2017-09-09 | Disposition: A | Discharge status: Home / Self Care | Payer: Medicare HMO | Source: Ambulatory Visit | Attending: Cardiovascular Disease | Admitting: Cardiovascular Disease

## 2017-09-09 DIAGNOSIS — R072 Precordial pain: Secondary | ICD-10-CM | POA: Diagnosis present

## 2017-09-09 LAB — MYOCARDIAL PERFUSION IMAGING
LV dias vol: 85 mL (ref 62–150)
LV sys vol: 34 mL
Peak HR: 103 {beats}/min
Rest HR: 93 {beats}/min
SDS: 4
SRS: 1
SSS: 4
TID: 1.22

## 2017-09-09 IMAGING — NM NM MISC PROCEDURE
9 series · 54 of 54 positions shown · non-contrast
Comparison: none

[Series 1: wbr_r-proj_st wbr rest · 6.40mm/px · 6 of 64 frames shown]
[frame 6/64]
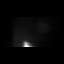
[frame 16/64]
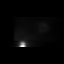
[frame 27/64]
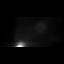
[frame 38/64]
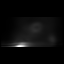
[frame 48/64]
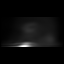
[frame 59/64]
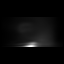

[Series 1: rest sax · 6.4mm · 6.40mm/px · 6 of 23 frames shown]
[frame 2/23]
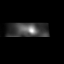
[frame 6/23]
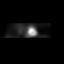
[frame 10/23]
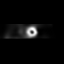
[frame 14/23]
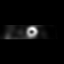
[frame 18/23]
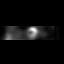
[frame 22/23]
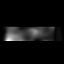

[Series 1: wbr rest · 6.40mm/px · 6 of 64 frames shown]
[frame 6/64]
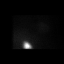
[frame 16/64]
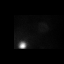
[frame 27/64]
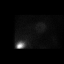
[frame 38/64]
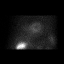
[frame 48/64]
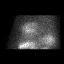
[frame 59/64]
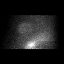

[Series 2: wbr stress-gsp · 6.40mm/px · 6 of 511 frames shown]
[frame 43/511  full-range]
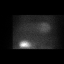
[frame 128/511  full-range]
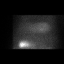
[frame 213/511  full-range]
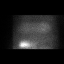
[frame 298/511  full-range]
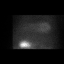
[frame 383/511  full-range]
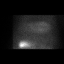
[frame 469/511  full-range]
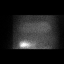

[Series 2: wbr_s-proj_st wbr stress-gsp · 6.40mm/px · 6 of 512 frames shown]
[frame 43/512]
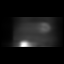
[frame 128/512]
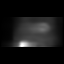
[frame 214/512]
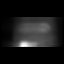
[frame 299/512]
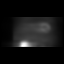
[frame 384/512]
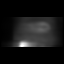
[frame 470/512]
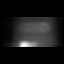

[Series 2: stress sax · 6.4mm · 6.40mm/px · 6 of 23 frames shown]
[frame 2/23]
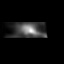
[frame 6/23]
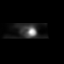
[frame 10/23]
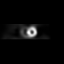
[frame 14/23]
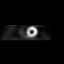
[frame 18/23]
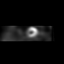
[frame 22/23]
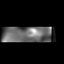

[Series 2: stress sax gs · 6.4mm · 6.40mm/px · 6 of 184 frames shown]
[frame 16/184]
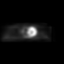
[frame 46/184]
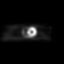
[frame 77/184]
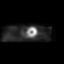
[frame 108/184]
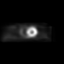
[frame 138/184]
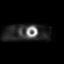
[frame 169/184]
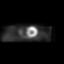

[Series 3: wbr_s-proj_st wbr stress-sum-em · 6.40mm/px · 6 of 64 frames shown]
[frame 6/64]
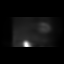
[frame 16/64]
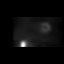
[frame 27/64]
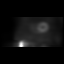
[frame 38/64]
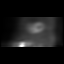
[frame 48/64]
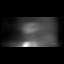
[frame 59/64]
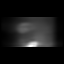

[Series 3: wbr stress-sum-em · 6.40mm/px · 6 of 64 frames shown]
[frame 6/64]
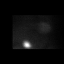
[frame 16/64]
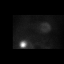
[frame 27/64]
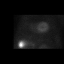
[frame 38/64]
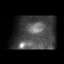
[frame 48/64]
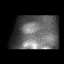
[frame 59/64]
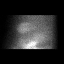

[54 of 54 positions shown; findings below may reference images not displayed]

Canned report from images found in remote index.

Refer to host system for actual result text.

## 2017-09-09 MED ORDER — REGADENOSON 0.4 MG/5ML IV SOLN
0.4000 mg | Freq: Once | INTRAVENOUS | Status: AC
  Administered 2017-09-09: 0.4 mg via INTRAVENOUS

## 2017-09-09 MED ORDER — TECHNETIUM TC 99M TETROFOSMIN IV KIT
29.3000 | PACK | Freq: Once | INTRAVENOUS | Status: AC | PRN
  Administered 2017-09-09: 29.3 via INTRAVENOUS
  Filled 2017-09-09: qty 30

## 2017-09-09 MED ORDER — TECHNETIUM TC 99M TETROFOSMIN IV KIT
9.8000 | PACK | Freq: Once | INTRAVENOUS | Status: AC | PRN
  Administered 2017-09-09: 9.8 via INTRAVENOUS
  Filled 2017-09-09: qty 10

## 2017-09-24 ENCOUNTER — Encounter: Payer: Self-pay | Admitting: Cardiovascular Disease

## 2017-09-24 ENCOUNTER — Ambulatory Visit (INDEPENDENT_AMBULATORY_CARE_PROVIDER_SITE_OTHER): Payer: Medicare HMO | Admitting: Cardiovascular Disease

## 2017-09-24 ENCOUNTER — Ambulatory Visit
Admission: RE | Admit: 2017-09-24 | Discharge: 2017-09-24 | Disposition: A | Discharge status: Home / Self Care | Payer: Medicare HMO | Source: Ambulatory Visit | Attending: Cardiovascular Disease | Admitting: Cardiovascular Disease

## 2017-09-24 VITALS — BP 142/81 | HR 88 | Ht 66.0 in | Wt 223.0 lb

## 2017-09-24 DIAGNOSIS — R0602 Shortness of breath: Secondary | ICD-10-CM | POA: Diagnosis not present

## 2017-09-24 IMAGING — CR DG CHEST 2V
2 series · 2 of 2 positions shown · non-contrast
Comparison: [DATE] chest CT

CLINICAL DATA: Preop for heart catheterization [DATE].
Shortness of breath.

EXAM:
CHEST  2 VIEW

[w chest pa]
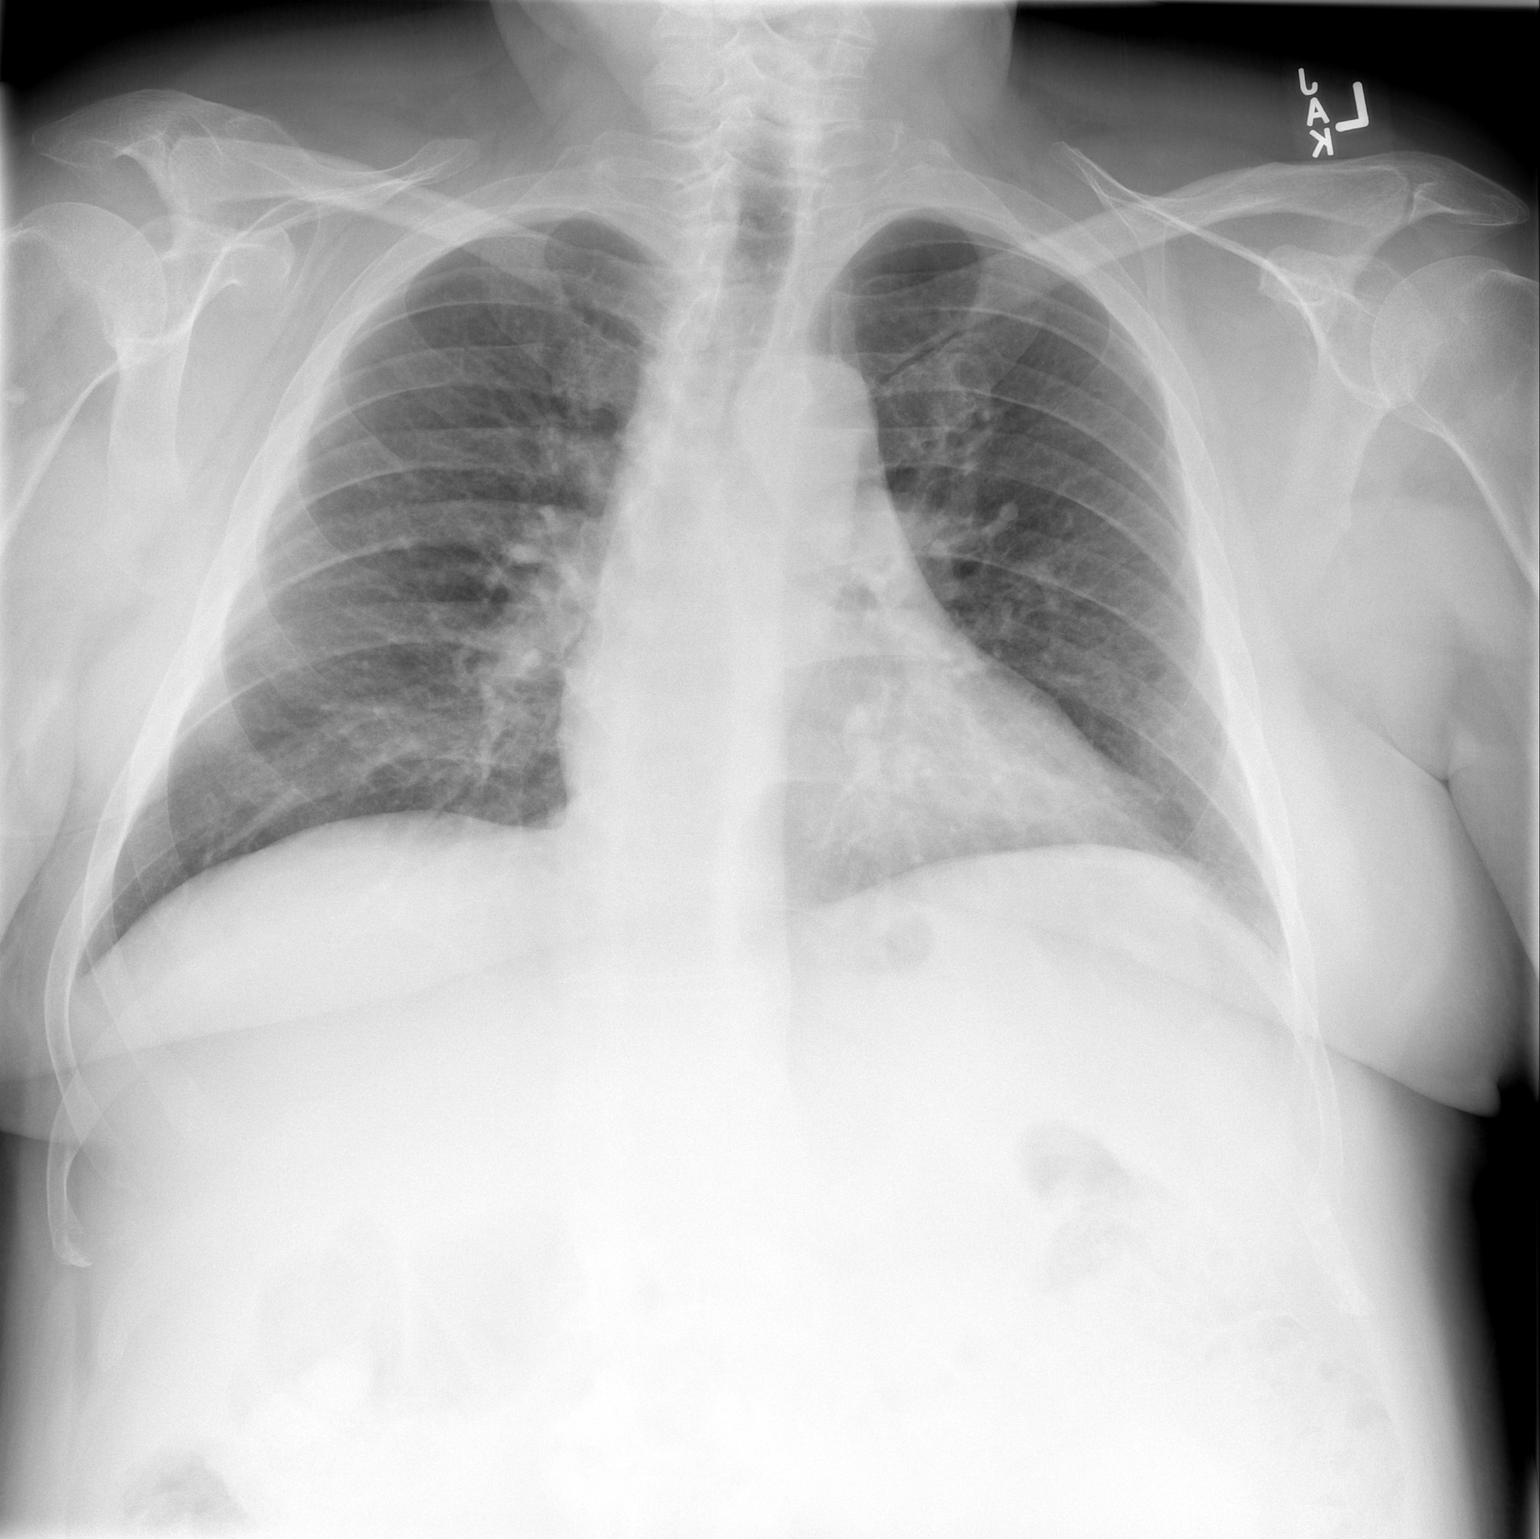

[w chest lat]
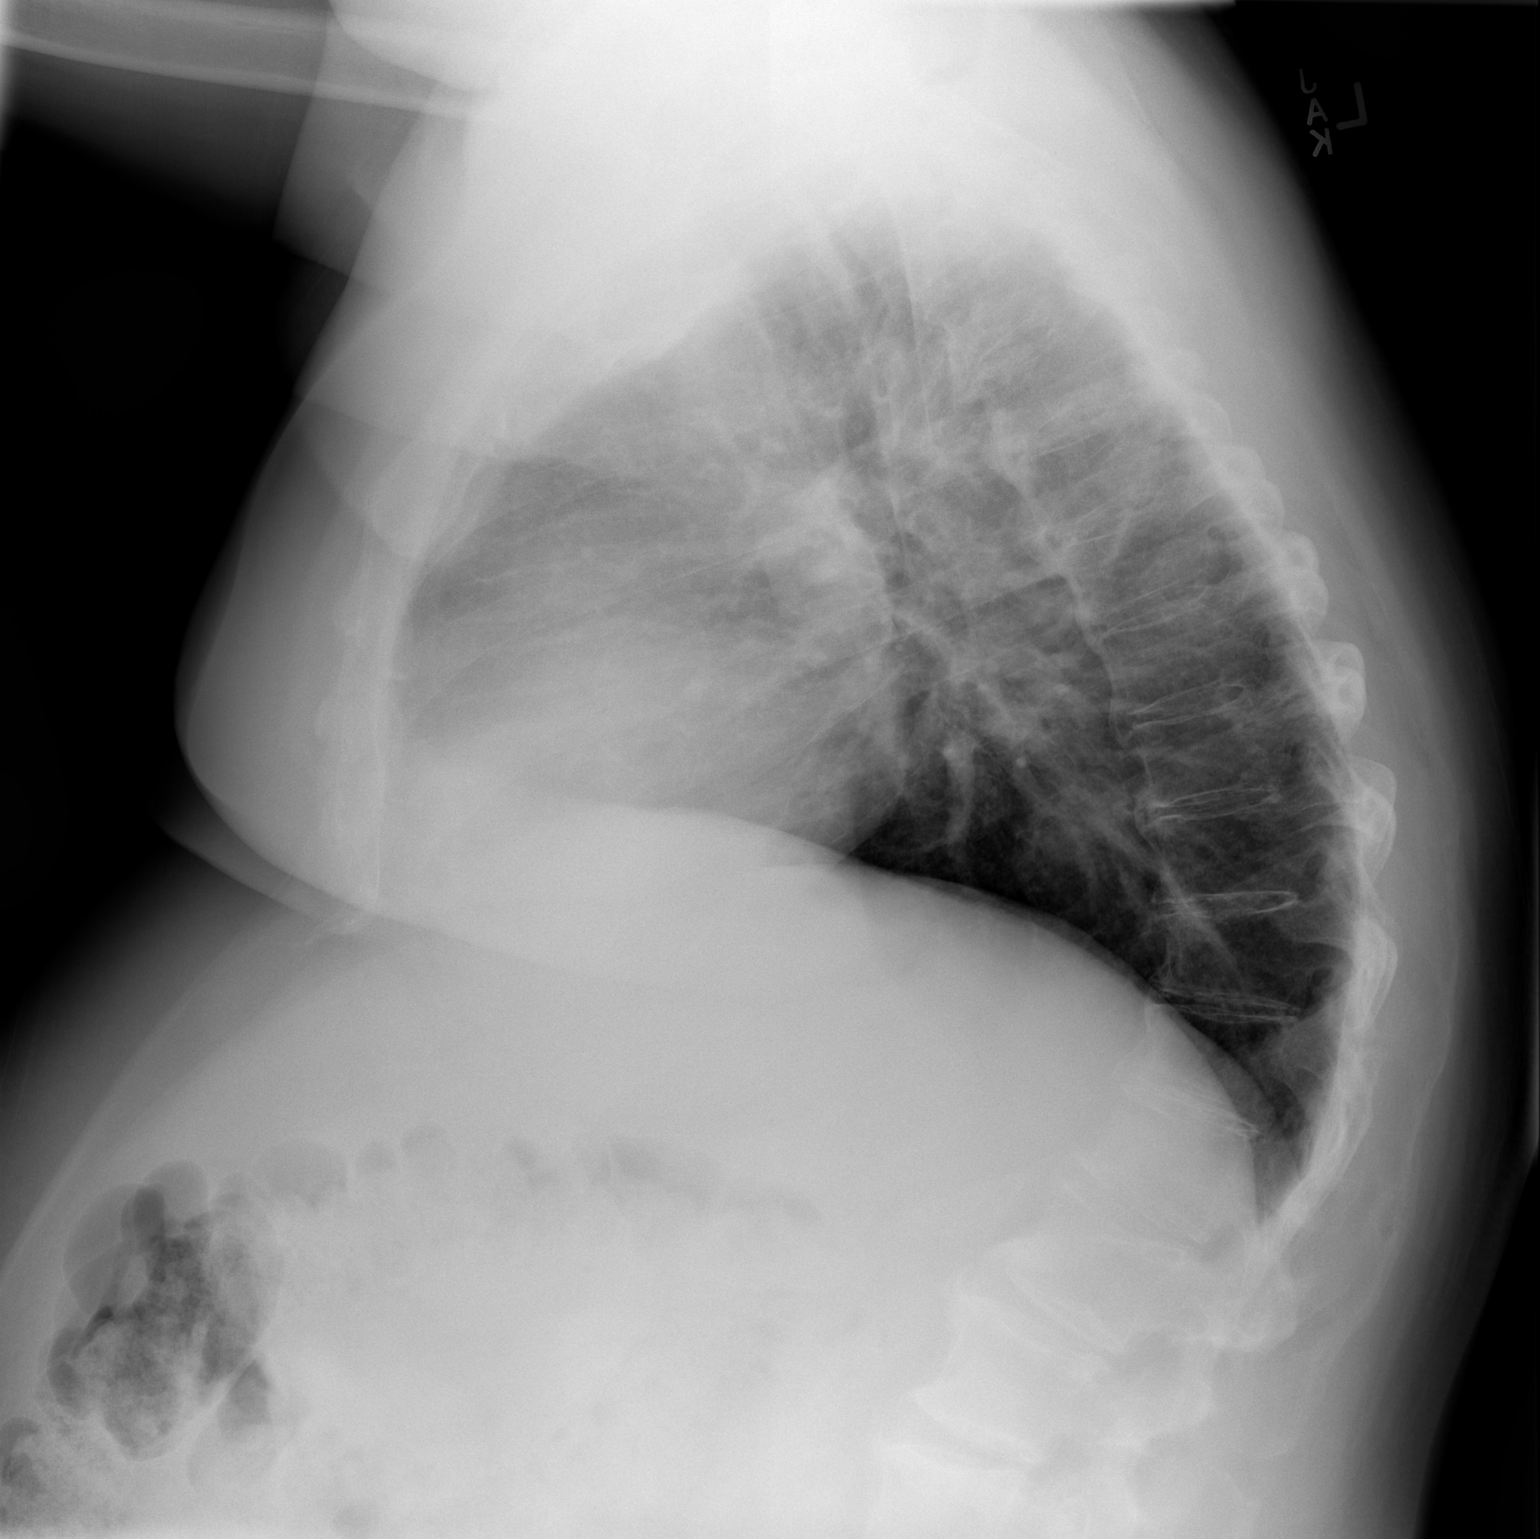

[2 of 2 positions shown; findings below may reference images not displayed]

FINDINGS: Normal heart size. Unremarkable mediastinal contours. Low volume
chest which is likely accentuated by kyphosis. Diffuse spondylosis.
IMPRESSION: No evidence of active disease. Stable exam compared to [DATE]

## 2017-09-24 NOTE — Assessment & Plan Note (Signed)
Johnathan Keith returns for follow-up of his diagnostic test. 2-D echocardiogram and Myoview stress test were completely normal. He continues to have symptoms of dyspnea. These are symptoms similar to this preintervention symptoms 2-1/2 years ago. Based on this, I am going to proceed with outpatient diagnostic coronary angiography to define his anatomy and rule out an ischemic etiology.The patient understands that risks included but are not limited to stroke (1 in 1000), death (1 in 1000), kidney failure [usually temporary] (1 in 500), bleeding (1 in 200), allergic reaction [possibly serious] (1 in 200). The patient understands and agrees to proceed

## 2017-09-24 NOTE — Patient Instructions (Signed)
   [facility] CARDIOVASCULAR DIVISION [facility] [address] [place] [address] Dept: [phone] Loc: [phone]  Maximos Kralicek  09/24/2017  You are scheduled for a Cardiac Catheterization on Monday, October 22 with Dr. Nanetta Batty.  1. Please arrive at the [facility] (Main Entrance A) at [facility]: [address] [place], [place] [address] at 11:30 AM (two hours before your procedure to ensure your preparation). Free valet parking service is available.   Special note: Every effort is made to have your procedure done on time. Please understand that emergencies sometimes delay scheduled procedures.  2. Diet: Do not eat or drink anything after midnight prior to your procedure except sips of water to take medications.  3. Labs: Pleas have labs done in our office today. 4. Medication instructions in preparation for your procedure:  Take only 30 units of insulin the night before your procedure. Do not take any insulin on the day of the procedure.   Do NOT take Metformin or Farxiga the day before or the day of your procedure.   Do NOT take Glimeperide the night before or the morning of your procedure.  On the morning of your procedure, take your aspirin and Plavix/Clopidogrel and any morning medicines NOT listed above.  You may use sips of water.  5. Plan for one night stay--bring personal belongings. 6. Bring a current list of your medications and current insurance cards. 7. You MUST have a responsible person to drive you home. 8. Someone MUST be with you the first 24 hours after you arrive home or your discharge will be delayed. 9. Please wear clothes that are easy to get on and off and wear slip-on shoes.  Thank you for allowing [place] to care for you!   -- [facility]   Follow-up: Your physician recommends that you schedule a follow-up appointment 2 weeks after CATH with Dr.  Allyson Sabal.

## 2017-09-24 NOTE — Progress Notes (Signed)
09/24/2017 Johnathan Keith   June 08, 1961  [id number]  Primary Physician Sistasis, Rosanne AshingJim, MD Primary Cardiologist: Runell GessJonathan J Slater Mcmanaman MD Milagros LollFACP, FACC, [place], [place]  HPI:  Johnathan Keith is a 56 y.o. male mildly overweight married Caucasian male who I last saw in the office 08/27/17.  In addition to hypertension, his cardiovascular history is significant for prior TIA subsequent to carotid artery disease. He underwent right carotid endarterectomy performed by Dr. Imogene Burnhen on 09/26/2014. There is also question of remote myocardial infarction in the past. He recently underwent evaluation with a 2-D echocardiogram that was performed preoperatively which showed normal LV function as well as a Myoview stress test that had subtle inferior and lateral ischemia. At that time he had no SOB or chest pain and was cleared for surgery. His history is also notable for hyperlipidemia and diabetes. He was last seen by Dr. Allyson SabalBerry 10/26/2014. During that time, he complained of symptomatic hypotension with systolic blood pressures in the low 90s. Subsequently Dr. Allyson SabalBerry decided to decrease his lisinopril down from 40 mg to 20 mg daily and now down to 10 mg daily. He is taking a forth of 40 mg table. Since that time he has been seen by our office pharmacist Phillips HayKristin Alvstad for labile blood pressures. Though over all BP is improved. He is on most of his meds.   He has been on lasix 80 mg daily for a year for lower ext. Edema. His K+ has been elevated at times and currently he is off the kdur.His most recent lipid profile in our chart for/13/16 revealed total cholesterol 132, LDL 54 and HDL of 34.  In addition to lower ext edema he has DOE. No chest pain but walking in grocery store and to car he is very SOB. This has progressed since Nuc study. He has several risk factors: DM, hyperlipidemia, vascular disease and family hx CAD. His nuclear study was high risk for anterolateral and inferior ischemia. Based on this, and  in light of his increasing dyspnea on exertion as a potential anginal equivalent, he underwent diagnostic coronary arteriography by myself 03/23/15 revealing proximal and mid LAD stenoses which I intervened on with drug-eluting stents. The circumflex and RCA were free of significant disease as LV function was preserved. Since intervention the symptoms have markedly improved.   since I saw him a year ago he's remained clinically stable. He underwent nasal surgery 06/04/16. He saw Corine ShelterLuke Kilroy in the office 08/02/16 was normal as well. He continues to complain of some dyspnea on exertion however.  Since I saw him a year ago he has noticed some recent weight gain thought to be related to fluid accumulation as well as dyspnea with associated chest tightness over the last 6 months. His diuretics were recently adjusted.   Since I saw him in the office approximately a month ago I did obtain a 2-D echocardiogram on 09/05/17 as well as a Myoview stress test on 09/09/17 both of which were normal. He continues to have symptoms of dyspnea which he says are similar to his preintervention symptoms.   Current Meds  Medication Sig  . aspirin 81 MG chewable tablet Chew 1 tablet (81 mg total) by mouth daily.  . Cholecalciferol (VITAMIN D) 2000 UNITS tablet Take 2,000 Units by mouth daily.  . clopidogrel (PLAVIX) 75 MG tablet Take 1 tablet (75 mg total) by mouth daily with breakfast.  . cyclobenzaprine (FLEXERIL) 5 MG tablet Take 5 mg by mouth as directed.  . dapagliflozin propanediol (FARXIGA)  10 MG TABS tablet Take 10 mg by mouth daily.   Marland Kitchen dicyclomine (BENTYL) 10 MG capsule Take 10 mg by mouth 4 (four) times daily.   . diphenoxylate-atropine (LOMOTIL) 2.5-0.025 MG tablet Take 1 tablet by mouth daily as needed for diarrhea or loose stools.  . fluticasone (FLONASE) 50 MCG/ACT nasal spray Place 1 spray into both nostrils daily as needed.  . furosemide (LASIX) 80 MG tablet Take 1 tablet alternating with 1/2 tablet every  other day  . gabapentin (NEURONTIN) 100 MG capsule Take 1 capsule (100 mg total) by mouth 3 (three) times daily.  Marland Kitchen glimepiride (AMARYL) 4 MG tablet Take 4 mg by mouth 2 (two) times daily.  . hydrALAZINE (APRESOLINE) 25 MG tablet TAKE 1 TABLET BY MOUTH AT BEDTIME  . insulin NPH Human (HUMULIN N,NOVOLIN N) 100 UNIT/ML injection Inject into the skin as directed. Pt takes 55 units in the morning and 60 units at bedtime  . isosorbide mononitrate (IMDUR) 30 MG 24 hr tablet TAKE ONE-HALF TABLET BY MOUTH ONCE DAILY  . loperamide (IMODIUM) 2 MG capsule Take by mouth daily as needed for diarrhea or loose stools.  . metFORMIN (GLUCOPHAGE-XR) 500 MG 24 hr tablet Take 500 mg by mouth 2 (two) times daily.  . metoprolol succinate (TOPROL-XL) 25 MG 24 hr tablet Take 0.5 tablets (12.5 mg total) by mouth daily.  . nitroGLYCERIN (NITROSTAT) 0.4 MG SL tablet Place 1 tablet (0.4 mg total) under the tongue every 5 (five) minutes as needed for chest pain.  . pantoprazole (PROTONIX) 40 MG tablet Take 40 mg by mouth daily.  Bertram Gala Glycol-Propyl Glycol (SYSTANE OP) Place 1 drop into both eyes 2 (two) times daily.  . pravastatin (PRAVACHOL) 10 MG tablet Take 10 mg by mouth daily.  . ranitidine (ZANTAC) 300 MG tablet Take 1 tablet by mouth at bedtime.  . tamsulosin (FLOMAX) 0.4 MG CAPS capsule Take 1 capsule (0.4 mg total) by mouth daily.     No Known Allergies  Social History   Social History  . Marital status: Married    Spouse name: N/A  . Number of children: 5  . Years of education: 12th   Occupational History  . Disabled    Social History Main Topics  . Smoking status: Never Smoker  . Smokeless tobacco: Never Used  . Alcohol use No  . Drug use: No  . Sexual activity: Not Currently    Partners: Female   Other Topics Concern  . Not on file   Social History Narrative   Patient is married with 5 children.    Patient is right handed.   Patient has hs education.   Patient drinks 4 12 oz can  sodas daily.     Review of Systems: General: negative for chills, fever, night sweats or weight changes.  Cardiovascular: negative for chest pain, dyspnea on exertion, edema, orthopnea, palpitations, paroxysmal nocturnal dyspnea or shortness of breath Dermatological: negative for rash Respiratory: negative for cough or wheezing Urologic: negative for hematuria Abdominal: negative for nausea, vomiting, diarrhea, bright red blood per rectum, melena, or hematemesis Neurologic: negative for visual changes, syncope, or dizziness All other systems reviewed and are otherwise negative except as noted above.    Blood pressure (!) 142/81, pulse 88, height 5\' 6"  (1.676 m), weight 223 lb (101.2 kg).  General appearance: alert and no distress Neck: no adenopathy, no carotid bruit, no JVD, supple, symmetrical, trachea midline and thyroid not enlarged, symmetric, no tenderness/mass/nodules Lungs: clear to auscultation bilaterally Heart: regular  rate and rhythm, S1, S2 normal, no murmur, click, rub or gallop Extremities: extremities normal, atraumatic, no cyanosis or edema  EKG not performed today  ASSESSMENT AND PLAN:   Dyspnea on exertion Johnathan Keith returns for follow-up of his diagnostic test. 2-D echocardiogram and Myoview stress test were completely normal. He continues to have symptoms of dyspnea. These are symptoms similar to this preintervention symptoms 2-1/2 years ago. Based on this, I am going to proceed with outpatient diagnostic coronary angiography to define his anatomy and rule out an ischemic etiology.The patient understands that risks included but are not limited to stroke (1 in 1000), death (1 in 1000), kidney failure [usually temporary] (1 in 500), bleeding (1 in 200), allergic reaction [possibly serious] (1 in 200). The patient understands and agrees to proceed       Runell Gess MD [facility], [facility] 09/24/2017 12:08 PM

## 2017-09-25 ENCOUNTER — Other Ambulatory Visit: Payer: Self-pay | Admitting: Cardiovascular Disease

## 2017-09-25 ENCOUNTER — Telehealth: Payer: Self-pay

## 2017-09-25 LAB — CBC WITH DIFFERENTIAL/PLATELET
Basophils Absolute: 0 10*3/uL (ref 0.0–0.2)
Basos: 0 %
EOS (ABSOLUTE): 0.2 10*3/uL (ref 0.0–0.4)
Eos: 2 %
Hematocrit: 43.6 % (ref 37.5–51.0)
Hemoglobin: 14.5 g/dL (ref 13.0–17.7)
Immature Grans (Abs): 0 10*3/uL (ref 0.0–0.1)
Immature Granulocytes: 0 %
Lymphocytes Absolute: 3.1 10*3/uL (ref 0.7–3.1)
Lymphs: 33 %
MCH: 28.5 pg (ref 26.6–33.0)
MCHC: 33.3 g/dL (ref 31.5–35.7)
MCV: 86 fL (ref 79–97)
Monocytes Absolute: 0.7 10*3/uL (ref 0.1–0.9)
Monocytes: 7 %
Neutrophils Absolute: 5.4 10*3/uL (ref 1.4–7.0)
Neutrophils: 58 %
Platelets: 300 10*3/uL (ref 150–379)
RBC: 5.08 x10E6/uL (ref 4.14–5.80)
RDW: 15.6 % — ABNORMAL HIGH (ref 12.3–15.4)
WBC: 9.3 10*3/uL (ref 3.4–10.8)

## 2017-09-25 LAB — BASIC METABOLIC PANEL
BUN/Creatinine Ratio: 22 — ABNORMAL HIGH (ref 9–20)
BUN: 18 mg/dL (ref 6–24)
CO2: 26 mmol/L (ref 20–29)
Calcium: 9.8 mg/dL (ref 8.7–10.2)
Chloride: 101 mmol/L (ref 96–106)
Creatinine, Ser: 0.82 mg/dL (ref 0.76–1.27)
GFR calc Af Amer: 113 mL/min/{1.73_m2} (ref >59)
GFR calc non Af Amer: 97 mL/min/{1.73_m2} (ref >59)
Glucose: 85 mg/dL (ref 65–99)
Potassium: 4.2 mmol/L (ref 3.5–5.2)
Sodium: 145 mmol/L — ABNORMAL HIGH (ref 134–144)

## 2017-09-25 LAB — PROTIME-INR
INR: 1 (ref 0.8–1.2)
Prothrombin Time: 10.6 s (ref 9.1–12.0)

## 2017-09-25 LAB — TSH: TSH: 2.14 u[IU]/mL (ref 0.450–4.500)

## 2017-09-25 LAB — APTT: aPTT: 31 s (ref 24–33)

## 2017-09-25 NOTE — Telephone Encounter (Signed)
Left detailed message per DPR.  Patient contacted pre-catheterization at [facility] scheduled for:  09/29/2017 @ 1330 Verified arrival time and place:  NT @ 1130 Confirmed AM meds to be taken pre-cath with sip of water: Take ASA/Plavix Hold Metformin 24 hours-after 48 hours AM of-hold Amaryl/Farxiga/Lasix Night before take half dose Lantus (30 units) Addl concerns:  Left this nurse name and # for call back if any questions/concerns

## 2017-09-29 ENCOUNTER — Ambulatory Visit (HOSPITAL_COMMUNITY)
Admission: RE | Admit: 2017-09-29 | Discharge: 2017-09-29 | Disposition: A | Discharge status: Home / Self Care | Payer: Medicare HMO | Source: Ambulatory Visit | Attending: Cardiovascular Disease | Admitting: Cardiovascular Disease

## 2017-09-29 ENCOUNTER — Encounter (HOSPITAL_COMMUNITY)
Admission: RE | Disposition: A | Discharge status: Home / Self Care | Payer: Self-pay | Source: Ambulatory Visit | Attending: Cardiovascular Disease

## 2017-09-29 DIAGNOSIS — I10 Essential (primary) hypertension: Secondary | ICD-10-CM | POA: Diagnosis not present

## 2017-09-29 DIAGNOSIS — Z794 Long term (current) use of insulin: Secondary | ICD-10-CM | POA: Diagnosis not present

## 2017-09-29 DIAGNOSIS — Z6835 Body mass index (BMI) 35.0-35.9, adult: Secondary | ICD-10-CM | POA: Diagnosis not present

## 2017-09-29 DIAGNOSIS — Z7902 Long term (current) use of antithrombotics/antiplatelets: Secondary | ICD-10-CM | POA: Insufficient documentation

## 2017-09-29 DIAGNOSIS — E663 Overweight: Secondary | ICD-10-CM | POA: Diagnosis not present

## 2017-09-29 DIAGNOSIS — Z8673 Personal history of transient ischemic attack (TIA), and cerebral infarction without residual deficits: Secondary | ICD-10-CM | POA: Diagnosis not present

## 2017-09-29 DIAGNOSIS — Z955 Presence of coronary angioplasty implant and graft: Secondary | ICD-10-CM

## 2017-09-29 DIAGNOSIS — Z7982 Long term (current) use of aspirin: Secondary | ICD-10-CM | POA: Insufficient documentation

## 2017-09-29 DIAGNOSIS — E785 Hyperlipidemia, unspecified: Secondary | ICD-10-CM | POA: Diagnosis not present

## 2017-09-29 DIAGNOSIS — Z8249 Family history of ischemic heart disease and other diseases of the circulatory system: Secondary | ICD-10-CM | POA: Insufficient documentation

## 2017-09-29 DIAGNOSIS — I2583 Coronary atherosclerosis due to lipid rich plaque: Secondary | ICD-10-CM | POA: Insufficient documentation

## 2017-09-29 DIAGNOSIS — E1151 Type 2 diabetes mellitus with diabetic peripheral angiopathy without gangrene: Secondary | ICD-10-CM | POA: Insufficient documentation

## 2017-09-29 DIAGNOSIS — I2510 Atherosclerotic heart disease of native coronary artery without angina pectoris: Secondary | ICD-10-CM | POA: Insufficient documentation

## 2017-09-29 DIAGNOSIS — Z9861 Coronary angioplasty status: Secondary | ICD-10-CM

## 2017-09-29 DIAGNOSIS — Z7951 Long term (current) use of inhaled steroids: Secondary | ICD-10-CM | POA: Insufficient documentation

## 2017-09-29 HISTORY — PX: CORONARY STENT INTERVENTION: CATH118234

## 2017-09-29 HISTORY — PX: LEFT HEART CATH AND CORONARY ANGIOGRAPHY: CATH118249

## 2017-09-29 LAB — GLUCOSE, CAPILLARY
Glucose-Capillary: 158 mg/dL — ABNORMAL HIGH (ref 65–99)
Glucose-Capillary: 106 mg/dL — ABNORMAL HIGH (ref 65–99)

## 2017-09-29 LAB — POCT ACTIVATED CLOTTING TIME: Activated Clotting Time: 505 seconds

## 2017-09-29 SURGERY — LEFT HEART CATH AND CORONARY ANGIOGRAPHY
Anesthesia: LOCAL

## 2017-09-29 MED ORDER — MORPHINE SULFATE (PF) 10 MG/ML IV SOLN: 2.0000 mg | INTRAVENOUS | Status: DC | PRN

## 2017-09-29 MED ORDER — SODIUM CHLORIDE 0.9 % IV SOLN
INTRAVENOUS | Status: DC | PRN
  Administered 2017-09-29: 1.75 mg/kg/h via INTRAVENOUS

## 2017-09-29 MED ORDER — SODIUM CHLORIDE 0.9 % IV SOLN: 250.0000 mL | INTRAVENOUS | Status: DC | PRN

## 2017-09-29 MED ORDER — SODIUM CHLORIDE 0.9 % WEIGHT BASED INFUSION: 1.0000 mL/kg/h | INTRAVENOUS | Status: DC

## 2017-09-29 MED ORDER — HYDRALAZINE HCL 20 MG/ML IJ SOLN
INTRAMUSCULAR | Status: AC
  Filled 2017-09-29: qty 1

## 2017-09-29 MED ORDER — ASPIRIN 81 MG PO CHEW: 81.0000 mg | CHEWABLE_TABLET | Freq: Every day | ORAL | Status: DC

## 2017-09-29 MED ORDER — ISOSORBIDE MONONITRATE 15 MG HALF TABLET: 15.0000 mg | ORAL_TABLET | Freq: Every day | ORAL | Status: DC

## 2017-09-29 MED ORDER — IOPAMIDOL (ISOVUE-370) INJECTION 76%
INTRAVENOUS | Status: AC
  Filled 2017-09-29: qty 50

## 2017-09-29 MED ORDER — HEPARIN SODIUM (PORCINE) 1000 UNIT/ML IJ SOLN
INTRAMUSCULAR | Status: AC
  Filled 2017-09-29: qty 1

## 2017-09-29 MED ORDER — HEPARIN (PORCINE) IN NACL 2-0.9 UNIT/ML-% IJ SOLN
INTRAMUSCULAR | Status: AC
  Filled 2017-09-29: qty 1000

## 2017-09-29 MED ORDER — IOPAMIDOL (ISOVUE-370) INJECTION 76%
INTRAVENOUS | Status: AC
  Filled 2017-09-29: qty 100

## 2017-09-29 MED ORDER — CLOPIDOGREL BISULFATE 300 MG PO TABS
ORAL_TABLET | ORAL | Status: DC | PRN
  Administered 2017-09-29: 300 mg via ORAL

## 2017-09-29 MED ORDER — SODIUM CHLORIDE 0.9 % IV SOLN: INTRAVENOUS | Status: AC

## 2017-09-29 MED ORDER — CLOPIDOGREL BISULFATE 300 MG PO TABS
ORAL_TABLET | ORAL | Status: AC
  Filled 2017-09-29: qty 1

## 2017-09-29 MED ORDER — NITROGLYCERIN 1 MG/10 ML FOR IR/CATH LAB
INTRA_ARTERIAL | Status: AC
  Filled 2017-09-29: qty 10

## 2017-09-29 MED ORDER — LIDOCAINE HCL 2 % IJ SOLN
INTRAMUSCULAR | Status: DC | PRN
  Administered 2017-09-29: 2 mL

## 2017-09-29 MED ORDER — HYDRALAZINE HCL 20 MG/ML IJ SOLN: 5.0000 mg | INTRAMUSCULAR | Status: DC | PRN

## 2017-09-29 MED ORDER — NITROGLYCERIN 1 MG/10 ML FOR IR/CATH LAB
INTRA_ARTERIAL | Status: DC | PRN
  Administered 2017-09-29: 200 ug via INTRACORONARY

## 2017-09-29 MED ORDER — LABETALOL HCL 5 MG/ML IV SOLN
10.0000 mg | INTRAVENOUS | Status: DC | PRN
Start: 2017-09-29 — End: 2017-09-29

## 2017-09-29 MED ORDER — CLOPIDOGREL BISULFATE 75 MG PO TABS: 75.0000 mg | ORAL_TABLET | Freq: Every day | ORAL | Status: DC

## 2017-09-29 MED ORDER — LABETALOL HCL 5 MG/ML IV SOLN
INTRAVENOUS | Status: AC
  Filled 2017-09-29: qty 4

## 2017-09-29 MED ORDER — HYDRALAZINE HCL 25 MG PO TABS: 25.0000 mg | ORAL_TABLET | Freq: Every day | ORAL | Status: DC

## 2017-09-29 MED ORDER — SODIUM CHLORIDE 0.9% FLUSH: 3.0000 mL | Freq: Two times a day (BID) | INTRAVENOUS | Status: DC

## 2017-09-29 MED ORDER — ACETAMINOPHEN 325 MG PO TABS: 650.0000 mg | ORAL_TABLET | ORAL | Status: DC | PRN

## 2017-09-29 MED ORDER — SODIUM CHLORIDE 0.9% FLUSH: 3.0000 mL | INTRAVENOUS | Status: DC | PRN

## 2017-09-29 MED ORDER — CLOPIDOGREL BISULFATE 75 MG PO TABS: 75.0000 mg | ORAL_TABLET | Freq: Once | ORAL | Status: DC

## 2017-09-29 MED ORDER — PANTOPRAZOLE SODIUM 40 MG PO TBEC: 40.0000 mg | DELAYED_RELEASE_TABLET | Freq: Every day | ORAL | Status: DC

## 2017-09-29 MED ORDER — SODIUM CHLORIDE 0.9 % WEIGHT BASED INFUSION
3.0000 mL/kg/h | INTRAVENOUS | Status: AC
  Administered 2017-09-29: 3 mL/kg/h via INTRAVENOUS

## 2017-09-29 MED ORDER — LIDOCAINE HCL 2 % IJ SOLN
INTRAMUSCULAR | Status: AC
  Filled 2017-09-29: qty 10

## 2017-09-29 MED ORDER — VERAPAMIL HCL 2.5 MG/ML IV SOLN
INTRAVENOUS | Status: DC | PRN
  Administered 2017-09-29 (×2): 5 mL via INTRA_ARTERIAL

## 2017-09-29 MED ORDER — METOPROLOL SUCCINATE 12.5 MG HALF TABLET: 12.5000 mg | ORAL_TABLET | Freq: Every day | ORAL | Status: DC

## 2017-09-29 MED ORDER — ASPIRIN EC 81 MG PO TBEC: 81.0000 mg | DELAYED_RELEASE_TABLET | Freq: Every day | ORAL | 0 refills | Status: DC

## 2017-09-29 MED ORDER — LABETALOL HCL 5 MG/ML IV SOLN
10.0000 mg | Freq: Once | INTRAVENOUS | Status: AC
  Administered 2017-09-29: 10 mg via INTRAVENOUS

## 2017-09-29 MED ORDER — HEPARIN (PORCINE) IN NACL 2-0.9 UNIT/ML-% IJ SOLN
INTRAMUSCULAR | Status: AC | PRN
  Administered 2017-09-29: 1500 mL

## 2017-09-29 MED ORDER — VERAPAMIL HCL 2.5 MG/ML IV SOLN
INTRAVENOUS | Status: AC
  Filled 2017-09-29: qty 2

## 2017-09-29 MED ORDER — FUROSEMIDE 80 MG PO TABS: 80.0000 mg | ORAL_TABLET | Freq: Every day | ORAL | Status: DC

## 2017-09-29 MED ORDER — BIVALIRUDIN BOLUS VIA INFUSION - CUPID
INTRAVENOUS | Status: DC | PRN
  Administered 2017-09-29: 75.9 mg via INTRAVENOUS

## 2017-09-29 MED ORDER — NITROGLYCERIN 0.4 MG SL SUBL: 0.4000 mg | SUBLINGUAL_TABLET | SUBLINGUAL | Status: DC | PRN

## 2017-09-29 MED ORDER — HEPARIN SODIUM (PORCINE) 1000 UNIT/ML IJ SOLN
INTRAMUSCULAR | Status: DC | PRN
  Administered 2017-09-29: 5000 [IU] via INTRAVENOUS

## 2017-09-29 MED ORDER — CLOPIDOGREL BISULFATE 75 MG PO TABS: 75.0000 mg | ORAL_TABLET | Freq: Every day | ORAL | 0 refills | Status: DC

## 2017-09-29 MED ORDER — ASPIRIN 81 MG PO CHEW: 81.0000 mg | CHEWABLE_TABLET | ORAL | Status: DC

## 2017-09-29 MED ORDER — ONDANSETRON HCL 4 MG/2ML IJ SOLN: 4.0000 mg | Freq: Four times a day (QID) | INTRAMUSCULAR | Status: DC | PRN

## 2017-09-29 MED ORDER — HYDRALAZINE HCL 20 MG/ML IJ SOLN
INTRAMUSCULAR | Status: DC | PRN
  Administered 2017-09-29: 10 mg via INTRAVENOUS

## 2017-09-29 MED ORDER — ANGIOPLASTY BOOK
Freq: Once | Status: DC
  Filled 2017-09-29 (×2): qty 1

## 2017-09-29 MED ORDER — BIVALIRUDIN TRIFLUOROACETATE 250 MG IV SOLR
INTRAVENOUS | Status: AC
  Filled 2017-09-29: qty 250

## 2017-09-29 SURGICAL SUPPLY — 16 items
CATH INFINITI 5FR ANG PIGTAIL (CATHETERS) ×2 IMPLANT
CATH OPTITORQUE TIG 4.0 5F (CATHETERS) ×2 IMPLANT
CATH VISTA GUIDE 6FR XBLAD3.5 (CATHETERS) ×2 IMPLANT
DEVICE RAD COMP TR BAND LRG (VASCULAR PRODUCTS) ×2 IMPLANT
GLIDESHEATH SLEND A-KIT 6F 22G (SHEATH) ×2 IMPLANT
GUIDEWIRE INQWIRE 1.5J.035X260 (WIRE) ×1 IMPLANT
INQWIRE 1.5J .035X260CM (WIRE) ×2
KIT ENCORE 26 ADVANTAGE (KITS) ×2 IMPLANT
KIT HEART LEFT (KITS) ×2 IMPLANT
PACK CARDIAC CATHETERIZATION (CUSTOM PROCEDURE TRAY) ×2 IMPLANT
STENT SYNERGY DES 2.25X12 (Permanent Stent) ×2 IMPLANT
SYR MEDRAD MARK V 150ML (SYRINGE) ×2 IMPLANT
TRANSDUCER W/STOPCOCK (MISCELLANEOUS) ×2 IMPLANT
TUBING CIL FLEX 10 FLL-RA (TUBING) ×2 IMPLANT
WIRE ASAHI PROWATER 180CM (WIRE) ×2 IMPLANT
WIRE HI TORQ VERSACORE-J 145CM (WIRE) ×2 IMPLANT

## 2017-09-29 NOTE — Progress Notes (Signed)
CARDIAC REHAB PHASE I   Ed completed with pt and wife. He admits to needing tighter DM control. Discussed diet, Plavix, ex, NTG, and CRPII. Pt voiced understanding. I will send referral to [place]. [phone]  Harriet Masson CES, ACSM 09/29/2017 3:40 PM

## 2017-09-29 NOTE — Discharge Instructions (Signed)
NO METFORMIN/GLUCOPHAGE FOR 2 DAYS ° ° °Radial Site Care °Refer to this sheet in the next few weeks. These instructions provide you with information about caring for yourself after your procedure. Your health care provider may also give you more specific instructions. Your treatment has been planned according to current medical practices, but problems sometimes occur. Call your health care provider if you have any problems or questions after your procedure. °What can I expect after the procedure? °After your procedure, it is typical to have the following: °· Bruising at the radial site that usually fades within 1-2 weeks. °· Blood collecting in the tissue (hematoma) that may be painful to the touch. It should usually decrease in size and tenderness within 1-2 weeks. ° °Follow these instructions at home: °· Take medicines only as directed by your health care provider. °· You may shower 24-48 hours after the procedure or as directed by your health care provider. Remove the bandage (dressing) and gently wash the site with plain soap and water. Pat the area dry with a clean towel. Do not rub the site, because this may cause bleeding. °· Do not take baths, swim, or use a hot tub until your health care provider approves. °· Check your insertion site every day for redness, swelling, or drainage. °· Do not apply powder or lotion to the site. °· Do not flex or bend the affected arm for 24 hours or as directed by your health care provider. °· Do not push or pull heavy objects with the affected arm for 24 hours or as directed by your health care provider. °· Do not lift over 10 lb (4.5 kg) for 5 days after your procedure or as directed by your health care provider. °· Ask your health care provider when it is okay to: °? Return to work or school. °? Resume usual physical activities or sports. °? Resume sexual activity. °· Do not drive home if you are discharged the same day as the procedure. Have someone else drive you. °· You  may drive 24 hours after the procedure unless otherwise instructed by your health care provider. °· Do not operate machinery or power tools for 24 hours after the procedure. °· If your procedure was done as an outpatient procedure, which means that you went home the same day as your procedure, a responsible adult should be with you for the first 24 hours after you arrive home. °· Keep all follow-up visits as directed by your health care provider. This is important. °Contact a health care provider if: °· You have a fever. °· You have chills. °· You have increased bleeding from the radial site. Hold pressure on the site. °Get help right away if: °· You have unusual pain at the radial site. °· You have redness, warmth, or swelling at the radial site. °· You have drainage (other than a small amount of blood on the dressing) from the radial site. °· The radial site is bleeding, and the bleeding does not stop after 30 minutes of holding steady pressure on the site. °· Your arm or hand becomes pale, cool, tingly, or numb. °This information is not intended to replace advice given to you by your health care provider. Make sure you discuss any questions you have with your health care provider. °Document Released: 12/28/2010 Document Revised: 05/02/2016 Document Reviewed: 06/13/2014 °Elsevier Interactive Patient Education © 2018 Elsevier Inc. ° °

## 2017-09-29 NOTE — Progress Notes (Signed)
Per Denny Peon OK to discharge home if B/P less than 170 systolic

## 2017-09-29 NOTE — Progress Notes (Signed)
Dorthula Rue notified of possible ST elevation on cardiac monitor and 12 lead EKG done

## 2017-09-29 NOTE — Progress Notes (Signed)
Erin,PA in to see client and OK to discharge home

## 2017-09-29 NOTE — Progress Notes (Signed)
Erin,PA notified of client's b/p and labetalol given per Erin,PA

## 2017-09-29 NOTE — Discharge Summary (Signed)
Discharge Summary/Same Day PCI    Patient ID: Johnathan Keith,  MRN: [id number], DOB/AGE: 02-23-1961 56 y.o.  Admit date: 09/29/2017 Discharge date: 09/30/2017  Primary Care Provider: Charlotte Sanes Primary Cardiologist: Gwenlyn Found   Discharge Diagnoses    Active Problems:   CAD -S/P PCI-2016  Allergies No Known Allergies  Diagnostic Studies/Procedures    Cath: 09/29/17  Conclusion     Prox LAD to Mid LAD lesion, 0 %stenosed.  Mid LAD to Dist LAD lesion, 0 %stenosed.  Dist LAD lesion, 80 %stenosed.  Post intervention, there is a 5% residual stenosis.  A stent was successfully placed.  The left ventricular systolic function is normal.  LV end diastolic pressure is normal.  The left ventricular ejection fraction is 55-65% by visual estimate.   IMPRESSION: successful mid to distal LAD primary PCI and drug-eluting stenting using a Synergy 2.25 mm x 12 mm millimeter long drug-eluting stent deployed at 2.43 mm (16 atm). This may be because the patient's dyspnea since it was similar to his prior symptoms which were alleviated by proximal and mid LAD intervention to a half years ago. This was an uncomplicated low risk intervention. The patient is appropriate for same-day PCI discharge. I will see him back in the office next week. He will remain on dual antiplatelet therapy.  Quay Burow. MD, [facility] _____________   History of Present Illness     56 yo male with PMH of TIA, HTN, DM, PVD and CAD who presented to the office 09/24/17 with dyspnea. Had a cath 4/16 showing a p/mLAD stenosis successfully treated with PCI/DES. Underwent a normal myoview and echo in the month prior to this last office visit that was normal. At this last visit he reported chest tightness as a new symptoms along with the dyspnea. Reported his symptoms were similar to those he experienced with cath back in 2016. Given symptoms he was referred for outpatient cath.   Hospital Course     Underwent  outpatient cath with Dr. Gwenlyn Found showing 80% LAD distal to previously placed stent. Plan to continue DAPT with ASA/plavix. No complications noted post cath. He was seen by cardiac rehab while in short stay. Post cath radial site was stable. Instructions/restrictions given prior to discharge.    Johnathan Keith was seen by Dr. Gwenlyn Found and determined stable for discharge home. Follow up in the office has been arranged. Medications are listed below.  _____________  Discharge Vitals Blood pressure (!) 165/88, pulse 85, temperature 98.1 F (36.7 C), temperature source Oral, resp. rate 13, height _0  (1.676 m), weight 223 lb (101.2 kg), SpO2 97 %.  Filed Weights   09/29/17 1159  Weight: 223 lb (101.2 kg)    Labs & Radiologic Studies    CBC No results for input(s): WBC, NEUTROABS, HGB, HCT, MCV, PLT in the last 72 hours. Basic Metabolic Panel No results for input(s): NA, K, CL, CO2, GLUCOSE, BUN, CREATININE, CALCIUM, MG, PHOS in the last 72 hours. Liver Function Tests No results for input(s): AST, ALT, ALKPHOS, BILITOT, PROT, ALBUMIN in the last 72 hours. No results for input(s): LIPASE, AMYLASE in the last 72 hours. Cardiac Enzymes No results for input(s): CKTOTAL, CKMB, CKMBINDEX, TROPONINI in the last 72 hours. BNP Invalid input(s): POCBNP D-Dimer No results for input(s): DDIMER in the last 72 hours. Hemoglobin A1C No results for input(s): HGBA1C in the last 72 hours. Fasting Lipid Panel No results for input(s): CHOL, HDL, LDLCALC, TRIG, CHOLHDL, LDLDIRECT in the last 72 hours. Thyroid Function  Tests No results for input(s): TSH, T4TOTAL, T3FREE, THYROIDAB in the last 72 hours.  Invalid input(s): FREET3 _____________  Dg Chest 2 View  Result Date: 09/24/2017 CLINICAL DATA:  Preop for heart catheterization 09/29/2017. Shortness of breath. EXAM: CHEST  2 VIEW COMPARISON:  02/11/2017 chest CT FINDINGS: Normal heart size. Unremarkable mediastinal contours. Low volume chest which is  likely accentuated by kyphosis. Diffuse spondylosis. IMPRESSION: No evidence of active disease. Stable exam compared to February 2018 Electronically Signed   By: Monte Fantasia M.D.   On: 09/24/2017 15:24   Disposition   Pt is being discharged home today in good condition.  Follow-up Plans & Appointments    Follow-up Information    Lorretta Harp, MD Follow up on 10/07/2017.   Specialties:  Cardiology, Radiology Why:  at 1:45pm for your follow up appt.  Contact information: [address] [place] [address] [phone]          Discharge Instructions    Amb Referral to Cardiac Rehabilitation    Complete by:  As directed    To [place]   Diagnosis:   Coronary Stents PTCA        Discharge Medications     Medication List    STOP taking these medications   aspirin 81 MG chewable tablet Replaced by:  aspirin EC 81 MG tablet     TAKE these medications   aspirin EC 81 MG tablet Take 1 tablet (81 mg total) by mouth daily. Replaces:  aspirin 81 MG chewable tablet   clopidogrel 75 MG tablet Commonly known as:  PLAVIX Take 1 tablet (75 mg total) by mouth daily. What changed:  when to take this   cyclobenzaprine 5 MG tablet Commonly known as:  FLEXERIL Take 5 mg by mouth daily as needed for muscle spasms.   dicyclomine 10 MG capsule Commonly known as:  BENTYL Take 10 mg by mouth 4 (four) times daily.   diphenoxylate-atropine 2.5-0.025 MG tablet Commonly known as:  LOMOTIL Take 1 tablet by mouth 3 (three) times daily as needed for diarrhea or loose stools.   FARXIGA 10 MG Tabs tablet Generic drug:  dapagliflozin propanediol Take 10 mg by mouth daily.   fluticasone 50 MCG/ACT nasal spray Commonly known as:  FLONASE Place 1 spray into both nostrils 2 (two) times daily as needed for allergies.   furosemide 80 MG tablet Commonly known as:  LASIX Take 1 tablet alternating with 1/2 tablet every other day   gabapentin 100 MG  capsule Commonly known as:  NEURONTIN Take 1 capsule (100 mg total) by mouth 3 (three) times daily. What changed:  how much to take  when to take this   glimepiride 4 MG tablet Commonly known as:  AMARYL Take 4 mg by mouth 2 (two) times daily.   hydrALAZINE 25 MG tablet Commonly known as:  APRESOLINE TAKE 1 TABLET BY MOUTH AT BEDTIME   insulin NPH Human 100 UNIT/ML injection Commonly known as:  HUMULIN N,NOVOLIN N Inject into the skin as directed. Pt takes 55 units in the morning and 60 units at bedtime   isosorbide mononitrate 30 MG 24 hr tablet Commonly known as:  IMDUR TAKE ONE-HALF TABLET BY MOUTH ONCE DAILY   loperamide 2 MG capsule Commonly known as:  IMODIUM Take 2-4 mg by mouth as needed for diarrhea or loose stools.   metFORMIN 500 MG 24 hr tablet Commonly known as:  GLUCOPHAGE-XR Take 500 mg by mouth 2 (two) times daily.   metoprolol  succinate 25 MG 24 hr tablet Commonly known as:  TOPROL-XL Take 0.5 tablets (12.5 mg total) by mouth daily.   nitroGLYCERIN 0.4 MG SL tablet Commonly known as:  NITROSTAT Place 1 tablet (0.4 mg total) under the tongue every 5 (five) minutes as needed for chest pain.   pantoprazole 40 MG tablet Commonly known as:  PROTONIX Take 40 mg by mouth daily.   pravastatin 10 MG tablet Commonly known as:  PRAVACHOL Take 10 mg by mouth at bedtime.   ranitidine 300 MG tablet Commonly known as:  ZANTAC Take 300 mg by mouth at bedtime.   SYSTANE OP Place 1 drop into both eyes daily as needed (dry eyes).   tamsulosin 0.4 MG Caps capsule Commonly known as:  FLOMAX Take 1 capsule (0.4 mg total) by mouth daily.   Vitamin D 2000 units tablet Take 2,000 Units by mouth daily.        Outstanding Labs/Studies   N/a  Duration of Discharge Encounter   Greater than 30 minutes including physician time.  Signed, Reino Bellis NP-C 09/30/2017, 11:01 AM

## 2017-09-29 NOTE — H&P (View-Only) (Signed)
09/24/2017 Johnathan Keith Kincheloe   June 08, 1961  [id number]  Primary Physician Sistasis, Rosanne AshingJim, MD Primary Cardiologist: Runell GessJonathan J Jeanetta Alonzo MD Milagros LollFACP, FACC, [place], [place]  HPI:  Johnathan Keith Chavana is a 56 y.o. male mildly overweight married Caucasian male who I last saw in the office 08/27/17.  In addition to hypertension, his cardiovascular history is significant for prior TIA subsequent to carotid artery disease. He underwent right carotid endarterectomy performed by Dr. Imogene Burnhen on 09/26/2014. There is also question of remote myocardial infarction in the past. He recently underwent evaluation with a 2-D echocardiogram that was performed preoperatively which showed normal LV function as well as a Myoview stress test that had subtle inferior and lateral ischemia. At that time he had no SOB or chest pain and was cleared for surgery. His history is also notable for hyperlipidemia and diabetes. He was last seen by Dr. Allyson SabalBerry 10/26/2014. During that time, he complained of symptomatic hypotension with systolic blood pressures in the low 90s. Subsequently Dr. Allyson SabalBerry decided to decrease his lisinopril down from 40 mg to 20 mg daily and now down to 10 mg daily. He is taking a forth of 40 mg table. Since that time he has been seen by our office pharmacist Phillips HayKristin Alvstad for labile blood pressures. Though over all BP is improved. He is on most of his meds.   He has been on lasix 80 mg daily for a year for lower ext. Edema. His K+ has been elevated at times and currently he is off the kdur.His most recent lipid profile in our chart for/13/16 revealed total cholesterol 132, LDL 54 and HDL of 34.  In addition to lower ext edema he has DOE. No chest pain but walking in grocery store and to car he is very SOB. This has progressed since Nuc study. He has several risk factors: DM, hyperlipidemia, vascular disease and family hx CAD. His nuclear study was high risk for anterolateral and inferior ischemia. Based on this, and  in light of his increasing dyspnea on exertion as a potential anginal equivalent, he underwent diagnostic coronary arteriography by myself 03/23/15 revealing proximal and mid LAD stenoses which I intervened on with drug-eluting stents. The circumflex and RCA were free of significant disease as LV function was preserved. Since intervention the symptoms have markedly improved.   since I saw him a year ago he's remained clinically stable. He underwent nasal surgery 06/04/16. He saw Corine ShelterLuke Kilroy in the office 08/02/16 was normal as well. He continues to complain of some dyspnea on exertion however.  Since I saw him a year ago he has noticed some recent weight gain thought to be related to fluid accumulation as well as dyspnea with associated chest tightness over the last 6 months. His diuretics were recently adjusted.   Since I saw him in the office approximately a month ago I did obtain a 2-D echocardiogram on 09/05/17 as well as a Myoview stress test on 09/09/17 both of which were normal. He continues to have symptoms of dyspnea which he says are similar to his preintervention symptoms.   Current Meds  Medication Sig  . aspirin 81 MG chewable tablet Chew 1 tablet (81 mg total) by mouth daily.  . Cholecalciferol (VITAMIN D) 2000 UNITS tablet Take 2,000 Units by mouth daily.  . clopidogrel (PLAVIX) 75 MG tablet Take 1 tablet (75 mg total) by mouth daily with breakfast.  . cyclobenzaprine (FLEXERIL) 5 MG tablet Take 5 mg by mouth as directed.  . dapagliflozin propanediol (FARXIGA)  10 MG TABS tablet Take 10 mg by mouth daily.   Marland Kitchen dicyclomine (BENTYL) 10 MG capsule Take 10 mg by mouth 4 (four) times daily.   . diphenoxylate-atropine (LOMOTIL) 2.5-0.025 MG tablet Take 1 tablet by mouth daily as needed for diarrhea or loose stools.  . fluticasone (FLONASE) 50 MCG/ACT nasal spray Place 1 spray into both nostrils daily as needed.  . furosemide (LASIX) 80 MG tablet Take 1 tablet alternating with 1/2 tablet every  other day  . gabapentin (NEURONTIN) 100 MG capsule Take 1 capsule (100 mg total) by mouth 3 (three) times daily.  Marland Kitchen glimepiride (AMARYL) 4 MG tablet Take 4 mg by mouth 2 (two) times daily.  . hydrALAZINE (APRESOLINE) 25 MG tablet TAKE 1 TABLET BY MOUTH AT BEDTIME  . insulin NPH Human (HUMULIN N,NOVOLIN N) 100 UNIT/ML injection Inject into the skin as directed. Pt takes 55 units in the morning and 60 units at bedtime  . isosorbide mononitrate (IMDUR) 30 MG 24 hr tablet TAKE ONE-HALF TABLET BY MOUTH ONCE DAILY  . loperamide (IMODIUM) 2 MG capsule Take by mouth daily as needed for diarrhea or loose stools.  . metFORMIN (GLUCOPHAGE-XR) 500 MG 24 hr tablet Take 500 mg by mouth 2 (two) times daily.  . metoprolol succinate (TOPROL-XL) 25 MG 24 hr tablet Take 0.5 tablets (12.5 mg total) by mouth daily.  . nitroGLYCERIN (NITROSTAT) 0.4 MG SL tablet Place 1 tablet (0.4 mg total) under the tongue every 5 (five) minutes as needed for chest pain.  . pantoprazole (PROTONIX) 40 MG tablet Take 40 mg by mouth daily.  Bertram Gala Glycol-Propyl Glycol (SYSTANE OP) Place 1 drop into both eyes 2 (two) times daily.  . pravastatin (PRAVACHOL) 10 MG tablet Take 10 mg by mouth daily.  . ranitidine (ZANTAC) 300 MG tablet Take 1 tablet by mouth at bedtime.  . tamsulosin (FLOMAX) 0.4 MG CAPS capsule Take 1 capsule (0.4 mg total) by mouth daily.     No Known Allergies  Social History   Social History  . Marital status: Married    Spouse name: N/A  . Number of children: 5  . Years of education: 12th   Occupational History  . Disabled    Social History Main Topics  . Smoking status: Never Smoker  . Smokeless tobacco: Never Used  . Alcohol use No  . Drug use: No  . Sexual activity: Not Currently    Partners: Female   Other Topics Concern  . Not on file   Social History Narrative   Patient is married with 5 children.    Patient is right handed.   Patient has hs education.   Patient drinks 4 12 oz can  sodas daily.     Review of Systems: General: negative for chills, fever, night sweats or weight changes.  Cardiovascular: negative for chest pain, dyspnea on exertion, edema, orthopnea, palpitations, paroxysmal nocturnal dyspnea or shortness of breath Dermatological: negative for rash Respiratory: negative for cough or wheezing Urologic: negative for hematuria Abdominal: negative for nausea, vomiting, diarrhea, bright red blood per rectum, melena, or hematemesis Neurologic: negative for visual changes, syncope, or dizziness All other systems reviewed and are otherwise negative except as noted above.    Blood pressure (!) 142/81, pulse 88, height 5\' 6"  (1.676 m), weight 223 lb (101.2 kg).  General appearance: alert and no distress Neck: no adenopathy, no carotid bruit, no JVD, supple, symmetrical, trachea midline and thyroid not enlarged, symmetric, no tenderness/mass/nodules Lungs: clear to auscultation bilaterally Heart: regular  rate and rhythm, S1, S2 normal, no murmur, click, rub or gallop Extremities: extremities normal, atraumatic, no cyanosis or edema  EKG not performed today  ASSESSMENT AND PLAN:   Dyspnea on exertion Mr. Alamillo returns for follow-up of his diagnostic test. 2-D echocardiogram and Myoview stress test were completely normal. He continues to have symptoms of dyspnea. These are symptoms similar to this preintervention symptoms 2-1/2 years ago. Based on this, I am going to proceed with outpatient diagnostic coronary angiography to define his anatomy and rule out an ischemic etiology.The patient understands that risks included but are not limited to stroke (1 in 1000), death (1 in 1000), kidney failure [usually temporary] (1 in 500), bleeding (1 in 200), allergic reaction [possibly serious] (1 in 200). The patient understands and agrees to proceed       Runell Gess MD [facility], [facility] 09/24/2017 12:08 PM

## 2017-09-29 NOTE — Interval H&P Note (Signed)
Cath Lab Visit (complete for each Cath Lab visit)  Clinical Evaluation Leading to the Procedure:   ACS: No.  Non-ACS:    Anginal Classification: CCS II  Anti-ischemic medical therapy: Minimal Therapy (1 class of medications)  Non-Invasive Test Results: Low-risk stress test findings: cardiac mortality <1%/year  Prior CABG: No previous CABG      History and Physical Interval Note:  09/29/2017 1:44 PM  Johnathan Keith  has presented today for surgery, with the diagnosis of sob  The various methods of treatment have been discussed with the patient and family. After consideration of risks, benefits and other options for treatment, the patient has consented to  Procedure(s): LEFT HEART CATH AND CORONARY ANGIOGRAPHY (N/A) as a surgical intervention .  The patient's history has been reviewed, patient examined, no change in status, stable for surgery.  I have reviewed the patient's chart and labs.  Questions were answered to the patient's satisfaction.     Nanetta Batty

## 2017-09-30 ENCOUNTER — Encounter (HOSPITAL_COMMUNITY): Payer: Self-pay | Admitting: Cardiovascular Disease

## 2017-09-30 ENCOUNTER — Telehealth: Payer: Self-pay | Admitting: Cardiovascular Disease

## 2017-09-30 MED ORDER — NITROGLYCERIN 0.4 MG SL SUBL: 0.4000 mg | SUBLINGUAL_TABLET | SUBLINGUAL | 11 refills | Status: DC | PRN

## 2017-09-30 NOTE — Telephone Encounter (Signed)
New message     *STAT* If patient is at the pharmacy, call can be transferred to refill team.   1. Which medications need to be refilled? (please list name of each medication and dose if known) nitroGLYCERIN (NITROSTAT) 0.4 MG SL tablet  2. Which pharmacy/location (including street and city if local pharmacy) is medication to be sent to? [facility] in [place], [place]  3. Do they need a 30 day or 90 day supply? 30

## 2017-10-07 ENCOUNTER — Ambulatory Visit (INDEPENDENT_AMBULATORY_CARE_PROVIDER_SITE_OTHER): Payer: Medicare HMO | Admitting: Cardiovascular Disease

## 2017-10-07 ENCOUNTER — Encounter: Payer: Self-pay | Admitting: Cardiovascular Disease

## 2017-10-07 VITALS — BP 121/68 | HR 85 | Ht 66.0 in | Wt 227.6 lb

## 2017-10-07 DIAGNOSIS — E7849 Other hyperlipidemia: Secondary | ICD-10-CM

## 2017-10-07 DIAGNOSIS — Z9861 Coronary angioplasty status: Secondary | ICD-10-CM

## 2017-10-07 DIAGNOSIS — I2510 Atherosclerotic heart disease of native coronary artery without angina pectoris: Secondary | ICD-10-CM | POA: Diagnosis not present

## 2017-10-07 MED ORDER — PRAVASTATIN SODIUM 20 MG PO TABS: 20.0000 mg | ORAL_TABLET | Freq: Every day | ORAL | 6 refills | Status: DC

## 2017-10-07 NOTE — Addendum Note (Signed)
Addended by: Chana Bode on: 10/07/2017 04:24 PM   Modules accepted: Orders

## 2017-10-07 NOTE — Assessment & Plan Note (Signed)
Johnathan Keith returns today after his recent outpatient cardiac catheterization which I performed via the right radial approach on 09/29/17. He had ongoing symptoms despite a negative Myoview a normal 2-D echo. This revealed normal LV function with normal LVEDP. His stents were patent although he did have an 80% stenosis of his mid LAD. Just beyond his stent which I restented using a 2.25 x 12 mm long synergy drug-eluting stent postdilated to 2.43 mm. The symptoms of chest pain have resolved although he remains dyspneic.

## 2017-10-07 NOTE — Progress Notes (Signed)
10/07/2017 Johnathan Keith   1961-08-25  [id number]  Primary Physician Sistasis, Rosanne Ashing, MD Primary Cardiologist: Runell Gess MD Milagros Loll, [place], [place]  HPI:  Johnathan Keith is a 56 y.o. male mildly overweight married Caucasian male who I last saw in the office 09/24/17.  In addition to hypertension, his cardiovascular history is significant for prior TIA subsequent to carotid artery disease. He underwent right carotid endarterectomy performed by Dr. [name] on 09/26/2014. There is also question of remote myocardial infarction in the past. He recently underwent evaluation with a 2-D echocardiogram that was performed preoperatively which showed normal LV function as well as a Myoview stress test that had subtle inferior and lateral ischemia. At that time he had no SOB or chest pain and was cleared for surgery. His history is also notable for hyperlipidemia and diabetes. He was last seen by Dr. Allyson Sabal 10/26/2014. During that time, he complained of symptomatic hypotension with systolic blood pressures in the low 90s. Subsequently Dr. Allyson Sabal decided to decrease his lisinopril down from 40 mg to 20 mg daily and now down to 10 mg daily. He is taking a forth of 40 mg table. Since that time he has been seen by our office pharmacist Phillips Hay for labile blood pressures. Though over all BP is improved. He is on most of his meds.   He has been on lasix 80 mg daily for a year for lower ext. Edema. His K+ has been elevated at times and currently he is off the kdur.His most recent lipid profile in our chart for/13/16 revealed total cholesterol 132, LDL 54 and HDL of 34.  In addition to lower ext edema he has DOE. No chest pain but walking in grocery store and to car he is very SOB. This has progressed since Nuc study. He has several risk factors: DM, hyperlipidemia, vascular disease and family hx CAD. His nuclear study was high risk for anterolateral and inferior ischemia. Based on this, and  in light of his increasing dyspnea on exertion as a potential anginal equivalent, he underwent diagnostic coronary arteriography by myself 03/23/15 revealing proximal and mid LAD stenoses which I intervened on with drug-eluting stents. The circumflex and RCA were free of significant disease as LV function was preserved. Since intervention the symptoms have markedly improved.   since I saw him a year ago he's remained clinically stable. He underwent nasal surgery 06/04/16. He saw Corine Shelter in the office 08/02/16 was normal as well. He continues to complain of some dyspnea on exertion however.  Since I saw him a year ago he has noticed some recent weight gain thought to be related to fluid accumulation as well as dyspnea with associated chest tightness over the last 6 months. His diuretics were recently adjusted.   Since I saw him in the office approximately a month ago I did obtain a 2-D echocardiogram on 09/05/17 as well as a Myoview stress test on 09/09/17 both of which were normal. He continued to have symptoms of dyspnea which he says are similar to his preintervention symptoms. Because of this I elected to perform outpatient diagnostic cardiac catheterization on 09/29/17 via right radial approach revealing normal LV function with a normal LVEDP, patent stents with 80% mid LAD lesion just beyond the distal stent which I restented using a 2.25 x 12 mm long synergy drug-eluting stent postdilated to 2.43 mm. The symptoms of chest pain have resolved and remained dyspneic.   Current Meds  Medication Sig  . aspirin  EC 81 MG tablet Take 1 tablet (81 mg total) by mouth daily.  . Cholecalciferol (VITAMIN D) 2000 UNITS tablet Take 2,000 Units by mouth daily.  . clopidogrel (PLAVIX) 75 MG tablet Take 1 tablet (75 mg total) by mouth daily.  . cyclobenzaprine (FLEXERIL) 5 MG tablet Take 5 mg by mouth daily as needed for muscle spasms.   . dapagliflozin propanediol (FARXIGA) 10 MG TABS tablet Take 10 mg by mouth  daily.   Marland Kitchen. dicyclomine (BENTYL) 10 MG capsule Take 10 mg by mouth 4 (four) times daily.   . diphenoxylate-atropine (LOMOTIL) 2.5-0.025 MG tablet Take 1 tablet by mouth 3 (three) times daily as needed for diarrhea or loose stools.   . fluticasone (FLONASE) 50 MCG/ACT nasal spray Place 1 spray into both nostrils 2 (two) times daily as needed for allergies.   . furosemide (LASIX) 80 MG tablet Take 1 tablet alternating with 1/2 tablet every other day  . gabapentin (NEURONTIN) 100 MG capsule Take 1 capsule (100 mg total) by mouth 3 (three) times daily. (Patient taking differently: Take 300 mg by mouth at bedtime. )  . glimepiride (AMARYL) 4 MG tablet Take 4 mg by mouth 2 (two) times daily.  . hydrALAZINE (APRESOLINE) 25 MG tablet TAKE 1 TABLET BY MOUTH AT BEDTIME  . insulin NPH Human (HUMULIN N,NOVOLIN N) 100 UNIT/ML injection Inject into the skin as directed. Pt takes 55 units in the morning and 60 units at bedtime  . isosorbide mononitrate (IMDUR) 30 MG 24 hr tablet TAKE ONE-HALF TABLET BY MOUTH ONCE DAILY  . loperamide (IMODIUM) 2 MG capsule Take 2-4 mg by mouth as needed for diarrhea or loose stools.   . metFORMIN (GLUCOPHAGE-XR) 500 MG 24 hr tablet Take 500 mg by mouth 2 (two) times daily.  . metoprolol succinate (TOPROL-XL) 25 MG 24 hr tablet Take 0.5 tablets (12.5 mg total) by mouth daily.  . nitroGLYCERIN (NITROSTAT) 0.4 MG SL tablet Place 1 tablet (0.4 mg total) under the tongue every 5 (five) minutes as needed for chest pain.  . pantoprazole (PROTONIX) 40 MG tablet Take 40 mg by mouth daily.  Bertram Gala. Polyethyl Glycol-Propyl Glycol (SYSTANE OP) Place 1 drop into both eyes daily as needed (dry eyes).   . pravastatin (PRAVACHOL) 20 MG tablet Take 1 tablet (20 mg total) by mouth at bedtime.  . ranitidine (ZANTAC) 300 MG tablet Take 300 mg by mouth at bedtime.   . tamsulosin (FLOMAX) 0.4 MG CAPS capsule Take 1 capsule (0.4 mg total) by mouth daily.  . [DISCONTINUED] pravastatin (PRAVACHOL) 10 MG tablet  Take 10 mg by mouth at bedtime.      No Known Allergies  Social History   Social History  . Marital status: Married    Spouse name: N/A  . Number of children: 5  . Years of education: 12th   Occupational History  . Disabled    Social History Main Topics  . Smoking status: Never Smoker  . Smokeless tobacco: Never Used  . Alcohol use No  . Drug use: No  . Sexual activity: Not Currently    Partners: Female   Other Topics Concern  . Not on file   Social History Narrative   Patient is married with 5 children.    Patient is right handed.   Patient has hs education.   Patient drinks 4 12 oz can sodas daily.     Review of Systems: General: negative for chills, fever, night sweats or weight changes.  Cardiovascular: negative for chest pain,  dyspnea on exertion, edema, orthopnea, palpitations, paroxysmal nocturnal dyspnea or shortness of breath Dermatological: negative for rash Respiratory: negative for cough or wheezing Urologic: negative for hematuria Abdominal: negative for nausea, vomiting, diarrhea, bright red blood per rectum, melena, or hematemesis Neurologic: negative for visual changes, syncope, or dizziness All other systems reviewed and are otherwise negative except as noted above.    Blood pressure 121/68, pulse 85, height 5\' 6"  (1.676 m), weight 227 lb 9.6 oz (103.2 kg).  General appearance: alert and no distress Neck: no adenopathy, no carotid bruit, no JVD, supple, symmetrical, trachea midline and thyroid not enlarged, symmetric, no tenderness/mass/nodules Lungs: clear to auscultation bilaterally Heart: regular rate and rhythm, S1, S2 normal, no murmur, click, rub or gallop Extremities: extremities normal, atraumatic, no cyanosis or edema Pulses: 2+ and symmetric Skin: Skin color, texture, turgor normal. No rashes or lesions Neurologic: Alert and oriented X 3, normal strength and tone. Normal symmetric reflexes. Normal coordination and gait  EKG sinus  rhythm at 85 without ST or T-wave changes. Personally reviewed this EKG  ASSESSMENT AND PLAN:   CAD -S/P PCI-2016 Johnathan Keith returns today after his recent outpatient cardiac catheterization which I performed via the right radial approach on 09/29/17. He had ongoing symptoms despite a negative Myoview a normal 2-D echo. This revealed normal LV function with normal LVEDP. His stents were patent although he did have an 80% stenosis of his mid LAD. Just beyond his stent which I restented using a 2.25 x 12 mm long synergy drug-eluting stent postdilated to 2.43 mm. The symptoms of chest pain have resolved although he remains dyspneic.  Hyperlipidemia  History of hyperlipidemia  on pravastatin 10 mg a day with recent lipid profile performed 08/27/17 revealing LDL of 88 and HDL 34. He has not echo. His LDL on 03/22/15 was 54. I am going to increase his pravastatin from 10-20 mg a day and will recheck a lipid and liver profile in 3 months.      Runell Gess MD FACP,FACC,FAHA, [facility] 10/07/2017 1:47 PM

## 2017-10-07 NOTE — Patient Instructions (Signed)
Medication Instructions: Your physician recommends that you continue on your current medications as directed. Please refer to the Current Medication list given to you today.  Increase Pravastatin to 20 mg daily.   Labwork: Your physician recommends that you return for a FASTING lipid profile and hepatic function panel in 2-3 months. Do not eat or drink anything after midnight. You may drink small sips of water to take any morning/evening medications.   Follow-Up: We request that you follow-up in: 6 months with an extender and in 12 months with Dr San Morelle will receive a reminder letter in the mail two months in advance. If you don't receive a letter, please call our office to schedule the follow-up appointment.  If you need a refill on your cardiac medications before your next appointment, please call your pharmacy.

## 2017-10-07 NOTE — Assessment & Plan Note (Signed)
History of hyperlipidemia  on pravastatin 10 mg a day with recent lipid profile performed 08/27/17 revealing LDL of 88 and HDL 34. He has not echo. His LDL on 03/22/15 was 54. I am going to increase his pravastatin from 10-20 mg a day and will recheck a lipid and liver profile in 3 months.

## 2017-10-10 ENCOUNTER — Other Ambulatory Visit: Payer: Self-pay

## 2017-10-10 DIAGNOSIS — I6529 Occlusion and stenosis of unspecified carotid artery: Secondary | ICD-10-CM

## 2017-10-13 ENCOUNTER — Other Ambulatory Visit: Payer: Self-pay | Admitting: Cardiovascular Disease

## 2017-10-17 ENCOUNTER — Ambulatory Visit: Payer: Medicare HMO | Admitting: Cardiovascular Disease

## 2017-11-07 ENCOUNTER — Ambulatory Visit (INDEPENDENT_AMBULATORY_CARE_PROVIDER_SITE_OTHER): Payer: Medicare HMO | Admitting: Family

## 2017-11-07 ENCOUNTER — Encounter: Payer: Self-pay | Admitting: Family

## 2017-11-07 ENCOUNTER — Ambulatory Visit (HOSPITAL_COMMUNITY)
Admission: RE | Admit: 2017-11-07 | Discharge: 2017-11-07 | Disposition: A | Discharge status: Home / Self Care | Payer: Medicare HMO | Source: Ambulatory Visit | Attending: Family | Admitting: Family

## 2017-11-07 VITALS — BP 168/92 | HR 79 | Temp 97.0°F | Resp 18 | Wt 232.2 lb

## 2017-11-07 DIAGNOSIS — I6522 Occlusion and stenosis of left carotid artery: Secondary | ICD-10-CM | POA: Insufficient documentation

## 2017-11-07 DIAGNOSIS — Z9889 Other specified postprocedural states: Secondary | ICD-10-CM

## 2017-11-07 DIAGNOSIS — I6521 Occlusion and stenosis of right carotid artery: Secondary | ICD-10-CM

## 2017-11-07 DIAGNOSIS — I6529 Occlusion and stenosis of unspecified carotid artery: Secondary | ICD-10-CM

## 2017-11-07 LAB — VAS US CAROTID
LEFT ECA DIAS: -37 cm/s
LEFT VERTEBRAL DIAS: -14 cm/s
Left CCA dist dias: 32 cm/s
Left CCA dist sys: 162 cm/s
Left CCA prox dias: 25 cm/s
Left CCA prox sys: 117 cm/s
Left ICA dist dias: -31 cm/s
Left ICA dist sys: -89 cm/s
Left ICA prox dias: 25 cm/s
Left ICA prox sys: 108 cm/s
RIGHT CCA MID DIAS: 18 cm/s
RIGHT ECA DIAS: -12 cm/s
RIGHT VERTEBRAL DIAS: -10 cm/s
Right CCA prox dias: -14 cm/s
Right CCA prox sys: -42 cm/s
Right cca dist sys: -86 cm/s

## 2017-11-07 NOTE — Patient Instructions (Signed)

## 2017-11-07 NOTE — Progress Notes (Signed)
Chief Complaint: Follow up Extracranial Carotid Artery Stenosis   History of Present Illness  Johnathan Keith is a 56 y.o. male patient of Dr. [name] who is s/p R CEA on 09/26/14. Patient has known history of TIA or stroke symptom in March 2014. The patient has never had amaurosis fugax or monocular blindness. The patient has had left sided weakness. The patient has never had receptive or expressive aphasia.   Cardiac cath with 2 stents placed in April 2016. Had another cardiac stent placed in October 2018, was having chest pressure and dyspnea; no longer has chest pressure, states he still has dyspnea.   He had surgery for deviated septum in June 2017 due to oxygen desaturation when he nose breaths.  He needs bilateral carpal tunnel repair, left worse than right. He has occassiona diarrhea since 2014, has been evaluated by GI.   His knees give way occasionally, no pain associated with this.  He reports 3 episodes of right leg feeling "dead" in the last year, usually when sitting/driving, and he has to get up and walk.  He has known c-spine and lumbar spine issues.   He had an evaluation for OSA, wife states he stops breathing several times/night.  He has had intermittent pulsatile tinnitus in his left ear for about 2 years, states he will discuss this with the ENT specialist he is already seeing re his deviated septum. He states his cardiologist is adjusting his htn meds, he has had episodes of hypotension when he walks far enough or traverses stairs.  He takes Lasix daily, states he has not been diagnosed with CHF, but seems like he has this.   Pt Diabetic: yes, working with an endocrinologist to improve his glycemic control, Dr. Allena Katz with [facility], 9.1 A1C on 07/11/16 (review of records). He states his most recent A1C was 7.4 Pt smoker: non-smoker, second hand exposure from his father as a child  Pt meds include: Statin : yes ASA: yes Other anticoagulants/antiplatelets:  Plavix   Past Medical History:  Diagnosis Date  . Chronic neck and back pain   . Coronary artery disease    drug-eluting stents placed in proximal and mid LAD  . Deviated septum   . Diabetic retinopathy (HCC)   . GERD (gastroesophageal reflux disease)    "occasionally" (03/23/2015)  . Headache    "more than 2/wk" (03/23/2015)  . Heart attack (HCC)    "in 1997 was told I had signs of a small heart attack that I didn't know I'd had"  . High cholesterol   . History of gout    "when I was younger"  . Hypertension   . Kidney stones   . Migraine    "had my 1st and only in 12/2014" (03/23/2015)  . Peripheral vascular disease (HCC)    carotid artery disease  . PONV (postoperative nausea and vomiting)    "was told I was confused and combative in recovery from the narcotics w/my carotid OR"  . Sleep apnea    "severe; couldn't afford mask" (03/23/2015)  . Stroke [facility]) 2014   "on walker for 2 months; left side is weaker and numb since" (03/23/2015)  . Type II diabetes mellitus (HCC)     Social History Social History   Tobacco Use  . Smoking status: Never Smoker  . Smokeless tobacco: Never Used  Substance Use Topics  . Alcohol use: No    Alcohol/week: 0.0 oz  . Drug use: No    Family History Family History  Problem  Relation Age of Onset  . Heart disease Mother   . Diabetes Mother   . Hyperlipidemia Mother   . Hypertension Mother   . Cancer Father        Lung Cancer   . Heart disease Father   . Heart attack Father   . Gout Brother   . Asthma Brother   . Hyperlipidemia Brother   . Hypertension Brother   . Diabetes Brother   . Cancer Brother        colon  . Stroke Paternal Grandfather   . Colon cancer Sister   . Hyperlipidemia Sister   . Hypertension Sister   . Gout Maternal Uncle   . Diabetes Maternal Grandfather   . Diabetes Daughter     Surgical History Past Surgical History:  Procedure Laterality Date  . CIRCUMCISION  1981  . CORONARY ANGIOPLASTY WITH STENT  PLACEMENT  03/23/2015   "2"  . CORONARY STENT INTERVENTION N/A 09/29/2017   Procedure: CORONARY STENT INTERVENTION;  Surgeon: Runell Gess, MD;  Location: [facility] INVASIVE CV LAB;  Service: Cardiovascular;  Laterality: N/A;  . ENDARTERECTOMY Right 09/26/2014   Procedure: RIGHT CAROTID ENDARTERECTOMY WITH PATCH ANGIOPLASTY;  Surgeon: Fransisco Hertz, MD;  Location[facility];  Service: Vascular;  Laterality: Right;  . EYE SURGERY Right May 2016   Cataract  . EYE SURGERY Left July 2016   Cataract  . HYDROCELE EXCISION  ~ 2008  . LEFT HEART CATH AND CORONARY ANGIOGRAPHY N/A 09/29/2017   Procedure: LEFT HEART CATH AND CORONARY ANGIOGRAPHY;  Surgeon: Runell Gess, MD;  Location: [facility] INVASIVE CV LAB;  Service: Cardiovascular;  Laterality: N/A;  . LEFT HEART CATHETERIZATION WITH CORONARY ANGIOGRAM N/A 03/23/2015   Procedure: LEFT HEART CATHETERIZATION WITH CORONARY ANGIOGRAM;  Surgeon: Runell Gess, MD;  Location: [facility] CATH LAB;  Service: Cardiovascular;  Laterality: N/A;  . REFRACTIVE SURGERY Bilateral 08/2014-09/2014   Diabetic retinopathy     No Known Allergies  Current Outpatient Medications  Medication Sig Dispense Refill  . aspirin EC 81 MG tablet Take 1 tablet (81 mg total) by mouth daily. 30 tablet 0  . Cholecalciferol (VITAMIN D) 2000 UNITS tablet Take 2,000 Units by mouth daily.    . clopidogrel (PLAVIX) 75 MG tablet Take 1 tablet (75 mg total) by mouth daily. 30 tablet 0  . cyclobenzaprine (FLEXERIL) 5 MG tablet Take 5 mg by mouth daily as needed for muscle spasms.     . dapagliflozin propanediol (FARXIGA) 10 MG TABS tablet Take 10 mg by mouth daily.     Marland Kitchen dicyclomine (BENTYL) 10 MG capsule Take 10 mg by mouth 4 (four) times daily.     . diphenoxylate-atropine (LOMOTIL) 2.5-0.025 MG tablet Take 1 tablet by mouth 3 (three) times daily as needed for diarrhea or loose stools.     . fluticasone (FLONASE) 50 MCG/ACT nasal spray Place 1 spray into both nostrils 2 (two) times daily as needed  for allergies.     . furosemide (LASIX) 80 MG tablet Take 1 tablet alternating with 1/2 tablet every other day 30 tablet 12  . gabapentin (NEURONTIN) 100 MG capsule Take 1 capsule (100 mg total) by mouth 3 (three) times daily. (Patient taking differently: Take 300 mg by mouth at bedtime. ) 90 capsule 0  . glimepiride (AMARYL) 4 MG tablet Take 4 mg by mouth 2 (two) times daily.    . hydrALAZINE (APRESOLINE) 25 MG tablet TAKE 1 TABLET BY MOUTH AT BEDTIME 30 tablet 6  . insulin NPH  Human (HUMULIN N,NOVOLIN N) 100 UNIT/ML injection Inject into the skin as directed. Pt takes 55 units in the morning and 65 units at bedtime    . isosorbide mononitrate (IMDUR) 30 MG 24 hr tablet TAKE ONE-HALF TABLET BY MOUTH ONCE DAILY 45 tablet 3  . loperamide (IMODIUM) 2 MG capsule Take 2-4 mg by mouth as needed for diarrhea or loose stools.     . metFORMIN (GLUCOPHAGE-XR) 500 MG 24 hr tablet Take 500 mg by mouth 2 (two) times daily.    . metoprolol succinate (TOPROL-XL) 25 MG 24 hr tablet TAKE 1/2 (ONE-HALF) TABLET BY MOUTH ONCE DAILY 45 tablet 1  . nitroGLYCERIN (NITROSTAT) 0.4 MG SL tablet Place 1 tablet (0.4 mg total) under the tongue every 5 (five) minutes as needed for chest pain. 25 tablet 11  . pantoprazole (PROTONIX) 40 MG tablet Take 40 mg by mouth daily.    Bertram Gala. Polyethyl Glycol-Propyl Glycol (SYSTANE OP) Place 1 drop into both eyes daily as needed (dry eyes).     . pravastatin (PRAVACHOL) 20 MG tablet Take 1 tablet (20 mg total) by mouth at bedtime. 30 tablet 6  . ranitidine (ZANTAC) 300 MG tablet Take 300 mg by mouth at bedtime.     . tamsulosin (FLOMAX) 0.4 MG CAPS capsule Take 1 capsule (0.4 mg total) by mouth daily. 30 capsule 1   No current facility-administered medications for this visit.     Review of Systems : See HPI for pertinent positives and negatives.  Physical Examination  Vitals:   11/07/17 1135 11/07/17 1137  BP: (!) 161/90 (!) 168/92  Pulse: 79   Resp: 18   Temp: (!) 97 F (36.1 C)    TempSrc: Oral   SpO2: 96%   Weight: 232 lb 3.2 oz (105.3 kg)    Body mass index is 37.48 kg/m.  General: WDWN obese male in NAD GAIT:using cane Eyes: PERRLA Pulmonary: Respirations are non-labored, CTAB, no rales, no rhonchi, or wheezing.  Cardiac: regular rhythm, no detected murmur.  VASCULAR EXAM Carotid Bruits Right Left   Negative Negative   Abdominal aortic pulse is not palpable. Radial pulses are 1+ palpable and equal.   LE Pulses Right Left  POPLITEAL not palpable not palpable  POSTERIOR TIBIAL 2+ palpable 2+ palpable  DORSALIS PEDIS ANTERIOR TIBIAL 2+ palpable 2+ palpable    Gastrointestinal: soft, nontender, BS WNL, no r/g, no palpable masses.  Musculoskeletal: no muscle atrophy/wasting. M/S 5/5 throughout except 4/5 in left leg, extremities without ischemic changes.  Skin: No rash, no cellulitis, no ulcers.   Neurologic: A&O X 3; appropriate affect, speech is normal, CN 2-12 intact, pain and light touch intact in extremities, Motor exam as listed above.    Assessment: Johnathan Keith is a 56 y.o. male s/p right carotid endarterectomy on 09/26/2014. He had a right brain stroke in 2014, has moderate left hemiparesis; no subsequent stroke or TIA.   He has 2+ bilateral pedal pulses despite 1-2+ non pitting edema in both ankles.   He has known c-spine and lumbar spine issues evaluated previously by neurology, Dr. Roda ShuttersXu.  Will refer to Dr. Maeola HarmanJoseph Stern, neurosurgeon, for worsening symptoms of right leg giving way, right hip pain, lack of sensation in right foot only.    DATA  Carotid Duplex (11/07/17): Patent right carotid endarterectomy with less than 40% internal carotid artery stenosis, and less than 40% left internal carotid artery stenosis. Left ECA stenosis. Bilateral vertebral artery flow is antegrade. Bilateral subclavian artery waveforms are normal.  No  significant change since examson  10-27-15 and 10-25-16.   Plan:  Follow-up in 1 year with Carotid Duplex scan.  I discussed in depth with the patient the nature of atherosclerosis, and emphasized the importance of maximal medical management including strict control of blood pressure, blood glucose, and lipid levels, obtaining regular exercise, and continued cessation of smoking.  The patient is aware that without maximal medical management the underlying atherosclerotic disease process will progress, limiting the benefit of any interventions. The patient was given information about stroke prevention and what symptoms should prompt the patient to seek immediate medical care. Thank you for allowing [place] to participate in this patient's care.  Charisse March, RN, MSN, FNP-C [facility] Office: [phone]  Clinic Physician: Randie Heinz  11/07/17 11:40 AM

## 2017-12-01 NOTE — Addendum Note (Signed)
Addended by: Burton Apley A on: 12/01/2017 11:01 AM   Modules accepted: Orders

## 2018-02-11 ENCOUNTER — Encounter: Payer: Self-pay | Admitting: Gastroenterology

## 2018-03-16 DIAGNOSIS — I639 Cerebral infarction, unspecified: Secondary | ICD-10-CM | POA: Diagnosis not present

## 2018-03-16 DIAGNOSIS — I2510 Atherosclerotic heart disease of native coronary artery without angina pectoris: Secondary | ICD-10-CM | POA: Diagnosis not present

## 2018-03-16 DIAGNOSIS — G8194 Hemiplegia, unspecified affecting left nondominant side: Secondary | ICD-10-CM

## 2018-03-17 DIAGNOSIS — I2510 Atherosclerotic heart disease of native coronary artery without angina pectoris: Secondary | ICD-10-CM | POA: Diagnosis not present

## 2018-03-17 DIAGNOSIS — I6789 Other cerebrovascular disease: Secondary | ICD-10-CM | POA: Diagnosis not present

## 2018-03-17 DIAGNOSIS — I639 Cerebral infarction, unspecified: Secondary | ICD-10-CM | POA: Diagnosis not present

## 2018-03-17 DIAGNOSIS — G8194 Hemiplegia, unspecified affecting left nondominant side: Secondary | ICD-10-CM | POA: Diagnosis not present

## 2018-03-19 ENCOUNTER — Other Ambulatory Visit: Payer: Self-pay | Admitting: Cardiovascular Disease

## 2018-03-24 ENCOUNTER — Encounter: Payer: Self-pay | Admitting: Gastroenterology

## 2018-03-24 ENCOUNTER — Ambulatory Visit (INDEPENDENT_AMBULATORY_CARE_PROVIDER_SITE_OTHER): Payer: Medicare HMO | Admitting: Gastroenterology

## 2018-03-24 VITALS — BP 118/78 | HR 66 | Ht 66.0 in | Wt 224.5 lb

## 2018-03-24 DIAGNOSIS — R197 Diarrhea, unspecified: Secondary | ICD-10-CM

## 2018-03-24 DIAGNOSIS — K219 Gastro-esophageal reflux disease without esophagitis: Secondary | ICD-10-CM | POA: Diagnosis not present

## 2018-03-24 MED ORDER — DIPHENOXYLATE-ATROPINE 2.5-0.025 MG PO TABS: 1.0000 | ORAL_TABLET | Freq: Every day | ORAL | 0 refills | Status: DC | PRN

## 2018-03-24 MED ORDER — PANCRELIPASE (LIP-PROT-AMYL) 40000-126000 UNITS PO CPEP: 1.0000 | ORAL_CAPSULE | Freq: Three times a day (TID) | ORAL | 0 refills | Status: DC

## 2018-03-24 MED ORDER — PANTOPRAZOLE SODIUM 40 MG PO TBEC: 40.0000 mg | DELAYED_RELEASE_TABLET | Freq: Every day | ORAL | 6 refills | Status: DC

## 2018-03-24 MED ORDER — DICYCLOMINE HCL 10 MG PO CAPS: 10.0000 mg | ORAL_CAPSULE | Freq: Four times a day (QID) | ORAL | 0 refills | Status: DC

## 2018-03-24 NOTE — Progress Notes (Signed)
IMPRESSION and PLAN:    #1.  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  Negative colonoscopy 2015 and 2018 (except for small tubular adenoma), negative TI biopsies for crohn's and random colonic biopsies for microscopic colitis, negative CT scan of the abdomen and pelvis for any evidence of chronic pancreatitis.     #2.  Family history of colon cancer     Plan: - Continue Bentyl 10 mg po qid - Continue Lomotil 2/day (60), 6 ref. - Continue protonix 40mg  po qd. - Stop ranitidine. - He does not want to go back on pancreatic enzymes due to cost (Creon) I had samples of Zenpep which have given it to him to try. - FU in 6 months, earlier in case of any problems. 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   He is here for routine follow-up visit.  He apparently has seen a neurologist as well.  He wanted me to fill prescription for Lomotil as he is doing much better on Lomotil and Bentyl.  He has stopped taking Creon due to cost.  Denies having any significant GI complaints    Review of systems:       Past Medical History:  Diagnosis Date  . Chronic neck and back pain   . Coronary artery disease    drug-eluting stents placed in proximal and mid LAD  . Deviated septum   . Diabetic retinopathy (HCC)   . GERD (gastroesophageal reflux disease)    "occasionally" (03/23/2015)  . Headache    "more than 2/wk" (03/23/2015)  . Heart attack (HCC)    "in 1997 was told I had signs of a small heart attack that I didn't know I'd had"  . High cholesterol   . History of gout    "when I was younger"  . Hypertension   . Kidney stones   . Migraine    "had my 1st and only in 12/2014" (03/23/2015)  . Peripheral vascular disease (HCC)    carotid artery disease  . PONV (postoperative nausea and vomiting)     "was told I was confused and combative in recovery from the narcotics w/my carotid OR"  . Sleep apnea    "severe; couldn't afford mask" (03/23/2015)  . Stroke [facility]) 2014   "on walker for 2 months; left side is weaker and numb since" (03/23/2015)  . Type II diabetes mellitus (HCC)     Current Outpatient Medications  Medication Sig Dispense Refill  . aspirin EC 81 MG tablet Take 1 tablet (81 mg total) by mouth daily. 30 tablet 0  . Cholecalciferol (VITAMIN D) 2000 UNITS tablet Take 2,000 Units by mouth daily.    . clopidogrel (PLAVIX) 75 MG tablet Take 1 tablet (75 mg total) by mouth daily. 30 tablet 0  . cyclobenzaprine (FLEXERIL) 5 MG tablet Take 5 mg by mouth daily as needed for muscle spasms.     Marland Kitchen dicyclomine (BENTYL) 10 MG capsule Take 10 mg by mouth 4 (four) times daily.     . diphenoxylate-atropine (LOMOTIL) 2.5-0.025 MG tablet Take 1 tablet by mouth 3 (three) times daily as needed for diarrhea or loose stools.     . fluticasone (FLONASE) 50 MCG/ACT nasal spray Place 1 spray into both nostrils 2 (two) times daily as needed for allergies.     Marland Kitchen  furosemide (LASIX) 80 MG tablet Take 1 tablet alternating with 1/2 tablet every other day 30 tablet 12  . gabapentin (NEURONTIN) 100 MG capsule Take 1 capsule (100 mg total) by mouth 3 (three) times daily. (Patient taking differently: Take 300 mg by mouth at bedtime. ) 90 capsule 0  . glimepiride (AMARYL) 4 MG tablet Take 4 mg by mouth 2 (two) times daily.    . hydrALAZINE (APRESOLINE) 25 MG tablet TAKE 1 TABLET BY MOUTH AT BEDTIME 90 tablet 3  . insulin NPH Human (HUMULIN N,NOVOLIN N) 100 UNIT/ML injection Inject into the skin as directed. Pt takes 40 units in the morning and 55 units at bedtime    . isosorbide mononitrate (IMDUR) 30 MG 24 hr tablet TAKE ONE-HALF TABLET BY MOUTH ONCE DAILY 45 tablet 3  . loperamide (IMODIUM) 2 MG capsule Take 2-4 mg by mouth as needed for diarrhea or loose stools.     . metFORMIN (GLUCOPHAGE-XR) 500 MG 24 hr  tablet Take 500 mg by mouth 2 (two) times daily.    . metoprolol succinate (TOPROL-XL) 25 MG 24 hr tablet TAKE 1/2 (ONE-HALF) TABLET BY MOUTH ONCE DAILY 45 tablet 4  . nitroGLYCERIN (NITROSTAT) 0.4 MG SL tablet Place 1 tablet (0.4 mg total) under the tongue every 5 (five) minutes as needed for chest pain. 25 tablet 11  . pantoprazole (PROTONIX) 40 MG tablet Take 40 mg by mouth daily.    . pravastatin (PRAVACHOL) 20 MG tablet Take 1 tablet (20 mg total) by mouth at bedtime. 30 tablet 6  . ranitidine (ZANTAC) 300 MG tablet Take 300 mg by mouth at bedtime.     . tamsulosin (FLOMAX) 0.4 MG CAPS capsule Take 1 capsule (0.4 mg total) by mouth daily. 30 capsule 1  . insulin regular (NOVOLIN R) 100 units/mL injection daily before breakfast. Sliding Scale    . Polyethyl Glycol-Propyl Glycol (SYSTANE OP) Place 1 drop into both eyes daily as needed (dry eyes).      No current facility-administered medications for this visit.     Patient's surgical history, family medical history, social history and allergies were all reviewed in Epic    Physical Exam:     BP 118/78   Pulse 66   Ht 5\' 6"  (1.676 m)   Wt 224 lb 8 oz (101.8 kg)   BMI 36.24 kg/m   GENERAL:  Alert, oriented, cooperative, not in acute distress. PSYCH: :Pleasant, normal mood and affect. HEENT:  conjunctiva pink, mucous membranes moist, neck supple without masses. No jaundice. CARDIAC:  S1 S2 normal. No murmers. PULM: Normal respiratory effort, lungs CTA bilaterally, no wheezing. ABDOMEN: Inspection: No visible peristalsis, no abnormal pulsations, skin normal.  Palpation/percussion: Soft, nontender, nondistended, no rigidity, no abnormal dullness to percussion, no hepatosplenomegaly and no palpable abdominal masses.  Auscultation: Normal bowel sounds, no abdominal bruits. Rectal exam: Deferred SKIN:  turgor, no lesions seen. Musculoskeletal:  Normal muscle tone, normal strength. NEURO: Alert and oriented x 3, some left-sided  weakness   Tzvi Economou,MD 03/24/2018, 3:56 PM

## 2018-03-24 NOTE — Patient Instructions (Signed)
If you are age 59 or older, your body mass index should be between 23-30. Your Body mass index is 36.24 kg/m. If this is out of the aforementioned range listed, please consider follow up with your Primary Care Provider.  If you are age 46 or younger, your body mass index should be between 19-25. Your Body mass index is 36.24 kg/m. If this is out of the aformentioned range listed, please consider follow up with your Primary Care Provider.   We have sent the following medications to your pharmacy for you to pick up at your convenience: Lomotil 1-2 tablets by mouth once daily as needed.  Bentyl 10mg  by mouth four times daily. Protonix 40mg  by mouth once daily.  We have given you samples of the following medication to take: Zenpep 40,000 units take one capsule three times daily with meals.   Thank you,  Dr. Lynann Bologna

## 2018-04-13 ENCOUNTER — Other Ambulatory Visit: Payer: Self-pay | Admitting: *Deleted

## 2018-04-13 DIAGNOSIS — E7849 Other hyperlipidemia: Secondary | ICD-10-CM

## 2018-04-13 MED ORDER — PRAVASTATIN SODIUM 20 MG PO TABS: 20.0000 mg | ORAL_TABLET | Freq: Every day | ORAL | 2 refills | Status: DC

## 2018-04-20 ENCOUNTER — Ambulatory Visit: Payer: Medicare HMO | Admitting: Neurology

## 2018-04-20 ENCOUNTER — Encounter: Payer: Self-pay | Admitting: Neurology

## 2018-04-20 VITALS — BP 147/69 | HR 72 | Ht 66.0 in | Wt 231.2 lb

## 2018-04-20 DIAGNOSIS — E1159 Type 2 diabetes mellitus with other circulatory complications: Secondary | ICD-10-CM

## 2018-04-20 DIAGNOSIS — Z8673 Personal history of transient ischemic attack (TIA), and cerebral infarction without residual deficits: Secondary | ICD-10-CM | POA: Diagnosis not present

## 2018-04-20 DIAGNOSIS — I6521 Occlusion and stenosis of right carotid artery: Secondary | ICD-10-CM

## 2018-04-20 DIAGNOSIS — I10 Essential (primary) hypertension: Secondary | ICD-10-CM

## 2018-04-20 DIAGNOSIS — I63311 Cerebral infarction due to thrombosis of right middle cerebral artery: Secondary | ICD-10-CM | POA: Diagnosis not present

## 2018-04-20 HISTORY — DX: Personal history of transient ischemic attack (TIA), and cerebral infarction without residual deficits: Z86.73

## 2018-04-20 NOTE — Progress Notes (Signed)
STROKE NEUROLOGY FOLLOW UP NOTE  NAME: Johnathan Keith DOB: Nov 09, 1961  REASON FOR VISIT: stroke follow up HISTORY FROM: pt and chart  Today we had the pleasure of seeing Johnathan Keith in follow-up at our Neurology Clinic. Pt was accompanied by no one.   History Summary Johnathan Keith presents today as clinic follow up of stroke and right carotid stenosis > 70% in 2014. He had left hemiparesis in 2014 but CT and MRI did not show acute stroke, however, by my read, it is questionable a small DWI change at right pons without corresponding ADC attenuation. He has multiple stroke risk factors including HTN, DM, HLD, and right carotid stenosis.  He is on ASA and lipitor now for stroke prevention.  08/04/14 follow up - During the interval time, the patient has been doing fine without changes. He has seen his PCP and made changes for his DM regimen since his A1c was very high.    His right carotid stenosis > 70% a year ago without intervention, at that time his ICA/CCA ratio was 3.19, as compared to his left ICA/CCA ratio 1.02.  During the interval, we repeated CUS which showed right proximal/mid ICA >70% stenosis, the right ICA/CCA ratio 7.06 and left ICA/CCA ratio 0.95. The right ICA stenosis progressed from one year ago. He also complains that he has finger and toes tingling numbness as well as distal extremities cold with purple color from time to time.   11/09/14 follow up - he has followed up with Dr. [name] and had right CEA done on 09/26/14. Since then, he has intermittent chocking on eating solid food or drinking liquid. He is going to see GI next week. He also followed up with cardiology for low BP and his lisinopril decreased from 40mg  daily to 20mg  daily. He stated that his latest A1C check was 9.9 down from previous 12.3. His BP was well controlled, today in clinic 121/72.  He stated that he has migraine hx and could happen once a week, if not taking medications it may last 2 days. But if  takes Excedrin early in the course, it may last only 1hr after lying down and sleep. He continued to complains of tingling bilateral fingertips and toes. Denies smoking.    03/13/15 follow up - he was doing well. He stated that he can not stand too long otherwise he will have LBP, leg pain and neck pain and he has to lie down. He still has leg numbness and tingling. His sugar not in good control and around 200s due to insurance not covering new generation of DM meds which keeps his glucose at 150s. Now he has to go back to metformin and insulin. His BP better controlled, today 133/75. His right neck wound healed well and he is going to see Dr. [name] in 3 weeks and will do CUS again there. His swallow much better and only occasional chock with certain food.   09/12/15 follow up - he is doing the same. He had EMG/NCS with Dr. 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 The presence of active denervation at left thoracic paraspinals could also indicate more widespread central nervous system degenerative process, differentiation diagnosis also includes diabetic thoracic radiculopathies, nutritional deficiencies, inflammatory process. There was evidence of severe bilateral carpal tunnel syndromes." MRI lumbar and cervical spine done showed cervical radiculopathy but lack of lumbosacral radiculopathy. Recommended PT and prescribed gabapentin to him. He  stated that gabapentin helped him with pain but not get the numbness tingling away. He had right shoulder injections for shoulder pain and currently following with ortho to get knee injections too. He is yet to see ortho for carpel tunnel syndrome.   03/14/16 follow up - he is doing the somewhat better. No recurrent stroke like symptoms but still has LBP and sexual dysfunction. He follows with ortho for CTS and scheduled later for left CTS  surgery. Completed PT/OT and now at [facility] for self exercise. Has ? Macular degeneration, following with ophthal for retinal injection. BP 160/94.  12/18/16 follow up - pt has been doing well from stroke standpoint. Followed with cardiology and [facility] for carotid stenosis, repeat CUS in 10/2016 showed 1-39% bilaterally. Started in 11/2016 to have HA, at right back of head, with right side neck pain, tightness and dizziness feeling. No fall. Initially more painful, over time, it stabilized and improved but has not resolved. On DAPT and lipitor. BP still high at 160/85.  Interval History Patient was admitted to [facility] on [facility]/16/2018 due to worsening left-sided weakness for 2 to 3 days.  He was able to ambulate independently with mild residual weakness in the left leg since stroke in 2014.  However, in early April, it was worsening left-sided weakness, he fell to the floor due to weakness, and had to hold onto his wife wife while walking, eventually had use walker, and had to pull his left leg in order to get into the car.  CT head showed new lacunar infarct in the right CR, probably subacute.  MRI confirmed acute/subacute 1.5 cm right CR infarct.  MRA, carotid Doppler, TTE unremarkable.  EF 55 to 60% A1c 9.9 and LDL 55.4.  He was discharged with aspirin, Plavix and statin.  PT/OT recommend rolling walker.  Since discharge, patient has been doing stable, still walking with walker.  Glucose better controlled, however still fluctuates.    REVIEW OF SYSTEMS: Full 14 system review of systems performed and notable only for those listed below and in HPI above, all others are negative:  Constitutional:  Cardiovascular:  Leg swelling, murmur Ear/Nose/Throat: Facial swelling, trouble swallowing Skin:  Eyes:  eye redness Respiratory: SOB, choking, cough Gastroitestinal: Abdominal pain, diarrhea, incontinence of bowels Genitourinary:  Hematology/Lymphatic: Bruises easily Endocrine:  Musculoskeletal: back pain,  aching muscles, muscle cramps, walking difficulty, neck pain, neck stiffness  Allergy/Immunology: frequent infections  Neurological: dizziness, numbness, weakness, headache, tremors Psychiatric: Hallucinations Sleep: apnea, frequent waking  The following represents the patient's updated allergies and side effects list: No Known Allergies  The neurologically relevant items on the patient's problem list were reviewed on today's visit.  Neurologic Examination  A problem focused neurological exam (12 or more points of the single system neurologic examination, vital signs counts as 1 point, cranial nerves count for 8 points) was performed.  Blood pressure (!) 147/69, pulse 72, height 5\' 6"  (1.676 m), weight 231 lb 3.2 oz (104.9 kg).  General - Well nourished, well developed, in no apparent distress.  Ophthalmologic - Sharp disc margins OU.  Cardiovascular - Regular rate and rhythm with no murmur.   Neck - supple, no nuchal rigidity. Carotid no bruits. Mild right occipital neuralgia on palpation.  Mental Status -  Level of arousal and orientation to time, place, and person were intact.  Language including expression, naming, repetition, comprehension was assessed and found intact.  Attention span and concentration were normal.  Recent and remote memory were intact.  Fund [employer]  was assessed and was intact.  Cranial Nerves II - XII -  II - Vision intact OU.  III, IV, VI - Extraocular movements intact.  V - Facial sensation decreased on the left.  VII - Facial movement intact bilaterally.  VIII - Hearing & vestibular intact bilaterally.  X - Palate elevates symmetrically.  XI - Chin turning & shoulder shrug intact bilaterally.  XII - Tongue protrusion intact.  Motor Strength - The patient's strength was normal in RUE and RLE, but 5-/5 LUE and LLE. Bulk was normal and fasciculations were absent.  Motor Tone - Muscle tone was assessed at the neck and appendages and was normal.    Reflexes - The patient's reflexes were normal in all extremities and he had no pathological reflexes.  Sensory - Light touch, temperature/pinprick were assessed and were decreased on left UE.  Coordination - The patient had normal movements in the hands and feet with no ataxia or dysmetria. Tremor was absent.  Gait and Station - broad based gait, walk with walker, left mild hemiparetic gait  Images and labs:  I have personally reviewed the radiological images below and agree with the radiology interpretations.  CTA neck - Atherosclerotic disease in the right carotid bifurcation. 15 mm above the bifurcation, focal severe stenosis measuring 75% diameter stenosis Mild atherosclerotic disease left carotid bifurcation without significant stenosis Both vertebral arteries widely patent.  CUS - 80-99% right ICA stenosis.  2D echo - - Left ventricle: The cavity size was normal. Wall thickness was increased in a pattern of mild LVH. Systolic function was normal. The estimated ejection fraction was in the range of 55% to 60%. Wall motion was normal; there were no regional wall motion abnormalities. Doppler parameters are consistent with abnormal left ventricular relaxation (grade 1 diastolic dysfunction). The E/e&' ratio is >15, suggesting elevated LV filling pressure. - Aortic valve: Sclerosis without stenosis. There was trace to mild regurgitation. - Left atrium: LA volume/ BSA = 20.7 ml/m2. The atrium was normal in size. - Tricuspid valve: There was trivial regurgitation. - Pulmonary arteries: PA peak pressure: 24 mm Hg (S). Impressions: - LVEF 55-60%, mild LVH, normal wall motion, diastolic dysfunction, elevated LV filling pressure, normal LA size, aortic valve sclerosis with trace to mild AI.  MRI C- spine: 1. Left paramedian disc herniation superimposed on broader disc protrusion at C6-C7 causing severe left neural foraminal narrowing that could lead to left C7  nerve root compression. 2. Small right paramedian disc protrusion at C7-T1 causing mild to moderate right foraminal narrowing with no nerve root impingement noted. 3. Milder degenerative changes at C3-C4 and C5-C6 do not lead to any foraminal narrowing or nerve root impingement.  MRI L spine: This is a borderline normal age-appropriate MRI showing minimal degenerative changes at T12-L1 and L5-S1 that did not lead to any nerve root impingement.   EMG - This is an abnormal study. 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 The presence of active denervation at left thoracic paraspinals could also indicate more widespread central nervous system degenerative process, differentiation diagnosis also includes diabetic thoracic radiculopathies, nutritional deficiencies, inflammatory process. There was evidence of severe bilateral carpal tunnel syndromes.   MRI and MRA brain 03/16/2018 1.  Acute/subacute nonhemorrhagic 1.5 cm white matter infarct adjacent to the right lateral ventricle 2.  Periventricular and brainstem white matter disease is moderately advanced for age.  This likely reflect the sequelae of chronic microvascular ischemia 3.  MRA circle of Willis demonstrate no focal stenosis, aneurysm or branch vessel occlusion. 4.  Residual mucosal thickening and chronic wall thickening despite bilateral maxillary antrostomies and partial sigmoid sinus resections.  Carotid Doppler 03/17/2018 Right: Given history of right CEA, noted that duplex criteria had not been validated in the setting of prior surgery/stenting, however, there is no evidence on the duplex of developing stenosis. Left: Color duplex indicating minimal heterogeneous plaque, with no hemodynamically significant stenosis by duplex criteria in extracranial cerebrovascular circulation.  TTE 03/17/2018 1.   Dizziness technically difficult study with suboptimal views.  Definity used for endocardial resolution. 2.  There is borderline concentric left ventricular hypertrophy. 3.  Overall left ventricular systolic function is normal with an EF between 55 to 60%. 4.  Interatrial and interventricular septum intact 5.  There is trace to mild mitral regurgitation. 6.  Mild tricuspid regurgitation present  CT had 03/16/2018 New lacunar infarct in the right coronary radiata, probably subacute.  Mild chronic small vessel ischemic changes.  Component     Latest Ref Rng 07/27/2014  Cholesterol, Total     100 - 199 mg/dL 161  Triglycerides     0 - 149 mg/dL 096 (H)  HDL     >04 mg/dL 30 (L)  VLDL Cholesterol Cal     5 - 40 mg/dL 42 (H)  LDL (calc)     0 - 99 mg/dL 84  Total CHOL/HDL Ratio     0.0 - 5.0 ratio units 5.2 (H)  TSH     0.450 - 4.500 uIU/mL 2.860  Free T4     0.82 - 1.77 ng/dL 5.40  Hgb J8J MFr Bld     4.8 - 5.6 % 12.3 (H)  Est. average glucose Bld gHb Est-mCnc      306   03/16/2018 LDL 55.4, A1c 9.9  Assessment: As you may recall, he is a 57 y.o. Caucasian male with PMH of HTN, HLD, DM, CAD and right ICA stenosis followed up in clinic today for stroke and right ICA stenosis. He had left hemiparesis in 2014 but CT and MRI did not show acute stroke, however, by my read, it is questionable a small DWI change at right pons without corresponding ADC attenuation. Regardless, he has multiple stroke risk factors including HTN, DM, HLD, and right carotid stenosis. He is on ASA, plavix and lipitor now for stroke and cardiac prevention. He also has right carotid stenosis and had right CEA by Dr. [name]. Followed with [facility] and repeat CUS no significant stenosis.   EMG and MRI evaluation showed possible lumbar and cervical radiculopathy, completed PT/OT. He also has severe carpel tunnel syndrome and following with ortho, and was on wrist brace but now not doing repetitive motion and symptoms much better,  no surgery was pursued. Developed right mild occipital neuralgia, treated with short term flexeril. He refused occipital nerve block. BP and glucose still not in good control.  Suffered another stroke on 03/16/2018, CT showed new subacute lacunar infarct in the right CR.  MRI confirmed acute/subacute 1.5 cm right CR infarct.  MRA, carotid Doppler, TTE unremarkable.  EF 55 to 60%.  A1c 9.9 and LDL 55.4.  He was discharged with aspirin, Plavix and statin.  PT/OT recommend rolling walker.    Plan:  - continue ASA and plavix and pravastatin for stroke and cardiac prevention - check BP and glucose at home  - follow up with endocrinologist for better DM control  - continue outpt PT/OT therapy - avoid falls,  use walker for safety - Follow up with your primary care physician for stroke risk factor modification. Recommend maintain blood pressure goal <130/80, diabetes with hemoglobin A1c goal below 6.5% and lipids with LDL cholesterol goal below 70 mg/dL.  - follow up with Dr. [name] and Dr. Allyson Sabal as scheduled - diabetic diet and self exercise at home.  - follow up in 3 months with Shanda Bumps  I spent more than 25 minutes of face to face time with the patient. Greater than 50% of time was spent in counseling and coordination of care. We discussed further stroke risk factor modification, continue antiplatelet, and better diabetes management.  No orders of the defined types were placed in this encounter.  No orders of the defined types were placed in this encounter.   Patient Instructions  - continue ASA and plavix and pravastatin for stroke and cardiac prevention - check BP and glucose at home  - follow up with endocrinologist for better DM control  - continue outpt PT/OT therapy - avoid falls, use walker for safety - Follow up with your primary care physician for stroke risk factor modification. Recommend maintain blood pressure goal <130/80, diabetes with hemoglobin A1c goal below 6.5% and lipids with  LDL cholesterol goal below 70 mg/dL.  - follow up with Dr. [name] and Dr. Allyson Sabal as scheduled - diabetic diet and self exercise at home.  - follow up in 3 months with Fernand Parkins, MD PhD [facility] [address], [address] [place], [place] [address] [phone]

## 2018-04-20 NOTE — Patient Instructions (Addendum)
-   continue ASA and plavix and pravastatin for stroke and cardiac prevention - check BP and glucose at home  - follow up with endocrinologist for better DM control  - continue outpt PT/OT therapy - avoid falls, use walker for safety - Follow up with your primary care physician for stroke risk factor modification. Recommend maintain blood pressure goal <130/80, diabetes with hemoglobin A1c goal below 6.5% and lipids with LDL cholesterol goal below 70 mg/dL.  - follow up with Dr. [name] and Dr. Allyson Sabal as scheduled - diabetic diet and self exercise at home.  - follow up in 3 months with Johnathan Keith

## 2018-05-08 ENCOUNTER — Ambulatory Visit: Payer: Medicare HMO | Admitting: Cardiovascular Disease

## 2018-05-15 ENCOUNTER — Encounter: Payer: Self-pay | Admitting: Cardiovascular Disease

## 2018-05-15 ENCOUNTER — Ambulatory Visit (INDEPENDENT_AMBULATORY_CARE_PROVIDER_SITE_OTHER): Payer: Medicare HMO | Admitting: Cardiovascular Disease

## 2018-05-15 ENCOUNTER — Telehealth: Payer: Self-pay | Admitting: Neurology

## 2018-05-15 VITALS — BP 168/102 | HR 94 | Ht 66.0 in | Wt 230.0 lb

## 2018-05-15 DIAGNOSIS — I6522 Occlusion and stenosis of left carotid artery: Secondary | ICD-10-CM

## 2018-05-15 DIAGNOSIS — E7800 Pure hypercholesterolemia, unspecified: Secondary | ICD-10-CM | POA: Diagnosis not present

## 2018-05-15 DIAGNOSIS — R0600 Dyspnea, unspecified: Secondary | ICD-10-CM

## 2018-05-15 DIAGNOSIS — I63311 Cerebral infarction due to thrombosis of right middle cerebral artery: Secondary | ICD-10-CM | POA: Diagnosis not present

## 2018-05-15 DIAGNOSIS — I10 Essential (primary) hypertension: Secondary | ICD-10-CM | POA: Diagnosis not present

## 2018-05-15 DIAGNOSIS — I6521 Occlusion and stenosis of right carotid artery: Secondary | ICD-10-CM | POA: Diagnosis not present

## 2018-05-15 DIAGNOSIS — G4730 Sleep apnea, unspecified: Secondary | ICD-10-CM | POA: Diagnosis not present

## 2018-05-15 DIAGNOSIS — I2510 Atherosclerotic heart disease of native coronary artery without angina pectoris: Secondary | ICD-10-CM | POA: Diagnosis not present

## 2018-05-15 DIAGNOSIS — R0609 Other forms of dyspnea: Secondary | ICD-10-CM

## 2018-05-15 DIAGNOSIS — Z9861 Coronary angioplasty status: Secondary | ICD-10-CM

## 2018-05-15 MED ORDER — METOPROLOL SUCCINATE ER 25 MG PO TB24: 75.0000 mg | ORAL_TABLET | Freq: Every day | ORAL | 6 refills | Status: DC

## 2018-05-15 MED ORDER — HYDRALAZINE HCL 25 MG PO TABS: 25.0000 mg | ORAL_TABLET | Freq: Three times a day (TID) | ORAL | 6 refills | Status: DC

## 2018-05-15 NOTE — Assessment & Plan Note (Signed)
Of hyperlipidemia on Pravachol 20 mg a day followed by his PCP.

## 2018-05-15 NOTE — Progress Notes (Signed)
05/15/2018 Johnathan Keith   01-02-1961  [id number]  Primary Physician Sistasis, Rosanne Ashing, MD Primary Cardiologist: Runell Gess MD FACP, [place], [place], [place]  HPI:  Johnathan Keith is a 57 y.o.  mildly overweight married Caucasian male who I last saw in the office  10/07/2017.  In addition to hypertension, his cardiovascular history is significant for prior TIA subsequent to carotid artery disease. He underwent right carotid endarterectomy performed by Dr. [name] on 09/26/2014. There is also question of remote myocardial infarction in the past. He recently underwent evaluation with a 2-D echocardiogram that was performed preoperatively which showed normal LV function as well as a Myoview stress test that had subtle inferior and lateral ischemia. At that time he had no SOB or chest pain and was cleared for surgery. His history is also notable for hyperlipidemia and diabetes. He was last seen by Dr. Allyson Sabal 10/26/2014. During that time, he complained of symptomatic hypotension with systolic blood pressures in the low 90s. Subsequently Dr. Allyson Sabal decided to decrease his lisinopril down from 40 mg to 20 mg daily and now down to 10 mg daily. He is taking a forth of 40 mg table. Since that time he has been seen by our office pharmacist Phillips Hay for labile blood pressures. Though over all BP is improved. He is on most of his meds.   He has been on lasix 80 mg daily for a year for lower ext. Edema. His K+ has been elevated at times and currently he is off the kdur.His most recent lipid profile in our chart for/13/16 revealed total cholesterol 132, LDL 54 and HDL of 34.  In addition to lower ext edema he has DOE. No chest pain but walking in grocery store and to car he is very SOB. This has progressed since Nuc study. He has several risk factors: DM, hyperlipidemia, vascular disease and family hx CAD. His nuclear study was high risk for anterolateral and inferior ischemia. Based on this, and in  light of his increasing dyspnea on exertion as a potential anginal equivalent, he underwent diagnostic coronary arteriography by myself 03/23/15 revealing proximal and mid LAD stenoses which I intervened on with drug-eluting stents. The circumflex and RCA were free of significant disease as LV function was preserved. Since intervention the symptoms have markedly improved.   since I saw him a year ago he's remained clinically stable. He underwent nasal surgery 06/04/16. He saw Corine Shelter in the office 08/02/16 was normal as well. He continues to complain of some dyspnea on exertion however.  Since I saw him a year ago he has noticed some recent weight gain thought to be related to fluid accumulation as well as dyspnea with associated chest tightness over the last 6 months. His diuretics were recently adjusted.   Since I saw him in the office approximately a month ago I did obtain a 2-D echocardiogram on 09/05/17 as well as a Myoview stress test on 09/09/17 both of which were normal. He continued to have symptoms of dyspnea which he says are similar to his preintervention symptoms. Because of this I elected to perform outpatient diagnostic cardiac catheterization on 09/29/17 via right radial approach revealing normal LV function with a normal LVEDP, patent stents with 80% mid LAD lesion just beyond the distal stent which I restented using a 2.25 x 12 mm long synergy drug-eluting stent postdilated to 2.43 mm. The symptoms of chest pain have resolved and remained dyspneic.  Since I saw him in the office back  in October, a week after his LAD stent when his symptoms had improved he has had a recurrent stroke in April of this year characterized by disequilibrium.  He was seen at [facility].  Apparently had he had a corona radiata lacunar stroke.  His blood pressure has been difficult to control.  He was told that he had paroxysmal atrial fibrillation as well as a "hole in his heart by 2D echocardiogram and  bubble study.  He has seen Dr. Roda Shutters, his neurologist, in the office for further evaluation and treatment.     Current Meds  Medication Sig  . aspirin 325 MG tablet Take 81 mg by mouth.   . Cholecalciferol (VITAMIN D) 2000 UNITS tablet Take 2,000 Units by mouth daily.  . clopidogrel (PLAVIX) 75 MG tablet Take 1 tablet (75 mg total) by mouth daily.  . cyclobenzaprine (FLEXERIL) 5 MG tablet Take 5 mg by mouth daily as needed for muscle spasms.   Marland Kitchen dicyclomine (BENTYL) 10 MG capsule Take 1 capsule (10 mg total) by mouth 4 (four) times daily.  . furosemide (LASIX) 80 MG tablet Take 1 tablet alternating with 1/2 tablet every other day  . gabapentin (NEURONTIN) 100 MG capsule Take 1 capsule (100 mg total) by mouth 3 (three) times daily. (Patient taking differently: Take 300 mg by mouth at bedtime. )  . glimepiride (AMARYL) 4 MG tablet Take 4 mg by mouth 2 (two) times daily.  . hydrALAZINE (APRESOLINE) 25 MG tablet Take 1 tablet (25 mg total) by mouth 3 (three) times daily.  . hydrochlorothiazide (MICROZIDE) 12.5 MG capsule Take 12.5 mg by mouth as needed (only take when blood pressure systolic is 170 and diastolic is 100).  . insulin NPH Human (HUMULIN N,NOVOLIN N) 100 UNIT/ML injection Inject into the skin as directed. Pt takes 40 units in the morning and 55 units at bedtime  . insulin regular (NOVOLIN R) 100 units/mL injection daily before breakfast. Sliding Scale  . isosorbide mononitrate (IMDUR) 30 MG 24 hr tablet TAKE ONE-HALF TABLET BY MOUTH ONCE DAILY (Patient taking differently: TAKE ONE-HALF TABLET BY MOUTH ONCE DAILY, based on blood pressure  systolic if over 409 and distolic is 100)  . loperamide (IMODIUM) 2 MG capsule Take 2-4 mg by mouth as needed for diarrhea or loose stools.   . metoprolol succinate (TOPROL-XL) 25 MG 24 hr tablet Take 3 tablets (75 mg total) by mouth daily.  . nitroGLYCERIN (NITROSTAT) 0.4 MG SL tablet Place 1 tablet (0.4 mg total) under the tongue every 5 (five) minutes  as needed for chest pain.  . Pancrelipase, Lip-Prot-Amyl, (ZENPEP) 40000-126000 units CPEP Take 1 capsule by mouth 3 (three) times daily with meals.  . pantoprazole (PROTONIX) 40 MG tablet Take 1 tablet (40 mg total) by mouth daily.  . pravastatin (PRAVACHOL) 20 MG tablet Take 1 tablet (20 mg total) by mouth at bedtime. NEED OV.  . ranitidine (ZANTAC) 75 MG tablet Take 75 mg by mouth at bedtime.  . tamsulosin (FLOMAX) 0.4 MG CAPS capsule Take 1 capsule (0.4 mg total) by mouth daily.  . [DISCONTINUED] hydrALAZINE (APRESOLINE) 25 MG tablet TAKE 1 TABLET BY MOUTH AT BEDTIME  . [DISCONTINUED] metoprolol succinate (TOPROL-XL) 25 MG 24 hr tablet TAKE 1/2 (ONE-HALF) TABLET BY MOUTH ONCE DAILY     No Known Allergies  Social History   Socioeconomic History  . Marital status: Married    Spouse name: Not on file  . Number of children: 5  . Years of education: 12th  .  Highest education level: Not on file  Occupational History  . Occupation: Disabled  Social Needs  . Financial resource strain: Not on file  . Food insecurity:    Worry: Not on file    Inability: Not on file  . Transportation needs:    Medical: Not on file    Non-medical: Not on file  Tobacco Use  . Smoking status: Never Smoker  . Smokeless tobacco: Never Used  Substance and Sexual Activity  . Alcohol use: No    Alcohol/week: 0.0 oz  . Drug use: No  . Sexual activity: Not Currently    Partners: Female  Lifestyle  . Physical activity:    Days per week: Not on file    Minutes per session: Not on file  . Stress: Not on file  Relationships  . Social connections:    Talks on phone: Not on file    Gets together: Not on file    Attends religious service: Not on file    Active member of club or organization: Not on file    Attends meetings of clubs or organizations: Not on file    Relationship status: Not on file  . Intimate partner violence:    Fear of current or ex partner: Not on file    Emotionally abused: Not on  file    Physically abused: Not on file    Forced sexual activity: Not on file  Other Topics Concern  . Not on file  Social History Narrative   Patient is married with 5 children.    Patient is right handed.   Patient has hs education.   Patient drinks 4 12 oz can sodas daily.     Review of Systems: General: negative for chills, fever, night sweats or weight changes.  Cardiovascular: negative for chest pain, dyspnea on exertion, edema, orthopnea, palpitations, paroxysmal nocturnal dyspnea or shortness of breath Dermatological: negative for rash Respiratory: negative for cough or wheezing Urologic: negative for hematuria Abdominal: negative for nausea, vomiting, diarrhea, bright red blood per rectum, melena, or hematemesis Neurologic: negative for visual changes, syncope, or dizziness All other systems reviewed and are otherwise negative except as noted above.    Blood pressure (!) 168/102, pulse 94, height 5\' 6"  (1.676 m), weight 230 lb (104.3 kg).  General appearance: alert and no distress Neck: no adenopathy, no carotid bruit, no JVD, supple, symmetrical, trachea midline and thyroid not enlarged, symmetric, no tenderness/mass/nodules Lungs: clear to auscultation bilaterally Heart: regular rate and rhythm, S1, S2 normal, no murmur, click, rub or gallop Extremities: extremities normal, atraumatic, no cyanosis or edema Pulses: 2+ and symmetric Skin: Skin color, texture, turgor normal. No rashes or lesions Neurologic: Alert and oriented X 3, normal strength and tone. Normal symmetric reflexes. Normal coordination and gait  EKG sinus rhythm at 94 without ST or T wave changes.  I personally reviewed this EKG.  ASSESSMENT AND PLAN:   Dyspnea on exertion Improved after stenting of his LAD  Cerebrovascular accident (CVA) due to thrombosis of right middle cerebral artery (HCC) History of multiple strokes in the past dating back to 2014 with a recent stroke in April of this year at  [facility] with symptoms of disequilibrium.  He currently had a lacunar stroke in the corona radiata.  We do not have a discharge summary but the patient says that he was told he had transient atrial fibrillation" hole in his heart by 2D echo with bubbles.  I spoke with Dr.Xu today to relay that information  who said he would see him back and further discuss treatment options.  Carotid artery stenosis- History of carotid artery disease status post right carotid endarterectomy by Dr. [name] October 2015 which he follows by duplex ultrasound.  Hyperlipidemia Of hyperlipidemia on Pravachol 20 mg a day followed by his PCP.  Essential hypertension History of essential hypertension her blood pressure measured today at 168/102.  He is on hydrochlorothiazide, Toprol-XL 50 mg a day and hydralazine 25 mg nightly.  He did provide a blood pressure log to me that showed consistently elevated blood pressures with a heart rate in the 90s today.  I am going to increase his metoprolol from 50 mg to 75 mg extended release once a day and his hydralazine to 3 times daily.  He will keep a blood pressure log and will see Kristen back in 1 month.  I will see him back in 3 months.  CAD -S/P PCI-2016 History of CAD status post LAD stenting by myself in 2016 with re-intervention because of recurrent symptoms of his mid LAD beyond the previously placed stent 09/29/2017 with resolution of his symptoms.  He remains on dual antiplatelet therapy.  Sleep apnea History of obstructive sleep apnea on CPAP which she wears intermittently.      Runell Gess MD FACP,FACC,FAHA, [facility] 05/15/2018 8:58 AM

## 2018-05-15 NOTE — Telephone Encounter (Signed)
Patient follow-up with Dr. Allyson Sabal today and stated that during his admission in the [facility] in 03/2018, he was told to have PFO and A. Fib.  Therefore, Dr. Allyson Sabal called me for further clarification.  I remember that I had reviewed his discharge summary and did not find any above statements.  I requested again discharge summary from [facility] today and reviewed discharge summary, teleneurology consultation note, ED provider notes, 2D echo report and the EKG report.  There was no record of any PFO or atrial fibrillation.  2D echo performed on 03/17/2018 showed no evidence of ASD/VSD by color-flow Doppler analysis.  Negative bubble study.  Intra-atrial and interventricular septum intact.  EKG showed on 03/16/2018 sinus tachycardia, otherwise normal EKG.   We will recommend to continue his current DAPT with aspirin and Plavix. Will relay the information to Dr. Allyson Sabal.   Marvel Plan, MD PhD Stroke Neurology 05/15/2018 10:17 PM

## 2018-05-15 NOTE — Assessment & Plan Note (Signed)
Improved after stenting of his LAD

## 2018-05-15 NOTE — Assessment & Plan Note (Signed)
History of CAD status post LAD stenting by myself in 2016 with re-intervention because of recurrent symptoms of his mid LAD beyond the previously placed stent 09/29/2017 with resolution of his symptoms.  He remains on dual antiplatelet therapy.

## 2018-05-15 NOTE — Patient Instructions (Addendum)
Medication Instructions: Your physician recommends that you continue on your current medications as directed. Please refer to the Current Medication list given to you today.  Increase Hydralazine to 25 mg three times daily.  Increase Metoprolol Succinate to 75 mg (3 tab) daily.   Follow-Up: Your physician recommends that you schedule a follow-up appointment in: 1 month with PharmD in HTN Clinic. Your physician has requested that you regularly monitor and record your blood pressure readings at home. Please use the same machine at the same time of day to check your readings and record them to bring to your follow-up visit.  Your physician recommends that you schedule a follow-up appointment in: 3 months with Dr. Allyson Sabal.  If you need a refill on your cardiac medications before your next appointment, please call your pharmacy.

## 2018-05-15 NOTE — Assessment & Plan Note (Signed)
History of carotid artery disease status post right carotid endarterectomy by Dr. [name] October 2015 which he follows by duplex ultrasound.

## 2018-05-15 NOTE — Assessment & Plan Note (Signed)
History of essential hypertension her blood pressure measured today at 168/102.  He is on hydrochlorothiazide, Toprol-XL 50 mg a day and hydralazine 25 mg nightly.  He did provide a blood pressure log to me that showed consistently elevated blood pressures with a heart rate in the 90s today.  I am going to increase his metoprolol from 50 mg to 75 mg extended release once a day and his hydralazine to 3 times daily.  He will keep a blood pressure log and will see Kristen back in 1 month.  I will see him back in 3 months.

## 2018-05-15 NOTE — Assessment & Plan Note (Signed)
History of obstructive sleep apnea on CPAP which she wears intermittently. 

## 2018-05-15 NOTE — Assessment & Plan Note (Signed)
History of multiple strokes in the past dating back to 2014 with a recent stroke in April of this year at [facility] with symptoms of disequilibrium.  He currently had a lacunar stroke in the corona radiata.  We do not have a discharge summary but the patient says that he was told he had transient atrial fibrillation" hole in his heart by 2D echo with bubbles.  I spoke with Dr.Xu today to relay that information who said he would see him back and further discuss treatment options.

## 2018-05-18 NOTE — Telephone Encounter (Signed)
Thanks I appreciate that

## 2018-05-25 ENCOUNTER — Telehealth: Payer: Self-pay | Admitting: Gastroenterology

## 2018-05-25 MED ORDER — PANCRELIPASE (LIP-PROT-AMYL) 40000-126000 UNITS PO CPEP: 1.0000 | ORAL_CAPSULE | Freq: Three times a day (TID) | ORAL | 3 refills | Status: DC

## 2018-05-25 NOTE — Telephone Encounter (Signed)
Patient states sample medication zenpep works good and he would like a prescription called into [facility].

## 2018-06-01 ENCOUNTER — Other Ambulatory Visit: Payer: Self-pay | Admitting: Cardiovascular Disease

## 2018-06-01 NOTE — Telephone Encounter (Signed)
Rx request sent to pharmacy.  

## 2018-06-15 ENCOUNTER — Ambulatory Visit (INDEPENDENT_AMBULATORY_CARE_PROVIDER_SITE_OTHER): Payer: Medicare HMO | Admitting: Pharmacist Clinician (PhC)/ Clinical Pharmacy Specialist

## 2018-06-15 ENCOUNTER — Encounter: Payer: Self-pay | Admitting: Pharmacist Clinician (PhC)/ Clinical Pharmacy Specialist

## 2018-06-15 VITALS — BP 158/84 | HR 78

## 2018-06-15 DIAGNOSIS — I10 Essential (primary) hypertension: Secondary | ICD-10-CM

## 2018-06-15 MED ORDER — BENAZEPRIL HCL 10 MG PO TABS: 10.0000 mg | ORAL_TABLET | Freq: Two times a day (BID) | ORAL | 5 refills | Status: DC

## 2018-06-15 NOTE — Progress Notes (Signed)
Patient ID: Johnathan Keith                 DOB: [birth year]                      MRN: [id number]     HPI: Johnathan Keith is a 57 y.o. male patient of Dr Allyson Sabal referred to the hypertension clinic.  Johnathan Keith' past history is notable for CAD, labile DM, HTN, HLD, TIA, OSA, and PVD, s/p RCEA 2015. He has had labile HTN and at times will self-adjust his medications. Echo Sept 2017 showed normal LVF, mild LVH, grade 1 DD.  His last A1c was 6.7.  Most recently, he was admitted to [facility] on April 8 with a lacunar infarct.   He continues to use a walker, but is otherwise doing well.    He was last seen in [facility] about a year ago, and his pressure at that time was still elevated, although it was noted he would have up to a 36 point drop in pressure with positional changes.  Patient reports that his pressure did well for much of last year and until his stroke in April.  Since then he feels that it has been"out of control".   He continues to have dyspnea on exertion, but no problems with edema or chest pain.    When he saw Dr. Allyson Sabal last month the metoprolol was increased from 50 mg to 75 mg daily and the hydralazine from 25 mg daily to 25 mg three times daily.  His primary MD also added benazepril 10 mg once daily around the same time.  He reports no concerns with any of his medications, and has not noted any orthostatic problems recently.  Current HTN meds:  metoprolol succinate 75 mg daily (lunch) Hydralazine 25 mg daily (am, supper, bedtime) Isosorbide mononotrate 30mg  daily  (every morning) Furosemide 40mg  daily and 80mg  alternating (every morning)  Previously tried:  Lisinopril 10-40mg  daily - stopped due to labile BP   BP goal: <160/80  Family History: reports heart disease form mother and father, hypertension and diabeted from mother, father, brother, ans sister.  Social History: denies smoking, smokeless tobacco, alcohol use or other drugs; caffeine only when eating out Diet: no table  salt, avoiding high potassium vegetables, mainly eating frozen vegetables and meat, caffeine freed diet drinks.  Not adding salt, but not actively avoiding pre salted itmes  Exercise: not able, states feels chest tightness even going up/down stairs; grocery shopping wears him out, walks with cart support.  Diet:  Patient reports working hard to cut sodium out of his diet and limit ot no more than 2,000 mg per day.  Has started buying salt free vegetables and not adding salt to foods at home.   Home BP readings: Morning avg (19 readings)  165/97   Evening readings (15 readings) 165/92    **OMRON home cuff/device determied to be accurate within ** - last summer (2018)  Wt Readings from Last 3 Encounters:  05/15/18 230 lb (104.3 kg)  04/20/18 231 lb 3.2 oz (104.9 kg)  03/24/18 224 lb 8 oz (101.8 kg)   BP Readings from Last 3 Encounters:  06/15/18 (!) 158/84  05/15/18 (!) 168/102  04/20/18 (!) 147/69   Pulse Readings from Last 3 Encounters:  06/15/18 78  05/15/18 94  04/20/18 72    Past Medical History:  Diagnosis Date  . Chronic neck and back pain   . Coronary artery disease  drug-eluting stents placed in proximal and mid LAD  . Deviated septum   . Diabetic retinopathy (HCC)   . GERD (gastroesophageal reflux disease)    "occasionally" (03/23/2015)  . Headache    "more than 2/wk" (03/23/2015)  . Heart attack (HCC)    "in 1997 was told I had signs of a small heart attack that I didn't know I'd had"  . High cholesterol   . History of gout    "when I was younger"  . Hypertension   . Kidney stones   . Migraine    "had my 1st and only in 12/2014" (03/23/2015)  . Peripheral vascular disease (HCC)    carotid artery disease  . PONV (postoperative nausea and vomiting)    "was told I was confused and combative in recovery from the narcotics w/my carotid OR"  . Sleep apnea    "severe; couldn't afford mask" (03/23/2015)  . Stroke [facility]) 2014   "on walker for 2 months; left  side is weaker and numb since" (03/23/2015)  . Type II diabetes mellitus (HCC)     Current Outpatient Medications on File Prior to Visit  Medication Sig Dispense Refill  . aspirin 325 MG tablet Take 81 mg by mouth.     . Cholecalciferol (VITAMIN D) 2000 UNITS tablet Take 2,000 Units by mouth daily.    . clopidogrel (PLAVIX) 75 MG tablet TAKE 1 TABLET BY MOUTH ONCE DAILY 30 tablet 0  . cyclobenzaprine (FLEXERIL) 5 MG tablet Take 5 mg by mouth daily as needed for muscle spasms.     Marland Kitchen dicyclomine (BENTYL) 10 MG capsule Take 1 capsule (10 mg total) by mouth 4 (four) times daily. 120 capsule 0  . furosemide (LASIX) 80 MG tablet Take 1 tablet alternating with 1/2 tablet every other day 30 tablet 12  . gabapentin (NEURONTIN) 100 MG capsule Take 1 capsule (100 mg total) by mouth 3 (three) times daily. (Patient taking differently: Take 300 mg by mouth at bedtime. ) 90 capsule 0  . glimepiride (AMARYL) 4 MG tablet Take 4 mg by mouth 2 (two) times daily.    . hydrALAZINE (APRESOLINE) 25 MG tablet Take 1 tablet (25 mg total) by mouth 3 (three) times daily. 90 tablet 6  . hydrochlorothiazide (MICROZIDE) 12.5 MG capsule Take 12.5 mg by mouth as needed (only take when blood pressure systolic is 170 and diastolic is 100).    . insulin NPH Human (HUMULIN N,NOVOLIN N) 100 UNIT/ML injection Inject into the skin as directed. Pt takes 40 units in the morning and 55 units at bedtime    . insulin regular (NOVOLIN R) 100 units/mL injection daily before breakfast. Sliding Scale    . isosorbide mononitrate (IMDUR) 30 MG 24 hr tablet TAKE ONE-HALF TABLET BY MOUTH ONCE DAILY (Patient taking differently: TAKE ONE-HALF TABLET BY MOUTH ONCE DAILY, based on blood pressure  systolic if over 161 and distolic is 100) 45 tablet 3  . loperamide (IMODIUM) 2 MG capsule Take 2-4 mg by mouth as needed for diarrhea or loose stools.     . metoprolol succinate (TOPROL-XL) 25 MG 24 hr tablet Take 3 tablets (75 mg total) by mouth daily. 90  tablet 6  . nitroGLYCERIN (NITROSTAT) 0.4 MG SL tablet Place 1 tablet (0.4 mg total) under the tongue every 5 (five) minutes as needed for chest pain. 25 tablet 11  . Pancrelipase, Lip-Prot-Amyl, (ZENPEP) 40000-126000 units CPEP Take 1 capsule by mouth 3 (three) times daily with meals. 90 capsule 3  . pantoprazole (  PROTONIX) 40 MG tablet Take 1 tablet (40 mg total) by mouth daily. 30 tablet 6  . pravastatin (PRAVACHOL) 20 MG tablet Take 1 tablet (20 mg total) by mouth at bedtime. NEED OV. 30 tablet 2  . ranitidine (ZANTAC) 75 MG tablet Take 75 mg by mouth at bedtime.    . tamsulosin (FLOMAX) 0.4 MG CAPS capsule Take 1 capsule (0.4 mg total) by mouth daily. 30 capsule 1   No current facility-administered medications on file prior to visit.     No Known Allergies  Blood pressure (!) 158/84, pulse 78.  Standing 130/78  Essential hypertension:   Patient with essential hypertension, office readings showing a better benefit of his medications than his home readings.  He had no evidence of orthostasis today, as his standing pressure was within 10 points of the seated.  Will increase his benazepril to 10 mg bid and continue all other medications.  He is due to see endocrinology in about 2 weeks, he was given lab slip and asked to have it drawn at that time, as they will likely want blood work as well.   We will see him back in [facility] in about a month.    Phillips Hay PharmD CPP [facility] [address] [place] [address] 06/15/2018 11:13 AM

## 2018-06-15 NOTE — Patient Instructions (Signed)
Return for a a follow up appointment in 1 month.  Call if you have any concerns [phone] ([name]/Raquel)  Get your labs drawn in 2 weeks - can do at your endocrinology appointment  Your blood pressure today is 158/84   Check your blood pressure at home daily and keep record of the readings.  Take your BP meds as follows:  Increase benazepril to 1 tablet twice daily (am and supper)  Continue with all other medications  Bring all of your meds, your BP cuff and your record of home blood pressures to your next appointment.  Exercise as you're able, try to walk approximately 30 minutes per day.  Keep salt intake to a minimum, especially watch canned and prepared boxed foods.  Eat more fresh fruits and vegetables and fewer canned items.  Avoid eating in fast food restaurants.    HOW TO TAKE YOUR BLOOD PRESSURE: . Rest 5 minutes before taking your blood pressure. .  Don't smoke or drink caffeinated beverages for at least 30 minutes before. . Take your blood pressure before (not after) you eat. . Sit comfortably with your back supported and both feet on the floor (don't cross your legs). . Elevate your arm to heart level on a table or a desk. . Use the proper sized cuff. It should fit smoothly and snugly around your bare upper arm. There should be enough room to slip a fingertip under the cuff. The bottom edge of the cuff should be 1 inch above the crease of the elbow. . Ideally, take 3 measurements at one sitting and record the average.

## 2018-06-15 NOTE — Assessment & Plan Note (Signed)
Patient with essential hypertension, office readings showing a better benefit of his medications than his home readings.  He had no evidence of orthostasis today, as his standing pressure was within 10 points of the seated.  Will increase his benazepril to 10 mg bid and continue all other medications.  He is due to see endocrinology in about 2 weeks, he was given lab slip and asked to have it drawn at that time, as they will likely want blood work as well.   We will see him back in [facility] in about a month.

## 2018-06-26 ENCOUNTER — Other Ambulatory Visit: Payer: Self-pay | Admitting: Gastroenterology

## 2018-07-04 ENCOUNTER — Other Ambulatory Visit: Payer: Self-pay | Admitting: Cardiovascular Disease

## 2018-07-21 ENCOUNTER — Encounter: Payer: Self-pay | Admitting: Adult Health

## 2018-07-21 ENCOUNTER — Ambulatory Visit: Payer: Medicare HMO | Admitting: Pharmacist

## 2018-07-21 ENCOUNTER — Ambulatory Visit: Payer: Medicare HMO | Admitting: Adult Health

## 2018-07-21 VITALS — BP 148/86 | HR 74

## 2018-07-21 VITALS — BP 134/79 | HR 79 | Wt 237.2 lb

## 2018-07-21 DIAGNOSIS — I6302 Cerebral infarction due to thrombosis of basilar artery: Secondary | ICD-10-CM | POA: Diagnosis not present

## 2018-07-21 DIAGNOSIS — E7800 Pure hypercholesterolemia, unspecified: Secondary | ICD-10-CM

## 2018-07-21 DIAGNOSIS — R441 Visual hallucinations: Secondary | ICD-10-CM

## 2018-07-21 DIAGNOSIS — I10 Essential (primary) hypertension: Secondary | ICD-10-CM

## 2018-07-21 DIAGNOSIS — E1159 Type 2 diabetes mellitus with other circulatory complications: Secondary | ICD-10-CM | POA: Diagnosis not present

## 2018-07-21 MED ORDER — BENAZEPRIL HCL 10 MG PO TABS: ORAL_TABLET | ORAL | 1 refills | Status: DC

## 2018-07-21 MED ORDER — ASPIRIN 81 MG PO TABS
81.0000 mg | ORAL_TABLET | Freq: Every day | ORAL | 0 refills | Status: DC
Start: 2018-07-21 — End: 2021-11-21

## 2018-07-21 NOTE — Progress Notes (Signed)
STROKE NEUROLOGY FOLLOW UP NOTE  NAME: Johnathan Keith DOB: [birth year]  REASON FOR VISIT: stroke follow up HISTORY FROM: pt and chart  Today we had the pleasure of seeing Johnathan Keith in follow-up at our Neurology [facility]. Pt was accompanied by no one.   Chief Complaint  Patient presents with  . Follow-up    CV[facility] follow up room 9 pt alone     History Summary Johnathan Keith presents today as clinic follow up of stroke and right carotid stenosis > 70% in 2014. He had left hemiparesis in 2014 but CT and MRI did not show acute stroke, however, by my read, it is questionable a small DWI change at right pons without corresponding ADC attenuation. He has multiple stroke risk factors including HTN, DM, HLD, and right carotid stenosis.  He is on ASA and lipitor now for stroke prevention.  08/04/14 follow up - During the interval time, the patient has been doing fine without changes. He has seen his PCP and made changes for his DM regimen since his A1c was very high.    His right carotid stenosis > 70% a year ago without intervention, at that time his ICA/CCA ratio was 3.19, as compared to his left ICA/CCA ratio 1.02.  During the interval, we repeated CUS which showed right proximal/mid ICA >70% stenosis, the right ICA/CCA ratio 7.06 and left ICA/CCA ratio 0.95. The right ICA stenosis progressed from one year ago. He also complains that he has finger and toes tingling numbness as well as distal extremities cold with purple color from time to time.   11/09/14 follow up - he has followed up with Dr. [name] and had right CEA done on 09/26/14. Since then, he has intermittent chocking on eating solid food or drinking liquid. He is going to see GI next week. He also followed up with cardiology for low BP and his lisinopril decreased from 40mg  daily to 20mg  daily. He stated that his latest A1C check was 9.9 down from previous 12.3. His BP was well controlled, today in clinic 121/72.  He stated that he has  migraine hx and could happen once a week, if not taking medications it may last 2 days. But if takes Excedrin early in the course, it may last only 1hr after lying down and sleep. He continued to complains of tingling bilateral fingertips and toes. Denies smoking.    03/13/15 follow up - he was doing well. He stated that he can not stand too long otherwise he will have LBP, leg pain and neck pain and he has to lie down. He still has leg numbness and tingling. His sugar not in good control and around 200s due to insurance not covering new generation of DM meds which keeps his glucose at 150s. Now he has to go back to metformin and insulin. His BP better controlled, today 133/75. His right neck wound healed well and he is going to see Dr. [name] in 3 weeks and will do CUS again there. His swallow much better and only occasional chock with certain food.   09/12/15 follow up - he is doing the same. He had EMG/NCS with Dr. 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 The presence of active denervation at left thoracic paraspinals could also indicate more widespread central nervous system degenerative process, differentiation diagnosis also includes diabetic thoracic radiculopathies, nutritional deficiencies, inflammatory process. There was evidence of severe bilateral carpal tunnel  syndromes." MRI lumbar and cervical spine done showed cervical radiculopathy but lack of lumbosacral radiculopathy. Recommended PT and prescribed gabapentin to him. He stated that gabapentin helped him with pain but not get the numbness tingling away. He had right shoulder injections for shoulder pain and currently following with ortho to get knee injections too. He is yet to see ortho for carpel tunnel syndrome.   03/14/16 follow up - he is doing the somewhat better. No recurrent stroke like symptoms but still  has LBP and sexual dysfunction. He follows with ortho for CTS and scheduled later for left CTS surgery. Completed PT/OT and now at [facility] for self exercise. Has ? Macular degeneration, following with ophthal for retinal injection. BP 160/94.  12/18/16 follow up - pt has been doing well from stroke standpoint. Followed with cardiology and [facility] for carotid stenosis, repeat CUS in 10/2016 showed 1-39% bilaterally. Started in 11/2016 to have HA, at right back of head, with right side neck pain, tightness and dizziness feeling. No fall. Initially more painful, over time, it stabilized and improved but has not resolved. On DAPT and lipitor. BP still high at 160/85.  04/20/18 follow up JX: Patient was [facility] to [facility]/16/2018 due to worsening left-sided weakness for 2 to 3 days.  He was able to ambulate independently with mild residual weakness in the left leg since stroke in 2014.  However, in early April, it was worsening left-sided weakness, he fell to the floor due to weakness, and had to hold onto his wife while walking, eventually had use walker, and had to pull his left leg in order to get into the car.  CT head showed new lacunar infarct in the right CR, probably subacute.  MRI confirmed acute/subacute 1.5 cm right CR infarct.  MRA, carotid Doppler, TTE unremarkable.  EF 55 to 60% A1c 9.9 and LDL 55.4.  He was discharged with aspirin, Plavix and statin.  PT/OT recommend rolling walker.  Since discharge, patient has been doing stable, still walking with walker.  Glucose better controlled, however still fluctuates.   Interval history 07/21/18: Patient is being seen today for follow-up visit.  He continues to use rolling walker due to continue left hemiparesis but this has been improving.  He continues to take both aspirin and Plavix without side effects of bleeding or bruising.  Continues to take pravastatin without side effects myalgias and this is managed by cardiologist.  He continues to check  glucose levels at home and states they have becoming more stable and has recently followed with endocrinologist for management.  Blood pressure today satisfactory 134/79.  Patient does state that he has had worsening memory since his stroke where he has a difficult time remembering dates and numbers.  He also has complaints of visual hallucinations but he is aware that what he is seeing is not actually there.  He states he sees actual people, cartoon characters or "monsters".  He did experience this after his previous stroke which lasts approximately 5 months and then resolved but is now occurring again since his most recent stroke.  This occurs approximately every week to every other week and typically occurs upon awakening from sleep or nap and states he will get a quick glimpse of his hallucination and then it resolves.  Patient does have a history of OSA for which she uses CPAP but at times he is unable to tolerate the mask and will remove it.  These hallucinations do not correlate with noncompliance of mask.  Denies any headache in relation to his hallucinations.  He is fatigued during the day where he naps easily but states he does not sleep well at night and will sleep for approximately 4 hours.  He is concerned about memory loss due to family history of dementia.  AFT 15, recall 3/3 and unable to do serial additions.  He does continue to participate in physical therapy for continued left hemiparesis.  Denies new or worsening stroke/TIA symptoms.      REVIEW OF SYSTEMS: Full 14 system review of systems performed and notable only for those listed below and in HPI above, all others are negative:  Joint pain and back pain  The following represents the patient's updated allergies and side effects list: No Known Allergies  The neurologically relevant items on the patient's problem list were reviewed on today's visit.  Neurologic Examination  A problem focused neurological exam (12 or more points of  the single system neurologic examination, vital signs counts as 1 point, cranial nerves count for 8 points) was performed.  Blood pressure 134/79, pulse 79, weight 237 lb 3.2 oz (107.6 kg).  General - Well nourished, well developed, in no apparent distress.  Ophthalmologic - Sharp disc margins OU.  Cardiovascular - Regular rate and rhythm with no murmur.   Neck - supple, no nuchal rigidity. Carotid no bruits.  Mental Status -  Level of arousal and orientation to time, place, and person were intact.  Language including expression, naming, repetition, comprehension was assessed and found intact.  Attention span and concentration were normal.  Recent and remote memory were intact.  Fund of Knowledge was assessed and was intact.  AFT 15, recall 3/3 Cranial Nerves II - XII -  II - Vision intact OU.  III, IV, VI - Extraocular movements intact.  V - Facial sensation decreased on the left.  VII - Facial movement intact bilaterally.  VIII - Hearing & vestibular intact bilaterally.  X - Palate elevates symmetrically.  XI - Chin turning & shoulder shrug intact bilaterally.  XII - Tongue protrusion intact.  Motor Strength - The patient's strength was normal in RUE and RLE, but 5-/5 LUE and LLE. Bulk was normal and fasciculations were absent.  Motor Tone - Muscle tone was assessed at the neck and appendages and was normal.  Reflexes - The patient's reflexes were normal in all extremities and he had no pathological reflexes.  Sensory - Light touch, temperature/pinprick were assessed and were decreased on left UE.  Coordination - The patient had normal movements in the hands and feet with no ataxia or dysmetria. Tremor was absent.  Orbits right arm over left arm Gait and Station - broad based gait, walk with walker, left mild hemiparetic gait  Images and labs:  I have personally reviewed the radiological images below and agree with the radiology interpretations.  CTA neck - Atherosclerotic  disease in the right carotid bifurcation. 15 mm above the bifurcation, focal severe stenosis measuring 75% diameter stenosis Mild atherosclerotic disease left carotid bifurcation without significant stenosis Both vertebral arteries widely patent.  CUS - 80-99% right ICA stenosis.  2D echo - - Left ventricle: The cavity size was normal. Wall thickness was increased in a pattern of mild LVH. Systolic function was normal. The estimated ejection fraction was in the range of 55% to 60%. Wall motion was normal; there were no regional wall motion abnormalities. Doppler parameters are consistent with abnormal left ventricular relaxation (grade 1 diastolic dysfunction). The E/e&' ratio is >15, suggesting  elevated LV filling pressure. - Aortic valve: Sclerosis without stenosis. There was trace to mild regurgitation. - Left atrium: LA volume/ BSA = 20.7 ml/m2. The atrium was normal in size. - Tricuspid valve: There was trivial regurgitation. - Pulmonary arteries: PA peak pressure: 24 mm Hg (S). Impressions: - LVEF 55-60%, mild LVH, normal wall motion, diastolic dysfunction, elevated LV filling pressure, normal LA size, aortic valve sclerosis with trace to mild AI.  MRI C- spine: 1. Left paramedian disc herniation superimposed on broader disc protrusion at C6-C7 causing severe left neural foraminal narrowing that could lead to left C7 nerve root compression. 2. Small right paramedian disc protrusion at C7-T1 causing mild to moderate right foraminal narrowing with no nerve root impingement noted. 3. Milder degenerative changes at C3-C4 and C5-C6 do not lead to any foraminal narrowing or nerve root impingement.  MRI L spine: This is a borderline normal age-appropriate MRI showing minimal degenerative changes at T12-L1 and L5-S1 that did not lead to any nerve root impingement.   EMG - This is an abnormal study. There is electrodiagnostic evidence of chronic neuropathic  changes involving bilateral lumbar sacral myotomes, differentiation diagnosis includes bilateral lumbosacral radiculopathy. The presence of active denervation at left thoracic paraspinals could also indicate more widespread central nervous system degenerative process, differentiation diagnosis also includes diabetic thoracic radiculopathies, nutritional deficiencies, inflammatory process. There was evidence of severe bilateral carpal tunnel syndromes.   MRI and MRA brain 03/16/2018 1.  Acute/subacute nonhemorrhagic 1.5 cm white matter infarct adjacent to the right lateral ventricle 2.  Periventricular and brainstem white matter disease is moderately advanced for age.  This likely reflect the sequelae of chronic microvascular ischemia 3.  MRA circle of Willis demonstrate no focal stenosis, aneurysm or branch vessel occlusion. 4.  Residual mucosal thickening and chronic wall thickening despite bilateral maxillary antrostomies and partial sigmoid sinus resections.  Carotid Doppler 03/17/2018 Right: Given history of right CEA, noted that duplex criteria had not been validated in the setting of prior surgery/stenting, however, there is no evidence on the duplex of developing stenosis. Left: Color duplex indicating minimal heterogeneous plaque, with no hemodynamically significant stenosis by duplex criteria in extracranial cerebrovascular circulation.  TTE 03/17/2018 1.  Dizziness technically difficult study with suboptimal views.  Definity used for endocardial resolution. 2.  There is borderline concentric left ventricular hypertrophy. 3.  Overall left ventricular systolic function is normal with an EF between 55 to 60%. 4.  Interatrial and interventricular septum intact 5.  There is trace to mild mitral regurgitation. 6.  Mild tricuspid regurgitation present  CT had 03/16/2018 New lacunar infarct in the right coronary radiata, probably subacute.  Mild chronic small vessel ischemic changes.  Component      Latest Ref Rng 07/27/2014  Cholesterol, Total     100 - 199 mg/dL 616  Triglycerides     0 - 149 mg/dL 073 (H)  HDL     >71 mg/dL 30 (L)  VLDL Cholesterol Cal     5 - 40 mg/dL 42 (H)  LDL (calc)     0 - 99 mg/dL 84  Total CHOL/HDL Ratio     0.0 - 5.0 ratio units 5.2 (H)  TSH     0.450 - 4.500 uIU/mL 2.860  Free T4     0.82 - 1.77 ng/dL 0.62  Hgb I9S MFr Bld     4.8 - 5.6 % 12.3 (H)  Est. average glucose Bld gHb Est-mCnc      306   03/16/2018 LDL  55.4, A1c 9.9  Assessment: As you may recall, he is a 57 y.o. Caucasian male with PMH of HTN, HLD, DM, CAD and right ICA stenosis followed up in clinic today for stroke and right ICA stenosis. He had left hemiparesis in 2014 but CT and MRI did not show acute stroke, however, by my read, it is questionable a small DWI change at right pons without corresponding ADC attenuation. Regardless, he has multiple stroke risk factors including HTN, DM, HLD, and right carotid stenosis. He is on ASA, plavix and lipitor now for stroke and cardiac prevention. He also has right carotid stenosis and had right CEA by Dr. [name]. Followed with [facility] and repeat CUS no significant stenosis.  Suffered another stroke on 03/16/2018, CT showed new subacute lacunar infarct in the right CR.  MRI confirmed acute/subacute 1.5 cm right CR infarct.  MRA, carotid Doppler, TTE unremarkable.  EF 55 to 60%.  A1c 9.9 and LDL 55.4.  He was discharged with aspirin, Plavix and statin.  PT/OT recommend rolling walker.   Patient is being seen today for follow-up and does continue to have left hemiparesis but this has been improving.  He does have complaints of hallucinations which he did experience after his previous stroke but did resolve.  Plan:  - continue ASA and plavix and pravastatin for stroke and cardiac prevention -referral placed for sleep consult to rule out hypnopompic hallucinations associated with narcolepsy  - check BP and glucose at home  - follow up with endocrinologist for  continued DM management -Continue to follow with cardiology as scheduled for chronic cardiac conditions -Advised to do memory exercises for memory loss complaints such as word search, word finder card games - continue outpt PT therapy - avoid falls, use walker for safety - Follow up with your primary care physician for stroke risk factor modification. Recommend maintain blood pressure goal <130/80, diabetes with hemoglobin A1c goal below 6.5% and lipids with LDL cholesterol goal below 70 mg/dL.   - follow up in 6 months or call earlier if needed  I spent more than 25 minutes of face to face time with the patient. Greater than 50% of time was spent in counseling and coordination of care. We discussed further stroke risk factor modification, continue antiplatelet, and better diabetes management.  George Hugh, AGNP-BC  [facility] [address] [place], [place] [address]  Phone [phone] Fax [phone] Note: This document was prepared with digital dictation and possible smart phrase technology. Any transcriptional errors that result from this process are unintentional.

## 2018-07-21 NOTE — Progress Notes (Signed)
Patient ID: Johnathan Keith                 DOB: 1961-06-27                      MRN: [id number]     HPI: Johnathan Keith is a 57 y.o. male patient of Dr Allyson Sabal referred to the hypertension clinic.  Johnathan Keith' past history is notable for CAD, labile DM, HTN, HLD, TIA, OSA, and PVD, s/p RCEA 2015. He has had labile HTN and at times will self-adjust his medications. Echo Sept 2017 showed normal LVF, mild LVH, grade 1 DD.  His last A1c was 6.7.  Most recently, he was admitted to [facility] on April 8 with a lacunar infarct.   He continues to use a walker, but is otherwise doing well.  Patient reports that his pressure did well for much of last year and until his stroke in April.  Since then he feels that it has been"out of control".   During last office visit we increased benazepril from 10mg  daily to 10mg  twice daily.  He continues to have dyspnea on exertion, but no problems with edema or chest pain. He feeling a little bit more sleepy too. He reports no concerns with any of his medications, and has not noted any orthostatic problems recently.  Current HTN meds:  Benazepril 10mg  twice daily metoprolol succinate 75 mg daily (lunch) Hydralazine 25 mg daily (am, supper, bedtime) Isosorbide mononotrate 30mg  daily  (every morning) Furosemide 40mg  daily and 80mg  alternating (every morning)\  Previously tried:  Lisinopril 10-40mg  daily - stopped due to labile BP   BP goal: <130/80  Family History: reports heart disease form mother and father, hypertension and diabeted from mother, father, brother, ans sister.  Social History: denies smoking, smokeless tobacco, alcohol use or other drugs; caffeine only when eating out Diet: no table salt, avoiding high potassium vegetables, mainly eating frozen vegetables and meat, caffeine freed diet drinks.  Not adding salt, but not actively avoiding pre salted itmes  Exercise: not able, states feels chest tightness even going up/down stairs; grocery shopping  wears him out, walks with cart support.  Diet:  Patient reports working hard to cut sodium out of his diet and limit ot no more than 2,000 mg per day.  Has started buying salt free vegetables and not adding salt to foods at home.   Home BP readings: 141/85 (per patient recollection), no records provided today **OMRON home cuff/device determied to be accurate within ** - last summer (2018)  Wt Readings from Last 3 Encounters:  07/21/18 237 lb 3.2 oz (107.6 kg)  05/15/18 230 lb (104.3 kg)  04/20/18 231 lb 3.2 oz (104.9 kg)   BP Readings from Last 3 Encounters:  07/21/18 134/79  07/21/18 (!) 148/86  06/15/18 (!) 158/84   Pulse Readings from Last 3 Encounters:  07/21/18 79  07/21/18 74  06/15/18 78    Past Medical History:  Diagnosis Date  . Chronic neck and back pain   . Coronary artery disease    drug-eluting stents placed in proximal and mid LAD  . Deviated septum   . Diabetic retinopathy (HCC)   . GERD (gastroesophageal reflux disease)    "occasionally" (03/23/2015)  . Headache    "more than 2/wk" (03/23/2015)  . Heart attack (HCC)    "in 1997 was told I had signs of a small heart attack that I didn't know I'd had"  . High cholesterol   .  History of gout    "when I was younger"  . Hypertension   . Kidney stones   . Migraine    "had my 1st and only in 12/2014" (03/23/2015)  . Peripheral vascular disease (HCC)    carotid artery disease  . PONV (postoperative nausea and vomiting)    "was told I was confused and combative in recovery from the narcotics w/my carotid OR"  . Sleep apnea    "severe; couldn't afford mask" (03/23/2015)  . Stroke [facility]) 2014   "on walker for 2 months; left side is weaker and numb since" (03/23/2015)  . Type II diabetes mellitus (HCC)     Current Outpatient Medications on File Prior to Visit  Medication Sig Dispense Refill  . aspirin 81 MG tablet Take 1 tablet (81 mg total) by mouth daily. 1 tablet 0  . Cholecalciferol (VITAMIN D) 2000  UNITS tablet Take 2,000 Units by mouth daily.    . clopidogrel (PLAVIX) 75 MG tablet TAKE 1 TABLET BY MOUTH ONCE DAILY 90 tablet 0  . cyclobenzaprine (FLEXERIL) 5 MG tablet Take 5 mg by mouth daily as needed for muscle spasms.     Marland Kitchen dicyclomine (BENTYL) 10 MG capsule TAKE 1 CAPSULE BY MOUTH 4 TIMES DAILY 120 capsule 0  . dicyclomine (BENTYL) 10 MG capsule dicyclomine 10 mg capsule    . furosemide (LASIX) 80 MG tablet Take 1 tablet alternating with 1/2 tablet every other day 30 tablet 12  . gabapentin (NEURONTIN) 100 MG capsule Take 1 capsule (100 mg total) by mouth 3 (three) times daily. (Patient taking differently: Take 300 mg by mouth at bedtime. ) 90 capsule 0  . glimepiride (AMARYL) 4 MG tablet Take 4 mg by mouth 2 (two) times daily.    . hydrALAZINE (APRESOLINE) 25 MG tablet Take 1 tablet (25 mg total) by mouth 3 (three) times daily. 90 tablet 6  . hydrochlorothiazide (MICROZIDE) 12.5 MG capsule Take 12.5 mg by mouth as needed (only take when blood pressure systolic is 170 and diastolic is 100).    . insulin NPH Human (HUMULIN N,NOVOLIN N) 100 UNIT/ML injection Inject into the skin as directed. Pt takes 40 units in the morning and 55 units at bedtime    . insulin regular (NOVOLIN R) 100 units/mL injection daily before breakfast. Sliding Scale    . isosorbide mononitrate (IMDUR) 30 MG 24 hr tablet TAKE ONE-HALF TABLET BY MOUTH ONCE DAILY (Patient taking differently: TAKE ONE-HALF TABLET BY MOUTH ONCE DAILY, based on blood pressure  systolic if over 098 and distolic is 100) 45 tablet 3  . loperamide (IMODIUM) 2 MG capsule Take 2-4 mg by mouth as needed for diarrhea or loose stools.     . metoprolol succinate (TOPROL-XL) 25 MG 24 hr tablet Take 3 tablets (75 mg total) by mouth daily. 90 tablet 6  . nitroGLYCERIN (NITROSTAT) 0.4 MG SL tablet Place 1 tablet (0.4 mg total) under the tongue every 5 (five) minutes as needed for chest pain. 25 tablet 11  . Pancrelipase, Lip-Prot-Amyl, (ZENPEP)  40000-126000 units CPEP Take 1 capsule by mouth 3 (three) times daily with meals. 90 capsule 3  . pantoprazole (PROTONIX) 40 MG tablet Take 1 tablet (40 mg total) by mouth daily. 30 tablet 6  . pravastatin (PRAVACHOL) 20 MG tablet Take 1 tablet (20 mg total) by mouth at bedtime. NEED OV. 30 tablet 2  . ranitidine (ZANTAC) 75 MG tablet Take 75 mg by mouth at bedtime.    . tamsulosin (FLOMAX) 0.4 MG  CAPS capsule Take 1 capsule (0.4 mg total) by mouth daily. 30 capsule 1   No current facility-administered medications on file prior to visit.     No Known Allergies  Blood pressure (!) 148/86, pulse 74.   Essential hypertension: Blood pressure improved since last office visit but remains above goal of 130/80. Patient is tolerating current therapy without problems. Will increase benazepril to 10mg  every morning and 20mg  every evening. Plan to optimized benazepril dose to 40mg  total per day but will continue slow increase due to previous episodes of hypotension. Patient to continue twice daily BP monitoring and bring records to f/u  OV in 4 weeks.  Jolanta Cabeza Rodriguez-Guzman PharmD, BCPS, CPP [facility] [address] [place] [address] 07/23/2018 4:34 PM

## 2018-07-21 NOTE — Patient Instructions (Signed)
Continue aspirin 81 mg daily and clopidogrel 75 mg daily  and pravastatin  for secondary stroke prevention  Continue to follow up with PCP regarding chronic conditions management   Continue to follow up with cardiology for cholesterol, blood pressure and other cardiac conditions   Continue physical therapy for continued left weakness   Perform memory exercises for memory loss complaints such as suduku, word search, word find or card games  Continue to monitor blood pressure at home  Maintain strict control of hypertension with blood pressure goal below 130/90, diabetes with hemoglobin A1c goal below 6.5% and cholesterol with LDL cholesterol (bad cholesterol) goal below 70 mg/dL. I also advised the patient to eat a healthy diet with plenty of whole grains, cereals, fruits and vegetables, exercise regularly and maintain ideal body weight.  Followup in the future with me in 6 months or call earlier if needed       Thank you for coming to see [place] at [facility]. I hope we have been able to provide you high quality care today.  You may receive a patient satisfaction survey over the next few weeks. We would appreciate your feedback and comments so that we may continue to improve ourselves and the health of our patients.

## 2018-07-21 NOTE — Patient Instructions (Addendum)
Return for a  follow up appointment in 4 weeks (with Dr Allyson Sabal)  Check your blood pressure at home daily (if able) and keep record of the readings.  Take your BP meds as follows: *INCREASE benazepril 10mg  every MORNING and 20mg  every EVENING*  Bring all of your meds, your BP cuff and your record of home blood pressures to your next appointment.  Exercise as you're able, try to walk approximately 30 minutes per day.  Keep salt intake to a minimum, especially watch canned and prepared boxed foods.  Eat more fresh fruits and vegetables and fewer canned items.  Avoid eating in fast food restaurants.    HOW TO TAKE YOUR BLOOD PRESSURE: . Rest 5 minutes before taking your blood pressure. .  Don't smoke or drink caffeinated beverages for at least 30 minutes before. . Take your blood pressure before (not after) you eat. . Sit comfortably with your back supported and both feet on the floor (don't cross your legs). . Elevate your arm to heart level on a table or a desk. . Use the proper sized cuff. It should fit smoothly and snugly around your bare upper arm. There should be enough room to slip a fingertip under the cuff. The bottom edge of the cuff should be 1 inch above the crease of the elbow. . Ideally, take 3 measurements at one sitting and record the average.

## 2018-07-23 ENCOUNTER — Encounter: Payer: Self-pay | Admitting: Pharmacist

## 2018-07-23 NOTE — Progress Notes (Signed)
I agree with the above plan 

## 2018-07-23 NOTE — Assessment & Plan Note (Signed)
Blood pressure improved since last office visit but remains above goal of 130/80. Patient is tolerating current therapy without problems. Will increase benazepril to 10mg  every morning and 20mg  every evening. Plan to optimized benazepril dose to 40mg  total per day but will continue slow increase due to previous episodes of hypotension. Patient to continue twice daily BP monitoring and bring records to f/u  OV in 4 weeks.

## 2018-07-28 ENCOUNTER — Telehealth: Payer: Self-pay | Admitting: Cardiovascular Disease

## 2018-07-28 NOTE — Telephone Encounter (Signed)
Called patient and LVM to call me back to schedule first available sleep clinic with Dr. Tresa Endo, which is 10-26-18.

## 2018-08-13 ENCOUNTER — Other Ambulatory Visit: Payer: Self-pay | Admitting: Gastroenterology

## 2018-08-18 ENCOUNTER — Ambulatory Visit: Payer: Medicare HMO | Admitting: Cardiovascular Disease

## 2018-08-18 ENCOUNTER — Encounter: Payer: Self-pay | Admitting: Cardiovascular Disease

## 2018-08-18 VITALS — BP 162/70 | HR 90 | Ht 65.0 in | Wt 243.4 lb

## 2018-08-18 DIAGNOSIS — R0602 Shortness of breath: Secondary | ICD-10-CM

## 2018-08-18 DIAGNOSIS — R600 Localized edema: Secondary | ICD-10-CM

## 2018-08-18 DIAGNOSIS — E7849 Other hyperlipidemia: Secondary | ICD-10-CM | POA: Diagnosis not present

## 2018-08-18 DIAGNOSIS — Z79899 Other long term (current) drug therapy: Secondary | ICD-10-CM

## 2018-08-18 HISTORY — DX: Localized edema: R60.0

## 2018-08-18 MED ORDER — FUROSEMIDE 80 MG PO TABS: ORAL_TABLET | ORAL | 12 refills | Status: DC

## 2018-08-18 MED ORDER — PRAVASTATIN SODIUM 20 MG PO TABS: 20.0000 mg | ORAL_TABLET | Freq: Every day | ORAL | 3 refills | Status: DC

## 2018-08-18 MED ORDER — NITROGLYCERIN 0.4 MG SL SUBL: 0.4000 mg | SUBLINGUAL_TABLET | SUBLINGUAL | 3 refills | Status: DC | PRN

## 2018-08-18 MED ORDER — BENAZEPRIL HCL 10 MG PO TABS: ORAL_TABLET | ORAL | 3 refills | Status: DC

## 2018-08-18 NOTE — Assessment & Plan Note (Signed)
He does have 1-2+ pitting edema.  I am going to increase his Lasix to 80 mg a day, and will check a basic metabolic panel in 7 to 10 days.  He will see Baxter Hire back in 1 month for follow-up of lab work.

## 2018-08-18 NOTE — Progress Notes (Signed)
08/18/2018 Johnathan Keith   1960-12-21  [id number]  Primary Physician Sistasis, Rosanne Ashing, MD Primary Cardiologist: Runell Gess MD FACP, [place], [place], [place]  HPI:  Johnathan Keith is a 57 y.o.  mildly overweight married Caucasian male who I last saw in the office  05/15/2018.  In addition to hypertension, his cardiovascular history is significant for prior TIA subsequent to carotid artery disease. He underwent right carotid endarterectomy performed by Dr. [name] on 09/26/2014. There is also question of remote myocardial infarction in the past. He recently underwent evaluation with a 2-D echocardiogram that was performed preoperatively which showed normal LV function as well as a Myoview stress test that had subtle inferior and lateral ischemia. At that time he had no SOB or chest pain and was cleared for surgery. His history is also notable for hyperlipidemia and diabetes. He was last seen by Dr. Allyson Sabal 10/26/2014. During that time, he complained of symptomatic hypotension with systolic blood pressures in the low 90s. Subsequently Dr. Allyson Sabal decided to decrease his lisinopril down from 40 mg to 20 mg daily and now down to 10 mg daily. He is taking a forth of 40 mg table. Since that time he has been seen by our office pharmacist Phillips Hay for labile blood pressures. Though over all BP is improved. He is on most of his meds.   He has been on lasix 80 mg daily for a year for lower ext. Edema. His K+ has been elevated at times and currently he is off the kdur.His most recent lipid profile in our chart for/13/16 revealed total cholesterol 132, LDL 54 and HDL of 34.  In addition to lower ext edema he has DOE. No chest pain but walking in grocery store and to car he is very SOB. This has progressed since Nuc study. He has several risk factors: DM, hyperlipidemia, vascular disease and family hx CAD. His nuclear study was high risk for anterolateral and inferior ischemia. Based on this, and in  light of his increasing dyspnea on exertion as a potential anginal equivalent, he underwent diagnostic coronary arteriography by myself 03/23/15 revealing proximal and mid LAD stenoses which I intervened on with drug-eluting stents. The circumflex and RCA were free of significant disease as LV function was preserved. Since intervention the symptoms have markedly improved.   since I saw him a year ago he's remained clinically stable. He underwent nasal surgery 06/04/16. He saw Corine Shelter in the office 08/02/16 was normal as well. He continues to complain of some dyspnea on exertion however.  Since I saw him a year ago he has noticed some recent weight gain thought to be related to fluid accumulation as well as dyspnea with associated chest tightness over the last 6 months. His diuretics were recently adjusted.   Since I saw him in the office approximately a month ago I did obtain a 2-D echocardiogram on 09/05/17 as well as a Myoview stress test on 09/09/17 both of which were normal. Hecontinuedto have symptoms of dyspnea which he says are similar to his preintervention symptoms.Because of this I elected to perform outpatient diagnostic cardiac catheterization on 09/29/17 via right radial approach revealing normal LV function with a normal LVEDP, patent stents with 80% mid LAD lesion just beyond the distal stent which I restented using a 2.25 x 12 mm long synergy drug-eluting stent postdilated to 2.43 mm. The symptoms of chest pain have resolved and remained dyspneic.  Since I saw him in the office back in October, a  week after his LAD stent when his symptoms had improved he has had a recurrent stroke in April of this year characterized by disequilibrium.  He was seen at [facility].  Apparently had he had a corona radiata lacunar stroke.  His blood pressure has been difficult to control.  He was told that he had paroxysmal atrial fibrillation as well as a "hole in his heart by 2D echocardiogram and  bubble study.  He has seen Dr. Roda Shutters, his neurologist, in the office for further evaluation and treatment  Since I saw him 3 months ago continues to have lower extremity edema left greater than right.  He he does take 80 mg of Lasix for 3 days in a row followed by 40 mg a day which will increase.  His blood pressure log does also show that his blood pressure is poorly controlled   Current Meds  Medication Sig  . albuterol (PROVENTIL HFA;VENTOLIN HFA) 108 (90 Base) MCG/ACT inhaler Inhale 1 puff into the lungs every 6 (six) hours as needed for wheezing or shortness of breath.  Marland Kitchen aspirin 81 MG tablet Take 1 tablet (81 mg total) by mouth daily.  Marland Kitchen azelastine (ASTELIN) 0.1 % nasal spray Place 1 spray into both nostrils as directed. Use in each nostril as directed  . benazepril (LOTENSIN) 10 MG tablet Take 1 tablet (10mg ) every morning and 2 tablets (20mg ) every evening  . cefdinir (OMNICEF) 300 MG capsule Take 1 capsule by mouth 2 (two) times daily.  . Cholecalciferol (VITAMIN D) 2000 UNITS tablet Take 2,000 Units by mouth daily.  . clopidogrel (PLAVIX) 75 MG tablet TAKE 1 TABLET BY MOUTH ONCE DAILY  . cyclobenzaprine (FLEXERIL) 5 MG tablet Take 5 mg by mouth daily as needed for muscle spasms.   Marland Kitchen dicyclomine (BENTYL) 10 MG capsule Take 10 mg by mouth 4 (four) times daily.   . furosemide (LASIX) 80 MG tablet Take 1 tablet alternating with 1/2 tablet every other day  . gabapentin (NEURONTIN) 100 MG capsule Take 1 capsule (100 mg total) by mouth 3 (three) times daily. (Patient taking differently: Take 300 mg by mouth at bedtime. )  . glimepiride (AMARYL) 4 MG tablet Take 4 mg by mouth 2 (two) times daily.  . hydrALAZINE (APRESOLINE) 25 MG tablet Take 1 tablet (25 mg total) by mouth 3 (three) times daily.  . hydrochlorothiazide (MICROZIDE) 12.5 MG capsule Take 12.5 mg by mouth as needed (only take when blood pressure systolic is 170 and diastolic is 100).  . insulin NPH Human (HUMULIN N,NOVOLIN N) 100  UNIT/ML injection Inject into the skin as directed. Pt takes 40 units in the morning and 55 units at bedtime  . insulin regular (NOVOLIN R) 100 units/mL injection daily before breakfast. Sliding Scale  . loperamide (IMODIUM) 2 MG capsule Take 2-4 mg by mouth as needed for diarrhea or loose stools.   . metoprolol succinate (TOPROL-XL) 25 MG 24 hr tablet Take 3 tablets (75 mg total) by mouth daily.  . nitroGLYCERIN (NITROSTAT) 0.4 MG SL tablet Place 1 tablet (0.4 mg total) under the tongue every 5 (five) minutes as needed for chest pain.  . Pancrelipase, Lip-Prot-Amyl, (ZENPEP) 40000-126000 units CPEP Take 1 capsule by mouth 3 (three) times daily with meals.  . pantoprazole (PROTONIX) 40 MG tablet Take 1 tablet (40 mg total) by mouth daily.  . pravastatin (PRAVACHOL) 20 MG tablet Take 1 tablet (20 mg total) by mouth at bedtime. NEED OV.  . ranitidine (ZANTAC) 75 MG tablet Take 75  mg by mouth at bedtime.  . tamsulosin (FLOMAX) 0.4 MG CAPS capsule Take 1 capsule (0.4 mg total) by mouth daily.     No Known Allergies  Social History   Socioeconomic History  . Marital status: Married    Spouse name: Not on file  . Number of children: 5  . Years of education: 12th  . Highest education level: Not on file  Occupational History  . Occupation: Disabled  Social Needs  . Financial resource strain: Not on file  . Food insecurity:    Worry: Not on file    Inability: Not on file  . Transportation needs:    Medical: Not on file    Non-medical: Not on file  Tobacco Use  . Smoking status: Never Smoker  . Smokeless tobacco: Never Used  Substance and Sexual Activity  . Alcohol use: No    Alcohol/week: 0.0 standard drinks  . Drug use: No  . Sexual activity: Not Currently    Partners: Female  Lifestyle  . Physical activity:    Days per week: Not on file    Minutes per session: Not on file  . Stress: Not on file  Relationships  . Social connections:    Talks on phone: Not on file    Gets  together: Not on file    Attends religious service: Not on file    Active member of club or organization: Not on file    Attends meetings of clubs or organizations: Not on file    Relationship status: Not on file  . Intimate partner violence:    Fear of current or ex partner: Not on file    Emotionally abused: Not on file    Physically abused: Not on file    Forced sexual activity: Not on file  Other Topics Concern  . Not on file  Social History Narrative   Patient is married with 5 children.    Patient is right handed.   Patient has hs education.   Patient drinks 4 12 oz can sodas daily.     Review of Systems: General: negative for chills, fever, night sweats or weight changes.  Cardiovascular: negative for chest pain, dyspnea on exertion, edema, orthopnea, palpitations, paroxysmal nocturnal dyspnea or shortness of breath Dermatological: negative for rash Respiratory: negative for cough or wheezing Urologic: negative for hematuria Abdominal: negative for nausea, vomiting, diarrhea, bright red blood per rectum, melena, or hematemesis Neurologic: negative for visual changes, syncope, or dizziness All other systems reviewed and are otherwise negative except as noted above.    Blood pressure (!) 162/70, pulse 90, height 5\' 5"  (1.651 m), weight 243 lb 6.4 oz (110.4 kg).  General appearance: alert and no distress Neck: no adenopathy, no carotid bruit, no JVD, supple, symmetrical, trachea midline and thyroid not enlarged, symmetric, no tenderness/mass/nodules Lungs: clear to auscultation bilaterally Heart: regular rate and rhythm, S1, S2 normal, no murmur, click, rub or gallop Extremities: 2+ pitting edema bilaterally Pulses: 2+ and symmetric Skin: Skin color, texture, turgor normal. No rashes or lesions Neurologic: Alert and oriented X 3, normal strength and tone. Normal symmetric reflexes. Normal coordination and gait  EKG sinus rhythm at 90 without ST or T wave changes.   Personally reviewed this EKG.  ASSESSMENT AND PLAN:   Bilateral lower extremity edema He does have 1-2+ pitting edema.  I am going to increase his Lasix to 80 mg a day, and will check a basic metabolic panel in 7 to 10 days.  He will  see Kristen back in 1 month for follow-up of lab work.  Hyperlipidemia History of hyperlipidemia on pravastatin with lipid profile performed by his PCP 03/17/2018 revealing total cholesterol 126, LDL 55 HDL 24.  Essential hypertension History of essential hypertension her blood pressure measured at 162/70.  He is on benazepril 10 the morning and 20 in the evening as well as hydralazine, hydrochlorothiazide, and metoprolol.  Blood pressures have been on the high side.  I am going to increase his benazepril to 20 mg p.o. twice daily and have asked him to keep a blood pressure log which will be reviewed by Baxter Hire in 1 month.      Runell Gess MD FACP,FACC,FAHA, [facility] 08/18/2018 10:29 AM

## 2018-08-18 NOTE — Assessment & Plan Note (Signed)
History of essential hypertension her blood pressure measured at 162/70.  He is on benazepril 10 the morning and 20 in the evening as well as hydralazine, hydrochlorothiazide, and metoprolol.  Blood pressures have been on the high side.  I am going to increase his benazepril to 20 mg p.o. twice daily and have asked him to keep a blood pressure log which will be reviewed by Baxter Hire in 1 month.

## 2018-08-18 NOTE — Patient Instructions (Signed)
Medication Instructions:  Your physician has recommended you make the following change in your medication:  1) INCREASE Lasix to 80 mg tablet by mouth ONCE daily 2) INCREASE Benazepril to 20mg  (2 tablets) by mouth TWICE daily  Labwork: Your physician recommends that you return for lab work in: 10 days - Sept 20th   Testing/Procedures: none  Follow-Up: Your physician recommends that you schedule a follow-up appointment in: 1 month with Hypertension Clinic   Dr. Allyson Sabal would like you to check your blood pressure DAILY for the next 4 weeks.  Keep a journal of these daily BP and heart rate reading and call our office with the results.   We request that you follow-up in: 3 months with an extender and in 6 months with Dr San Morelle will receive a reminder letter in the mail two months in advance. If you don't receive a letter, please call our office to schedule the follow-up appointment.    Any Other Special Instructions Will Be Listed Below (If Applicable).     If you need a refill on your cardiac medications before your next appointment, please call your pharmacy.

## 2018-08-18 NOTE — Assessment & Plan Note (Signed)
History of hyperlipidemia on pravastatin with lipid profile performed by his PCP 03/17/2018 revealing total cholesterol 126, LDL 55 HDL 24.

## 2018-08-20 NOTE — Addendum Note (Signed)
Addended by: Chana Bode on: 08/20/2018 02:07 PM   Modules accepted: Orders

## 2018-09-04 ENCOUNTER — Telehealth: Payer: Self-pay | Admitting: Cardiovascular Disease

## 2018-09-04 NOTE — Telephone Encounter (Addendum)
° °  STAT if HR is under 50 or over 120 (normal HR is 60-100 beats per minute)  1) What is your heart rate? HR 108 this morning . BP 123/79  2) Do you have a log of your heart rate readings (document readings)? 132-->145 last night  3) Do you have any other symptoms? Increased HR, SOB  Patient states last night he had episode of increased HR. Patient wants to know what medication he needs to take when HR is elevated.

## 2018-09-04 NOTE — Telephone Encounter (Signed)
Called, LVM to discuss medication changes.

## 2018-09-04 NOTE — Telephone Encounter (Signed)
Please advise,  Patient called stated that yesterday he woke up at around 11:20 feeling palpitations, so he checked his BP and it was 170/105 and then rechecked at 11:40 and it was 146/91 HR 132, and then he just checked his HR at 12:20 and it was 145. Patient states that he was very SOB yesterday, although he does not have excessive SOB today, denies chest pains, does mention having swelling in legs/feet and Dr.Berry increased lasix to 80 mg daily on 08/18/18, but he states that the swelling is still an issue. He would like to know if any medication changes should be made, he was just seen on 08/18/18, and has an appointment with PharmD on 09/17/18.   Will route to PharmD and Dr.Berry.

## 2018-09-04 NOTE — Telephone Encounter (Signed)
Called patient, advised of note from PharmD.  Patient also advised to do deep breathing when HR gets higher, and to take rest periods.   Patient verbalized understanding.

## 2018-09-04 NOTE — Telephone Encounter (Signed)
Have him take an extra 25 mg of hydralazine no more than once daily for systolic BP > 170.  Otherwise no change and keep appointment in Oct with [facility]

## 2018-09-07 LAB — BASIC METABOLIC PANEL
BUN/Creatinine Ratio: 19 (ref 9–20)
BUN: 22 mg/dL (ref 6–24)
CO2: 29 mmol/L (ref 20–29)
Calcium: 9.6 mg/dL (ref 8.7–10.2)
Chloride: 100 mmol/L (ref 96–106)
Creatinine, Ser: 1.17 mg/dL (ref 0.76–1.27)
GFR calc Af Amer: 78 mL/min/{1.73_m2} (ref >59)
GFR calc non Af Amer: 68 mL/min/{1.73_m2} (ref >59)
Glucose: 288 mg/dL — ABNORMAL HIGH (ref 65–99)
Potassium: 5 mmol/L (ref 3.5–5.2)
Sodium: 144 mmol/L (ref 134–144)

## 2018-09-08 DIAGNOSIS — E669 Obesity, unspecified: Secondary | ICD-10-CM

## 2018-09-08 DIAGNOSIS — E119 Type 2 diabetes mellitus without complications: Secondary | ICD-10-CM

## 2018-09-08 DIAGNOSIS — I10 Essential (primary) hypertension: Secondary | ICD-10-CM

## 2018-09-08 DIAGNOSIS — I679 Cerebrovascular disease, unspecified: Secondary | ICD-10-CM

## 2018-09-08 DIAGNOSIS — R0902 Hypoxemia: Secondary | ICD-10-CM

## 2018-09-08 DIAGNOSIS — G4733 Obstructive sleep apnea (adult) (pediatric): Secondary | ICD-10-CM | POA: Diagnosis not present

## 2018-09-08 DIAGNOSIS — I2510 Atherosclerotic heart disease of native coronary artery without angina pectoris: Secondary | ICD-10-CM

## 2018-09-08 DIAGNOSIS — J189 Pneumonia, unspecified organism: Secondary | ICD-10-CM

## 2018-09-09 DIAGNOSIS — R0902 Hypoxemia: Secondary | ICD-10-CM | POA: Diagnosis not present

## 2018-09-09 DIAGNOSIS — G4733 Obstructive sleep apnea (adult) (pediatric): Secondary | ICD-10-CM | POA: Diagnosis not present

## 2018-09-09 DIAGNOSIS — I679 Cerebrovascular disease, unspecified: Secondary | ICD-10-CM | POA: Diagnosis not present

## 2018-09-09 DIAGNOSIS — J189 Pneumonia, unspecified organism: Secondary | ICD-10-CM | POA: Diagnosis not present

## 2018-09-10 DIAGNOSIS — R0902 Hypoxemia: Secondary | ICD-10-CM | POA: Diagnosis not present

## 2018-09-10 DIAGNOSIS — G4733 Obstructive sleep apnea (adult) (pediatric): Secondary | ICD-10-CM | POA: Diagnosis not present

## 2018-09-10 DIAGNOSIS — I679 Cerebrovascular disease, unspecified: Secondary | ICD-10-CM | POA: Diagnosis not present

## 2018-09-10 DIAGNOSIS — J189 Pneumonia, unspecified organism: Secondary | ICD-10-CM | POA: Diagnosis not present

## 2018-09-11 DIAGNOSIS — J189 Pneumonia, unspecified organism: Secondary | ICD-10-CM | POA: Diagnosis not present

## 2018-09-11 DIAGNOSIS — I679 Cerebrovascular disease, unspecified: Secondary | ICD-10-CM | POA: Diagnosis not present

## 2018-09-11 DIAGNOSIS — G4733 Obstructive sleep apnea (adult) (pediatric): Secondary | ICD-10-CM | POA: Diagnosis not present

## 2018-09-11 DIAGNOSIS — R0902 Hypoxemia: Secondary | ICD-10-CM | POA: Diagnosis not present

## 2018-09-17 ENCOUNTER — Ambulatory Visit: Payer: Medicare HMO

## 2018-09-17 NOTE — Progress Notes (Deleted)
Patient ID: Johnathan Keith                 DOB: 02-26-1961                      MRN: [id number]     HPI: Johnathan Keith is a 57 y.o. male patient of Dr Allyson Sabal referred to the hypertension clinic.  Johnathan Keith' past history is notable for CAD, labile DM, HTN, HLD, TIA, OSA, and PVD, s/p RCEA 2015. He has had labile HTN and at times will self-adjust his medications. Echo Sept 2017 showed normal LVF, mild LVH, grade 1 DD.  His last A1c was 6.7.  Most recently, he was admitted to [facility] on April 8 with a lacunar infarct.   He continues to use a walker, but is otherwise doing well.  Patient reports that his pressure did well for much of last year and until his stroke in April.  Since then he feels that it has been"out of control".   During last office visit we increased benazepril from 10mg  daily to 10mg  twice daily.  He continues to have dyspnea on exertion, but no problems with edema or chest pain. He feeling a little bit more sleepy too. He reports no concerns with any of his medications, and has not noted any orthostatic problems recently.  Current HTN meds:  Benazepril 10mg  twice daily metoprolol succinate 75 mg daily (lunch) Hydralazine 25 mg daily (am, supper, bedtime) Isosorbide mononotrate 30mg  daily  (every morning) Furosemide 40mg  daily and 80mg  alternating (every morning)\  Previously tried:  Lisinopril 10-40mg  daily - stopped due to labile BP   BP goal: <130/80  Family History: reports heart disease form mother and father, hypertension and diabeted from mother, father, brother, ans sister.  Social History: denies smoking, smokeless tobacco, alcohol use or other drugs; caffeine only when eating out Diet: no table salt, avoiding high potassium vegetables, mainly eating frozen vegetables and meat, caffeine freed diet drinks.  Not adding salt, but not actively avoiding pre salted itmes  Exercise: not able, states feels chest tightness even going up/down stairs; grocery shopping  wears him out, walks with cart support.  Diet:  Patient reports working hard to cut sodium out of his diet and limit ot no more than 2,000 mg per day.  Has started buying salt free vegetables and not adding salt to foods at home.   Home BP readings: 141/85 (per patient recollection), no records provided today **OMRON home cuff/device determied to be accurate within ** - last summer (2018)  Wt Readings from Last 3 Encounters:  08/18/18 243 lb 6.4 oz (110.4 kg)  07/21/18 237 lb 3.2 oz (107.6 kg)  05/15/18 230 lb (104.3 kg)   BP Readings from Last 3 Encounters:  08/18/18 (!) 162/70  07/21/18 134/79  07/21/18 (!) 148/86   Pulse Readings from Last 3 Encounters:  08/18/18 90  07/21/18 79  07/21/18 74    Past Medical History:  Diagnosis Date  . Chronic neck and back pain   . Coronary artery disease    drug-eluting stents placed in proximal and mid LAD  . Deviated septum   . Diabetic retinopathy (HCC)   . GERD (gastroesophageal reflux disease)    "occasionally" (03/23/2015)  . Headache    "more than 2/wk" (03/23/2015)  . Heart attack (HCC)    "in 1997 was told I had signs of a small heart attack that I didn't know I'd had"  . High cholesterol   .  History of gout    "when I was younger"  . Hypertension   . Kidney stones   . Migraine    "had my 1st and only in 12/2014" (03/23/2015)  . Peripheral vascular disease (HCC)    carotid artery disease  . PONV (postoperative nausea and vomiting)    "was told I was confused and combative in recovery from the narcotics w/my carotid OR"  . Sleep apnea    "severe; couldn't afford mask" (03/23/2015)  . Stroke [facility]) 2014   "on walker for 2 months; left side is weaker and numb since" (03/23/2015)  . Type II diabetes mellitus (HCC)     Current Outpatient Medications on File Prior to Visit  Medication Sig Dispense Refill  . albuterol (PROVENTIL HFA;VENTOLIN HFA) 108 (90 Base) MCG/ACT inhaler Inhale 1 puff into the lungs every 6 (six)  hours as needed for wheezing or shortness of breath.    Marland Kitchen aspirin 81 MG tablet Take 1 tablet (81 mg total) by mouth daily. 1 tablet 0  . azelastine (ASTELIN) 0.1 % nasal spray Place 1 spray into both nostrils as directed. Use in each nostril as directed    . benazepril (LOTENSIN) 10 MG tablet TAKE 2 tablets by mouth TWICE daily 180 tablet 3  . cefdinir (OMNICEF) 300 MG capsule Take 1 capsule by mouth 2 (two) times daily.    . Cholecalciferol (VITAMIN D) 2000 UNITS tablet Take 2,000 Units by mouth daily.    . clopidogrel (PLAVIX) 75 MG tablet TAKE 1 TABLET BY MOUTH ONCE DAILY 90 tablet 0  . cyclobenzaprine (FLEXERIL) 5 MG tablet Take 5 mg by mouth daily as needed for muscle spasms.     Marland Kitchen dicyclomine (BENTYL) 10 MG capsule Take 10 mg by mouth 4 (four) times daily.     . furosemide (LASIX) 80 MG tablet Take 1 tablet BY MOUTH DAILY 30 tablet 12  . gabapentin (NEURONTIN) 100 MG capsule Take 1 capsule (100 mg total) by mouth 3 (three) times daily. (Patient taking differently: Take 300 mg by mouth at bedtime. ) 90 capsule 0  . glimepiride (AMARYL) 4 MG tablet Take 4 mg by mouth 2 (two) times daily.    . hydrALAZINE (APRESOLINE) 25 MG tablet Take 1 tablet (25 mg total) by mouth 3 (three) times daily. 90 tablet 6  . hydrochlorothiazide (MICROZIDE) 12.5 MG capsule Take 12.5 mg by mouth as needed (only take when blood pressure systolic is 170 and diastolic is 100).    . insulin NPH Human (HUMULIN N,NOVOLIN N) 100 UNIT/ML injection Inject into the skin as directed. Pt takes 40 units in the morning and 55 units at bedtime    . insulin regular (NOVOLIN R) 100 units/mL injection daily before breakfast. Sliding Scale    . loperamide (IMODIUM) 2 MG capsule Take 2-4 mg by mouth as needed for diarrhea or loose stools.     . metoprolol succinate (TOPROL-XL) 25 MG 24 hr tablet Take 3 tablets (75 mg total) by mouth daily. 90 tablet 6  . nitroGLYCERIN (NITROSTAT) 0.4 MG SL tablet Place 1 tablet (0.4 mg total) under the  tongue every 5 (five) minutes as needed for chest pain. 25 tablet 3  . Pancrelipase, Lip-Prot-Amyl, (ZENPEP) 40000-126000 units CPEP Take 1 capsule by mouth 3 (three) times daily with meals. 90 capsule 3  . pantoprazole (PROTONIX) 40 MG tablet Take 1 tablet (40 mg total) by mouth daily. 30 tablet 6  . pravastatin (PRAVACHOL) 20 MG tablet Take 1 tablet (20 mg total)  by mouth at bedtime. 90 tablet 3  . ranitidine (ZANTAC) 75 MG tablet Take 75 mg by mouth at bedtime.    . tamsulosin (FLOMAX) 0.4 MG CAPS capsule Take 1 capsule (0.4 mg total) by mouth daily. 30 capsule 1   No current facility-administered medications on file prior to visit.     No Known Allergies  There were no vitals taken for this visit.   Essential hypertension: Blood pressure improved since last office visit but remains above goal of 130/80. Patient is tolerating current therapy without problems. Will increase benazepril to 10mg  every morning and 20mg  every evening. Plan to optimized benazepril dose to 40mg  total per day but will continue slow increase due to previous episodes of hypotension. Patient to continue twice daily BP monitoring and bring records to f/u  OV in 4 weeks.  Raquel Rodriguez-Guzman PharmD, BCPS, CPP [facility] [address] [place] [address] 09/17/2018 10:42 AM

## 2018-09-21 ENCOUNTER — Other Ambulatory Visit: Payer: Self-pay | Admitting: Gastroenterology

## 2018-10-07 ENCOUNTER — Other Ambulatory Visit: Payer: Self-pay | Admitting: Cardiovascular Disease

## 2018-10-26 ENCOUNTER — Ambulatory Visit: Payer: Medicare HMO | Admitting: Cardiovascular Disease

## 2018-10-28 ENCOUNTER — Other Ambulatory Visit: Payer: Self-pay | Admitting: Gastroenterology

## 2018-12-09 DIAGNOSIS — E785 Hyperlipidemia, unspecified: Secondary | ICD-10-CM | POA: Insufficient documentation

## 2018-12-09 DIAGNOSIS — R778 Other specified abnormalities of plasma proteins: Secondary | ICD-10-CM

## 2018-12-09 DIAGNOSIS — Z8673 Personal history of transient ischemic attack (TIA), and cerebral infarction without residual deficits: Secondary | ICD-10-CM

## 2018-12-09 DIAGNOSIS — K8020 Calculus of gallbladder without cholecystitis without obstruction: Secondary | ICD-10-CM

## 2018-12-09 DIAGNOSIS — E1140 Type 2 diabetes mellitus with diabetic neuropathy, unspecified: Secondary | ICD-10-CM | POA: Insufficient documentation

## 2018-12-09 DIAGNOSIS — R160 Hepatomegaly, not elsewhere classified: Secondary | ICD-10-CM

## 2018-12-09 DIAGNOSIS — K529 Noninfective gastroenteritis and colitis, unspecified: Secondary | ICD-10-CM

## 2018-12-09 DIAGNOSIS — R9389 Abnormal findings on diagnostic imaging of other specified body structures: Secondary | ICD-10-CM

## 2018-12-09 DIAGNOSIS — M47814 Spondylosis without myelopathy or radiculopathy, thoracic region: Secondary | ICD-10-CM | POA: Insufficient documentation

## 2018-12-09 HISTORY — DX: Other specified abnormalities of plasma proteins: R77.8

## 2018-12-09 HISTORY — DX: Type 2 diabetes mellitus with diabetic neuropathy, unspecified: E11.40

## 2018-12-09 HISTORY — DX: Abnormal findings on diagnostic imaging of other specified body structures: R93.89

## 2018-12-09 HISTORY — DX: Noninfective gastroenteritis and colitis, unspecified: K52.9

## 2018-12-09 HISTORY — DX: Calculus of gallbladder without cholecystitis without obstruction: K80.20

## 2018-12-09 HISTORY — DX: Hepatomegaly, not elsewhere classified: R16.0

## 2018-12-09 HISTORY — DX: Personal history of transient ischemic attack (TIA), and cerebral infarction without residual deficits: Z86.73

## 2018-12-09 HISTORY — DX: Spondylosis without myelopathy or radiculopathy, thoracic region: M47.814

## 2018-12-09 HISTORY — DX: Hyperlipidemia, unspecified: E78.5

## 2018-12-19 ENCOUNTER — Other Ambulatory Visit: Payer: Self-pay | Admitting: Gastroenterology

## 2019-01-21 ENCOUNTER — Ambulatory Visit: Payer: Medicare HMO | Admitting: Adult Health

## 2019-01-31 DIAGNOSIS — M5030 Other cervical disc degeneration, unspecified cervical region: Secondary | ICD-10-CM | POA: Insufficient documentation

## 2019-01-31 DIAGNOSIS — M47812 Spondylosis without myelopathy or radiculopathy, cervical region: Secondary | ICD-10-CM

## 2019-01-31 DIAGNOSIS — M4802 Spinal stenosis, cervical region: Secondary | ICD-10-CM

## 2019-01-31 DIAGNOSIS — M47816 Spondylosis without myelopathy or radiculopathy, lumbar region: Secondary | ICD-10-CM

## 2019-01-31 DIAGNOSIS — E1142 Type 2 diabetes mellitus with diabetic polyneuropathy: Secondary | ICD-10-CM

## 2019-01-31 DIAGNOSIS — M48061 Spinal stenosis, lumbar region without neurogenic claudication: Secondary | ICD-10-CM

## 2019-01-31 DIAGNOSIS — G894 Chronic pain syndrome: Secondary | ICD-10-CM

## 2019-01-31 HISTORY — DX: Spondylosis without myelopathy or radiculopathy, cervical region: M47.812

## 2019-01-31 HISTORY — DX: Spinal stenosis, cervical region: M48.02

## 2019-01-31 HISTORY — DX: Spinal stenosis, lumbar region without neurogenic claudication: M48.061

## 2019-01-31 HISTORY — DX: Type 2 diabetes mellitus with diabetic polyneuropathy: E11.42

## 2019-01-31 HISTORY — DX: Other cervical disc degeneration, unspecified cervical region: M50.30

## 2019-01-31 HISTORY — DX: Spondylosis without myelopathy or radiculopathy, lumbar region: M47.816

## 2019-01-31 HISTORY — DX: Chronic pain syndrome: G89.4

## 2019-03-03 ENCOUNTER — Ambulatory Visit: Payer: Medicare HMO | Admitting: Adult Health

## 2019-05-02 ENCOUNTER — Other Ambulatory Visit: Payer: Self-pay | Admitting: Cardiovascular Disease

## 2019-05-04 DIAGNOSIS — R0602 Shortness of breath: Secondary | ICD-10-CM

## 2019-05-04 HISTORY — DX: Shortness of breath: R06.02

## 2019-05-04 NOTE — Telephone Encounter (Signed)
Rx request sent to pharmacy.  

## 2019-05-07 ENCOUNTER — Emergency Department (HOSPITAL_COMMUNITY): Payer: Medicare HMO

## 2019-05-07 ENCOUNTER — Other Ambulatory Visit: Payer: Self-pay

## 2019-05-07 ENCOUNTER — Inpatient Hospital Stay (HOSPITAL_COMMUNITY)
Admission: EM | Admit: 2019-05-07 | Discharge: 2019-05-17 | DRG: 871 | Disposition: A | Discharge status: Home Health | Payer: Medicare HMO | Attending: Internal Medicine | Admitting: Internal Medicine

## 2019-05-07 DIAGNOSIS — Z8249 Family history of ischemic heart disease and other diseases of the circulatory system: Secondary | ICD-10-CM

## 2019-05-07 DIAGNOSIS — Z7982 Long term (current) use of aspirin: Secondary | ICD-10-CM

## 2019-05-07 DIAGNOSIS — K859 Acute pancreatitis without necrosis or infection, unspecified: Secondary | ICD-10-CM

## 2019-05-07 DIAGNOSIS — E7800 Pure hypercholesterolemia, unspecified: Secondary | ICD-10-CM | POA: Diagnosis present

## 2019-05-07 DIAGNOSIS — I252 Old myocardial infarction: Secondary | ICD-10-CM

## 2019-05-07 DIAGNOSIS — R17 Unspecified jaundice: Secondary | ICD-10-CM

## 2019-05-07 DIAGNOSIS — R140 Abdominal distension (gaseous): Secondary | ICD-10-CM

## 2019-05-07 DIAGNOSIS — Z1159 Encounter for screening for other viral diseases: Secondary | ICD-10-CM

## 2019-05-07 DIAGNOSIS — B952 Enterococcus as the cause of diseases classified elsewhere: Secondary | ICD-10-CM | POA: Diagnosis present

## 2019-05-07 DIAGNOSIS — I2510 Atherosclerotic heart disease of native coronary artery without angina pectoris: Secondary | ICD-10-CM

## 2019-05-07 DIAGNOSIS — Z833 Family history of diabetes mellitus: Secondary | ICD-10-CM

## 2019-05-07 DIAGNOSIS — R0602 Shortness of breath: Secondary | ICD-10-CM

## 2019-05-07 DIAGNOSIS — K819 Cholecystitis, unspecified: Secondary | ICD-10-CM

## 2019-05-07 DIAGNOSIS — R7989 Other specified abnormal findings of blood chemistry: Secondary | ICD-10-CM

## 2019-05-07 DIAGNOSIS — I10 Essential (primary) hypertension: Secondary | ICD-10-CM | POA: Diagnosis present

## 2019-05-07 DIAGNOSIS — R410 Disorientation, unspecified: Secondary | ICD-10-CM | POA: Diagnosis not present

## 2019-05-07 DIAGNOSIS — Z955 Presence of coronary angioplasty implant and graft: Secondary | ICD-10-CM

## 2019-05-07 DIAGNOSIS — Z781 Physical restraint status: Secondary | ICD-10-CM

## 2019-05-07 DIAGNOSIS — R069 Unspecified abnormalities of breathing: Secondary | ICD-10-CM

## 2019-05-07 DIAGNOSIS — Z6839 Body mass index (BMI) 39.0-39.9, adult: Secondary | ICD-10-CM

## 2019-05-07 DIAGNOSIS — E860 Dehydration: Secondary | ICD-10-CM | POA: Diagnosis present

## 2019-05-07 DIAGNOSIS — Z7951 Long term (current) use of inhaled steroids: Secondary | ICD-10-CM

## 2019-05-07 DIAGNOSIS — E1165 Type 2 diabetes mellitus with hyperglycemia: Secondary | ICD-10-CM | POA: Diagnosis present

## 2019-05-07 DIAGNOSIS — R0600 Dyspnea, unspecified: Secondary | ICD-10-CM

## 2019-05-07 DIAGNOSIS — R7401 Elevation of levels of liver transaminase levels: Secondary | ICD-10-CM

## 2019-05-07 DIAGNOSIS — I69354 Hemiplegia and hemiparesis following cerebral infarction affecting left non-dominant side: Secondary | ICD-10-CM

## 2019-05-07 DIAGNOSIS — Z7902 Long term (current) use of antithrombotics/antiplatelets: Secondary | ICD-10-CM

## 2019-05-07 DIAGNOSIS — N390 Urinary tract infection, site not specified: Secondary | ICD-10-CM | POA: Diagnosis present

## 2019-05-07 DIAGNOSIS — A419 Sepsis, unspecified organism: Secondary | ICD-10-CM

## 2019-05-07 DIAGNOSIS — R6521 Severe sepsis with septic shock: Secondary | ICD-10-CM | POA: Diagnosis present

## 2019-05-07 DIAGNOSIS — E870 Hyperosmolality and hypernatremia: Secondary | ICD-10-CM | POA: Diagnosis present

## 2019-05-07 DIAGNOSIS — E1159 Type 2 diabetes mellitus with other circulatory complications: Secondary | ICD-10-CM | POA: Diagnosis present

## 2019-05-07 DIAGNOSIS — A4159 Other Gram-negative sepsis: Principal | ICD-10-CM | POA: Diagnosis present

## 2019-05-07 DIAGNOSIS — K8309 Other cholangitis: Secondary | ICD-10-CM | POA: Diagnosis present

## 2019-05-07 DIAGNOSIS — E785 Hyperlipidemia, unspecified: Secondary | ICD-10-CM | POA: Diagnosis present

## 2019-05-07 DIAGNOSIS — R531 Weakness: Secondary | ICD-10-CM

## 2019-05-07 DIAGNOSIS — E872 Acidosis: Secondary | ICD-10-CM | POA: Diagnosis present

## 2019-05-07 DIAGNOSIS — G4733 Obstructive sleep apnea (adult) (pediatric): Secondary | ICD-10-CM | POA: Diagnosis present

## 2019-05-07 DIAGNOSIS — R739 Hyperglycemia, unspecified: Secondary | ICD-10-CM

## 2019-05-07 DIAGNOSIS — N179 Acute kidney failure, unspecified: Secondary | ICD-10-CM

## 2019-05-07 DIAGNOSIS — N400 Enlarged prostate without lower urinary tract symptoms: Secondary | ICD-10-CM | POA: Diagnosis present

## 2019-05-07 DIAGNOSIS — E1151 Type 2 diabetes mellitus with diabetic peripheral angiopathy without gangrene: Secondary | ICD-10-CM | POA: Diagnosis present

## 2019-05-07 DIAGNOSIS — E11319 Type 2 diabetes mellitus with unspecified diabetic retinopathy without macular edema: Secondary | ICD-10-CM | POA: Diagnosis present

## 2019-05-07 DIAGNOSIS — Z9861 Coronary angioplasty status: Secondary | ICD-10-CM

## 2019-05-07 DIAGNOSIS — K219 Gastro-esophageal reflux disease without esophagitis: Secondary | ICD-10-CM | POA: Diagnosis present

## 2019-05-07 DIAGNOSIS — E1140 Type 2 diabetes mellitus with diabetic neuropathy, unspecified: Secondary | ICD-10-CM | POA: Diagnosis present

## 2019-05-07 DIAGNOSIS — Z794 Long term (current) use of insulin: Secondary | ICD-10-CM

## 2019-05-07 DIAGNOSIS — Z79899 Other long term (current) drug therapy: Secondary | ICD-10-CM

## 2019-05-07 DIAGNOSIS — N19 Unspecified kidney failure: Secondary | ICD-10-CM

## 2019-05-07 DIAGNOSIS — K851 Biliary acute pancreatitis without necrosis or infection: Secondary | ICD-10-CM | POA: Diagnosis present

## 2019-05-07 DIAGNOSIS — R109 Unspecified abdominal pain: Secondary | ICD-10-CM

## 2019-05-07 DIAGNOSIS — R509 Fever, unspecified: Secondary | ICD-10-CM

## 2019-05-07 DIAGNOSIS — K8021 Calculus of gallbladder without cholecystitis with obstruction: Secondary | ICD-10-CM | POA: Diagnosis present

## 2019-05-07 DIAGNOSIS — Z8673 Personal history of transient ischemic attack (TIA), and cerebral infarction without residual deficits: Secondary | ICD-10-CM

## 2019-05-07 DIAGNOSIS — G92 Toxic encephalopathy: Secondary | ICD-10-CM | POA: Diagnosis present

## 2019-05-07 DIAGNOSIS — J449 Chronic obstructive pulmonary disease, unspecified: Secondary | ICD-10-CM | POA: Diagnosis present

## 2019-05-07 DIAGNOSIS — E876 Hypokalemia: Secondary | ICD-10-CM | POA: Diagnosis not present

## 2019-05-07 DIAGNOSIS — I248 Other forms of acute ischemic heart disease: Secondary | ICD-10-CM | POA: Diagnosis not present

## 2019-05-07 DIAGNOSIS — J9690 Respiratory failure, unspecified, unspecified whether with hypoxia or hypercapnia: Secondary | ICD-10-CM

## 2019-05-07 LAB — CBC WITH DIFFERENTIAL/PLATELET
Abs Immature Granulocytes: 0.07 10*3/uL (ref 0.00–0.07)
Basophils Absolute: 0 10*3/uL (ref 0.0–0.1)
Basophils Relative: 0 %
Eosinophils Absolute: 0.1 10*3/uL (ref 0.0–0.5)
Eosinophils Relative: 0 %
HCT: 44.2 % (ref 39.0–52.0)
Hemoglobin: 14.1 g/dL (ref 13.0–17.0)
Immature Granulocytes: 1 %
Lymphocytes Relative: 3 %
Lymphs Abs: 0.4 10*3/uL — ABNORMAL LOW (ref 0.7–4.0)
MCH: 29.2 pg (ref 26.0–34.0)
MCHC: 31.9 g/dL (ref 30.0–36.0)
MCV: 91.5 fL (ref 80.0–100.0)
Monocytes Absolute: 0.7 10*3/uL (ref 0.1–1.0)
Monocytes Relative: 5 %
Neutro Abs: 12.9 10*3/uL — ABNORMAL HIGH (ref 1.7–7.7)
Neutrophils Relative %: 91 %
Platelets: 204 10*3/uL (ref 150–400)
RBC: 4.83 MIL/uL (ref 4.22–5.81)
RDW: 15 % (ref 11.5–15.5)
WBC: 14.2 10*3/uL — ABNORMAL HIGH (ref 4.0–10.5)
nRBC: 0 % (ref 0.0–0.2)

## 2019-05-07 LAB — COMPREHENSIVE METABOLIC PANEL
ALT: 194 U/L — ABNORMAL HIGH (ref 0–44)
AST: 309 U/L — ABNORMAL HIGH (ref 15–41)
Albumin: 3.3 g/dL — ABNORMAL LOW (ref 3.5–5.0)
Alkaline Phosphatase: 86 U/L (ref 38–126)
Anion gap: 12 (ref 5–15)
BUN: 30 mg/dL — ABNORMAL HIGH (ref 6–20)
CO2: 24 mmol/L (ref 22–32)
Calcium: 9 mg/dL (ref 8.9–10.3)
Chloride: 103 mmol/L (ref 98–111)
Creatinine, Ser: 1.29 mg/dL — ABNORMAL HIGH (ref 0.61–1.24)
GFR calc Af Amer: >60 mL/min (ref >60)
GFR calc non Af Amer: >60 mL/min (ref >60)
Glucose, Bld: 373 mg/dL — ABNORMAL HIGH (ref 70–99)
Potassium: 3.8 mmol/L (ref 3.5–5.1)
Sodium: 139 mmol/L (ref 135–145)
Total Bilirubin: 2.3 mg/dL — ABNORMAL HIGH (ref 0.3–1.2)
Total Protein: 6.3 g/dL — ABNORMAL LOW (ref 6.5–8.1)

## 2019-05-07 LAB — POCT I-STAT 7, (LYTES, BLD GAS, ICA,H+H)
Acid-Base Excess: 1 mmol/L (ref 0.0–2.0)
Bicarbonate: 25 mmol/L (ref 20.0–28.0)
Calcium, Ion: 1.19 mmol/L (ref 1.15–1.40)
HCT: 40 % (ref 39.0–52.0)
Hemoglobin: 13.6 g/dL (ref 13.0–17.0)
O2 Saturation: 96 %
Patient temperature: 98.6
Potassium: 3.8 mmol/L (ref 3.5–5.1)
Sodium: 140 mmol/L (ref 135–145)
TCO2: 26 mmol/L (ref 22–32)
pCO2 arterial: 37 mmHg (ref 32.0–48.0)
pH, Arterial: 7.437 (ref 7.350–7.450)
pO2, Arterial: 78 mmHg — ABNORMAL LOW (ref 83.0–108.0)

## 2019-05-07 LAB — URINALYSIS, ROUTINE W REFLEX MICROSCOPIC
Bacteria, UA: NONE SEEN
Bilirubin Urine: NEGATIVE
Glucose, UA: >=500 mg/dL — AB
Hgb urine dipstick: NEGATIVE
Ketones, ur: NEGATIVE mg/dL
Leukocytes,Ua: NEGATIVE
Nitrite: NEGATIVE
Protein, ur: NEGATIVE mg/dL
Specific Gravity, Urine: 1.021 (ref 1.005–1.030)
pH: 5 (ref 5.0–8.0)

## 2019-05-07 LAB — LACTIC ACID, PLASMA: Lactic Acid, Venous: 3.9 mmol/L (ref 0.5–1.9)

## 2019-05-07 LAB — CBG MONITORING, ED: Glucose-Capillary: 353 mg/dL — ABNORMAL HIGH (ref 70–99)

## 2019-05-07 LAB — SARS CORONAVIRUS 2 BY RT PCR (HOSPITAL ORDER, PERFORMED IN ~~LOC~~ HOSPITAL LAB): SARS Coronavirus 2: NEGATIVE

## 2019-05-07 IMAGING — US ULTRASOUND ABDOMEN LIMITED
1 series · 14 of 25 positions shown · non-contrast
Comparison: Prior CT from [DATE]

CLINICAL DATA: Initial evaluation for acute fever, elevated LFTs
and bilirubin.

EXAM:
ULTRASOUND ABDOMEN LIMITED RIGHT UPPER QUADRANT

[Series 1: ultrasound abdomen limited · 14 of 50 slices shown]
[im 1/50]
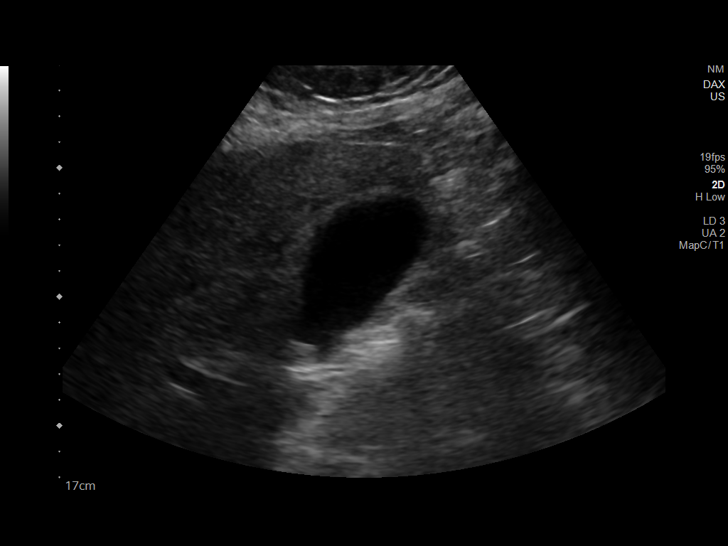
[im 5/50]
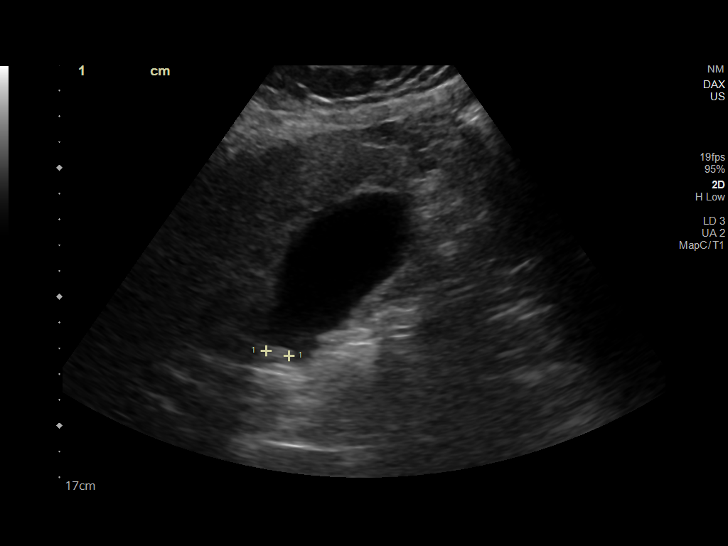
[im 9/50]
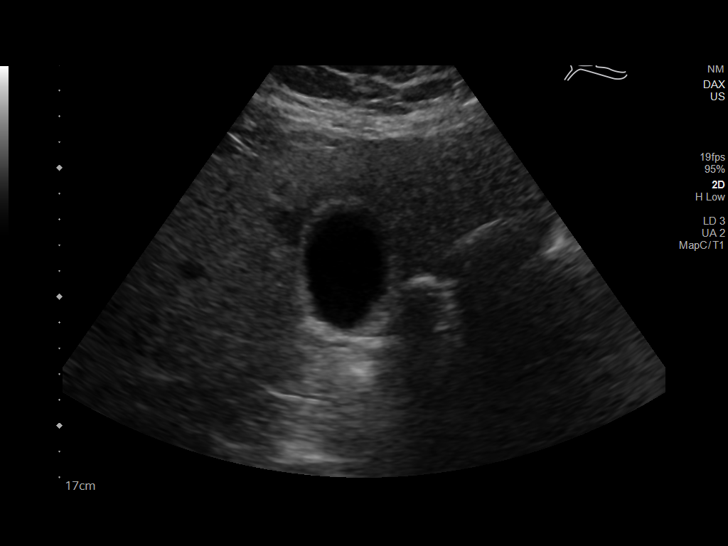
[im 13/50]
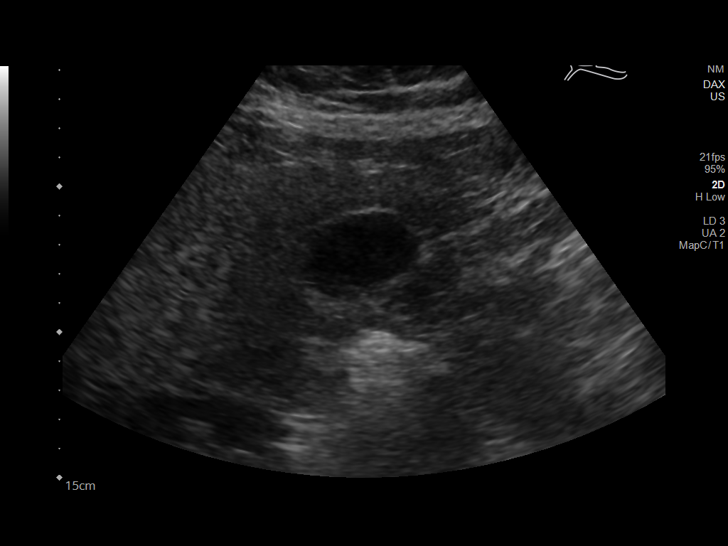
[im 17/50]
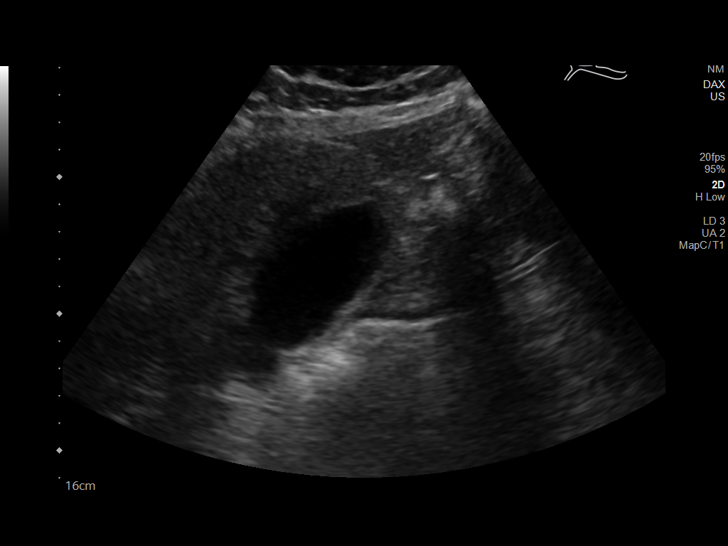
[im 19/50]
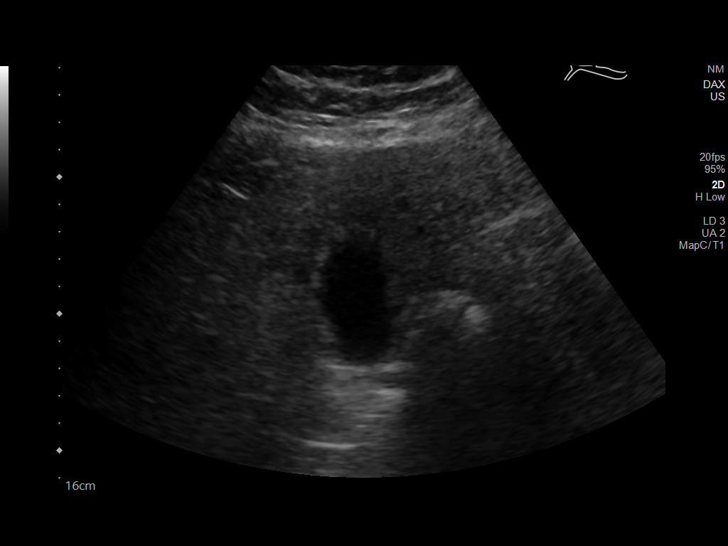
[im 23/50]
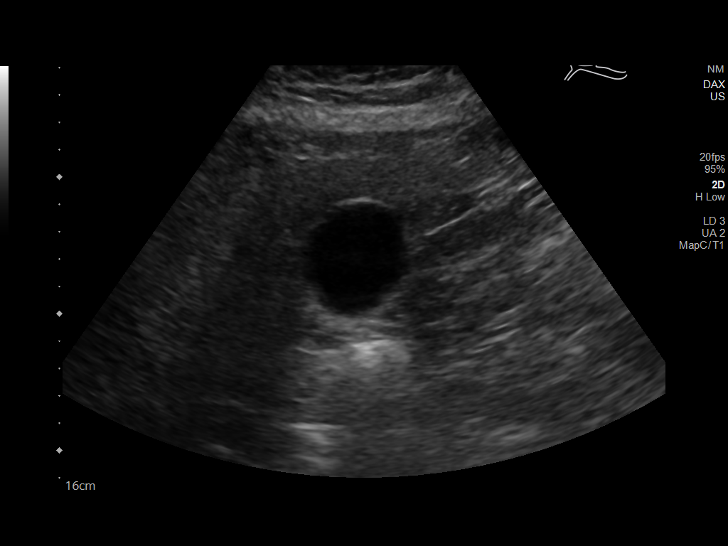
[im 27/50]
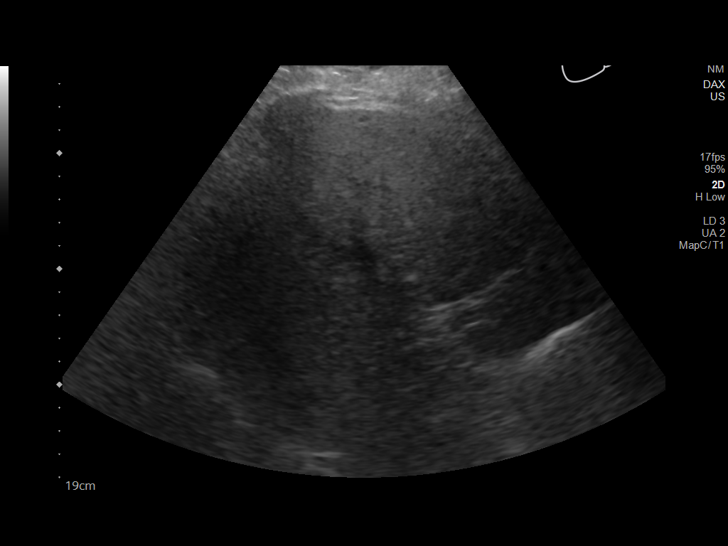
[im 31/50]
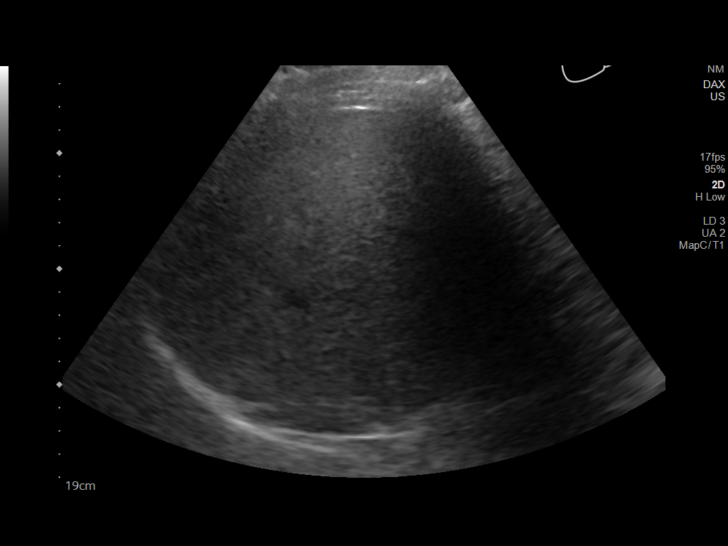
[im 33/50]
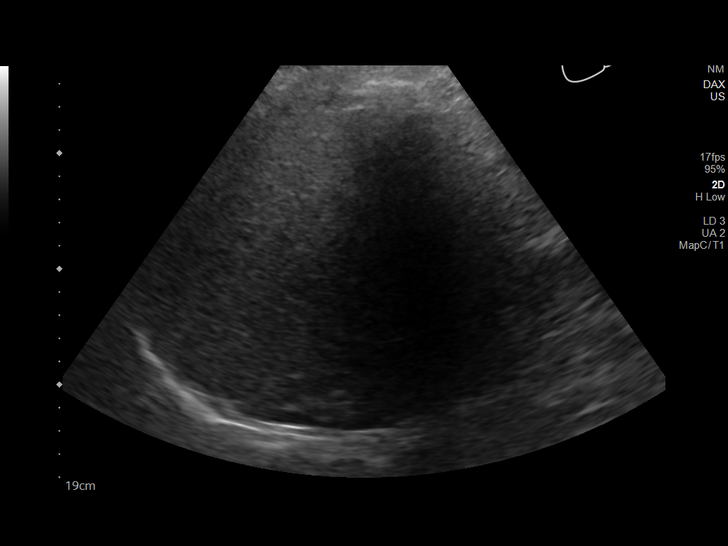
[im 37/50]
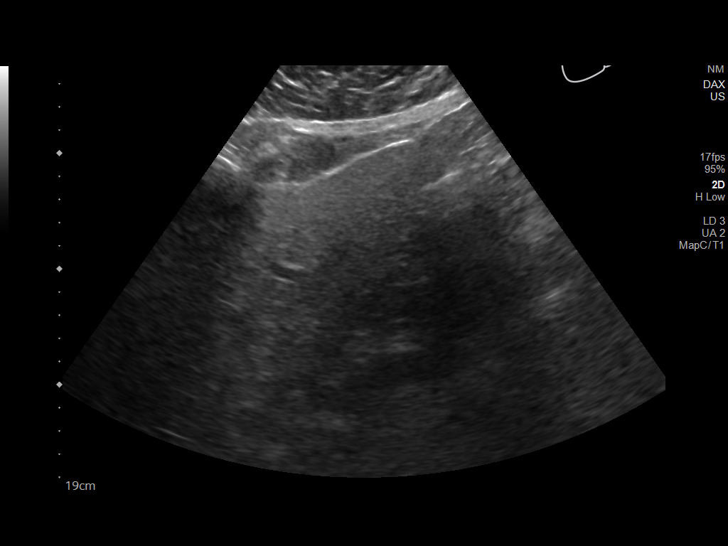
[im 41/50]
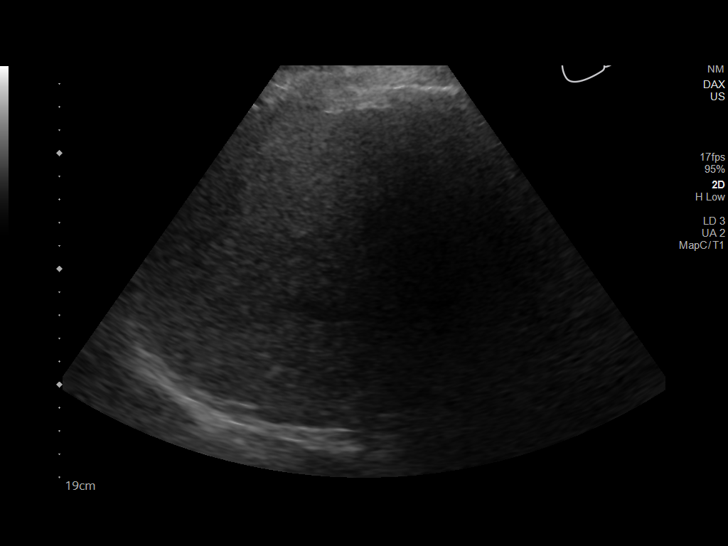
[im 45/50]
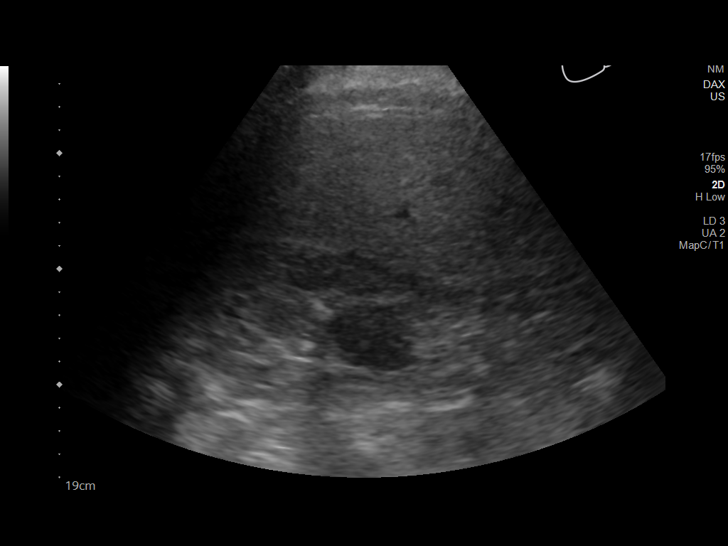
[im 50/50]
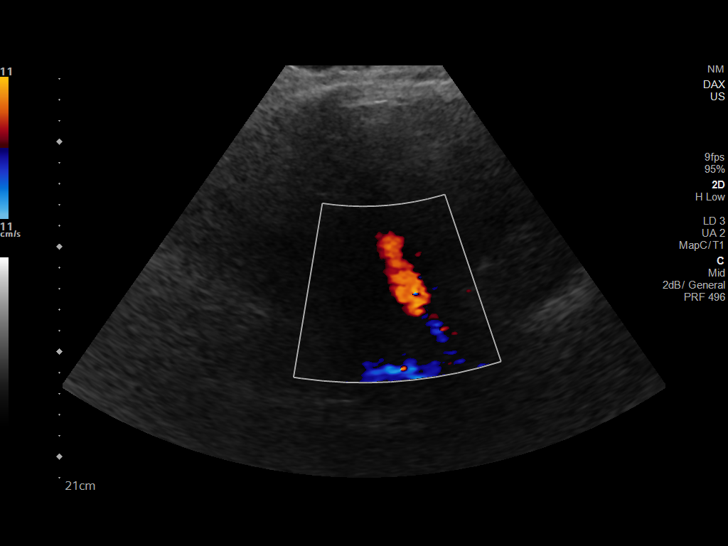

[14 of 25 positions shown; findings below may reference images not displayed]

FINDINGS: Gallbladder:

9 mm echogenic focus at the gallbladder neck could reflect a small
stone versus tumefactive sludge. Gallbladder wall measured at the
upper limits of normal at 3 mm. No free pericholecystic fluid. No
sonographic Murphy sign elicited on exam.

Common bile duct:

Diameter: 3.7 mm

Liver:

No focal lesion identified. Diffusely increased echogenicity within
the hepatic parenchyma. Question of fine nodularity of the hepatic
contour. Portal vein is patent on color Doppler imaging with normal
direction of blood flow towards the liver.
IMPRESSION: 1. 9 mm stone versus tumefactive sludge within the gallbladder neck.
No sonographic features to suggest acute cholecystitis.
2. No biliary dilatation.
3. Increased echogenicity within the hepatic parenchyma with
question of subtle nodularity of the hepatic contour, which could
reflect sequelae of cirrhosis and/or steatosis.

## 2019-05-07 IMAGING — DX PORTABLE CHEST - 1 VIEW
1 series · 1 of 1 positions shown · non-contrast
Comparison: [DATE]

CLINICAL DATA: Fever

EXAM:
PORTABLE CHEST 1 VIEW

[chest ap]
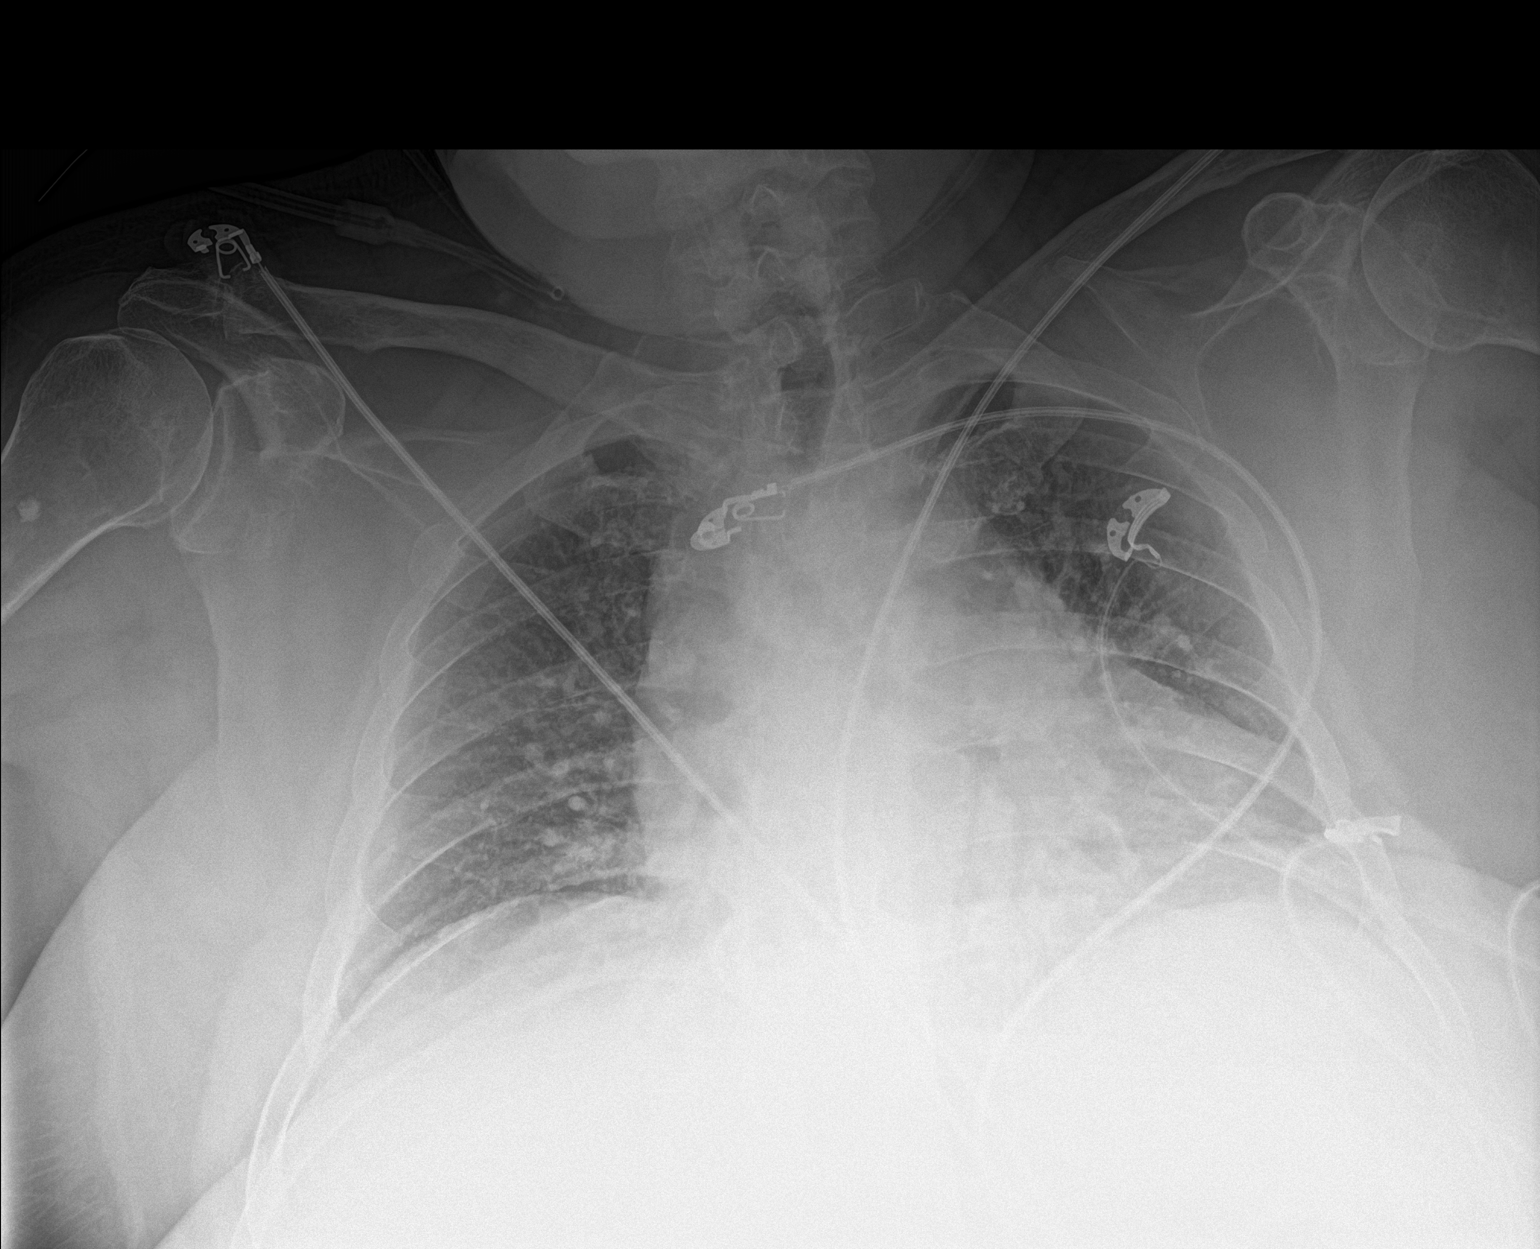

[1 of 1 positions shown; findings below may reference images not displayed]

FINDINGS: Mild cardiomegaly with pulmonary vascular congestion. No overt
edema. No pneumothorax or sizable pleural effusion. No focal
airspace consolidation.
IMPRESSION: Mild cardiomegaly with pulmonary vascular congestion.

## 2019-05-07 MED ORDER — SODIUM CHLORIDE 0.9 % IV BOLUS
1000.0000 mL | Freq: Once | INTRAVENOUS | Status: AC
  Administered 2019-05-07: 1000 mL via INTRAVENOUS

## 2019-05-07 MED ORDER — VANCOMYCIN HCL 10 G IV SOLR
1500.0000 mg | Freq: Once | INTRAVENOUS | Status: AC
  Administered 2019-05-07: 1500 mg via INTRAVENOUS
  Filled 2019-05-07: qty 1500

## 2019-05-07 MED ORDER — VANCOMYCIN HCL IN DEXTROSE 1-5 GM/200ML-% IV SOLN: 1000.0000 mg | Freq: Once | INTRAVENOUS | Status: DC

## 2019-05-07 MED ORDER — SODIUM CHLORIDE 0.9 % IV SOLN
2.0000 g | Freq: Once | INTRAVENOUS | Status: AC
  Administered 2019-05-07: 22:00:00 2 g via INTRAVENOUS
  Filled 2019-05-07: qty 2

## 2019-05-07 MED ORDER — SODIUM CHLORIDE 0.9 % IV SOLN
2.0000 g | Freq: Three times a day (TID) | INTRAVENOUS | Status: DC
  Administered 2019-05-08 – 2019-05-10 (×7): 2 g via INTRAVENOUS
  Filled 2019-05-07 (×10): qty 2

## 2019-05-07 MED ORDER — VANCOMYCIN HCL 10 G IV SOLR
1750.0000 mg | Freq: Two times a day (BID) | INTRAVENOUS | Status: DC
  Administered 2019-05-08 – 2019-05-09 (×3): 1750 mg via INTRAVENOUS
  Filled 2019-05-07 (×6): qty 1750

## 2019-05-07 NOTE — ED Triage Notes (Signed)
Pt BIB EMS with AMS. Pt oriented to person and place. Pt history of 2 strokes with left side deficit. Pt. With pinpoint pupils unable to follow gaze.   Pt had cardiac cath on Tuesday at [facility]. Abdomin distended with increased work of breathing and elevated RR 29. Pt. Normally on 3 L of O2 at home.   Per EMS LKW 2:30 pm

## 2019-05-07 NOTE — ED Notes (Signed)
Johnathan Keith [phone] pts wife call when an update is available

## 2019-05-07 NOTE — Progress Notes (Signed)
Pharmacy Antibiotic Note  Johnathan Keith is a 58 y.o. male admitted on 05/07/2019 with sepsis.  Pharmacy has been consulted for Vancomycin and Cefepime dosing.  Vancomycin 1750 mg IV Q 12 hrs. Goal AUC 400-550. Expected AUC: 495 SCr used: 1.29   Plan: Cefepime 2 gms IV q8hr Vanc 1500 mg IV x 1 in ED, then 1750 mg IV q12hr Monitor renal function, clinical status, C&S and vanc levels as needed  Height: 5\' 6"  (167.6 cm) Weight: 250 lb (113.4 kg) IBW/kg (Calculated) : 63.8  Temp (24hrs), Avg:100.3 F (37.9 C), Min:100.3 F (37.9 C), Max:100.3 F (37.9 C)  Recent Labs  Lab 05/07/19 2052  WBC 14.2*  CREATININE 1.29*  LATICACIDVEN 3.9*    Estimated Creatinine Clearance: 72.9 mL/min (A) (by C-G formula based on SCr of 1.29 mg/dL (H)).    No Known Allergies  Antimicrobials this admission: Vanc 5/29 >>  Cefepime 5/29 >>   Thank you for allowing pharmacy to be a part of this patient's care.  Jeanella Cara, PharmD, [facility] Clinical Pharmacist Please see AMION for all Pharmacists' Contact Phone Numbers 05/07/2019, 9:51 PM

## 2019-05-07 NOTE — ED Notes (Signed)
Nurse starting 2nd IV and will collect 2nd set of blood cultures.

## 2019-05-07 NOTE — ED Notes (Signed)
[name] [phone] pts wife

## 2019-05-07 NOTE — ED Provider Notes (Addendum)
[name] EMERGENCY DEPARTMENT Provider Note   CSN: [id number] Arrival date & time: 05/07/19  2026    History   Chief Complaint Chief Complaint  Patient presents with   Altered Mental Status    HPI Johnathan Keith is a 58 y.o. male.     Patient with hx diabetes, presents  Via EMS with altered mental status, confusion, general weakness since approximately 2 pm today. Pt is very limited/poor historian - level 5 caveat - altered mental status. Per report, no hx trauma or fall. Patient denies any pain, no headache, no chest or abd pain. No report of vomiting or diarrhea. Denies cough. Low grade fever in ED. On arrival CBG in 300's. Unknown whether patient taking normal meds.   The history is provided by the patient and the EMS personnel. The history is limited by the condition of the patient.  Altered Mental Status  Presenting symptoms: confusion   Associated symptoms: fever   Associated symptoms: no abdominal pain, no headaches and no rash     Past Medical History:  Diagnosis Date   Chronic neck and back pain    Coronary artery disease    drug-eluting stents placed in proximal and mid LAD   Deviated septum    Diabetic retinopathy (HCC)    GERD (gastroesophageal reflux disease)    "occasionally" (03/23/2015)   Headache    "more than 2/wk" (03/23/2015)   Heart attack (HCC)    "in 1997 was told I had signs of a small heart attack that I didn't know I'd had"   High cholesterol    History of gout    "when I was younger"   Hypertension    Kidney stones    Migraine    "had my 1st and only in 12/2014" (03/23/2015)   Peripheral vascular disease (HCC)    carotid artery disease   PONV (postoperative nausea and vomiting)    "was told I was confused and combative in recovery from the narcotics w/my carotid OR"   Sleep apnea    "severe; couldn't afford mask" (03/23/2015)   Stroke (HCC) 2014   "on walker for 2 months; left side is weaker and numb  since" (03/23/2015)   Type II diabetes mellitus (HCC)     Patient Active Problem List   Diagnosis Date Noted   Bilateral lower extremity edema 08/18/2018   History of stroke 04/20/2018   Sleep apnea 02/27/2017   Occipital neuralgia of right side 12/18/2016   CAD -S/P PCI-2016 11/30/2015   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HCC) 09/13/2015   Carotid stenosis 09/13/2015   Bilateral low back pain without sciatica 09/13/2015   Carpal tunnel syndrome 07/17/2015   Lower back pain 06/14/2015   Abnormal nuclear cardiac imaging test    Dyspnea on exertion    Essential hypertension 11/09/2014   Hyperlipidemia 10/26/2014   Carotid artery stenosis- 09/26/2014   Cerebrovascular accident (CVA) due to thrombosis of right middle cerebral artery (HCC) 07/04/2014   Type 2 diabetes mellitus with vascular disease (HCC) 07/04/2014    Past Surgical History:  Procedure Laterality Date   CIRCUMCISION  1981   CORONARY ANGIOPLASTY WITH STENT PLACEMENT  03/23/2015   "2"   CORONARY STENT INTERVENTION N/A 09/29/2017   Procedure: CORONARY STENT INTERVENTION;  Surgeon: Runell Gess, MD;  Location: [facility] INVASIVE CV LAB;  Service: Cardiovascular;  Laterality: N/A;   ENDARTERECTOMY Right 09/26/2014   Procedure: RIGHT CAROTID ENDARTERECTOMY WITH PATCH ANGIOPLASTY;  Surgeon: Ottie Glazier  [name], MD;  Location: [facility] OR;  Service: Vascular;  Laterality: Right;   EYE SURGERY Right May 2016   Cataract   EYE SURGERY Left July 2016   Cataract   HYDROCELE EXCISION  ~ 2008   LEFT HEART CATH AND CORONARY ANGIOGRAPHY N/A 09/29/2017   Procedure: LEFT HEART CATH AND CORONARY ANGIOGRAPHY;  Surgeon: Runell Gess, MD;  Location: [facility] INVASIVE CV LAB;  Service: Cardiovascular;  Laterality: N/A;   LEFT HEART CATHETERIZATION WITH CORONARY ANGIOGRAM N/A 03/23/2015   Procedure: LEFT HEART CATHETERIZATION WITH CORONARY ANGIOGRAM;  Surgeon: Runell Gess, MD;  Location: [facility]  CATH LAB;  Service: Cardiovascular;  Laterality: N/A;   REFRACTIVE SURGERY Bilateral 08/2014-09/2014   Diabetic retinopathy         Home Medications    Prior to Admission medications   Medication Sig Start Date End Date Taking? Authorizing Provider  albuterol (PROVENTIL HFA;VENTOLIN HFA) 108 (90 Base) MCG/ACT inhaler Inhale 1 puff into the lungs every 6 (six) hours as needed for wheezing or shortness of breath.    [provider]  aspirin 81 MG tablet Take 1 tablet (81 mg total) by mouth daily. 07/21/18   George Hugh, NP  azelastine (ASTELIN) 0.1 % nasal spray Place 1 spray into both nostrils as directed. Use in each nostril as directed    [provider]  benazepril (LOTENSIN) 10 MG tablet TAKE 2 tablets by mouth TWICE daily 08/18/18   Runell Gess, MD  cefdinir (OMNICEF) 300 MG capsule Take 1 capsule by mouth 2 (two) times daily. 08/17/18   [provider]  Cholecalciferol (VITAMIN D) 2000 UNITS tablet Take 2,000 Units by mouth daily.    [provider]  clopidogrel (PLAVIX) 75 MG tablet Take 1 tablet by mouth once daily 05/04/19   Runell Gess, MD  cyclobenzaprine (FLEXERIL) 5 MG tablet Take 5 mg by mouth daily as needed for muscle spasms.     [provider]  dicyclomine (BENTYL) 10 MG capsule Take 10 mg by mouth 4 (four) times daily.  04/22/15   [provider]  dicyclomine (BENTYL) 10 MG capsule TAKE 1 CAPSULE BY MOUTH 4 TIMES DAILY 12/21/18   Lynann Bologna, MD  furosemide (LASIX) 80 MG tablet Take 1 tablet BY MOUTH DAILY 08/18/18   Runell Gess, MD  gabapentin (NEURONTIN) 100 MG capsule Take 1 capsule (100 mg total) by mouth 3 (three) times daily. Patient taking differently: Take 300 mg by mouth at bedtime.  06/15/17   Marvel Plan, MD  glimepiride (AMARYL) 4 MG tablet Take 4 mg by mouth 2 (two) times daily. 10/18/16   [provider]  hydrALAZINE (APRESOLINE) 25 MG tablet Take 1 tablet (25 mg total) by mouth  3 (three) times daily. 05/15/18   Runell Gess, MD  hydrochlorothiazide (MICROZIDE) 12.5 MG capsule Take 12.5 mg by mouth as needed (only take when blood pressure systolic is 170 and diastolic is 100).    [provider]  insulin NPH Human (HUMULIN N,NOVOLIN N) 100 UNIT/ML injection Inject into the skin as directed. Pt takes 40 units in the morning and 55 units at bedtime 06/23/17   [provider]  insulin regular (NOVOLIN R) 100 units/mL injection daily before breakfast. Sliding Scale    [provider]  loperamide (IMODIUM) 2 MG capsule Take 2-4 mg by mouth as needed for diarrhea or loose stools.     [provider]  metoprolol succinate (TOPROL-XL) 25 MG 24 hr tablet Take 3 tablets (  75 mg total) by mouth daily. 05/15/18   Runell Gess, MD  nitroGLYCERIN (NITROSTAT) 0.4 MG SL tablet Place 1 tablet (0.4 mg total) under the tongue every 5 (five) minutes as needed for chest pain. 08/18/18   Runell Gess, MD  Pancrelipase, Lip-Prot-Amyl, (ZENPEP) 40000-126000 units CPEP Take 1 capsule by mouth 3 (three) times daily with meals. 05/25/18   Lynann Bologna, MD  pantoprazole (PROTONIX) 40 MG tablet Take 1 tablet (40 mg total) by mouth daily. 03/24/18   Lynann Bologna, MD  pravastatin (PRAVACHOL) 20 MG tablet Take 1 tablet (20 mg total) by mouth at bedtime. 08/18/18   Runell Gess, MD  ranitidine (ZANTAC) 75 MG tablet Take 75 mg by mouth at bedtime.    [provider]  tamsulosin (FLOMAX) 0.4 MG CAPS capsule Take 1 capsule (0.4 mg total) by mouth daily. 09/27/14   Dara Lords, PA-C    Family History Family History  Problem Relation Age of Onset   Heart disease Mother    Diabetes Mother    Hyperlipidemia Mother    Hypertension Mother    Cancer Father        Lung Cancer    Heart disease Father    Heart attack Father    Gout Brother    Asthma Brother    Hyperlipidemia Brother    Hypertension Brother    Diabetes Brother     Colon cancer Brother        colon   Stroke Paternal Grandfather    Colon cancer Sister    Hyperlipidemia Sister    Hypertension Sister    Gout Maternal Uncle    Diabetes Maternal Grandfather    Diabetes Daughter     Social History Social History   Tobacco Use   Smoking status: Never Smoker   Smokeless tobacco: Never Used  Substance Use Topics   Alcohol use: No    Alcohol/week: 0.0 standard drinks   Drug use: No     Allergies   Patient has no known allergies.   Review of Systems Review of Systems  Constitutional: Positive for fever.  HENT: Negative for sore throat.   Eyes: Negative for redness.  Respiratory: Negative for cough and shortness of breath.   Cardiovascular: Negative for chest pain.  Gastrointestinal: Negative for abdominal pain.  Genitourinary: Negative for flank pain.  Musculoskeletal: Negative for back pain and neck pain.  Skin: Negative for rash.  Neurological: Negative for headaches.  Hematological: Does not bruise/bleed easily.  Psychiatric/Behavioral: Positive for confusion.     Physical Exam Updated Vital Signs BP 125/62    Pulse (!) 123    Temp 100.3 F (37.9 C) (Oral)    Resp (!) 36    Ht 1.676 m ( )    Wt 113.4 kg    SpO2 100%    BMI 40.35 kg/m   Physical Exam Vitals signs and nursing note reviewed.  Constitutional:      Appearance: Normal appearance. He is well-developed.  HENT:     Head: Atraumatic.     Nose: Nose normal.     Mouth/Throat:     Mouth: Mucous membranes are moist.     Pharynx: Oropharynx is clear.  Eyes:     General: No scleral icterus.    Conjunctiva/sclera: Conjunctivae normal.     Pupils: Pupils are equal, round, and reactive to light.  Neck:     Musculoskeletal: Normal range of motion and neck supple. No neck rigidity.     Trachea:  No tracheal deviation.  Cardiovascular:     Rate and Rhythm: Regular rhythm. Tachycardia present.     Pulses: Normal pulses.     Heart sounds: Normal heart  sounds. No murmur. No friction rub. No gallop.   Pulmonary:     Effort: Pulmonary effort is normal. No accessory muscle usage or respiratory distress.     Breath sounds: Normal breath sounds.  Abdominal:     General: Bowel sounds are normal. There is no distension.     Palpations: Abdomen is soft.     Tenderness: There is no abdominal tenderness. There is no guarding.  Genitourinary:    Comments: No cva tenderness. Musculoskeletal:     Right lower leg: Edema present.     Left lower leg: Edema present.     Comments: Mild erythema and mild increased warmth to LLE, ?cellulitis.   Skin:    General: Skin is warm and dry.     Findings: No rash.  Neurological:     Mental Status: He is alert.     Comments: Alert, speech fluent. Makes purposeful movement bil extremities, poor at following commands.   Psychiatric:     Comments: Slow to respond.       ED Treatments / Results  Labs (all labs ordered are listed, but only abnormal results are displayed) Results for orders placed or performed during the hospital encounter of 05/07/19  SARS Coronavirus 2 (CEPHEID- Performed in [facility] hospital lab), [facility] The Order  Result Value Ref Range   SARS Coronavirus 2 NEGATIVE NEGATIVE  Lactic acid, plasma  Result Value Ref Range   Lactic Acid, Venous 3.9 (HH) 0.5 - 1.9 mmol/L  Comprehensive metabolic panel  Result Value Ref Range   Sodium 139 135 - 145 mmol/L   Potassium 3.8 3.5 - 5.1 mmol/L   Chloride 103 98 - 111 mmol/L   CO2 24 22 - 32 mmol/L   Glucose, Bld 373 (H) 70 - 99 mg/dL   BUN 30 (H) 6 - 20 mg/dL   Creatinine, Ser 1.61 (H) 0.61 - 1.24 mg/dL   Calcium 9.0 8.9 - 09.6 mg/dL   Total Protein 6.3 (L) 6.5 - 8.1 g/dL   Albumin 3.3 (L) 3.5 - 5.0 g/dL   AST 045 (H) 15 - 41 U/L   ALT 194 (H) 0 - 44 U/L   Alkaline Phosphatase 86 38 - 126 U/L   Total Bilirubin 2.3 (H) 0.3 - 1.2 mg/dL   GFR calc non Af Amer >60 >60 mL/min   GFR calc Af Amer >60 >60 mL/min   Anion gap 12 5 - 15  CBC WITH  DIFFERENTIAL  Result Value Ref Range   WBC 14.2 (H) 4.0 - 10.5 K/uL   RBC 4.83 4.22 - 5.81 MIL/uL   Hemoglobin 14.1 13.0 - 17.0 g/dL   HCT 40.9 81.1 - 91.4 %   MCV 91.5 80.0 - 100.0 fL   MCH 29.2 26.0 - 34.0 pg   MCHC 31.9 30.0 - 36.0 g/dL   RDW 78.2 95.6 - 21.3 %   Platelets 204 150 - 400 K/uL   nRBC 0.0 0.0 - 0.2 %   Neutrophils Relative % 91 %   Neutro Abs 12.9 (H) 1.7 - 7.7 K/uL   Lymphocytes Relative 3 %   Lymphs Abs 0.4 (L) 0.7 - 4.0 K/uL   Monocytes Relative 5 %   Monocytes Absolute 0.7 0.1 - 1.0 K/uL   Eosinophils Relative 0 %   Eosinophils Absolute 0.1 0.0 - 0.5 K/uL  Basophils Relative 0 %   Basophils Absolute 0.0 0.0 - 0.1 K/uL   Immature Granulocytes 1 %   Abs Immature Granulocytes 0.07 0.00 - 0.07 K/uL  CBG monitoring, ED  Result Value Ref Range   Glucose-Capillary 353 (H) 70 - 99 mg/dL  I-STAT 7, (LYTES, BLD GAS, ICA, H+H)  Result Value Ref Range   pH, Arterial 7.437 7.350 - 7.450   pCO2 arterial 37.0 32.0 - 48.0 mmHg   pO2, Arterial 78.0 (L) 83.0 - 108.0 mmHg   Bicarbonate 25.0 20.0 - 28.0 mmol/L   TCO2 26 22 - 32 mmol/L   O2 Saturation 96.0 %   Acid-Base Excess 1.0 0.0 - 2.0 mmol/L   Sodium 140 135 - 145 mmol/L   Potassium 3.8 3.5 - 5.1 mmol/L   Calcium, Ion 1.19 1.15 - 1.40 mmol/L   HCT 40.0 39.0 - 52.0 %   Hemoglobin 13.6 13.0 - 17.0 g/dL   Patient temperature 83.4 F    Collection site RADIAL, ALLEN'S TEST ACCEPTABLE    Drawn by RT    Sample type ARTERIAL    Dg Chest Port 1 View  Result Date: 05/07/2019 CLINICAL DATA:  Fever EXAM: PORTABLE CHEST 1 VIEW COMPARISON:  12/05/2018 FINDINGS: Mild cardiomegaly with pulmonary vascular congestion. No overt edema. No pneumothorax or sizable pleural effusion. No focal airspace consolidation. IMPRESSION: Mild cardiomegaly with pulmonary vascular congestion. Electronically Signed   By: Deatra Robinson M.D.   On: 05/07/2019 21:38    EKG EKG Interpretation  Date/Time:  Friday May 07 2019 20:26:47  EDT Ventricular Rate:  123 PR Interval:    QRS Duration: 92 QT Interval:  314 QTC Calculation: 450 R Axis:   85 Text Interpretation:  Sinus tachycardia Confirmed by Cathren Laine ([id number]) on 05/07/2019 8:29:26 PM   Radiology Dg Chest Port 1 View  Result Date: 05/07/2019 CLINICAL DATA:  Fever EXAM: PORTABLE CHEST 1 VIEW COMPARISON:  12/05/2018 FINDINGS: Mild cardiomegaly with pulmonary vascular congestion. No overt edema. No pneumothorax or sizable pleural effusion. No focal airspace consolidation. IMPRESSION: Mild cardiomegaly with pulmonary vascular congestion. Electronically Signed   By: Deatra Robinson M.D.   On: 05/07/2019 21:38    Procedures Procedures (including critical care time)  Medications Ordered in ED Medications - No data to display   Initial Impression / Assessment and Plan / ED Course  I have reviewed the triage vital signs and the nursing notes.  Pertinent labs & imaging results that were available during my care of the patient were reviewed by me and considered in my medical decision making (see chart for details).  Iv ns. CBG. Cultures and stat labs sent.   Reviewed nursing notes and prior charts for additional history.   Tried to call spouse - initially no answer.   Spouse called back - she indicates pt felt fine at 230 today, no complaints. Then developed chills, then had nv (not bloody or bilious), then c/o body aches, general malaise. No known ill contacts. Recent cardiac cath.   Iv abx ordered.   Labs reviewed by mel - lactate high, iv ns bolus. Iv abx given.  Chest xray reviewed by me - no definite pna.   Johnathan Keith was evaluated in Emergency Department on 05/07/2019 for the symptoms described in the history of present illness. He was evaluated in the context of the global COVID-19 pandemic, which necessitated consideration that the patient might be at risk for infection with the SARS-CoV-2 virus that causes COVID-19. Institutional protocols and  algorithms that pertain  to the evaluation of patients at risk for COVID-19 are in a state of rapid change based on information released by regulatory bodies including the CDC and federal and state organizations. These policies and algorithms were followed during the patient's care in the ED.  Recheck, pt alert, responds to questions. No headache. No numbness/weakness. abd soft nt.   Given fever, elevated lfts, will get u/s gb.   Medicine service consulted for admission re sepsis.   CRITICAL CARE  RE sepsis, altered ms, elevated lfts,   Performed by: Suzi RootsKevin E Hafsa Lohn Total critical care time: 40 minutes Critical care time was exclusive of separately billable procedures and treating other patients. Critical care was necessary to treat or prevent imminent or life-threatening deterioration. Critical care was time spent personally by me on the following activities: development of treatment plan with patient and/or surrogate as well as nursing, discussions with consultants, evaluation of patient's response to treatment, examination of patient, obtaining history from patient or surrogate, ordering and performing treatments and interventions, ordering and review of laboratory studies, ordering and review of radiographic studies, pulse oximetry and re-evaluation of patient's condition.  2330 u/s pending, and callback from admitting team pending, repeat lactate also pending - signed out to Dr Elesa MassedWard to check results, and facilitate admission.     Final Clinical Impressions(s) / ED Diagnoses   Final diagnoses:  None    ED Discharge Orders    None           Cathren LaineSteinl, Zephyr Sausedo, MD 05/07/19 2337

## 2019-05-07 NOTE — ED Notes (Signed)
Patient transported to Ultrasound 

## 2019-05-08 ENCOUNTER — Inpatient Hospital Stay (HOSPITAL_COMMUNITY): Payer: Medicare HMO

## 2019-05-08 ENCOUNTER — Encounter (HOSPITAL_COMMUNITY): Payer: Self-pay

## 2019-05-08 DIAGNOSIS — E1165 Type 2 diabetes mellitus with hyperglycemia: Secondary | ICD-10-CM | POA: Diagnosis present

## 2019-05-08 DIAGNOSIS — N179 Acute kidney failure, unspecified: Secondary | ICD-10-CM | POA: Diagnosis present

## 2019-05-08 DIAGNOSIS — E785 Hyperlipidemia, unspecified: Secondary | ICD-10-CM | POA: Diagnosis present

## 2019-05-08 DIAGNOSIS — A419 Sepsis, unspecified organism: Secondary | ICD-10-CM

## 2019-05-08 DIAGNOSIS — E872 Acidosis: Secondary | ICD-10-CM | POA: Diagnosis present

## 2019-05-08 DIAGNOSIS — R7401 Elevation of levels of liver transaminase levels: Secondary | ICD-10-CM

## 2019-05-08 DIAGNOSIS — R410 Disorientation, unspecified: Secondary | ICD-10-CM | POA: Diagnosis present

## 2019-05-08 DIAGNOSIS — R932 Abnormal findings on diagnostic imaging of liver and biliary tract: Secondary | ICD-10-CM | POA: Diagnosis not present

## 2019-05-08 DIAGNOSIS — K819 Cholecystitis, unspecified: Secondary | ICD-10-CM | POA: Diagnosis not present

## 2019-05-08 DIAGNOSIS — I2510 Atherosclerotic heart disease of native coronary artery without angina pectoris: Secondary | ICD-10-CM | POA: Diagnosis not present

## 2019-05-08 DIAGNOSIS — R945 Abnormal results of liver function studies: Secondary | ICD-10-CM | POA: Diagnosis not present

## 2019-05-08 DIAGNOSIS — E11319 Type 2 diabetes mellitus with unspecified diabetic retinopathy without macular edema: Secondary | ICD-10-CM | POA: Diagnosis present

## 2019-05-08 DIAGNOSIS — K859 Acute pancreatitis without necrosis or infection, unspecified: Secondary | ICD-10-CM

## 2019-05-08 DIAGNOSIS — E1151 Type 2 diabetes mellitus with diabetic peripheral angiopathy without gangrene: Secondary | ICD-10-CM | POA: Diagnosis present

## 2019-05-08 DIAGNOSIS — K851 Biliary acute pancreatitis without necrosis or infection: Secondary | ICD-10-CM | POA: Diagnosis present

## 2019-05-08 DIAGNOSIS — R17 Unspecified jaundice: Secondary | ICD-10-CM

## 2019-05-08 DIAGNOSIS — R740 Nonspecific elevation of levels of transaminase and lactic acid dehydrogenase [LDH]: Secondary | ICD-10-CM | POA: Diagnosis not present

## 2019-05-08 DIAGNOSIS — R509 Fever, unspecified: Secondary | ICD-10-CM

## 2019-05-08 DIAGNOSIS — B952 Enterococcus as the cause of diseases classified elsewhere: Secondary | ICD-10-CM | POA: Diagnosis present

## 2019-05-08 DIAGNOSIS — I351 Nonrheumatic aortic (valve) insufficiency: Secondary | ICD-10-CM | POA: Diagnosis not present

## 2019-05-08 DIAGNOSIS — R6520 Severe sepsis without septic shock: Secondary | ICD-10-CM | POA: Diagnosis not present

## 2019-05-08 DIAGNOSIS — K810 Acute cholecystitis: Secondary | ICD-10-CM | POA: Diagnosis not present

## 2019-05-08 DIAGNOSIS — Z955 Presence of coronary angioplasty implant and graft: Secondary | ICD-10-CM | POA: Diagnosis not present

## 2019-05-08 DIAGNOSIS — R1013 Epigastric pain: Secondary | ICD-10-CM

## 2019-05-08 DIAGNOSIS — E7800 Pure hypercholesterolemia, unspecified: Secondary | ICD-10-CM

## 2019-05-08 DIAGNOSIS — K8021 Calculus of gallbladder without cholecystitis with obstruction: Secondary | ICD-10-CM | POA: Diagnosis present

## 2019-05-08 DIAGNOSIS — A4159 Other Gram-negative sepsis: Secondary | ICD-10-CM | POA: Diagnosis present

## 2019-05-08 DIAGNOSIS — N390 Urinary tract infection, site not specified: Secondary | ICD-10-CM | POA: Diagnosis present

## 2019-05-08 DIAGNOSIS — G4733 Obstructive sleep apnea (adult) (pediatric): Secondary | ICD-10-CM | POA: Diagnosis present

## 2019-05-08 DIAGNOSIS — Z1159 Encounter for screening for other viral diseases: Secondary | ICD-10-CM | POA: Diagnosis not present

## 2019-05-08 DIAGNOSIS — R6521 Severe sepsis with septic shock: Secondary | ICD-10-CM | POA: Diagnosis present

## 2019-05-08 DIAGNOSIS — K8500 Idiopathic acute pancreatitis without necrosis or infection: Secondary | ICD-10-CM | POA: Diagnosis not present

## 2019-05-08 DIAGNOSIS — K8309 Other cholangitis: Secondary | ICD-10-CM | POA: Diagnosis present

## 2019-05-08 DIAGNOSIS — I248 Other forms of acute ischemic heart disease: Secondary | ICD-10-CM | POA: Diagnosis not present

## 2019-05-08 DIAGNOSIS — I69354 Hemiplegia and hemiparesis following cerebral infarction affecting left non-dominant side: Secondary | ICD-10-CM | POA: Diagnosis not present

## 2019-05-08 DIAGNOSIS — G92 Toxic encephalopathy: Secondary | ICD-10-CM | POA: Diagnosis present

## 2019-05-08 DIAGNOSIS — R140 Abdominal distension (gaseous): Secondary | ICD-10-CM | POA: Diagnosis not present

## 2019-05-08 DIAGNOSIS — K7200 Acute and subacute hepatic failure without coma: Secondary | ICD-10-CM | POA: Diagnosis not present

## 2019-05-08 DIAGNOSIS — Z833 Family history of diabetes mellitus: Secondary | ICD-10-CM | POA: Diagnosis not present

## 2019-05-08 DIAGNOSIS — E870 Hyperosmolality and hypernatremia: Secondary | ICD-10-CM | POA: Diagnosis present

## 2019-05-08 HISTORY — DX: Unspecified jaundice: R17

## 2019-05-08 HISTORY — DX: Sepsis, unspecified organism: A41.9

## 2019-05-08 HISTORY — DX: Fever, unspecified: R50.9

## 2019-05-08 HISTORY — DX: Acute pancreatitis without necrosis or infection, unspecified: K85.90

## 2019-05-08 HISTORY — DX: Elevation of levels of liver transaminase levels: R74.01

## 2019-05-08 LAB — RESPIRATORY PANEL BY PCR

## 2019-05-08 LAB — CBC
HCT: 38.6 % — ABNORMAL LOW (ref 39.0–52.0)
Hemoglobin: 12.5 g/dL — ABNORMAL LOW (ref 13.0–17.0)
MCH: 28.9 pg (ref 26.0–34.0)
MCHC: 32.4 g/dL (ref 30.0–36.0)
MCV: 89.4 fL (ref 80.0–100.0)
Platelets: 204 10*3/uL (ref 150–400)
RBC: 4.32 MIL/uL (ref 4.22–5.81)
RDW: 15.7 % — ABNORMAL HIGH (ref 11.5–15.5)
WBC: 17.8 10*3/uL — ABNORMAL HIGH (ref 4.0–10.5)
nRBC: 0 % (ref 0.0–0.2)

## 2019-05-08 LAB — BLOOD CULTURE ID PANEL (REFLEXED)

## 2019-05-08 LAB — PROCALCITONIN: Procalcitonin: 22.96 ng/mL

## 2019-05-08 LAB — BLOOD GAS, ARTERIAL
Acid-Base Excess: 0.5 mmol/L (ref 0.0–2.0)
Bicarbonate: 24 mmol/L (ref 20.0–28.0)
O2 Content: 3 L/min
O2 Saturation: 92.9 %
Patient temperature: 98.6
pCO2 arterial: 34.6 mmHg (ref 32.0–48.0)
pH, Arterial: 7.455 — ABNORMAL HIGH (ref 7.350–7.450)
pO2, Arterial: 71.8 mmHg — ABNORMAL LOW (ref 83.0–108.0)

## 2019-05-08 LAB — COMPREHENSIVE METABOLIC PANEL
ALT: 414 U/L — ABNORMAL HIGH (ref 0–44)
AST: 532 U/L — ABNORMAL HIGH (ref 15–41)
Albumin: 3 g/dL — ABNORMAL LOW (ref 3.5–5.0)
Alkaline Phosphatase: 75 U/L (ref 38–126)
Anion gap: 16 — ABNORMAL HIGH (ref 5–15)
BUN: 33 mg/dL — ABNORMAL HIGH (ref 6–20)
CO2: 21 mmol/L — ABNORMAL LOW (ref 22–32)
Calcium: 8.7 mg/dL — ABNORMAL LOW (ref 8.9–10.3)
Chloride: 104 mmol/L (ref 98–111)
Creatinine, Ser: 1.51 mg/dL — ABNORMAL HIGH (ref 0.61–1.24)
GFR calc Af Amer: 58 mL/min — ABNORMAL LOW (ref >60)
GFR calc non Af Amer: 50 mL/min — ABNORMAL LOW (ref >60)
Glucose, Bld: 395 mg/dL — ABNORMAL HIGH (ref 70–99)
Potassium: 3.8 mmol/L (ref 3.5–5.1)
Sodium: 141 mmol/L (ref 135–145)
Total Bilirubin: 3.5 mg/dL — ABNORMAL HIGH (ref 0.3–1.2)
Total Protein: 6.2 g/dL — ABNORMAL LOW (ref 6.5–8.1)

## 2019-05-08 LAB — AMMONIA: Ammonia: 18 umol/L (ref 9–35)

## 2019-05-08 LAB — GLUCOSE, CAPILLARY
Glucose-Capillary: 349 mg/dL — ABNORMAL HIGH (ref 70–99)
Glucose-Capillary: 353 mg/dL — ABNORMAL HIGH (ref 70–99)
Glucose-Capillary: 370 mg/dL — ABNORMAL HIGH (ref 70–99)
Glucose-Capillary: 290 mg/dL — ABNORMAL HIGH (ref 70–99)
Glucose-Capillary: 258 mg/dL — ABNORMAL HIGH (ref 70–99)

## 2019-05-08 LAB — LACTIC ACID, PLASMA
Lactic Acid, Venous: 3.2 mmol/L (ref 0.5–1.9)
Lactic Acid, Venous: 3.7 mmol/L (ref 0.5–1.9)
Lactic Acid, Venous: 4.1 mmol/L (ref 0.5–1.9)
Lactic Acid, Venous: 4.2 mmol/L (ref 0.5–1.9)
Lactic Acid, Venous: 3.1 mmol/L (ref 0.5–1.9)

## 2019-05-08 LAB — TSH: TSH: 1.714 u[IU]/mL (ref 0.350–4.500)

## 2019-05-08 LAB — LIPASE, BLOOD: Lipase: 1276 U/L — ABNORMAL HIGH (ref 11–51)

## 2019-05-08 IMAGING — CT CT HEAD WITHOUT CONTRAST
5 of 8 series · 17 of 47 positions shown, 18 images · non-contrast
Comparison: None.

CLINICAL DATA: Altered mental status.  Combative.

EXAM:
CT HEAD WITHOUT CONTRAST
TECHNIQUE: Contiguous axial images were obtained from the base of the skull
through the vertex without intravenous contrast.

[Series 3: head wo · axial · 0.48mm/px · z∈[-103,-38]mm · 2 of 40 slices shown, 3 images (1 of 2)]
[im 14/40  brain]
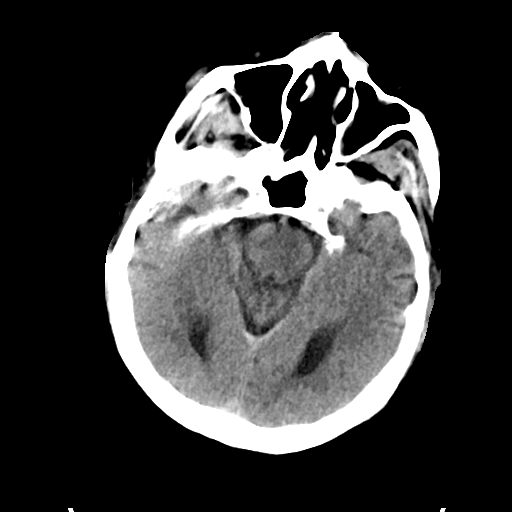
[im 14/40  bone]
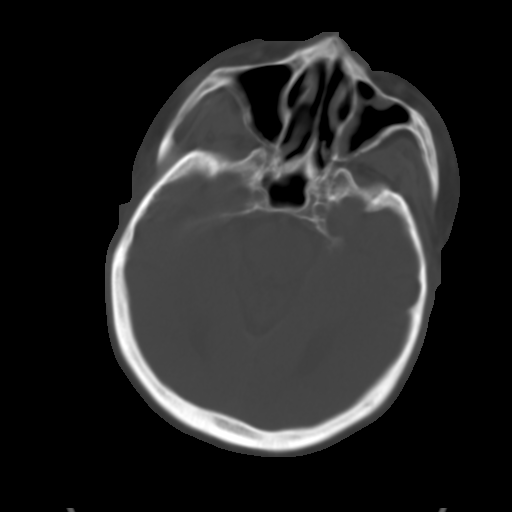
[im 27/40  brain]
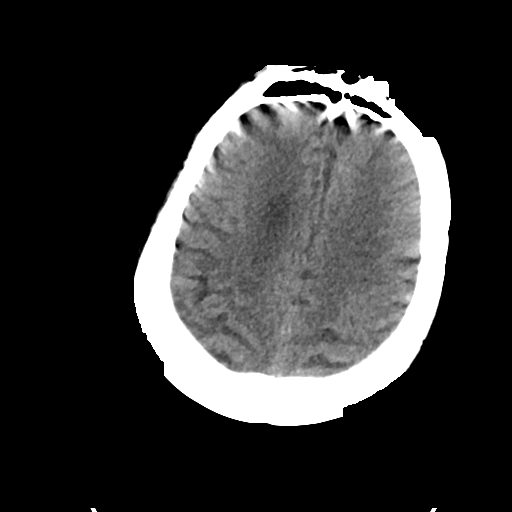

[Series 4: head bone · axial · 0.48mm/px · z∈[-150,+8]mm · 8 of 99 slices shown]
[im 10/99  bone]
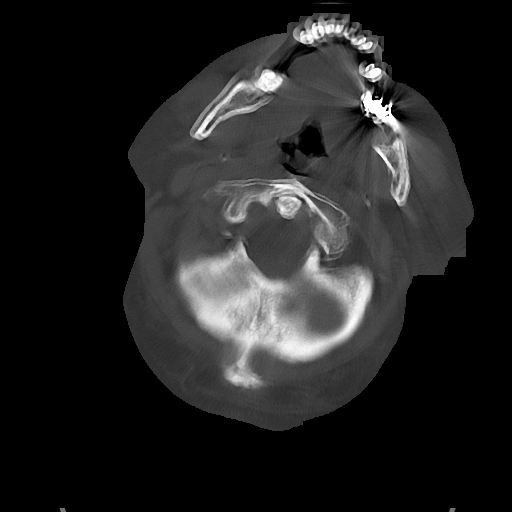
[im 20/99  bone]
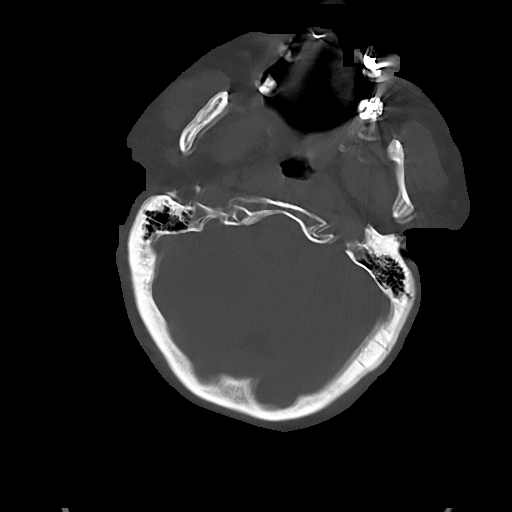
[im 30/99  bone]
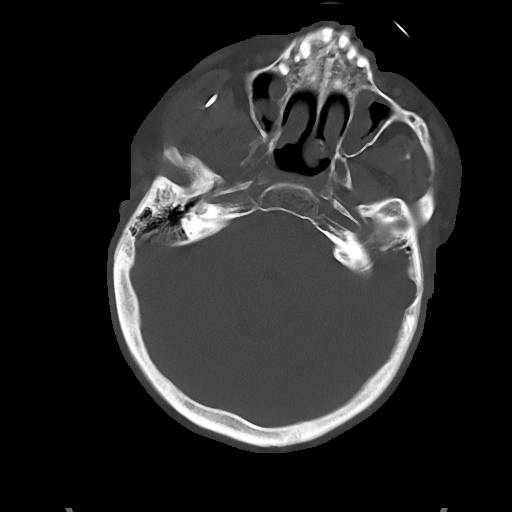
[im 40/99  bone]
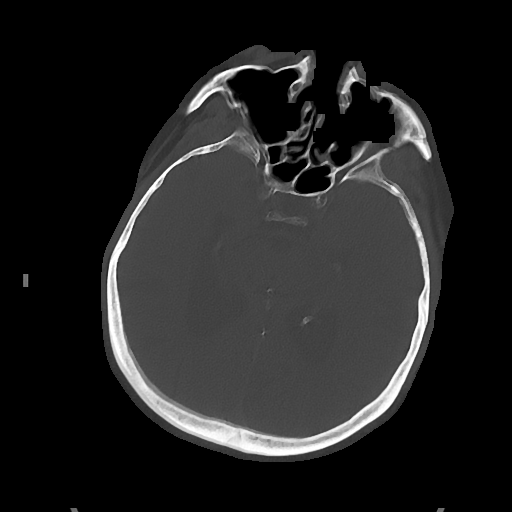
[im 59/99  bone]
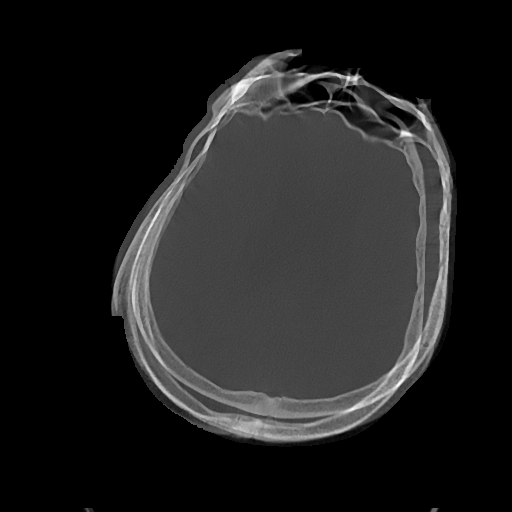
[im 69/99  bone]
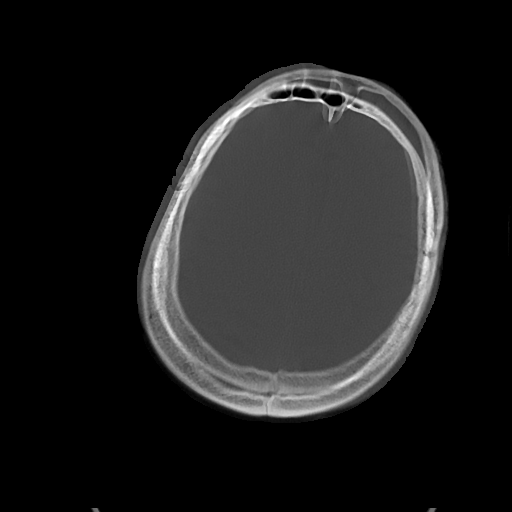
[im 79/99  bone]
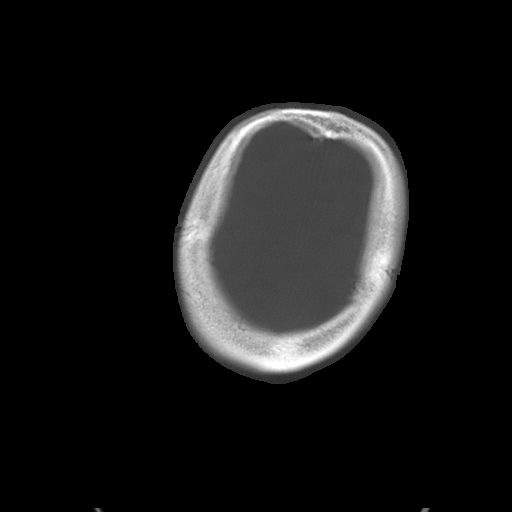
[im 89/99  bone]
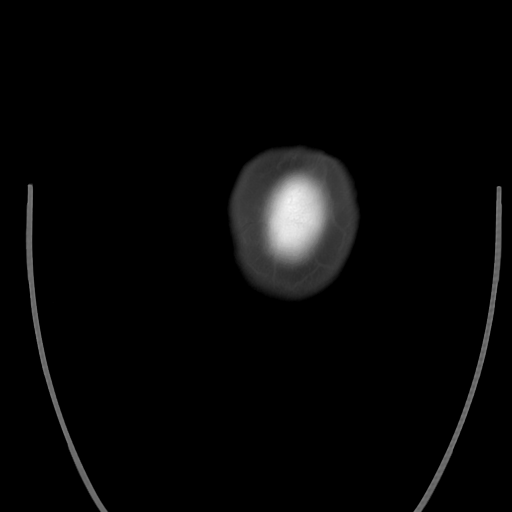

[Series 6: sag soft · sagittal · 0.38mm/px · 2 of 67 slices shown]
[im 23/67  brain]
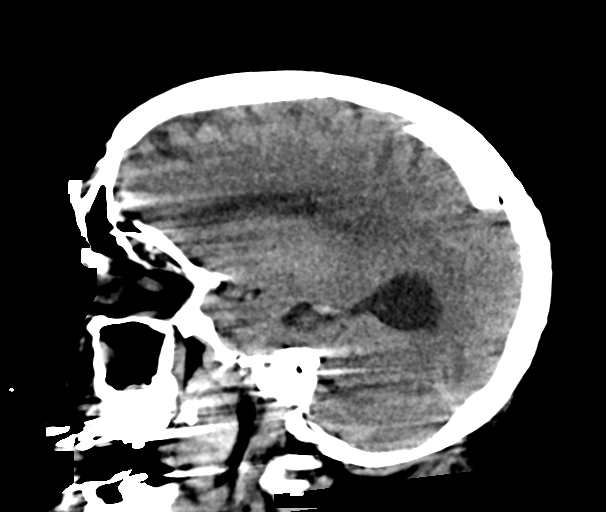
[im 45/67  brain]
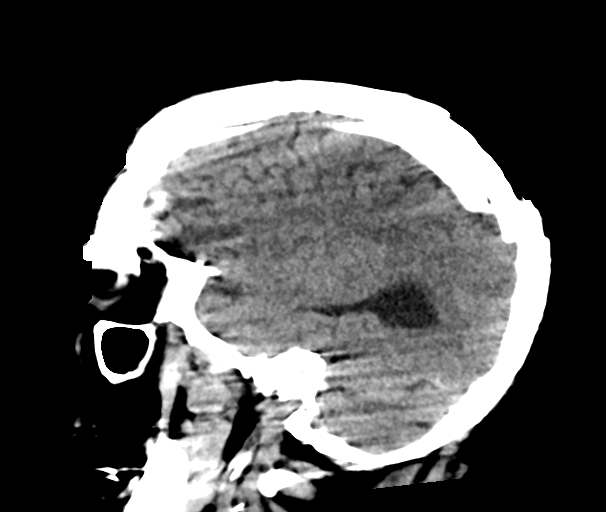

[Series 7: head wo · axial · 0.48mm/px · z∈[-103,-38]mm · 2 of 40 slices shown (2 of 2)]
[im 14/40  brain]
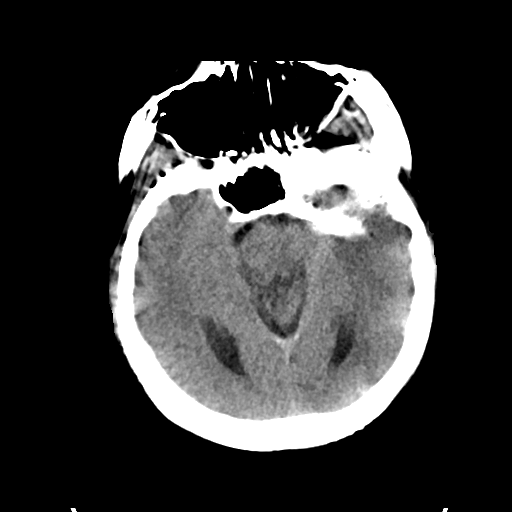
[im 27/40  brain]
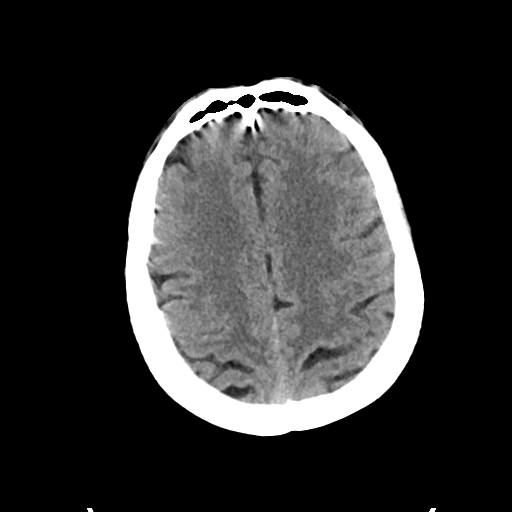

[Series 9: cor soft · coronal · 0.38mm/px · 3 of 72 slices shown]
[im 18/72  brain]
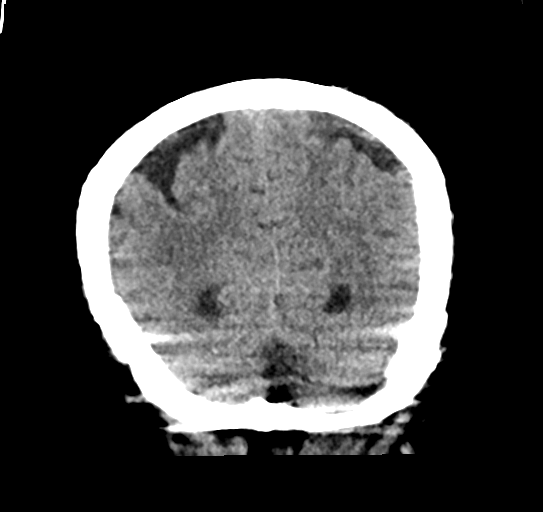
[im 36/72  brain]
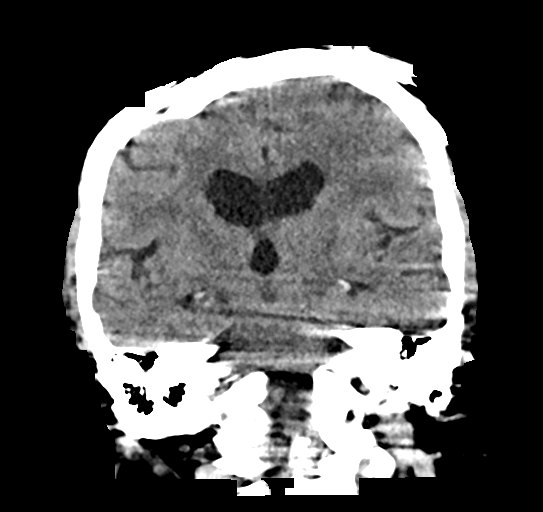
[im 54/72  brain]
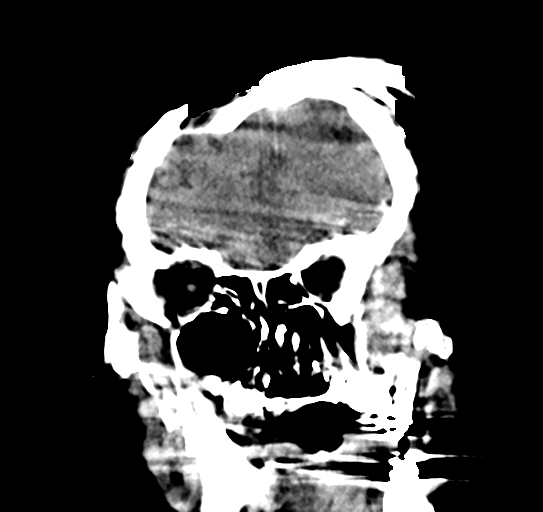

[17 of 47 positions shown; findings below may reference images not displayed]

FINDINGS: Brain: Motion degradation of the exam. Exam was repeated with
minimal improvement.

No gross evidence intracranial hemorrhage. No midline shift or mass
effect. Difficult to exclude cortical infarction with the degree of
motion. Basal cisterns are patent.

Vascular: No hyperdense vessel or unexpected calcification.

Skull: Normal. Negative for fracture or focal lesion.

Sinuses/Orbits: Paranasal sinuses and mastoid air cells are clear.
Orbits are clear.

Other: None.
IMPRESSION: Exam is extremely degraded by head motion. Exam was repeated with
little improvement. No gross abnormality.

## 2019-05-08 IMAGING — US ULTRASOUND ABDOMEN LIMITED
1 series · 4 of 4 positions shown · non-contrast
Comparison: None.

CLINICAL DATA: Abdominal distension.

EXAM:
LIMITED ABDOMEN ULTRASOUND FOR ASCITES
TECHNIQUE: Limited ultrasound survey for ascites was performed in all four
abdominal quadrants.

[Series 2: ultrasound abdomen limited · 4 of 4 slices shown]
[im 1/4]
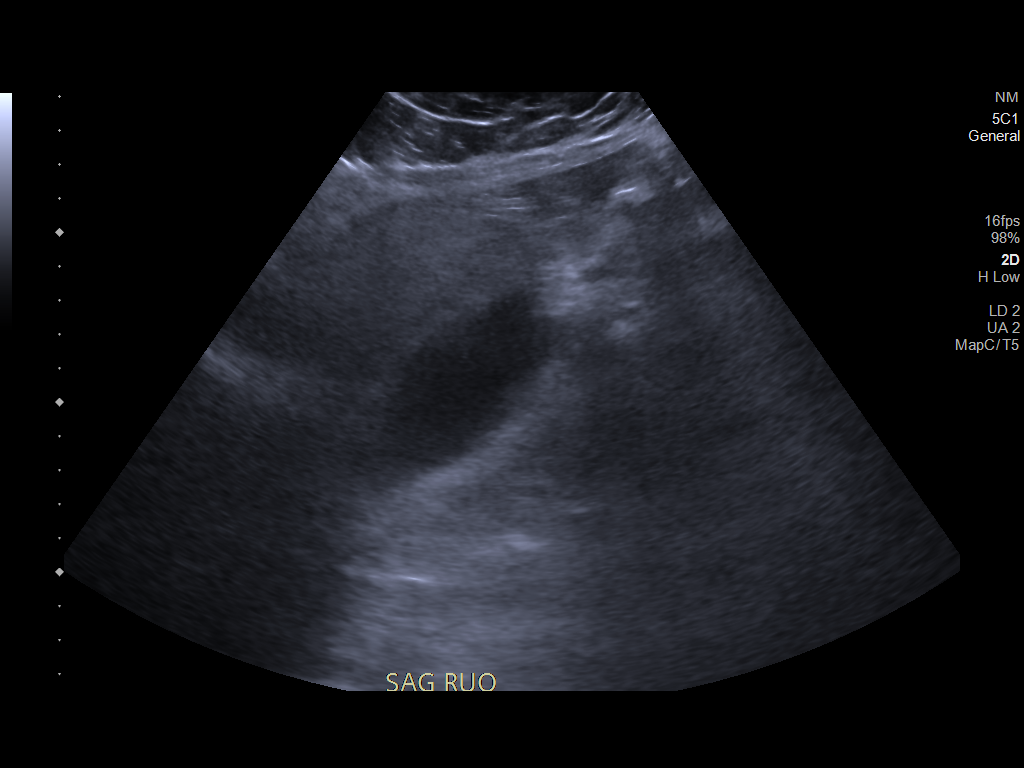
[im 2/4]
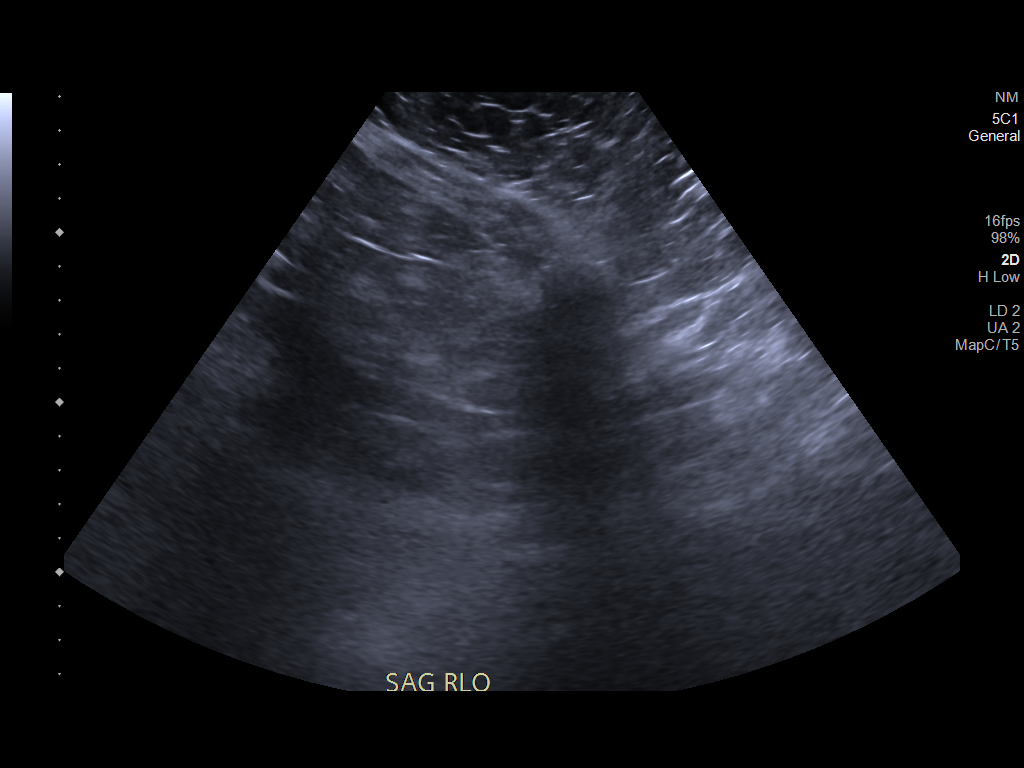
[im 3/4]
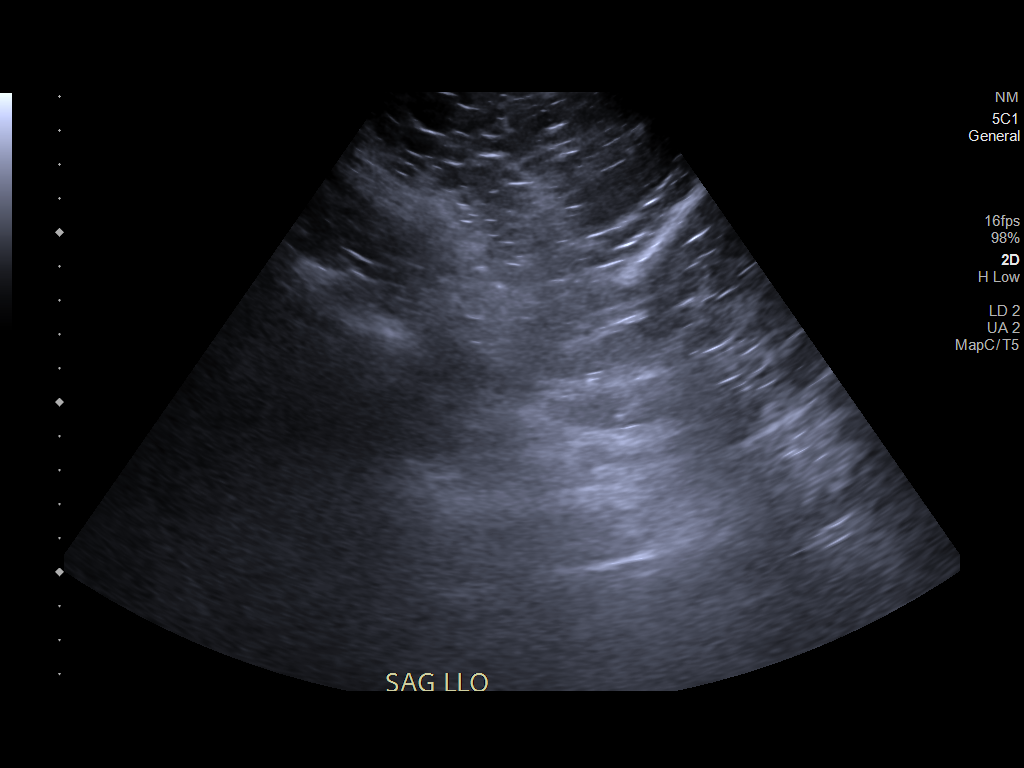
[im 4/4]
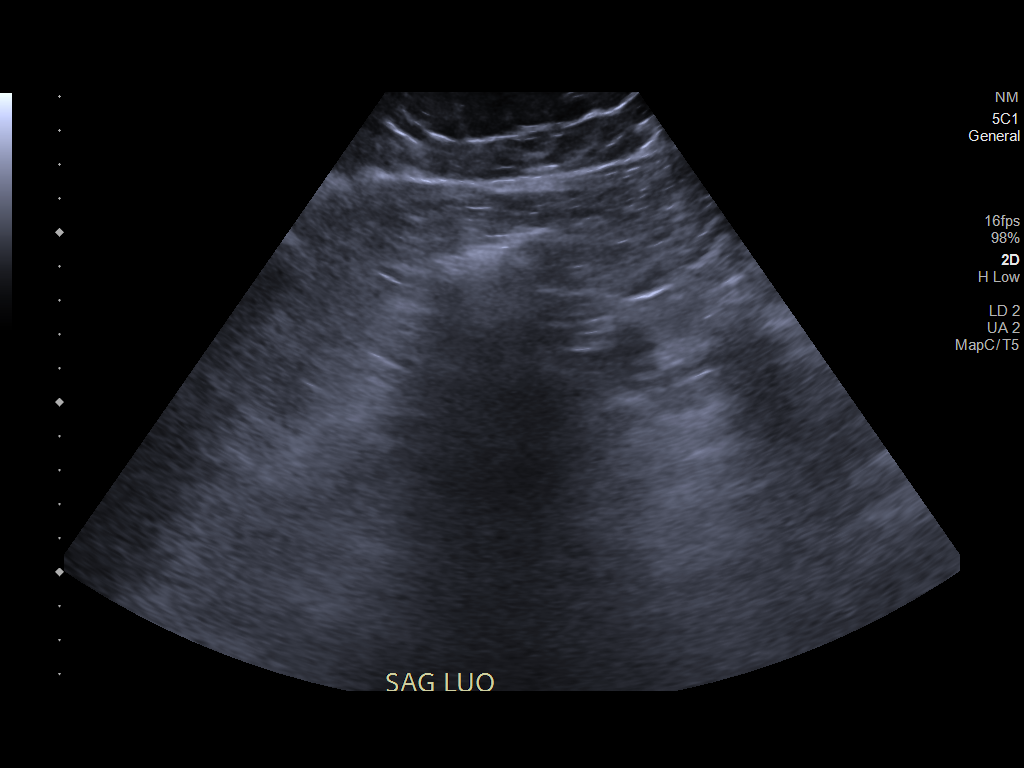

[4 of 4 positions shown; findings below may reference images not displayed]

FINDINGS: No ascites.
IMPRESSION: No ascites.

## 2019-05-08 IMAGING — DX DG ABDOMEN ACUTE W/ 1V CHEST
4 series · 4 of 4 positions shown · non-contrast
Comparison: [DATE]

CLINICAL DATA: Shortness of breath, abdominal distension

EXAM:
DG ABDOMEN ACUTE W/ 1V CHEST

[abdomen supine (1 of 2)]
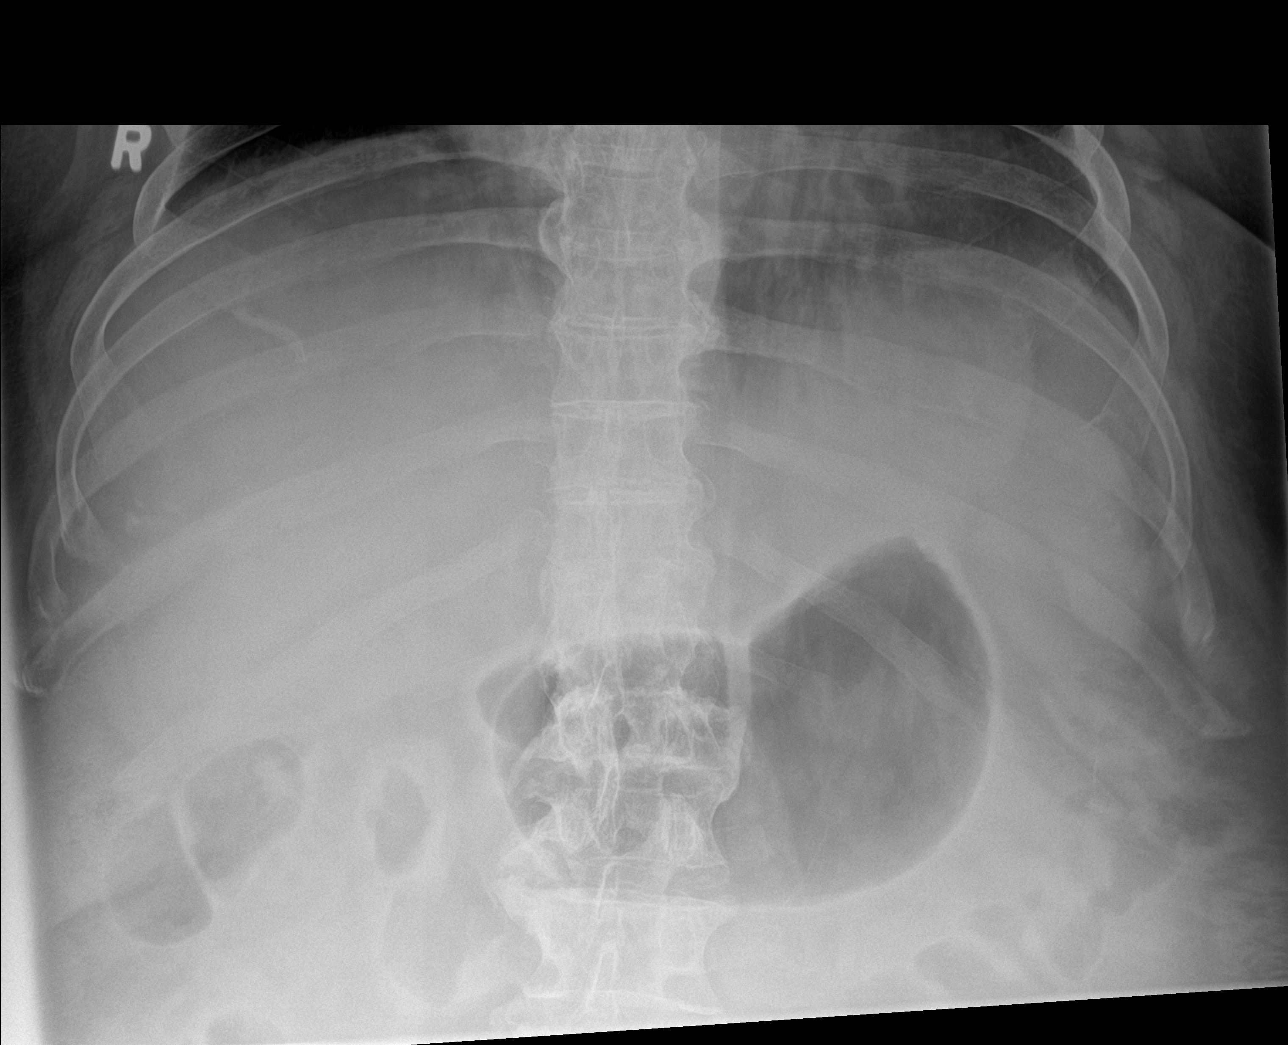

[abdomen supine (2 of 2)]
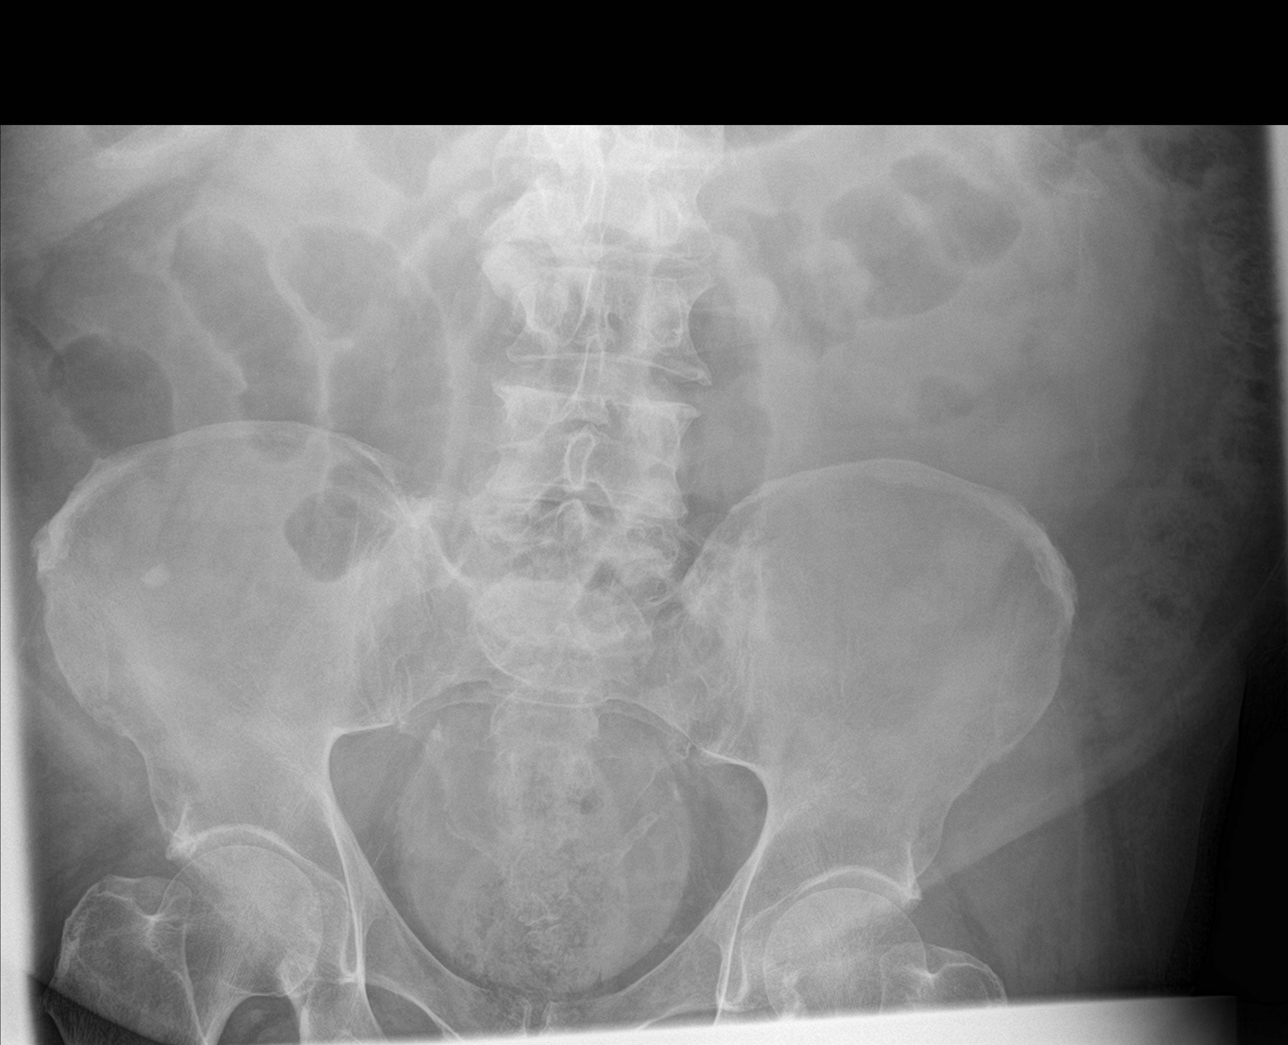

[abdomen decu]
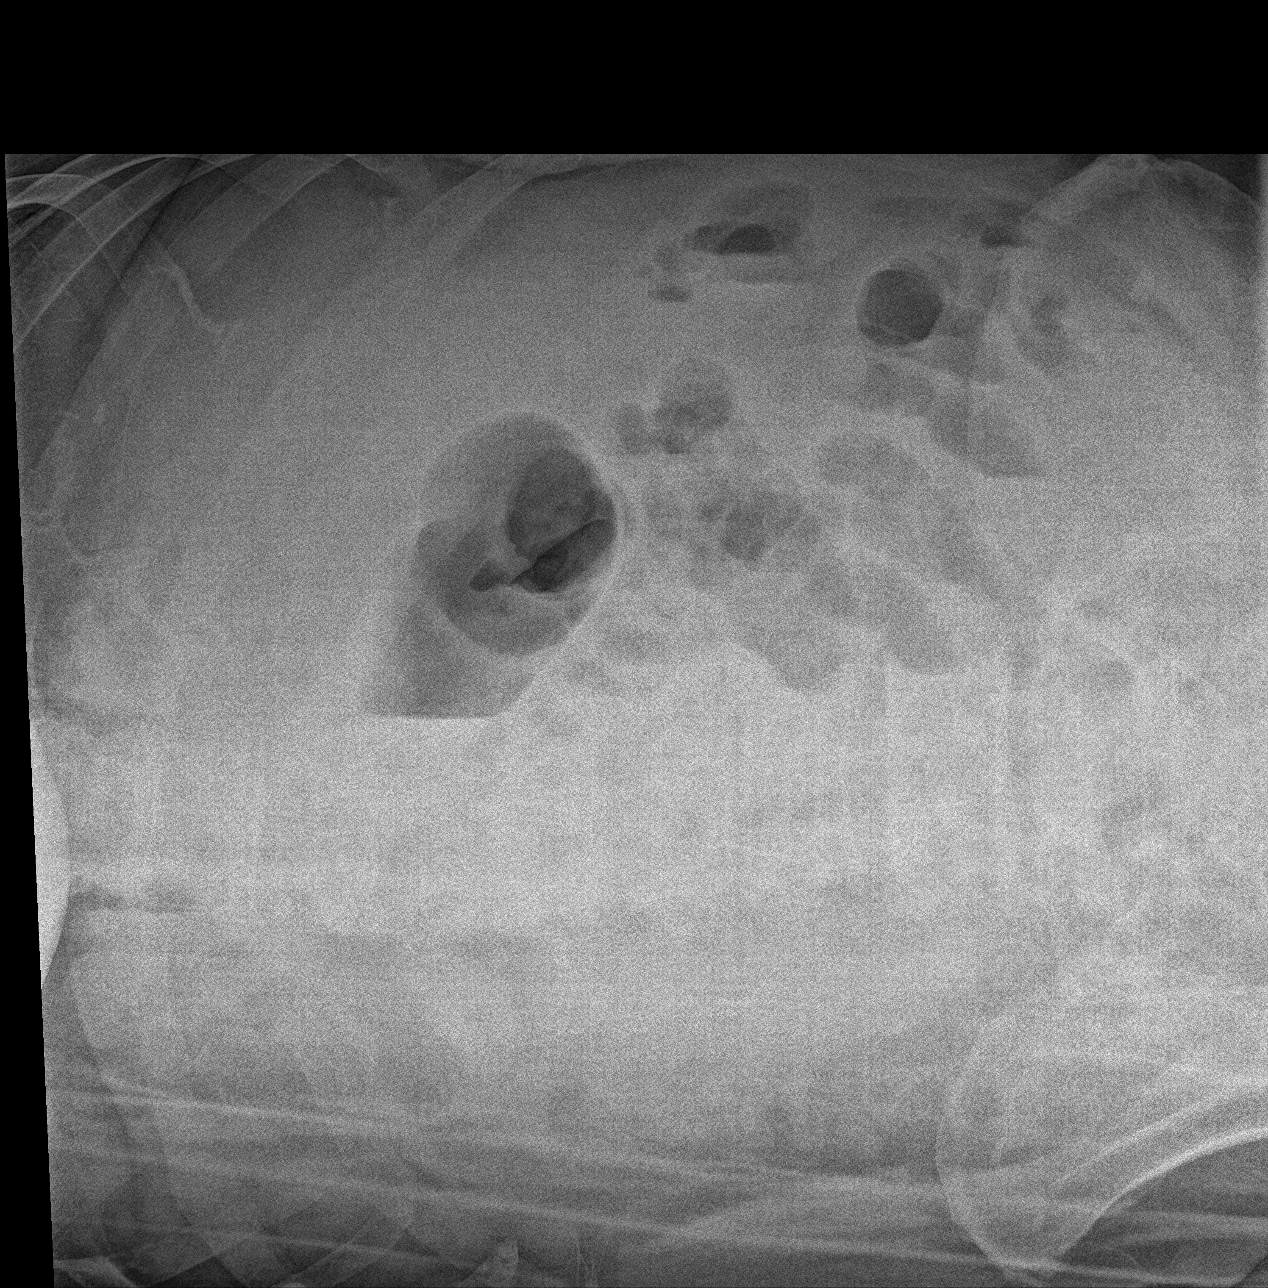

[chest ap]
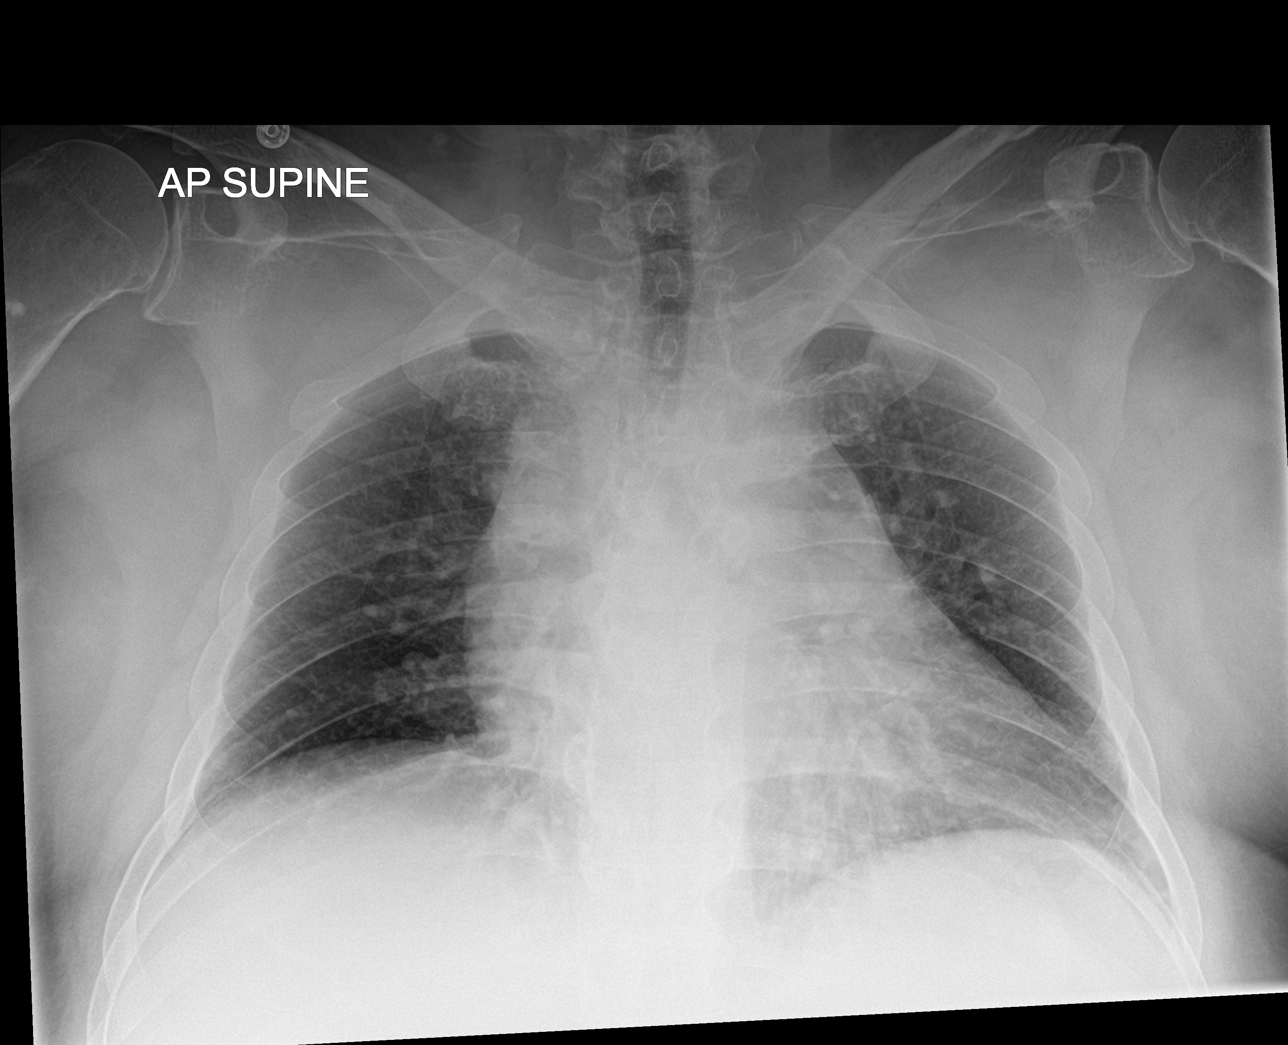

[4 of 4 positions shown; findings below may reference images not displayed]

FINDINGS: Lungs are clear.  No pleural effusion or pneumothorax.

The heart is normal in size.

Nonobstructive bowel gas pattern. No evidence of free air on the
lateral decubitus view.

Degenerative changes of the visualized thoracolumbar spine.
IMPRESSION: No evidence of acute cardiopulmonary disease.

No evidence of small bowel obstruction or free air.

## 2019-05-08 MED ORDER — ACETAMINOPHEN 650 MG RE SUPP
650.0000 mg | Freq: Four times a day (QID) | RECTAL | Status: DC | PRN
  Administered 2019-05-08 – 2019-05-09 (×2): 650 mg via RECTAL
  Filled 2019-05-08 (×2): qty 1

## 2019-05-08 MED ORDER — ACETAMINOPHEN 650 MG RE SUPP
650.0000 mg | Freq: Once | RECTAL | Status: AC
  Administered 2019-05-08: 650 mg via RECTAL
  Filled 2019-05-08: qty 1

## 2019-05-08 MED ORDER — POLYETHYLENE GLYCOL 3350 17 G PO PACK: 17.0000 g | PACK | Freq: Every day | ORAL | Status: DC | PRN

## 2019-05-08 MED ORDER — SODIUM CHLORIDE 0.9 % IV BOLUS: 500.0000 mL | Freq: Once | INTRAVENOUS | Status: AC

## 2019-05-08 MED ORDER — ONDANSETRON HCL 4 MG PO TABS: 4.0000 mg | ORAL_TABLET | Freq: Four times a day (QID) | ORAL | Status: DC | PRN

## 2019-05-08 MED ORDER — INSULIN ASPART 100 UNIT/ML ~~LOC~~ SOLN: 0.0000 [IU] | Freq: Every day | SUBCUTANEOUS | Status: DC

## 2019-05-08 MED ORDER — FUROSEMIDE 10 MG/ML IJ SOLN
80.0000 mg | Freq: Once | INTRAMUSCULAR | Status: AC
  Administered 2019-05-08: 80 mg via INTRAVENOUS
  Filled 2019-05-08: qty 8

## 2019-05-08 MED ORDER — ONDANSETRON HCL 4 MG/2ML IJ SOLN
4.0000 mg | Freq: Four times a day (QID) | INTRAMUSCULAR | Status: DC | PRN
  Administered 2019-05-13 – 2019-05-16 (×2): 4 mg via INTRAVENOUS
  Filled 2019-05-08 (×2): qty 2

## 2019-05-08 MED ORDER — SENNOSIDES-DOCUSATE SODIUM 8.6-50 MG PO TABS: 2.0000 | ORAL_TABLET | Freq: Two times a day (BID) | ORAL | Status: DC

## 2019-05-08 MED ORDER — INSULIN ASPART 100 UNIT/ML ~~LOC~~ SOLN
0.0000 [IU] | Freq: Three times a day (TID) | SUBCUTANEOUS | Status: DC
  Administered 2019-05-08: 15 [IU] via SUBCUTANEOUS
  Administered 2019-05-08: 8 [IU] via SUBCUTANEOUS
  Administered 2019-05-08 – 2019-05-09 (×2): 15 [IU] via SUBCUTANEOUS
  Administered 2019-05-09: 8 [IU] via SUBCUTANEOUS
  Administered 2019-05-09: 11 [IU] via SUBCUTANEOUS
  Administered 2019-05-10 (×3): 5 [IU] via SUBCUTANEOUS
  Administered 2019-05-11: 3 [IU] via SUBCUTANEOUS
  Administered 2019-05-11: 2 [IU] via SUBCUTANEOUS
  Administered 2019-05-11: 3 [IU] via SUBCUTANEOUS
  Administered 2019-05-12: 5 [IU] via SUBCUTANEOUS
  Administered 2019-05-12: 3 [IU] via SUBCUTANEOUS
  Administered 2019-05-12: 5 [IU] via SUBCUTANEOUS
  Administered 2019-05-13 (×2): 3 [IU] via SUBCUTANEOUS
  Administered 2019-05-13: 12:00:00 8 [IU] via SUBCUTANEOUS
  Administered 2019-05-14 – 2019-05-15 (×3): 3 [IU] via SUBCUTANEOUS
  Administered 2019-05-16 (×2): 2 [IU] via SUBCUTANEOUS

## 2019-05-08 MED ORDER — SENNOSIDES-DOCUSATE SODIUM 8.6-50 MG PO TABS: 2.0000 | ORAL_TABLET | Freq: Every evening | ORAL | Status: DC | PRN

## 2019-05-08 MED ORDER — SODIUM CHLORIDE 0.9 % IV SOLN
INTRAVENOUS | Status: DC
  Administered 2019-05-08: 01:00:00 via INTRAVENOUS

## 2019-05-08 MED ORDER — INSULIN ASPART 100 UNIT/ML ~~LOC~~ SOLN
0.0000 [IU] | Freq: Every day | SUBCUTANEOUS | Status: DC
  Administered 2019-05-08: 4 [IU] via SUBCUTANEOUS
  Administered 2019-05-08 – 2019-05-10 (×2): 3 [IU] via SUBCUTANEOUS

## 2019-05-08 MED ORDER — ALBUTEROL SULFATE (2.5 MG/3ML) 0.083% IN NEBU
2.5000 mg | INHALATION_SOLUTION | Freq: Four times a day (QID) | RESPIRATORY_TRACT | Status: DC | PRN
  Administered 2019-05-08 – 2019-05-10 (×3): 2.5 mg via RESPIRATORY_TRACT
  Filled 2019-05-08 (×5): qty 3

## 2019-05-08 MED ORDER — INSULIN ASPART 100 UNIT/ML ~~LOC~~ SOLN: 3.0000 [IU] | Freq: Three times a day (TID) | SUBCUTANEOUS | Status: DC

## 2019-05-08 MED ORDER — INSULIN ASPART 100 UNIT/ML ~~LOC~~ SOLN: 0.0000 [IU] | Freq: Three times a day (TID) | SUBCUTANEOUS | Status: DC

## 2019-05-08 MED ORDER — ASPIRIN EC 81 MG PO TBEC
81.0000 mg | DELAYED_RELEASE_TABLET | Freq: Every day | ORAL | Status: DC
  Filled 2019-05-08: qty 1

## 2019-05-08 MED ORDER — HYDRALAZINE HCL 20 MG/ML IJ SOLN
10.0000 mg | INTRAMUSCULAR | Status: DC | PRN
  Administered 2019-05-12 – 2019-05-13 (×3): 10 mg via INTRAVENOUS
  Filled 2019-05-08 (×3): qty 1

## 2019-05-08 MED ORDER — BISACODYL 10 MG RE SUPP
10.0000 mg | Freq: Four times a day (QID) | RECTAL | Status: DC
  Administered 2019-05-08: 10 mg via RECTAL
  Filled 2019-05-08 (×2): qty 1

## 2019-05-08 MED ORDER — METRONIDAZOLE IN NACL 5-0.79 MG/ML-% IV SOLN
500.0000 mg | Freq: Three times a day (TID) | INTRAVENOUS | Status: DC
  Administered 2019-05-08 – 2019-05-12 (×12): 500 mg via INTRAVENOUS
  Filled 2019-05-08 (×11): qty 100

## 2019-05-08 MED ORDER — IBUPROFEN 400 MG PO TABS: 400.0000 mg | ORAL_TABLET | Freq: Once | ORAL | Status: DC

## 2019-05-08 MED ORDER — SODIUM CHLORIDE 0.9 % IV BOLUS
500.0000 mL | Freq: Once | INTRAVENOUS | Status: AC
  Administered 2019-05-08: 500 mL via INTRAVENOUS

## 2019-05-08 MED ORDER — INSULIN DETEMIR 100 UNIT/ML ~~LOC~~ SOLN
10.0000 [IU] | Freq: Every day | SUBCUTANEOUS | Status: DC
  Administered 2019-05-08: 10 [IU] via SUBCUTANEOUS
  Filled 2019-05-08 (×2): qty 0.1

## 2019-05-08 MED ORDER — ALBUTEROL SULFATE HFA 108 (90 BASE) MCG/ACT IN AERS: 1.0000 | INHALATION_SPRAY | Freq: Four times a day (QID) | RESPIRATORY_TRACT | Status: DC | PRN

## 2019-05-08 MED ORDER — ACETAMINOPHEN 325 MG PO TABS: 650.0000 mg | ORAL_TABLET | Freq: Four times a day (QID) | ORAL | Status: DC | PRN

## 2019-05-08 MED ORDER — LORAZEPAM 2 MG/ML IJ SOLN
0.5000 mg | Freq: Once | INTRAMUSCULAR | Status: AC
  Administered 2019-05-08: 0.5 mg via INTRAVENOUS
  Filled 2019-05-08: qty 1

## 2019-05-08 NOTE — Progress Notes (Signed)
PROGRESS NOTE    Johnathan Keith  RTM:[id number] DOB: 1961-11-01 DOA: 05/07/2019 PCP: Shelle Iron, MD   Brief Narrative:  58 year old with past medical history of diabetes mellitus type 2, hyperlipidemia, coronary artery disease, essential hypertension, CVA was brought to the hospital for concerns of fever, weakness and confusion at home.  Upon admission he was found to be febrile with significantly elevated LFTs, lipase level and total bilirubin.  His lactate level were quite elevated therefore was given aggressive IV fluids.   Assessment & Plan:   Principal Problem:   Sepsis (HCC) Active Problems:   Type 2 diabetes mellitus with vascular disease (HCC)   Hyperlipidemia   Essential hypertension   CAD -S/P PCI-2016   History of stroke   Fever   Transaminitis   Serum total bilirubin elevated   Acute pancreatitis  Sepsis secondary to intra-abdominal infection Acute gallstone pancreatitis with transaminitis Abdominal distention - We will keep the patient n.p.o., will like to stop IV fluids given his respiratory status.  Pain control.  Trend LFTs.  Once medically stable from respiratory standpoint, he will need MRCP.  GI team is following. -Continue IV antibiotics vancomycin and cefepime - Chest x-ray and abdominal x-ray-negative for acute pathology. -Check ammonia level, TSH. -UA is negative -No focal neuro deficits at this time but if his mentation remains in question, will need CT of the head with contrast.  Acute respiratory distress with hypoxia - Suspect this is from fluid overload from aggressive IV fluids in the setting of lactic acidosis and sepsis.  Check ABG.  We will stop IV fluids.  Given 80 mg of IV Lasix.  Chest x-ray and abdominal x-ray done which was negative for acute pathology.  Transfer the patient to the stepdown unit for closer monitoring.  History of coronary artery disease status post PCI -Continue aspirin.  Plavix and statin on hold.  Plavix on hold in  anticipation for intra-abdominal procedure possibly ERCP and statin on hold due to transaminitis.  Diabetes mellitus type 2 - Insulin sliding scale and Accu-Chek.  Levemir 10 units daily and uptitrate as needed.   DVT prophylaxis: SCDs Code Status: Full Family Communication:  Spoke with the patients Wife over the phone Disposition Plan: Transfer patient to the Progressive Care Unit.   Consultants:   Gastroenterology   Procedures:   None so Far  Antimicrobials:   Vanc 5/30  Cefepime 5/30   Subjective: Patient seen and examined at the bedside. Patient appeared somewhat short of breath and confused at bedside. Unable to give me a full hx. He has been getting IVF overnight due to sepsis with elevated Lactic acid.   Review of Systems Otherwise negative except as per HPI, including: General: Denies fever, chills, night sweats or unintended weight loss. Resp: Denies wheezing Cardiac: Denies chest pain, palpitations, orthopnea, paroxysmal nocturnal dyspnea. GI: Denies  nausea, vomiting, diarrhea or constipation GU: Denies dysuria, frequency, hesitancy or incontinence MS: Denies muscle aches, joint pain or swelling Neuro: Denies headache, neurologic deficits (focal weakness, numbness, tingling), abnormal gait Psych: Denies anxiety, depression, SI/HI/AVH Skin: Denies new rashes or lesions ID: Denies sick contacts, exotic exposures, travel  Objective: Vitals:   05/08/19 0028 05/08/19 0059 05/08/19 0445 05/08/19 0856  BP: 101/89 123/70 130/68 136/71  Pulse:  (!) 110 (!) 110 90  Resp:    18  Temp:  98.6 F (37 C) 98.9 F (37.2 C) 98 F (36.7 C)  TempSrc:  Oral Oral Oral  SpO2:  97% 94% 96%  Weight:  115.6  kg    Height:        Intake/Output Summary (Last 24 hours) at 05/08/2019 1148 Last data filed at 05/08/2019 0857 Gross per 24 hour  Intake 1669.56 ml  Output 925 ml  Net 744.56 ml   Filed Weights   05/07/19 2037 05/08/19 0059  Weight: 113.4 kg 115.6 kg     Examination:  General exam: Alert to his name, slightly uncomfortable due to respiratory distress Respiratory system: Bilateral diminished breath sounds with crackles at the bases Cardiovascular system: S1 & S2 heard, RRR. No JVD, murmurs, rubs, gallops or clicks. No pedal edema. Gastrointestinal system: Abdomen is ndistended, soft and nontender. No organomegaly or masses felt. Normal bowel sounds heard. Central nervous system: Alert and oriented. No focal neurological deficits. Extremities: Symmetric 4 x 5 power. Skin: No rashes, lesions or ulcers Psychiatry: Judgement and insight appear poor     Data Reviewed:   CBC: Recent Labs  Lab 05/07/19 2052 05/07/19 2104 05/08/19 0513  WBC 14.2*  --  17.8*  NEUTROABS 12.9*  --   --   HGB 14.1 13.6 12.5*  HCT 44.2 40.0 38.6*  MCV 91.5  --  89.4  PLT 204  --  204   Basic Metabolic Panel: Recent Labs  Lab 05/07/19 2052 05/07/19 2104 05/08/19 0513  NA 139 140 141  K 3.8 3.8 3.8  CL 103  --  104  CO2 24  --  21*  GLUCOSE 373*  --  395*  BUN 30*  --  33*  CREATININE 1.29*  --  1.51*  CALCIUM 9.0  --  8.7*   GFR: Estimated Creatinine Clearance: 63 mL/min (A) (by C-G formula based on SCr of 1.51 mg/dL (H)). Liver Function Tests: Recent Labs  Lab 05/07/19 2052 05/08/19 0513  AST 309* 532*  ALT 194* 414*  ALKPHOS 86 75  BILITOT 2.3* 3.5*  PROT 6.3* 6.2*  ALBUMIN 3.3* 3.0*   Recent Labs  Lab 05/07/19 0000  LIPASE 1,276*   No results for input(s): AMMONIA in the last 168 hours. Coagulation Profile: No results for input(s): INR, PROTIME in the last 168 hours. Cardiac Enzymes: No results for input(s): CKTOTAL, CKMB, CKMBINDEX, TROPONINI in the last 168 hours. BNP (last 3 results) No results for input(s): PROBNP in the last 8760 hours. HbA1C: No results for input(s): HGBA1C in the last 72 hours. CBG: Recent Labs  Lab 05/07/19 2033 05/08/19 0102 05/08/19 0746 05/08/19 1111  GLUCAP 353* 349* 353* 370*   Lipid  Profile: No results for input(s): CHOL, HDL, LDLCALC, TRIG, CHOLHDL, LDLDIRECT in the last 72 hours. Thyroid Function Tests: No results for input(s): TSH, T4TOTAL, FREET4, T3FREE, THYROIDAB in the last 72 hours. Anemia Panel: No results for input(s): VITAMINB12, FOLATE, FERRITIN, TIBC, IRON, RETICCTPCT in the last 72 hours. Sepsis Labs: Recent Labs  Lab 05/07/19 2052 05/07/19 2249 05/08/19 0205 05/08/19 0513  LATICACIDVEN 3.9* 3.2* 3.7* 4.1*    Recent Results (from the past 240 hour(s))  SARS Coronavirus 2 (CEPHEID- Performed in [facility] hospital lab), Hosp Order     Status: None   Collection Time: 05/07/19  9:26 PM  Result Value Ref Range Status   SARS Coronavirus 2 NEGATIVE NEGATIVE Final    Comment: (NOTE) If result is NEGATIVE SARS-CoV-2 target nucleic acids are NOT DETECTED. The SARS-CoV-2 RNA is generally detectable in upper and lower  respiratory specimens during the acute phase of infection. The lowest  concentration of SARS-CoV-2 viral copies this assay can detect is 250  copies /  mL. A negative result does not preclude SARS-CoV-2 infection  and should not be used as the sole basis for treatment or other  patient management decisions.  A negative result may occur with  improper specimen collection / handling, submission of specimen other  than nasopharyngeal swab, presence of viral mutation(s) within the  areas targeted by this assay, and inadequate number of viral copies  (<250 copies / mL). A negative result must be combined with clinical  observations, patient history, and epidemiological information. If result is POSITIVE SARS-CoV-2 target nucleic acids are DETECTED. The SARS-CoV-2 RNA is generally detectable in upper and lower  respiratory specimens dur ing the acute phase of infection.  Positive  results are indicative of active infection with SARS-CoV-2.  Clinical  correlation with patient history and other diagnostic information is  necessary to determine  patient infection status.  Positive results do  not rule out bacterial infection or co-infection with other viruses. If result is PRESUMPTIVE POSTIVE SARS-CoV-2 nucleic acids MAY BE PRESENT.   A presumptive positive result was obtained on the submitted specimen  and confirmed on repeat testing.  While 2019 novel coronavirus  (SARS-CoV-2) nucleic acids may be present in the submitted sample  additional confirmatory testing may be necessary for epidemiological  and / or clinical management purposes  to differentiate between  SARS-CoV-2 and other Sarbecovirus currently known to infect humans.  If clinically indicated additional testing with an alternate test  methodology [phone]) is advised. The SARS-CoV-2 RNA is generally  detectable in upper and lower respiratory sp ecimens during the acute  phase of infection. The expected result is Negative. Fact Sheet for Patients:  BoilerBrush.com.cy Fact Sheet for Healthcare Providers: https://pope.com/ This test is not yet approved or cleared by the [place] FDA and has been authorized for detection and/or diagnosis of SARS-CoV-2 by FDA under an Emergency Use Authorization (EUA).  This EUA will remain in effect (meaning this test can be used) for the duration of the COVID-19 declaration under Section 564(b)(1) of the Act, 21 U.S.C. section 360bbb-3(b)(1), unless the authorization is terminated or revoked sooner. Performed at [facility], [address], [place], [place] [address]          Radiology Studies: US Abdomen Limited  Result Date: 05/08/2019 CLINICAL DATA:  Initial evaluation for acute fever, elevated LFTs and bilirubin. EXAM: ULTRASOUND ABDOMEN LIMITED RIGHT UPPER QUADRANT COMPARISON:  Prior CT from 01/19/2015 FINDINGS: Gallbladder: 9 mm echogenic focus at the gallbladder neck could reflect a small stone versus tumefactive sludge. Gallbladder wall measured at the upper  limits of normal at 3 mm. No free pericholecystic fluid. No sonographic Murphy sign elicited on exam. Common bile duct: Diameter: 3.7 mm Liver: No focal lesion identified. Diffusely increased echogenicity within the hepatic parenchyma. Question of fine nodularity of the hepatic contour. Portal vein is patent on color Doppler imaging with normal direction of blood flow towards the liver. IMPRESSION: 1. 9 mm stone versus tumefactive sludge within the gallbladder neck. No sonographic features to suggest acute cholecystitis. 2. No biliary dilatation. 3. Increased echogenicity within the hepatic parenchyma with question of subtle nodularity of the hepatic contour, which could reflect sequelae of cirrhosis and/or steatosis. Electronically Signed   By: Rise Mu M.D.   On: 05/08/2019 00:16   Dg Chest Port 1 View  Result Date: 05/07/2019 CLINICAL DATA:  Fever EXAM: PORTABLE CHEST 1 VIEW COMPARISON:  12/05/2018 FINDINGS: Mild cardiomegaly with pulmonary vascular congestion. No overt edema. No pneumothorax or sizable pleural effusion.  No focal airspace consolidation. IMPRESSION: Mild cardiomegaly with pulmonary vascular congestion. Electronically Signed   By: Deatra Robinson M.D.   On: 05/07/2019 21:38   Dg Abd Acute 2+v W 1v Chest  Result Date: 05/08/2019 CLINICAL DATA:  Shortness of breath, abdominal distension EXAM: DG ABDOMEN ACUTE W/ 1V CHEST COMPARISON:  05/07/2019 FINDINGS: Lungs are clear.  No pleural effusion or pneumothorax. The heart is normal in size. Nonobstructive bowel gas pattern. No evidence of free air on the lateral decubitus view. Degenerative changes of the visualized thoracolumbar spine. IMPRESSION: No evidence of acute cardiopulmonary disease. No evidence of small bowel obstruction or free air. Electronically Signed   By: Charline Bills M.D.   On: 05/08/2019 11:18        Scheduled Meds: . aspirin EC  81 mg Oral Daily  . insulin aspart  0-15 Units Subcutaneous TID WC  .  insulin aspart  0-5 Units Subcutaneous QHS  . insulin detemir  10 Units Subcutaneous Daily   Continuous Infusions: . ceFEPime (MAXIPIME) IV 2 g (05/08/19 0505)  . vancomycin       LOS: 0 days   Time spent= 50 mins    Ankit Joline Maxcy, MD [facility]  If 7PM-7AM, please contact night-coverage [email or web address] 05/08/2019, 11:48 AM

## 2019-05-08 NOTE — Progress Notes (Signed)
Johnathan Keith [id number] Admission Data: 05/08/2019 7:39 PM Attending Provider: Dimple Nanas, MD  YEL:[id number], Rosanne Ashing, MD Consults/ Treatment Team: Treatment Team:  Hilarie Fredrickson, MD  Johnathan Keith is a 58 y.o. male patient transferred from 90M awake, alert, only  Orientated to self,  Full Code, VS- Blood pressure (!) 145/78, pulse (!) 111, temperature (!) 97.2 F (36.2 C), temperature source Axillary, resp. rate 18, height 5\' 6"  (1.676 m), weight 115.6 kg, SpO2 94 %., O2  3 L nasal cannula, Tele # [id number] placed and pt is currently running:sinus tachycardia.   Allergies:  No Known Allergies   Past Medical History:  Diagnosis Date  . Chronic neck and back pain   . Coronary artery disease    drug-eluting stents placed in proximal and mid LAD  . Deviated septum   . Diabetic retinopathy (HCC)   . GERD (gastroesophageal reflux disease)    "occasionally" (03/23/2015)  . Headache    "more than 2/wk" (03/23/2015)  . Heart attack (HCC)    "in 1997 was told I had signs of a small heart attack that I didn't know I'd had"  . High cholesterol   . History of gout    "when I was younger"  . Hypertension   . Kidney stones   . Migraine    "had my 1st and only in 12/2014" (03/23/2015)  . Peripheral vascular disease (HCC)    carotid artery disease  . PONV (postoperative nausea and vomiting)    "was told I was confused and combative in recovery from the narcotics w/my carotid OR"  . Sleep apnea    "severe; couldn't afford mask" (03/23/2015)  . Stroke [facility]) 2014   "on walker for 2 months; left side is weaker and numb since" (03/23/2015)  . Type II diabetes mellitus (HCC)    Patient noted to be restless and febrile at 102.7 upon transfer.  Tylenol suppositories given, temp now down to 97.2.  Patient vitals stable.  Patient transported to CT and returned to room w/ VSS.  Informed that ultrasound will have to be rescheduled due to patient's movement.  Will cont to monitor and assist as  needed.  Eston Esters, RN 05/08/2019 7:39 PM

## 2019-05-08 NOTE — Progress Notes (Signed)
Seen and examined at bedside  Still little restless and only alert to his name.   Bladder scan shows >900cc urine. Straight cath performed. Pro CaL 22. + fever 102. AXR- no obs but concer for possible constipation? Lungs sound fairly clear b/l.   Given tylenol. Dulcolax Supp. Scheduled Senna if takes PO CT head ordered. Limited US to rule out ascites  Due to distended abdomen.  Cont Vanc Cefepime, added Flagyl.  Resp Panel, Droplet precaution.   Call as needed. May need to repeat labs later depends on how he does, otherwise tom morning.   Discussed with the RN and charge RN who are at bedside as well. Appreciate their help.   Stephania Fragmin MD [facility]

## 2019-05-08 NOTE — Progress Notes (Addendum)
PHARMACY - PHYSICIAN COMMUNICATION CRITICAL VALUE ALERT - BLOOD CULTURE IDENTIFICATION (BCID)  Johnathan Keith is an 58 y.o. male who presented to [facility] on 05/07/2019, met sepsis criteria  Assessment:  57yoM admit 5/30 with fever, weakness, confusion. Met sepsis criteria, CXray clear. Gallstone pancreatitis.  Name of physician (or Provider) ContactedReesa Chew  Current antibiotics:  Cefepime 2 gms IV q8hr Vanc 1500 mg IV x 1 in ED, then 1750 mg IV q12hr Flagyl 560m IV q8  Changes to prescribed antibiotics recommended: Suggested change Cefepime to Ceftriaxone, provider requests continue Cefepime, re-assess tomorrow to narrow abx if patient improves.   Results for orders placed or performed during the hospital encounter of 05/07/19  Blood Culture ID Panel (Reflexed) (Collected: 05/07/2019  9:11 PM)  Result Value Ref Range   Enterococcus species NOT DETECTED NOT DETECTED   Listeria monocytogenes NOT DETECTED NOT DETECTED   Staphylococcus species NOT DETECTED NOT DETECTED   Staphylococcus aureus (BCID) NOT DETECTED NOT DETECTED   Streptococcus species NOT DETECTED NOT DETECTED   Streptococcus agalactiae NOT DETECTED NOT DETECTED   Streptococcus pneumoniae NOT DETECTED NOT DETECTED   Streptococcus pyogenes NOT DETECTED NOT DETECTED   Acinetobacter baumannii NOT DETECTED NOT DETECTED   Enterobacteriaceae species DETECTED (A) NOT DETECTED   Enterobacter cloacae complex NOT DETECTED NOT DETECTED   Escherichia coli NOT DETECTED NOT DETECTED   Klebsiella oxytoca NOT DETECTED NOT DETECTED   Klebsiella pneumoniae DETECTED (A) NOT DETECTED   Proteus species NOT DETECTED NOT DETECTED   Serratia marcescens NOT DETECTED NOT DETECTED   Carbapenem resistance NOT DETECTED NOT DETECTED   Haemophilus influenzae NOT DETECTED NOT DETECTED   Neisseria meningitidis NOT DETECTED NOT DETECTED   Pseudomonas aeruginosa NOT DETECTED NOT DETECTED   Candida albicans NOT DETECTED NOT DETECTED   Candida  glabrata NOT DETECTED NOT DETECTED   Candida krusei NOT DETECTED NOT DETECTED   Candida parapsilosis NOT DETECTED NOT DETECTED   Candida tropicalis NOT DETECTED NOT DETECTED    Thank you,  GGraylin ShiverLPharmD 05/08/2019  6:07 PM

## 2019-05-08 NOTE — Consult Note (Addendum)
Referring Provider: No ref. provider found Primary Care Physician:  Charlotte Sanes, MD Primary Gastroenterologist:  Dr. Lyndel Safe  Reason for Consultation: Elevated LFTs, pancreatitis   HPI: Johnathan Keith is a 58 y.o. male with a past medical history of coronary artery disease status post stent to the proximal and mid LAD, MI 1997 on Plavix and ASA, hypercholesterolemia, peripheral vascular disease, carotid artery disease, CVA 2016, diabetes mellitus type 2, GERD and sleep apnea. He presented to the ED via EMS with AMS and generalized weakness. No history of trauma or falls. At time of admission, patient denied having any abdominal pain or change in bowel pattern. Patient is nearly obtunded at this time, unable to obtain further history. He appears critically ill. His speech is garbled. He is confused. He is respirations are labored. O2 3L [place] in place, patient is pulling out. He is tachycardic. No response to 2m IV lasix at 10am. No urine output. Foley catheter empty. He received 2 L IV fluid 5/29. Abdomen is firm and distended.   I called Dr. AReesa Chew recommended transfer patient to ICU/ICU step down. Chest Xray and Abdominal Xray ordered. Abdominal MRI/MRCP on hold until patient stable.  Labs 05/08/2019: Sodium 141.  Potassium 3.8.  Glucose 395.  BUN 33.  Creatinine 1.51.  Calcium 8.7.  Anion gap 16.  Alk phos 75.  Albumin 3.0.  AST 532.  ALT 414.  Total protein 6.2.  Total bilirubin 3.5.  Lactic acid 3.7.  Repeat lactic acid 4.1.  WBC 17.8.  Hemoglobin 12.5.  Hematocrit 38.6.  MCV 89.4.  Platelet 204. SARS negative.  Temp 100.3 5/29. 98 F 5/30. 90-123. BP 130's/70's  Abdominal sonogram 05/07/2019: 1. 9 mm stone versus tumefactive sludge within the gallbladder neck. No sonographic features to suggest acute cholecystitis.2. No biliary dilatation. 3. Increased echogenicity within the hepatic parenchyma with question of subtle nodularity of the hepatic contour, which could reflect sequelae of cirrhosis  and/or steatosis.    Past Medical History:  Diagnosis Date  . Chronic neck and back pain   . Coronary artery disease    drug-eluting stents placed in proximal and mid LAD  . Deviated septum   . [place]   . GERD (gastroesophageal reflux disease)    "occasionally" (03/23/2015)  . Headache    "more than 2/wk" (03/23/2015)  . [place]    "in 1997 was told I had signs of a small heart attack that I didn't know I'd had"  . High cholesterol   . History of gout    "when I was younger"  . Hypertension   . Kidney stones   . Migraine    "had my 1st and only in 12/2014" (03/23/2015)  . Peripheral vascular disease ([place]    carotid artery disease  . PONV (postoperative nausea and vomiting)    "was told I was confused and combative in recovery from the narcotics w/my carotid OR"  . Sleep apnea    "severe; couldn't afford mask" (03/23/2015)  . Stroke ([facility] 2014   "on walker for 2 months; left side is weaker and numb since" (03/23/2015)  . Type II diabetes mellitus ([place]     Past Surgical History:  Procedure Laterality Date  . CIRCUMCISION  1981  . CORONARY ANGIOPLASTY WITH STENT PLACEMENT  03/23/2015   "2"  . CORONARY STENT INTERVENTION N/A 09/29/2017   Procedure: CORONARY STENT INTERVENTION;  Surgeon: BLorretta Harp MD;  [facility] [place] [place];  Service: Cardiovascular;  Laterality: N/A;  .  ENDARTERECTOMY Right 09/26/2014   Procedure: RIGHT CAROTID ENDARTERECTOMY WITH PATCH ANGIOPLASTY;  Surgeon: Conrad Fort Denaud, MD;  Location: [place];  Service: Vascular;  Laterality: Right;  . EYE SURGERY Right May 2016   Cataract  . EYE SURGERY Left July 2016   Cataract  . HYDROCELE EXCISION  ~ 2008  . LEFT HEART CATH AND CORONARY ANGIOGRAPHY N/A 09/29/2017   Procedure: LEFT HEART CATH AND CORONARY ANGIOGRAPHY;  Surgeon: Lorretta Harp, MD;  Location: [place] [facility];  Service: Cardiovascular;  Laterality: N/A;  . LEFT HEART CATHETERIZATION WITH CORONARY  ANGIOGRAM N/A 03/23/2015   Procedure: LEFT HEART CATHETERIZATION WITH CORONARY ANGIOGRAM;  Surgeon: Lorretta Harp, MD;  Location[facility] C[facility] LAB;  Service: Cardiovascular;  Laterality: N/A;  . REFRACTIVE SURGERY Bilateral 08/2014-09/2014   Diabetic retinopathy     Prior to Admission medications   Medication Sig Start Date End Date Taking? Authorizing Provider  albuterol (PROVENTIL HFA;VENTOLIN HFA) 108 (90 Base) MCG/ACT inhaler Inhale 1 puff into the lungs every 6 (six) hours as needed for wheezing or shortness of breath.    [provider]  aspirin 81 MG tablet Take 1 tablet (81 mg total) by mouth daily. 07/21/18   Venancio Poisson, NP  azelastine (ASTELIN) 0.1 % nasal spray Place 1 spray into both nostrils as directed. Use in each nostril as directed    [provider]  benazepril (LOTENSIN) 10 MG tablet TAKE 2 tablets by mouth TWICE daily 08/18/18   Lorretta Harp, MD  cefdinir (OMNICEF) 300 MG capsule Take 1 capsule by mouth 2 (two) times daily. 08/17/18   [provider]  Cholecalciferol (VITAMIN D) 2000 UNITS tablet Take 2,000 Units by mouth daily.    [provider]  clopidogrel (PLAVIX) 75 MG tablet Take 1 tablet by mouth once daily 05/04/19   Lorretta Harp, MD  cyclobenzaprine (FLEXERIL) 5 MG tablet Take 5 mg by mouth daily as needed for muscle spasms.     [provider]  dicyclomine (BENTYL) 10 MG capsule Take 10 mg by mouth 4 (four) times daily.  04/22/15   [provider]  dicyclomine (BENTYL) 10 MG capsule TAKE 1 CAPSULE BY MOUTH 4 TIMES DAILY 12/21/18   Jackquline Denmark, MD  furosemide (LASIX) 80 MG tablet Take 1 tablet BY MOUTH DAILY 08/18/18   Lorretta Harp, MD  gabapentin (NEURONTIN) 100 MG capsule Take 1 capsule (100 mg total) by mouth 3 (three) times daily. Patient taking differently: Take 300 mg by mouth at bedtime.  06/15/17   Rosalin Hawking, MD  glimepiride (AMARYL) 4 MG tablet Take 4 mg by mouth 2 (two) times daily.  10/18/16   [provider]  hydrALAZINE (APRESOLINE) 25 MG tablet Take 1 tablet (25 mg total) by mouth 3 (three) times daily. 05/15/18   Lorretta Harp, MD  hydrochlorothiazide (MICROZIDE) 12.5 MG capsule Take 12.5 mg by mouth as needed (only take when blood pressure systolic is 169 and diastolic is 678).    [provider]  insulin NPH Human (HUMULIN N,NOVOLIN N) 100 UNIT/ML injection Inject into the skin as directed. Pt takes 40 units in the morning and 55 units at bedtime 06/23/17   [provider]  insulin regular (NOVOLIN R) 100 units/mL injection daily before breakfast. Sliding Scale    [provider]  loperamide (IMODIUM) 2 MG capsule Take 2-4 mg by mouth as needed for diarrhea or loose stools.     [provider]  metoprolol succinate (TOPROL-XL) 25  MG 24 hr tablet Take 3 tablets (75 mg total) by mouth daily. 05/15/18   Lorretta Harp, MD  nitroGLYCERIN (NITROSTAT) 0.4 MG SL tablet Place 1 tablet (0.4 mg total) under the tongue every 5 (five) minutes as needed for chest pain. 08/18/18   Lorretta Harp, MD  Pancrelipase, Lip-Prot-Amyl, (ZENPEP) 40000-126000 units CPEP Take 1 capsule by mouth 3 (three) times daily with meals. 05/25/18   Jackquline Denmark, MD  pantoprazole (PROTONIX) 40 MG tablet Take 1 tablet (40 mg total) by mouth daily. 03/24/18   Jackquline Denmark, MD  pravastatin (PRAVACHOL) 20 MG tablet Take 1 tablet (20 mg total) by mouth at bedtime. 08/18/18   Lorretta Harp, MD  ranitidine (ZANTAC) 75 MG tablet Take 75 mg by mouth at bedtime.    [provider]  tamsulosin (FLOMAX) 0.4 MG CAPS capsule Take 1 capsule (0.4 mg total) by mouth daily. 09/27/14   Gabriel Earing, PA-C    Current Facility-Administered Medications  Medication Dose Route Frequency Provider Last Rate Last Dose  . 0.9 %  sodium chloride infusion   Intravenous Continuous Damita Lack, MD 125 mL/hr at 05/08/19 0105    . acetaminophen (TYLENOL) tablet 650 mg   650 mg Oral Q6H PRN Phillips Grout, MD       Or  . acetaminophen (TYLENOL) suppository 650 mg  650 mg Rectal Q6H PRN Derrill Kay A, MD      . albuterol (PROVENTIL) (2.5 MG/3ML) 0.083% nebulizer solution 2.5 mg  2.5 mg Nebulization Q6H PRN Phillips Grout, MD   2.5 mg at 05/08/19 0529  . aspirin EC tablet 81 mg  81 mg Oral Daily Derrill Kay A, MD      . ceFEPIme (MAXIPIME) 2 g in sodium chloride 0.9 % 100 mL IVPB  2 g Intravenous Q8H Derrill Kay A, MD 200 mL/hr at 05/08/19 0505 2 g at 05/08/19 0505  . hydrALAZINE (APRESOLINE) injection 10 mg  10 mg Intravenous Q4H PRN Amin, Ankit Chirag, MD      . insulin aspart (novoLOG) injection 0-15 Units  0-15 Units Subcutaneous TID WC Amin, Ankit Chirag, MD      . insulin aspart (novoLOG) injection 0-5 Units  0-5 Units Subcutaneous QHS Phillips Grout, MD   4 Units at 05/08/19 0106  . insulin aspart (novoLOG) injection 0-5 Units  0-5 Units Subcutaneous QHS Amin, Ankit Chirag, MD      . insulin aspart (novoLOG) injection 0-9 Units  0-9 Units Subcutaneous TID WC David, Rachal A, MD      . insulin aspart (novoLOG) injection 3 Units  3 Units Subcutaneous TID WC Amin, Ankit Chirag, MD      . insulin detemir (LEVEMIR) injection 10 Units  10 Units Subcutaneous Daily Amin, Ankit Chirag, MD      . ondansetron (ZOFRAN) tablet 4 mg  4 mg Oral Q6H PRN Phillips Grout, MD       Or  . ondansetron (ZOFRAN) injection 4 mg  4 mg Intravenous Q6H PRN Derrill Kay A, MD      . polyethylene glycol (MIRALAX / GLYCOLAX) packet 17 g  17 g Oral Daily PRN Amin, Ankit Chirag, MD      . senna-docusate (Senokot-S) tablet 2 tablet  2 tablet Oral QHS PRN Amin, Ankit Chirag, MD      . vancomycin (VANCOCIN) 1,750 mg in sodium chloride 0.9 % 500 mL IVPB  1,750 mg Intravenous Q12H Phillips Grout, MD  Allergies as of 05/07/2019  . (No Known Allergies)    Family History  Problem Relation Age of Onset  . Heart disease Mother   . Diabetes Mother   . Hyperlipidemia Mother    . Hypertension Mother   . Cancer Father        Lung Cancer   . Heart disease Father   . Heart attack Father   . Gout Brother   . Asthma Brother   . Hyperlipidemia Brother   . Hypertension Brother   . Diabetes Brother   . Colon cancer Brother        colon  . Stroke Paternal Grandfather   . Colon cancer Sister   . Hyperlipidemia Sister   . Hypertension Sister   . Gout Maternal Uncle   . Diabetes Maternal Grandfather   . Diabetes Daughter     Social History   Socioeconomic History  . Marital status: Married    Spouse name: Not on file  . Number of children: 5  . Years of education: 12th  . Highest education level: Not on file  Occupational History  . Occupation: Disabled  Social Needs  . Financial resource strain: Not on file  . Food insecurity:    Worry: Not on file    Inability: Not on file  . Transportation needs:    Medical: Not on file    Non-medical: Not on file  Tobacco Use  . Smoking status: Never Smoker  . Smokeless tobacco: Never Used  Substance and Sexual Activity  . Alcohol use: No    Alcohol/week: 0.0 standard drinks  . Drug use: No  . Sexual activity: Not Currently    Partners: Female  Lifestyle  . Physical activity:    Days per week: Not on file    Minutes per session: Not on file  . Stress: Not on file  Relationships  . Social connections:    Talks on phone: Not on file    Gets together: Not on file    Attends religious service: Not on file    Active member of club or organization: Not on file    Attends meetings of clubs or organizations: Not on file    Relationship status: Not on file  . Intimate partner violence:    Fear of current or ex partner: Not on file    Emotionally abused: Not on file    Physically abused: Not on file    Forced sexual activity: Not on file  Other Topics Concern  . Not on file  Social History Narrative   Patient is married with 5 children.    Patient is right handed.   Patient has hs education.   Patient  drinks 4 12 oz can sodas daily.    Review of Systems: Unable to obtain ROS as patient is confused, encephalopathic   Physical Exam: Vital signs in last 24 hours: Temp:  [98.6 F (37 C)-100.3 F (37.9 C)] 98.9 F (37.2 C) (05/30 0445) Pulse Rate:  [110-123] 110 (05/30 0445) Resp:  [13-36] 18 (05/29 2345) BP: (93-136)/(53-89) 130/68 (05/30 0445) SpO2:  [94 %-100 %] 94 % (05/30 0445) Weight:  [113.4 kg-115.6 kg] 115.6 kg (05/30 0059) Last BM Date: 05/08/19 General:  Ill appearing 58 y.o. male, labored breathing, confused. Head:  Normocephalic and atraumatic. Eyes:  Sclera clear, no icterus.Conjunctiva pink. Ears:  Normal auditory acuity. Mouth: Poor dentition. Neck:  Supple. Lungs: Coarse throughout, decreased in the bases. Heart: tachycardic, no murmur.  Abdomen:   Rectal:  Deferred  Msk:  Symmetrical without gross deformities. . Pulses:  Normal pulses noted. Extremities:  Without clubbing or edema. Neurologic:  Alert and  oriented x4;  grossly normal neurologically. Skin:  Intact without significant lesions or rashes.. Psych:  Alert and cooperative. Normal mood and affect.  Intake/Output from previous day: 05/29 0701 - 05/30 0700 In: 1100 [IV Piggyback:1100] Out: 925 [Urine:925] Intake/Output this shift: Total I/O In: 569.6 [I.V.:569.6] Out: -   Lab Results: Recent Labs    05/07/19 2052 05/07/19 2104 05/08/19 0513  WBC 14.2*  --  17.8*  HGB 14.1 13.6 12.5*  HCT 44.2 40.0 38.6*  PLT 204  --  204   BMET Recent Labs    05/07/19 2052 05/07/19 2104 05/08/19 0513  NA 139 140 141  K 3.8 3.8 3.8  CL 103  --  104  CO2 24  --  21*  GLUCOSE 373*  --  395*  BUN 30*  --  33*  CREATININE 1.29*  --  1.51*  CALCIUM 9.0  --  8.7*   LFT Recent Labs    05/08/19 0513  PROT 6.2*  ALBUMIN 3.0*  AST 532*  ALT 414*  ALKPHOS 75  BILITOT 3.5*   PT/INR No results for input(s): LABPROT, INR in the last 72 hours. Hepatitis Panel No results for input(s): HEPBSAG,  HCVAB, HEPAIGM, HEPBIGM in the last 72 hours.    Studies/Results: US Abdomen Limited  Result Date: 05/08/2019 CLINICAL DATA:  Initial evaluation for acute fever, elevated LFTs and bilirubin. EXAM: ULTRASOUND ABDOMEN LIMITED RIGHT UPPER QUADRANT COMPARISON:  Prior CT from 01/19/2015 FINDINGS: Gallbladder: 9 mm echogenic focus at the gallbladder neck could reflect a small stone versus tumefactive sludge. Gallbladder wall measured at the upper limits of normal at 3 mm. No free pericholecystic fluid. No sonographic Murphy sign elicited on exam. Common bile duct: Diameter: 3.7 mm Liver: No focal lesion identified. Diffusely increased echogenicity within the hepatic parenchyma. Question of fine nodularity of the hepatic contour. Portal vein is patent on color Doppler imaging with normal direction of blood flow towards the liver. IMPRESSION: 1. 9 mm stone versus tumefactive sludge within the gallbladder neck. No sonographic features to suggest acute cholecystitis. 2. No biliary dilatation. 3. Increased echogenicity within the hepatic parenchyma with question of subtle nodularity of the hepatic contour, which could reflect sequelae of cirrhosis and/or steatosis. Electronically Signed   By: Jeannine Boga M.D.   On: 05/08/2019 00:16   Dg Chest Port 1 View  Result Date: 05/07/2019 CLINICAL DATA:  Fever EXAM: PORTABLE CHEST 1 VIEW COMPARISON:  12/05/2018 FINDINGS: Mild cardiomegaly with pulmonary vascular congestion. No overt edema. No pneumothorax or sizable pleural effusion. No focal airspace consolidation. IMPRESSION: Mild cardiomegaly with pulmonary vascular congestion. Electronically Signed   By: Ulyses Jarred M.D.   On: 05/07/2019 21:38    IMPRESSION/PLAN:  1. 58 y.o. male with AMS, generalized weakness, elevated LFTs and most likely gallstone pancreatitis, cholecystitis. Abdominal sonogram shows a 1.9 cm stone within the neck of the gallbladder, no biliary dilatation, CBD diameter 3.7 cm,  ?  Cirrhosis vs steatosis. T. Bili 3.5. Alk phos 75. AST 532. ALT 414. -Patient to transfer to ICU/step down due to worsening mental status, respiratory distress, sepsis -Abdominal MRI/MRCP to be done once patient stable -Continue Cefepime and Vancomycin -NPO -PPI IV  -follow CMP and CBC with diff closely   2. AKI. Cr. 1.51. GFR 50. -consider bladder scan, fluid and diuretic management per hospitalist   3. Acute Respiratory Failure,  fluid overload  -Portable Chest XRAY  4. Leukocytosis.WBC 17.8. Blood cultures pending. Urine culture pending.likely related to pancreatitis  4. Hx of CAD on Plavix and ASA, ? Last dose of Plavix   Further recommendations per Dr. Verta Ellen Dorathy Daft  05/08/2019, 8:16 AM  GI ATTENDING  HISTORY, LABORATORIES, X-RAYS REVIEWED.  PATIENT PERSONALLY SEEN AND EXAMINED.  AGREE WITH COMPREHENSIVE CONSULTATION NOTE AS OUTLINED ABOVE.  78-YEAR-OLD PATIENT WITH MULTIPLE COMORBIDITIES WHO PRESENTS WITH ABDOMINAL PAIN AND ALTERED MENTAL STATUS.  ON PHYSICAL EXAM THE PATIENT RESPONDS TO VERBAL AND TACTILE STIMULI.  NONCONVERSANT.  EXAMINATION REVEALS MODERATELY SEVERE TENDERNESS IN THE EPIGASTRIC REGION AND MASSIVE OBESITY.  I FEEL THAT THE PRINCIPAL PROBLEM IS ACUTE PANCREATITIS, LIKELY BILIARY.  I AGREE WITH MOVING HIM TO A STEPDOWN OR INTENSIVE CARE UNIT SETTING.  REVIEWING HIS X-RAYS AND LABORATORIES I DO NOT THINK THAT HE HAS BILIARY OBSTRUCTION (NORMAL ALKALINE PHOSPHATASE AND 3.7 MM BILE DUCT DIAMETER).  RECOMMEND AGGRESSIVE HYDRATION WITH CONSIDERATION OF HIS COMORBIDITIES IN MIND.  CHEST X-RAY RAISED THE QUESTION OF FLUID OVERLOAD (though better today).  I AGREE WITH EMPIRIC ANTIBIOTICS AS IT IS POSSIBLE THAT HE MAY HAVE CONCURRENT CHOLECYSTITIS.  WOULD PROCEED WITH ADVANCED PANCREATICOBILIARY IMAGING WHEN CLINICALLY FEASIBLE.  TREND LABORATORIES.  HIS ACUTE DISEASE SUPERIMPOSED ON HIS SIGNIFICANT COMORBIDITIES IS CONCERNING.  WE WILL CONTINUE TO FOLLOW.  Docia Chuck. Geri Seminole., M.D. [facility] Division of Gastroenterology

## 2019-05-08 NOTE — ED Notes (Signed)
Wife Janorris Sokol updated on husbands condition and admition

## 2019-05-08 NOTE — ED Notes (Signed)
ED TO INPATIENT HANDOFF REPORT  ED Nurse Name and Phone #: Preslee Regas [phone]  S Name/Age/Gender Johnathan Keith 58 y.o. male Room/Bed: 035C/035C  Code Status   Code Status: Prior  Home/SNF/Other Home Patient oriented to: self and place Is this baseline? No   Triage Complete: Triage complete  Chief Complaint Stroke Sx  Triage Note Pt BIB EMS with AMS. Pt oriented to person and place. Pt history of 2 strokes with left side deficit. Pt. With pinpoint pupils unable to follow gaze.   Pt had cardiac cath on Tuesday at [facility]. Abdomin distended with increased work of breathing and elevated RR 29. Pt. Normally on 3 L of O2 at home.   Per EMS LKW 2:30 pm      Allergies No Known Allergies  Level of Care/Admitting Diagnosis ED Disposition    ED Disposition Condition Comment   Admit  Hospital Area: [facility] [[id number]]  Level of Care: Med-Surg [16]  Covid Evaluation: Screening Protocol (No Symptoms)  Diagnosis: Fever [[id number]]  Admitting Physician: Haydee Monica [[id number]]  Attending Physician: Tarry Kos A [[id number]]  Estimated length of stay: past midnight tomorrow  Certification:: I certify this patient will need inpatient services for at least 2 midnights  PT Class (Do Not Modify): Inpatient [101]  PT Acc Code (Do Not Modify): Private [1]       B Medical/Surgery History Past Medical History:  Diagnosis Date  . Chronic neck and back pain   . Coronary artery disease    drug-eluting stents placed in proximal and mid LAD  . Deviated septum   . Diabetic retinopathy (HCC)   . GERD (gastroesophageal reflux disease)    "occasionally" (03/23/2015)  . Headache    "more than 2/wk" (03/23/2015)  . Heart attack (HCC)    "in 1997 was told I had signs of a small heart attack that I didn't know I'd had"  . High cholesterol   . History of gout    "when I was younger"  . Hypertension   . Kidney stones   . Migraine    "had my 1st and only in 12/2014"  (03/23/2015)  . Peripheral vascular disease (HCC)    carotid artery disease  . PONV (postoperative nausea and vomiting)    "was told I was confused and combative in recovery from the narcotics w/my carotid OR"  . Sleep apnea    "severe; couldn't afford mask" (03/23/2015)  . Stroke [facility]) 2014   "on walker for 2 months; left side is weaker and numb since" (03/23/2015)  . Type II diabetes mellitus (HCC)    Past Surgical History:  Procedure Laterality Date  . CIRCUMCISION  1981  . CORONARY ANGIOPLASTY WITH STENT PLACEMENT  03/23/2015   "2"  . CORONARY STENT INTERVENTION N/A 09/29/2017   Procedure: CORONARY STENT INTERVENTION;  Surgeon: Runell Gess, MD;  Location: [facility] INVASIVE CV LAB;  Service: Cardiovascular;  Laterality: N/A;  . ENDARTERECTOMY Right 09/26/2014   Procedure: RIGHT CAROTID ENDARTERECTOMY WITH PATCH ANGIOPLASTY;  Surgeon: Fransisco Hertz, MD;  Location: [facility] OR;  Service: Vascular;  Laterality: Right;  . EYE SURGERY Right May 2016   Cataract  . EYE SURGERY Left July 2016   Cataract  . HYDROCELE EXCISION  ~ 2008  . LEFT HEART CATH AND CORONARY ANGIOGRAPHY N/A 09/29/2017   Procedure: LEFT HEART CATH AND CORONARY ANGIOGRAPHY;  Surgeon: Runell Gess, MD;  Location: [facility] INVASIVE CV LAB;  Service: Cardiovascular;  Laterality: N/A;  . LEFT HEART  CATHETERIZATION WITH CORONARY ANGIOGRAM N/A 03/23/2015   Procedure: LEFT HEART CATHETERIZATION WITH CORONARY ANGIOGRAM;  Surgeon: Runell Gess, MD;  Location: [facility] CATH LAB;  Service: Cardiovascular;  Laterality: N/A;  . REFRACTIVE SURGERY Bilateral 08/2014-09/2014   Diabetic retinopathy      A IV Location/Drains/Wounds Patient Lines/Drains/Airways Status   Active Line/Drains/Airways    Name:   Placement date:   Placement time:   Site:   Days:   Peripheral IV 05/07/19 Right Hand   05/07/19    2040    Hand   1   Peripheral IV 05/07/19 Left Forearm   05/07/19    2146    Forearm   1   Peripheral IV 05/07/19 Left Forearm   05/07/19     2146    Forearm   1   Incision (Closed) 09/26/14 Neck Right   09/26/14    1001     1685          Intake/Output Last 24 hours  Intake/Output Summary (Last 24 hours) at 05/08/2019 0001 Last data filed at 05/07/2019 2317 Gross per 24 hour  Intake -  Output 400 ml  Net -400 ml    Labs/Imaging Results for orders placed or performed during the hospital encounter of 05/07/19 (from the past 48 hour(s))  CBG monitoring, ED     Status: Abnormal   Collection Time: 05/07/19  8:33 PM  Result Value Ref Range   Glucose-Capillary 353 (H) 70 - 99 mg/dL  Lactic acid, plasma     Status: Abnormal   Collection Time: 05/07/19  8:52 PM  Result Value Ref Range   Lactic Acid, Venous 3.9 (HH) 0.5 - 1.9 mmol/L    Comment: CRITICAL RESULT CALLED TO, READ BACK BY AND VERIFIED WITH: B OSORIO 05/07/2019 AT 2115 BY H SOEWARDIMAN Performed at [facility], [address], [place], [place] [address]   Comprehensive metabolic panel     Status: Abnormal   Collection Time: 05/07/19  8:52 PM  Result Value Ref Range   Sodium 139 135 - 145 mmol/L   Potassium 3.8 3.5 - 5.1 mmol/L   Chloride 103 98 - 111 mmol/L   CO2 24 22 - 32 mmol/L   Glucose, Bld 373 (H) 70 - 99 mg/dL   BUN 30 (H) 6 - 20 mg/dL   Creatinine, Ser 7.57 (H) 0.61 - 1.24 mg/dL   Calcium 9.0 8.9 - 97.2 mg/dL   Total Protein 6.3 (L) 6.5 - 8.1 g/dL   Albumin 3.3 (L) 3.5 - 5.0 g/dL   AST 820 (H) 15 - 41 U/L    Comment: RESULTS CONFIRMED BY MANUAL DILUTION   ALT 194 (H) 0 - 44 U/L   Alkaline Phosphatase 86 38 - 126 U/L   Total Bilirubin 2.3 (H) 0.3 - 1.2 mg/dL   GFR calc non Af Amer >60 >60 mL/min   GFR calc Af Amer >60 >60 mL/min   Anion gap 12 5 - 15    Comment: Performed at [facility], [address], [place], [place] [address]  CBC WITH DIFFERENTIAL     Status: Abnormal   Collection Time: 05/07/19  8:52 PM  Result Value Ref Range   WBC 14.2 (H) 4.0 - 10.5 K/uL   RBC 4.83 4.22 - 5.81 MIL/uL   Hemoglobin 14.1 13.0 - 17.0 g/dL    HCT 15.3 79.4 - 32.7 %   MCV 91.5 80.0 - 100.0 fL   MCH 29.2 26.0 - 34.0 pg  MCHC 31.9 30.0 - 36.0 g/dL   RDW 16.115.0 09.611.5 - 04.515.5 %   Platelets 204 150 - 400 K/uL   nRBC 0.0 0.0 - 0.2 %   Neutrophils Relative % 91 %   Neutro Abs 12.9 (H) 1.7 - 7.7 K/uL   Lymphocytes Relative 3 %   Lymphs Abs 0.4 (L) 0.7 - 4.0 K/uL   Monocytes Relative 5 %   Monocytes Absolute 0.7 0.1 - 1.0 K/uL   Eosinophils Relative 0 %   Eosinophils Absolute 0.1 0.0 - 0.5 K/uL   Basophils Relative 0 %   Basophils Absolute 0.0 0.0 - 0.1 K/uL   Immature Granulocytes 1 %   Abs Immature Granulocytes 0.07 0.00 - 0.07 K/uL    Comment: Performed at [facility], [address] [place], [place] [address]  I-STAT 7, (LYTES, BLD GAS, ICA, H+H)     Status: Abnormal   Collection Time: 05/07/19  9:04 PM  Result Value Ref Range   pH, Arterial 7.437 7.350 - 7.450   pCO2 arterial 37.0 32.0 - 48.0 mmHg   pO2, Arterial 78.0 (L) 83.0 - 108.0 mmHg   Bicarbonate 25.0 20.0 - 28.0 mmol/L   TCO2 26 22 - 32 mmol/L   O2 Saturation 96.0 %   Acid-Base Excess 1.0 0.0 - 2.0 mmol/L   Sodium 140 135 - 145 mmol/L   Potassium 3.8 3.5 - 5.1 mmol/L   Calcium, Ion 1.19 1.15 - 1.40 mmol/L   HCT 40.0 39.0 - 52.0 %   Hemoglobin 13.6 13.0 - 17.0 g/dL   Patient temperature 19.198.6 F    Collection site RADIAL, ALLEN'S TEST ACCEPTABLE    Drawn by RT    Sample type ARTERIAL   SARS Coronavirus 2 (CEPHEID- Performed in [facility] hospital lab), Hosp Order     Status: None   Collection Time: 05/07/19  9:26 PM  Result Value Ref Range   SARS Coronavirus 2 NEGATIVE NEGATIVE    Comment: (NOTE) If result is NEGATIVE SARS-CoV-2 target nucleic acids are NOT DETECTED. The SARS-CoV-2 RNA is generally detectable in upper and lower  respiratory specimens during the acute phase of infection. The lowest  concentration of SARS-CoV-2 viral copies this assay can detect is 250  copies / mL. A negative result does not preclude SARS-CoV-2 infection  and should  not be used as the sole basis for treatment or other  patient management decisions.  A negative result may occur with  improper specimen collection / handling, submission of specimen other  than nasopharyngeal swab, presence of viral mutation(s) within the  areas targeted by this assay, and inadequate number of viral copies  (<250 copies / mL). A negative result must be combined with clinical  observations, patient history, and epidemiological information. If result is POSITIVE SARS-CoV-2 target nucleic acids are DETECTED. The SARS-CoV-2 RNA is generally detectable in upper and lower  respiratory specimens dur ing the acute phase of infection.  Positive  results are indicative of active infection with SARS-CoV-2.  Clinical  correlation with patient history and other diagnostic information is  necessary to determine patient infection status.  Positive results do  not rule out bacterial infection or co-infection with other viruses. If result is PRESUMPTIVE POSTIVE SARS-CoV-2 nucleic acids MAY BE PRESENT.   A presumptive positive result was obtained on the submitted specimen  and confirmed on repeat testing.  While 2019 novel coronavirus  (SARS-CoV-2) nucleic acids may be present in the submitted sample  additional confirmatory testing may be necessary  for epidemiological  and / or clinical management purposes  to differentiate between  SARS-CoV-2 and other Sarbecovirus currently known to infect humans.  If clinically indicated additional testing with an alternate test  methodology [phone]) is advised. The SARS-CoV-2 RNA is generally  detectable in upper and lower respiratory sp ecimens during the acute  phase of infection. The expected result is Negative. Fact Sheet for Patients:  BoilerBrush.com.cy Fact Sheet for Healthcare Providers: https://pope.com/ This test is not yet approved or cleared by the [place] FDA and has been  authorized for detection and/or diagnosis of SARS-CoV-2 by FDA under an Emergency Use Authorization (EUA).  This EUA will remain in effect (meaning this test can be used) for the duration of the COVID-19 declaration under Section 564(b)(1) of the Act, 21 U.S.C. section 360bbb-3(b)(1), unless the authorization is terminated or revoked sooner. Performed at [facility], [address] [place], [place] [address]   Urinalysis, Routine w reflex microscopic     Status: Abnormal   Collection Time: 05/07/19 11:20 PM  Result Value Ref Range   Color, Urine YELLOW YELLOW   APPearance HAZY (A) CLEAR   Specific Gravity, Urine 1.021 1.005 - 1.030   pH 5.0 5.0 - 8.0   Glucose, UA >=500 (A) NEGATIVE mg/dL   Hgb urine dipstick NEGATIVE NEGATIVE   Bilirubin Urine NEGATIVE NEGATIVE   Ketones, ur NEGATIVE NEGATIVE mg/dL   Protein, ur NEGATIVE NEGATIVE mg/dL   Nitrite NEGATIVE NEGATIVE   Leukocytes,Ua NEGATIVE NEGATIVE   RBC / HPF 0-5 0 - 5 RBC/hpf   WBC, UA 6-10 0 - 5 WBC/hpf   Bacteria, UA NONE SEEN NONE SEEN   Squamous Epithelial / LPF 0-5 0 - 5   Mucus PRESENT    Hyaline Casts, UA PRESENT     Comment: Performed at [facility], [address] [place], [place] [address]   Dg Chest Port 1 View  Result Date: 05/07/2019 CLINICAL DATA:  Fever EXAM: PORTABLE CHEST 1 VIEW COMPARISON:  12/05/2018 FINDINGS: Mild cardiomegaly with pulmonary vascular congestion. No overt edema. No pneumothorax or sizable pleural effusion. No focal airspace consolidation. IMPRESSION: Mild cardiomegaly with pulmonary vascular congestion. Electronically Signed   By: Deatra Robinson M.D.   On: 05/07/2019 21:38    Pending Labs Unresulted Labs (From admission, onward)    Start     Ordered   05/07/19 2240  Lipase, blood  ONCE - STAT,   STAT     05/07/19 2240   05/07/19 2050  Blood gas, arterial  Once,   STAT     05/07/19 2050   05/07/19 2049  Lactic acid, plasma  Now then every 2 hours,   STAT     05/07/19 2049    05/07/19 2049  Blood Culture (routine x 2)  BLOOD CULTURE X 2,   STAT     05/07/19 2049   05/07/19 2049  Urine culture  ONCE - STAT,   STAT     05/07/19 2049          Vitals/Pain Today's Vitals   05/07/19 2205 05/07/19 2205 05/07/19 2230 05/07/19 2345  BP:   136/70 (!) 93/53  Pulse:    (!) 114  Resp:   20 18  Temp:      TempSrc:      SpO2:    96%  Weight:      Height:      PainSc: 0-No pain 0-No pain      Isolation Precautions Droplet and  Contact precautions  Medications Medications  vancomycin (VANCOCIN) 1,500 mg in sodium chloride 0.9 % 500 mL IVPB (1,500 mg Intravenous New Bag/Given 05/07/19 2240)  ceFEPIme (MAXIPIME) 2 g in sodium chloride 0.9 % 100 mL IVPB (has no administration in time range)  vancomycin (VANCOCIN) 1,750 mg in sodium chloride 0.9 % 500 mL IVPB (has no administration in time range)  ceFEPIme (MAXIPIME) 2 g in sodium chloride 0.9 % 100 mL IVPB (0 g Intravenous Stopped 05/07/19 2232)  sodium chloride 0.9 % bolus 1,000 mL (1,000 mLs Intravenous New Bag/Given 05/07/19 2159)    Mobility non-ambulatory High fall risk   Focused Assessments Multiple Problems   R Recommendations: See Admitting Provider Note  Report given to:   Additional Notes:

## 2019-05-08 NOTE — Progress Notes (Signed)
CRITICAL VALUE ALERT  Critical Value:  Lactic acid 3.7  Date & Time Notied:  0257 05/08/2019  Provider Notified: Clance Boll NP  Orders Received/Actions taken: 500cc NS blolus

## 2019-05-08 NOTE — Progress Notes (Signed)
Lactic acid 4.2 called to Dr. [name] Chimes

## 2019-05-08 NOTE — Progress Notes (Signed)
Taking over for 7p to 730 am shift.  Pt. Is alert to name and will follow direction but is not responding verbally.  He is alert and restless.  Vital Signs are stable right now.  He is O2 Satting 93 -95% on 4 L.  As soon as O2 comes off he Desatts into 70's.  Gave him a Neb treatment.  Called [facility] to come take a look at him when she gets a chance.  Will continue to monitor him closely.

## 2019-05-08 NOTE — ED Notes (Signed)
Attempted to call report will call back 5-10 minutes

## 2019-05-08 NOTE — Progress Notes (Signed)
Pt unable to urinate this morning. Bladder scan showed 430 ml in the bladder. Provider on call made aware. In and out catheter has been ordered and executed. Got 525 ml of urine out.

## 2019-05-08 NOTE — H&P (Signed)
History and Physical    Hendricks Keith ZOX:[id number] DOB: December 18, 1960 DOA: 05/07/2019  PCP: Shelle Iron, MD  Patient coming from: Home  Chief Complaint: Fever chills confusion  HPI: Johnathan Keith is a 58 y.o. male with medical history significant of GERD, hypertension, coronary artery disease, diabetes sent in by his wife due to fever chills with associated weakness and confusion with a couple of episodes of nausea and vomiting earlier today.  Patient is alert and oriented knows where he is at but he is slow to respond he appears dehydrated.  On arrival his fever was at 100.3 and tachycardic.  He denies any pain has not had any nausea vomiting or diarrhea since arrival here.  He denies any shortness of breath or urinary symptoms.  Although he is slightly confused I am not sure how reliable his history is.  Patient be referred for admission for fever of unclear etiology with a lactic acid level of 3.9.  Review of Systems: As per HPI otherwise 10 point review of systems negative but may be unreliable  Past Medical History:  Diagnosis Date  . Chronic neck and back pain   . Coronary artery disease    drug-eluting stents placed in proximal and mid LAD  . Deviated septum   . Diabetic retinopathy (HCC)   . GERD (gastroesophageal reflux disease)    "occasionally" (03/23/2015)  . Headache    "more than 2/wk" (03/23/2015)  . Heart attack (HCC)    "in 1997 was told I had signs of a small heart attack that I didn't know I'd had"  . High cholesterol   . History of gout    "when I was younger"  . Hypertension   . Kidney stones   . Migraine    "had my 1st and only in 12/2014" (03/23/2015)  . Peripheral vascular disease (HCC)    carotid artery disease  . PONV (postoperative nausea and vomiting)    "was told I was confused and combative in recovery from the narcotics w/my carotid OR"  . Sleep apnea    "severe; couldn't afford mask" (03/23/2015)  . Stroke [facility]) 2014   "on walker for 2 months;  left side is weaker and numb since" (03/23/2015)  . Type II diabetes mellitus (HCC)     Past Surgical History:  Procedure Laterality Date  . CIRCUMCISION  1981  . CORONARY ANGIOPLASTY WITH STENT PLACEMENT  03/23/2015   "2"  . CORONARY STENT INTERVENTION N/A 09/29/2017   Procedure: CORONARY STENT INTERVENTION;  Surgeon: Runell Gess, MD;  Location: [facility] INVASIVE CV LAB;  Service: Cardiovascular;  Laterality: N/A;  . ENDARTERECTOMY Right 09/26/2014   Procedure: RIGHT CAROTID ENDARTERECTOMY WITH PATCH ANGIOPLASTY;  Surgeon: Fransisco Hertz, MD;  Location: [facility] OR;  Service: Vascular;  Laterality: Right;  . EYE SURGERY Right May 2016   Cataract  . EYE SURGERY Left July 2016   Cataract  . HYDROCELE EXCISION  ~ 2008  . LEFT HEART CATH AND CORONARY ANGIOGRAPHY N/A 09/29/2017   Procedure: LEFT HEART CATH AND CORONARY ANGIOGRAPHY;  Surgeon: Runell Gess, MD;  Location: [facility] INVASIVE CV LAB;  Service: Cardiovascular;  Laterality: N/A;  . LEFT HEART CATHETERIZATION WITH CORONARY ANGIOGRAM N/A 03/23/2015   Procedure: LEFT HEART CATHETERIZATION WITH CORONARY ANGIOGRAM;  Surgeon: Runell Gess, MD;  Location: [facility] CATH LAB;  Service: Cardiovascular;  Laterality: N/A;  . REFRACTIVE SURGERY Bilateral 08/2014-09/2014   Diabetic retinopathy      reports that he has never smoked. He has  never used smokeless tobacco. He reports that he does not drink alcohol or use drugs.  No Known Allergies  Family History  Problem Relation Age of Onset  . Heart disease Mother   . Diabetes Mother   . Hyperlipidemia Mother   . Hypertension Mother   . Cancer Father        Lung Cancer   . Heart disease Father   . Heart attack Father   . Gout Brother   . Asthma Brother   . Hyperlipidemia Brother   . Hypertension Brother   . Diabetes Brother   . Colon cancer Brother        colon  . Stroke Paternal Grandfather   . Colon cancer Sister   . Hyperlipidemia Sister   . Hypertension Sister   . Gout Maternal Uncle    . Diabetes Maternal Grandfather   . Diabetes Daughter     Prior to Admission medications   Medication Sig Start Date End Date Taking? Authorizing Provider  albuterol (PROVENTIL HFA;VENTOLIN HFA) 108 (90 Base) MCG/ACT inhaler Inhale 1 puff into the lungs every 6 (six) hours as needed for wheezing or shortness of breath.    [provider]  aspirin 81 MG tablet Take 1 tablet (81 mg total) by mouth daily. 07/21/18   George Hugh, NP  azelastine (ASTELIN) 0.1 % nasal spray Place 1 spray into both nostrils as directed. Use in each nostril as directed    [provider]  benazepril (LOTENSIN) 10 MG tablet TAKE 2 tablets by mouth TWICE daily 08/18/18   Runell Gess, MD  cefdinir (OMNICEF) 300 MG capsule Take 1 capsule by mouth 2 (two) times daily. 08/17/18   [provider]  Cholecalciferol (VITAMIN D) 2000 UNITS tablet Take 2,000 Units by mouth daily.    [provider]  clopidogrel (PLAVIX) 75 MG tablet Take 1 tablet by mouth once daily 05/04/19   Runell Gess, MD  cyclobenzaprine (FLEXERIL) 5 MG tablet Take 5 mg by mouth daily as needed for muscle spasms.     [provider]  dicyclomine (BENTYL) 10 MG capsule Take 10 mg by mouth 4 (four) times daily.  04/22/15   [provider]  dicyclomine (BENTYL) 10 MG capsule TAKE 1 CAPSULE BY MOUTH 4 TIMES DAILY 12/21/18   Lynann Bologna, MD  furosemide (LASIX) 80 MG tablet Take 1 tablet BY MOUTH DAILY 08/18/18   Runell Gess, MD  gabapentin (NEURONTIN) 100 MG capsule Take 1 capsule (100 mg total) by mouth 3 (three) times daily. Patient taking differently: Take 300 mg by mouth at bedtime.  06/15/17   Marvel Plan, MD  glimepiride (AMARYL) 4 MG tablet Take 4 mg by mouth 2 (two) times daily. 10/18/16   [provider]  hydrALAZINE (APRESOLINE) 25 MG tablet Take 1 tablet (25 mg total) by mouth 3 (three) times daily. 05/15/18   Runell Gess, MD  hydrochlorothiazide (MICROZIDE) 12.5 MG  capsule Take 12.5 mg by mouth as needed (only take when blood pressure systolic is 170 and diastolic is 100).    [provider]  insulin NPH Human (HUMULIN N,NOVOLIN N) 100 UNIT/ML injection Inject into the skin as directed. Pt takes 40 units in the morning and 55 units at bedtime 06/23/17   [provider]  insulin regular (NOVOLIN R) 100 units/mL injection daily before breakfast. Sliding Scale    [provider]  loperamide (IMODIUM) 2 MG capsule Take 2-4 mg by mouth as needed for diarrhea or loose  stools.     [provider]  metoprolol succinate (TOPROL-XL) 25 MG 24 hr tablet Take 3 tablets (75 mg total) by mouth daily. 05/15/18   Runell Gess, MD  nitroGLYCERIN (NITROSTAT) 0.4 MG SL tablet Place 1 tablet (0.4 mg total) under the tongue every 5 (five) minutes as needed for chest pain. 08/18/18   Runell Gess, MD  Pancrelipase, Lip-Prot-Amyl, (ZENPEP) 40000-126000 units CPEP Take 1 capsule by mouth 3 (three) times daily with meals. 05/25/18   Lynann Bologna, MD  pantoprazole (PROTONIX) 40 MG tablet Take 1 tablet (40 mg total) by mouth daily. 03/24/18   Lynann Bologna, MD  pravastatin (PRAVACHOL) 20 MG tablet Take 1 tablet (20 mg total) by mouth at bedtime. 08/18/18   Runell Gess, MD  ranitidine (ZANTAC) 75 MG tablet Take 75 mg by mouth at bedtime.    [provider]  tamsulosin (FLOMAX) 0.4 MG CAPS capsule Take 1 capsule (0.4 mg total) by mouth daily. 09/27/14   Dara Lords, PA-C    Physical Exam: Vitals:   05/07/19 2200 05/07/19 2230 05/07/19 2345 05/08/19 0026  BP: 110/65 136/70 (!) 93/53   Pulse: (!) 121  (!) 114   Resp: Temp:    99.4 F (37.4 C)  TempSrc:    Oral  SpO2: 96%  96%   Weight:      Height:          Constitutional: NAD, calm, comfortable Vitals:   05/07/19 2200 05/07/19 2230 05/07/19 2345 05/08/19 0026  BP: 110/65 136/70 (!) 93/53   Pulse: (!) 121  (!) 114   Resp: Temp:    99.4 F  (37.4 C)  TempSrc:    Oral  SpO2: 96%  96%   Weight:      Height:       Eyes: PERRL, lids and conjunctivae normal ENMT: Mucous membranes are moist. Posterior pharynx clear of any exudate or lesions.Normal dentition.  Neck: normal, supple, no masses, no thyromegaly Respiratory: clear to auscultation bilaterally, no wheezing, no crackles. Normal respiratory effort. No accessory muscle use.  Cardiovascular: Regular rate and rhythm, no murmurs / rubs / gallops. No extremity edema. 2+ pedal pulses. No carotid bruits.  Abdomen: no tenderness, no masses palpated. No hepatosplenomegaly. Bowel sounds positive.  Musculoskeletal: no clubbing / cyanosis. No joint deformity upper and lower extremities. Good ROM, no contractures. Normal muscle tone.  Skin: no rashes, lesions, ulcers. No induration Neurologic: CN 2-12 grossly intact. Sensation intact, DTR normal. Strength 5/5 in all 4.  Psychiatric: Normal judgment and insight. Alert and oriented x 3. Normal mood.    Labs on Admission: I have personally reviewed following labs and imaging studies  CBC: Recent Labs  Lab 05/07/19 2052 05/07/19 2104  WBC 14.2*  --   NEUTROABS 12.9*  --   HGB 14.1 13.6  HCT 44.2 40.0  MCV 91.5  --   PLT 204  --    Basic Metabolic Panel: Recent Labs  Lab 05/07/19 2052 05/07/19 2104  NA 139 140  K 3.8 3.8  CL 103  --   CO2 24  --   GLUCOSE 373*  --   BUN 30*  --   CREATININE 1.29*  --   CALCIUM 9.0  --    GFR: Estimated Creatinine Clearance: 72.9 mL/min (A) (by C-G formula based on SCr of 1.29 mg/dL (H)). Liver Function Tests: Recent Labs  Lab 05/07/19 2052  AST 309*  ALT 194*  ALKPHOS 86  BILITOT 2.3*  PROT 6.3*  ALBUMIN 3.3*   No results for input(s): LIPASE, AMYLASE in the last 168 hours. No results for input(s): AMMONIA in the last 168 hours. Coagulation Profile: No results for input(s): INR, PROTIME in the last 168 hours. Cardiac Enzymes: No results for input(s): CKTOTAL, CKMB,  CKMBINDEX, TROPONINI in the last 168 hours. BNP (last 3 results) No results for input(s): PROBNP in the last 8760 hours. HbA1C: No results for input(s): HGBA1C in the last 72 hours. CBG: Recent Labs  Lab 05/07/19 2033  GLUCAP 353*   Lipid Profile: No results for input(s): CHOL, HDL, LDLCALC, TRIG, CHOLHDL, LDLDIRECT in the last 72 hours. Thyroid Function Tests: No results for input(s): TSH, T4TOTAL, FREET4, T3FREE, THYROIDAB in the last 72 hours. Anemia Panel: No results for input(s): VITAMINB12, FOLATE, FERRITIN, TIBC, IRON, RETICCTPCT in the last 72 hours. Urine analysis:    Component Value Date/Time   COLORURINE YELLOW 05/07/2019 2320   APPEARANCEUR HAZY (A) 05/07/2019 2320   LABSPEC 1.021 05/07/2019 2320   PHURINE 5.0 05/07/2019 2320   GLUCOSEU >=500 (A) 05/07/2019 2320   HGBUR NEGATIVE 05/07/2019 2320   BILIRUBINUR NEGATIVE 05/07/2019 2320   KETONESUR NEGATIVE 05/07/2019 2320   PROTEINUR NEGATIVE 05/07/2019 2320   UROBILINOGEN 0.2 09/22/2014 1433   NITRITE NEGATIVE 05/07/2019 2320   LEUKOCYTESUR NEGATIVE 05/07/2019 2320   Sepsis Labs: !!!!!!!!!!!!!!!!!!!!!!!!!!!!!!!!!!!!!!!!!!!! @LABRCNTIP (procalcitonin:4,lacticidven:4) ) Recent Results (from the past 240 hour(s))  SARS Coronavirus 2 (CEPHEID- Performed in [facility] hospital lab), Hosp Order     Status: None   Collection Time: 05/07/19  9:26 PM  Result Value Ref Range Status   SARS Coronavirus 2 NEGATIVE NEGATIVE Final    Comment: (NOTE) If result is NEGATIVE SARS-CoV-2 target nucleic acids are NOT DETECTED. The SARS-CoV-2 RNA is generally detectable in upper and lower  respiratory specimens during the acute phase of infection. The lowest  concentration of SARS-CoV-2 viral copies this assay can detect is 250  copies / mL. A negative result does not preclude SARS-CoV-2 infection  and should not be used as the sole basis for treatment or other  patient management decisions.  A negative result may occur with   improper specimen collection / handling, submission of specimen other  than nasopharyngeal swab, presence of viral mutation(s) within the  areas targeted by this assay, and inadequate number of viral copies  (<250 copies / mL). A negative result must be combined with clinical  observations, patient history, and epidemiological information. If result is POSITIVE SARS-CoV-2 target nucleic acids are DETECTED. The SARS-CoV-2 RNA is generally detectable in upper and lower  respiratory specimens dur ing the acute phase of infection.  Positive  results are indicative of active infection with SARS-CoV-2.  Clinical  correlation with patient history and other diagnostic information is  necessary to determine patient infection status.  Positive results do  not rule out bacterial infection or co-infection with other viruses. If result is PRESUMPTIVE POSTIVE SARS-CoV-2 nucleic acids MAY BE PRESENT.   A presumptive positive result was obtained on the submitted specimen  and confirmed on repeat testing.  While 2019 novel coronavirus  (SARS-CoV-2) nucleic acids may be present in the submitted sample  additional confirmatory testing may be necessary for epidemiological  and / or clinical management purposes  to differentiate between  SARS-CoV-2 and other Sarbecovirus currently known to infect humans.  If clinically indicated additional testing with an alternate test  methodology [id number]) is advised. The SARS-CoV-2 RNA is generally  detectable in upper and lower respiratory sp ecimens during the acute  phase of infection. The expected result is Negative. Fact Sheet for Patients:  BoilerBrush.com.cy Fact Sheet for Healthcare Providers: https://pope.com/ This test is not yet approved or cleared by the [place] FDA and has been authorized for detection and/or diagnosis of SARS-CoV-2 by FDA under an Emergency Use Authorization (EUA).  This EUA will  remain in effect (meaning this test can be used) for the duration of the COVID-19 declaration under Section 564(b)(1) of the Act, 21 U.S.C. section 360bbb-3(b)(1), unless the authorization is terminated or revoked sooner. Performed at [facility], [address] [place], [place] [address]      Radiological Exams on Admission: US Abdomen Limited  Result Date: 05/08/2019 CLINICAL DATA:  Initial evaluation for acute fever, elevated LFTs and bilirubin. EXAM: ULTRASOUND ABDOMEN LIMITED RIGHT UPPER QUADRANT COMPARISON:  Prior CT from 01/19/2015 FINDINGS: Gallbladder: 9 mm echogenic focus at the gallbladder neck could reflect a small stone versus tumefactive sludge. Gallbladder wall measured at the upper limits of normal at 3 mm. No free pericholecystic fluid. No sonographic Murphy sign elicited on exam. Common bile duct: Diameter: 3.7 mm Liver: No focal lesion identified. Diffusely increased echogenicity within the hepatic parenchyma. Question of fine nodularity of the hepatic contour. Portal vein is patent on color Doppler imaging with normal direction of blood flow towards the liver. IMPRESSION: 1. 9 mm stone versus tumefactive sludge within the gallbladder neck. No sonographic features to suggest acute cholecystitis. 2. No biliary dilatation. 3. Increased echogenicity within the hepatic parenchyma with question of subtle nodularity of the hepatic contour, which could reflect sequelae of cirrhosis and/or steatosis. Electronically Signed   By: Rise Mu M.D.   On: 05/08/2019 00:16   Dg Chest Port 1 View  Result Date: 05/07/2019 CLINICAL DATA:  Fever EXAM: PORTABLE CHEST 1 VIEW COMPARISON:  12/05/2018 FINDINGS: Mild cardiomegaly with pulmonary vascular congestion. No overt edema. No pneumothorax or sizable pleural effusion. No focal airspace consolidation. IMPRESSION: Mild cardiomegaly with pulmonary vascular congestion. Electronically Signed   By: Deatra Robinson M.D.   On: 05/07/2019  21:38   Old chart reviewed Chest x-ray reviewed with mild congestion no focal infiltrate Case discussed with EDP  Assessment/Plan 58 year old male with fever of unclear etiology Principal Problem:   Fever-UA is pending.  Ultrasound shows some concerns for possible gallbladder etiology.  Placed patient on vancomycin and cefepime.  IV fluids.  Serial lactic acid every 3 hours.  If patient deteriorates at all call general surgery otherwise call in the morning.  Abdominal exam is totally benign.  Active Problems:   Type 2 diabetes mellitus with vascular disease (HCC)-sliding scale insulin   Essential hypertension-holding blood pressure meds at this time   CAD -S/P PCI-2016-stable   History of stroke-does not appear to have permanent defects from this     DVT prophylaxis: SCDs Code Status: Full Family Communication: None Disposition Plan: Unknown Consults called: None Admission status: Admission   Rogue Rafalski A MD [facility]  If 7PM-7AM, please contact night-coverage [email or web address] Password [email or web address]  05/08/2019, 12:28 AM

## 2019-05-08 NOTE — Progress Notes (Signed)
CRITICAL VALUE ALERT  Critical Value:  Lactic acid 4.1  Date & Time Notied:  9417 and 0641 05/08/2019  Provider Notified: Clance Boll, NP  Orders Received/Actions taken: Give 500cc bolus

## 2019-05-09 ENCOUNTER — Inpatient Hospital Stay (HOSPITAL_COMMUNITY): Payer: Medicare HMO

## 2019-05-09 ENCOUNTER — Inpatient Hospital Stay: Payer: Self-pay

## 2019-05-09 DIAGNOSIS — G8929 Other chronic pain: Secondary | ICD-10-CM

## 2019-05-09 DIAGNOSIS — R740 Nonspecific elevation of levels of transaminase and lactic acid dehydrogenase [LDH]: Secondary | ICD-10-CM

## 2019-05-09 DIAGNOSIS — K7200 Acute and subacute hepatic failure without coma: Secondary | ICD-10-CM

## 2019-05-09 DIAGNOSIS — K8500 Idiopathic acute pancreatitis without necrosis or infection: Secondary | ICD-10-CM

## 2019-05-09 DIAGNOSIS — I10 Essential (primary) hypertension: Secondary | ICD-10-CM

## 2019-05-09 DIAGNOSIS — Z9861 Coronary angioplasty status: Secondary | ICD-10-CM

## 2019-05-09 DIAGNOSIS — A419 Sepsis, unspecified organism: Secondary | ICD-10-CM

## 2019-05-09 DIAGNOSIS — R932 Abnormal findings on diagnostic imaging of liver and biliary tract: Secondary | ICD-10-CM

## 2019-05-09 DIAGNOSIS — R6520 Severe sepsis without septic shock: Secondary | ICD-10-CM

## 2019-05-09 DIAGNOSIS — E1159 Type 2 diabetes mellitus with other circulatory complications: Secondary | ICD-10-CM

## 2019-05-09 DIAGNOSIS — I2510 Atherosclerotic heart disease of native coronary artery without angina pectoris: Secondary | ICD-10-CM

## 2019-05-09 DIAGNOSIS — R17 Unspecified jaundice: Secondary | ICD-10-CM

## 2019-05-09 LAB — BLOOD GAS, ARTERIAL
Acid-Base Excess: 0.3 mmol/L (ref 0.0–2.0)
Acid-Base Excess: 0.6 mmol/L (ref 0.0–2.0)
Bicarbonate: 24.3 mmol/L (ref 20.0–28.0)
Bicarbonate: 24.7 mmol/L (ref 20.0–28.0)
Drawn by: 270221
O2 Content: 5 L/min
O2 Content: 10 L/min
O2 Saturation: 84.3 %
O2 Saturation: 92.4 %
Patient temperature: 102.6
Patient temperature: 98.6
pCO2 arterial: 43.3 mmHg (ref 32.0–48.0)
pCO2 arterial: 39.6 mmHg (ref 32.0–48.0)
pH, Arterial: 7.38 (ref 7.350–7.450)
pH, Arterial: 7.412 (ref 7.350–7.450)
pO2, Arterial: 59.1 mmHg — ABNORMAL LOW (ref 83.0–108.0)
pO2, Arterial: 69.2 mmHg — ABNORMAL LOW (ref 83.0–108.0)

## 2019-05-09 LAB — COMPREHENSIVE METABOLIC PANEL
ALT: 460 U/L — ABNORMAL HIGH (ref 0–44)
AST: 334 U/L — ABNORMAL HIGH (ref 15–41)
Albumin: 2.8 g/dL — ABNORMAL LOW (ref 3.5–5.0)
Alkaline Phosphatase: 87 U/L (ref 38–126)
Anion gap: 14 (ref 5–15)
BUN: 18 mg/dL (ref 6–20)
CO2: 23 mmol/L (ref 22–32)
Calcium: 8.2 mg/dL — ABNORMAL LOW (ref 8.9–10.3)
Chloride: 110 mmol/L (ref 98–111)
Creatinine, Ser: 1.33 mg/dL — ABNORMAL HIGH (ref 0.61–1.24)
GFR calc Af Amer: >60 mL/min (ref >60)
GFR calc non Af Amer: 58 mL/min — ABNORMAL LOW (ref >60)
Glucose, Bld: 353 mg/dL — ABNORMAL HIGH (ref 70–99)
Potassium: 3.5 mmol/L (ref 3.5–5.1)
Sodium: 147 mmol/L — ABNORMAL HIGH (ref 135–145)
Total Bilirubin: 5.1 mg/dL — ABNORMAL HIGH (ref 0.3–1.2)
Total Protein: 6.6 g/dL (ref 6.5–8.1)

## 2019-05-09 LAB — CBC
HCT: 40.7 % (ref 39.0–52.0)
Hemoglobin: 13 g/dL (ref 13.0–17.0)
MCH: 29.1 pg (ref 26.0–34.0)
MCHC: 31.9 g/dL (ref 30.0–36.0)
MCV: 91.1 fL (ref 80.0–100.0)
Platelets: 155 10*3/uL (ref 150–400)
RBC: 4.47 MIL/uL (ref 4.22–5.81)
RDW: 16.6 % — ABNORMAL HIGH (ref 11.5–15.5)
WBC: 8.9 10*3/uL (ref 4.0–10.5)
nRBC: 0 % (ref 0.0–0.2)

## 2019-05-09 LAB — GLUCOSE, CAPILLARY
Glucose-Capillary: 359 mg/dL — ABNORMAL HIGH (ref 70–99)
Glucose-Capillary: 396 mg/dL — ABNORMAL HIGH (ref 70–99)
Glucose-Capillary: 330 mg/dL — ABNORMAL HIGH (ref 70–99)
Glucose-Capillary: 287 mg/dL — ABNORMAL HIGH (ref 70–99)
Glucose-Capillary: 179 mg/dL — ABNORMAL HIGH (ref 70–99)

## 2019-05-09 LAB — LACTIC ACID, PLASMA: Lactic Acid, Venous: 1.9 mmol/L (ref 0.5–1.9)

## 2019-05-09 LAB — URINE CULTURE: Culture: NO GROWTH

## 2019-05-09 LAB — LIPASE, BLOOD: Lipase: 20 U/L (ref 11–51)

## 2019-05-09 LAB — BRAIN NATRIURETIC PEPTIDE: B Natriuretic Peptide: 223.8 pg/mL — ABNORMAL HIGH (ref 0.0–100.0)

## 2019-05-09 LAB — MAGNESIUM: Magnesium: 1.7 mg/dL (ref 1.7–2.4)

## 2019-05-09 LAB — MRSA PCR SCREENING: MRSA by PCR: NEGATIVE

## 2019-05-09 LAB — TROPONIN I: Troponin I: 0.11 ng/mL (ref <0.03)

## 2019-05-09 LAB — PROCALCITONIN: Procalcitonin: 7.98 ng/mL

## 2019-05-09 IMAGING — DX PORTABLE ABDOMEN - 1 VIEW
2 series · 2 of 2 positions shown · non-contrast
Comparison: None.

CLINICAL DATA: Abdominal distention. Shortness of breath.
Congestive heart failure.

EXAM:
PORTABLE ABDOMEN - 1 VIEW

[abdomen kub (1 of 2)]
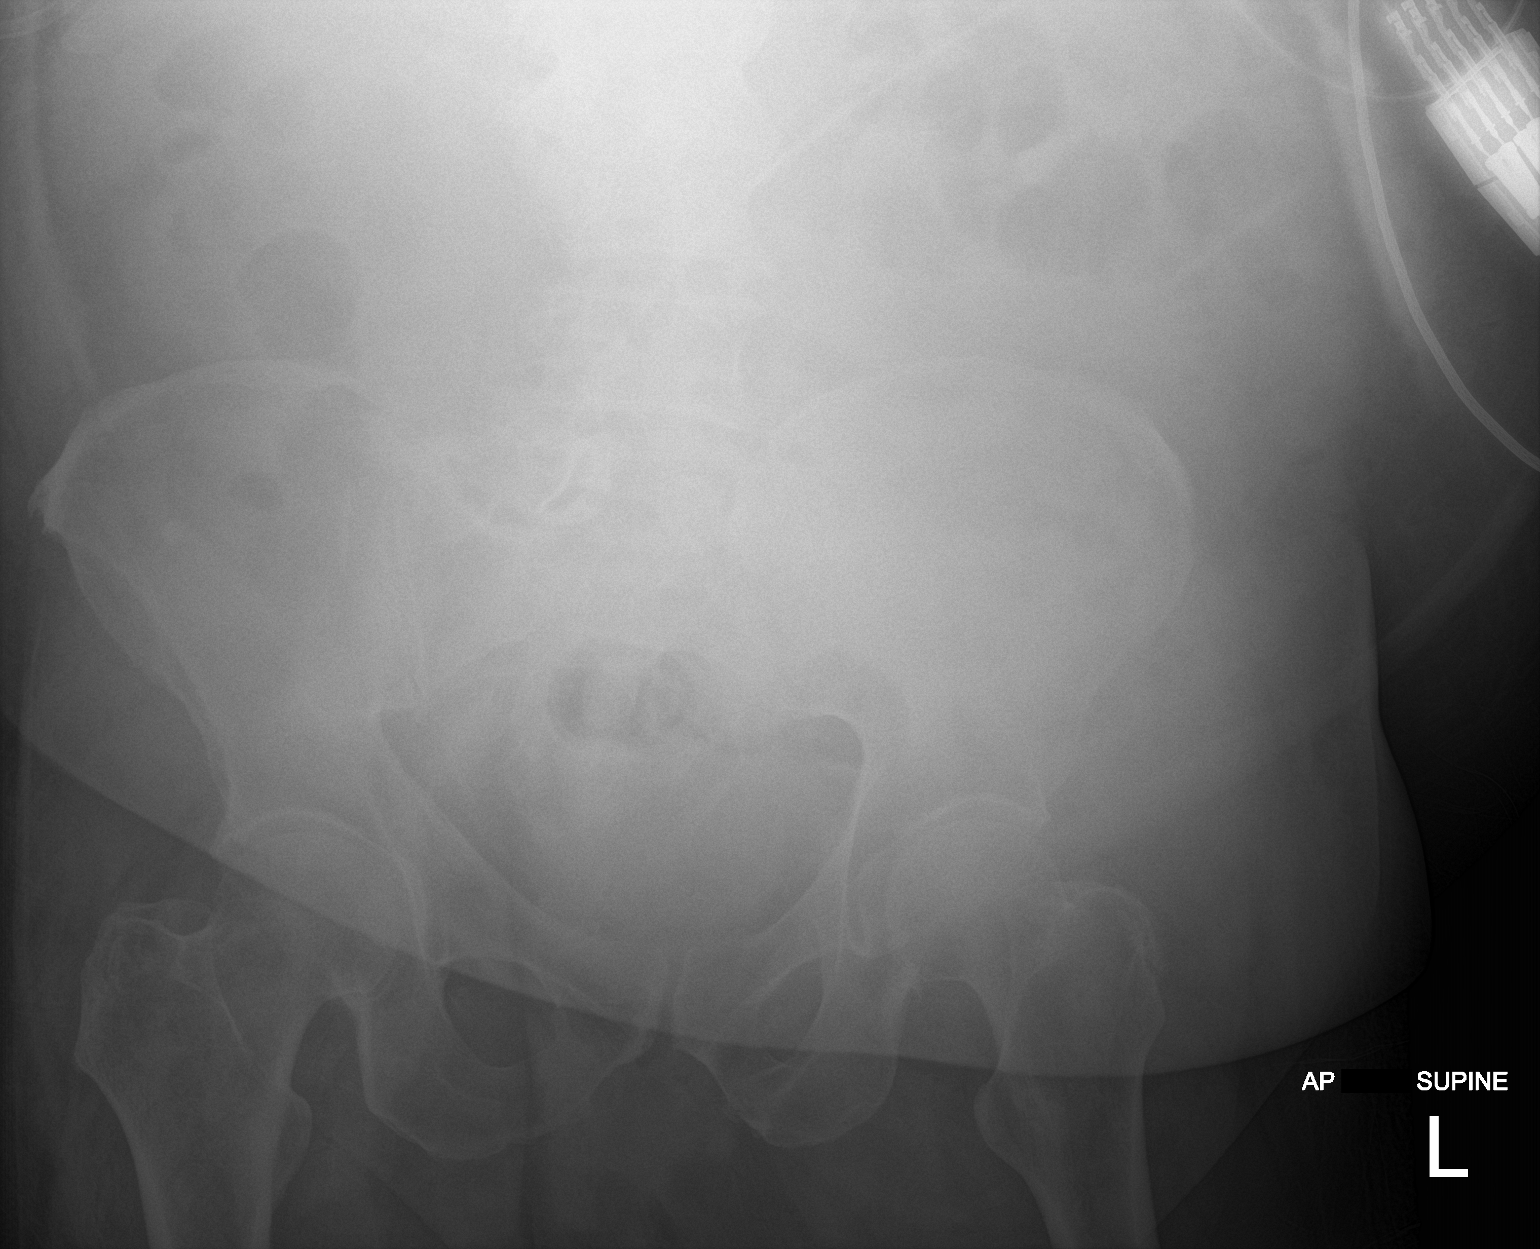

[abdomen kub (2 of 2)]
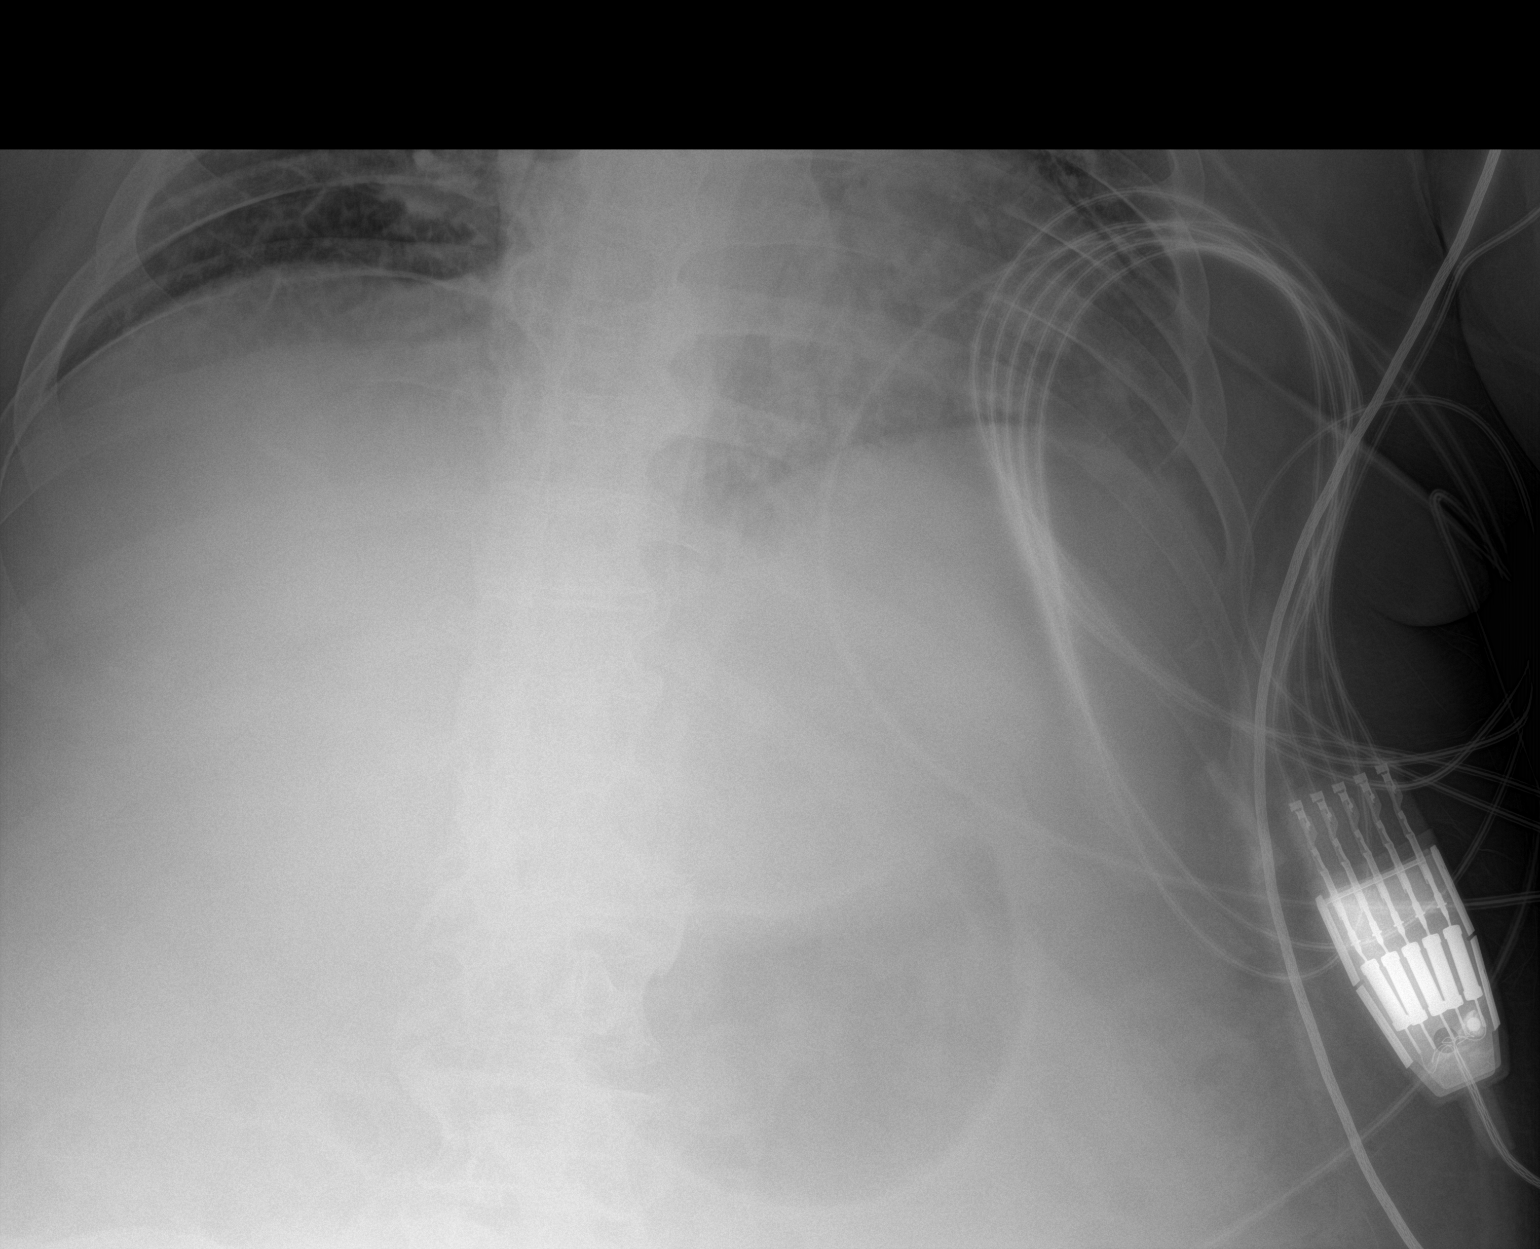

[2 of 2 positions shown; findings below may reference images not displayed]

FINDINGS: The bowel gas pattern is normal. No radio-opaque calculi or other
significant radiographic abnormality are seen.
IMPRESSION: Negative.

## 2019-05-09 IMAGING — CR PORTABLE CHEST - 1 VIEW
1 series · 1 of 1 positions shown · non-contrast
Comparison: Single-view of the chest earlier today.

CLINICAL DATA: Altered mental status and shortness of breath today.

EXAM:
PORTABLE CHEST 1 VIEW

[AP]
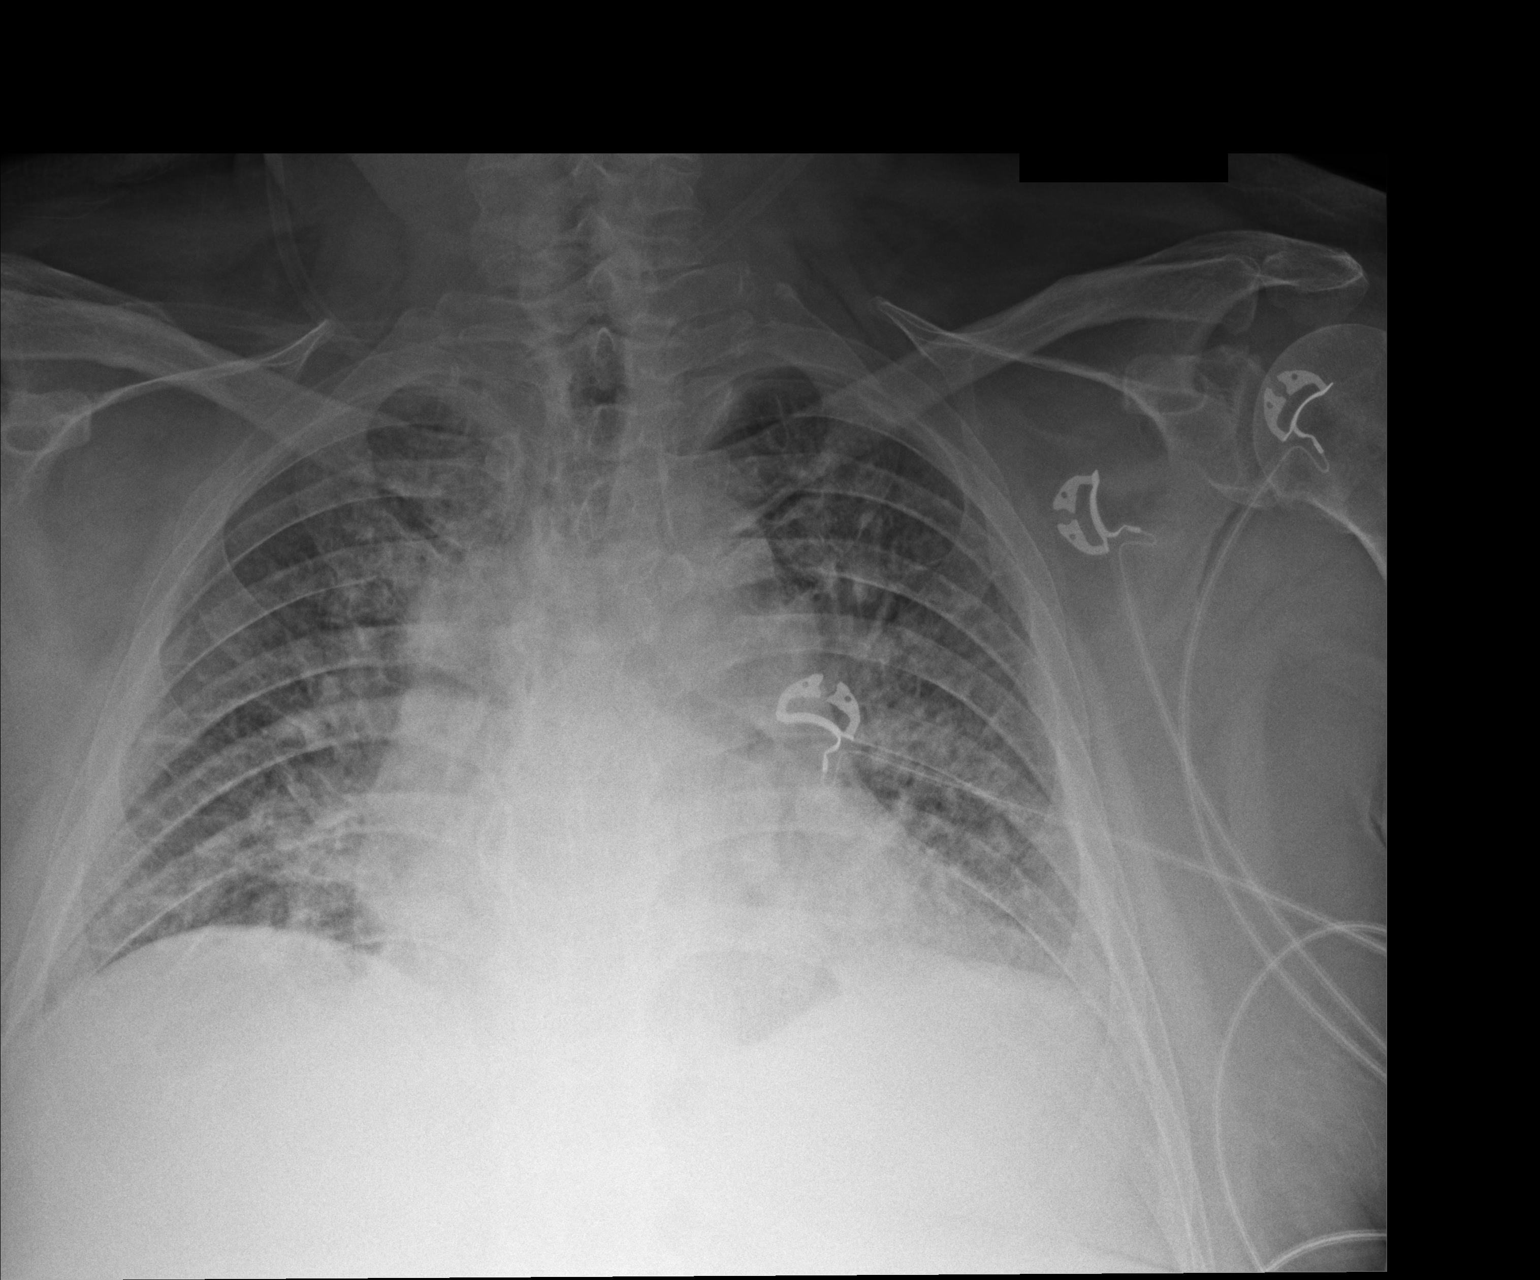

[1 of 1 positions shown; findings below may reference images not displayed]

FINDINGS: Cardiomegaly and pulmonary edema persist without notable change. No
pneumothorax or pleural effusion. No acute or focal bony
abnormality.
IMPRESSION: No change in findings most compatible with congestive heart failure.

## 2019-05-09 IMAGING — CT CT ABDOMEN AND PELVIS WITH CONTRAST
2 of 5 series · 16 of 46 positions shown, 18 images · IV contrast (omnipaque)
Comparison: None.

CLINICAL DATA: Fever.  Concern for pancreatitis

EXAM:
CT ABDOMEN AND PELVIS WITH CONTRAST
TECHNIQUE: Multidetector CT imaging of the abdomen and pelvis was performed
using the standard protocol following bolus administration of
intravenous contrast.
CONTRAST:  100mL OMNIPAQUE IOHEXOL 300 MG/ML  SOLN

[Series 3: abdomen 5.0 · axial · 0.94mm/px · z∈[+1018,+1438]mm · 13 of 98 slices shown, 15 images]
[im 7/98  soft-tissue]
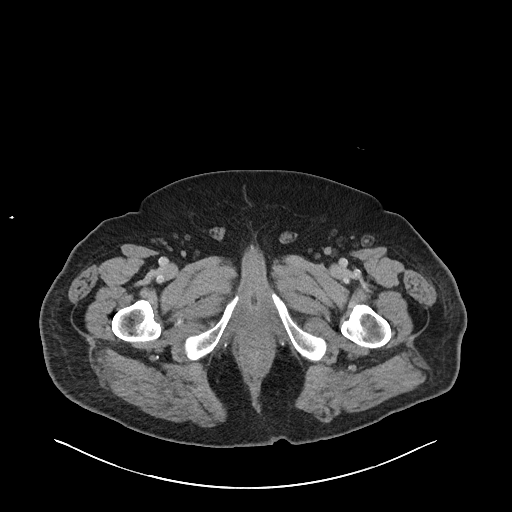
[im 7/98  bone]
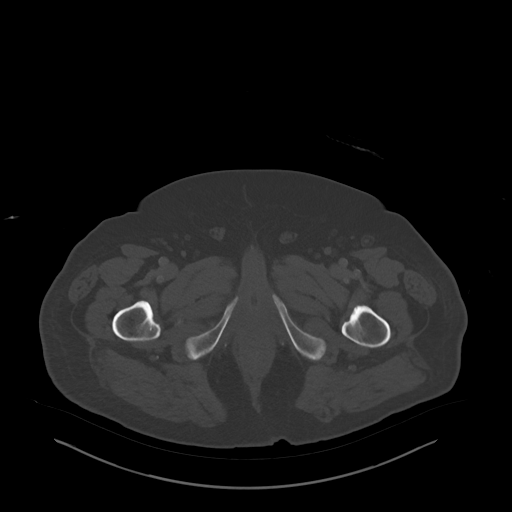
[im 14/98  soft-tissue]
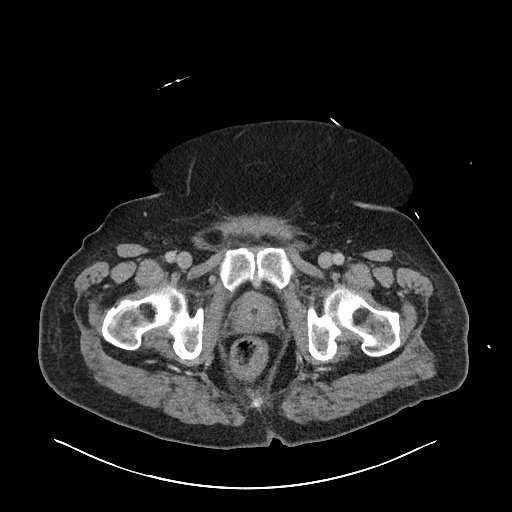
[im 21/98  soft-tissue]
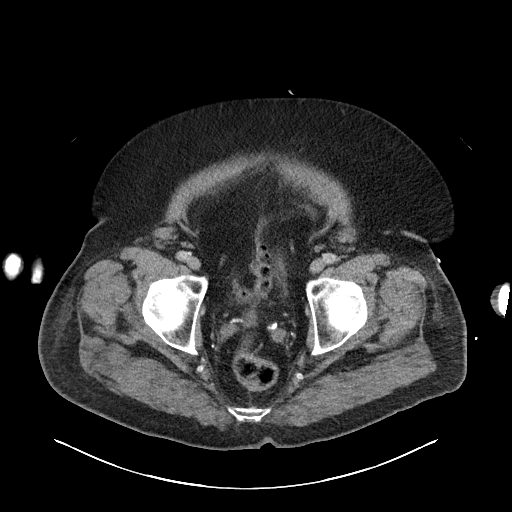
[im 28/98  soft-tissue]
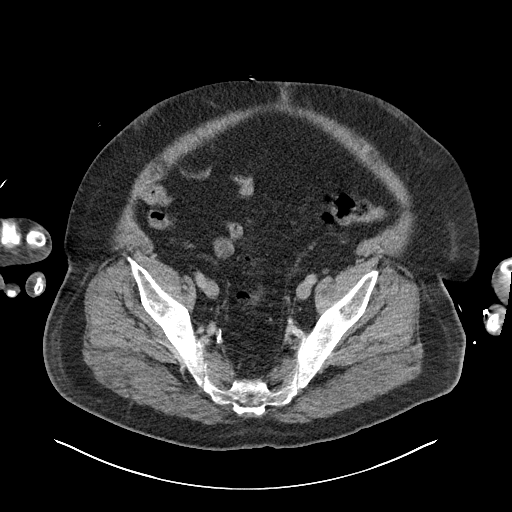
[im 35/98  soft-tissue]
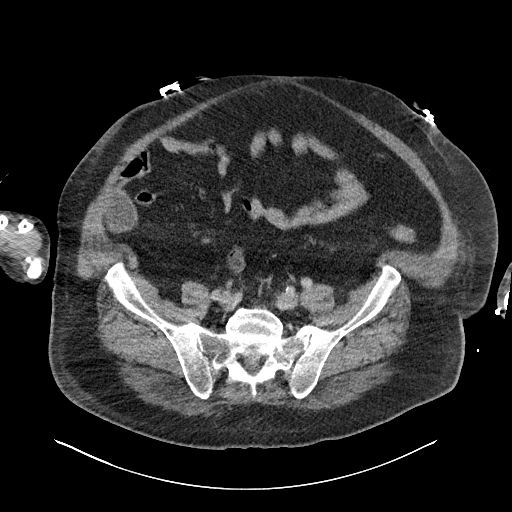
[im 42/98  soft-tissue]
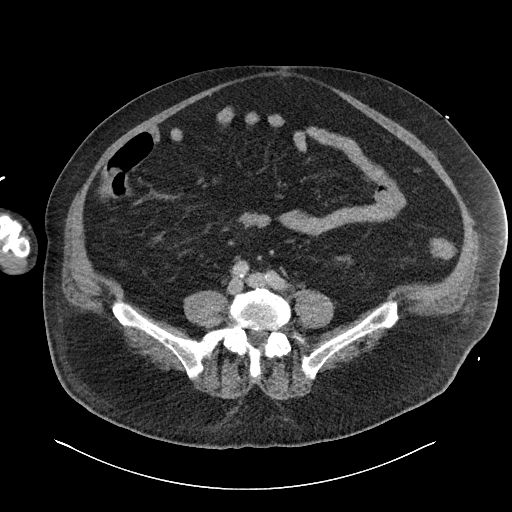
[im 49/98  soft-tissue]
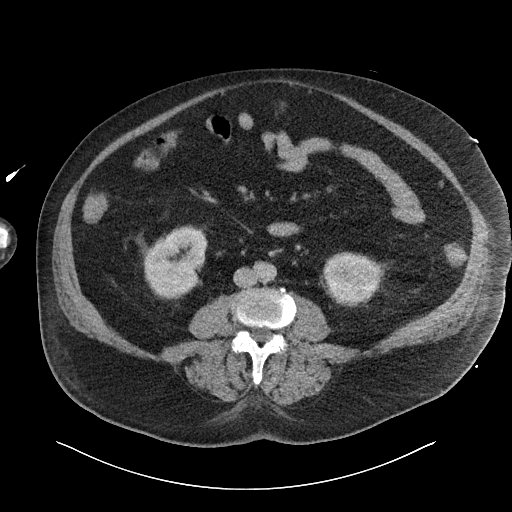
[im 56/98  soft-tissue]
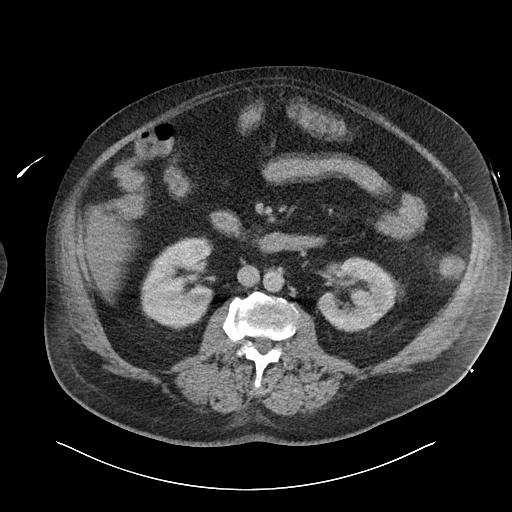
[im 63/98  soft-tissue]
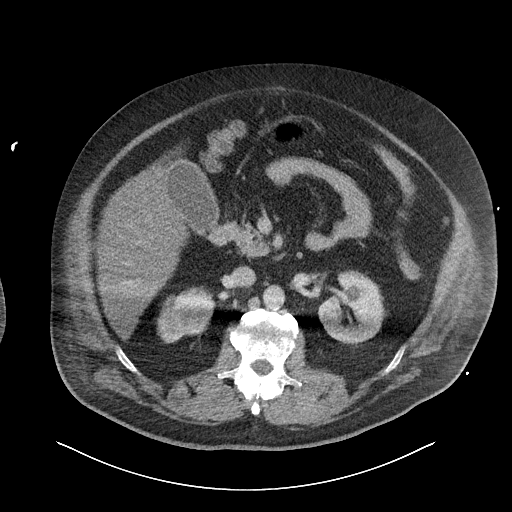
[im 63/98  bone]
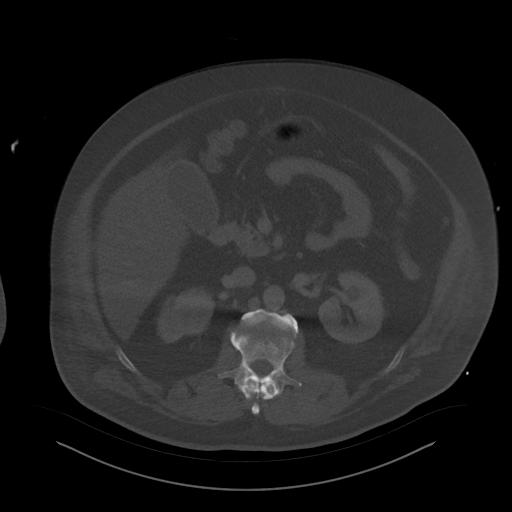
[im 70/98  soft-tissue]
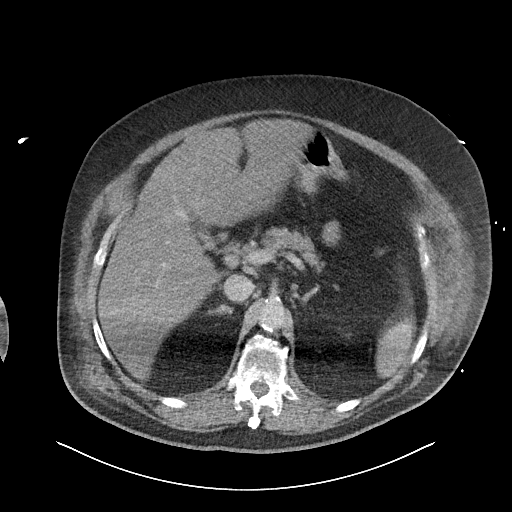
[im 77/98  soft-tissue]
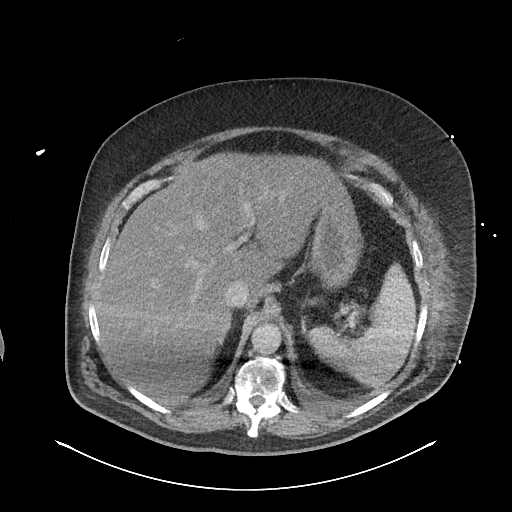
[im 84/98  soft-tissue]
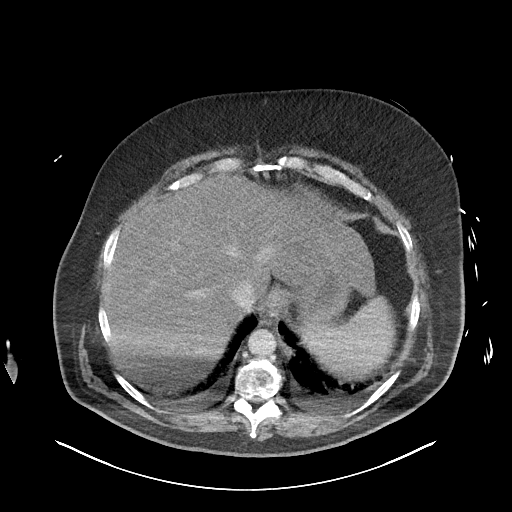
[im 91/98  soft-tissue]
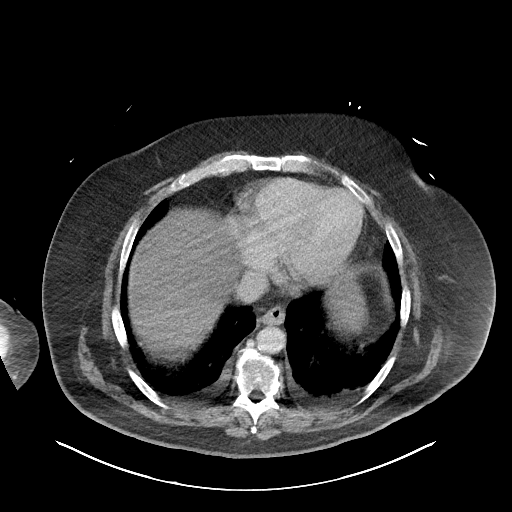

[Series 6: abdomen 3.0 mpr cor · coronal · 1.06mm/px · 3 of 119 slices shown]
[im 40/119  soft-tissue]
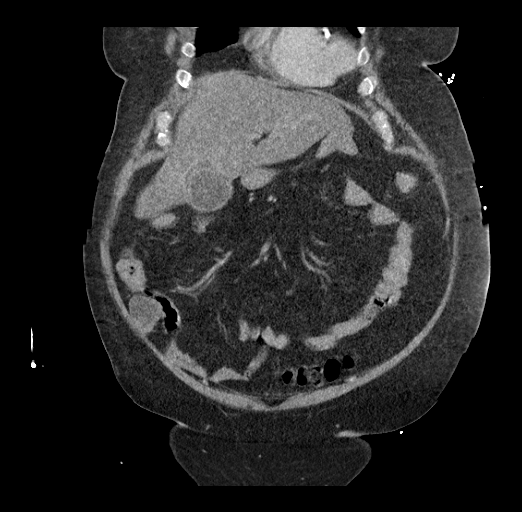
[im 53/119  soft-tissue]
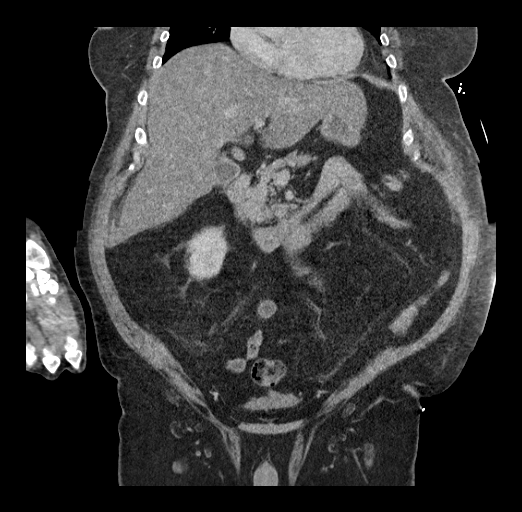
[im 66/119  soft-tissue]
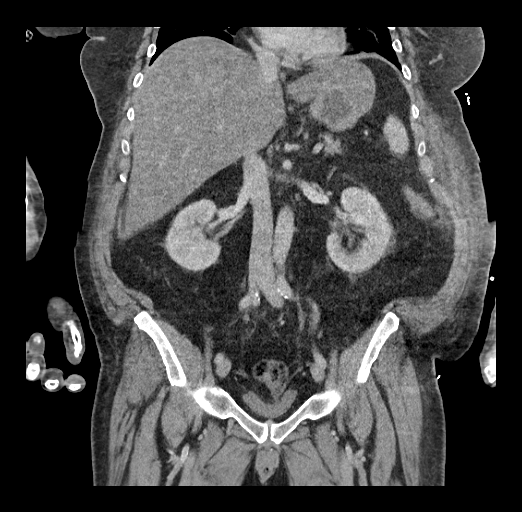

[16 of 46 positions shown; findings below may reference images not displayed]

FINDINGS: Lower chest: Bilateral small pleural effusions. No infiltrate lung
bases. Central venous congestion.

Hepatobiliary: Normal hepatic parenchyma.

The gallbladder wall is mildly thickened enhancing this is minimal
finding with the gallbladder wall measuring 2 mm. There is
pericholecystic fluid in the gallbladder fossa (image 38/3. Several
small gallstones are present. The gallbladder bladder is not
distended.

Common bile duct is normal caliber.

Pancreas: Pancreas is normal. No ductal dilatation. No pancreatic
inflammation.

Spleen: Stomach, small bowel, appendix, and cecum are normal. The
colon and rectosigmoid colon are normal.

Adrenals/urinary tract: Adrenal glands and kidneys are normal. The
ureters and bladder normal.

Foley catheter in bladder.

Stomach/Bowel: Stomach, small bowel, appendix, and cecum are normal.
The colon and rectosigmoid colon are normal.

Vascular/Lymphatic: Abdominal aorta is normal caliber with
atherosclerotic calcification. There is no retroperitoneal or
periportal lymphadenopathy. No pelvic lymphadenopathy.

Reproductive: Prostate normal.

Other: No free fluid.

Musculoskeletal: No aggressive osseous lesion.
IMPRESSION: 1. Cholelithiasis with gallbladder wall thickening and
pericholecystic fluid is concerning for ACUTE CHOLECYSTITIS. No
significant gallbladder distension.
2. Common bile duct is normal.  No evidence of pancreatitis.

These results will be called to the ordering clinician or
representative by the Radiologist Assistant, and communication
documented in the PACS or zVision Dashboard.

## 2019-05-09 IMAGING — DX PORTABLE CHEST - 1 VIEW
1 series · 1 of 1 positions shown · non-contrast
Comparison: [DATE]

CLINICAL DATA: Shortness of breath.

EXAM:
PORTABLE CHEST 1 VIEW

[chest ap]
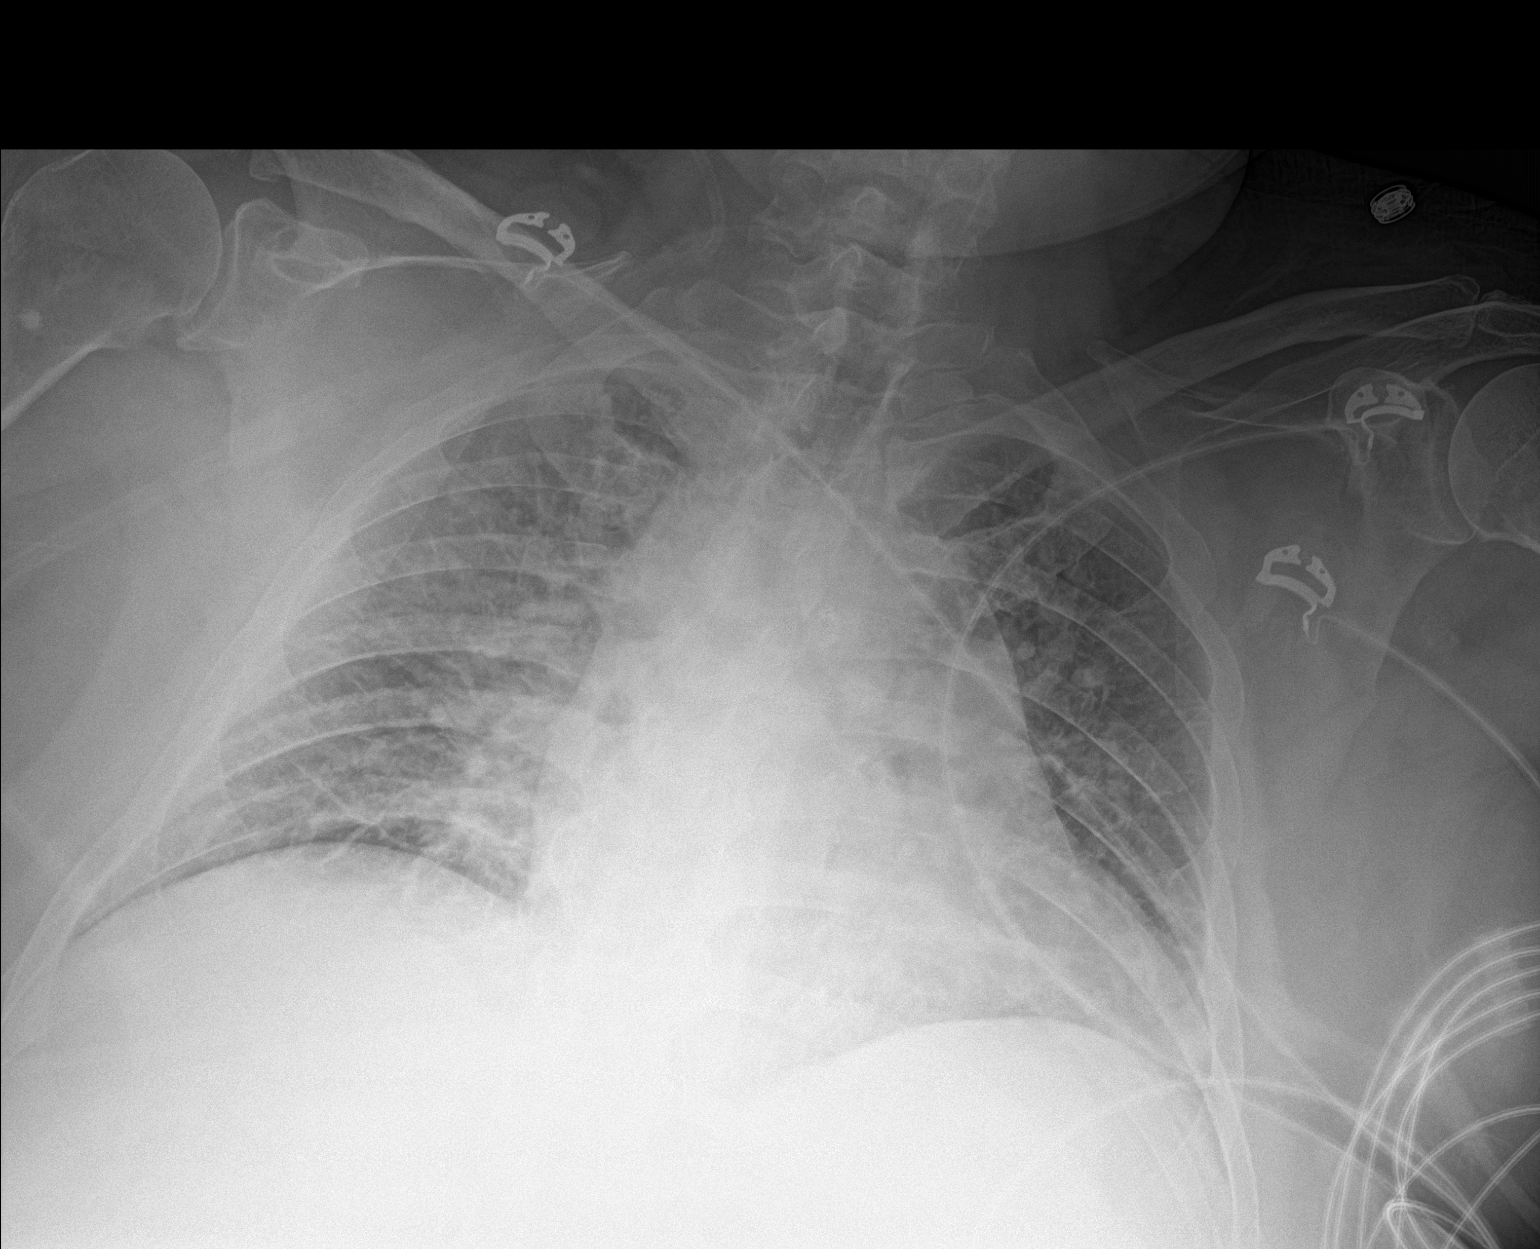

[1 of 1 positions shown; findings below may reference images not displayed]

FINDINGS: Heart size remains stable. New diffuse interstitial and airspace
opacities seen throughout both lungs, suspicious for diffuse
pulmonary edema. No evidence of focal consolidation or definite
pleural effusion.
IMPRESSION: New diffuse interstitial and airspace opacities, likely due to
diffuse pulmonary edema.

## 2019-05-09 MED ORDER — MAGNESIUM SULFATE 2 GM/50ML IV SOLN
2.0000 g | Freq: Once | INTRAVENOUS | Status: AC
  Administered 2019-05-09: 2 g via INTRAVENOUS
  Filled 2019-05-09: qty 50

## 2019-05-09 MED ORDER — IPRATROPIUM-ALBUTEROL 0.5-2.5 (3) MG/3ML IN SOLN
3.0000 mL | RESPIRATORY_TRACT | Status: DC | PRN
  Filled 2019-05-09: qty 3

## 2019-05-09 MED ORDER — KETOROLAC TROMETHAMINE 30 MG/ML IJ SOLN
30.0000 mg | Freq: Once | INTRAMUSCULAR | Status: AC
  Administered 2019-05-09: 30 mg via INTRAVENOUS
  Filled 2019-05-09: qty 1

## 2019-05-09 MED ORDER — INSULIN DETEMIR 100 UNIT/ML ~~LOC~~ SOLN
20.0000 [IU] | Freq: Every day | SUBCUTANEOUS | Status: DC
  Administered 2019-05-09: 20 [IU] via SUBCUTANEOUS
  Filled 2019-05-09 (×2): qty 0.2

## 2019-05-09 MED ORDER — VANCOMYCIN HCL 10 G IV SOLR: 1250.0000 mg | INTRAVENOUS | Status: DC

## 2019-05-09 MED ORDER — ENOXAPARIN SODIUM 30 MG/0.3ML ~~LOC~~ SOLN
30.0000 mg | SUBCUTANEOUS | Status: DC
  Administered 2019-05-09: 30 mg via SUBCUTANEOUS
  Filled 2019-05-09: qty 0.3

## 2019-05-09 MED ORDER — DEXMEDETOMIDINE HCL IN NACL 200 MCG/50ML IV SOLN
0.4000 ug/kg/h | INTRAVENOUS | Status: DC
  Filled 2019-05-09: qty 50

## 2019-05-09 MED ORDER — ACETAMINOPHEN 650 MG RE SUPP
325.0000 mg | Freq: Once | RECTAL | Status: AC
  Administered 2019-05-09: 325 mg via RECTAL
  Filled 2019-05-09: qty 1

## 2019-05-09 MED ORDER — FUROSEMIDE 10 MG/ML IJ SOLN
80.0000 mg | Freq: Two times a day (BID) | INTRAMUSCULAR | Status: DC
  Administered 2019-05-09: 80 mg via INTRAVENOUS
  Filled 2019-05-09: qty 8

## 2019-05-09 MED ORDER — IOHEXOL 300 MG/ML  SOLN
100.0000 mL | Freq: Once | INTRAMUSCULAR | Status: AC | PRN
  Administered 2019-05-09: 100 mL via INTRAVENOUS

## 2019-05-09 MED ORDER — FUROSEMIDE 10 MG/ML IJ SOLN
40.0000 mg | Freq: Once | INTRAMUSCULAR | Status: AC
  Administered 2019-05-09: 04:00:00 40 mg via INTRAVENOUS
  Filled 2019-05-09: qty 4

## 2019-05-09 MED ORDER — HALOPERIDOL LACTATE 5 MG/ML IJ SOLN
1.0000 mg | Freq: Four times a day (QID) | INTRAMUSCULAR | Status: DC | PRN
  Administered 2019-05-09: 1 mg via INTRAMUSCULAR
  Filled 2019-05-09: qty 1

## 2019-05-09 MED ORDER — FUROSEMIDE 10 MG/ML IJ SOLN
40.0000 mg | Freq: Once | INTRAMUSCULAR | Status: AC
  Administered 2019-05-09: 40 mg via INTRAVENOUS
  Filled 2019-05-09: qty 4

## 2019-05-09 MED ORDER — METOPROLOL TARTRATE 5 MG/5ML IV SOLN
5.0000 mg | Freq: Once | INTRAVENOUS | Status: AC
  Administered 2019-05-09: 5 mg via INTRAVENOUS
  Filled 2019-05-09: qty 5

## 2019-05-09 MED ORDER — HALOPERIDOL 0.5 MG PO TABS
0.5000 mg | ORAL_TABLET | Freq: Three times a day (TID) | ORAL | Status: DC | PRN
  Filled 2019-05-09: qty 1

## 2019-05-09 MED ORDER — LORAZEPAM 2 MG/ML IJ SOLN
1.0000 mg | Freq: Once | INTRAMUSCULAR | Status: AC
  Administered 2019-05-09: 1 mg via INTRAVENOUS
  Filled 2019-05-09: qty 1

## 2019-05-09 MED ORDER — DEXMEDETOMIDINE HCL IN NACL 400 MCG/100ML IV SOLN
0.4000 ug/kg/h | INTRAVENOUS | Status: DC
  Administered 2019-05-09: 0.4 ug/kg/h via INTRAVENOUS
  Administered 2019-05-09: 0.6 ug/kg/h via INTRAVENOUS
  Administered 2019-05-10: 0.5 ug/kg/h via INTRAVENOUS
  Filled 2019-05-09 (×3): qty 100

## 2019-05-09 MED ORDER — LACTATED RINGERS IV BOLUS
1000.0000 mL | Freq: Once | INTRAVENOUS | Status: AC
  Administered 2019-05-09: 1000 mL via INTRAVENOUS

## 2019-05-09 NOTE — Progress Notes (Signed)
Pt. Running temp.  102.6.  Gave Tylenol 650mg  Suppository.  Will monitor temp.  Pt still very restless.

## 2019-05-09 NOTE — Progress Notes (Signed)
Paged by bedside RN regarding increased MEWS and overall status. At bedside to assess pt was arousable to voice and restless. Tachypneic, tachycardic and febrile with a temp of 102.6. Lung sounds: wheezing, and fine crackles bilaterally. Was on 4L via [place] but was not maintaining his 02 sats so was placed on 11L [place] [place].    Abd X-ray was negative and CXR showed "diffuse pulmonary edema." ABG was obtained as well:  Ref. Range 05/09/2019 03:35  Sample type Unknown ARTERIAL DRAW  O2 Content Latest Units: L/min 5.0  pH, Arterial Latest Ref Range: 7.350 - 7.450  7.380  pCO2 arterial Latest Ref Range: 32.0 - 48.0 mmHg 43.3  pO2, Arterial Latest Ref Range: 83.0 - 108.0 mmHg 59.1 (L)  Acid-Base Excess Latest Ref Range: 0.0 - 2.0 mmol/L 0.3  Bicarbonate Latest Ref Range: 20.0 - 28.0 mmol/L 24.3  O2 Saturation Latest Units: % 84.3  Patient temperature Unknown 102.6  Collection site Unknown RIGHT RADIAL  Allens test (pass/fail) Latest Ref Range: PASS  PASS     Ordered Lasix 80mg  IV x 1, Metoprolol 5mg  IV x 1, Cooling Blanket, Foley Catheter, and 325mg  Tylenol as pt has had 650mg  already. Upon exiting the room pts VS had improved and was currently resting.  Stevie Kern AGPCNP-BC, AGNP-C [facility] Pager [phone]

## 2019-05-09 NOTE — Progress Notes (Signed)
PT not looking so good and MEWS score is going up.  Rapid Response came by twice to give a look and overnight coverage was paged anc came by to evaluate patient.  Wrote some orders and interventions.  Ordered more Tylenol, Lopressor 5mg , Lasix 80mg  IV, Ketorolac 30mg  IV, ABGs, and abd and Chest Xrays.

## 2019-05-09 NOTE — Progress Notes (Signed)
Received order for PICC.  At present patient restless and agitated.  Plan to place in am.  Placed second PIV 20ga 1.88 with ultrasound.  If more access needed before am please contact IV team.

## 2019-05-09 NOTE — Progress Notes (Addendum)
Patient quite agitated. CXR- diffuse pulm edema.  Still dyspnic and tachypnea. Unsafe for BiPAP.   CT A/P showed cholecystitis. Gen Surg consulted- recommends medical management.   Critical Care consulted given dyspnea, AMS, Sepsis despite earlier mention therapies.  Appreciate Input from GI, Surgery and Critical care team.   Wife notified and updated by me  Patient will be tx to ICU.

## 2019-05-09 NOTE — Consult Note (Signed)
Reason for Consult:abd [aom Referring Physician: Dr Dorethea Clan is an 58 y.o. male.  HPI: 51 yom who has pmh of cad, cva, dm who came to er two days ago with altered mental status and weakness. He is not able to give me any history at this point so it is all from the chart.  Since admission he was noted to have elevated lfts including tb of 2.3 (now 5.1) with lipase of 1276.  He has a ruq u/s that hosws 9 mm stone in the gallbladder neck ( or sludge) with normal wall, no fluid and no murphys sign sonographically.  A ct scan shows 2 mm wall, pericholecystic fluid with some stones and not distended. He is hypoxic on blood gas, tachycardic with mental status changes now.     Past Medical History:  Diagnosis Date  . Chronic neck and back pain   . Coronary artery disease    drug-eluting stents placed in proximal and mid LAD  . Deviated septum   . Diabetic retinopathy (HCC)   . GERD (gastroesophageal reflux disease)    "occasionally" (03/23/2015)  . Headache    "more than 2/wk" (03/23/2015)  . Heart attack (HCC)    "in 1997 was told I had signs of a small heart attack that I didn't know I'd had"  . High cholesterol   . History of gout    "when I was younger"  . Hypertension   . Kidney stones   . Migraine    "had my 1st and only in 12/2014" (03/23/2015)  . Peripheral vascular disease (HCC)    carotid artery disease  . PONV (postoperative nausea and vomiting)    "was told I was confused and combative in recovery from the narcotics w/my carotid OR"  . Sleep apnea    "severe; couldn't afford mask" (03/23/2015)  . Stroke [facility]) 2014   "on walker for 2 months; left side is weaker and numb since" (03/23/2015)  . Type II diabetes mellitus (HCC)     Past Surgical History:  Procedure Laterality Date  . CIRCUMCISION  1981  . CORONARY ANGIOPLASTY WITH STENT PLACEMENT  03/23/2015   "2"  . CORONARY STENT INTERVENTION N/A 09/29/2017   Procedure: CORONARY STENT INTERVENTION;  Surgeon: Runell Gess, MD;  Location: [facility] INVASIVE CV LAB;  Service: Cardiovascular;  Laterality: N/A;  . ENDARTERECTOMY Right 09/26/2014   Procedure: RIGHT CAROTID ENDARTERECTOMY WITH PATCH ANGIOPLASTY;  Surgeon: Fransisco Hertz, MD;  Location: [facility] OR;  Service: Vascular;  Laterality: Right;  . EYE SURGERY Right May 2016   Cataract  . EYE SURGERY Left July 2016   Cataract  . HYDROCELE EXCISION  ~ 2008  . LEFT HEART CATH AND CORONARY ANGIOGRAPHY N/A 09/29/2017   Procedure: LEFT HEART CATH AND CORONARY ANGIOGRAPHY;  Surgeon: Runell Gess, MD;  Location: [facility] INVASIVE CV LAB;  Service: Cardiovascular;  Laterality: N/A;  . LEFT HEART CATHETERIZATION WITH CORONARY ANGIOGRAM N/A 03/23/2015   Procedure: LEFT HEART CATHETERIZATION WITH CORONARY ANGIOGRAM;  Surgeon: Runell Gess, MD;  Location: [facility] CATH LAB;  Service: Cardiovascular;  Laterality: N/A;  . REFRACTIVE SURGERY Bilateral 08/2014-09/2014   Diabetic retinopathy     Family History  Problem Relation Age of Onset  . Heart disease Mother   . Diabetes Mother   . Hyperlipidemia Mother   . Hypertension Mother   . Cancer Father        Lung Cancer   . Heart disease Father   . Heart attack  Father   . Gout Brother   . Asthma Brother   . Hyperlipidemia Brother   . Hypertension Brother   . Diabetes Brother   . Colon cancer Brother        colon  . Stroke Paternal Grandfather   . Colon cancer Sister   . Hyperlipidemia Sister   . Hypertension Sister   . Gout Maternal Uncle   . Diabetes Maternal Grandfather   . Diabetes Daughter     Social History:  reports that he has never smoked. He has never used smokeless tobacco. He reports that he does not drink alcohol or use drugs.  Allergies: No Known Allergies  Medications: I have reviewed the patient's current medications.  Results for orders placed or performed during the hospital encounter of 05/07/19 (from the past 48 hour(s))  CBG monitoring, ED     Status: Abnormal   Collection Time: 05/07/19   8:33 PM  Result Value Ref Range   Glucose-Capillary 353 (H) 70 - 99 mg/dL  Lactic acid, plasma     Status: Abnormal   Collection Time: 05/07/19  8:52 PM  Result Value Ref Range   Lactic Acid, Venous 3.9 (HH) 0.5 - 1.9 mmol/L    Comment: CRITICAL RESULT CALLED TO, READ BACK BY AND VERIFIED WITH: B OSORIO 05/07/2019 AT 2115 BY H SOEWARDIMAN Performed at [facility] Lab, [address], [place], [place] [address]   Comprehensive metabolic panel     Status: Abnormal   Collection Time: 05/07/19  8:52 PM  Result Value Ref Range   Sodium 139 135 - 145 mmol/L   Potassium 3.8 3.5 - 5.1 mmol/L   Chloride 103 98 - 111 mmol/L   CO2 24 22 - 32 mmol/L   Glucose, Bld 373 (H) 70 - 99 mg/dL   BUN 30 (H) 6 - 20 mg/dL   Creatinine, Ser 6.04 (H) 0.61 - 1.24 mg/dL   Calcium 9.0 8.9 - 54.0 mg/dL   Total Protein 6.3 (L) 6.5 - 8.1 g/dL   Albumin 3.3 (L) 3.5 - 5.0 g/dL   AST 981 (H) 15 - 41 U/L    Comment: RESULTS CONFIRMED BY MANUAL DILUTION   ALT 194 (H) 0 - 44 U/L   Alkaline Phosphatase 86 38 - 126 U/L   Total Bilirubin 2.3 (H) 0.3 - 1.2 mg/dL   GFR calc non Af Amer >60 >60 mL/min   GFR calc Af Amer >60 >60 mL/min   Anion gap 12 5 - 15    Comment: Performed at [facility] Lab, [address], [place], [place] [address]  CBC WITH DIFFERENTIAL     Status: Abnormal   Collection Time: 05/07/19  8:52 PM  Result Value Ref Range   WBC 14.2 (H) 4.0 - 10.5 K/uL   RBC 4.83 4.22 - 5.81 MIL/uL   Hemoglobin 14.1 13.0 - 17.0 g/dL   HCT 82.9 56.2 - 13.0 %   MCV 91.5 80.0 - 100.0 fL   MCH 29.2 26.0 - 34.0 pg   MCHC 31.9 30.0 - 36.0 g/dL   RDW 86.5 78.4 - 69.6 %   Platelets 204 150 - 400 K/uL   nRBC 0.0 0.0 - 0.2 %   Neutrophils Relative % 91 %   Neutro Abs 12.9 (H) 1.7 - 7.7 K/uL   Lymphocytes Relative 3 %   Lymphs Abs 0.4 (L) 0.7 - 4.0 K/uL   Monocytes Relative 5 %   Monocytes Absolute 0.7 0.1 - 1.0 K/uL   Eosinophils Relative  0 %   Eosinophils Absolute 0.1 0.0 - 0.5 K/uL   Basophils Relative 0  %   Basophils Absolute 0.0 0.0 - 0.1 K/uL   Immature Granulocytes 1 %   Abs Immature Granulocytes 0.07 0.00 - 0.07 K/uL    Comment: Performed at [facility], [address] [place], [place] [address]  I-STAT 7, (LYTES, BLD GAS, ICA, H+H)     Status: Abnormal   Collection Time: 05/07/19  9:04 PM  Result Value Ref Range   pH, Arterial 7.437 7.350 - 7.450   pCO2 arterial 37.0 32.0 - 48.0 mmHg   pO2, Arterial 78.0 (L) 83.0 - 108.0 mmHg   Bicarbonate 25.0 20.0 - 28.0 mmol/L   TCO2 26 22 - 32 mmol/L   O2 Saturation 96.0 %   Acid-Base Excess 1.0 0.0 - 2.0 mmol/L   Sodium 140 135 - 145 mmol/L   Potassium 3.8 3.5 - 5.1 mmol/L   Calcium, Ion 1.19 1.15 - 1.40 mmol/L   HCT 40.0 39.0 - 52.0 %   Hemoglobin 13.6 13.0 - 17.0 g/dL   Patient temperature 47.0 F    Collection site RADIAL, ALLEN'S TEST ACCEPTABLE    Drawn by RT    Sample type ARTERIAL   Blood Culture (routine x 2)     Status: Abnormal (Preliminary result)   Collection Time: 05/07/19  9:11 PM  Result Value Ref Range   Specimen Description BLOOD LEFT HAND    Special Requests      BOTTLES DRAWN AEROBIC AND ANAEROBIC Blood Culture adequate volume   Culture  Setup Time      GRAM NEGATIVE RODS IN BOTH AEROBIC AND ANAEROBIC BOTTLES CRITICAL RESULT CALLED TO, READ BACK BY AND VERIFIED WITH: PHARMD ADREW Judie Petit [id number] FCP  Performed at [facility], [address] [place], [place] [address]    Culture KLEBSIELLA PNEUMONIAE (A)    Report Status PENDING   Blood Culture ID Panel (Reflexed)     Status: Abnormal   Collection Time: 05/07/19  9:11 PM  Result Value Ref Range   Enterococcus species NOT DETECTED NOT DETECTED   Listeria monocytogenes NOT DETECTED NOT DETECTED   Staphylococcus species NOT DETECTED NOT DETECTED   Staphylococcus aureus (BCID) NOT DETECTED NOT DETECTED   Streptococcus species NOT DETECTED NOT DETECTED   Streptococcus agalactiae NOT DETECTED NOT DETECTED   Streptococcus pneumoniae NOT DETECTED NOT  DETECTED   Streptococcus pyogenes NOT DETECTED NOT DETECTED   Acinetobacter baumannii NOT DETECTED NOT DETECTED   Enterobacteriaceae species DETECTED (A) NOT DETECTED    Comment: Enterobacteriaceae represent a large family of gram-negative bacteria, not a single organism. CRITICAL RESULT CALLED TO, READ BACK BY AND VERIFIED WITH: PHARMD ADREW M. [id number] FCP     Enterobacter cloacae complex NOT DETECTED NOT DETECTED   Escherichia coli NOT DETECTED NOT DETECTED   Klebsiella oxytoca NOT DETECTED NOT DETECTED   Klebsiella pneumoniae DETECTED (A) NOT DETECTED    Comment: CRITICAL RESULT CALLED TO, READ BACK BY AND VERIFIED WITH: PHARMD ADREW M. [id number] FCP     Proteus species NOT DETECTED NOT DETECTED   Serratia marcescens NOT DETECTED NOT DETECTED   Carbapenem resistance NOT DETECTED NOT DETECTED   Haemophilus influenzae NOT DETECTED NOT DETECTED   Neisseria meningitidis NOT DETECTED NOT DETECTED   Pseudomonas aeruginosa NOT DETECTED NOT DETECTED   Candida albicans NOT DETECTED NOT DETECTED   Candida glabrata NOT DETECTED NOT DETECTED   Candida krusei NOT DETECTED NOT DETECTED  Candida parapsilosis NOT DETECTED NOT DETECTED   Candida tropicalis NOT DETECTED NOT DETECTED    Comment: Performed at [facility], [address] [place], [place] [address]  SARS Coronavirus 2 (CEPHEID- Performed in [facility] hospital lab), Hosp Order     Status: None   Collection Time: 05/07/19  9:26 PM  Result Value Ref Range   SARS Coronavirus 2 NEGATIVE NEGATIVE    Comment: (NOTE) If result is NEGATIVE SARS-CoV-2 target nucleic acids are NOT DETECTED. The SARS-CoV-2 RNA is generally detectable in upper and lower  respiratory specimens during the acute phase of infection. The lowest  concentration of SARS-CoV-2 viral copies this assay can detect is 250  copies / mL. A negative result does not preclude SARS-CoV-2 infection  and should not be used as the sole basis for treatment or other   patient management decisions.  A negative result may occur with  improper specimen collection / handling, submission of specimen other  than nasopharyngeal swab, presence of viral mutation(s) within the  areas targeted by this assay, and inadequate number of viral copies  (<250 copies / mL). A negative result must be combined with clinical  observations, patient history, and epidemiological information. If result is POSITIVE SARS-CoV-2 target nucleic acids are DETECTED. The SARS-CoV-2 RNA is generally detectable in upper and lower  respiratory specimens dur ing the acute phase of infection.  Positive  results are indicative of active infection with SARS-CoV-2.  Clinical  correlation with patient history and other diagnostic information is  necessary to determine patient infection status.  Positive results do  not rule out bacterial infection or co-infection with other viruses. If result is PRESUMPTIVE POSTIVE SARS-CoV-2 nucleic acids MAY BE PRESENT.   A presumptive positive result was obtained on the submitted specimen  and confirmed on repeat testing.  While 2019 novel coronavirus  (SARS-CoV-2) nucleic acids may be present in the submitted sample  additional confirmatory testing may be necessary for epidemiological  and / or clinical management purposes  to differentiate between  SARS-CoV-2 and other Sarbecovirus currently known to infect humans.  If clinically indicated additional testing with an alternate test  methodology [phone]([id number]) is advised. The SARS-CoV-2 RNA is generally  detectable in upper and lower respiratory sp ecimens during the acute  phase of infection. The expected result is Negative. Fact Sheet for Patients:  BoilerBrush.com.cyhttps://www.fda.gov/media/136312/download Fact Sheet for Healthcare Providers: https://pope.com/https://www.fda.gov/media/136313/download This test is not yet approved or cleared by the [place] FDA and has been authorized for detection and/or diagnosis of SARS-CoV-2  by FDA under an Emergency Use Authorization (EUA).  This EUA will remain in effect (meaning this test can be used) for the duration of the COVID-19 declaration under Section 564(b)(1) of the Act, 21 U.S.C. section 360bbb-3(b)(1), unless the authorization is terminated or revoked sooner. Performed at [facility], [address] [place], [place] [address]   Blood Culture (routine x 2)     Status: Abnormal (Preliminary result)   Collection Time: 05/07/19  9:44 PM  Result Value Ref Range   Specimen Description BLOOD BLOOD LEFT FOREARM    Special Requests      BOTTLES DRAWN AEROBIC AND ANAEROBIC Blood Culture adequate volume   Culture  Setup Time      GRAM NEGATIVE RODS IN BOTH AEROBIC AND ANAEROBIC BOTTLES CRITICAL RESULT CALLED TO, READ BACK BY AND VERIFIED WITH: T GREEN,PHARMD AT 1738 05/08/2019 BY L BENFIELD Performed at [facility], [address] [place], [place]  [address]    Culture KLEBSIELLA PNEUMONIAE (A)    Report Status PENDING   Lactic acid, plasma     Status: Abnormal   Collection Time: 05/07/19 10:49 PM  Result Value Ref Range   Lactic Acid, Venous 3.2 (HH) 0.5 - 1.9 mmol/L    Comment: CRITICAL RESULT CALLED TO, READ BACK BY AND VERIFIED WITH: B OSORIO 05/08/2019 AT 0039 BY H SOEWARDIMAN Performed at [facility], [address] [place], [place] [address]   Urine culture     Status: None   Collection Time: 05/07/19 11:10 PM  Result Value Ref Range   Specimen Description URINE, RANDOM    Special Requests NONE    Culture      NO GROWTH Performed at [facility] Lab, [address], [place], [place] [address]    Report Status 05/09/2019 FINAL   Urinalysis, Routine w reflex microscopic     Status: Abnormal   Collection Time: 05/07/19 11:20 PM  Result Value Ref Range   Color, Urine YELLOW YELLOW   APPearance HAZY (A) CLEAR   Specific Gravity, Urine 1.021 1.005 - 1.030   pH 5.0 5.0 - 8.0   Glucose, UA >=500 (A) NEGATIVE mg/dL   Hgb urine  dipstick NEGATIVE NEGATIVE   Bilirubin Urine NEGATIVE NEGATIVE   Ketones, ur NEGATIVE NEGATIVE mg/dL   Protein, ur NEGATIVE NEGATIVE mg/dL   Nitrite NEGATIVE NEGATIVE   Leukocytes,Ua NEGATIVE NEGATIVE   RBC / HPF 0-5 0 - 5 RBC/hpf   WBC, UA 6-10 0 - 5 WBC/hpf   Bacteria, UA NONE SEEN NONE SEEN   Squamous Epithelial / LPF 0-5 0 - 5   Mucus PRESENT    Hyaline Casts, UA PRESENT     Comment: Performed at [facility], [address], [place], [place] [address]  Glucose, capillary     Status: Abnormal   Collection Time: 05/08/19  1:02 AM  Result Value Ref Range   Glucose-Capillary 349 (H) 70 - 99 mg/dL  Lactic acid, plasma     Status: Abnormal   Collection Time: 05/08/19  2:05 AM  Result Value Ref Range   Lactic Acid, Venous 3.7 (HH) 0.5 - 1.9 mmol/L    Comment: CRITICAL RESULT CALLED TO, READ BACK BY AND VERIFIED WITH: CARSON,J RN 05/08/2019 0256 JORDANS Performed at [facility] Lab, [address], [place], [place] [address]   CBC     Status: Abnormal   Collection Time: 05/08/19  5:13 AM  Result Value Ref Range   WBC 17.8 (H) 4.0 - 10.5 K/uL   RBC 4.32 4.22 - 5.81 MIL/uL   Hemoglobin 12.5 (L) 13.0 - 17.0 g/dL   HCT 13.0 (L) 86.5 - 78.4 %   MCV 89.4 80.0 - 100.0 fL   MCH 28.9 26.0 - 34.0 pg   MCHC 32.4 30.0 - 36.0 g/dL   RDW 69.6 (H) 29.5 - 28.4 %   Platelets 204 150 - 400 K/uL   nRBC 0.0 0.0 - 0.2 %    Comment: Performed at [facility], [address] [place], [place] [address]  Lactic acid, plasma     Status: Abnormal   Collection Time: 05/08/19  5:13 AM  Result Value Ref Range   Lactic Acid, Venous 4.1 (HH) 0.5 - 1.9 mmol/L    Comment: CRITICAL RESULT CALLED TO, READ BACK BY AND VERIFIED WITH: BOKAIGON,C RN 05/08/2019 0549 JORDANS Performed at [facility], [address] [place], [place] [address]  Comprehensive metabolic panel     Status: Abnormal   Collection Time: 05/08/19  5:13 AM  Result Value Ref Range   Sodium 141 135 - 145 mmol/L    Potassium 3.8 3.5 - 5.1 mmol/L   Chloride 104 98 - 111 mmol/L   CO2 21 (L) 22 - 32 mmol/L   Glucose, Bld 395 (H) 70 - 99 mg/dL   BUN 33 (H) 6 - 20 mg/dL   Creatinine, Ser 1.19 (H) 0.61 - 1.24 mg/dL   Calcium 8.7 (L) 8.9 - 10.3 mg/dL   Total Protein 6.2 (L) 6.5 - 8.1 g/dL   Albumin 3.0 (L) 3.5 - 5.0 g/dL   AST 147 (H) 15 - 41 U/L   ALT 414 (H) 0 - 44 U/L   Alkaline Phosphatase 75 38 - 126 U/L   Total Bilirubin 3.5 (H) 0.3 - 1.2 mg/dL   GFR calc non Af Amer 50 (L) >60 mL/min   GFR calc Af Amer 58 (L) >60 mL/min   Anion gap 16 (H) 5 - 15    Comment: Performed at [facility], [address] [place], [place] [address]  Glucose, capillary     Status: Abnormal   Collection Time: 05/08/19  7:46 AM  Result Value Ref Range   Glucose-Capillary 353 (H) 70 - 99 mg/dL  Glucose, capillary     Status: Abnormal   Collection Time: 05/08/19 11:11 AM  Result Value Ref Range   Glucose-Capillary 370 (H) 70 - 99 mg/dL  TSH     Status: None   Collection Time: 05/08/19 11:25 AM  Result Value Ref Range   TSH 1.714 0.350 - 4.500 uIU/mL    Comment: Performed by a 3rd Generation assay with a functional sensitivity of <=0.01 uIU/mL. Performed at [facility], [address] [place], [place] [address]   Ammonia     Status: None   Collection Time: 05/08/19 11:25 AM  Result Value Ref Range   Ammonia 18 9 - 35 umol/L    Comment: Performed at [facility], [address] [place], [place] [address]  Lactic acid, plasma     Status: Abnormal   Collection Time: 05/08/19 11:25 AM  Result Value Ref Range   Lactic Acid, Venous 4.2 (HH) 0.5 - 1.9 mmol/L    Comment: CRITICAL RESULT CALLED TO, READ BACK BY AND VERIFIED WITH: N.CHAVIS,RN @ 1159 05/08/2019 WEBBERJ Performed at [facility], [address] [place], [place] [address]   Procalcitonin - Baseline     Status: None   Collection Time: 05/08/19 11:25 AM  Result Value Ref Range   Procalcitonin 22.96 ng/mL    Comment:         Interpretation: PCT >= 10 ng/mL: Important systemic inflammatory response, almost exclusively due to severe bacterial sepsis or septic shock. (NOTE)       Sepsis PCT Algorithm           Lower Respiratory Tract                                      Infection PCT Algorithm    ----------------------------     ----------------------------         PCT < 0.25 ng/mL                PCT < 0.10 ng/mL         Strongly encourage  Strongly discourage   discontinuation of antibiotics    initiation of antibiotics    ----------------------------     -----------------------------       PCT 0.25 - 0.50 ng/mL            PCT 0.10 - 0.25 ng/mL               OR       >80% decrease in PCT            Discourage initiation of                                            antibiotics      Encourage discontinuation           of antibiotics    ----------------------------     -----------------------------         PCT >= 0.50 ng/mL              PCT 0.26 - 0.50 ng/mL                AND       <80% decrease in PCT             Encourage initiation of                                             antibiotics       Encourage continuation           of antibiotics    ----------------------------     -----------------------------        PCT >= 0.50 ng/mL                  PCT > 0.50 ng/mL               AND         increase in PCT                  Strongly encourage                                      initiation of antibiotics    Strongly encourage escalation           of antibiotics                                     -----------------------------                                           PCT <= 0.25 ng/mL                                                 OR                                        >  80% decrease in PCT                                     Discontinue / Do not initiate                                             antibiotics Performed at [facility], [address] [place],  [place] [address]   Blood gas, arterial     Status: Abnormal   Collection Time: 05/08/19  1:07 PM  Result Value Ref Range   O2 Content 3.0 L/min   Delivery systems NASAL CANNULA    pH, Arterial 7.455 (H) 7.350 - 7.450   pCO2 arterial 34.6 32.0 - 48.0 mmHg   pO2, Arterial 71.8 (L) 83.0 - 108.0 mmHg   Bicarbonate 24.0 20.0 - 28.0 mmol/L   Acid-Base Excess 0.5 0.0 - 2.0 mmol/L   O2 Saturation 92.9 %   Patient temperature 98.6    Collection site LEFT RADIAL    Sample type ARTERIAL DRAW    Allens test (pass/fail) PASS PASS  Lactic acid, plasma     Status: Abnormal   Collection Time: 05/08/19  2:35 PM  Result Value Ref Range   Lactic Acid, Venous 3.1 (HH) 0.5 - 1.9 mmol/L    Comment: CRITICAL RESULT CALLED TO, READ BACK BY AND VERIFIED WITH: CHAMBERLAIN, N RN @ 1524 ON 05/08/2019 BY TEMOCHE, H Performed at [facility], [address] [place], [place] [address]   Respiratory Panel by PCR     Status: None   Collection Time: 05/08/19  4:57 PM  Result Value Ref Range   Adenovirus NOT DETECTED NOT DETECTED   Coronavirus 229E NOT DETECTED NOT DETECTED    Comment: (NOTE) The Coronavirus on the Respiratory Panel, DOES NOT test for the novel  Coronavirus (2019 nCoV)    Coronavirus HKU1 NOT DETECTED NOT DETECTED   Coronavirus NL63 NOT DETECTED NOT DETECTED   Coronavirus OC43 NOT DETECTED NOT DETECTED   Metapneumovirus NOT DETECTED NOT DETECTED   Rhinovirus / Enterovirus NOT DETECTED NOT DETECTED   Influenza A NOT DETECTED NOT DETECTED   Influenza B NOT DETECTED NOT DETECTED   Parainfluenza Virus 1 NOT DETECTED NOT DETECTED   Parainfluenza Virus 2 NOT DETECTED NOT DETECTED   Parainfluenza Virus 3 NOT DETECTED NOT DETECTED   Parainfluenza Virus 4 NOT DETECTED NOT DETECTED   Respiratory Syncytial Virus NOT DETECTED NOT DETECTED   Bordetella pertussis NOT DETECTED NOT DETECTED   Chlamydophila pneumoniae NOT DETECTED NOT DETECTED   Mycoplasma pneumoniae NOT DETECTED NOT DETECTED     Comment: Performed at [facility] Lab, [address], [place], [place] [address]  Glucose, capillary     Status: Abnormal   Collection Time: 05/08/19  5:50 PM  Result Value Ref Range   Glucose-Capillary 290 (H) 70 - 99 mg/dL  Glucose, capillary     Status: Abnormal   Collection Time: 05/08/19 11:27 PM  Result Value Ref Range   Glucose-Capillary 258 (H) 70 - 99 mg/dL  Blood gas, arterial     Status: Abnormal   Collection Time: 05/09/19  3:35 AM  Result Value Ref Range   O2 Content 5.0 L/min   pH, Arterial 7.380 7.350 - 7.450   pCO2 arterial 43.3 32.0 -  48.0 mmHg   pO2, Arterial 59.1 (L) 83.0 - 108.0 mmHg   Bicarbonate 24.3 20.0 - 28.0 mmol/L   Acid-Base Excess 0.3 0.0 - 2.0 mmol/L   O2 Saturation 84.3 %   Patient temperature 102.6    Collection site RIGHT RADIAL    Drawn by [id number]    Sample type ARTERIAL DRAW    Allens test (pass/fail) PASS PASS  Comprehensive metabolic panel     Status: Abnormal   Collection Time: 05/09/19  4:08 AM  Result Value Ref Range   Sodium 147 (H) 135 - 145 mmol/L   Potassium 3.5 3.5 - 5.1 mmol/L   Chloride 110 98 - 111 mmol/L   CO2 23 22 - 32 mmol/L   Glucose, Bld 353 (H) 70 - 99 mg/dL   BUN 18 6 - 20 mg/dL   Creatinine, Ser 8.88 (H) 0.61 - 1.24 mg/dL   Calcium 8.2 (L) 8.9 - 10.3 mg/dL   Total Protein 6.6 6.5 - 8.1 g/dL   Albumin 2.8 (L) 3.5 - 5.0 g/dL   AST 280 (H) 15 - 41 U/L   ALT 460 (H) 0 - 44 U/L   Alkaline Phosphatase 87 38 - 126 U/L   Total Bilirubin 5.1 (H) 0.3 - 1.2 mg/dL   GFR calc non Af Amer 58 (L) >60 mL/min   GFR calc Af Amer >60 >60 mL/min   Anion gap 14 5 - 15    Comment: Performed at [facility], [address], [place], [place] [address]  CBC     Status: Abnormal   Collection Time: 05/09/19  4:08 AM  Result Value Ref Range   WBC 8.9 4.0 - 10.5 K/uL   RBC 4.47 4.22 - 5.81 MIL/uL   Hemoglobin 13.0 13.0 - 17.0 g/dL   HCT 79.1 50.5 - 69.7 %   MCV 91.1 80.0 - 100.0 fL   MCH 29.1 26.0 - 34.0 pg   MCHC 31.9 30.0 -  36.0 g/dL   RDW 94.8 (H) 01.6 - 55.3 %   Platelets 155 150 - 400 K/uL   nRBC 0.0 0.0 - 0.2 %    Comment: Performed at [facility], [address], [place], [place] [address]  Magnesium     Status: None   Collection Time: 05/09/19  4:08 AM  Result Value Ref Range   Magnesium 1.7 1.7 - 2.4 mg/dL    Comment: Performed at [facility], [address] [place], [place] [address]  Brain natriuretic peptide     Status: Abnormal   Collection Time: 05/09/19  4:08 AM  Result Value Ref Range   B Natriuretic Peptide 223.8 (H) 0.0 - 100.0 pg/mL    Comment: Performed at [facility], [address] [place], [place] [address]  Lactic acid, plasma     Status: None   Collection Time: 05/09/19  4:15 AM  Result Value Ref Range   Lactic Acid, Venous 1.9 0.5 - 1.9 mmol/L    Comment: Performed at [facility], [address], [place], [place] [address]  Glucose, capillary     Status: Abnormal   Collection Time: 05/09/19  6:21 AM  Result Value Ref Range   Glucose-Capillary 359 (H) 70 - 99 mg/dL  Glucose, capillary     Status: Abnormal   Collection Time: 05/09/19  8:23 AM  Result Value Ref Range   Glucose-Capillary 396 (H) 70 - 99 mg/dL  Glucose, capillary     Status: Abnormal  Collection Time: 05/09/19 12:40 PM  Result Value Ref Range   Glucose-Capillary 330 (H) 70 - 99 mg/dL    Ct Head Wo Contrast  Result Date: 05/08/2019 CLINICAL DATA:  Altered mental status.  Combative. EXAM: CT HEAD WITHOUT CONTRAST TECHNIQUE: Contiguous axial images were obtained from the base of the skull through the vertex without intravenous contrast. COMPARISON:  None. FINDINGS: Brain: Motion degradation of the exam. Exam was repeated with minimal improvement. No gross evidence intracranial hemorrhage. No midline shift or mass effect. Difficult to exclude cortical infarction with the degree of motion. Basal cisterns are patent. Vascular: No hyperdense vessel or unexpected calcification. Skull: Normal.  Negative for fracture or focal lesion. Sinuses/Orbits: Paranasal sinuses and mastoid air cells are clear. Orbits are clear. Other: None. IMPRESSION: Exam is extremely degraded by head motion. Exam was repeated with little improvement. No gross abnormality. Electronically Signed   By: Genevive Bi M.D.   On: 05/08/2019 19:34   Ct Abdomen Pelvis W Contrast  Result Date: 05/09/2019 CLINICAL DATA:  Fever.  Concern for pancreatitis EXAM: CT ABDOMEN AND PELVIS WITH CONTRAST TECHNIQUE: Multidetector CT imaging of the abdomen and pelvis was performed using the standard protocol following bolus administration of intravenous contrast. CONTRAST:  OMNIPAQUE IOHEXOL 300 MG/ML  SOLN COMPARISON:  None. FINDINGS: Lower chest: Bilateral small pleural effusions. No infiltrate lung bases. Central venous congestion. Hepatobiliary: Normal hepatic parenchyma. The gallbladder wall is mildly thickened enhancing this is minimal finding with the gallbladder wall measuring 2 mm. There is pericholecystic fluid in the gallbladder fossa (image 38/3. Several small gallstones are present. The gallbladder bladder is not distended. Common bile duct is normal caliber. Pancreas: Pancreas is normal. No ductal dilatation. No pancreatic inflammation. Spleen: Stomach, small bowel, appendix, and cecum are normal. The colon and rectosigmoid colon are normal. Adrenals/urinary tract: Adrenal glands and kidneys are normal. The ureters and bladder normal. Foley catheter in bladder. Stomach/Bowel: Stomach, small bowel, appendix, and cecum are normal. The colon and rectosigmoid colon are normal. Vascular/Lymphatic: Abdominal aorta is normal caliber with atherosclerotic calcification. There is no retroperitoneal or periportal lymphadenopathy. No pelvic lymphadenopathy. Reproductive: Prostate normal. Other: No free fluid. Musculoskeletal: No aggressive osseous lesion. IMPRESSION: 1. Cholelithiasis with gallbladder wall thickening and pericholecystic  fluid is concerning for ACUTE CHOLECYSTITIS. No significant gallbladder distension. 2. Common bile duct is normal.  No evidence of pancreatitis. These results will be called to the ordering clinician or representative by the Radiologist Assistant, and communication documented in the PACS or zVision Dashboard. Electronically Signed   By: Genevive Bi M.D.   On: 05/09/2019 13:36   US Abdomen Limited  Result Date: 05/08/2019 CLINICAL DATA:  Initial evaluation for acute fever, elevated LFTs and bilirubin. EXAM: ULTRASOUND ABDOMEN LIMITED RIGHT UPPER QUADRANT COMPARISON:  Prior CT from 01/19/2015 FINDINGS: Gallbladder: 9 mm echogenic focus at the gallbladder neck could reflect a small stone versus tumefactive sludge. Gallbladder wall measured at the upper limits of normal at 3 mm. No free pericholecystic fluid. No sonographic Murphy sign elicited on exam. Common bile duct: Diameter: 3.7 mm Liver: No focal lesion identified. Diffusely increased echogenicity within the hepatic parenchyma. Question of fine nodularity of the hepatic contour. Portal vein is patent on color Doppler imaging with normal direction of blood flow towards the liver. IMPRESSION: 1. 9 mm stone versus tumefactive sludge within the gallbladder neck. No sonographic features to suggest acute cholecystitis. 2. No biliary dilatation. 3. Increased echogenicity within the hepatic parenchyma with question of subtle nodularity of the hepatic  contour, which could reflect sequelae of cirrhosis and/or steatosis. Electronically Signed   By: Rise Mu M.D.   On: 05/08/2019 00:16   Dg Chest Port 1 View  Result Date: 05/09/2019 CLINICAL DATA:  Shortness of breath. EXAM: PORTABLE CHEST 1 VIEW COMPARISON:  05/08/2019 FINDINGS: Heart size remains stable. New diffuse interstitial and airspace opacities seen throughout both lungs, suspicious for diffuse pulmonary edema. No evidence of focal consolidation or definite pleural effusion. IMPRESSION: New  diffuse interstitial and airspace opacities, likely due to diffuse pulmonary edema. Electronically Signed   By: Myles Rosenthal M.D.   On: 05/09/2019 03:40   Dg Chest Port 1 View  Result Date: 05/07/2019 CLINICAL DATA:  Fever EXAM: PORTABLE CHEST 1 VIEW COMPARISON:  12/05/2018 FINDINGS: Mild cardiomegaly with pulmonary vascular congestion. No overt edema. No pneumothorax or sizable pleural effusion. No focal airspace consolidation. IMPRESSION: Mild cardiomegaly with pulmonary vascular congestion. Electronically Signed   By: Deatra Robinson M.D.   On: 05/07/2019 21:38   Dg Abd Acute 2+v W 1v Chest  Result Date: 05/08/2019 CLINICAL DATA:  Shortness of breath, abdominal distension EXAM: DG ABDOMEN ACUTE W/ 1V CHEST COMPARISON:  05/07/2019 FINDINGS: Lungs are clear.  No pleural effusion or pneumothorax. The heart is normal in size. Nonobstructive bowel gas pattern. No evidence of free air on the lateral decubitus view. Degenerative changes of the visualized thoracolumbar spine. IMPRESSION: No evidence of acute cardiopulmonary disease. No evidence of small bowel obstruction or free air. Electronically Signed   By: Charline Bills M.D.   On: 05/08/2019 11:18   Dg Abd Portable 1v  Result Date: 05/09/2019 CLINICAL DATA:  Abdominal distention. Shortness of breath. Congestive heart failure. EXAM: PORTABLE ABDOMEN - 1 VIEW COMPARISON:  None. FINDINGS: The bowel gas pattern is normal. No radio-opaque calculi or other significant radiographic abnormality are seen. IMPRESSION: Negative. Electronically Signed   By: Myles Rosenthal M.D.   On: 05/09/2019 03:40   [place] Ascites (abdomen Limited)  Result Date: 05/08/2019 CLINICAL DATA:  Abdominal distension. EXAM: LIMITED ABDOMEN ULTRASOUND FOR ASCITES TECHNIQUE: Limited ultrasound survey for ascites was performed in all four abdominal quadrants. COMPARISON:  None. FINDINGS: No ascites. IMPRESSION: No ascites. Electronically Signed   By: Gerome Sam III M.D   On: 05/08/2019  21:49    Review of Systems  Unable to perform ROS: Mental status change   Blood pressure (!) 159/103, pulse (!) 120, temperature 97.7 F (36.5 C), temperature source Oral, resp. rate (!) 36, height 5\' 6"  (1.676 m), weight 108.4 kg, SpO2 92 %. Physical Exam  Vitals reviewed. Constitutional: He appears well-developed and well-nourished. He appears lethargic.  HENT:  Head: Normocephalic and atraumatic.  Eyes: No scleral icterus.  Neck: Neck supple.  Cardiovascular: Regular rhythm and normal heart sounds. Tachycardia present.  Respiratory: Tachypnea noted.  GI: Soft. He exhibits no distension. There is no abdominal tenderness.  Neurological: He appears lethargic. He is disoriented.    Assessment/Plan: Likely gallstone pancreatitis -I think this is primary issue and bilirubin rising as well. No indication for any surgery at this point. I dont think he has cholecystitis and this picture is not indicative of that. -antibiotics, supportive care, would consider transferring to higher level of care as I am concerned about his current condition -I have paged Dr [name] Chimes about this Emelia Loron 05/09/2019, 3:40 PM

## 2019-05-09 NOTE — Progress Notes (Signed)
Pt is starting to look better and is resting.  Not as restless.  He looks more comfortable at this time.  Will continue to monitor.

## 2019-05-09 NOTE — Progress Notes (Signed)
PROGRESS NOTE    Johnathan Keith  UEA:[id number] DOB: 12-01-1961 DOA: 05/07/2019 PCP: Shelle Iron, MD   Brief Narrative:  58 year old with past medical history of diabetes mellitus type 2, hyperlipidemia, coronary artery disease, essential hypertension, CVA was brought to the hospital for concerns of fever, weakness and confusion at home.  Upon admission he was found to be febrile with significantly elevated LFTs, lipase level and total bilirubin.  His lactate level were quite elevated therefore was given aggressive IV fluids.  With aggressive IV fluids patient became short of breath therefore had to be stopped and started on Lasix.  CT of the head was degraded but no gross abnormality was noted.  Due to sepsis he was on broad-spectrum antibiotics.   Assessment & Plan:   Principal Problem:   Sepsis (HCC) Active Problems:   Type 2 diabetes mellitus with vascular disease (HCC)   Hyperlipidemia   Essential hypertension   CAD -S/P PCI-2016   History of stroke   Fever   Transaminitis   Serum total bilirubin elevated   Acute pancreatitis  Sepsis secondary to intra-abdominal infectionAnd urinary tract infection, present on admission Acute gallstone pancreatitis with transaminitis Abdominal distention - Keep the patient n.p.o., stop IV fluids.  Continue broad-spectrum antibiotics, WBC trending down.  Once susceptibilities available will tailor it depending on how he is doing. -Limited ultrasound ordered to evaluate for ascites bladder.  GI ordered CT of the abdomen pelvis with contrast. -Continue IV antibiotics vancomycin and cefepime - Chest x-ray and abdominal x-ray-negative for acute pathology. -Ammonia and TSH within normal limits -UA is negative -CT head is negative for any intracranial pathology, poor study - Will need general surgery evaluation once medically stable.  Acute metabolic encephalopathy -Secondary to underlying infection.  Getting aggressive antibiotics.  CT of the  head is negative.  Acute respiratory distress with hypoxia - Chest x-ray suggestive of fluid overload.  Stop IV fluids, Lasix 80 mg IV every 12 hours.  Urinary retention acute on chronic - Has been off and on chronic issue per patient's wife.  Continue bladder scans and intermittent in and out caths as necessary.  Foley catheter if necessary.  Follows with outpatient urology  History of coronary artery disease status post PCI -Continue aspirin.  Plavix and statin on hold.  Plavix on hold in anticipation for intra-abdominal procedure possibly ERCP and statin on hold due to transaminitis.  Diabetes mellitus type 2, uncontrolled - Insulin sliding scale and Accu-Chek.  Increase Levemir to 20 units daily.  Once medically stable he will also need PT/OT  DVT prophylaxis: SCDs Code Status: Full Family Communication:  Spoke with the patients Wife, Darl Pikes, over the phone Disposition Plan: Maintain stepdown unit/progressive care stay.  Consultants:   Gastroenterology   Procedures:   None so Far  Antimicrobials:   Vanc 5/30  Cefepime 5/30   Subjective: Patient is only alert to his name but otherwise mostly does not carry on any meaningful conversation.  Yesterday evening and late at night he was having episode of shortness of breath and his abdomen was also distended.  He appeared restless.  He was started on diuretics, antibiotics were broadened.  Review of Systems Otherwise negative except as per HPI, including: Difficult to obtain full review of system due to his mentation  Objective: Vitals:   05/09/19 0558 05/09/19 0616 05/09/19 0826 05/09/19 0928  BP:    (!) 168/86  Pulse:      Resp:      Temp:  99.1 F (37.3 C)  98.3 F (36.8 C) 98.4 F (36.9 C)  TempSrc:  Axillary Oral Oral  SpO2:      Weight: 108.4 kg     Height:        Intake/Output Summary (Last 24 hours) at 05/09/2019 1123 Last data filed at 05/09/2019 1035 Gross per 24 hour  Intake 1400 ml  Output 1801 ml   Net -401 ml   Filed Weights   05/07/19 2037 05/08/19 0059 05/09/19 0558  Weight: 113.4 kg 115.6 kg 108.4 kg    Examination:  Constitutional: Slight distress due to shortness of breath Neck: normal, supple, no masses, no thyromegaly Respiratory: Diffuse diminished breath sounds Cardiovascular: Regular rate and rhythm, no murmurs / rubs / gallops. No extremity edema. 2+ pedal pulses. No carotid bruits.  Abdomen: no tenderness, no masses palpated. No hepatosplenomegaly. Bowel sounds positive.  Abdomen appears to be distended Musculoskeletal: No gross deformities Skin: no rashes, lesions, ulcers. No induration Neurologic: Grossly moving all the extremities. Psychiatric: Unable to assess.  Only alert to his name.   Data Reviewed:   CBC: Recent Labs  Lab 05/07/19 2052 05/07/19 2104 05/08/19 0513 05/09/19 0408  WBC 14.2*  --  17.8* 8.9  NEUTROABS 12.9*  --   --   --   HGB 14.1 13.6 12.5* 13.0  HCT 44.2 40.0 38.6* 40.7  MCV 91.5  --  89.4 91.1  PLT 204  --  204 155   Basic Metabolic Panel: Recent Labs  Lab 05/07/19 2052 05/07/19 2104 05/08/19 0513 05/09/19 0408  NA 139 140 141 147*  K 3.8 3.8 3.8 3.5  CL 103  --  104 110  CO2 24  --  21* 23  GLUCOSE 373*  --  395* 353*  BUN 30*  --  33* 18  CREATININE 1.29*  --  1.51* 1.33*  CALCIUM 9.0  --  8.7* 8.2*  MG  --   --   --  1.7   GFR: Estimated Creatinine Clearance: 69 mL/min (A) (by C-G formula based on SCr of 1.33 mg/dL (H)). Liver Function Tests: Recent Labs  Lab 05/07/19 2052 05/08/19 0513 05/09/19 0408  AST 309* 532* 334*  ALT 194* 414* 460*  ALKPHOS 86 75 87  BILITOT 2.3* 3.5* 5.1*  PROT 6.3* 6.2* 6.6  ALBUMIN 3.3* 3.0* 2.8*   Recent Labs  Lab 05/07/19 0000  LIPASE 1,276*   Recent Labs  Lab 05/08/19 1125  AMMONIA 18   Coagulation Profile: No results for input(s): INR, PROTIME in the last 168 hours. Cardiac Enzymes: No results for input(s): CKTOTAL, CKMB, CKMBINDEX, TROPONINI in the last 168  hours. BNP (last 3 results) No results for input(s): PROBNP in the last 8760 hours. HbA1C: No results for input(s): HGBA1C in the last 72 hours. CBG: Recent Labs  Lab 05/08/19 1111 05/08/19 1750 05/08/19 2327 05/09/19 0621 05/09/19 0823  GLUCAP 370* 290* 258* 359* 396*   Lipid Profile: No results for input(s): CHOL, HDL, LDLCALC, TRIG, CHOLHDL, LDLDIRECT in the last 72 hours. Thyroid Function Tests: Recent Labs    05/08/19 1125  TSH 1.714   Anemia Panel: No results for input(s): VITAMINB12, FOLATE, FERRITIN, TIBC, IRON, RETICCTPCT in the last 72 hours. Sepsis Labs: Recent Labs  Lab 05/08/19 0513 05/08/19 1125 05/08/19 1435 05/09/19 0415  PROCALCITON  --  22.96  --   --   LATICACIDVEN 4.1* 4.2* 3.1* 1.9    Recent Results (from the past 240 hour(s))  Blood Culture (routine x 2)     Status: Abnormal (  Preliminary result)   Collection Time: 05/07/19  9:11 PM  Result Value Ref Range Status   Specimen Description BLOOD LEFT HAND  Final   Special Requests   Final    BOTTLES DRAWN AEROBIC AND ANAEROBIC Blood Culture adequate volume   Culture  Setup Time   Final    GRAM NEGATIVE RODS IN BOTH AEROBIC AND ANAEROBIC BOTTLES CRITICAL RESULT CALLED TO, READ BACK BY AND VERIFIED WITH: PHARMD ADREW Judie Petit [phone] FCP  Performed at [facility], [address], [place], [place] [address]    Culture KLEBSIELLA PNEUMONIAE (A)  Final   Report Status PENDING  Incomplete  Blood Culture ID Panel (Reflexed)     Status: Abnormal   Collection Time: 05/07/19  9:11 PM  Result Value Ref Range Status   Enterococcus species NOT DETECTED NOT DETECTED Final   Listeria monocytogenes NOT DETECTED NOT DETECTED Final   Staphylococcus species NOT DETECTED NOT DETECTED Final   Staphylococcus aureus (BCID) NOT DETECTED NOT DETECTED Final   Streptococcus species NOT DETECTED NOT DETECTED Final   Streptococcus agalactiae NOT DETECTED NOT DETECTED Final   Streptococcus pneumoniae NOT DETECTED  NOT DETECTED Final   Streptococcus pyogenes NOT DETECTED NOT DETECTED Final   Acinetobacter baumannii NOT DETECTED NOT DETECTED Final   Enterobacteriaceae species DETECTED (A) NOT DETECTED Final    Comment: Enterobacteriaceae represent a large family of gram-negative bacteria, not a single organism. CRITICAL RESULT CALLED TO, READ BACK BY AND VERIFIED WITH: PHARMD ADREW M. [phone] FCP     Enterobacter cloacae complex NOT DETECTED NOT DETECTED Final   Escherichia coli NOT DETECTED NOT DETECTED Final   Klebsiella oxytoca NOT DETECTED NOT DETECTED Final   Klebsiella pneumoniae DETECTED (A) NOT DETECTED Final    Comment: CRITICAL RESULT CALLED TO, READ BACK BY AND VERIFIED WITH: PHARMD ADREW M. [phone] FCP     Proteus species NOT DETECTED NOT DETECTED Final   Serratia marcescens NOT DETECTED NOT DETECTED Final   Carbapenem resistance NOT DETECTED NOT DETECTED Final   Haemophilus influenzae NOT DETECTED NOT DETECTED Final   Neisseria meningitidis NOT DETECTED NOT DETECTED Final   Pseudomonas aeruginosa NOT DETECTED NOT DETECTED Final   Candida albicans NOT DETECTED NOT DETECTED Final   Candida glabrata NOT DETECTED NOT DETECTED Final   Candida krusei NOT DETECTED NOT DETECTED Final   Candida parapsilosis NOT DETECTED NOT DETECTED Final   Candida tropicalis NOT DETECTED NOT DETECTED Final    Comment: Performed at [facility], [address] [place], [place] [address]  SARS Coronavirus 2 (CEPHEID- Performed in [facility] hospital lab), Hosp Order     Status: None   Collection Time: 05/07/19  9:26 PM  Result Value Ref Range Status   SARS Coronavirus 2 NEGATIVE NEGATIVE Final    Comment: (NOTE) If result is NEGATIVE SARS-CoV-2 target nucleic acids are NOT DETECTED. The SARS-CoV-2 RNA is generally detectable in upper and lower  respiratory specimens during the acute phase of infection. The lowest  concentration of SARS-CoV-2 viral copies this assay can detect is 250   copies / mL. A negative result does not preclude SARS-CoV-2 infection  and should not be used as the sole basis for treatment or other  patient management decisions.  A negative result may occur with  improper specimen collection / handling, submission of specimen other  than nasopharyngeal swab, presence of viral mutation(s) within the  areas targeted by this assay, and inadequate number of viral copies  (<250  copies / mL). A negative result must be combined with clinical  observations, patient history, and epidemiological information. If result is POSITIVE SARS-CoV-2 target nucleic acids are DETECTED. The SARS-CoV-2 RNA is generally detectable in upper and lower  respiratory specimens dur ing the acute phase of infection.  Positive  results are indicative of active infection with SARS-CoV-2.  Clinical  correlation with patient history and other diagnostic information is  necessary to determine patient infection status.  Positive results do  not rule out bacterial infection or co-infection with other viruses. If result is PRESUMPTIVE POSTIVE SARS-CoV-2 nucleic acids MAY BE PRESENT.   A presumptive positive result was obtained on the submitted specimen  and confirmed on repeat testing.  While 2019 novel coronavirus  (SARS-CoV-2) nucleic acids may be present in the submitted sample  additional confirmatory testing may be necessary for epidemiological  and / or clinical management purposes  to differentiate between  SARS-CoV-2 and other Sarbecovirus currently known to infect humans.  If clinically indicated additional testing with an alternate test  methodology [phone]) is advised. The SARS-CoV-2 RNA is generally  detectable in upper and lower respiratory sp ecimens during the acute  phase of infection. The expected result is Negative. Fact Sheet for Patients:  BoilerBrush.com.cy Fact Sheet for Healthcare Providers: https://pope.com/  This test is not yet approved or cleared by the [place] FDA and has been authorized for detection and/or diagnosis of SARS-CoV-2 by FDA under an Emergency Use Authorization (EUA).  This EUA will remain in effect (meaning this test can be used) for the duration of the COVID-19 declaration under Section 564(b)(1) of the Act, 21 U.S.C. section 360bbb-3(b)(1), unless the authorization is terminated or revoked sooner. Performed at [facility], [address] [place], [place] [address]   Blood Culture (routine x 2)     Status: None (Preliminary result)   Collection Time: 05/07/19  9:44 PM  Result Value Ref Range Status   Specimen Description BLOOD BLOOD LEFT FOREARM  Final   Special Requests   Final    BOTTLES DRAWN AEROBIC AND ANAEROBIC Blood Culture adequate volume   Culture  Setup Time   Final    GRAM NEGATIVE RODS IN BOTH AEROBIC AND ANAEROBIC BOTTLES CRITICAL RESULT CALLED TO, READ BACK BY AND VERIFIED WITH: T GREEN,PHARMD AT 1738 05/08/2019 BY L BENFIELD Performed at [facility], [address], [place], [place] [address]    Culture GRAM NEGATIVE RODS  Final   Report Status PENDING  Incomplete  Urine culture     Status: None   Collection Time: 05/07/19 11:10 PM  Result Value Ref Range Status   Specimen Description URINE, RANDOM  Final   Special Requests NONE  Final   Culture   Final    NO GROWTH Performed at [facility], [address] [place], [place] [address]    Report Status 05/09/2019 FINAL  Final  Respiratory Panel by PCR     Status: None   Collection Time: 05/08/19  4:57 PM  Result Value Ref Range Status   Adenovirus NOT DETECTED NOT DETECTED Final   Coronavirus 229E NOT DETECTED NOT DETECTED Final    Comment: (NOTE) The Coronavirus on the Respiratory Panel, DOES NOT test for the novel  Coronavirus (2019 nCoV)    Coronavirus HKU1 NOT DETECTED NOT DETECTED Final   Coronavirus NL63 NOT DETECTED NOT DETECTED Final   Coronavirus OC43 NOT  DETECTED NOT DETECTED Final   Metapneumovirus NOT DETECTED NOT DETECTED Final  Rhinovirus / Enterovirus NOT DETECTED NOT DETECTED Final   Influenza A NOT DETECTED NOT DETECTED Final   Influenza B NOT DETECTED NOT DETECTED Final   Parainfluenza Virus 1 NOT DETECTED NOT DETECTED Final   Parainfluenza Virus 2 NOT DETECTED NOT DETECTED Final   Parainfluenza Virus 3 NOT DETECTED NOT DETECTED Final   Parainfluenza Virus 4 NOT DETECTED NOT DETECTED Final   Respiratory Syncytial Virus NOT DETECTED NOT DETECTED Final   Bordetella pertussis NOT DETECTED NOT DETECTED Final   Chlamydophila pneumoniae NOT DETECTED NOT DETECTED Final   Mycoplasma pneumoniae NOT DETECTED NOT DETECTED Final    Comment: Performed at [facility], [address] [place], [place] [address]         Radiology Studies: Ct Head Wo Contrast  Result Date: 05/08/2019 CLINICAL DATA:  Altered mental status.  Combative. EXAM: CT HEAD WITHOUT CONTRAST TECHNIQUE: Contiguous axial images were obtained from the base of the skull through the vertex without intravenous contrast. COMPARISON:  None. FINDINGS: Brain: Motion degradation of the exam. Exam was repeated with minimal improvement. No gross evidence intracranial hemorrhage. No midline shift or mass effect. Difficult to exclude cortical infarction with the degree of motion. Basal cisterns are patent. Vascular: No hyperdense vessel or unexpected calcification. Skull: Normal. Negative for fracture or focal lesion. Sinuses/Orbits: Paranasal sinuses and mastoid air cells are clear. Orbits are clear. Other: None. IMPRESSION: Exam is extremely degraded by head motion. Exam was repeated with little improvement. No gross abnormality. Electronically Signed   By: Genevive Bi M.D.   On: 05/08/2019 19:34   US Abdomen Limited  Result Date: 05/08/2019 CLINICAL DATA:  Initial evaluation for acute fever, elevated LFTs and bilirubin. EXAM: ULTRASOUND ABDOMEN LIMITED RIGHT UPPER QUADRANT  COMPARISON:  Prior CT from 01/19/2015 FINDINGS: Gallbladder: 9 mm echogenic focus at the gallbladder neck could reflect a small stone versus tumefactive sludge. Gallbladder wall measured at the upper limits of normal at 3 mm. No free pericholecystic fluid. No sonographic Murphy sign elicited on exam. Common bile duct: Diameter: 3.7 mm Liver: No focal lesion identified. Diffusely increased echogenicity within the hepatic parenchyma. Question of fine nodularity of the hepatic contour. Portal vein is patent on color Doppler imaging with normal direction of blood flow towards the liver. IMPRESSION: 1. 9 mm stone versus tumefactive sludge within the gallbladder neck. No sonographic features to suggest acute cholecystitis. 2. No biliary dilatation. 3. Increased echogenicity within the hepatic parenchyma with question of subtle nodularity of the hepatic contour, which could reflect sequelae of cirrhosis and/or steatosis. Electronically Signed   By: Rise Mu M.D.   On: 05/08/2019 00:16   Dg Chest Port 1 View  Result Date: 05/09/2019 CLINICAL DATA:  Shortness of breath. EXAM: PORTABLE CHEST 1 VIEW COMPARISON:  05/08/2019 FINDINGS: Heart size remains stable. New diffuse interstitial and airspace opacities seen throughout both lungs, suspicious for diffuse pulmonary edema. No evidence of focal consolidation or definite pleural effusion. IMPRESSION: New diffuse interstitial and airspace opacities, likely due to diffuse pulmonary edema. Electronically Signed   By: Myles Rosenthal M.D.   On: 05/09/2019 03:40   Dg Chest Port 1 View  Result Date: 05/07/2019 CLINICAL DATA:  Fever EXAM: PORTABLE CHEST 1 VIEW COMPARISON:  12/05/2018 FINDINGS: Mild cardiomegaly with pulmonary vascular congestion. No overt edema. No pneumothorax or sizable pleural effusion. No focal airspace consolidation. IMPRESSION: Mild cardiomegaly with pulmonary vascular congestion. Electronically Signed   By: Deatra Robinson M.D.   On: 05/07/2019  21:38   Dg Abd  Acute 2+v W 1v Chest  Result Date: 05/08/2019 CLINICAL DATA:  Shortness of breath, abdominal distension EXAM: DG ABDOMEN ACUTE W/ 1V CHEST COMPARISON:  05/07/2019 FINDINGS: Lungs are clear.  No pleural effusion or pneumothorax. The heart is normal in size. Nonobstructive bowel gas pattern. No evidence of free air on the lateral decubitus view. Degenerative changes of the visualized thoracolumbar spine. IMPRESSION: No evidence of acute cardiopulmonary disease. No evidence of small bowel obstruction or free air. Electronically Signed   By: Charline BillsSriyesh  Krishnan M.D.   On: 05/08/2019 11:18   Dg Abd Portable 1v  Result Date: 05/09/2019 CLINICAL DATA:  Abdominal distention. Shortness of breath. Congestive heart failure. EXAM: PORTABLE ABDOMEN - 1 VIEW COMPARISON:  None. FINDINGS: The bowel gas pattern is normal. No radio-opaque calculi or other significant radiographic abnormality are seen. IMPRESSION: Negative. Electronically Signed   By: Myles RosenthalJohn  Stahl M.D.   On: 05/09/2019 03:40   [place] Ascites (abdomen Limited)  Result Date: 05/08/2019 CLINICAL DATA:  Abdominal distension. EXAM: LIMITED ABDOMEN ULTRASOUND FOR ASCITES TECHNIQUE: Limited ultrasound survey for ascites was performed in all four abdominal quadrants. COMPARISON:  None. FINDINGS: No ascites. IMPRESSION: No ascites. Electronically Signed   By: Gerome Samavid  Schurman III M.D   On: 05/08/2019 21:49        Scheduled Meds: . aspirin EC  81 mg Oral Daily  . bisacodyl  10 mg Rectal Q6H  . furosemide  80 mg Intravenous Q12H  . ibuprofen  400 mg Oral Once  . insulin aspart  0-15 Units Subcutaneous TID WC  . insulin aspart  0-5 Units Subcutaneous QHS  . insulin detemir  20 Units Subcutaneous Daily  . senna-docusate  2 tablet Oral BID   Continuous Infusions: . ceFEPime (MAXIPIME) IV 2 g (05/09/19 [id number])  . metronidazole 500 mg (05/09/19 0847)  . vancomycin 1,750 mg (05/09/19 1049)     LOS: 1 day   Time spent= 45 mins    Dyann Goodspeed  Joline Maxcyhirag Voula Waln, MD [facility]  If 7PM-7AM, please contact night-coverage [email or web address] 05/09/2019, 11:23 AM

## 2019-05-09 NOTE — Significant Event (Signed)
Rapid Response Event Note    Called just after shift change to assess pt with increased SOB and confusion. Pt confused and not following command. Vitals stable but would desat with oxygen removal. Currently on 4L [place]. Pt status appears to be consistent with prior shifts assessment (Md's aware). RN instructed to call with any changes or concerns.   Called to check back on pt status and informed that pt does not look any better. RN instructed to call primary team to lay eyes on pt. Orders received for CXR, ABD xray, abg (7.38/43/59/24), lasix, foley. Continues to be confused and not following commands. Will continue to monitor.          Johnathan Keith

## 2019-05-09 NOTE — Progress Notes (Signed)
Placed Foley Catheter.

## 2019-05-09 NOTE — Progress Notes (Signed)
Put patient on [place] at 11 liters.

## 2019-05-09 NOTE — Progress Notes (Addendum)
[facility] Progress Note  CC:  Elevated LFTs, pancreatitis   Subjective: Patient is confused, restless. He is able to state his name. Does not answer other questions. RN at bedside.   Objective:  Vital signs in last 24 hours: Temp:  [97.2 F (36.2 C)-102.7 F (39.3 C)] 98.3 F (36.8 C) (05/31 0826) Pulse Rate:  [105-120] 120 (05/31 0100) Resp:  [16-36] 36 (05/31 0100) BP: (145-170)/(77-87) 145/78 (05/30 1936) SpO2:  [92 %-94 %] 92 % (05/31 0100) Weight:  [108.4 kg] 108.4 kg (05/31 0558) Last BM Date: 05/08/19 General:   Alert,  Well-developed,    in NAD Heart: tachycardic, no murmur Pulm: respirations labored, clear, decreased in the bases bilaterally Abdomen: protuberant, moderate upper abdominal tenderness without rebound or guarding, hypoactive BS x 4 quads.  Extremities: trace edema to bilateral LEs. Neurologic:  Alert to name, restless, does not follow commands.  Intake/Output from previous day: 05/30 0701 - 05/31 0700 In: 1969.6 [I.V.:569.6; IV Piggyback:1400] Out: 900 [Urine:900] Intake/Output this shift: Total I/O In: -  Out: 900 [Urine:900]  Lab Results: Recent Labs    05/07/19 2052 05/07/19 2104 05/08/19 0513 05/09/19 0408  WBC 14.2*  --  17.8* 8.9  HGB 14.1 13.6 12.5* 13.0  HCT 44.2 40.0 38.6* 40.7  PLT 204  --  204 155   BMET Recent Labs    05/07/19 2052 05/07/19 2104 05/08/19 0513 05/09/19 0408  NA 139 140 141 147*  K 3.8 3.8 3.8 3.5  CL 103  --  104 110  CO2 24  --  21* 23  GLUCOSE 373*  --  395* 353*  BUN 30*  --  33* 18  CREATININE 1.29*  --  1.51* 1.33*  CALCIUM 9.0  --  8.7* 8.2*   LFT Recent Labs    05/09/19 0408  PROT 6.6  ALBUMIN 2.8*  AST 334*  ALT 460*  ALKPHOS 87  BILITOT 5.1*   PT/INR No results for input(s): LABPROT, INR in the last 72 hours. Hepatitis Panel No results for input(s): HEPBSAG, HCVAB, HEPAIGM, HEPBIGM in the last 72 hours.  Ct Head Wo Contrast  Result Date: 05/08/2019  CLINICAL DATA:  Altered mental status.  Combative. EXAM: CT HEAD WITHOUT CONTRAST TECHNIQUE: Contiguous axial images were obtained from the base of the skull through the vertex without intravenous contrast. COMPARISON:  None. FINDINGS: Brain: Motion degradation of the exam. Exam was repeated with minimal improvement. No gross evidence intracranial hemorrhage. No midline shift or mass effect. Difficult to exclude cortical infarction with the degree of motion. Basal cisterns are patent. Vascular: No hyperdense vessel or unexpected calcification. Skull: Normal. Negative for fracture or focal lesion. Sinuses/Orbits: Paranasal sinuses and mastoid air cells are clear. Orbits are clear. Other: None. IMPRESSION: Exam is extremely degraded by head motion. Exam was repeated with little improvement. No gross abnormality. Electronically Signed   By: Suzy Bouchard M.D.   On: 05/08/2019 19:34   US Abdomen Limited  Result Date: 05/08/2019 CLINICAL DATA:  Initial evaluation for acute fever, elevated LFTs and bilirubin. EXAM: ULTRASOUND ABDOMEN LIMITED RIGHT UPPER QUADRANT COMPARISON:  Prior CT from 01/19/2015 FINDINGS: Gallbladder: 9 mm echogenic focus at the gallbladder neck could reflect a small stone versus tumefactive sludge. Gallbladder wall measured at the upper limits of normal at 3 mm. No free pericholecystic fluid. No sonographic Murphy sign elicited on exam. Common bile duct: Diameter: 3.7 mm Liver: No focal lesion identified. Diffusely increased echogenicity within the hepatic parenchyma. Question of fine nodularity  of the hepatic contour. Portal vein is patent on color Doppler imaging with normal direction of blood flow towards the liver. IMPRESSION: 1. 9 mm stone versus tumefactive sludge within the gallbladder neck. No sonographic features to suggest acute cholecystitis. 2. No biliary dilatation. 3. Increased echogenicity within the hepatic parenchyma with question of subtle nodularity of the hepatic contour,  which could reflect sequelae of cirrhosis and/or steatosis. Electronically Signed   By: Jeannine Boga M.D.   On: 05/08/2019 00:16   Dg Chest Port 1 View  Result Date: 05/09/2019 CLINICAL DATA:  Shortness of breath. EXAM: PORTABLE CHEST 1 VIEW COMPARISON:  05/08/2019 FINDINGS: Heart size remains stable. New diffuse interstitial and airspace opacities seen throughout both lungs, suspicious for diffuse pulmonary edema. No evidence of focal consolidation or definite pleural effusion. IMPRESSION: New diffuse interstitial and airspace opacities, likely due to diffuse pulmonary edema. Electronically Signed   By: Earle Gell M.D.   On: 05/09/2019 03:40   Dg Chest Port 1 View  Result Date: 05/07/2019 CLINICAL DATA:  Fever EXAM: PORTABLE CHEST 1 VIEW COMPARISON:  12/05/2018 FINDINGS: Mild cardiomegaly with pulmonary vascular congestion. No overt edema. No pneumothorax or sizable pleural effusion. No focal airspace consolidation. IMPRESSION: Mild cardiomegaly with pulmonary vascular congestion. Electronically Signed   By: Ulyses Jarred M.D.   On: 05/07/2019 21:38   Dg Abd Acute 2+v W 1v Chest  Result Date: 05/08/2019 CLINICAL DATA:  Shortness of breath, abdominal distension EXAM: DG ABDOMEN ACUTE W/ 1V CHEST COMPARISON:  05/07/2019 FINDINGS: Lungs are clear.  No pleural effusion or pneumothorax. The heart is normal in size. Nonobstructive bowel gas pattern. No evidence of free air on the lateral decubitus view. Degenerative changes of the visualized thoracolumbar spine. IMPRESSION: No evidence of acute cardiopulmonary disease. No evidence of small bowel obstruction or free air. Electronically Signed   By: Julian Hy M.D.   On: 05/08/2019 11:18   Dg Abd Portable 1v  Result Date: 05/09/2019 CLINICAL DATA:  Abdominal distention. Shortness of breath. Congestive heart failure. EXAM: PORTABLE ABDOMEN - 1 VIEW COMPARISON:  None. FINDINGS: The bowel gas pattern is normal. No radio-opaque calculi or other  significant radiographic abnormality are seen. IMPRESSION: Negative. Electronically Signed   By: Earle Gell M.D.   On: 05/09/2019 03:40   [place] Ascites (abdomen Limited)  Result Date: 05/08/2019 CLINICAL DATA:  Abdominal distension. EXAM: LIMITED ABDOMEN ULTRASOUND FOR ASCITES TECHNIQUE: Limited ultrasound survey for ascites was performed in all four abdominal quadrants. COMPARISON:  None. FINDINGS: No ascites. IMPRESSION: No ascites. Electronically Signed   By: Dorise Bullion III M.D   On: 05/08/2019 21:49    Assessment / Plan: 70. 58 y.o. male with AMS, generalized weakness, elevated LFTs and most likely gallstone pancreatitis, cholecystitis. Abdominal sonogram shows a 1.9 cm stone within the neck of the gallbladder, no biliary dilatation, CBD diameter 3.7 cm,  ? Cirrhosis vs steatosis. Alk Phos 87. Albumin 2.8. AST 334 down from 532. ALT 460 up from 414. T. Bili 5.1 up from 3.5.  -stat abd/pelvic CT with IV contrast only -NPO -IVF per hospitalist -continue Cefepimine and Vancomycin IV  2. AKI. Patient diuresed, required in/out cath then foley catheter this am. Creatinine 1.33 down from 1.51.  3. Acute respiratory distress with hypoxia, fluid overload.  CXR shows diffuse pulmonary edema. Respiratory status concerning, tachypneic, tachycardic. Received Lasix 41m IV, Metoprolol 535mIV, Tylenol. O2 5L.  4. Leukocytosis secondary to pancreatitis. WBC 17.8. Blood cultures pending. Temp 102.6. Received Tylenol. On Cefepimine and Vancomycin  IV.  4. Hx of CAD on Plavix and ASA, ? Last dose of Plavix   Further recommendations per Dr. Henrene Pastor    LOS: 1 day   Patrecia Pour [facility]  05/09/2019, 9:12 AM  GI ATTENDING  Interval history data reviewed.  Patient seen and examined.  Agree with comprehensive consultation note as outlined above.  Patient remains ill.  Febrile with leukocytosis.  May all be secondary to biliary pancreatitis but need to rule out concurrent cholecystitis.  In addition to  maximal supportive measures agree with urgent contrast-enhanced CT scan of the abdomen.  I would also recommend a surgical opinion.  We will follow.  Docia Chuck. Geri Seminole., M.D. [facility] Division of Gastroenterology

## 2019-05-09 NOTE — Progress Notes (Signed)
Pharmacy Antibiotic Note  Johnathan Keith is a 58 y.o. male admitted on 05/07/2019 with sepsis.  Pharmacy consulted 05/07/19 for Vancomycin and Cefepime dosing. Sepsis 2/2 intra-abd infecton and UTI, acute gallstone pancreatitis with transaminitis.  CXR and abd Xray negative for acute pathology WBC 14.2>17.8>8.9, LA 3.1>4.1> 1.9,  PCT 22.96,  Afebrile, Tm 102.7 SCr 1.29>1.51>1.33 est CrCl ~69 ml/min, weight 108.4 kg, BMI 38 Vanc 1750 mg IV q12h (last given 10am 5/31.  Will decrease dose for AUC goal 400-550.    Vancomycin 1250 mg IV Q 24 hrs. Goal AUC 400-550. Expected AUC: 4685 SCr used: 1.33,  IBW, and Vd 0.5 L/kg   Plan: Cefepime 2 gms IV q8hr, continue, watch renal function Decrease Vancomycin to 1250 mg IV Q 24 hrs, give next dose on 6/1 at 16:00. Goal AUC 400-550. Expected AUC: 468 SCr used: 1.33,  IBW, and Vd 0.5 L/kg Monitor renal function, clinical status, C&S and vanc levels as needed  Height: 5\' 6"  (167.6 cm) Weight: 238 lb 15.7 oz (108.4 kg) IBW/kg (Calculated) : 63.8  Temp (24hrs), Avg:99.9 F (37.7 C), Min:97.2 F (36.2 C), Max:102.6 F (39.2 C)  Recent Labs  Lab 05/07/19 2052  05/08/19 0205 05/08/19 0513 05/08/19 1125 05/08/19 1435 05/09/19 0408 05/09/19 0415  WBC 14.2*  --   --  17.8*  --   --  8.9  --   CREATININE 1.29*  --   --  1.51*  --   --  1.33*  --   LATICACIDVEN 3.9*   < > 3.7* 4.1* 4.2* 3.1*  --  1.9   < > = values in this interval not displayed.    Estimated Creatinine Clearance: 69 mL/min (A) (by C-G formula based on SCr of 1.33 mg/dL (H)).    No Known Allergies  Antimicrobials this admission: Vanc 5/29 >>  Cefepime 5/29 >>  Flagyl IV 5/30>>  Dose Adjustments:  5/31 Vanc 1750mg  IV q12h - recalculcated AUC , (used IBW for ke and Vd 0.5 L /kg d/t BMI >30)  >>decreased dose to 1250mg  IV q24   Expected AUC 468 Used Scr 1.33 (IBW for ke , Vd 0.5 L/kg d/t BMI >30)   Microbiology: 5/29 BCx: GNR pending  5/29 BCx: klebsiella pneu , sens  pending 5/29 BCID 5/30: klebsiella pneum 5/29 Covid: negative    Thank you for allowing pharmacy to be a part of this patient's care.  Noah Delaine, RPh Clinical Pharmacist [phone] Please see AMION for all Pharmacists' Contact Phone Numbers 05/09/2019, 2:52 PM

## 2019-05-09 NOTE — Consult Note (Addendum)
NAME:  Johnathan Keith, MRN:  [id number], DOB:  01-29-1961, LOS: 1 ADMISSION DATE:  05/07/2019, CONSULTATION DATE:  05/09/2019 REFERRING MD:  Dr [name] Chimes, CHIEF COMPLAINT:  Acute encephalopathy, SIRs, gallstone pancreatitis   Brief History   See belo  History of present illness   57 year old obese male with acid reflux, hypertension, coronary artery disease and diabetes.  He arrived in the emergency department May 07, 2019 because of fever, chills, generalized weakness and confusion associated with nausea and vomiting.  Initially fever was 100.3 and tachycardic.  His initial lactic acid was 3.9.  His COVID-19 test was negative.  According to the bedside nurse for most of the day on May 08, 2019 he had hypoactive delirium and significant sirs physiology.  However today they feel he is somewhat improved with improved AKI and resolved lactic acidosis and improving LFT and HR and RR.  However, because of his agitated deliriumt  has been difficult to control and MEWS score is 6. Therefore critical care medicine has been consulted.  Evaluation is shown that GI and [facility] [place] [facility] have consulted on him and the unifying diagnosis is 1 of gallstone pancreatitis [admission diagnosis based on chemical lipase elevation of 1276] without evidence of acute cholecystitis according to consultation by the 2 clinicians.  [his CT scan did not show any pancreatitis but suggested he had cholecystitis].  He has a small stone in the gallbladder neck without any biliary dilatation.  Aggressive ICU medical management has been recommended.  Growing klebisella in blood  Past Medical History     has a past medical history of Chronic neck and back pain, Coronary artery disease, Deviated septum, Diabetic retinopathy (HCC), GERD (gastroesophageal reflux disease), Headache, Heart attack (HCC), High cholesterol, History of gout, Hypertension, Kidney stones, Migraine, Peripheral vascular disease (HCC), PONV (postoperative  nausea and vomiting), Sleep apnea, Stroke (HCC) (2014), and Type II diabetes mellitus (HCC).   reports that he has never smoked. He has never used smokeless tobacco.  Past Surgical History:  Procedure Laterality Date  . CIRCUMCISION  1981  . CORONARY ANGIOPLASTY WITH STENT PLACEMENT  03/23/2015   "2"  . CORONARY STENT INTERVENTION N/A 09/29/2017   Procedure: CORONARY STENT INTERVENTION;  Surgeon: Runell Gess, MD;  Location: [facility] INVASIVE CV LAB;  Service: Cardiovascular;  Laterality: N/A;  . ENDARTERECTOMY Right 09/26/2014   Procedure: RIGHT CAROTID ENDARTERECTOMY WITH PATCH ANGIOPLASTY;  Surgeon: Fransisco Hertz, MD;  Location: [facility];  Service: Vascular;  Laterality: Right;  . EYE SURGERY Right May 2016   Cataract  . EYE SURGERY Left July 2016   Cataract  . HYDROCELE EXCISION  ~ 2008  . LEFT HEART CATH AND CORONARY ANGIOGRAPHY N/A 09/29/2017   Procedure: LEFT HEART CATH AND CORONARY ANGIOGRAPHY;  Surgeon: Runell Gess, MD;  Location: [facility] INVASIVE CV LAB;  Service: Cardiovascular;  Laterality: N/A;  . LEFT HEART CATHETERIZATION WITH CORONARY ANGIOGRAM N/A 03/23/2015   Procedure: LEFT HEART CATHETERIZATION WITH CORONARY ANGIOGRAM;  Surgeon: Runell Gess, MD;  Location: [facility] CATH LAB;  Service: Cardiovascular;  Laterality: N/A;  . REFRACTIVE SURGERY Bilateral 08/2014-09/2014   Diabetic retinopathy     No Known Allergies  Immunization History  Administered Date(s) Administered  . Influenza,inj,Quad PF,6+ Mos 09/27/2014  . Pneumococcal Polysaccharide-23 09/27/2014    Family History  Problem Relation Age of Onset  . Heart disease Mother   . Diabetes Mother   . Hyperlipidemia Mother   . Hypertension Mother   . Cancer Father  Lung Cancer   . Heart disease Father   . Heart attack Father   . Gout Brother   . Asthma Brother   . Hyperlipidemia Brother   . Hypertension Brother   . Diabetes Brother   . Colon cancer Brother        colon  . Stroke Paternal Grandfather    . Colon cancer Sister   . Hyperlipidemia Sister   . Hypertension Sister   . Gout Maternal Uncle   . Diabetes Maternal Grandfather   . Diabetes Daughter      Current Facility-Administered Medications:  .  albuterol (PROVENTIL) (2.5 MG/3ML) 0.083% nebulizer solution 2.5 mg, 2.5 mg, Nebulization, Q6H PRN, Haydee Monicaavid, Rachal A, MD, 2.5 mg at 05/08/19 2022 .  bisacodyl (DULCOLAX) suppository 10 mg, 10 mg, Rectal, Q6H, Amin, Ankit Chirag, MD, 10 mg at 05/08/19 1807 .  ceFEPIme (MAXIPIME) 2 g in sodium chloride 0.9 % 100 mL IVPB, 2 g, Intravenous, Q8H, Tarry Kosavid, Rachal A, MD, Last Rate: 200 mL/hr at 05/09/19 1417, 2 g at 05/09/19 1417 .  dexmedetomidine (PRECEDEX) 400 MCG/100ML (4 mcg/mL) infusion, 0.4-1.2 mcg/kg/hr, Intravenous, Titrated, Jasiyah Poland, MD .  hydrALAZINE (APRESOLINE) injection 10 mg, 10 mg, Intravenous, Q4H PRN, Amin, Ankit Chirag, MD .  insulin aspart (novoLOG) injection 0-15 Units, 0-15 Units, Subcutaneous, TID WC, Amin, Ankit Chirag, MD, 11 Units at 05/09/19 1408 .  insulin aspart (novoLOG) injection 0-5 Units, 0-5 Units, Subcutaneous, QHS, Haydee Monicaavid, Rachal A, MD, 3 Units at 05/08/19 2337 .  insulin detemir (LEVEMIR) injection 20 Units, 20 Units, Subcutaneous, Daily, Amin, Ankit Chirag, MD, 20 Units at 05/09/19 1200 .  ipratropium-albuterol (DUONEB) 0.5-2.5 (3) MG/3ML nebulizer solution 3 mL, 3 mL, Nebulization, Q4H PRN, Bodenheimer, Charles A, NP .  metroNIDAZOLE (FLAGYL) IVPB 500 mg, 500 mg, Intravenous, Q8H, Amin, Ankit Chirag, MD, Last Rate: 100 mL/hr at 05/09/19 0847, 500 mg at 05/09/19 0847 .  ondansetron (ZOFRAN) tablet 4 mg, 4 mg, Oral, Q6H PRN **OR** ondansetron (ZOFRAN) injection 4 mg, 4 mg, Intravenous, Q6H PRN, Haydee Monicaavid, Rachal A, MD .  Melene Muller[START ON 05/10/2019] vancomycin (VANCOCIN) 1,250 mg in sodium chloride 0.9 % 250 mL IVPB, 1,250 mg, Intravenous, Q24H, Amin, Loura HaltAnkit Chirag, MD   Significant Hospital Events   05/07/2019 0 admit 5/30- GI consult 5/31 - CCS consult 5/31 - ccm  consult and move to ICU.  CT scan IMPRESSION: 1. Cholelithiasis with gallbladder wall thickening and pericholecystic fluid is concerning for ACUTE CHOLECYSTITIS. No significant gallbladder distension. 2. Common bile duct is normal.  No evidence of pancreatitis.  Consults:  5/30- GI consult 5/31 - CCS consult 5/31 - ccm consult and move to ICU  Procedures:  x  Significant Diagnostic Tests:  May 07, 2019 right upper quadrant ultrasound IMPRESSION: 1. 9 mm stone versus tumefactive sludge within the gallbladder neck. No sonographic features to suggest acute cholecystitis. 2. No biliary dilatation. 3. Increased echogenicity within the hepatic parenchyma with question of subtle nodularity of the hepatic contour, which could reflect sequelae of cirrhosis and/or steatosis.   Electronically Signed   By: Rise MuBenjamin  McClintock M.D.   On: 05/08/2019 00:16   May 09, 2019 CT scan abdomen - IMPRESSION: 1. Cholelithiasis with gallbladder wall thickening and pericholecystic fluid is concerning for ACUTE CHOLECYSTITIS. No significant gallbladder distension. 2. Common bile duct is normal.  No evidence of pancreatitis.  Micro Data:   Blood culture May 07, 2019 -growing Klebsiella pneumoniae COVID-19 and respiratory virus panel May 07, 2019-negative    Antimicrobials:  Vancomycin -May 30 Cefepime  May 29 Flagyl -May 30     Interim history/subjective:   May 09, 2019: Seen by CCM on 5 W. 12  Objective   Blood pressure (!) 159/103, pulse (!) 120, temperature 97.7 F (36.5 C), temperature source Oral, resp. rate (!) 36, height 5\' 6"  (1.676 m), weight 108.4 kg, SpO2 92 %.        Intake/Output Summary (Last 24 hours) at 05/09/2019 1656 Last data filed at 05/09/2019 1200 Gross per 24 hour  Intake 800 ml  Output 2402 ml  Net -1602 ml   Filed Weights   05/07/19 2037 05/08/19 0059 05/09/19 0558  Weight: 113.4 kg 115.6 kg 108.4 kg    Examination: General: Morbidly obese male  restless in bed HENT: Mallampati class IV oxygen on Lungs: Clear to auscultation bilaterally mildly tachypneic but not paradoxical Cardiovascular: Mild sinus tachycardia heart rate 110/min Abdomen: Obese .  No tenderness.  No rigidity Extremities: No cyanosis no clubbing no edema Neuro: Agitated delirium RA SS sedation score equivalent of +2 GU: External genitalia look normal    LABS    PULMONARY Recent Labs  Lab 05/07/19 2104 05/08/19 1307 05/09/19 0335 05/09/19 1638  PHART 7.437 7.455* 7.380 7.412  PCO2ART 37.0 34.6 43.3 39.6  PO2ART 78.0* 71.8* 59.1* 69.2*  HCO3 25.0 24.0 24.3 24.7  TCO2 26  --   --   --   O2SAT 96.0 92.9 84.3 92.4    CBC Recent Labs  Lab 05/07/19 2052 05/07/19 2104 05/08/19 0513 05/09/19 0408  HGB 14.1 13.6 12.5* 13.0  HCT 44.2 40.0 38.6* 40.7  WBC 14.2*  --  17.8* 8.9  PLT 204  --  204 155    COAGULATION No results for input(s): INR in the last 168 hours.  CARDIAC  No results for input(s): TROPONINI in the last 168 hours. No results for input(s): PROBNP in the last 168 hours.   CHEMISTRY Recent Labs  Lab 05/07/19 2052 05/07/19 2104 05/08/19 0513 05/09/19 0408  NA 139 140 141 147*  K 3.8 3.8 3.8 3.5  CL 103  --  104 110  CO2 24  --  21* 23  GLUCOSE 373*  --  395* 353*  BUN 30*  --  33* 18  CREATININE 1.29*  --  1.51* 1.33*  CALCIUM 9.0  --  8.7* 8.2*  MG  --   --   --  1.7   Estimated Creatinine Clearance: 69 mL/min (A) (by C-G formula based on SCr of 1.33 mg/dL (H)).   LIVER Recent Labs  Lab 05/07/19 2052 05/08/19 0513 05/09/19 0408  AST 309* 532* 334*  ALT 194* 414* 460*  ALKPHOS 86 75 87  BILITOT 2.3* 3.5* 5.1*  PROT 6.3* 6.2* 6.6  ALBUMIN 3.3* 3.0* 2.8*     INFECTIOUS Recent Labs  Lab 05/08/19 1125 05/08/19 1435 05/09/19 0415  LATICACIDVEN 4.2* 3.1* 1.9  PROCALCITON 22.96  --   --      ENDOCRINE CBG (last 3)  Recent Labs    05/09/19 0823 05/09/19 1240 05/09/19 1738  GLUCAP 396* 330* 287*          IMAGING x48h  - image(s) personally visualized  -   highlighted in bold Ct Head Wo Contrast  Result Date: 05/08/2019 CLINICAL DATA:  Altered mental status.  Combative. EXAM: CT HEAD WITHOUT CONTRAST TECHNIQUE: Contiguous axial images were obtained from the base of the skull through the vertex without intravenous contrast. COMPARISON:  None. FINDINGS: Brain: Motion degradation of the exam. Exam was repeated  with minimal improvement. No gross evidence intracranial hemorrhage. No midline shift or mass effect. Difficult to exclude cortical infarction with the degree of motion. Basal cisterns are patent. Vascular: No hyperdense vessel or unexpected calcification. Skull: Normal. Negative for fracture or focal lesion. Sinuses/Orbits: Paranasal sinuses and mastoid air cells are clear. Orbits are clear. Other: None. IMPRESSION: Exam is extremely degraded by head motion. Exam was repeated with little improvement. No gross abnormality. Electronically Signed   By: Genevive Bi M.D.   On: 05/08/2019 19:34   Ct Abdomen Pelvis W Contrast  Result Date: 05/09/2019 CLINICAL DATA:  Fever.  Concern for pancreatitis EXAM: CT ABDOMEN AND PELVIS WITH CONTRAST TECHNIQUE: Multidetector CT imaging of the abdomen and pelvis was performed using the standard protocol following bolus administration of intravenous contrast. CONTRAST:  OMNIPAQUE IOHEXOL 300 MG/ML  SOLN COMPARISON:  None. FINDINGS: Lower chest: Bilateral small pleural effusions. No infiltrate lung bases. Central venous congestion. Hepatobiliary: Normal hepatic parenchyma. The gallbladder wall is mildly thickened enhancing this is minimal finding with the gallbladder wall measuring 2 mm. There is pericholecystic fluid in the gallbladder fossa (image 38/3. Several small gallstones are present. The gallbladder bladder is not distended. Common bile duct is normal caliber. Pancreas: Pancreas is normal. No ductal dilatation. No pancreatic inflammation.  Spleen: Stomach, small bowel, appendix, and cecum are normal. The colon and rectosigmoid colon are normal. Adrenals/urinary tract: Adrenal glands and kidneys are normal. The ureters and bladder normal. Foley catheter in bladder. Stomach/Bowel: Stomach, small bowel, appendix, and cecum are normal. The colon and rectosigmoid colon are normal. Vascular/Lymphatic: Abdominal aorta is normal caliber with atherosclerotic calcification. There is no retroperitoneal or periportal lymphadenopathy. No pelvic lymphadenopathy. Reproductive: Prostate normal. Other: No free fluid. Musculoskeletal: No aggressive osseous lesion. IMPRESSION: 1. Cholelithiasis with gallbladder wall thickening and pericholecystic fluid is concerning for ACUTE CHOLECYSTITIS. No significant gallbladder distension. 2. Common bile duct is normal.  No evidence of pancreatitis. These results will be called to the ordering clinician or representative by the Radiologist Assistant, and communication documented in the PACS or zVision Dashboard. Electronically Signed   By: Genevive Bi M.D.   On: 05/09/2019 13:36   US Abdomen Limited  Result Date: 05/08/2019 CLINICAL DATA:  Initial evaluation for acute fever, elevated LFTs and bilirubin. EXAM: ULTRASOUND ABDOMEN LIMITED RIGHT UPPER QUADRANT COMPARISON:  Prior CT from 01/19/2015 FINDINGS: Gallbladder: 9 mm echogenic focus at the gallbladder neck could reflect a small stone versus tumefactive sludge. Gallbladder wall measured at the upper limits of normal at 3 mm. No free pericholecystic fluid. No sonographic Murphy sign elicited on exam. Common bile duct: Diameter: 3.7 mm Liver: No focal lesion identified. Diffusely increased echogenicity within the hepatic parenchyma. Question of fine nodularity of the hepatic contour. Portal vein is patent on color Doppler imaging with normal direction of blood flow towards the liver. IMPRESSION: 1. 9 mm stone versus tumefactive sludge within the gallbladder neck. No  sonographic features to suggest acute cholecystitis. 2. No biliary dilatation. 3. Increased echogenicity within the hepatic parenchyma with question of subtle nodularity of the hepatic contour, which could reflect sequelae of cirrhosis and/or steatosis. Electronically Signed   By: Rise Mu M.D.   On: 05/08/2019 00:16   Dg Chest Port 1 View  Result Date: 05/09/2019 CLINICAL DATA:  Altered mental status and shortness of breath today. EXAM: PORTABLE CHEST 1 VIEW COMPARISON:  Single-view of the chest earlier today. FINDINGS: Cardiomegaly and pulmonary edema persist without notable change. No pneumothorax or pleural effusion. No acute  or focal bony abnormality. IMPRESSION: No change in findings most compatible with congestive heart failure. Electronically Signed   By: Drusilla Kanner M.D.   On: 05/09/2019 16:59   Dg Chest Port 1 View  Result Date: 05/09/2019 CLINICAL DATA:  Shortness of breath. EXAM: PORTABLE CHEST 1 VIEW COMPARISON:  05/08/2019 FINDINGS: Heart size remains stable. New diffuse interstitial and airspace opacities seen throughout both lungs, suspicious for diffuse pulmonary edema. No evidence of focal consolidation or definite pleural effusion. IMPRESSION: New diffuse interstitial and airspace opacities, likely due to diffuse pulmonary edema. Electronically Signed   By: Myles Rosenthal M.D.   On: 05/09/2019 03:40   Dg Chest Port 1 View  Result Date: 05/07/2019 CLINICAL DATA:  Fever EXAM: PORTABLE CHEST 1 VIEW COMPARISON:  12/05/2018 FINDINGS: Mild cardiomegaly with pulmonary vascular congestion. No overt edema. No pneumothorax or sizable pleural effusion. No focal airspace consolidation. IMPRESSION: Mild cardiomegaly with pulmonary vascular congestion. Electronically Signed   By: Deatra Robinson M.D.   On: 05/07/2019 21:38   Dg Abd Acute 2+v W 1v Chest  Result Date: 05/08/2019 CLINICAL DATA:  Shortness of breath, abdominal distension EXAM: DG ABDOMEN ACUTE W/ 1V CHEST COMPARISON:   05/07/2019 FINDINGS: Lungs are clear.  No pleural effusion or pneumothorax. The heart is normal in size. Nonobstructive bowel gas pattern. No evidence of free air on the lateral decubitus view. Degenerative changes of the visualized thoracolumbar spine. IMPRESSION: No evidence of acute cardiopulmonary disease. No evidence of small bowel obstruction or free air. Electronically Signed   By: Charline Bills M.D.   On: 05/08/2019 11:18   Dg Abd Portable 1v  Result Date: 05/09/2019 CLINICAL DATA:  Abdominal distention. Shortness of breath. Congestive heart failure. EXAM: PORTABLE ABDOMEN - 1 VIEW COMPARISON:  None. FINDINGS: The bowel gas pattern is normal. No radio-opaque calculi or other significant radiographic abnormality are seen. IMPRESSION: Negative. Electronically Signed   By: Myles Rosenthal M.D.   On: 05/09/2019 03:40   [place] Ascites (abdomen Limited)  Result Date: 05/08/2019 CLINICAL DATA:  Abdominal distension. EXAM: LIMITED ABDOMEN ULTRASOUND FOR ASCITES TECHNIQUE: Limited ultrasound survey for ascites was performed in all four abdominal quadrants. COMPARISON:  None. FINDINGS: No ascites. IMPRESSION: No ascites. Electronically Signed   By: Gerome Sam III M.D   On: 05/08/2019 21:49   [place] Ekg Site Rite  Result Date: 05/09/2019 If Site Rite image not attached, placement could not be confirmed due to current cardiac rhythm.    Resolved Hospital Problem list   x  Assessment & Plan:  ASSESSMENT / PLAN:  PULMONARY A:  Probably underlying OSA At high risk intubation due to encephalopathy and MEWS scoer 6   P:   ICU monitoring No BiPAP ';; mental status precludes Intubate if needed   NEUROLOGIC A:   Acute agitated enceophalopathy P:   precedex gtt    VASCULAR A:   Hypertension  P:  Dc lasix Rx volume  LR bolus due to pancreatitis  CARDIAC A: Hx of CAD - on plavix and asa at home - plavix on hold due to initial concern for surgery or gi procedure need need At risk  NSTEMI  P: Check trop  Hold ASA and Plavix but restart 05/10/19 if not procedure or has MI   INFECTIOUS A:   SEvere spsis from pancreatitis Klebsiella bacteremia  P:   Dc vanc (has AKI as well) contine flagyl for now  Continue cefepime Might need to consolidate abx due to pancreatitis + klesiella  RENAL A:  Baseline creat 1.17mg % AKI this admit with creat 1.3mg %  P:  Hydrate Dc lasix Avoid nephrrotoxins (dc vanc, no ketorolac)  ELECTROLYTES A:  Mild low mag P: Replete mag   GASTROINTESTINAL A:   Severe pancreatitis due to gall stone but no Acute chole  Per GI and CCS   P:   NPO Hydrate MRCP when more stable (CCS and GI coordinating)  HEMATOLOGIC A:  Anemia (at risk)   P:  - PRBC for hgb </= 6.9gm%    - exceptions are   -  if ACS susepcted/confirmed then transfuse for hgb </= 8.0gm%,  or    -  active bleeding with hemodynamic instability, then transfuse regardless of hemoglobin value   At at all times try to transfuse 1 unit prbc as possible with exception of active hemorrhage     ENDOCRINE A:   At risk hypo/hyperglycemai   P:   ssi  MSK/DERM At risk bed sores Monitor   Best practice:  Diet: nop Pain/Anxiety/Delirium protocol (if indicated): precedex VAP protocol (if indicated): HOB > 30 deg DVT prophylaxis: lovenox GI prophylaxis: pnot intubated Glucose control: ssi Mobility: bed rest Code Status: full Family Communication:  Per [facility] - Dr [name] Chimes will update 05/09/2019  Disposition: move to ICU     ATTESTATION & SIGNATURE   The patient Laquincy Eastridge is critically ill with multiple organ systems failure and requires high complexity decision making for assessment and support, frequent evaluation and titration of therapies, application of advanced monitoring technologies and extensive interpretation of multiple databases.   Critical Care Time devoted to patient care services described in this note is  45  Minutes. This time reflects  time of care of this signee Dr Kalman Shan. This critical care time does not reflect procedure time, or teaching time or supervisory time of PA/NP/Med student/Med Resident etc but could involve care discussion time     Dr. Kalman Shan, M.D., [facility].C.P Pulmonary and Critical Care Medicine Staff Physician [facility] [place] Pager: [phone], If no answer or between  15:00h - 7:00h: call [phone]  05/09/2019 4:59 PM

## 2019-05-10 ENCOUNTER — Inpatient Hospital Stay (HOSPITAL_COMMUNITY): Payer: Medicare HMO

## 2019-05-10 DIAGNOSIS — K851 Biliary acute pancreatitis without necrosis or infection: Secondary | ICD-10-CM

## 2019-05-10 DIAGNOSIS — K810 Acute cholecystitis: Secondary | ICD-10-CM

## 2019-05-10 DIAGNOSIS — I351 Nonrheumatic aortic (valve) insufficiency: Secondary | ICD-10-CM

## 2019-05-10 LAB — ECHOCARDIOGRAM COMPLETE
Height: 66 in
Weight: 4017.66 oz

## 2019-05-10 LAB — LIPASE, BLOOD: Lipase: 17 U/L (ref 11–51)

## 2019-05-10 LAB — COMPREHENSIVE METABOLIC PANEL
ALT: 358 U/L — ABNORMAL HIGH (ref 0–44)
AST: 230 U/L — ABNORMAL HIGH (ref 15–41)
Albumin: 2.6 g/dL — ABNORMAL LOW (ref 3.5–5.0)
Alkaline Phosphatase: 84 U/L (ref 38–126)
Anion gap: 13 (ref 5–15)
BUN: 29 mg/dL — ABNORMAL HIGH (ref 6–20)
CO2: 22 mmol/L (ref 22–32)
Calcium: 8.4 mg/dL — ABNORMAL LOW (ref 8.9–10.3)
Chloride: 117 mmol/L — ABNORMAL HIGH (ref 98–111)
Creatinine, Ser: 1.72 mg/dL — ABNORMAL HIGH (ref 0.61–1.24)
GFR calc Af Amer: 49 mL/min — ABNORMAL LOW (ref >60)
GFR calc non Af Amer: 43 mL/min — ABNORMAL LOW (ref >60)
Glucose, Bld: 239 mg/dL — ABNORMAL HIGH (ref 70–99)
Potassium: 3.5 mmol/L (ref 3.5–5.1)
Sodium: 152 mmol/L — ABNORMAL HIGH (ref 135–145)
Total Bilirubin: 4.1 mg/dL — ABNORMAL HIGH (ref 0.3–1.2)
Total Protein: 6.6 g/dL (ref 6.5–8.1)

## 2019-05-10 LAB — CULTURE, BLOOD (ROUTINE X 2)
Special Requests: ADEQUATE
Special Requests: ADEQUATE

## 2019-05-10 LAB — BASIC METABOLIC PANEL
Anion gap: 8 (ref 5–15)
BUN: 35 mg/dL — ABNORMAL HIGH (ref 6–20)
CO2: 23 mmol/L (ref 22–32)
Calcium: 7.8 mg/dL — ABNORMAL LOW (ref 8.9–10.3)
Chloride: 123 mmol/L — ABNORMAL HIGH (ref 98–111)
Creatinine, Ser: 1.52 mg/dL — ABNORMAL HIGH (ref 0.61–1.24)
GFR calc Af Amer: 57 mL/min — ABNORMAL LOW (ref >60)
GFR calc non Af Amer: 49 mL/min — ABNORMAL LOW (ref >60)
Glucose, Bld: 235 mg/dL — ABNORMAL HIGH (ref 70–99)
Potassium: 3.5 mmol/L (ref 3.5–5.1)
Sodium: 154 mmol/L — ABNORMAL HIGH (ref 135–145)

## 2019-05-10 LAB — GLUCOSE, CAPILLARY
Glucose-Capillary: 182 mg/dL — ABNORMAL HIGH (ref 70–99)
Glucose-Capillary: 223 mg/dL — ABNORMAL HIGH (ref 70–99)
Glucose-Capillary: 232 mg/dL — ABNORMAL HIGH (ref 70–99)
Glucose-Capillary: 236 mg/dL — ABNORMAL HIGH (ref 70–99)
Glucose-Capillary: 246 mg/dL — ABNORMAL HIGH (ref 70–99)

## 2019-05-10 LAB — MAGNESIUM
Magnesium: 2.8 mg/dL — ABNORMAL HIGH (ref 1.7–2.4)
Magnesium: 2.6 mg/dL — ABNORMAL HIGH (ref 1.7–2.4)

## 2019-05-10 LAB — PROCALCITONIN: Procalcitonin: 6.28 ng/mL

## 2019-05-10 LAB — CBC
HCT: 41.6 % (ref 39.0–52.0)
Hemoglobin: 12.9 g/dL — ABNORMAL LOW (ref 13.0–17.0)
MCH: 28.9 pg (ref 26.0–34.0)
MCHC: 31 g/dL (ref 30.0–36.0)
MCV: 93.1 fL (ref 80.0–100.0)
Platelets: 144 10*3/uL — ABNORMAL LOW (ref 150–400)
RBC: 4.47 MIL/uL (ref 4.22–5.81)
RDW: 17.1 % — ABNORMAL HIGH (ref 11.5–15.5)
WBC: 7.2 10*3/uL (ref 4.0–10.5)
nRBC: 0 % (ref 0.0–0.2)

## 2019-05-10 LAB — PROTIME-INR
INR: 1.3 — ABNORMAL HIGH (ref 0.8–1.2)
Prothrombin Time: 16.4 seconds — ABNORMAL HIGH (ref 11.4–15.2)

## 2019-05-10 LAB — TROPONIN I
Troponin I: 0.15 ng/mL (ref <0.03)
Troponin I: 0.07 ng/mL (ref <0.03)

## 2019-05-10 LAB — LACTIC ACID, PLASMA: Lactic Acid, Venous: 1.6 mmol/L (ref 0.5–1.9)

## 2019-05-10 MED ORDER — SODIUM CHLORIDE 0.9% FLUSH: 10.0000 mL | INTRAVENOUS | Status: DC | PRN

## 2019-05-10 MED ORDER — INSULIN DETEMIR 100 UNIT/ML ~~LOC~~ SOLN
30.0000 [IU] | Freq: Every day | SUBCUTANEOUS | Status: DC
  Administered 2019-05-10 – 2019-05-14 (×5): 30 [IU] via SUBCUTANEOUS
  Filled 2019-05-10 (×5): qty 0.3

## 2019-05-10 MED ORDER — SODIUM CHLORIDE 0.9% FLUSH
10.0000 mL | Freq: Two times a day (BID) | INTRAVENOUS | Status: DC
  Administered 2019-05-10 – 2019-05-17 (×13): 10 mL

## 2019-05-10 MED ORDER — CHLORHEXIDINE GLUCONATE CLOTH 2 % EX PADS
6.0000 | MEDICATED_PAD | Freq: Every day | CUTANEOUS | Status: DC
  Administered 2019-05-12 – 2019-05-16 (×6): 6 via TOPICAL

## 2019-05-10 MED ORDER — FENTANYL CITRATE (PF) 100 MCG/2ML IJ SOLN
50.0000 ug | INTRAMUSCULAR | Status: DC | PRN
  Administered 2019-05-10 – 2019-05-11 (×4): 50 ug via INTRAVENOUS
  Filled 2019-05-10 (×4): qty 2

## 2019-05-10 MED ORDER — CHLORHEXIDINE GLUCONATE CLOTH 2 % EX PADS: 6.0000 | MEDICATED_PAD | Freq: Every day | CUTANEOUS | Status: DC

## 2019-05-10 MED ORDER — DEXMEDETOMIDINE HCL IN NACL 400 MCG/100ML IV SOLN
0.4000 ug/kg/h | INTRAVENOUS | Status: DC
  Administered 2019-05-10: 1.2 ug/kg/h via INTRAVENOUS
  Administered 2019-05-10: 15:00:00 1 ug/kg/h via INTRAVENOUS
  Administered 2019-05-10 (×2): 0.6 ug/kg/h via INTRAVENOUS
  Administered 2019-05-11 (×2): 0.8 ug/kg/h via INTRAVENOUS
  Administered 2019-05-11: 0.5 ug/kg/h via INTRAVENOUS
  Administered 2019-05-11 (×2): 0.6 ug/kg/h via INTRAVENOUS
  Administered 2019-05-12: 06:00:00 0.5 ug/kg/h via INTRAVENOUS
  Filled 2019-05-10 (×10): qty 100

## 2019-05-10 MED ORDER — HALOPERIDOL LACTATE 5 MG/ML IJ SOLN
2.0000 mg | Freq: Four times a day (QID) | INTRAMUSCULAR | Status: DC | PRN
  Administered 2019-05-10: 12:00:00 2 mg via INTRAVENOUS
  Filled 2019-05-10: qty 1

## 2019-05-10 MED ORDER — CEFAZOLIN SODIUM-DEXTROSE 2-4 GM/100ML-% IV SOLN
2.0000 g | Freq: Three times a day (TID) | INTRAVENOUS | Status: DC
  Administered 2019-05-10 – 2019-05-13 (×9): 2 g via INTRAVENOUS
  Filled 2019-05-10 (×11): qty 100

## 2019-05-10 MED ORDER — SODIUM CHLORIDE 0.9 % IV SOLN
INTRAVENOUS | Status: DC
  Administered 2019-05-10 (×2): via INTRAVENOUS

## 2019-05-10 MED ORDER — ORAL CARE MOUTH RINSE
15.0000 mL | Freq: Two times a day (BID) | OROMUCOSAL | Status: DC
  Administered 2019-05-10 – 2019-05-11 (×3): 15 mL via OROMUCOSAL

## 2019-05-10 MED ORDER — ENOXAPARIN SODIUM 40 MG/0.4ML ~~LOC~~ SOLN
40.0000 mg | SUBCUTANEOUS | Status: DC
  Administered 2019-05-10: 40 mg via SUBCUTANEOUS
  Filled 2019-05-10: qty 0.4

## 2019-05-10 MED ORDER — CEFAZOLIN SODIUM-DEXTROSE 1-4 GM/50ML-% IV SOLN
1.0000 g | Freq: Three times a day (TID) | INTRAVENOUS | Status: DC
  Filled 2019-05-10: qty 50

## 2019-05-10 MED ORDER — ASPIRIN 300 MG RE SUPP
150.0000 mg | Freq: Every day | RECTAL | Status: DC
  Administered 2019-05-10 – 2019-05-15 (×5): 150 mg via RECTAL
  Filled 2019-05-10 (×6): qty 1

## 2019-05-10 MED ORDER — ASPIRIN 81 MG PO CHEW: 81.0000 mg | CHEWABLE_TABLET | Freq: Every day | ORAL | Status: DC

## 2019-05-10 NOTE — Progress Notes (Signed)
Inpatient Diabetes Program Recommendations  AACE/ADA: New Consensus Statement on Inpatient Glycemic Control (2015)  Target Ranges:  Prepandial:   less than 140 mg/dL      Peak postprandial:   less than 180 mg/dL (1-2 hours)      Critically ill patients:  140 - 180 mg/dL   Results for Johnathan Keith, Johnathan Keith (MRN [id number]) as of 05/10/2019 13:27  Ref. Range 05/09/2019 06:21 05/09/2019 08:23 05/09/2019 12:40 05/09/2019 17:38 05/09/2019 22:28  Glucose-Capillary Latest Ref Range: 70 - 99 mg/dL 409 (H) 811 (H)  15 units NOVOLOG  330 (H)  11 units NOVOLOG +  20 units LEVEMIR  287 (H)  8 units NOVOLOG  179 (H)   Results for Johnathan Keith, Johnathan Keith (MRN [id number]) as of 05/10/2019 13:27  Ref. Range 05/10/2019 07:49 05/10/2019 11:53  Glucose-Capillary Latest Ref Range: 70 - 99 mg/dL 213 (H)  5 units NOVOLOG +  30 units LEVEMIR  246 (H)  5 units NOVOLOG       Admit Gallstone Pancreatitis/K.pneumoniae bacteremia - likely from cholangitis related to biliary obstruction/ Delerium  History: DM  Home DM Meds: NPH 40 units AM/ 55 units PM        Regular 10-20 units TID       Amaryl 4 mg BID  Current Orders: Levemir 30 units Daily       Novolog Moderate Correction Scale/ SSI (0-15 units) TID AC + HS       Levemir increased today (got 20 units yest AM--will get 30 units this AM).   Just started CL diet this AM.     MD- Please consider the following in-hospital insulin adjustments:  1. If AM CBG tomorrow (06/02) still elevated, please consider increasing Levemir further to 36 units Daily (20% increase)   2. May want to Increase frequency of CBG checks and Novolog SSI coverage to Q4 hours while PO intake still variable     --Will follow patient during hospitalization--  Ambrose Finland RN, MSN, CDE Diabetes Coordinator Inpatient Glycemic Control Team Team Pager: [phone] (8a-5p)

## 2019-05-10 NOTE — Progress Notes (Signed)
Called elink, informed nurse Abby pt troponin level 0.11. UTA pain d/t pt aloc. Will perform ekg and continue to monitor pt.

## 2019-05-10 NOTE — Progress Notes (Signed)
Peripherally Inserted Central Catheter/Midline Placement  The IV Nurse has discussed with the patient and/or persons authorized to consent for the patient, the purpose of this procedure and the potential benefits and risks involved with this procedure.  The benefits include less needle sticks, lab draws from the catheter, and the patient may be discharged home with the catheter. Risks include, but not limited to, infection, bleeding, blood clot (thrombus formation), and puncture of an artery; nerve damage and irregular heartbeat and possibility to perform a PICC exchange if needed/ordered by physician.  Alternatives to this procedure were also discussed.  Bard Power PICC patient education guide, fact sheet on infection prevention and patient information card has been provided to patient /or left at bedside.    PICC/Midline Placement Documentation  PICC Double Lumen 05/10/19 PICC Right Cephalic 42 cm 0 cm (Active)  Indication for Insertion or Continuance of Line Prolonged intravenous therapies 05/10/2019  9:43 AM  Exposed Catheter (cm) 0 cm 05/10/2019  9:43 AM  Site Assessment Clean;Dry;Intact 05/10/2019  9:43 AM  Lumen #1 Status Flushed;Blood return noted 05/10/2019  9:43 AM  Lumen #2 Status Flushed;Blood return noted 05/10/2019  9:43 AM  Dressing Type Transparent 05/10/2019  9:43 AM  Dressing Status Clean;Dry;Intact 05/10/2019  9:43 AM  Dressing Intervention New dressing 05/10/2019  9:43 AM  Dressing Change Due 05/17/19 05/10/2019  9:43 AM   Telephone consent signed by wife    Maximino Greenland 05/10/2019, 9:44 AM

## 2019-05-10 NOTE — Progress Notes (Signed)
  Echocardiogram 2D Echocardiogram has been performed.  Johnathan Keith 05/10/2019, 2:18 PM

## 2019-05-10 NOTE — Progress Notes (Addendum)
Pharmacy Antibiotic Note  Johnathan Keith is a 58 y.o. male admitted on 05/07/2019 with sepsis. Found to have K. Pneumonia bacteremia (resistant to ampicillin) likely from cholangitis related to biliary obstruction. Pharmacy consulted cefazolin dosing.  Plan: Cefazolin 2 g IV q8h Metronidazole 500 mg IV q8h per MD Plan to treat for 10 days total  Height: 5\' 6"  (167.6 cm) Weight: 251 lb 1.7 oz (113.9 kg) IBW/kg (Calculated) : 63.8  Temp (24hrs), Avg:99.6 F (37.6 C), Min:97.7 F (36.5 C), Max:103 F (39.4 C)  Recent Labs  Lab 05/07/19 2052  05/08/19 0513 05/08/19 1125 05/08/19 1435 05/09/19 0408 05/09/19 0415 05/10/19 0441  WBC 14.2*  --  17.8*  --   --  8.9  --  7.2  CREATININE 1.29*  --  1.51*  --   --  1.33*  --  1.72*  LATICACIDVEN 3.9*   < > 4.1* 4.2* 3.1*  --  1.9 1.6   < > = values in this interval not displayed.    Estimated Creatinine Clearance: 54.8 mL/min (A) (by C-G formula based on SCr of 1.72 mg/dL (H)).    No Known Allergies  Antimicrobials this admission: Vanc 5/290>>5/31 Cefepime 5/29>>6/1 Flagyl IV 5/30>>6/9 Cefazolin 6/1>>6/9  Microbiology: 5/29 BCx: klebsiella pneu (R-amp) 5/29 Covid: negative 5/30 UCx: ngF 5/30 Resp PCR: neg 5/31 MRSA: neg  Thank you for allowing pharmacy to be a part of this patient's care.  Danae Orleans, PharmD PGY1 Pharmacy Resident Phone [phone] 05/10/2019       9:14 AM

## 2019-05-10 NOTE — Progress Notes (Signed)
NAME:  Johnathan Keith, MRN:  [id number], DOB:  1961/05/20, LOS: 2 ADMISSION DATE:  05/07/2019, CONSULTATION DATE:  05/09/2019 REFERRING MD:  Dr [name] Chimes, CHIEF COMPLAINT:  Acute encephalopathy, SIRs, gallstone pancreatitis   Brief History   See belo  History of present illness   58 year old obese male with acid reflux, hypertension, coronary artery disease and diabetes.  He arrived in the emergency department May 07, 2019 because of fever, chills, generalized weakness and confusion associated with nausea and vomiting.  Initially fever was 100.3 and tachycardic.  His initial lactic acid was 3.9.  His COVID-19 test was negative.  According to the bedside nurse for most of the day on May 08, 2019 he had hypoactive delirium and significant sirs physiology.  However today they feel he is somewhat improved with improved AKI and resolved lactic acidosis and improving LFT and HR and RR.  However, because of his agitated delirium  has been difficult to control and MEWS score is 6. Therefore critical care medicine has been consulted.  Evaluation is shown that GI and [facility] have consulted on him and the unifying diagnosis is 1 of gallstone pancreatitis [admission diagnosis based on chemical lipase elevation of 1276] without evidence of acute cholecystitis according to consultation by the 2 clinicians.  [his CT scan did not show any pancreatitis but suggested he had cholecystitis].  He has a small stone in the gallbladder neck without any biliary dilatation.  Aggressive ICU medical management has been recommended.  Growing klebisella in blood  Past Medical History     has a past medical history of Chronic neck and back pain, Coronary artery disease, Deviated septum, Diabetic retinopathy (HCC), GERD (gastroesophageal reflux disease), Headache, Heart attack (HCC), High cholesterol, History of gout, Hypertension, Kidney stones, Migraine, Peripheral vascular disease (HCC), PONV (postoperative nausea  and vomiting), Sleep apnea, Stroke (HCC) (2014), and Type II diabetes mellitus (HCC).   reports that he has never smoked. He has never used smokeless tobacco.  Past Surgical History:  Procedure Laterality Date  . CIRCUMCISION  1981  . CORONARY ANGIOPLASTY WITH STENT PLACEMENT  03/23/2015   "2"  . CORONARY STENT INTERVENTION N/A 09/29/2017   Procedure: CORONARY STENT INTERVENTION;  Surgeon: Runell Gess, MD;  Location: [facility] INVASIVE CV LAB;  Service: Cardiovascular;  Laterality: N/A;  . ENDARTERECTOMY Right 09/26/2014   Procedure: RIGHT CAROTID ENDARTERECTOMY WITH PATCH ANGIOPLASTY;  Surgeon: Fransisco Hertz, MD;  Location: [facility];  Service: Vascular;  Laterality: Right;  . EYE SURGERY Right May 2016   Cataract  . EYE SURGERY Left July 2016   Cataract  . HYDROCELE EXCISION  ~ 2008  . LEFT HEART CATH AND CORONARY ANGIOGRAPHY N/A 09/29/2017   Procedure: LEFT HEART CATH AND CORONARY ANGIOGRAPHY;  Surgeon: Runell Gess, MD;  Location: [facility] INVASIVE CV LAB;  Service: Cardiovascular;  Laterality: N/A;  . LEFT HEART CATHETERIZATION WITH CORONARY ANGIOGRAM N/A 03/23/2015   Procedure: LEFT HEART CATHETERIZATION WITH CORONARY ANGIOGRAM;  Surgeon: Runell Gess, MD;  Location: [facility] CATH LAB;  Service: Cardiovascular;  Laterality: N/A;  . REFRACTIVE SURGERY Bilateral 08/2014-09/2014   Diabetic retinopathy      Significant Hospital Events   05/07/2019 0 admit 5/30- GI consult 5/31 - CCS consult 5/31 - ccm consult and move to ICU.  CT scan IMPRESSION: 1. Cholelithiasis with gallbladder wall thickening and pericholecystic fluid is concerning for ACUTE CHOLECYSTITIS. No significant gallbladder distension. 2. Common bile duct is normal.  No evidence of pancreatitis.  Consults:  5/30- GI consult 5/31 - CCS consult 5/31 - ccm consult and move to ICU  Procedures:  N/A  Significant Diagnostic Tests:  May 07, 2019 right upper quadrant ultrasound IMPRESSION: 1. 9 mm stone versus tumefactive  sludge within the gallbladder neck. No sonographic features to suggest acute cholecystitis. 2. No biliary dilatation. 3. Increased echogenicity within the hepatic parenchyma with question of subtle nodularity of the hepatic contour, which could reflect sequelae of cirrhosis and/or steatosis.   May 09, 2019 CT scan abdomen - IMPRESSION: 1. Cholelithiasis with gallbladder wall thickening and pericholecystic fluid is concerning for ACUTE CHOLECYSTITIS. No significant gallbladder distension. 2. Common bile duct is normal.  No evidence of pancreatitis.  Micro Data:   Blood culture May 07, 2019 -growing Klebsiella pneumoniae COVID-19 and respiratory virus panel May 07, 2019-negative  Antimicrobials:  Vancomycin -May 30 Cefepime May 29 Flagyl -May 30  Interim history/subjective:  Denies abdominal pain. Thirsty but not hungry.  Objective   Blood pressure 131/70, pulse 81, temperature 97.9 F (36.6 C), temperature source Rectal, resp. rate (!) 29, height 5\' 6"  (1.676 m), weight 113.9 kg, SpO2 95 %.        Intake/Output Summary (Last 24 hours) at 05/10/2019 0829 Last data filed at 05/10/2019 0600 Gross per 24 hour  Intake 1402.67 ml  Output 2602 ml  Net -1199.33 ml   Filed Weights   05/08/19 0059 05/09/19 0558 05/10/19 0500  Weight: 115.6 kg 108.4 kg 113.9 kg    Examination: General: Obese man, calm, HENT: dry mucus membranes Lungs: Clear to auscultation bilaterally  Cardiovascular: HS normal, extremities cool, Abdomen: Obese .  No tenderness.  No rigidity Extremities: No cyanosis no clubbing no edema Neuro: Calm answers are appropriate but perseverates on answers. No focal deficits. GU: Minimal concentrated urine in Foley bag.  LABS    PULMONARY Recent Labs  Lab 05/07/19 2104 05/08/19 1307 05/09/19 0335 05/09/19 1638  PHART 7.437 7.455* 7.380 7.412  PCO2ART 37.0 34.6 43.3 39.6  PO2ART 78.0* 71.8* 59.1* 69.2*  HCO3 25.0 24.0 24.3 24.7  TCO2 26  --   --   --    O2SAT 96.0 92.9 84.3 92.4    CBC Recent Labs  Lab 05/08/19 0513 05/09/19 0408 05/10/19 0441  HGB 12.5* 13.0 12.9*  HCT 38.6* 40.7 41.6  WBC 17.8* 8.9 7.2  PLT 204 155 144*    COAGULATION Recent Labs  Lab 05/10/19 0441  INR 1.3*    CARDIAC   Recent Labs  Lab 05/09/19 1830 05/10/19 0441  TROPONINI 0.11* 0.15*   No results for input(s): PROBNP in the last 168 hours.   CHEMISTRY Recent Labs  Lab 05/07/19 2052 05/07/19 2104 05/08/19 0513 05/09/19 0408 05/10/19 0441  NA 139 140 141 147* 152*  K 3.8 3.8 3.8 3.5 3.5  CL 103  --  104 110 117*  CO2 24  --  21* 23 22  GLUCOSE 373*  --  395* 353* 239*  BUN 30*  --  33* 18 29*  CREATININE 1.29*  --  1.51* 1.33* 1.72*  CALCIUM 9.0  --  8.7* 8.2* 8.4*  MG  --   --   --  1.7 2.8*   Estimated Creatinine Clearance: 54.8 mL/min (A) (by C-G formula based on SCr of 1.72 mg/dL (H)).   LIVER Recent Labs  Lab 05/07/19 2052 05/08/19 0513 05/09/19 0408 05/10/19 0441  AST 309* 532* 334* 230*  ALT 194* 414* 460* 358*  ALKPHOS 86 75 87 84  BILITOT 2.3* 3.5*  5.1* 4.1*  PROT 6.3* 6.2* 6.6 6.6  ALBUMIN 3.3* 3.0* 2.8* 2.6*  INR  --   --   --  1.3*     INFECTIOUS Recent Labs  Lab 05/08/19 1125 05/08/19 1435 05/09/19 0408 05/09/19 0415 05/10/19 0441  LATICACIDVEN 4.2* 3.1*  --  1.9 1.6  PROCALCITON 22.96  --  7.98  --  6.28     ENDOCRINE CBG (last 3)  Recent Labs    05/09/19 2228 05/10/19 0022 05/10/19 0749  GLUCAP 179* 182* 232[facility]*   Resolved Hospital Problem list   N/A  Assessment & Plan:   Critically ill due to agitated delirium requiring titration of dexmedetomidine to achieve adequate control of agitation to prevent self-harm. Currently NPO, limits options for sedative medications. Plan: Continue to titrate Precedex. At high risk for oversedation and airway compromise.  Critically ill due to possible airway compromise due to body habitus and abdominal distention and possible undiagnosed OSA. At  high risk for oversedation. Remains on high-flow O2 Plan: Avoid respiratory depressants. Monitor airway protection and ventilation while on Precedex.  K.pneumoniae bacteremia - likely from cholangitis related to biliary obstruction.  Currently no signs of obstruction requiring intervention. Stone has passed.  Plan: Taper antibiotics to cefazolin and metronidazole and treat for 10 days.  Acute gallstone (microlithiasis) related pancreatitis. Lipase had normalized No indication for surgical intervention. Plan: Advance diet  Prior CAD Mild demand related troponin rise Plan: Continue home secondary prevention.  AKI  No indication for HD Plan: Maintain euvolemia Maintenance IV fluids until creatinine/urine output improve. Follow creatinine.   Best practice:  Diet: Clear liquids Pain/Anxiety/Delirium protocol (if indicated): off precedex. PRN haloperidol  VAP protocol (if indicated): HOB > 30 deg DVT prophylaxis: lovenox GI prophylaxis: not intubated Glucose control: on basal/bolus insulin. Increased Levemir . Mobility: bed rest Code Status: full Family Communication:  Per [facility] - Dr [name] ChimesAmin will update 05/10/2019 Disposition: Monitor here in ICU today prior to transfer back to stepdown tomorrow.    CRITICAL CARE Performed by: Lynnell Catalanavi Tavis Kring   Total critical care time: 35 minutes  Critical care time was exclusive of separately billable procedures and treating other patients.  Critical care was necessary to treat or prevent imminent or life-threatening deterioration.  Critical care was time spent personally by me on the following activities: development of treatment plan with patient and/or surrogate as well as nursing, discussions with consultants, evaluation of patient's response to treatment, examination of patient, obtaining history from patient or surrogate, ordering and performing treatments and interventions, ordering and review of laboratory studies, ordering and review  of radiographic studies, pulse oximetry, re-evaluation of patient's condition and participation in multidisciplinary rounds.  Lynnell Catalanavi Miyani Cronic, MD [facility] ICU Physician [facility]  Pager: [phone] Mobile: [phone] After hours: [phone].    05/10/2019 8:29 AM

## 2019-05-10 NOTE — Progress Notes (Signed)
[facility]/Trauma Progress Note      Assessment/Plan CAD GERD Hx of MI HLD HTN PVD Hx of stroke, left sided weakness  Sepsis  Bacteremia - Klebsiella pneumoniae  Gallstone pancreatitis with probable acute cholecystitis - pt will need lap chole this admission - recommend cardiac clearance in setting of hx of MI and elevated troponins - we will follow for surgical readiness  FEN: CLD okay VTE: SCD's, lovenox ID: maxepime 05/29-06/01 Flagyl 05/30>>  Ancef 06/01>>   Foley: yes per medicine Follow up: TBD    LOS: 2 days    Subjective: CC: no compaints  Nurse states precidex was just stopped this am around 0930. Pt is alert. He states he told his doctor that he thought he had gallbladder issues cause his sugars were high. He states no recent RUQ abdominal pain. He states no pain or nausea at this time. He does not feel bloated. Pt denies hx of abdominal surgeries.   Objective: Vital signs in last 24 hours: Temp:  [97.7 F (36.5 C)-103 F (39.4 C)] 97.9 F (36.6 C) (06/01 0700) Pulse Rate:  [59-104] 78 (06/01 0900) Resp:  [19-38] 21 (06/01 0900) BP: (99-203)/(66-103) 157/86 (06/01 0900) SpO2:  [92 %-99 %] 96 % (06/01 0900) Weight:  [113.9 kg] 113.9 kg (06/01 0500) Last BM Date: 05/08/19  Intake/Output from previous day: 05/31 0701 - 06/01 0700 In: 1402.7 [I.V.:149; IV Piggyback:1253.6] Out: 3502 [Urine:3500; Stool:2] Intake/Output this shift: Total I/O In: 131.6 [I.V.:21.2; IV Piggyback:110.4] Out: -   PE: Gen:  Alert, cooperative, sleepy HEENT: scleral icterus, PERRL Pulm:  Rate and effort normal, Holton in place Abd: Soft, obese, mild distention, normal BS, TTP in RUQ with mild guarding. No peritonitis  Skin: warm and dry   Anti-infectives: Anti-infectives (From admission, onward)   Start     Dose/Rate Route Frequency Ordered Stop   05/10/19 1600  vancomycin (VANCOCIN) 1,250 mg in sodium chloride 0.9 % 250 mL IVPB  Status:  Discontinued      1,250 mg 166.7 mL/hr over 90 Minutes Intravenous Every 24 hours 05/09/19 1453 05/09/19 1801   05/10/19 1400  ceFAZolin (ANCEF) IVPB 1 g/50 mL premix  Status:  Discontinued     1 g 100 mL/hr over 30 Minutes Intravenous Every 8 hours 05/10/19 0910 05/10/19 1017   05/10/19 1400  ceFAZolin (ANCEF) IVPB 2g/100 mL premix     2 g 200 mL/hr over 30 Minutes Intravenous Every 8 hours 05/10/19 1017 05/18/19 1359   05/08/19 1730  metroNIDAZOLE (FLAGYL) IVPB 500 mg     500 mg 100 mL/hr over 60 Minutes Intravenous Every 8 hours 05/08/19 1657 05/18/19 1729   05/08/19 1000  vancomycin (VANCOCIN) 1,750 mg in sodium chloride 0.9 % 500 mL IVPB  Status:  Discontinued     1,750 mg 250 mL/hr over 120 Minutes Intravenous Every 12 hours 05/07/19 2153 05/09/19 1453   05/08/19 0600  ceFEPIme (MAXIPIME) 2 g in sodium chloride 0.9 % 100 mL IVPB  Status:  Discontinued     2 g 200 mL/hr over 30 Minutes Intravenous Every 8 hours 05/07/19 2153 05/10/19 0910   05/07/19 2130  ceFEPIme (MAXIPIME) 2 g in sodium chloride 0.9 % 100 mL IVPB     2 g 200 mL/hr over 30 Minutes Intravenous  Once 05/07/19 2115 05/07/19 2232   05/07/19 2130  vancomycin (VANCOCIN) IVPB 1000 mg/200 mL premix  Status:  Discontinued     1,000 mg 200 mL/hr over 60 Minutes Intravenous  Once 05/07/19 2115 05/07/19  2120   05/07/19 2130  vancomycin (VANCOCIN) 1,500 mg in sodium chloride 0.9 % 500 mL IVPB     1,500 mg 250 mL/hr over 120 Minutes Intravenous  Once 05/07/19 2120 05/08/19 0040      Lab Results:  Recent Labs    05/09/19 0408 05/10/19 0441  WBC 8.9 7.2  HGB 13.0 12.9*  HCT 40.7 41.6  PLT 155 144*   BMET Recent Labs    05/09/19 0408 05/10/19 0441  NA 147* 152*  K 3.5 3.5  CL 110 117*  CO2 23 22  GLUCOSE 353* 239*  BUN 18 29*  CREATININE 1.33* 1.72*  CALCIUM 8.2* 8.4*   PT/INR Recent Labs    05/10/19 0441  LABPROT 16.4*  INR 1.3*   CMP     Component Value Date/Time   NA 152 (H) 05/10/2019 0441   NA 144 09/07/2018  0949   K 3.5 05/10/2019 0441   CL 117 (H) 05/10/2019 0441   CO2 22 05/10/2019 0441   GLUCOSE 239 (H) 05/10/2019 0441   BUN 29 (H) 05/10/2019 0441   BUN 22 09/07/2018 0949   CREATININE 1.72 (H) 05/10/2019 0441   CREATININE 0.72 04/18/2015 1140   CALCIUM 8.4 (L) 05/10/2019 0441   PROT 6.6 05/10/2019 0441   PROT 7.3 08/27/2017 1039   ALBUMIN 2.6 (L) 05/10/2019 0441   ALBUMIN 4.5 08/27/2017 1039   AST 230 (H) 05/10/2019 0441   ALT 358 (H) 05/10/2019 0441   ALKPHOS 84 05/10/2019 0441   BILITOT 4.1 (H) 05/10/2019 0441   BILITOT 0.2 08/27/2017 1039   GFRNONAA 43 (L) 05/10/2019 0441   GFRAA 49 (L) 05/10/2019 0441   Lipase     Component Value Date/Time   LIPASE 17 05/10/2019 0441    Studies/Results: Ct Head Wo Contrast  Result Date: 05/08/2019 CLINICAL DATA:  Altered mental status.  Combative. EXAM: CT HEAD WITHOUT CONTRAST TECHNIQUE: Contiguous axial images were obtained from the base of the skull through the vertex without intravenous contrast. COMPARISON:  None. FINDINGS: Brain: Motion degradation of the exam. Exam was repeated with minimal improvement. No gross evidence intracranial hemorrhage. No midline shift or mass effect. Difficult to exclude cortical infarction with the degree of motion. Basal cisterns are patent. Vascular: No hyperdense vessel or unexpected calcification. Skull: Normal. Negative for fracture or focal lesion. Sinuses/Orbits: Paranasal sinuses and mastoid air cells are clear. Orbits are clear. Other: None. IMPRESSION: Exam is extremely degraded by head motion. Exam was repeated with little improvement. No gross abnormality. Electronically Signed   By: Genevive Bi M.D.   On: 05/08/2019 19:34   Ct Abdomen Pelvis W Contrast  Result Date: 05/09/2019 CLINICAL DATA:  Fever.  Concern for pancreatitis EXAM: CT ABDOMEN AND PELVIS WITH CONTRAST TECHNIQUE: Multidetector CT imaging of the abdomen and pelvis was performed using the standard protocol following bolus  administration of intravenous contrast. CONTRAST:  OMNIPAQUE IOHEXOL 300 MG/ML  SOLN COMPARISON:  None. FINDINGS: Lower chest: Bilateral small pleural effusions. No infiltrate lung bases. Central venous congestion. Hepatobiliary: Normal hepatic parenchyma. The gallbladder wall is mildly thickened enhancing this is minimal finding with the gallbladder wall measuring 2 mm. There is pericholecystic fluid in the gallbladder fossa (image 38/3. Several small gallstones are present. The gallbladder bladder is not distended. Common bile duct is normal caliber. Pancreas: Pancreas is normal. No ductal dilatation. No pancreatic inflammation. Spleen: Stomach, small bowel, appendix, and cecum are normal. The colon and rectosigmoid colon are normal. Adrenals/urinary tract: Adrenal glands and kidneys are  normal. The ureters and bladder normal. Foley catheter in bladder. Stomach/Bowel: Stomach, small bowel, appendix, and cecum are normal. The colon and rectosigmoid colon are normal. Vascular/Lymphatic: Abdominal aorta is normal caliber with atherosclerotic calcification. There is no retroperitoneal or periportal lymphadenopathy. No pelvic lymphadenopathy. Reproductive: Prostate normal. Other: No free fluid. Musculoskeletal: No aggressive osseous lesion. IMPRESSION: 1. Cholelithiasis with gallbladder wall thickening and pericholecystic fluid is concerning for ACUTE CHOLECYSTITIS. No significant gallbladder distension. 2. Common bile duct is normal.  No evidence of pancreatitis. These results will be called to the ordering clinician or representative by the Radiologist Assistant, and communication documented in the PACS or zVision Dashboard. Electronically Signed   By: Genevive Bi M.D.   On: 05/09/2019 13:36   Dg Chest Port 1 View  Result Date: 05/09/2019 CLINICAL DATA:  Altered mental status and shortness of breath today. EXAM: PORTABLE CHEST 1 VIEW COMPARISON:  Single-view of the chest earlier today. FINDINGS:  Cardiomegaly and pulmonary edema persist without notable change. No pneumothorax or pleural effusion. No acute or focal bony abnormality. IMPRESSION: No change in findings most compatible with congestive heart failure. Electronically Signed   By: Drusilla Kanner M.D.   On: 05/09/2019 16:59   Dg Chest Port 1 View  Result Date: 05/09/2019 CLINICAL DATA:  Shortness of breath. EXAM: PORTABLE CHEST 1 VIEW COMPARISON:  05/08/2019 FINDINGS: Heart size remains stable. New diffuse interstitial and airspace opacities seen throughout both lungs, suspicious for diffuse pulmonary edema. No evidence of focal consolidation or definite pleural effusion. IMPRESSION: New diffuse interstitial and airspace opacities, likely due to diffuse pulmonary edema. Electronically Signed   By: Myles Rosenthal M.D.   On: 05/09/2019 03:40   Dg Abd Portable 1v  Result Date: 05/09/2019 CLINICAL DATA:  Abdominal distention. Shortness of breath. Congestive heart failure. EXAM: PORTABLE ABDOMEN - 1 VIEW COMPARISON:  None. FINDINGS: The bowel gas pattern is normal. No radio-opaque calculi or other significant radiographic abnormality are seen. IMPRESSION: Negative. Electronically Signed   By: Myles Rosenthal M.D.   On: 05/09/2019 03:40   [place] Ascites (abdomen Limited)  Result Date: 05/08/2019 CLINICAL DATA:  Abdominal distension. EXAM: LIMITED ABDOMEN ULTRASOUND FOR ASCITES TECHNIQUE: Limited ultrasound survey for ascites was performed in all four abdominal quadrants. COMPARISON:  None. FINDINGS: No ascites. IMPRESSION: No ascites. Electronically Signed   By: Gerome Sam III M.D   On: 05/08/2019 21:49   [place] Ekg Site Rite  Result Date: 05/09/2019 If Site Rite image not attached, placement could not be confirmed due to current cardiac rhythm.     Jerre Simon , [facility] Surgery 05/10/2019, 11:13 AM  Pager: [phone] Mon-Wed, Friday 7:00am-4:30pm Thurs 7am-11:30am  Consults: [phone]

## 2019-05-10 NOTE — Progress Notes (Signed)
Assisted tele visit to patient with wife.  Makinzi Prieur William, RN  

## 2019-05-10 NOTE — Progress Notes (Signed)
eLink Physician-Brief Progress Note Patient Name: Johnathan Keith DOB: Jan 30, 1961 MRN: [id number]   Date of Service  05/10/2019  HPI/Events of Note  Multiple issues: 1. Troponin = 0.15 @ 4:40 AM and 2. Frequent PVC's.  eICU Interventions  Will order: 1. Continue to cycle Troponin 2. ASA 81 mg PO now and Q day.  3. BMP and Mg++ level STAT.      Intervention Category Major Interventions: Arrhythmia - evaluation and management Intermediate Interventions: Diagnostic test evaluation  Sommer,Steven Eugene 05/10/2019, 9:47 PM

## 2019-05-10 NOTE — Progress Notes (Signed)
Daily Rounding Note  05/10/2019, 8:22 AM  LOS: 2 days   SUBJECTIVE:   Chief complaint: acute pancreatitis, suspect biliary.     Mental status improved today, speaking and better oriented.   Denies abd pain.  Wants water, mouth is dry, he is thirsty.    PICC line to be placed Temp to 103 today at 0030 Hypertensive to SBP 203, DBP to 103 but latest 130's/70s.  Tachy yesterday, today in 5s.  WBCs 17K >> 7.2.  Platelets drfifting 200s >> 144.     OBJECTIVE:         Vital signs in last 24 hours:    Temp:  [97.7 F (36.5 C)-103 F (39.4 C)] 97.9 F (36.6 C) (06/01 0700) Pulse Rate:  [79-104] 81 (06/01 0216) Resp:  [29-38] 29 (06/01 0216) BP: (99-203)/(66-103) 131/70 (06/01 0216) SpO2:  [95 %-99 %] 95 % (06/01 0216) Weight:  [113.9 kg] 113.9 kg (06/01 0500) Last BM Date: 05/08/19 Filed Weights   05/08/19 0059 05/09/19 0558 05/10/19 0500  Weight: 115.6 kg 108.4 kg 113.9 kg   General: obese, alert, looks ill.     Heart: RRR Chest: clear in front, no cough.  Some dyspnea Abdomen: obese, protuberant, NT.  BS hypoactive.    Extremities: no CCE.  Feet warm and dry.   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    Intake/Output from previous day: 05/31 0701 - 06/01 0700 In: 1402.7 [I.V.:149; IV Piggyback:1253.6] Out: 3502 [Urine:3500; Stool:2]  Intake/Output this shift: No intake/output data recorded.  Lab Results: Recent Labs    05/08/19 0513 05/09/19 0408 05/10/19 0441  WBC 17.8* 8.9 7.2  HGB 12.5* 13.0 12.9*  HCT 38.6* 40.7 41.6  PLT 204 155 144*   BMET Recent Labs    05/08/19 0513 05/09/19 0408 05/10/19 0441  NA 141 147* 152*  K 3.8 3.5 3.5  CL 104 110 117*  CO2 21* 23 22  GLUCOSE 395* 353* 239*  BUN 33* 18 29*  CREATININE 1.51* 1.33* 1.72*  CALCIUM 8.7* 8.2* 8.4*   LFT Recent Labs    05/08/19 0513 05/09/19 0408 05/10/19 0441  PROT 6.2* 6.6 6.6  ALBUMIN  3.0* 2.8* 2.6*  AST 532* 334* 230*  ALT 414* 460* 358*  ALKPHOS 75 87 84  BILITOT 3.5* 5.1* 4.1*   PT/INR Recent Labs    05/10/19 0441  LABPROT 16.4*  INR 1.3*   Hepatitis Panel No results for input(s): HEPBSAG, HCVAB, HEPAIGM, HEPBIGM in the last 72 hours.  Studies/Results: Ct Head Wo Contrast  Result Date: 05/08/2019 CLINICAL DATA:  Altered mental status.  Combative. EXAM: CT HEAD WITHOUT CONTRAST TECHNIQUE: Contiguous axial images were obtained from the base of the skull through the vertex without intravenous contrast. COMPARISON:  None. FINDINGS: Brain: Motion degradation of the exam. Exam was repeated with minimal improvement. No gross evidence intracranial hemorrhage. No midline shift or mass effect. Difficult to exclude cortical infarction with the degree of motion. Basal cisterns are patent. Vascular: No hyperdense vessel or unexpected calcification. Skull: Normal. Negative for fracture or focal lesion. Sinuses/Orbits: Paranasal sinuses and mastoid air cells are clear. Orbits are clear. Other: None. IMPRESSION: Exam is extremely degraded by head motion. Exam was repeated with little improvement. No gross abnormality. Electronically Signed   By: Genevive Bi M.D.   On: 05/08/2019 19:34   Ct Abdomen Pelvis W Contrast  Result Date: 05/09/2019 CLINICAL DATA:  Fever.  Concern for pancreatitis EXAM: CT ABDOMEN AND PELVIS WITH CONTRAST TECHNIQUE: Multidetector CT imaging of the abdomen and pelvis was performed using the standard protocol following bolus administration of intravenous contrast. CONTRAST:  OMNIPAQUE IOHEXOL 300 MG/ML  SOLN COMPARISON:  None. FINDINGS: Lower chest: Bilateral small pleural effusions. No infiltrate lung bases. Central venous congestion. Hepatobiliary: Normal hepatic parenchyma. The gallbladder wall is mildly thickened enhancing this is minimal finding with the gallbladder wall measuring 2 mm. There is pericholecystic fluid in the gallbladder fossa (image  38/3. Several small gallstones are present. The gallbladder bladder is not distended. Common bile duct is normal caliber. Pancreas: Pancreas is normal. No ductal dilatation. No pancreatic inflammation. Spleen: Stomach, small bowel, appendix, and cecum are normal. The colon and rectosigmoid colon are normal. Adrenals/urinary tract: Adrenal glands and kidneys are normal. The ureters and bladder normal. Foley catheter in bladder. Stomach/Bowel: Stomach, small bowel, appendix, and cecum are normal. The colon and rectosigmoid colon are normal. Vascular/Lymphatic: Abdominal aorta is normal caliber with atherosclerotic calcification. There is no retroperitoneal or periportal lymphadenopathy. No pelvic lymphadenopathy. Reproductive: Prostate normal. Other: No free fluid. Musculoskeletal: No aggressive osseous lesion. IMPRESSION: 1. Cholelithiasis with gallbladder wall thickening and pericholecystic fluid is concerning for ACUTE CHOLECYSTITIS. No significant gallbladder distension. 2. Common bile duct is normal.  No evidence of pancreatitis. These results will be called to the ordering clinician or representative by the Radiologist Assistant, and communication documented in the PACS or zVision Dashboard. Electronically Signed   By: Genevive Bi M.D.   On: 05/09/2019 13:36   Dg Chest Port 1 View  Result Date: 05/09/2019 CLINICAL DATA:  Altered mental status and shortness of breath today. EXAM: PORTABLE CHEST 1 VIEW COMPARISON:  Single-view of the chest earlier today. FINDINGS: Cardiomegaly and pulmonary edema persist without notable change. No pneumothorax or pleural effusion. No acute or focal bony abnormality. IMPRESSION: No change in findings most compatible with congestive heart failure. Electronically Signed   By: Drusilla Kanner M.D.   On: 05/09/2019 16:59   Dg Chest Port 1 View  Result Date: 05/09/2019 CLINICAL DATA:  Shortness of breath. EXAM: PORTABLE CHEST 1 VIEW COMPARISON:  05/08/2019 FINDINGS: Heart  size remains stable. New diffuse interstitial and airspace opacities seen throughout both lungs, suspicious for diffuse pulmonary edema. No evidence of focal consolidation or definite pleural effusion. IMPRESSION: New diffuse interstitial and airspace opacities, likely due to diffuse pulmonary edema. Electronically Signed   By: Myles Rosenthal M.D.   On: 05/09/2019 03:40   Dg Abd Acute 2+v W 1v Chest  Result Date: 05/08/2019 CLINICAL DATA:  Shortness of breath, abdominal distension EXAM: DG ABDOMEN ACUTE W/ 1V CHEST COMPARISON:  05/07/2019 FINDINGS: Lungs are clear.  No pleural effusion or pneumothorax. The heart is normal in size. Nonobstructive bowel gas pattern. No evidence of free air on the lateral decubitus view. Degenerative changes of the visualized thoracolumbar spine. IMPRESSION: No evidence of acute cardiopulmonary disease. No evidence of small bowel obstruction or free air. Electronically Signed   By: Charline Bills M.D.   On: 05/08/2019 11:18   Dg Abd Portable 1v  Result Date: 05/09/2019 CLINICAL DATA:  Abdominal distention. Shortness of breath. Congestive heart failure. EXAM: PORTABLE ABDOMEN - 1 VIEW COMPARISON:  None. FINDINGS: The bowel gas pattern is normal. No radio-opaque calculi or other significant radiographic abnormality are seen. IMPRESSION: Negative. Electronically Signed   By: Myles Rosenthal M.D.   On: 05/09/2019 03:40   [place] Ascites (abdomen Limited)  Result Date: 05/08/2019 CLINICAL DATA:  Abdominal distension. EXAM: LIMITED ABDOMEN ULTRASOUND FOR ASCITES TECHNIQUE: Limited ultrasound survey for ascites was performed in all four abdominal quadrants. COMPARISON:  None. FINDINGS: No ascites. IMPRESSION: No ascites. Electronically Signed   By: Gerome Sam III M.D   On: 05/08/2019 21:49   [place] Ekg Site Rite  Result Date: 05/09/2019 If Site Rite image not attached, placement could not be confirmed due to current cardiac rhythm.  Scheduled Meds:  bisacodyl  10 mg Rectal Q6H    Chlorhexidine Gluconate Cloth  6 each Topical Q0600   enoxaparin (LOVENOX) injection  30 mg Subcutaneous Q24H   insulin aspart  0-15 Units Subcutaneous TID WC   insulin aspart  0-5 Units Subcutaneous QHS   insulin detemir  20 Units Subcutaneous Daily   mouth rinse  15 mL Mouth Rinse BID   Continuous Infusions:  ceFEPime (MAXIPIME) IV 2 g (05/10/19 1610)   dexmedetomidine 0.4 mcg/kg/hr (05/10/19 0600)   metronidazole Stopped (05/10/19 0157)   PRN Meds:.albuterol, hydrALAZINE, ipratropium-albuterol, ondansetron **OR** ondansetron (ZOFRAN) IV  ASSESMENT:   *   Acute pancreatitis, likely biliary.  Sludge/? Stone in GB neck, pericholecystic fluid per u/s.     Cholelithiasis, no cholecystitis, normal CBD, no CBD stones per CT.  Surgeon Dr Dwain Sarna, not suspicious of cholecystitis.   Note a previous Rx fro Pancrease (pt not taking PTA).     *   Klebsiella (in 2/2 blood).  Sepsis.   ARDS.  Diffuse pulm edema per CXR.   Lactic acid levels normalized 4.2 >> 1.6.  Improving procalcitonin. Day 3 abx vanc (d/c'd ).  Days 3 Metronidazole, Cefepime   *   Subtle nodularity in liver: steatosis and/or cirrhosis, no ascites.   PT/INR 16.4/1.3.    *   AKI.  #s worse.    *   Troponin I elevation wo 0.15.   Right heart cardiac cath (per rec of pulmonologist) at [facility] on 5/26: "Normal right-sided pressures with normal pulmonary capillary wedge pressure" .     *    Hypernatremia.     *   Chronic Plavix for CAD, 2 previous CVAs (yet no prior CVA per current head CT).  Last dose not known, presume just PTA so likely 5/29.    *   AMS.  Unremarkable Head CT.  Suspect TME.     *   DM 2.  IDDM PTA.     PLAN   *   Supportive care.  Allow clear liquids if RN determines he is safe to swallow (kept NPO due to AMS, acute encephalopathy)    Johnathan Keith  05/10/2019, 8:22 AM Phone [phone]

## 2019-05-11 ENCOUNTER — Inpatient Hospital Stay (HOSPITAL_COMMUNITY): Payer: Medicare HMO

## 2019-05-11 ENCOUNTER — Encounter (HOSPITAL_COMMUNITY): Payer: Self-pay | Admitting: Interventional Radiology

## 2019-05-11 HISTORY — PX: IR PERC CHOLECYSTOSTOMY: IMG2326

## 2019-05-11 LAB — TROPONIN I
Troponin I: 0.08 ng/mL (ref <0.03)
Troponin I: 0.07 ng/mL (ref <0.03)

## 2019-05-11 LAB — GLUCOSE, CAPILLARY
Glucose-Capillary: 172 mg/dL — ABNORMAL HIGH (ref 70–99)
Glucose-Capillary: 150 mg/dL — ABNORMAL HIGH (ref 70–99)
Glucose-Capillary: 169 mg/dL — ABNORMAL HIGH (ref 70–99)
Glucose-Capillary: 164 mg/dL — ABNORMAL HIGH (ref 70–99)

## 2019-05-11 LAB — CBC
HCT: 40.8 % (ref 39.0–52.0)
Hemoglobin: 12.5 g/dL — ABNORMAL LOW (ref 13.0–17.0)
MCH: 28.9 pg (ref 26.0–34.0)
MCHC: 30.6 g/dL (ref 30.0–36.0)
MCV: 94.2 fL (ref 80.0–100.0)
Platelets: 166 10*3/uL (ref 150–400)
RBC: 4.33 MIL/uL (ref 4.22–5.81)
RDW: 17.3 % — ABNORMAL HIGH (ref 11.5–15.5)
WBC: 8.1 10*3/uL (ref 4.0–10.5)
nRBC: 0 % (ref 0.0–0.2)

## 2019-05-11 LAB — POCT I-STAT 7, (LYTES, BLD GAS, ICA,H+H)
Bicarbonate: 25.6 mmol/L (ref 20.0–28.0)
Calcium, Ion: 1.25 mmol/L (ref 1.15–1.40)
HCT: 35 % — ABNORMAL LOW (ref 39.0–52.0)
Hemoglobin: 11.9 g/dL — ABNORMAL LOW (ref 13.0–17.0)
O2 Saturation: 100 %
Patient temperature: 98.6
Potassium: 3.4 mmol/L — ABNORMAL LOW (ref 3.5–5.1)
Sodium: 163 mmol/L (ref 135–145)
TCO2: 27 mmol/L (ref 22–32)
pCO2 arterial: 42.6 mmHg (ref 32.0–48.0)
pH, Arterial: 7.387 (ref 7.350–7.450)
pO2, Arterial: 193 mmHg — ABNORMAL HIGH (ref 83.0–108.0)

## 2019-05-11 LAB — COMPREHENSIVE METABOLIC PANEL
ALT: 262 U/L — ABNORMAL HIGH (ref 0–44)
AST: 169 U/L — ABNORMAL HIGH (ref 15–41)
Albumin: 2.4 g/dL — ABNORMAL LOW (ref 3.5–5.0)
Alkaline Phosphatase: 85 U/L (ref 38–126)
Anion gap: 10 (ref 5–15)
BUN: 35 mg/dL — ABNORMAL HIGH (ref 6–20)
CO2: 25 mmol/L (ref 22–32)
Calcium: 8.2 mg/dL — ABNORMAL LOW (ref 8.9–10.3)
Chloride: 123 mmol/L — ABNORMAL HIGH (ref 98–111)
Creatinine, Ser: 1.65 mg/dL — ABNORMAL HIGH (ref 0.61–1.24)
GFR calc Af Amer: 52 mL/min — ABNORMAL LOW (ref >60)
GFR calc non Af Amer: 45 mL/min — ABNORMAL LOW (ref >60)
Glucose, Bld: 193 mg/dL — ABNORMAL HIGH (ref 70–99)
Potassium: 3.5 mmol/L (ref 3.5–5.1)
Sodium: 158 mmol/L — ABNORMAL HIGH (ref 135–145)
Total Bilirubin: 3.3 mg/dL — ABNORMAL HIGH (ref 0.3–1.2)
Total Protein: 6.2 g/dL — ABNORMAL LOW (ref 6.5–8.1)

## 2019-05-11 LAB — PROCALCITONIN: Procalcitonin: 4 ng/mL

## 2019-05-11 LAB — MAGNESIUM: Magnesium: 2.7 mg/dL — ABNORMAL HIGH (ref 1.7–2.4)

## 2019-05-11 IMAGING — US US RENAL
1 series · 14 of 23 positions shown · non-contrast
Comparison: CT from [DATE]

CLINICAL DATA: Renal failure

EXAM:
RENAL / URINARY TRACT ULTRASOUND COMPLETE

[Series 1: us renal · 14 of 23 slices shown]
[im 1/23]
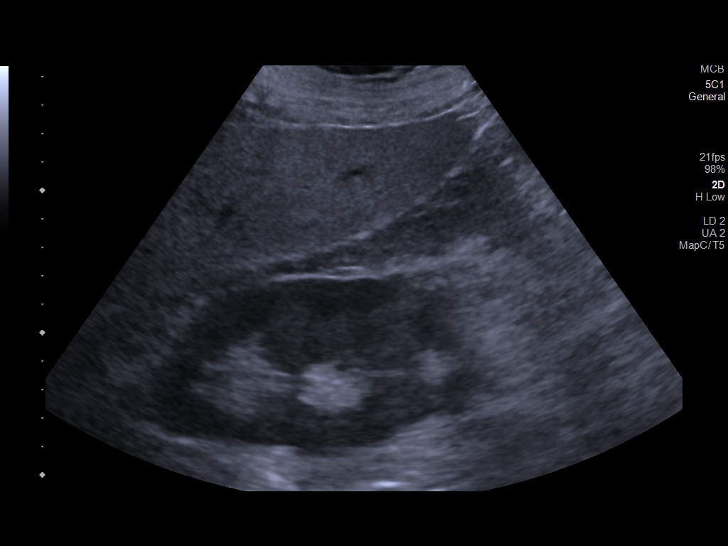
[im 3/23]
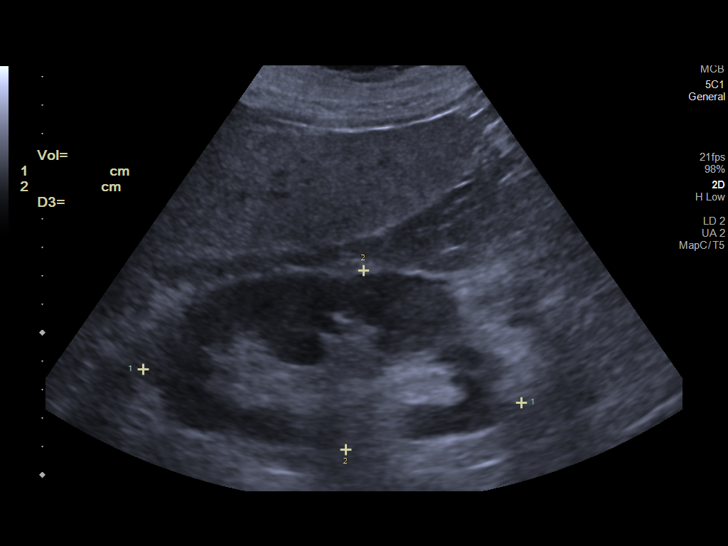
[im 5/23]
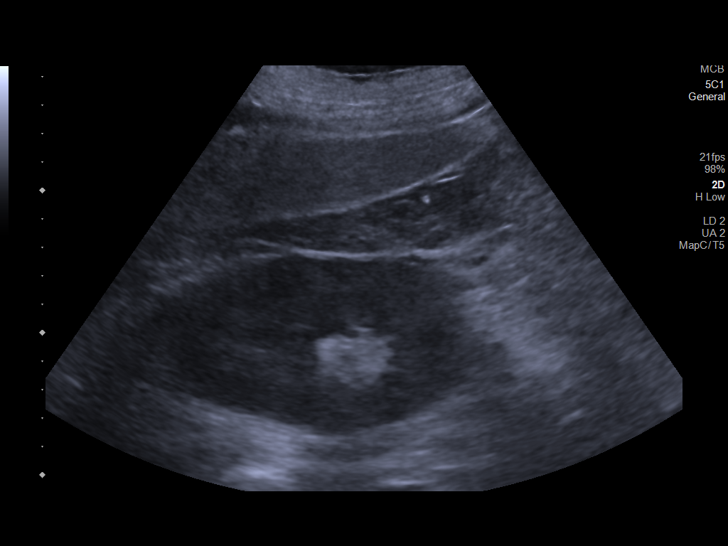
[im 6/23]
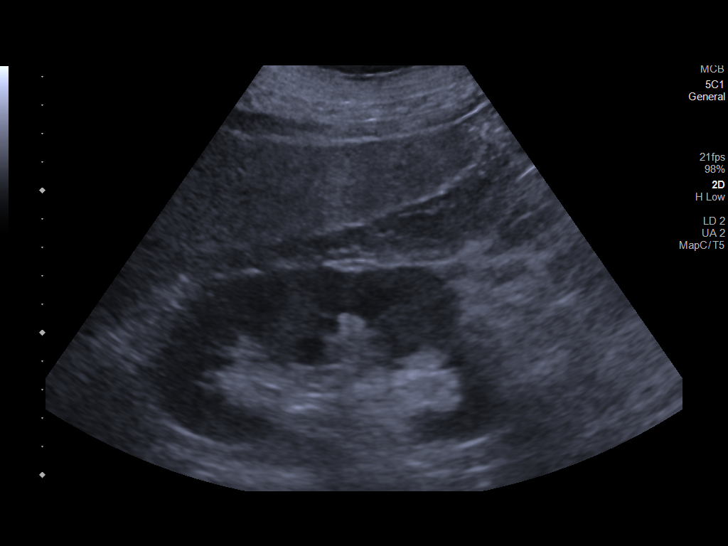
[im 8/23]
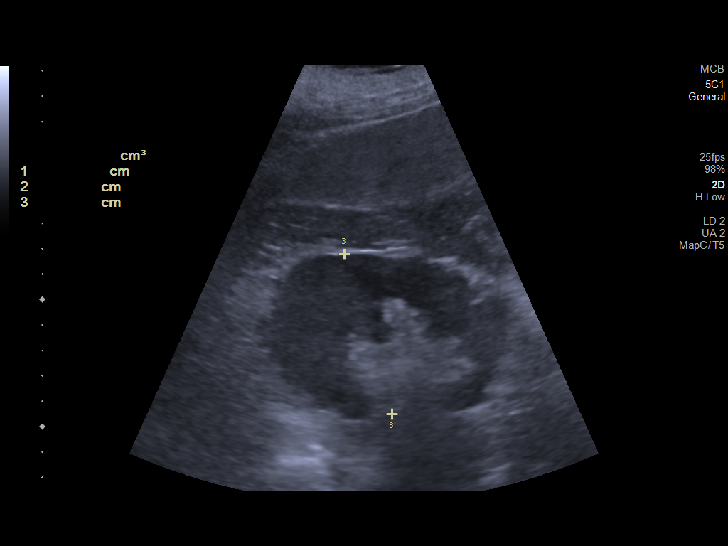
[im 10/23]
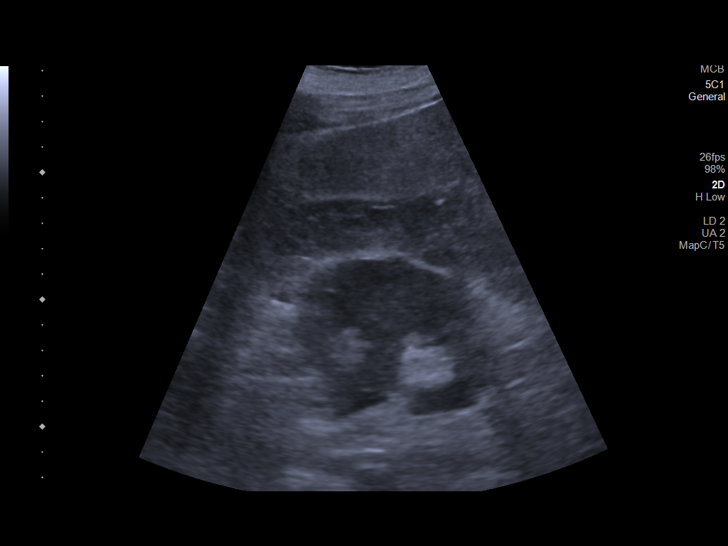
[im 11/23]
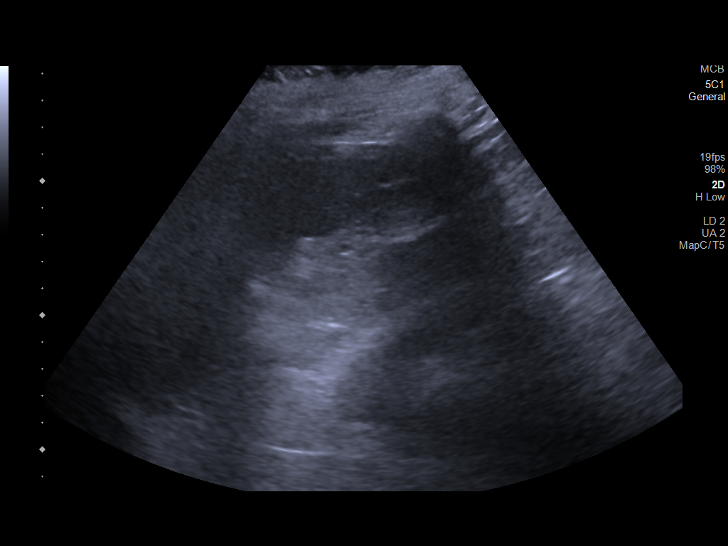
[im 13/23]
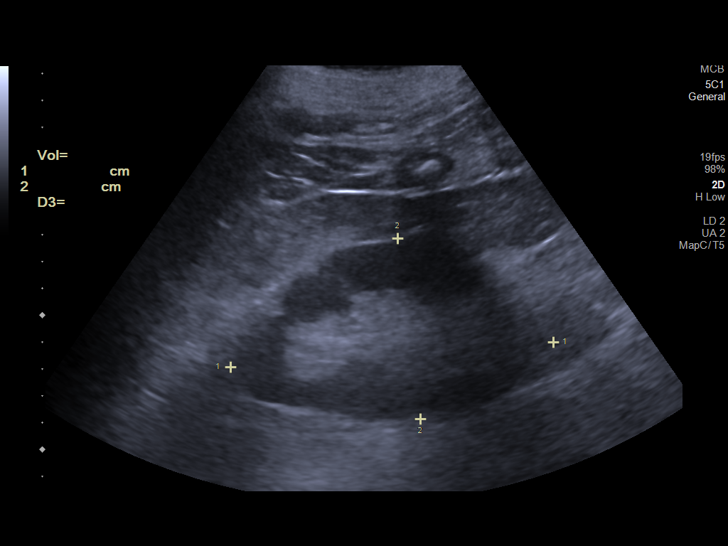
[im 14/23]
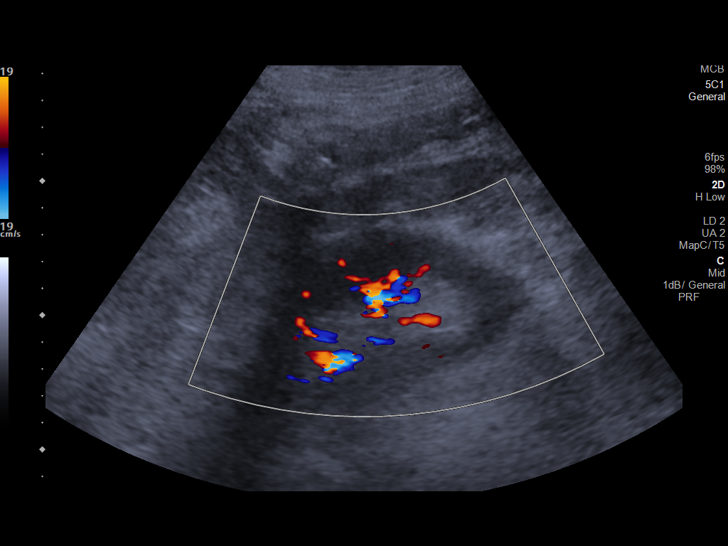
[im 16/23]
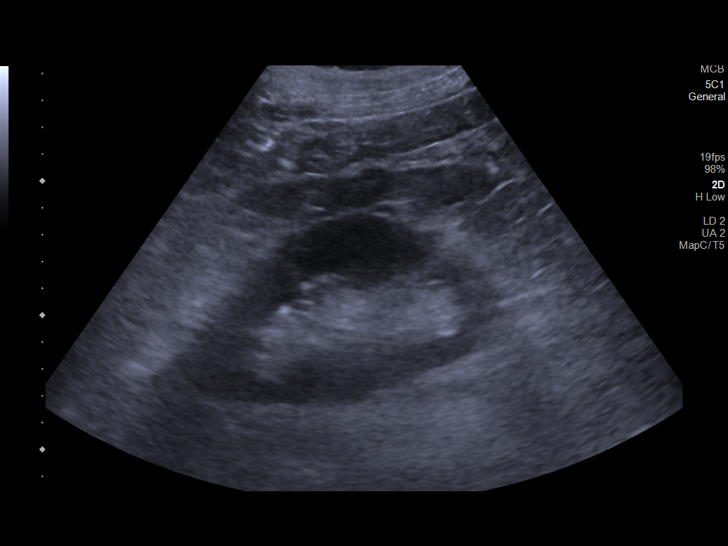
[im 18/23]
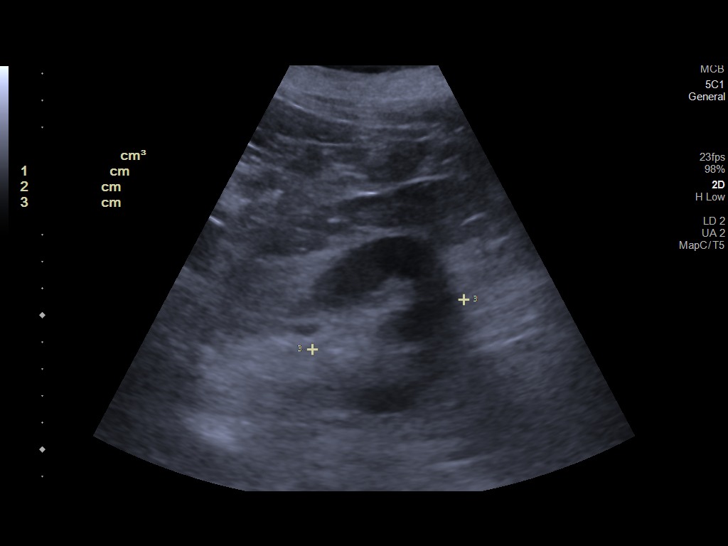
[im 19/23]
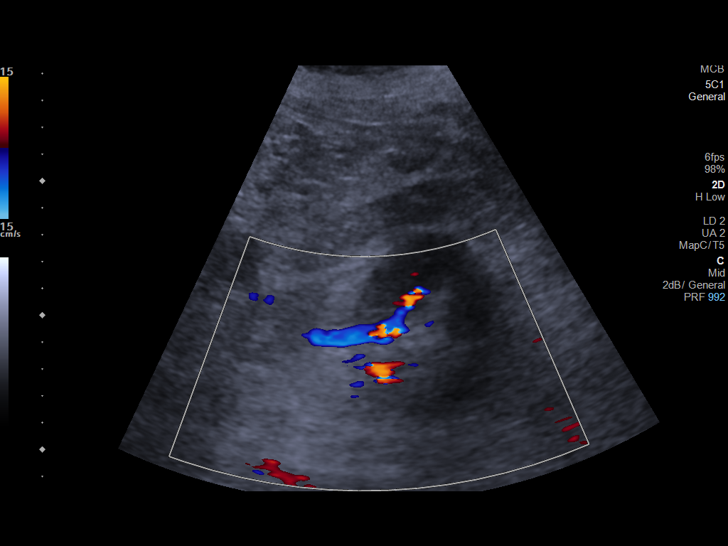
[im 21/23]
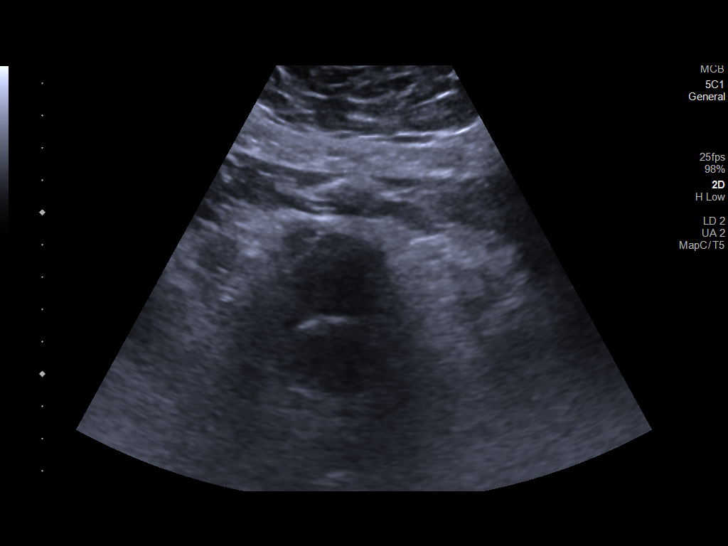
[im 23/23]
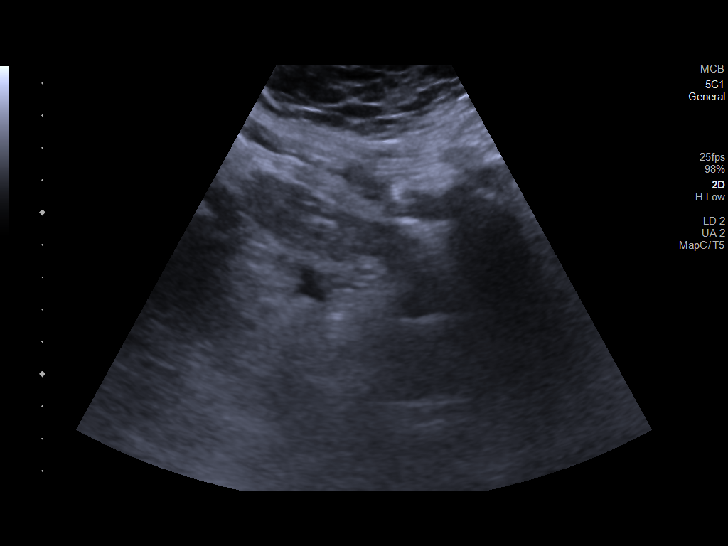

[14 of 23 positions shown; findings below may reference images not displayed]

FINDINGS: Right Kidney:

Renal measurements: 13.3 x 6.3 x 6.6 cm. = volume: 290 mL .
Echogenicity within normal limits. No mass or hydronephrosis
visualized.

Left Kidney:

Renal measurements: 12.1 x 6.8 x 5.9 cm = volume: 254 mL.
Echogenicity within normal limits. No mass or hydronephrosis
visualized.

Bladder:

Decompressed by Foley catheter.
IMPRESSION: Normal appearing kidneys bilaterally.

## 2019-05-11 IMAGING — XA IR CHOLANGIOGRAM PERCUTANEOUS TRANSHEPATIC
3 series · 11 of 11 positions shown · non-contrast
Comparison: none

INDICATION: Sepsis, cholecystitis

[Series 2: fl (-) angio · 1 of 1 slices shown (1 of 2)]
[im 1/1]
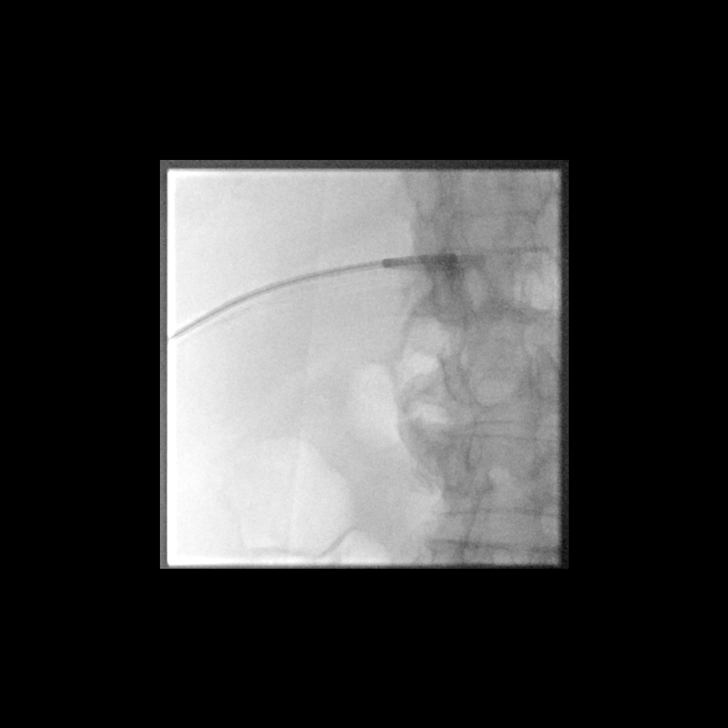

[Series 2: ir cholangiogram percutaneous transhepatic · 6 of 6 slices shown]
[im 1/6]
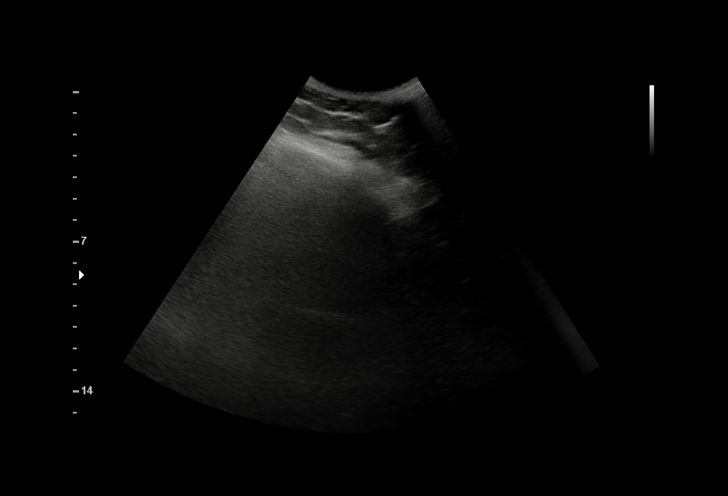
[im 2/6]
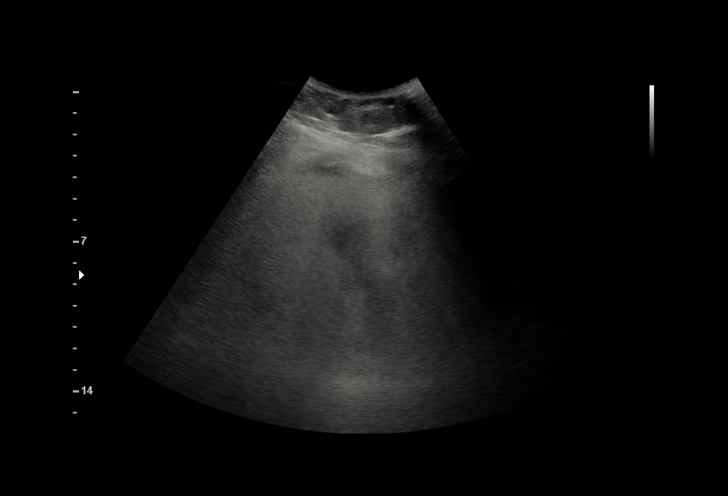
[im 3/6]
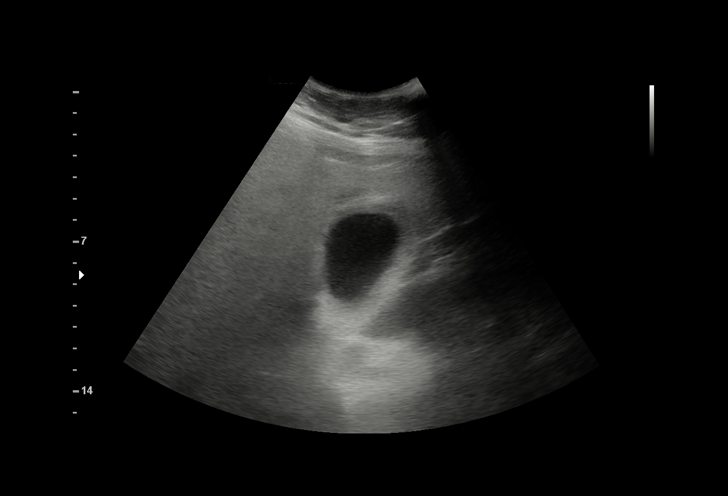
[im 4/6]
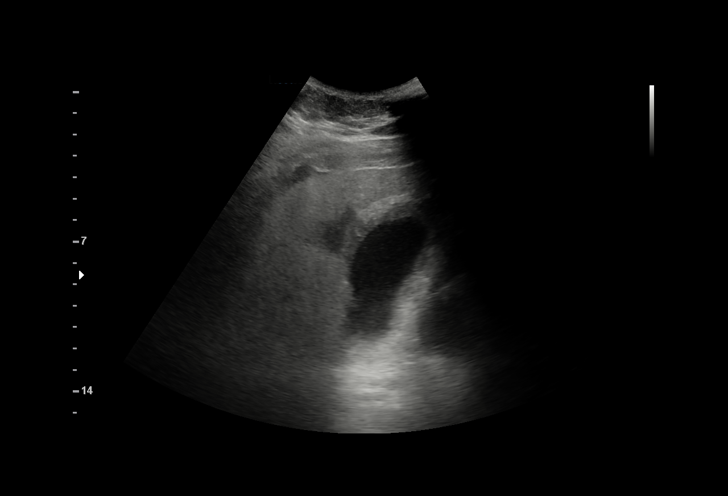
[im 5/6]
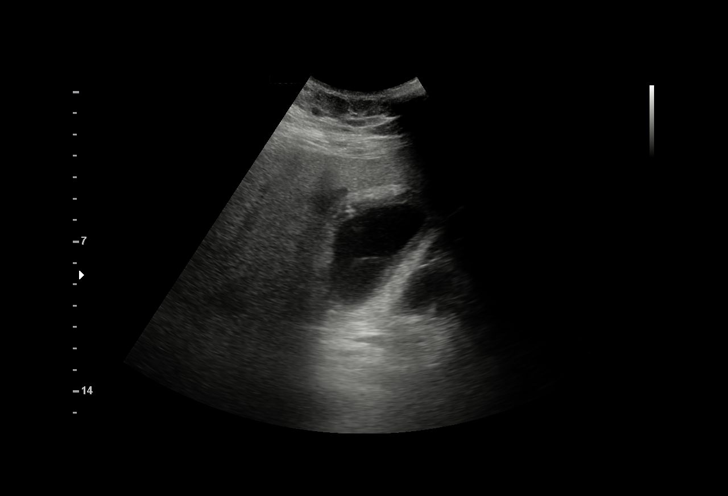
[im 6/6]
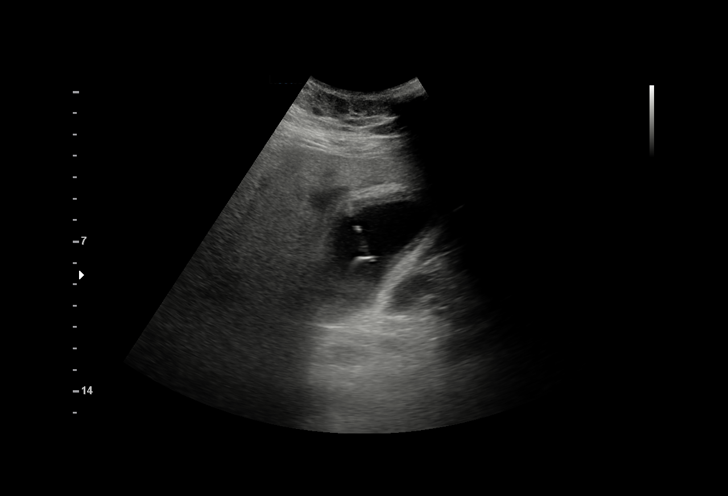

[Series 3: fl (-) angio · 4 of 4 slices shown (2 of 2)]
[im 1/4]
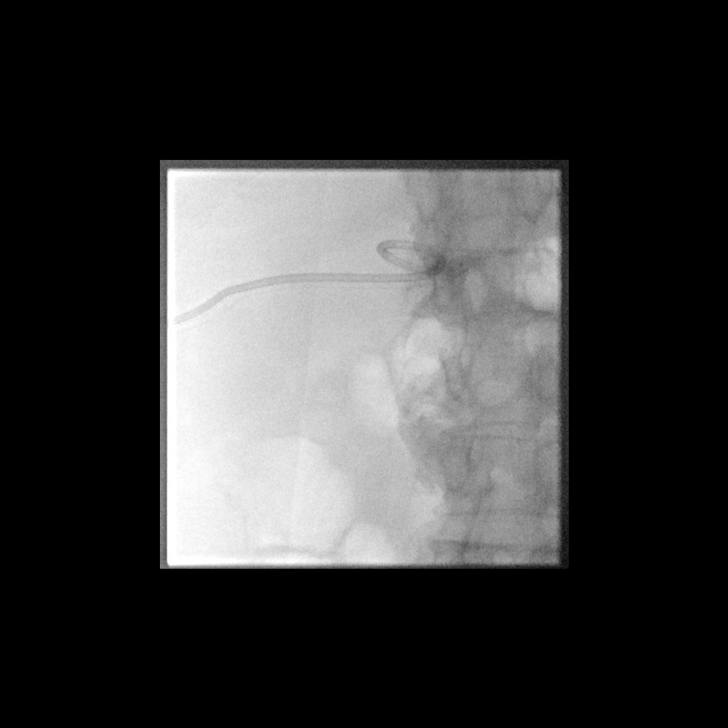
[im 2/4]
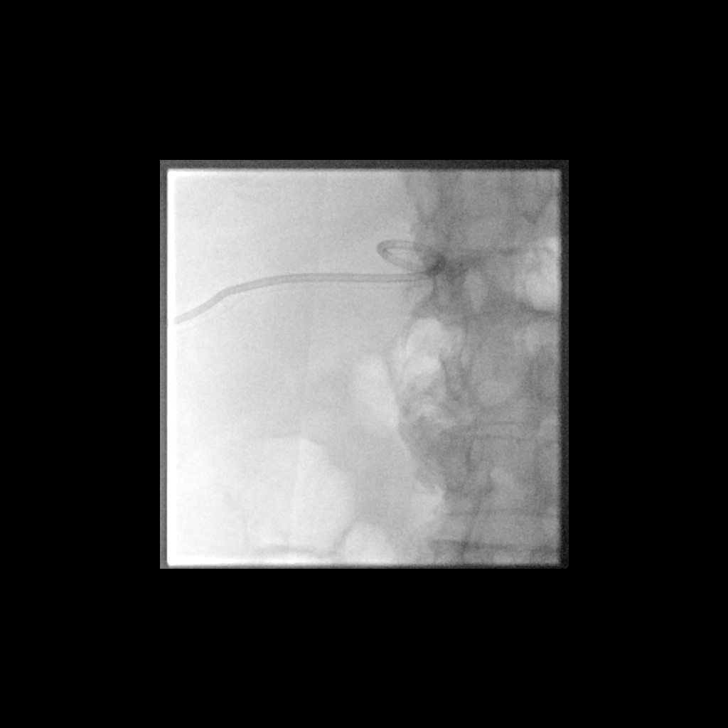
[im 3/4]
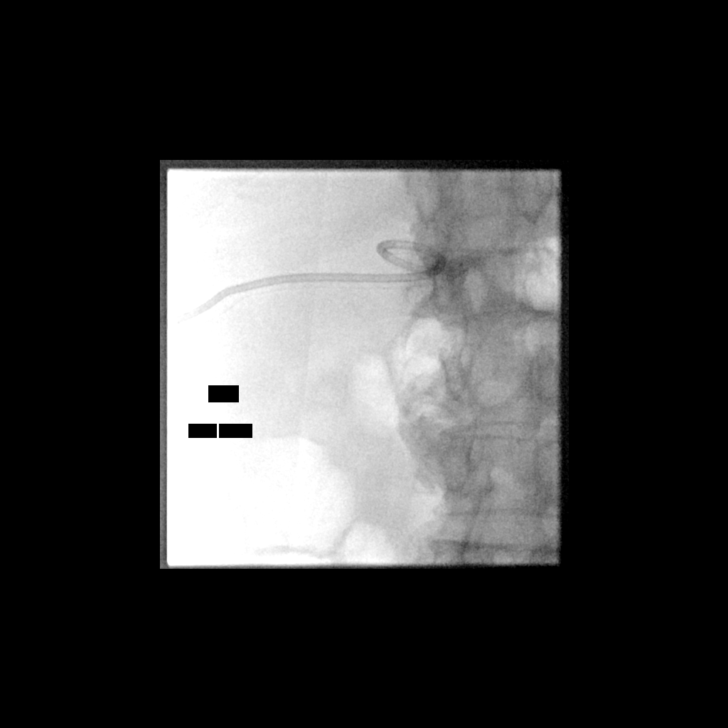
[im 4/4]
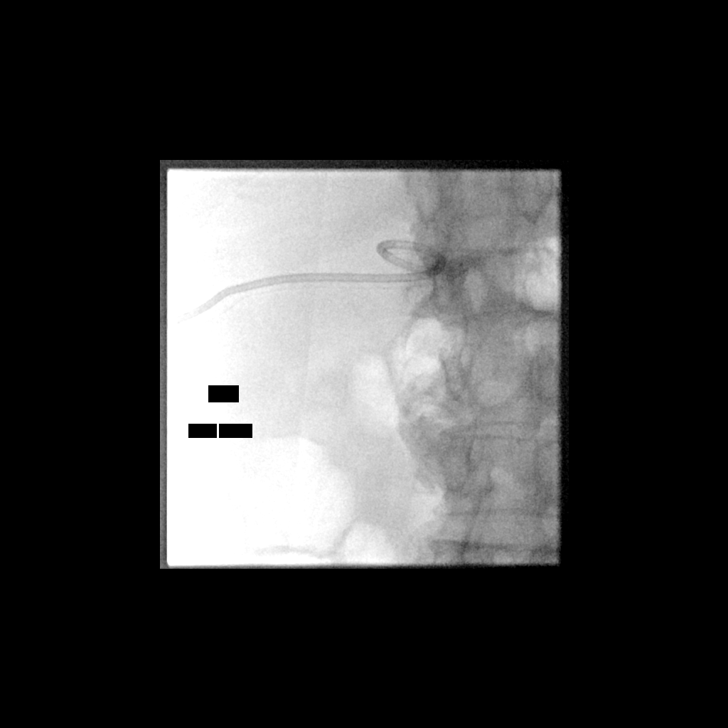

[11 of 11 positions shown; findings below may reference images not displayed]

EXAM:
ULTRASOUND FLUOROSCOPIC 10 FRENCH PERCUTANEOUS CHOLECYSTOSTOMY

MEDICATIONS:
Patient is already receiving IV antibiotics as an inpatient

ANESTHESIA/SEDATION:
100 mg Ketamine provided by the [REDACTED] staff

Moderate Sedation Time: None. The patient's level of consciousness
and vital signs were monitored continuously by radiology nursing
throughout the procedure under my direct supervision.

FLUOROSCOPY TIME:  Fluoroscopy Time: 0 minutes 42 seconds (28 mGy).

COMPLICATIONS:
None immediate.

PROCEDURE:
Informed written consent was obtained from the patient's family
after a thorough discussion of the procedural risks, benefits and
alternatives. All questions were addressed. Maximal Sterile Barrier
Technique was utilized including caps, mask, sterile gowns, sterile
gloves, sterile drape, hand hygiene and skin antiseptic. A timeout
was performed prior to the initiation of the procedure.

Preliminary ultrasound performed. The gallbladder was localized in
the right upper quadrant. This was correlated with the CT. Overlying
skin marked.

Under sterile conditions and local anesthesia, ultrasound
percutaneous transhepatic needle access performed with a 21 gauge
needle. Needle position confirmed with ultrasound. Images obtained
for documentation for ultrasound access.

There was return of green bile. Sample sent for culture. Guidewire
inserted followed by the Accustick dilator set. Amplatz guidewire
inserted followed by tract dilatation to insert a 10 French drain.
Drain catheter position confirmed with ultrasound and fluoroscopy.
Images obtained for documentation. 30 cc bile aspirated. Catheter
connected to external gravity drainage bag. Catheter secured with
Prolene suture and a sterile dressing. No immediate complication.
Patient tolerated the procedure well.
IMPRESSION: Successful ultrasound fluoroscopic 10 French percutaneous
transhepatic cholecystostomy. Bile culture sent.

## 2019-05-11 MED ORDER — LIDOCAINE HCL 1 % IJ SOLN
INTRAMUSCULAR | Status: AC | PRN
  Administered 2019-05-11: 10 mL

## 2019-05-11 MED ORDER — KETAMINE HCL-SODIUM CHLORIDE 100-0.9 MG/10ML-% IV SOSY
1.0000 mg/kg | PREFILLED_SYRINGE | Freq: Once | INTRAVENOUS | Status: AC
  Administered 2019-05-11: 100 mg via INTRAVENOUS
  Filled 2019-05-11 (×4): qty 10

## 2019-05-11 MED ORDER — LIDOCAINE HCL 1 % IJ SOLN
INTRAMUSCULAR | Status: AC
  Filled 2019-05-11: qty 20

## 2019-05-11 MED ORDER — IOHEXOL 300 MG/ML  SOLN
50.0000 mL | Freq: Once | INTRAMUSCULAR | Status: AC | PRN
  Administered 2019-05-11: 15 mL via INTRAVENOUS

## 2019-05-11 MED ORDER — HYDRALAZINE HCL 20 MG/ML IJ SOLN
INTRAMUSCULAR | Status: AC
  Administered 2019-05-11: 15:00:00
  Filled 2019-05-11: qty 1

## 2019-05-11 MED ORDER — HYDRALAZINE HCL 20 MG/ML IJ SOLN
INTRAMUSCULAR | Status: AC | PRN
  Administered 2019-05-11: 20 mg via INTRAVENOUS
  Administered 2019-05-11: 15:00:00

## 2019-05-11 MED ORDER — DEXTROSE-NACL 5-0.45 % IV SOLN
INTRAVENOUS | Status: DC
  Administered 2019-05-11 – 2019-05-12 (×2): via INTRAVENOUS

## 2019-05-11 MED ORDER — ENOXAPARIN SODIUM 40 MG/0.4ML ~~LOC~~ SOLN
40.0000 mg | SUBCUTANEOUS | Status: DC
  Administered 2019-05-12 – 2019-05-16 (×5): 40 mg via SUBCUTANEOUS
  Filled 2019-05-11 (×6): qty 0.4

## 2019-05-11 MED ORDER — ORAL CARE MOUTH RINSE
15.0000 mL | Freq: Two times a day (BID) | OROMUCOSAL | Status: DC
  Administered 2019-05-12 – 2019-05-16 (×9): 15 mL via OROMUCOSAL

## 2019-05-11 MED ORDER — CHLORHEXIDINE GLUCONATE 0.12 % MT SOLN
15.0000 mL | Freq: Two times a day (BID) | OROMUCOSAL | Status: DC
  Administered 2019-05-11 – 2019-05-17 (×12): 15 mL via OROMUCOSAL
  Filled 2019-05-11 (×10): qty 15

## 2019-05-11 MED ORDER — SODIUM CHLORIDE 0.9% FLUSH
5.0000 mL | Freq: Three times a day (TID) | INTRAVENOUS | Status: DC
  Administered 2019-05-12 – 2019-05-16 (×8): 5 mL

## 2019-05-11 MED ORDER — KETAMINE HCL-SODIUM CHLORIDE 20-0.9 MG/2ML-% IV SOSY
PREFILLED_SYRINGE | INTRAVENOUS | Status: AC | PRN
  Administered 2019-05-11: 100 mg via INTRAVENOUS

## 2019-05-11 MED FILL — Ketamine HCl-NaCl Soln Pref Sy 20 MG/2ML-0.9% (10MG/ML): INTRAVENOUS | Qty: 2 | Status: AC

## 2019-05-11 NOTE — Progress Notes (Signed)
Called [facility] for request for bilateral wrist restraints. Pt constantly pulling at NRB mask that causes desaturations to the low 80's. Pt also starting to pull at newly placed IR drain. Bilateral mitts already on and RN is providing constant verbal contact with redirection and reorientation.

## 2019-05-11 NOTE — Sedation Documentation (Signed)
Patient on NRB unable to monitor ETCO2. CCM MD to push narcotics on patient. Patient on Precedex.

## 2019-05-11 NOTE — Progress Notes (Addendum)
NAME:  Johnathan Keith, MRN:  [id number], DOB:  12-29-1960, LOS: 3 ADMISSION DATE:  05/07/2019, CONSULTATION DATE:  05/09/2019 REFERRING MD:  Dr [name] Chimes, CHIEF COMPLAINT:  Acute encephalopathy, SIRs, gallstone pancreatitis   Brief History   See belo  History of present illness   58 year old obese male with acid reflux, hypertension, coronary artery disease and diabetes.  He arrived in the emergency department May 07, 2019 because of fever, chills, generalized weakness and confusion associated with nausea and vomiting.  Initially fever was 100.3 and tachycardic.  His initial lactic acid was 3.9.  His COVID-19 test was negative.  According to the bedside nurse for most of the day on May 08, 2019 he had hypoactive delirium and significant sirs physiology.  However today they feel he is somewhat improved with improved AKI and resolved lactic acidosis and improving LFT and HR and RR.  However, because of his agitated delirium  has been difficult to control and MEWS score is 6. Therefore critical care medicine has been consulted.  Evaluation is shown that GI and [facility] [place] [facility] have consulted on him and the unifying diagnosis is 1 of gallstone pancreatitis [admission diagnosis based on chemical lipase elevation of 1276] without evidence of acute cholecystitis according to consultation by the 2 clinicians.  [his CT scan did not show any pancreatitis but suggested he had cholecystitis].  He has a small stone in the gallbladder neck without any biliary dilatation.  Aggressive ICU medical management has been recommended.  Growing klebisella in blood    Significant Hospital Events   05/07/2019 0 admit 5/30- GI consult 5/31 - CCS consult 5/31 - ccm consult and move to ICU.  CT scan IMPRESSION: 1. Cholelithiasis with gallbladder wall thickening and pericholecystic fluid is concerning for ACUTE CHOLECYSTITIS. No significant gallbladder distension. 2. Common bile duct is normal.  No evidence of  pancreatitis.  Consults:  5/30- GI consult 5/31 - CCS consult 5/31 - ccm consult and move to ICU  Procedures:  N/A  Significant Diagnostic Tests:  May 07, 2019 right upper quadrant ultrasound IMPRESSION: 1. 9 mm stone versus tumefactive sludge within the gallbladder neck. No sonographic features to suggest acute cholecystitis. 2. No biliary dilatation. 3. Increased echogenicity within the hepatic parenchyma with question of subtle nodularity of the hepatic contour, which could reflect sequelae of cirrhosis and/or steatosis.   May 09, 2019 CT scan abdomen - IMPRESSION: 1. Cholelithiasis with gallbladder wall thickening and pericholecystic fluid is concerning for ACUTE CHOLECYSTITIS. No significant gallbladder distension. 2. Common bile duct is normal.  No evidence of pancreatitis.  Micro Data:   Blood culture May 07, 2019 -growing Klebsiella pneumoniae COVID-19 and respiratory virus panel May 07, 2019-negative  Antimicrobials:  Vancomycin -May 30 Cefepime May 29 Flagyl -May 30  Interim history/subjective:  Denies abdominal pain. Thirsty but not hungry.  Objective   Blood pressure (!) 147/70, pulse 76, temperature (!) 100.9 F (38.3 C), temperature source Axillary, resp. rate (!) 27, height 5\' 6"  (1.676 m), weight 110.8 kg, SpO2 98 %.        Intake/Output Summary (Last 24 hours) at 05/11/2019 0814 Last data filed at 05/11/2019 0600 Gross per 24 hour  Intake 1910.19 ml  Output 1500 ml  Net 410.19 ml   Filed Weights   05/09/19 0558 05/10/19 0500 05/11/19 0416  Weight: 108.4 kg 113.9 kg 110.8 kg    Examination: General: Morbidly obese male currently on a Precedex drip HEENT: No JVD or lymphadenopathy is appreciated Neuro: Arouses with strong  verbal stimulation but does not follow commands CV: Sounds regular regular rate rhythm PULM: even/non-labored, lungs bilaterally shallow respirations GI: Distended but remarkably nontender faint bowel sounds Extremities:  warm/dry, 1+ edema  Skin: no rashes or lesions   LABS    PULMONARY Recent Labs  Lab 05/07/19 2104 05/08/19 1307 05/09/19 0335 05/09/19 1638  PHART 7.437 7.455* 7.380 7.412  PCO2ART 37.0 34.6 43.3 39.6  PO2ART 78.0* 71.8* 59.1* 69.2*  HCO3 25.0 24.0 24.3 24.7  TCO2 26  --   --   --   O2SAT 96.0 92.9 84.3 92.4    CBC Recent Labs  Lab 05/09/19 0408 05/10/19 0441 05/11/19 0347  HGB 13.0 12.9* 12.5*  HCT 40.7 41.6 40.8  WBC 8.9 7.2 8.1  PLT 155 144* 166    COAGULATION Recent Labs  Lab 05/10/19 0441  INR 1.3*    CARDIAC   Recent Labs  Lab 05/09/19 1830 05/10/19 0441 05/10/19 2145 05/11/19 0347  TROPONINI 0.11* 0.15* 0.07* 0.08*   No results for input(s): PROBNP in the last 168 hours.   CHEMISTRY Recent Labs  Lab 05/08/19 0513 05/09/19 0408 05/10/19 0441 05/10/19 2145 05/11/19 0347  NA 141 147* 152* 154* 158*  K 3.8 3.5 3.5 3.5 3.5  CL 104 110 117* 123* 123*  CO2 21* GLUCOSE 395* 353* 239* 235* 193*  BUN 33* 18 29* 35* 35*  CREATININE 1.51* 1.33* 1.72* 1.52* 1.65*  CALCIUM 8.7* 8.2* 8.4* 7.8* 8.2*  MG  --  1.7 2.8* 2.6* 2.7*   Estimated Creatinine Clearance: 56.3 mL/min (A) (by C-G formula based on SCr of 1.65 mg/dL (H)).   LIVER Recent Labs  Lab 05/07/19 2052 05/08/19 0513 05/09/19 0408 05/10/19 0441 05/11/19 0347  AST 309* 532* 334* 230* 169*  ALT 194* 414* 460* 358* 262*  ALKPHOS 86 75 87 84 85  BILITOT 2.3* 3.5* 5.1* 4.1* 3.3*  PROT 6.3* 6.2* 6.6 6.6 6.2*  ALBUMIN 3.3* 3.0* 2.8* 2.6* 2.4*  INR  --   --   --  1.3*  --      INFECTIOUS Recent Labs  Lab 05/08/19 1435 05/09/19 0408 05/09/19 0415 05/10/19 0441 05/11/19 0347  LATICACIDVEN 3.1*  --  1.9 1.6  --   PROCALCITON  --  7.98  --  6.28 4.00     ENDOCRINE CBG (last 3)  Recent Labs    05/10/19 1553 05/10/19 2159 05/11/19 0740  GLUCAP 236* 223* 172[facility] Problem list   N/A  Assessment & Plan:   Critically ill due to agitated  delirium requiring titration of dexmedetomidine to achieve adequate control of agitation to prevent self-harm. Currently NPO, limits options for sedative medications. Plan: Currently on Precedex Noted to be slightly more somnolent on 05/11/2019 Careful with sedation  Critically ill due to possible airway compromise due to body habitus and abdominal distention and possible undiagnosed OSA. At high risk for oversedation. Remains on high-flow O2 Plan: Careful with sedatives and suspected OSA Monitor O2 saturation although there would not preclude CO2 retention  K.pneumoniae bacteremia - likely from cholangitis related to biliary obstruction.  Currently no signs of obstruction requiring intervention. Stone has passed.  Plan: Continue antimicrobial therapies treated for 10 days  Acute gallstone (microlithiasis) related pancreatitis. Lipase had normalized No indication for surgical intervention. Plan: Advance diet when more awake   Hyperglycemia Hyponatremia CBG (last 3)  Recent Labs    05/10/19 1553 05/10/19 2159 05/11/19 0740  GLUCAP 236* 223* 172*  Recent Labs  Lab 05/10/19 0441 05/10/19 2145 05/11/19 0347  NA 152* 154* 158*   Plan: Change IV fluids to D5 and half 75 cc an hour Add low-dose Levemir for rising glucose  Prior CAD Mild demand related troponin rise Plan: Continue to monitor  AKI  Lab Results  Component Value Date   CREATININE 1.65 (H) 05/11/2019   CREATININE 1.52 (H) 05/10/2019   CREATININE 1.72 (H) 05/10/2019   CREATININE 0.72 04/18/2015   CREATININE 0.81 03/22/2015    No indication for HD Plan: Monitor renal function IV fluids as needed Check renal ultrasound 05/11/2019 for completeness    Best practice:  Diet: Clear liquids Pain/Anxiety/Delirium protocol (if indicated): off precedex. PRN haloperidol  VAP protocol (if indicated): HOB > 30 deg DVT prophylaxis: lovenox GI prophylaxis: not intubated Glucose control: on basal/bolus  insulin. Increased Levemir . Mobility: bed rest Code Status: full Family Communication:  Per [facility] - Dr [name] Chimes will update 05/11/2019 Disposition: Monitor here in ICU today prior to transfer back to stepdown tomorrow.    CRITICAL CARE App CCT 30 min    Brett Canales [place] Hijazi ACNP [facility] PCCM Pager [phone] till 1 pm If no answer page [phone] 05/11/2019, 8:15 AM

## 2019-05-11 NOTE — Progress Notes (Signed)
Called and spoke to [facility] about concern over increasing O2 requirements and  periods of apnea then agitation with altered mental status that is waxing and waning.  Pt now on non rebreather with diminished breath sounds through out and tachypnea.  Order for stat ABG. Will continue to monitor.

## 2019-05-11 NOTE — Progress Notes (Signed)
eLink Physician-Brief Progress Note Patient Name: Trotter Remson DOB: [birth year] MRN: [id number]   Date of Service  05/11/2019  HPI/Events of Note  Agitation - Patient pulling at new drain. Request for bilateral soft wrist restraints.   eICU Interventions  Will order bilateral soft wrist restraints.      Intervention Category Major Interventions: Delirium, psychosis, severe agitation - evaluation and management  Namiko Pritts [place] 05/11/2019, 10:33 PM

## 2019-05-11 NOTE — Consult Note (Signed)
Chief Complaint: Patient was seen in consultation today for percutaneous cholecystostomy drain placement Chief Complaint  Patient presents with   Altered Mental Status   at the request of Dr Fredonia HighlandE Wilson   Supervising Physician: Ruel FavorsShick, Trevor  [facility]  History of Present Illness: Johnathan Keith is a 58 y.o. male   HTN; GERD; CAD; DM To ED fever/chills and weakness Confusion; N/V Bacteremia  CT 5/31:  IMPRESSION: 1. Cholelithiasis with gallbladder wall thickening and pericholecystic fluid is concerning for ACUTE CHOLECYSTITIS. No significant gallbladder distension. 2. Common bile duct is normal.  No evidence of pancreatitis.  Note of Dr Andrey CampanileWilson today: He definitely had a case of GS pancreatitis. Don't think his duct is obstructed since LFTs still trending down but I'm concerned there may be a component of cholecystitis. There are some things that go against cholecystitis (nml wbc, not significant wall thickness on imaging) however the pt's physical exam is c/w cholecystitis. There was no radiological evidence of pancreatitis on CT.   I think we need to proceed with cholecystostomy tube placement.   CCS feels pt not a candidate for surgery-- AMS and bacteremia Asking IR to place Cholecystostomy drain Imaging reviewed with Dr Madelaine EtienneShick Approves chole drain placement  Past Medical History:  Diagnosis Date   Chronic neck and back pain    Coronary artery disease    drug-eluting stents placed in proximal and mid LAD   Deviated septum    Diabetic retinopathy (HCC)    GERD (gastroesophageal reflux disease)    "occasionally" (03/23/2015)   Headache    "more than 2/wk" (03/23/2015)   Heart attack (HCC)    "in 1997 was told I had signs of a small heart attack that I didn't know I'd had"   High cholesterol    History of gout    "when I was younger"   Hypertension    Kidney stones    Migraine    "had my 1st and only in 12/2014" (03/23/2015)    Peripheral vascular disease (HCC)    carotid artery disease   PONV (postoperative nausea and vomiting)    "was told I was confused and combative in recovery from the narcotics w/my carotid OR"   Sleep apnea    "severe; couldn't afford mask" (03/23/2015)   Stroke (HCC) 2014   "on walker for 2 months; left side is weaker and numb since" (03/23/2015)   Type II diabetes mellitus (HCC)     Past Surgical History:  Procedure Laterality Date   CIRCUMCISION  1981   CORONARY ANGIOPLASTY WITH STENT PLACEMENT  03/23/2015   "2"   CORONARY STENT INTERVENTION N/A 09/29/2017   Procedure: CORONARY STENT INTERVENTION;  Surgeon: Runell GessBerry, Jonathan J, MD;  Location: [facility];  Service: Cardiovascular;  Laterality: N/A;   ENDARTERECTOMY Right 09/26/2014   Procedure: RIGHT CAROTID ENDARTERECTOMY WITH PATCH ANGIOPLASTY;  Surgeon: Fransisco HertzBrian L Chen, MD;  [facility];  Service: Vascular;  Laterality: Right;   EYE SURGERY Right May 2016   Cataract   EYE SURGERY Left July 2016   Cataract   HYDROCELE EXCISION  ~ 2008   LEFT HEART CATH AND CORONARY ANGIOGRAPHY N/A 09/29/2017   Procedure: LEFT HEART CATH AND CORONARY ANGIOGRAPHY;  Surgeon: Runell GessBerry, Jonathan J, MD;  Location: [facility] CV [facility];  Service: Cardiovascular;  Laterality: N/A;   LEFT HEART CATHETERIZATION WITH CORONARY ANGIOGRAM N/A 03/23/2015   Procedure: LEFT HEART CATHETERIZATION WITH CORONARY ANGIOGRAM;  Surgeon: Runell GessJonathan J Berry, MD;  Location:  [facility] CATH LAB;  Service: Cardiovascular;  Laterality: N/A;   REFRACTIVE SURGERY Bilateral 08/2014-09/2014   Diabetic retinopathy     Allergies: Patient has no known allergies.  Medications: Prior to Admission medications   Medication Sig Start Date End Date Taking? Authorizing Provider  albuterol (PROVENTIL HFA;VENTOLIN HFA) 108 (90 Base) MCG/ACT inhaler Inhale 1 puff into the lungs every 6 (six) hours as needed for wheezing or shortness of breath.   Yes [provider]  aspirin  81 MG tablet Take 1 tablet (81 mg total) by mouth daily. 07/21/18  Yes George Hugh, NP  Cholecalciferol (VITAMIN D) 2000 UNITS tablet Take 2,000 Units by mouth daily.   Yes [provider]  clopidogrel (PLAVIX) 75 MG tablet Take 1 tablet by mouth once daily 05/04/19  Yes Runell Gess, MD  cyclobenzaprine (FLEXERIL) 5 MG tablet Take 5 mg by mouth daily as needed for muscle spasms.    Yes [provider]  dicyclomine (BENTYL) 10 MG capsule TAKE 1 CAPSULE BY MOUTH 4 TIMES DAILY Patient taking differently: Take 10 mg by mouth 4 (four) times daily -  before meals and at bedtime. Taking once daily in the AM 12/21/18  Yes Lynann Bologna, MD  fluocinonide ointment (LIDEX) 0.05 % Apply 1 application topically as needed. Apply to affected areas for infection 03/23/19  Yes [provider]  gabapentin (NEURONTIN) 100 MG capsule Take 1 capsule (100 mg total) by mouth 3 (three) times daily. Patient taking differently: Take 300 mg by mouth at bedtime.  06/15/17  Yes Marvel Plan, MD  glimepiride (AMARYL) 4 MG tablet Take 4 mg by mouth 2 (two) times daily. 10/18/16  Yes [provider]  hydrochlorothiazide (MICROZIDE) 12.5 MG capsule Take 12.5 mg by mouth daily.    Yes [provider]  insulin NPH Human (HUMULIN N,NOVOLIN N) 100 UNIT/ML injection Inject into the skin as directed. Pt takes 40 units in the morning and 55 units at bedtime 06/23/17  Yes [provider]  insulin regular (NOVOLIN R) 100 units/mL injection Inject 10-20 Units into the skin 3 (three) times daily before meals.   Yes [provider]  ipratropium-albuterol (DUONEB) 0.5-2.5 (3) MG/3ML SOLN Inhale 3 mLs into the lungs 4 (four) times daily as needed for wheezing or shortness of breath. 12/23/18  Yes [provider]  loperamide (IMODIUM) 2 MG capsule Take 2-4 mg by mouth as needed for diarrhea or loose stools.    Yes [provider]  loratadine (CLARITIN) 10 MG  tablet Take 10 mg by mouth daily. 04/11/19  Yes [provider]  metoprolol succinate (TOPROL-XL) 25 MG 24 hr tablet Take 3 tablets (75 mg total) by mouth daily. Patient taking differently: Take 25 mg by mouth 2 (two) times a day.  05/15/18  Yes Runell Gess, MD  nitroGLYCERIN (NITROSTAT) 0.4 MG SL tablet Place 1 tablet (0.4 mg total) under the tongue every 5 (five) minutes as needed for chest pain. 08/18/18  Yes Runell Gess, MD  oxybutynin (DITROPAN) 5 MG tablet Take 5 mg by mouth at bedtime. Do not with Dicyclomine 04/20/19  Yes [provider]  pantoprazole (PROTONIX) 40 MG tablet Take 1 tablet (40 mg total) by mouth daily. 03/24/18  Yes Lynann Bologna, MD  pravastatin (PRAVACHOL) 20 MG tablet Take 1 tablet (20 mg total) by mouth at bedtime. 08/18/18  Yes Runell Gess, MD  SYMBICORT 160-4.5 MCG/ACT inhaler Inhale 2 puffs into the lungs 2 (two) times daily. 04/06/19  Yes [provider]  tamsulosin (FLOMAX) 0.4 MG CAPS capsule Take 1 capsule (0.4 mg total) by mouth daily. 09/27/14  Yes Rhyne, Samantha J, PA-C  azelastine (ASTELIN) 0.1 % nasal spray Place 1 spray into both nostrils as directed. Use in each nostril as directed    [provider]  benazepril (LOTENSIN) 10 MG tablet TAKE 2 tablets by mouth TWICE daily Patient not taking: Reported on 05/08/2019 08/18/18   Runell Gess, MD  cefdinir (OMNICEF) 300 MG capsule Take 1 capsule by mouth 2 (two) times daily. 08/17/18   [provider]  dicyclomine (BENTYL) 10 MG capsule Take 10 mg by mouth 4 (four) times daily.  04/22/15   [provider]  furosemide (LASIX) 80 MG tablet Take 1 tablet BY MOUTH DAILY Patient not taking: Reported on 05/08/2019 08/18/18   Runell Gess, MD  hydrALAZINE (APRESOLINE) 25 MG tablet Take 1 tablet (25 mg total) by mouth 3 (three) times daily. Patient not taking: Reported on 05/08/2019 05/15/18   Runell Gess, MD  insulin regular (NOVOLIN R) 100 units/mL  injection daily before breakfast. Sliding Scale    [provider]  Pancrelipase, Lip-Prot-Amyl, (ZENPEP) 40000-126000 units CPEP Take 1 capsule by mouth 3 (three) times daily with meals. Patient not taking: Reported on 05/08/2019 05/25/18   Lynann Bologna, MD  ranitidine (ZANTAC) 75 MG tablet Take 75 mg by mouth at bedtime.    [provider]     Family History  Problem Relation Age of Onset   Heart disease Mother    Diabetes Mother    Hyperlipidemia Mother    Hypertension Mother    Cancer Father        Lung Cancer    Heart disease Father    Heart attack Father    Gout Brother    Asthma Brother    Hyperlipidemia Brother    Hypertension Brother    Diabetes Brother    Colon cancer Brother        colon   Stroke Paternal Grandfather    Colon cancer Sister    Hyperlipidemia Sister    Hypertension Sister    Gout Maternal Uncle    Diabetes Maternal Grandfather    Diabetes Daughter     Social History   Socioeconomic History   Marital status: Married    Spouse name: Not on file   Number of children: 5   Years of education: 12th   Highest education level: Not on file  Occupational History   Occupation: Disabled  Ecologist strain: Not on file   Food insecurity:    Worry: Not on file    Inability: Not on file   Transportation needs:    Medical: Not on file    Non-medical: Not on file  Tobacco Use   Smoking status: Never Smoker   Smokeless tobacco: Never Used  Substance and Sexual Activity   Alcohol use: No    Alcohol/week: 0.0 standard drinks   Drug use: No   Sexual activity: Not Currently    Partners: Female  Lifestyle   Physical activity:    Days per week: Not on file    Minutes per session: Not on file   Stress: Not on file  Relationships   Social connections:    Talks on phone: Not on file    Gets together: Not on file    Attends religious service: Not on file    Active member of  club or organization: Not on  file    Attends meetings of clubs or organizations: Not on file    Relationship status: Not on file  Other Topics Concern   Not on file  Social History Narrative   Patient is married with 5 children.    Patient is right handed.   Patient has hs education.   Patient drinks 4 12 oz can sodas daily.    Review of Systems: A 12 point ROS discussed and pertinent positives are indicated in the HPI above.  All other systems are negative.  Review of Systems  Constitutional: Positive for fever.  Gastrointestinal: Positive for abdominal pain, nausea and vomiting.  Neurological: Positive for weakness.    Vital Signs: BP 112/82    Pulse 74    Temp (!) 100.9 F (38.3 C) (Axillary)    Resp (!) 29    Ht 5\' 6"  (1.676 m)    Wt 244 lb 4.3 oz (110.8 kg)    SpO2 97%    BMI 39.43 kg/m   Physical Exam Vitals signs reviewed.  Constitutional:      Appearance: He is ill-appearing.  Cardiovascular:     Rate and Rhythm: Normal rate.  Pulmonary:     Breath sounds: Normal breath sounds.  Abdominal:     General: Bowel sounds are normal.     Tenderness: There is no abdominal tenderness.  Skin:    General: Skin is warm.  Neurological:     Mental Status: He is disoriented.  Psychiatric:     Comments: Consented wife Darl Pikes via phone      Imaging: Ct Head Wo Contrast  Result Date: 05/08/2019 CLINICAL DATA:  Altered mental status.  Combative. EXAM: CT HEAD WITHOUT CONTRAST TECHNIQUE: Contiguous axial images were obtained from the base of the skull through the vertex without intravenous contrast. COMPARISON:  None. FINDINGS: Brain: Motion degradation of the exam. Exam was repeated with minimal improvement. No gross evidence intracranial hemorrhage. No midline shift or mass effect. Difficult to exclude cortical infarction with the degree of motion. Basal cisterns are patent. Vascular: No hyperdense vessel or unexpected calcification. Skull: Normal. Negative for fracture or  focal lesion. Sinuses/Orbits: Paranasal sinuses and mastoid air cells are clear. Orbits are clear. Other: None. IMPRESSION: Exam is extremely degraded by head motion. Exam was repeated with little improvement. No gross abnormality. Electronically Signed   By: Genevive Bi M.D.   On: 05/08/2019 19:34   Ct Abdomen Pelvis W Contrast  Result Date: 05/09/2019 CLINICAL DATA:  Fever.  Concern for pancreatitis EXAM: CT ABDOMEN AND PELVIS WITH CONTRAST TECHNIQUE: Multidetector CT imaging of the abdomen and pelvis was performed using the standard protocol following bolus administration of intravenous contrast. CONTRAST:  OMNIPAQUE IOHEXOL 300 MG/ML  SOLN COMPARISON:  None. FINDINGS: Lower chest: Bilateral small pleural effusions. No infiltrate lung bases. Central venous congestion. Hepatobiliary: Normal hepatic parenchyma. The gallbladder wall is mildly thickened enhancing this is minimal finding with the gallbladder wall measuring 2 mm. There is pericholecystic fluid in the gallbladder fossa (image 38/3. Several small gallstones are present. The gallbladder bladder is not distended. Common bile duct is normal caliber. Pancreas: Pancreas is normal. No ductal dilatation. No pancreatic inflammation. Spleen: Stomach, small bowel, appendix, and cecum are normal. The colon and rectosigmoid colon are normal. Adrenals/urinary tract: Adrenal glands and kidneys are normal. The ureters and bladder normal. Foley catheter in bladder. Stomach/Bowel: Stomach, small bowel, appendix, and cecum are normal. The colon and rectosigmoid colon are normal. Vascular/Lymphatic: Abdominal aorta is normal caliber with atherosclerotic  calcification. There is no retroperitoneal or periportal lymphadenopathy. No pelvic lymphadenopathy. Reproductive: Prostate normal. Other: No free fluid. Musculoskeletal: No aggressive osseous lesion. IMPRESSION: 1. Cholelithiasis with gallbladder wall thickening and pericholecystic fluid is concerning for  ACUTE CHOLECYSTITIS. No significant gallbladder distension. 2. Common bile duct is normal.  No evidence of pancreatitis. These results will be called to the ordering clinician or representative by the Radiologist Assistant, and communication documented in the PACS or zVision Dashboard. Electronically Signed   By: Genevive Bi M.D.   On: 05/09/2019 13:36   US Abdomen Limited  Result Date: 05/08/2019 CLINICAL DATA:  Initial evaluation for acute fever, elevated LFTs and bilirubin. EXAM: ULTRASOUND ABDOMEN LIMITED RIGHT UPPER QUADRANT COMPARISON:  Prior CT from 01/19/2015 FINDINGS: Gallbladder: 9 mm echogenic focus at the gallbladder neck could reflect a small stone versus tumefactive sludge. Gallbladder wall measured at the upper limits of normal at 3 mm. No free pericholecystic fluid. No sonographic Murphy sign elicited on exam. Common bile duct: Diameter: 3.7 mm Liver: No focal lesion identified. Diffusely increased echogenicity within the hepatic parenchyma. Question of fine nodularity of the hepatic contour. Portal vein is patent on color Doppler imaging with normal direction of blood flow towards the liver. IMPRESSION: 1. 9 mm stone versus tumefactive sludge within the gallbladder neck. No sonographic features to suggest acute cholecystitis. 2. No biliary dilatation. 3. Increased echogenicity within the hepatic parenchyma with question of subtle nodularity of the hepatic contour, which could reflect sequelae of cirrhosis and/or steatosis. Electronically Signed   By: Rise Mu M.D.   On: 05/08/2019 00:16   Dg Chest Port 1 View  Result Date: 05/09/2019 CLINICAL DATA:  Altered mental status and shortness of breath today. EXAM: PORTABLE CHEST 1 VIEW COMPARISON:  Single-view of the chest earlier today. FINDINGS: Cardiomegaly and pulmonary edema persist without notable change. No pneumothorax or pleural effusion. No acute or focal bony abnormality. IMPRESSION: No change in findings most compatible  with congestive heart failure. Electronically Signed   By: Drusilla Kanner M.D.   On: 05/09/2019 16:59   Dg Chest Port 1 View  Result Date: 05/09/2019 CLINICAL DATA:  Shortness of breath. EXAM: PORTABLE CHEST 1 VIEW COMPARISON:  05/08/2019 FINDINGS: Heart size remains stable. New diffuse interstitial and airspace opacities seen throughout both lungs, suspicious for diffuse pulmonary edema. No evidence of focal consolidation or definite pleural effusion. IMPRESSION: New diffuse interstitial and airspace opacities, likely due to diffuse pulmonary edema. Electronically Signed   By: Myles Rosenthal M.D.   On: 05/09/2019 03:40   Dg Chest Port 1 View  Result Date: 05/07/2019 CLINICAL DATA:  Fever EXAM: PORTABLE CHEST 1 VIEW COMPARISON:  12/05/2018 FINDINGS: Mild cardiomegaly with pulmonary vascular congestion. No overt edema. No pneumothorax or sizable pleural effusion. No focal airspace consolidation. IMPRESSION: Mild cardiomegaly with pulmonary vascular congestion. Electronically Signed   By: Deatra Robinson M.D.   On: 05/07/2019 21:38   Dg Abd Acute 2+v W 1v Chest  Result Date: 05/08/2019 CLINICAL DATA:  Shortness of breath, abdominal distension EXAM: DG ABDOMEN ACUTE W/ 1V CHEST COMPARISON:  05/07/2019 FINDINGS: Lungs are clear.  No pleural effusion or pneumothorax. The heart is normal in size. Nonobstructive bowel gas pattern. No evidence of free air on the lateral decubitus view. Degenerative changes of the visualized thoracolumbar spine. IMPRESSION: No evidence of acute cardiopulmonary disease. No evidence of small bowel obstruction or free air. Electronically Signed   By: Charline Bills M.D.   On: 05/08/2019 11:18   Dg Abd Portable  1v  Result Date: 05/09/2019 CLINICAL DATA:  Abdominal distention. Shortness of breath. Congestive heart failure. EXAM: PORTABLE ABDOMEN - 1 VIEW COMPARISON:  None. FINDINGS: The bowel gas pattern is normal. No radio-opaque calculi or other significant radiographic  abnormality are seen. IMPRESSION: Negative. Electronically Signed   By: Myles Rosenthal M.D.   On: 05/09/2019 03:40   [place] Ascites (abdomen Limited)  Result Date: 05/08/2019 CLINICAL DATA:  Abdominal distension. EXAM: LIMITED ABDOMEN ULTRASOUND FOR ASCITES TECHNIQUE: Limited ultrasound survey for ascites was performed in all four abdominal quadrants. COMPARISON:  None. FINDINGS: No ascites. IMPRESSION: No ascites. Electronically Signed   By: Gerome Sam III M.D   On: 05/08/2019 21:49   [place] Ekg Site Rite  Result Date: 05/09/2019 If Site Rite image not attached, placement could not be confirmed due to current cardiac rhythm.   Labs:  CBC: Recent Labs    05/08/19 0513 05/09/19 0408 05/10/19 0441 05/11/19 0347  WBC 17.8* 8.9 7.2 8.1  HGB 12.5* 13.0 12.9* 12.5*  HCT 38.6* 40.7 41.6 40.8  PLT 204 155 144* 166    COAGS: Recent Labs    05/10/19 0441  INR 1.3*    BMP: Recent Labs    05/09/19 0408 05/10/19 0441 05/10/19 2145 05/11/19 0347  NA 147* 152* 154* 158*  K 3.5 3.5 3.5 3.5  CL 110 117* 123* 123*  CO2 GLUCOSE 353* 239* 235* 193*  BUN 18 29* 35* 35*  CALCIUM 8.2* 8.4* 7.8* 8.2*  CREATININE 1.33* 1.72* 1.52* 1.65*  GFRNONAA 58* 43* 49* 45*  GFRAA >60 49* 57* 52*    LIVER FUNCTION TESTS: Recent Labs    05/08/19 0513 05/09/19 0408 05/10/19 0441 05/11/19 0347  BILITOT 3.5* 5.1* 4.1* 3.3*  AST 532* 334* 230* 169*  ALT 414* 460* 358* 262*  ALKPHOS 75 87 84 85  PROT 6.2* 6.6 6.6 6.2*  ALBUMIN 3.0* 2.8* 2.6* 2.4*    TUMOR MARKERS: No results for input(s): AFPTM, CEA, CA199, CHROMGRNA in the last 8760 hours.  Assessment and Plan:  Percutaneous cholecystostomy drain placement in  IR today Risks and benefits discussed with the patient's wife including, but not limited to bleeding, infection, gallbladder perforation, bile leak, sepsis or even death.  All of her questions were answered, she is agreeable to proceed. Consent signed and in  chart.   Thank you for this interesting consult.  I greatly enjoyed meeting Keon Pender and look forward to participating in their care.  A copy of this report was sent to the requesting provider on this date.  Electronically Signed: Robet Leu, PA-C 05/11/2019, 10:48 AM   I spent a total of 40 Minutes    in face to face in clinical consultation, greater than 50% of which was counseling/coordinating care for percutaneous chole drain

## 2019-05-11 NOTE — Progress Notes (Signed)
[facility]/Trauma Progress Note      Assessment/Plan CAD GERD Hx of MI HLD HTN PVD Hx of stroke, left sided weakness  Sepsis  Bacteremia - Klebsiella pneumoniae  Gallstone pancreatitis with probable acute cholecystitis - pt has continued AMS and do not feel he is a good operative candidate at this time - recommend per chole drain placement by IR - we will follow  FEN: CLD okay VTE: SCD's, lovenox ID: maxepime 05/29-06/01 Flagyl 05/30-06/09  Ancef 06/01-06/09 Foley: yes per medicine Follow up: TBD    LOS: 3 days    Subjective/Chief complaint:  Pt will open his eyes and look at me but will not answer questions.   Objective: Vital signs in last 24 hours: Temp:  [97.6 F (36.4 C)-98.3 F (36.8 C)] 98.2 F (36.8 C) (06/02 0351) Pulse Rate:  [59-86] 76 (06/02 0600) Resp:  [15-36] 27 (06/02 0600) BP: (116-179)/(67-112) 147/70 (06/02 0600) SpO2:  [93 %-99 %] 98 % (06/02 0600) Weight:  [110.8 kg] 110.8 kg (06/02 0416) Last BM Date: 05/08/19  Intake/Output from previous day: 06/01 0701 - 06/02 0700 In: 2041.8 [I.V.:1189.2; IV Piggyback:852.6] Out: 1500 [Urine:1500] Intake/Output this shift: No intake/output data recorded.  PE: Gen:  ill appearing, NAD, does not follow commands HEENT: scleral icterus, PERRL, mucus membranes dry Pulm:  tachypnea, [place] in place Abd: Soft, obese, mild distention, no response with palpation of abdomen  Skin: warm and dry Neuro: does not follow commands   Anti-infectives: Anti-infectives (From admission, onward)   Start     Dose/Rate Route Frequency Ordered Stop   05/10/19 1600  vancomycin (VANCOCIN) 1,250 mg in sodium chloride 0.9 % 250 mL IVPB  Status:  Discontinued     1,250 mg 166.7 mL/hr over 90 Minutes Intravenous Every 24 hours 05/09/19 1453 05/09/19 1801   05/10/19 1400  ceFAZolin (ANCEF) IVPB 1 g/50 mL premix  Status:  Discontinued     1 g 100 mL/hr over 30 Minutes Intravenous Every 8 hours 05/10/19 0910  05/10/19 1017   05/10/19 1400  ceFAZolin (ANCEF) IVPB 2g/100 mL premix     2 g 200 mL/hr over 30 Minutes Intravenous Every 8 hours 05/10/19 1017 05/18/19 1359   05/08/19 1730  metroNIDAZOLE (FLAGYL) IVPB 500 mg     500 mg 100 mL/hr over 60 Minutes Intravenous Every 8 hours 05/08/19 1657 05/18/19 1729   05/08/19 1000  vancomycin (VANCOCIN) 1,750 mg in sodium chloride 0.9 % 500 mL IVPB  Status:  Discontinued     1,750 mg 250 mL/hr over 120 Minutes Intravenous Every 12 hours 05/07/19 2153 05/09/19 1453   05/08/19 0600  ceFEPIme (MAXIPIME) 2 g in sodium chloride 0.9 % 100 mL IVPB  Status:  Discontinued     2 g 200 mL/hr over 30 Minutes Intravenous Every 8 hours 05/07/19 2153 05/10/19 0910   05/07/19 2130  ceFEPIme (MAXIPIME) 2 g in sodium chloride 0.9 % 100 mL IVPB     2 g 200 mL/hr over 30 Minutes Intravenous  Once 05/07/19 2115 05/07/19 2232   05/07/19 2130  vancomycin (VANCOCIN) IVPB 1000 mg/200 mL premix  Status:  Discontinued     1,000 mg 200 mL/hr over 60 Minutes Intravenous  Once 05/07/19 2115 05/07/19 2120   05/07/19 2130  vancomycin (VANCOCIN) 1,500 mg in sodium chloride 0.9 % 500 mL IVPB     1,500 mg 250 mL/hr over 120 Minutes Intravenous  Once 05/07/19 2120 05/08/19 0040      Lab Results:  Recent Labs  05/10/19 0441 05/11/19 0347  WBC 7.2 8.1  HGB 12.9* 12.5*  HCT 41.6 40.8  PLT 144* 166   BMET Recent Labs    05/10/19 2145 05/11/19 0347  NA 154* 158*  K 3.5 3.5  CL 123* 123*  CO2 23 25  GLUCOSE 235* 193*  BUN 35* 35*  CREATININE 1.52* 1.65*  CALCIUM 7.8* 8.2*   PT/INR Recent Labs    05/10/19 0441  LABPROT 16.4*  INR 1.3*   CMP     Component Value Date/Time   NA 158 (H) 05/11/2019 0347   NA 144 09/07/2018 0949   K 3.5 05/11/2019 0347   CL 123 (H) 05/11/2019 0347   CO2 25 05/11/2019 0347   GLUCOSE 193 (H) 05/11/2019 0347   BUN 35 (H) 05/11/2019 0347   BUN 22 09/07/2018 0949   CREATININE 1.65 (H) 05/11/2019 0347   CREATININE 0.72 04/18/2015  1140   CALCIUM 8.2 (L) 05/11/2019 0347   PROT 6.2 (L) 05/11/2019 0347   PROT 7.3 08/27/2017 1039   ALBUMIN 2.4 (L) 05/11/2019 0347   ALBUMIN 4.5 08/27/2017 1039   AST 169 (H) 05/11/2019 0347   ALT 262 (H) 05/11/2019 0347   ALKPHOS 85 05/11/2019 0347   BILITOT 3.3 (H) 05/11/2019 0347   BILITOT 0.2 08/27/2017 1039   GFRNONAA 45 (L) 05/11/2019 0347   GFRAA 52 (L) 05/11/2019 0347   Lipase     Component Value Date/Time   LIPASE 17 05/10/2019 0441    Studies/Results: Ct Abdomen Pelvis W Contrast  Result Date: 05/09/2019 CLINICAL DATA:  Fever.  Concern for pancreatitis EXAM: CT ABDOMEN AND PELVIS WITH CONTRAST TECHNIQUE: Multidetector CT imaging of the abdomen and pelvis was performed using the standard protocol following bolus administration of intravenous contrast. CONTRAST:  OMNIPAQUE IOHEXOL 300 MG/ML  SOLN COMPARISON:  None. FINDINGS: Lower chest: Bilateral small pleural effusions. No infiltrate lung bases. Central venous congestion. Hepatobiliary: Normal hepatic parenchyma. The gallbladder wall is mildly thickened enhancing this is minimal finding with the gallbladder wall measuring 2 mm. There is pericholecystic fluid in the gallbladder fossa (image 38/3. Several small gallstones are present. The gallbladder bladder is not distended. Common bile duct is normal caliber. Pancreas: Pancreas is normal. No ductal dilatation. No pancreatic inflammation. Spleen: Stomach, small bowel, appendix, and cecum are normal. The colon and rectosigmoid colon are normal. Adrenals/urinary tract: Adrenal glands and kidneys are normal. The ureters and bladder normal. Foley catheter in bladder. Stomach/Bowel: Stomach, small bowel, appendix, and cecum are normal. The colon and rectosigmoid colon are normal. Vascular/Lymphatic: Abdominal aorta is normal caliber with atherosclerotic calcification. There is no retroperitoneal or periportal lymphadenopathy. No pelvic lymphadenopathy. Reproductive: Prostate normal.  Other: No free fluid. Musculoskeletal: No aggressive osseous lesion. IMPRESSION: 1. Cholelithiasis with gallbladder wall thickening and pericholecystic fluid is concerning for ACUTE CHOLECYSTITIS. No significant gallbladder distension. 2. Common bile duct is normal.  No evidence of pancreatitis. These results will be called to the ordering clinician or representative by the Radiologist Assistant, and communication documented in the PACS or zVision Dashboard. Electronically Signed   By: Genevive Bi M.D.   On: 05/09/2019 13:36   Dg Chest Port 1 View  Result Date: 05/09/2019 CLINICAL DATA:  Altered mental status and shortness of breath today. EXAM: PORTABLE CHEST 1 VIEW COMPARISON:  Single-view of the chest earlier today. FINDINGS: Cardiomegaly and pulmonary edema persist without notable change. No pneumothorax or pleural effusion. No acute or focal bony abnormality. IMPRESSION: No change in findings most compatible with congestive heart  failure. Electronically Signed   By: Drusilla Kanner M.D.   On: 05/09/2019 16:59   [place] Ekg Site Rite  Result Date: 05/09/2019 If Site Rite image not attached, placement could not be confirmed due to current cardiac rhythm.     Jerre Simon , [facility] 05/11/2019, 7:35 AM  Pager: [phone] Mon-Wed, Friday 7:00am-4:30pm Thurs 7am-11:30am  Consults: [phone]

## 2019-05-11 NOTE — Progress Notes (Signed)
Progress Note   Subjective  Patient's mental status worse than when I saw him yesterday, not responding to questioning. Has been NPO. Low grade temp this AM. Mild troponin elevation, worsening hypernatremia.   Objective   Vital signs in last 24 hours: Temp:  [97.6 F (36.4 C)-100.9 F (38.3 C)] 100.9 F (38.3 C) (06/02 0700) Pulse Rate:  [62-86] 76 (06/02 0600) Resp:  [15-36] 27 (06/02 0600) BP: (116-179)/(68-112) 147/70 (06/02 0600) SpO2:  [93 %-99 %] 98 % (06/02 0600) Weight:  [110.8 kg] 110.8 kg (06/02 0416) Last BM Date: 05/08/19 General:    white male in bed, not responding to questioning Heart:  Regular rate and rhythm; Lungs: Respirations even and unlabored,  Abdomen:  Soft, protuberant, mildly distended, no peritoneal signs.  Extremities:  Without edema. Neurologic:  No responding to questioning, sedated   Intake/Output from previous day: 06/01 0701 - 06/02 0700 In: 2041.8 [I.V.:1189.2; IV Piggyback:852.6] Out: 1500 [Urine:1500] Intake/Output this shift: No intake/output data recorded.  Lab Results: Recent Labs    05/09/19 0408 05/10/19 0441 05/11/19 0347  WBC 8.9 7.2 8.1  HGB 13.0 12.9* 12.5*  HCT 40.7 41.6 40.8  PLT 155 144* 166   BMET Recent Labs    05/10/19 0441 05/10/19 2145 05/11/19 0347  NA 152* 154* 158*  K 3.5 3.5 3.5  CL 117* 123* 123*  CO2 22 23 25   GLUCOSE 239* 235* 193*  BUN 29* 35* 35*  CREATININE 1.72* 1.52* 1.65*  CALCIUM 8.4* 7.8* 8.2*   LFT Recent Labs    05/11/19 0347  PROT 6.2*  ALBUMIN 2.4*  AST 169*  ALT 262*  ALKPHOS 85  BILITOT 3.3*   PT/INR Recent Labs    05/10/19 0441  LABPROT 16.4*  INR 1.3*    Studies/Results: Ct Abdomen Pelvis W Contrast  Result Date: 05/09/2019 CLINICAL DATA:  Fever.  Concern for pancreatitis EXAM: CT ABDOMEN AND PELVIS WITH CONTRAST TECHNIQUE: Multidetector CT imaging of the abdomen and pelvis was performed using the standard protocol following bolus administration of  intravenous contrast. CONTRAST:  OMNIPAQUE IOHEXOL 300 MG/ML  SOLN COMPARISON:  None. FINDINGS: Lower chest: Bilateral small pleural effusions. No infiltrate lung bases. Central venous congestion. Hepatobiliary: Normal hepatic parenchyma. The gallbladder wall is mildly thickened enhancing this is minimal finding with the gallbladder wall measuring 2 mm. There is pericholecystic fluid in the gallbladder fossa (image 38/3. Several small gallstones are present. The gallbladder bladder is not distended. Common bile duct is normal caliber. Pancreas: Pancreas is normal. No ductal dilatation. No pancreatic inflammation. Spleen: Stomach, small bowel, appendix, and cecum are normal. The colon and rectosigmoid colon are normal. Adrenals/urinary tract: Adrenal glands and kidneys are normal. The ureters and bladder normal. Foley catheter in bladder. Stomach/Bowel: Stomach, small bowel, appendix, and cecum are normal. The colon and rectosigmoid colon are normal. Vascular/Lymphatic: Abdominal aorta is normal caliber with atherosclerotic calcification. There is no retroperitoneal or periportal lymphadenopathy. No pelvic lymphadenopathy. Reproductive: Prostate normal. Other: No free fluid. Musculoskeletal: No aggressive osseous lesion. IMPRESSION: 1. Cholelithiasis with gallbladder wall thickening and pericholecystic fluid is concerning for ACUTE CHOLECYSTITIS. No significant gallbladder distension. 2. Common bile duct is normal.  No evidence of pancreatitis. These results will be called to the ordering clinician or representative by the Radiologist Assistant, and communication documented in the PACS or zVision Dashboard. Electronically Signed   By: Genevive Bi M.D.   On: 05/09/2019 13:36   Dg Chest Port 1 View  Result Date: 05/09/2019 CLINICAL  DATA:  Altered mental status and shortness of breath today. EXAM: PORTABLE CHEST 1 VIEW COMPARISON:  Single-view of the chest earlier today. FINDINGS: Cardiomegaly and  pulmonary edema persist without notable change. No pneumothorax or pleural effusion. No acute or focal bony abnormality. IMPRESSION: No change in findings most compatible with congestive heart failure. Electronically Signed   By: Drusilla Kanner M.D.   On: 05/09/2019 16:59   [place] Ekg Site Rite  Result Date: 05/09/2019 If Site Rite image not attached, placement could not be confirmed due to current cardiac rhythm.      Assessment / Plan:    58 y/o male with biliary pancreatitis and suspected cholecystitis, klebsiella bacteremia. On antibiotics and WBC has downtrended, LAEs improving, however continues to have mental status change, low grade temp this AM. On CT his pancreas is normal, CBD has appeared normal. Thought to be poor surgical candidate right now for cholecystectomy, defer timing of this to general surgery. In the interim if he worsens despite antibiotics then recommend percutaneous chole drain. As his liver enzymes continue to improve he probably passed a stone, not acting obstructed, do not think he warrants an ERCP.   Will sign off for now, please call with questions moving forward.  Ileene Patrick, MD [facility]

## 2019-05-11 NOTE — Progress Notes (Signed)
eLink Physician-Brief Progress Note Patient Name: Johnathan Keith DOB: Nov 11, 1961 MRN: [id number]   Date of Service  05/11/2019  HPI/Events of Note  Nursing concerned about increased O2 requirement and ALOC. Now on 100% NRBM.   eICU Interventions  Will order: 1. ABG STAT. 2. Will ask ground team to evaluate at bedside.      Intervention Category Major Interventions: Change in mental status - evaluation and management;Hypoxemia - evaluation and management  Lenell Antu 05/11/2019, 8:41 PM

## 2019-05-11 NOTE — Procedures (Signed)
Cholecystitis  S/p [place] AND FLUORO PERC CHOLECYSTOSTOMY  No comp Stable ebl min Bile cx sent Full report in pacs

## 2019-05-12 ENCOUNTER — Inpatient Hospital Stay (HOSPITAL_COMMUNITY): Payer: Medicare HMO

## 2019-05-12 DIAGNOSIS — N179 Acute kidney failure, unspecified: Secondary | ICD-10-CM

## 2019-05-12 LAB — CBC WITH DIFFERENTIAL/PLATELET
Abs Immature Granulocytes: 0.03 10*3/uL (ref 0.00–0.07)
Basophils Absolute: 0 10*3/uL (ref 0.0–0.1)
Basophils Relative: 0 %
Eosinophils Absolute: 0.1 10*3/uL (ref 0.0–0.5)
Eosinophils Relative: 2 %
HCT: 39.1 % (ref 39.0–52.0)
Hemoglobin: 11.9 g/dL — ABNORMAL LOW (ref 13.0–17.0)
Immature Granulocytes: 0 %
Lymphocytes Relative: 17 %
Lymphs Abs: 1.4 10*3/uL (ref 0.7–4.0)
MCH: 28.8 pg (ref 26.0–34.0)
MCHC: 30.4 g/dL (ref 30.0–36.0)
MCV: 94.7 fL (ref 80.0–100.0)
Monocytes Absolute: 0.6 10*3/uL (ref 0.1–1.0)
Monocytes Relative: 7 %
Neutro Abs: 6.1 10*3/uL (ref 1.7–7.7)
Neutrophils Relative %: 74 %
Platelets: 151 10*3/uL (ref 150–400)
RBC: 4.13 MIL/uL — ABNORMAL LOW (ref 4.22–5.81)
RDW: 17.7 % — ABNORMAL HIGH (ref 11.5–15.5)
WBC: 8.2 10*3/uL (ref 4.0–10.5)
nRBC: 0 % (ref 0.0–0.2)

## 2019-05-12 LAB — BASIC METABOLIC PANEL
Anion gap: 10 (ref 5–15)
Anion gap: 8 (ref 5–15)
BUN: 30 mg/dL — ABNORMAL HIGH (ref 6–20)
BUN: 26 mg/dL — ABNORMAL HIGH (ref 6–20)
CO2: 25 mmol/L (ref 22–32)
CO2: 24 mmol/L (ref 22–32)
Calcium: 8.3 mg/dL — ABNORMAL LOW (ref 8.9–10.3)
Calcium: 7.9 mg/dL — ABNORMAL LOW (ref 8.9–10.3)
Chloride: 125 mmol/L — ABNORMAL HIGH (ref 98–111)
Chloride: 118 mmol/L — ABNORMAL HIGH (ref 98–111)
Creatinine, Ser: 1.36 mg/dL — ABNORMAL HIGH (ref 0.61–1.24)
Creatinine, Ser: 1.15 mg/dL (ref 0.61–1.24)
GFR calc Af Amer: >60 mL/min (ref >60)
GFR calc Af Amer: >60 mL/min (ref >60)
GFR calc non Af Amer: 57 mL/min — ABNORMAL LOW (ref >60)
GFR calc non Af Amer: >60 mL/min (ref >60)
Glucose, Bld: 215 mg/dL — ABNORMAL HIGH (ref 70–99)
Glucose, Bld: 451 mg/dL — ABNORMAL HIGH (ref 70–99)
Potassium: 3.2 mmol/L — ABNORMAL LOW (ref 3.5–5.1)
Potassium: 3.3 mmol/L — ABNORMAL LOW (ref 3.5–5.1)
Sodium: 160 mmol/L — ABNORMAL HIGH (ref 135–145)
Sodium: 150 mmol/L — ABNORMAL HIGH (ref 135–145)

## 2019-05-12 LAB — GLUCOSE, CAPILLARY
Glucose-Capillary: 223 mg/dL — ABNORMAL HIGH (ref 70–99)
Glucose-Capillary: 234 mg/dL — ABNORMAL HIGH (ref 70–99)
Glucose-Capillary: 189 mg/dL — ABNORMAL HIGH (ref 70–99)
Glucose-Capillary: 167 mg/dL — ABNORMAL HIGH (ref 70–99)

## 2019-05-12 LAB — PHOSPHORUS
Phosphorus: 1.6 mg/dL — ABNORMAL LOW (ref 2.5–4.6)
Phosphorus: 2.3 mg/dL — ABNORMAL LOW (ref 2.5–4.6)

## 2019-05-12 LAB — MAGNESIUM: Magnesium: 2.6 mg/dL — ABNORMAL HIGH (ref 1.7–2.4)

## 2019-05-12 IMAGING — DX PORTABLE CHEST - 1 VIEW
1 series · 1 of 1 positions shown · non-contrast
Comparison: [DATE]

CLINICAL DATA: Respiratory failure

EXAM:
PORTABLE CHEST 1 VIEW

[chest ap]
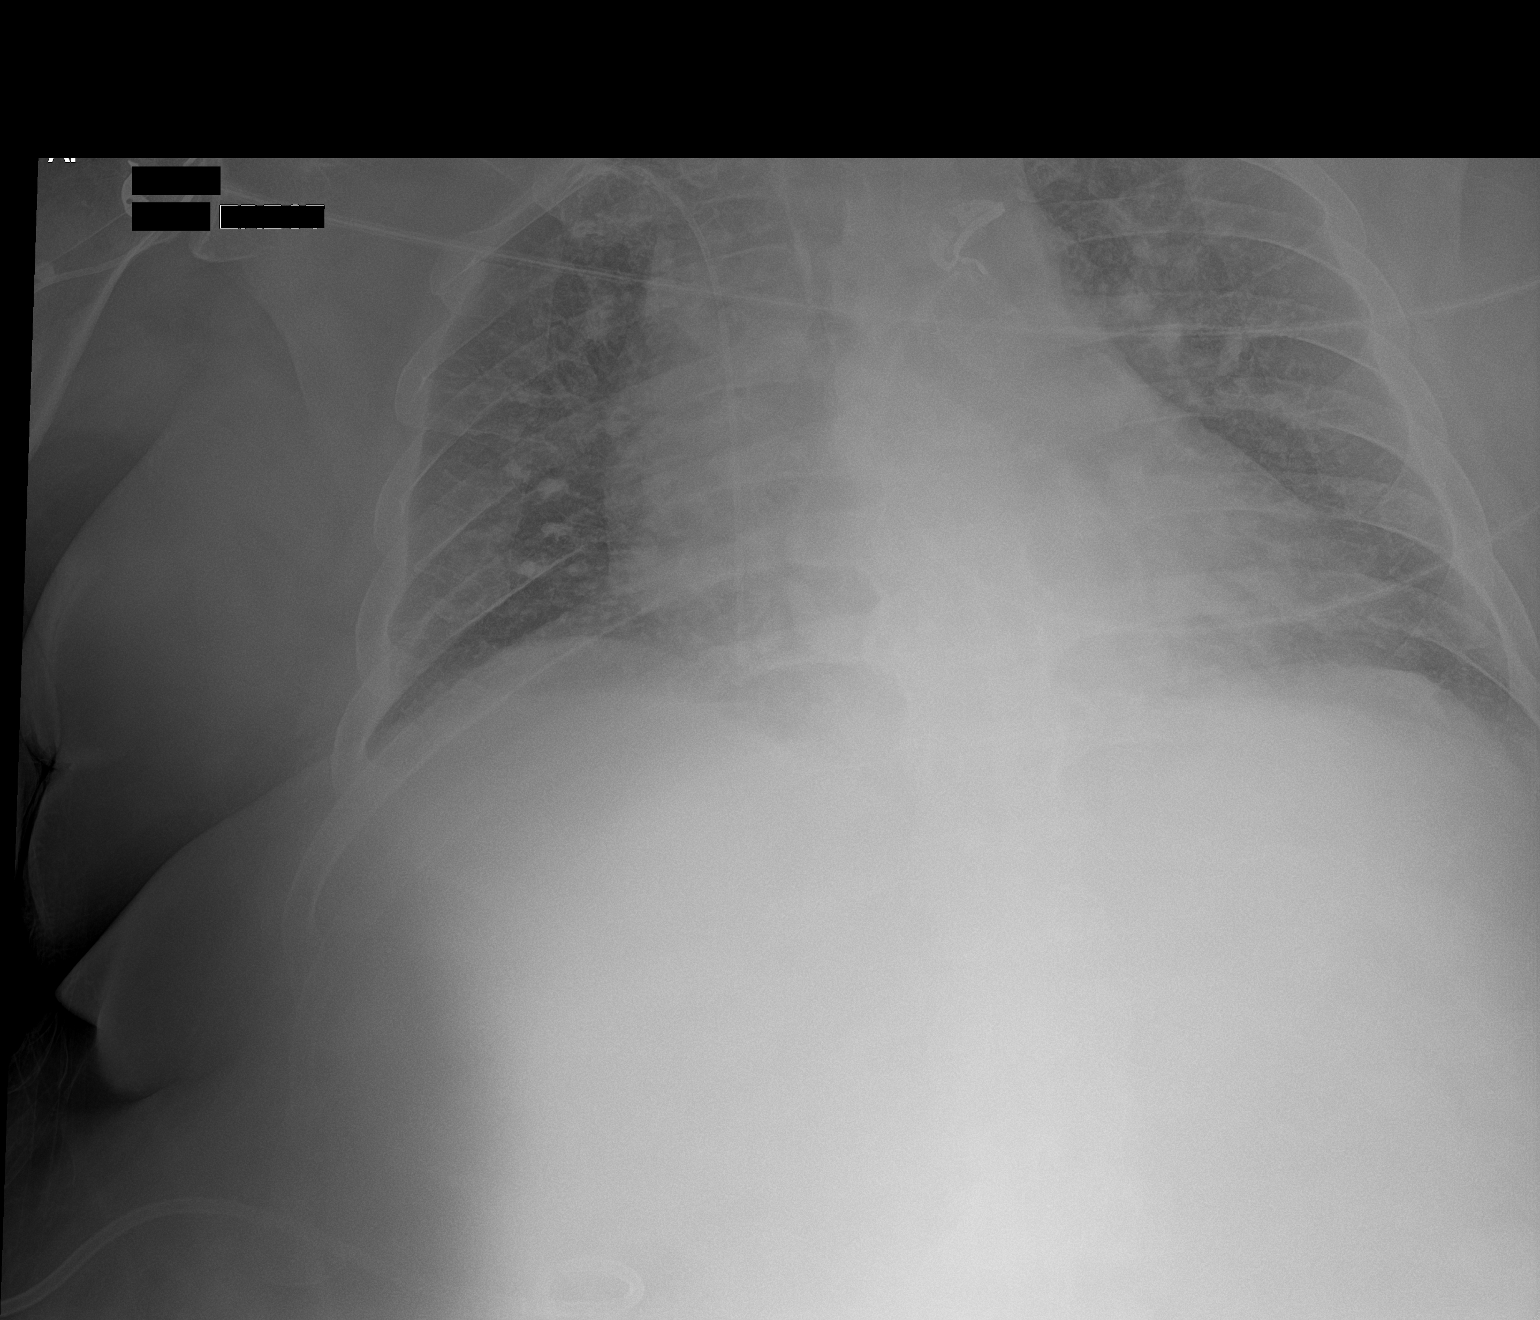

[1 of 1 positions shown; findings below may reference images not displayed]

FINDINGS: Cardiac shadow remains enlarged but accentuated by the portable
technique. Right-sided PICC line is noted in satisfactory position.
Poor inspiratory effort with congestive failure is again identified
but improved when compared with the prior exam. No new focal
infiltrate is seen. No bony abnormality is noted.
IMPRESSION: Tubes and lines as described.

Changes of congestive failure but improved from the prior study.

## 2019-05-12 MED ORDER — POTASSIUM PHOSPHATES 15 MMOLE/5ML IV SOLN
30.0000 mmol | Freq: Once | INTRAVENOUS | Status: AC
  Administered 2019-05-12: 05:00:00 30 mmol via INTRAVENOUS
  Filled 2019-05-12: qty 10

## 2019-05-12 MED ORDER — DEXTROSE 5 % IV SOLN
INTRAVENOUS | Status: DC
  Administered 2019-05-12: 10:00:00 1000 mL via INTRAVENOUS
  Administered 2019-05-13: 23:00:00 via INTRAVENOUS

## 2019-05-12 NOTE — Progress Notes (Signed)
Patient ID: Johnathan Keith, male   DOB: 10-12-1961, 58 y.o.   MRN: [id number]   IR Round note via phone Fleet Contras RN  Perc chole drain placed 6/2 OP 80 cc in last few hrs-- bile color Site c/d/i NT No bleeding  Afeb  Pt is much better this am Calm and off Precedex He is aware of place and surroundings per RN  Plan per PMD Drain to remain x 6-8 weeks - unless goes to OR for cholecystectomy IR will follow as OP id DC'd with drain

## 2019-05-12 NOTE — Progress Notes (Signed)
Assisted tele visit to patient with family member.  Wiktoria Hemrick Anderson, RN   

## 2019-05-12 NOTE — Progress Notes (Signed)
[facility]/Trauma Progress Note      Assessment/Plan CAD GERD Hx of MI HLD HTN PVD Hx of stroke, left sided weakness  Sepsis  Bacteremia - Klebsiella pneumoniae Gallstone pancreatitis with probable acute cholecystitis - S/P perc chole drain placement by IR, 06/02 - drain bilious, LFT's and tbili have been trending down, repeat CMP tomorrow AM - lipase 17 on 06/01 - we will follow  ZOX:WRUE okay from our standpoint but will defer to CCM VTE: SCD's, lovenox AV:[id number] 05/29-06/01 Flagyl 05/30-06/09Ancef 06/01-06/09 Foley:yes per medicine Follow up:TBD  Dispo: mental status appears to be improved today. Okay for a diet from a surgical standpoint but pt may need a speech eval prior to, to ensure he will not aspirate.    LOS: 4 days    Subjective: CC: "mouth is dry"  Pt knows he is in the hospital but he does not know why. He does not know the year. He states he is not having any abdominal pain. He denies drinking ETOH.   Objective: Vital signs in last 24 hours: Temp:  [98 F (36.7 C)-100.3 F (37.9 C)] 98.2 F (36.8 C) (06/03 0700) Pulse Rate:  [63-96] 63 (06/03 0630) Resp:  [12-32] 20 (06/03 0700) BP: (93-206)/(51-147) 147/84 (06/03 0700) SpO2:  [77 %-100 %] 99 % (06/03 0700) Weight:  [110.8 kg] 110.8 kg (06/03 0135) Last BM Date: 05/08/19  Intake/Output from previous day: 06/02 0701 - 06/03 0700 In: 2721.8 [I.V.:1920.4; IV Piggyback:801.4] Out: 2285 [Urine:2175; Drains:110] Intake/Output this shift: No intake/output data recorded.  PE: Gen: NAD, more alert today.  HEENT: scleral icterus improved, PERRL, mucus membranes dry Pulm:tachypnea, [place] in place Abd: Soft,obese, mild distention, mild guarding with palpation of RUQ. No peritonitis  Skin: warm and dry Neuro: follow some commands   Anti-infectives: Anti-infectives (From admission, onward)   Start     Dose/Rate Route Frequency Ordered Stop   05/10/19 1600  vancomycin  (VANCOCIN) 1,250 mg in sodium chloride 0.9 % 250 mL IVPB  Status:  Discontinued     1,250 mg 166.7 mL/hr over 90 Minutes Intravenous Every 24 hours 05/09/19 1453 05/09/19 1801   05/10/19 1400  ceFAZolin (ANCEF) IVPB 1 g/50 mL premix  Status:  Discontinued     1 g 100 mL/hr over 30 Minutes Intravenous Every 8 hours 05/10/19 0910 05/10/19 1017   05/10/19 1400  ceFAZolin (ANCEF) IVPB 2g/100 mL premix     2 g 200 mL/hr over 30 Minutes Intravenous Every 8 hours 05/10/19 1017 05/18/19 1359   05/08/19 1730  metroNIDAZOLE (FLAGYL) IVPB 500 mg     500 mg 100 mL/hr over 60 Minutes Intravenous Every 8 hours 05/08/19 1657 05/18/19 1729   05/08/19 1000  vancomycin (VANCOCIN) 1,750 mg in sodium chloride 0.9 % 500 mL IVPB  Status:  Discontinued     1,750 mg 250 mL/hr over 120 Minutes Intravenous Every 12 hours 05/07/19 2153 05/09/19 1453   05/08/19 0600  ceFEPIme (MAXIPIME) 2 g in sodium chloride 0.9 % 100 mL IVPB  Status:  Discontinued     2 g 200 mL/hr over 30 Minutes Intravenous Every 8 hours 05/07/19 2153 05/10/19 0910   05/07/19 2130  ceFEPIme (MAXIPIME) 2 g in sodium chloride 0.9 % 100 mL IVPB     2 g 200 mL/hr over 30 Minutes Intravenous  Once 05/07/19 2115 05/07/19 2232   05/07/19 2130  vancomycin (VANCOCIN) IVPB 1000 mg/200 mL premix  Status:  Discontinued     1,000 mg 200 mL/hr over 60 Minutes Intravenous  Once 05/07/19 2115 05/07/19 2120   05/07/19 2130  vancomycin (VANCOCIN) 1,500 mg in sodium chloride 0.9 % 500 mL IVPB     1,500 mg 250 mL/hr over 120 Minutes Intravenous  Once 05/07/19 2120 05/08/19 0040      Lab Results:  Recent Labs    05/11/19 0347 05/11/19 2058 05/12/19 0308  WBC 8.1  --  8.2  HGB 12.5* 11.9* 11.9*  HCT 40.8 35.0* 39.1  PLT 166  --  151   BMET Recent Labs    05/11/19 0347 05/11/19 2058 05/12/19 0308  NA 158* 163* 160*  K 3.5 3.4* 3.2*  CL 123*  --  125*  CO2 25  --  25  GLUCOSE 193*  --  215*  BUN 35*  --  30*  CREATININE 1.65*  --  1.36*   CALCIUM 8.2*  --  8.3*   PT/INR Recent Labs    05/10/19 0441  LABPROT 16.4*  INR 1.3*   CMP     Component Value Date/Time   NA 160 (H) 05/12/2019 0308   NA 144 09/07/2018 0949   K 3.2 (L) 05/12/2019 0308   CL 125 (H) 05/12/2019 0308   CO2 25 05/12/2019 0308   GLUCOSE 215 (H) 05/12/2019 0308   BUN 30 (H) 05/12/2019 0308   BUN 22 09/07/2018 0949   CREATININE 1.36 (H) 05/12/2019 0308   CREATININE 0.72 04/18/2015 1140   CALCIUM 8.3 (L) 05/12/2019 0308   PROT 6.2 (L) 05/11/2019 0347   PROT 7.3 08/27/2017 1039   ALBUMIN 2.4 (L) 05/11/2019 0347   ALBUMIN 4.5 08/27/2017 1039   AST 169 (H) 05/11/2019 0347   ALT 262 (H) 05/11/2019 0347   ALKPHOS 85 05/11/2019 0347   BILITOT 3.3 (H) 05/11/2019 0347   BILITOT 0.2 08/27/2017 1039   GFRNONAA 57 (L) 05/12/2019 0308   GFRAA >60 05/12/2019 0308   Lipase     Component Value Date/Time   LIPASE 17 05/10/2019 0441    Studies/Results: US Renal  Result Date: 05/11/2019 CLINICAL DATA:  Renal failure EXAM: RENAL / URINARY TRACT ULTRASOUND COMPLETE COMPARISON:  CT from 05/09/2019 FINDINGS: Right Kidney: Renal measurements: 13.3 x 6.3 x 6.6 cm. = volume: 290 mL . Echogenicity within normal limits. No mass or hydronephrosis visualized. Left Kidney: Renal measurements: 12.1 x 6.8 x 5.9 cm = volume: 254 mL. Echogenicity within normal limits. No mass or hydronephrosis visualized. Bladder: Decompressed by Foley catheter. IMPRESSION: Normal appearing kidneys bilaterally. Electronically Signed   By: Alcide Clever M.D.   On: 05/11/2019 12:44   Ir Perc Cholecystostomy  Result Date: 05/11/2019 INDICATION: Sepsis, cholecystitis EXAM: ULTRASOUND FLUOROSCOPIC 10 FR[place] PERCUTANEOUS CHOLECYSTOSTOMY MEDICATIONS: Patient is already receiving IV antibiotics as an inpatient ANESTHESIA/SEDATION: 100 mg Ketamine provided by the critical care medicine staff Moderate Sedation Time: None. The patient's level of consciousness and vital signs were monitored continuously  by radiology nursing throughout the procedure under my direct supervision. FLUOROSCOPY TIME:  Fluoroscopy Time: 0 minutes 42 seconds (28 mGy). COMPLICATIONS: None immediate. PROCEDURE: Informed written consent was obtained from the patient's family after a thorough discussion of the procedural risks, benefits and alternatives. All questions were addressed. Maximal Sterile Barrier Technique was utilized including caps, mask, sterile gowns, sterile gloves, sterile drape, hand hygiene and skin antiseptic. A timeout was performed prior to the initiation of the procedure. Preliminary ultrasound performed. The gallbladder was localized in the right upper quadrant. This was correlated with the CT. Overlying skin marked. Under sterile conditions and local anesthesia, ultrasound  percutaneous transhepatic needle access performed with a 21 gauge needle. Needle position confirmed with ultrasound. Images obtained for documentation for ultrasound access. There was return of green bile. Sample sent for culture. Guidewire inserted followed by the Accustick dilator set. Amplatz guidewire inserted followed by tract dilatation to insert a 10 [place] drain. Drain catheter position confirmed with ultrasound and fluoroscopy. Images obtained for documentation. 30 cc bile aspirated. Catheter connected to external gravity drainage bag. Catheter secured with Prolene suture and a sterile dressing. No immediate complication. Patient tolerated the procedure well. IMPRESSION: Successful ultrasound fluoroscopic 10 [place] percutaneous transhepatic cholecystostomy. Bile culture sent. Electronically Signed   By: Judie Petit.  Shick M.D.   On: 05/11/2019 15:23   Dg Chest Port 1 View  Result Date: 05/12/2019 CLINICAL DATA:  Respiratory failure EXAM: PORTABLE CHEST 1 VIEW COMPARISON:  05/09/2019 FINDINGS: Cardiac shadow remains enlarged but accentuated by the portable technique. Right-sided PICC line is noted in satisfactory position. Poor inspiratory effort  with congestive failure is again identified but improved when compared with the prior exam. No new focal infiltrate is seen. No bony abnormality is noted. IMPRESSION: Tubes and lines as described. Changes of congestive failure but improved from the prior study. Electronically Signed   By: Alcide Clever M.D.   On: 05/12/2019 07:25      Jerre Simon , [facility] 05/12/2019, 7:59 AM  Pager: [phone] Mon-Wed, Friday 7:00am-4:30pm Thurs 7am-11:30am  Consults: [phone]

## 2019-05-12 NOTE — Progress Notes (Addendum)
NAME:  Johnathan Keith, MRN:  [id number], DOB:  1961-07-28, LOS: 4 ADMISSION DATE:  05/07/2019, CONSULTATION DATE:  05/09/2019 REFERRING MD:  Dr [name] Chimes, CHIEF COMPLAINT:  Acute encephalopathy, SIRs, gallstone pancreatitis   Brief History   See belo  History of present illness   58 year old obese male with acid reflux, hypertension, coronary artery disease and diabetes.  He arrived in the emergency department May 07, 2019 because of fever, chills, generalized weakness and confusion associated with nausea and vomiting.  Initially fever was 100.3 and tachycardic.  His initial lactic acid was 3.9.  His COVID-19 test was negative.  According to the bedside nurse for most of the day on May 08, 2019 he had hypoactive delirium and significant sirs physiology.  However today they feel he is somewhat improved with improved AKI and resolved lactic acidosis and improving LFT and HR and RR.  However, because of his agitated delirium  has been difficult to control and MEWS score is 6. Therefore critical care medicine has been consulted.  Evaluation is shown that GI and [facility] have consulted on him and the unifying diagnosis is 1 of gallstone pancreatitis [admission diagnosis based on chemical lipase elevation of 1276] without evidence of acute cholecystitis according to consultation by the 2 clinicians.  [his CT scan did not show any pancreatitis but suggested he had cholecystitis].  He has a small stone in the gallbladder neck without any biliary dilatation.  Aggressive ICU medical management has been recommended.  Growing klebisella in blood    Significant Hospital Events   05/07/2019 0 admit 5/30- GI consult 5/31 - CCS consult 5/31 - ccm consult and move to ICU.  CT scan IMPRESSION: 1. Cholelithiasis with gallbladder wall thickening and pericholecystic fluid is concerning for ACUTE CHOLECYSTITIS. No significant gallbladder distension. 2. Common bile duct is normal.  No evidence of  pancreatitis.  Consults:  5/30- GI consult 5/31 - CCS consult 5/31 - ccm consult and move to ICU  Procedures:  05/11/2019 placement of percutaneous cholecystostomy drain per interventional radiology  Significant Diagnostic Tests:  May 07, 2019 right upper quadrant ultrasound IMPRESSION: 1. 9 mm stone versus tumefactive sludge within the gallbladder neck. No sonographic features to suggest acute cholecystitis. 2. No biliary dilatation. 3. Increased echogenicity within the hepatic parenchyma with question of subtle nodularity of the hepatic contour, which could reflect sequelae of cirrhosis and/or steatosis.   May 09, 2019 CT scan abdomen - IMPRESSION: 1. Cholelithiasis with gallbladder wall thickening and pericholecystic fluid is concerning for ACUTE CHOLECYSTITIS. No significant gallbladder distension. 2. Common bile duct is normal.  No evidence of pancreatitis.  Micro Data:   Blood culture May 07, 2019 -growing Klebsiella pneumoniae COVID-19 and respiratory virus panel May 07, 2019-negative  Antimicrobials:  Vancomycin -May 30 Cefepime May 29>> Flagyl -May 30>>6/3  Interim history/subjective:  Denies abdominal pain. Thirsty but not hungry.  Objective   Blood pressure (!) 149/72, pulse 64, temperature 98.2 F (36.8 C), temperature source Axillary, resp. rate (!) 21, height  (1.676 m), weight 110.8 kg, SpO2 90 %. CVP:  [11 mmHg] 11 mmHg      Intake/Output Summary (Last 24 hours) at 05/12/2019 0950 Last data filed at 05/12/2019 0800 Gross per 24 hour  Intake 2635.06 ml  Output 2365 ml  Net 270.06 ml   Filed Weights   05/10/19 0500 05/11/19 0416 05/12/19 0135  Weight: 113.9 kg 110.8 kg 110.8 kg    Examination: .General: Obese male who is much more awake but  still somewhat confused HEENT: No JVD or lymphadenopathy is appreciated Neuro: Awake more alert able to interact still some mild confusion CV: s1s2 rrr, no m/r/g PULM: even/non-labored, lungs bilaterally  that is in the bases, and still requiring 6 to 8 L nasal cannula.  Suspected underlying OSA GI: Soft nontender positive bowel sounds percutaneous drain is noted draining bile Extremities: warm/dry, 1+ edema  Skin: no rashes or lesions    LABS    PULMONARY Recent Labs  Lab 05/07/19 2104 05/08/19 1307 05/09/19 0335 05/09/19 1638 05/11/19 2058  PHART 7.437 7.455* 7.380 7.412 7.387  PCO2ART 37.0 34.6 43.3 39.6 42.6  PO2ART 78.0* 71.8* 59.1* 69.2* 193.0*  HCO3 25.0 24.0 24.3 24.7 25.6  TCO2 26  --   --   --  27  O2SAT 96.0 92.9 84.3 92.4 100.0    CBC Recent Labs  Lab 05/10/19 0441 05/11/19 0347 05/11/19 2058 05/12/19 0308  HGB 12.9* 12.5* 11.9* 11.9*  HCT 41.6 40.8 35.0* 39.1  WBC 7.2 8.1  --  8.2  PLT 144* 166  --  151    COAGULATION Recent Labs  Lab 05/10/19 0441  INR 1.3*    CARDIAC   Recent Labs  Lab 05/09/19 1830 05/10/19 0441 05/10/19 2145 05/11/19 0347 05/11/19 0947  TROPONINI 0.11* 0.15* 0.07* 0.08* 0.07*   No results for input(s): PROBNP in the last 168 hours.   CHEMISTRY Recent Labs  Lab 05/09/19 0408 05/10/19 0441 05/10/19 2145 05/11/19 0347 05/11/19 2058 05/12/19 0308  NA 147* 152* 154* 158* 163* 160*  K 3.5 3.5 3.5 3.5 3.4* 3.2*  CL 110 117* 123* 123*  --  125*  CO2 23 22 23 25   --  25  GLUCOSE 353* 239* 235* 193*  --  215*  BUN 18 29* 35* 35*  --  30*  CREATININE 1.33* 1.72* 1.52* 1.65*  --  1.36*  CALCIUM 8.2* 8.4* 7.8* 8.2*  --  8.3*  MG 1.7 2.8* 2.6* 2.7*  --  2.6*  PHOS  --   --   --   --   --  1.6*   Estimated Creatinine Clearance: 68.3 mL/min (A) (by C-G formula based on SCr of 1.36 mg/dL (H)).   LIVER Recent Labs  Lab 05/07/19 2052 05/08/19 0513 05/09/19 0408 05/10/19 0441 05/11/19 0347  AST 309* 532* 334* 230* 169*  ALT 194* 414* 460* 358* 262*  ALKPHOS 86 75 87 84 85  BILITOT 2.3* 3.5* 5.1* 4.1* 3.3*  PROT 6.3* 6.2* 6.6 6.6 6.2*  ALBUMIN 3.3* 3.0* 2.8* 2.6* 2.4*  INR  --   --   --  1.3*  --       INFECTIOUS Recent Labs  Lab 05/08/19 1435 05/09/19 0408 05/09/19 0415 05/10/19 0441 05/11/19 0347  LATICACIDVEN 3.1*  --  1.9 1.6  --   PROCALCITON  --  7.98  --  6.28 4.00     ENDOCRINE CBG (last 3)  Recent Labs    05/11/19 1522 05/11/19 1933 05/12/19 0748  GLUCAP 169* 164* 223[facility] Problem list   N/A  Assessment & Plan:   Critically ill due to agitated delirium requiring titration of dexmedetomidine to achieve adequate control of agitation to prevent self-harm. Currently NPO, limits options for sedative medications. Plan: Precedex discontinued 05/12/2019 Much more awake on 05/12/2019 Monitor mental status he does have some confusion.  Critically ill due to possible airway compromise due to body habitus and abdominal distention and possible undiagnosed OSA. At high risk for  oversedation. Remains on high-flow O2 Plan: Discontinue Precedex Monitor O2 saturation Consider nocturnal CPAP May be ready to transition to stepdown status within 24 hours.  K.pneumoniae bacteremia - likely from cholangitis related to biliary obstruction.  Currently no signs of obstruction requiring intervention. Stone has passed.  Plan: Continue antimicrobial therapy for 10 days Dc flagyl 05/12/19  Acute gallstone (microlithiasis) related pancreatitis. Lipase had normalized Percutaneous abdominal cholecystostomy drain placed by interventional radiology 05/11/2019 No indication for surgical intervention. Plan: Advance diet to clear liquid 05/12/1999 Continue to monitor percutaneous drain   Hyperglycemia Hyponatremia Hypophosphatemia Hypokalemia CBG (last 3)  Recent Labs    05/11/19 1522 05/11/19 1933 05/12/19 0748  GLUCAP 169* 164* 223*   Recent Labs  Lab 05/11/19 0347 05/11/19 2058 05/12/19 0308  NA 158* 163* 160*   Plan: Change IV fluids to D5 50 cc an hour for hypernatremia Add low-dose Levemir for rising glucose Continue monitor sliding scale as we decrease  his IV fluids he begins taking clear liquids Replete potassium and phosphorus  Prior CAD Mild demand related troponin rise Plan: Continue to monitor  AKI  Lab Results  Component Value Date   CREATININE 1.36 (H) 05/12/2019   CREATININE 1.65 (H) 05/11/2019   CREATININE 1.52 (H) 05/10/2019   CREATININE 0.72 04/18/2015   CREATININE 0.81 03/22/2015    No indication for hemodialysis Plan: Note renal function is improving with IV fluids Decrease IV fluids to 50 cc an hour 05/11/2019 renal ultrasound was unremarkable     Best practice:  Diet: Clear liquids Pain/Anxiety/Delirium protocol (if indicated): off precedex. PRN haloperidol  VAP protocol (if indicated): HOB > 30 deg DVT prophylaxis: lovenox GI prophylaxis: not intubated Glucose control: on basal/bolus insulin. Increased Levemir . Mobility: bed rest Code Status: full Family Communication: Wife updated 05/12/2019 per RN.  He may transfer back to stepdown in the next 24 hours Disposition: 05/12/2019 he is now off Precedex.  Will require high flow FiO2 at 8 L per nasal cannula.  If we can decrease his FiO2 needs to keep him off Precedex we will transfer back to stepdown unit and to [facility] service on 05/13/2019   CRITICAL CARE App CCT 30 min    Brett Canales Reagen Haberman ACNP [facility] PCCM Pager [phone] till 1 pm If no answer page [phone] 05/12/2019, 9:50 AM

## 2019-05-12 NOTE — Progress Notes (Signed)
Inpatient Diabetes Program Recommendations  AACE/ADA: New Consensus Statement on Inpatient Glycemic Control (2015)  Target Ranges:  Prepandial:   less than 140 mg/dL      Peak postprandial:   less than 180 mg/dL (1-2 hours)      Critically ill patients:  140 - 180 mg/dL   Results for KONSTANTINOS, GIANG (MRN [id number]) as of 05/12/2019 12:43  Ref. Range 05/11/2019 11:31 05/11/2019 15:22 05/11/2019 19:33 05/12/2019 07:48 05/12/2019 11:33  Glucose-Capillary Latest Ref Range: 70 - 99 mg/dL 974 (H) 163 (H) 845 (H) 223 (H) 234 (H)    Review of Glycemic Control  Diabetes history: DM 2 Outpatient Diabetes medications: Glimepiride 4 mg bid, NPH 40 units qam, 55 units qpm, Novolin Regular SSI  Current orders for Inpatient glycemic control: Levemir 30 units Daily, Novolog 0-15 units tid, Novolog 0-5 units qhs  Inpatient Diabetes Program Recommendations:    Glucose trends increased, now in the 200's. Consider increasing Levemir to 35 units.  Thanks, Christena Deem RN, MSN, BC-ADM Inpatient Diabetes Coordinator Team Pager [phone] (8a-5p)

## 2019-05-12 NOTE — Progress Notes (Signed)
eLink Physician-Brief Progress Note Patient Name: Johnathan Keith DOB: [birth year] MRN: [id number]   Date of Service  05/12/2019  HPI/Events of Note  Multiple issues: 1. Hypokalemia/Hypophosphatemia - K+ = 3.2, PO4--- = 1.6 and Creatinine = 1.36 and 2. Na+ = 163 --> 160. Na+ correcting.   eICU Interventions  Will order: 1. Replace K+ and PO4---/ 2. BMP and PO4--- level at 12 noon.      Intervention Category Major Interventions: Electrolyte abnormality - evaluation and management  Sommer,Steven Eugene 05/12/2019, 4:42 AM

## 2019-05-13 ENCOUNTER — Inpatient Hospital Stay (HOSPITAL_COMMUNITY): Payer: Medicare HMO

## 2019-05-13 DIAGNOSIS — K819 Cholecystitis, unspecified: Secondary | ICD-10-CM

## 2019-05-13 LAB — CBC WITH DIFFERENTIAL/PLATELET
Abs Immature Granulocytes: 0.05 10*3/uL (ref 0.00–0.07)
Basophils Absolute: 0 10*3/uL (ref 0.0–0.1)
Basophils Relative: 0 %
Eosinophils Absolute: 0.2 10*3/uL (ref 0.0–0.5)
Eosinophils Relative: 2 %
HCT: 37.8 % — ABNORMAL LOW (ref 39.0–52.0)
Hemoglobin: 11.6 g/dL — ABNORMAL LOW (ref 13.0–17.0)
Immature Granulocytes: 1 %
Lymphocytes Relative: 13 %
Lymphs Abs: 1.2 10*3/uL (ref 0.7–4.0)
MCH: 29 pg (ref 26.0–34.0)
MCHC: 30.7 g/dL (ref 30.0–36.0)
MCV: 94.5 fL (ref 80.0–100.0)
Monocytes Absolute: 0.5 10*3/uL (ref 0.1–1.0)
Monocytes Relative: 6 %
Neutro Abs: 7.3 10*3/uL (ref 1.7–7.7)
Neutrophils Relative %: 78 %
Platelets: 147 10*3/uL — ABNORMAL LOW (ref 150–400)
RBC: 4 MIL/uL — ABNORMAL LOW (ref 4.22–5.81)
RDW: 17.7 % — ABNORMAL HIGH (ref 11.5–15.5)
WBC: 9.2 10*3/uL (ref 4.0–10.5)
nRBC: 0 % (ref 0.0–0.2)

## 2019-05-13 LAB — BASIC METABOLIC PANEL
Anion gap: 10 (ref 5–15)
BUN: 21 mg/dL — ABNORMAL HIGH (ref 6–20)
CO2: 26 mmol/L (ref 22–32)
Calcium: 7.8 mg/dL — ABNORMAL LOW (ref 8.9–10.3)
Chloride: 113 mmol/L — ABNORMAL HIGH (ref 98–111)
Creatinine, Ser: 1.07 mg/dL (ref 0.61–1.24)
GFR calc Af Amer: >60 mL/min (ref >60)
GFR calc non Af Amer: >60 mL/min (ref >60)
Glucose, Bld: 403 mg/dL — ABNORMAL HIGH (ref 70–99)
Potassium: 3 mmol/L — ABNORMAL LOW (ref 3.5–5.1)
Sodium: 149 mmol/L — ABNORMAL HIGH (ref 135–145)

## 2019-05-13 LAB — GLUCOSE, CAPILLARY
Glucose-Capillary: 180 mg/dL — ABNORMAL HIGH (ref 70–99)
Glucose-Capillary: 272 mg/dL — ABNORMAL HIGH (ref 70–99)
Glucose-Capillary: 164 mg/dL — ABNORMAL HIGH (ref 70–99)
Glucose-Capillary: 148 mg/dL — ABNORMAL HIGH (ref 70–99)

## 2019-05-13 LAB — MAGNESIUM: Magnesium: 2.2 mg/dL (ref 1.7–2.4)

## 2019-05-13 IMAGING — DX PORTABLE CHEST - 1 VIEW
1 series · 1 of 1 positions shown · non-contrast
Comparison: [DATE]

CLINICAL DATA: Abnormal respirations

EXAM:
PORTABLE CHEST 1 VIEW

[chest ap]
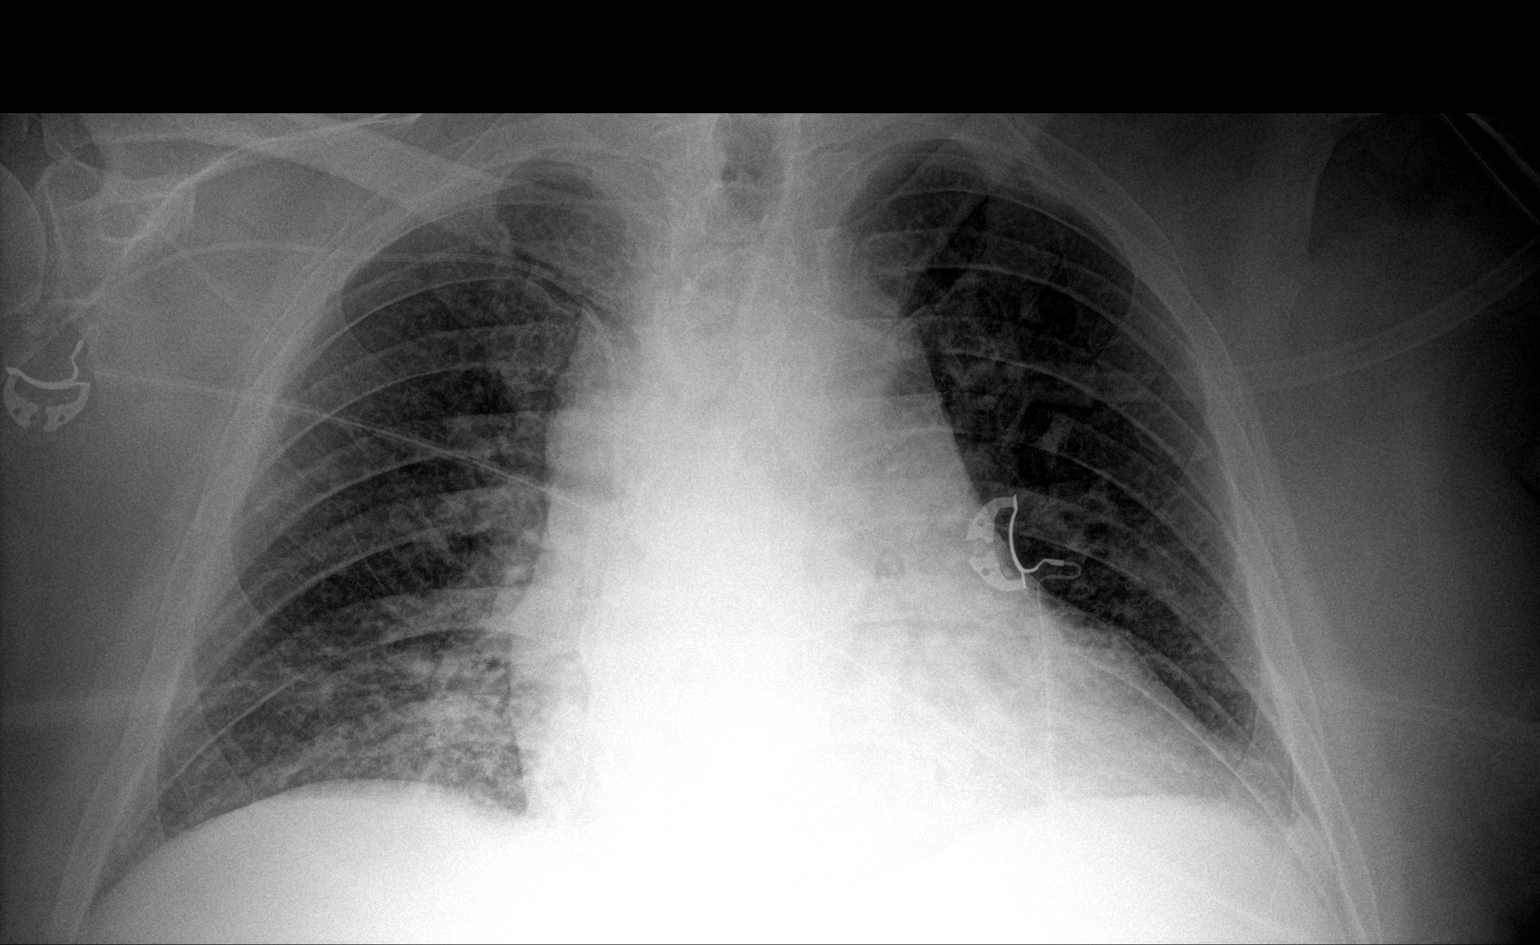

[1 of 1 positions shown; findings below may reference images not displayed]

FINDINGS: Cardiac shadow is stable but accentuated by the portable technique.
Right-sided PICC line is deep within the right atrium. The overall
inspiratory effort is again poor. Mild central vascular congestion
is again noted. No focal infiltrate is seen.
IMPRESSION: Mild CHF.

## 2019-05-13 MED ORDER — SODIUM CHLORIDE 0.9 % IV SOLN
3.0000 g | Freq: Three times a day (TID) | INTRAVENOUS | Status: DC
  Administered 2019-05-13 – 2019-05-17 (×12): 3 g via INTRAVENOUS
  Filled 2019-05-13 (×17): qty 3

## 2019-05-13 MED ORDER — POTASSIUM CHLORIDE 10 MEQ/50ML IV SOLN
10.0000 meq | INTRAVENOUS | Status: AC
  Administered 2019-05-13 (×4): 10 meq via INTRAVENOUS
  Filled 2019-05-13 (×4): qty 50

## 2019-05-13 MED ORDER — ALUM & MAG HYDROXIDE-SIMETH 200-200-20 MG/5ML PO SUSP
30.0000 mL | ORAL | Status: DC | PRN
  Administered 2019-05-13: 30 mL via ORAL
  Filled 2019-05-13: qty 30

## 2019-05-13 NOTE — Progress Notes (Signed)
[facility] ADULT ICU REPLACEMENT PROTOCOL FOR AM LAB REPLACEMENT ONLY  The patient does apply for the [facility] Adult ICU Electrolyte Replacment Protocol based on the criteria listed below:   1. Is GFR >/= 40 ml/min? Yes.    Patient's GFR today is >60 2. Is urine output >/= 0.5 ml/kg/hr for the last 6 hours? Yes.   Patient's UOP is .9 ml/kg/hr 3. Is BUN < 60 mg/dL? Yes.    Patient's BUN today is 21 4. Abnormal electrolyte(s): K-3.0 5. Ordered repletion with:per protocol 6. If a panic level lab has been reported, has the CCM MD in charge been notified? Yes.  .   Physician:  Dr. Janne Lab, Dixon Boos 05/13/2019 5:41 AM

## 2019-05-13 NOTE — Progress Notes (Signed)
[facility] Progress Note      Assessment/Plan CAD GERD Hx of MI HLD HTN PVD Hx of stroke, left sided weakness  Sepsis  Bacteremia - Klebsiella pneumoniae Gallstone pancreatitis with probable acute cholecystitis -S/P perc chole drain placement by IR, 06/02 - drain bilious, cultures show Enterococcus faecalis - LFT's and tbili have been trending down, am CMP   - lipase 17 on 06/01 - we will follow  FEN:reg diet okay from our standpoint but will defer to CCM VTE: SCD's, lovenox ZO:XWRUEAVW:maxepime 05/29-06/01 Flagyl 05/30-06/09Ancef 06/01-06/09 Foley:yes per medicine Follow up:TBD  Dispo: mental status more improved today. Okay for a reg diet from a surgical standpoint. Will plan for lap chole as an outpt.     LOS: 5 days    Subjective: CC: no complaints  Pt states no pain or nausea. He knows where he is and what year it is. He knows he is here because of his gallbladder.   Objective: Vital signs in last 24 hours: Temp:  [97.4 F (36.3 C)-98 F (36.7 C)] 97.5 F (36.4 C) (06/04 0331) Pulse Rate:  [64-93] 89 (06/04 0613) Resp:  [14-22] 17 (06/04 0613) BP: (141-187)/(67-92) 145/77 (06/04 0613) SpO2:  [90 %-98 %] 95 % (06/04 09810613) Weight:  [109.6 kg] 109.6 kg (06/04 0412) Last BM Date: 05/12/19  Intake/Output from previous day: 06/03 0701 - 06/04 0700 In: 2769.6 [P.O.:600; I.V.:1114.2; IV Piggyback:1055.4] Out: 3228 [Urine:2900; Drains:325; Stool:3] Intake/Output this shift: No intake/output data recorded.  PE: Gen:NAD, more alert today.  HEENT: PERRL, mucus membranes moist Pulm:rate and effort normal Abd: Soft,obese, mild distention,mild guarding with palpation of RUQ. No peritonitis  Skin: warm and dry Neuro: following commands, alert and oriented x 3   Anti-infectives: Anti-infectives (From admission, onward)   Start     Dose/Rate Route Frequency Ordered Stop   05/10/19 1600  vancomycin (VANCOCIN) 1,250 mg in sodium  chloride 0.9 % 250 mL IVPB  Status:  Discontinued     1,250 mg 166.7 mL/hr over 90 Minutes Intravenous Every 24 hours 05/09/19 1453 05/09/19 1801   05/10/19 1400  ceFAZolin (ANCEF) IVPB 1 g/50 mL premix  Status:  Discontinued     1 g 100 mL/hr over 30 Minutes Intravenous Every 8 hours 05/10/19 0910 05/10/19 1017   05/10/19 1400  ceFAZolin (ANCEF) IVPB 2g/100 mL premix     2 g 200 mL/hr over 30 Minutes Intravenous Every 8 hours 05/10/19 1017 05/18/19 1359   05/08/19 1730  metroNIDAZOLE (FLAGYL) IVPB 500 mg  Status:  Discontinued     500 mg 100 mL/hr over 60 Minutes Intravenous Every 8 hours 05/08/19 1657 05/12/19 1057   05/08/19 1000  vancomycin (VANCOCIN) 1,750 mg in sodium chloride 0.9 % 500 mL IVPB  Status:  Discontinued     1,750 mg 250 mL/hr over 120 Minutes Intravenous Every 12 hours 05/07/19 2153 05/09/19 1453   05/08/19 0600  ceFEPIme (MAXIPIME) 2 g in sodium chloride 0.9 % 100 mL IVPB  Status:  Discontinued     2 g 200 mL/hr over 30 Minutes Intravenous Every 8 hours 05/07/19 2153 05/10/19 0910   05/07/19 2130  ceFEPIme (MAXIPIME) 2 g in sodium chloride 0.9 % 100 mL IVPB     2 g 200 mL/hr over 30 Minutes Intravenous  Once 05/07/19 2115 05/07/19 2232   05/07/19 2130  vancomycin (VANCOCIN) IVPB 1000 mg/200 mL premix  Status:  Discontinued     1,000 mg 200 mL/hr over 60 Minutes Intravenous  Once 05/07/19 2115 05/07/19  2120   05/07/19 2130  vancomycin (VANCOCIN) 1,500 mg in sodium chloride 0.9 % 500 mL IVPB     1,500 mg 250 mL/hr over 120 Minutes Intravenous  Once 05/07/19 2120 05/08/19 0040      Lab Results:  Recent Labs    05/12/19 0308 05/13/19 0425  WBC 8.2 9.2  HGB 11.9* 11.6*  HCT 39.1 37.8*  PLT 151 147*   BMET Recent Labs    05/12/19 1140 05/13/19 0425  NA 150* 149*  K 3.3* 3.0*  CL 118* 113*  CO2 24 26  GLUCOSE 451* 403*  BUN 26* 21*  CREATININE 1.15 1.07  CALCIUM 7.9* 7.8*   PT/INR No results for input(s): LABPROT, INR in the last 72 hours. CMP      Component Value Date/Time   NA 149 (H) 05/13/2019 0425   NA 144 09/07/2018 0949   K 3.0 (L) 05/13/2019 0425   CL 113 (H) 05/13/2019 0425   CO2 26 05/13/2019 0425   GLUCOSE 403 (H) 05/13/2019 0425   BUN 21 (H) 05/13/2019 0425   BUN 22 09/07/2018 0949   CREATININE 1.07 05/13/2019 0425   CREATININE 0.72 04/18/2015 1140   CALCIUM 7.8 (L) 05/13/2019 0425   PROT 6.2 (L) 05/11/2019 0347   PROT 7.3 08/27/2017 1039   ALBUMIN 2.4 (L) 05/11/2019 0347   ALBUMIN 4.5 08/27/2017 1039   AST 169 (H) 05/11/2019 0347   ALT 262 (H) 05/11/2019 0347   ALKPHOS 85 05/11/2019 0347   BILITOT 3.3 (H) 05/11/2019 0347   BILITOT 0.2 08/27/2017 1039   GFRNONAA >60 05/13/2019 0425   GFRAA >60 05/13/2019 0425   Lipase     Component Value Date/Time   LIPASE 17 05/10/2019 0441    Studies/Results: US Renal  Result Date: 05/11/2019 CLINICAL DATA:  Renal failure EXAM: RENAL / URINARY TRACT ULTRASOUND COMPLETE COMPARISON:  CT from 05/09/2019 FINDINGS: Right Kidney: Renal measurements: 13.3 x 6.3 x 6.6 cm. = volume: 290 mL . Echogenicity within normal limits. No mass or hydronephrosis visualized. Left Kidney: Renal measurements: 12.1 x 6.8 x 5.9 cm = volume: 254 mL. Echogenicity within normal limits. No mass or hydronephrosis visualized. Bladder: Decompressed by Foley catheter. IMPRESSION: Normal appearing kidneys bilaterally. Electronically Signed   By: Alcide Clever M.D.   On: 05/11/2019 12:44   Ir Perc Cholecystostomy  Result Date: 05/11/2019 INDICATION: Sepsis, cholecystitis EXAM: ULTRASOUND FLUOROSCOPIC 10 FRENCH PERCUTANEOUS CHOLECYSTOSTOMY MEDICATIONS: Patient is already receiving IV antibiotics as an inpatient ANESTHESIA/SEDATION: 100 mg Ketamine provided by the critical care medicine staff Moderate Sedation Time: None. The patient's level of consciousness and vital signs were monitored continuously by radiology nursing throughout the procedure under my direct supervision. FLUOROSCOPY TIME:  Fluoroscopy Time: 0  minutes 42 seconds (28 mGy). COMPLICATIONS: None immediate. PROCEDURE: Informed written consent was obtained from the patient's family after a thorough discussion of the procedural risks, benefits and alternatives. All questions were addressed. Maximal Sterile Barrier Technique was utilized including caps, mask, sterile gowns, sterile gloves, sterile drape, hand hygiene and skin antiseptic. A timeout was performed prior to the initiation of the procedure. Preliminary ultrasound performed. The gallbladder was localized in the right upper quadrant. This was correlated with the CT. Overlying skin marked. Under sterile conditions and local anesthesia, ultrasound percutaneous transhepatic needle access performed with a 21 gauge needle. Needle position confirmed with ultrasound. Images obtained for documentation for ultrasound access. There was return of green bile. Sample sent for culture. Guidewire inserted followed by the Accustick dilator set. Amplatz guidewire  inserted followed by tract dilatation to insert a 10 [place] drain. Drain catheter position confirmed with ultrasound and fluoroscopy. Images obtained for documentation. 30 cc bile aspirated. Catheter connected to external gravity drainage bag. Catheter secured with Prolene suture and a sterile dressing. No immediate complication. Patient tolerated the procedure well. IMPRESSION: Successful ultrasound fluoroscopic 10 French percutaneous transhepatic cholecystostomy. Bile culture sent. Electronically Signed   By: Judie Petit.  Shick M.D.   On: 05/11/2019 15:23   Dg Chest Port 1 View  Result Date: 05/13/2019 CLINICAL DATA:  Abnormal respirations EXAM: PORTABLE CHEST 1 VIEW COMPARISON:  05/12/2019 FINDINGS: Cardiac shadow is stable but accentuated by the portable technique. Right-sided PICC line is deep within the right atrium. The overall inspiratory effort is again poor. Mild central vascular congestion is again noted. No focal infiltrate is seen. IMPRESSION: Mild CHF.  Electronically Signed   By: Alcide Clever M.D.   On: 05/13/2019 07:05   Dg Chest Port 1 View  Result Date: 05/12/2019 CLINICAL DATA:  Respiratory failure EXAM: PORTABLE CHEST 1 VIEW COMPARISON:  05/09/2019 FINDINGS: Cardiac shadow remains enlarged but accentuated by the portable technique. Right-sided PICC line is noted in satisfactory position. Poor inspiratory effort with congestive failure is again identified but improved when compared with the prior exam. No new focal infiltrate is seen. No bony abnormality is noted. IMPRESSION: Tubes and lines as described. Changes of congestive failure but improved from the prior study. Electronically Signed   By: Alcide Clever M.D.   On: 05/12/2019 07:25      Jerre Simon , [facility] Surgery 05/13/2019, 7:53 AM  Pager: [phone] Mon-Wed, Friday 7:00am-4:30pm Thurs 7am-11:30am  Consults: [phone]

## 2019-05-13 NOTE — Progress Notes (Addendum)
NAME:  Johnathan Keith, MRN:  [id number], DOB:  01/11/1961, LOS: 5 ADMISSION DATE:  05/07/2019, CONSULTATION DATE:  05/09/2019 REFERRING MD:  Dr [name] Chimes, CHIEF COMPLAINT:  Acute encephalopathy, SIRs, gallstone pancreatitis   Brief History   See belo  History of present illness   58 year old obese male with acid reflux, hypertension, coronary artery disease and diabetes.  He arrived in the emergency department May 07, 2019 because of fever, chills, generalized weakness and confusion associated with nausea and vomiting.  Initially fever was 100.3 and tachycardic.  His initial lactic acid was 3.9.  His COVID-19 test was negative.  According to the bedside nurse for most of the day on May 08, 2019 he had hypoactive delirium and significant sirs physiology.  However today they feel he is somewhat improved with improved AKI and resolved lactic acidosis and improving LFT and HR and RR.  However, because of his agitated delirium  has been difficult to control and MEWS score is 6. Therefore critical care medicine has been consulted.  Evaluation is shown that GI and [facility] have consulted on him and the unifying diagnosis is 1 of gallstone pancreatitis [admission diagnosis based on chemical lipase elevation of 1276] without evidence of acute cholecystitis according to consultation by the 2 clinicians.  [his CT scan did not show any pancreatitis but suggested he had cholecystitis].  He has a small stone in the gallbladder neck without any biliary dilatation.  Aggressive ICU medical management has been recommended.  Growing klebisella in blood    Significant Hospital Events   05/07/2019 0 admit 5/30- GI consult 5/31 - CCS consult 5/31 - ccm consult and move to ICU.  CT scan IMPRESSION: 1. Cholelithiasis with gallbladder wall thickening and pericholecystic fluid is concerning for ACUTE CHOLECYSTITIS. No significant gallbladder distension. 2. Common bile duct is normal.  No evidence of  pancreatitis.  Consults:  5/30- GI consult 5/31 - CCS consult 5/31 - ccm consult and move to ICU  Procedures:  05/11/2019 placement of percutaneous cholecystostomy drain per interventional radiology  Significant Diagnostic Tests:  May 07, 2019 right upper quadrant ultrasound IMPRESSION: 1. 9 mm stone versus tumefactive sludge within the gallbladder neck. No sonographic features to suggest acute cholecystitis. 2. No biliary dilatation. 3. Increased echogenicity within the hepatic parenchyma with question of subtle nodularity of the hepatic contour, which could reflect sequelae of cirrhosis and/or steatosis.   May 09, 2019 CT scan abdomen - IMPRESSION: 1. Cholelithiasis with gallbladder wall thickening and pericholecystic fluid is concerning for ACUTE CHOLECYSTITIS. No significant gallbladder distension. 2. Common bile duct is normal.  No evidence of pancreatitis.  Micro Data:   Blood culture May 07, 2019 -growing Klebsiella pneumoniae COVID-19 and respiratory virus panel May 07, 2019-negative 6/2 gb ENTEROCOCCUS FAECALI  Antimicrobials:  Vancomycin -May 30 Cefepime May 29>> off Flagyl -May 30>>6/3 Ancef 05/10/2011>>6/4 Unasyn 6/4>> Interim history/subjective:  More awake stable will transfer to floor  Objective   Blood pressure (!) 151/85, pulse 94, temperature 98 F (36.7 C), temperature source Oral, resp. rate 16, height 5\' 6"  (1.676 m), weight 109.6 kg, SpO2 94 %.        Intake/Output Summary (Last 24 hours) at 05/13/2019 0939 Last data filed at 05/13/2019 0600 Gross per 24 hour  Intake 2595.77 ml  Output 2898 ml  Net -302.23 ml   Filed Weights   05/11/19 0416 05/12/19 0135 05/13/19 0412  Weight: 110.8 kg 110.8 kg 109.6 kg    Examination: General: Much more awake and alert  follows commands HEENT: Oral pharynx is unremarkable Neuro: Awake follows commands appears orientated  CV: s1s2 rrr, no m/r/g PULM: even/non-labored, lungs bilaterally diminished in the  bases XQ:JJHE, non-tender, bsx4 active  Extremities: warm/dry, 1+ edema  Skin: no rashes or lesions    LABS    PULMONARY Recent Labs  Lab 05/07/19 2104 05/08/19 1307 05/09/19 0335 05/09/19 1638 05/11/19 2058  PHART 7.437 7.455* 7.380 7.412 7.387  PCO2ART 37.0 34.6 43.3 39.6 42.6  PO2ART 78.0* 71.8* 59.1* 69.2* 193.0*  HCO3 25.0 24.0 24.3 24.7 25.6  TCO2 26  --   --   --  27  O2SAT 96.0 92.9 84.3 92.4 100.0    CBC Recent Labs  Lab 05/11/19 0347 05/11/19 2058 05/12/19 0308 05/13/19 0425  HGB 12.5* 11.9* 11.9* 11.6*  HCT 40.8 35.0* 39.1 37.8*  WBC 8.1  --  8.2 9.2  PLT 166  --  151 147*    COAGULATION Recent Labs  Lab 05/10/19 0441  INR 1.3*    CARDIAC   Recent Labs  Lab 05/09/19 1830 05/10/19 0441 05/10/19 2145 05/11/19 0347 05/11/19 0947  TROPONINI 0.11* 0.15* 0.07* 0.08* 0.07*   No results for input(s): PROBNP in the last 168 hours.   CHEMISTRY Recent Labs  Lab 05/10/19 0441 05/10/19 2145 05/11/19 0347 05/11/19 2058 05/12/19 0308 05/12/19 1140 05/13/19 0425  NA 152* 154* 158* 163* 160* 150* 149*  K 3.5 3.5 3.5 3.4* 3.2* 3.3* 3.0*  CL 117* 123* 123*  --  125* 118* 113*  CO2 22 23 25   --  25 24 26   GLUCOSE 239* 235* 193*  --  215* 451* 403*  BUN 29* 35* 35*  --  30* 26* 21*  CREATININE 1.72* 1.52* 1.65*  --  1.36* 1.15 1.07  CALCIUM 8.4* 7.8* 8.2*  --  8.3* 7.9* 7.8*  MG 2.8* 2.6* 2.7*  --  2.6*  --  2.2  PHOS  --   --   --   --  1.6* 2.3*  --    Estimated Creatinine Clearance: 86.3 mL/min (by C-G formula based on SCr of 1.07 mg/dL).   LIVER Recent Labs  Lab 05/07/19 2052 05/08/19 0513 05/09/19 0408 05/10/19 0441 05/11/19 0347  AST 309* 532* 334* 230* 169*  ALT 194* 414* 460* 358* 262*  ALKPHOS 86 75 87 84 85  BILITOT 2.3* 3.5* 5.1* 4.1* 3.3*  PROT 6.3* 6.2* 6.6 6.6 6.2*  ALBUMIN 3.3* 3.0* 2.8* 2.6* 2.4*  INR  --   --   --  1.3*  --      INFECTIOUS Recent Labs  Lab 05/08/19 1435 05/09/19 0408 05/09/19 0415  05/10/19 0441 05/11/19 0347  LATICACIDVEN 3.1*  --  1.9 1.6  --   PROCALCITON  --  7.98  --  6.28 4.00     ENDOCRINE CBG (last 3)  Recent Labs    05/12/19 1548 05/12/19 2208 05/13/19 0755  GLUCAP 189* 167* 180[facility] Problem list   N/A  Assessment & Plan:   Critically ill due to agitated delirium requiring titration of dexmedetomidine to achieve adequate control of agitation to prevent self-harm.  Plan: Agitation is resolved much more awake..  Critically ill due to possible airway compromise due to body habitus and abdominal distention and possible undiagnosed OSA. At high risk for oversedation. Remains on high-flow O2 Plan: Off Precedex He was down to 5 L nasal cannula We will continue to try nocturnal CPAP Transition to telemetry floor  K.pneumoniae bacteremia - likely  from cholangitis related to biliary obstruction.  Currently no signs of obstruction requiring intervention. Stone has passed.  Plan: Start Unasyn 6/4  for total of 10 days currently on day 5 abx   Acute gallstone (microlithiasis) related pancreatitis. Lipase had normalized Percutaneous abdominal cholecystostomy drain placed by interventional radiology 05/11/2019 No indication for surgical intervention. Plan: Continue clear liquid diet Per drain per interventional radiology was 60 cc of bilious drainage noted 05/13/2019   Hyperglycemia Hyponatremia Hypophosphatemia Hypokalemia CBG (last 3)  Recent Labs    05/12/19 1548 05/12/19 2208 05/13/19 0755  GLUCAP 189* 167* 180*   Recent Labs  Lab 05/12/19 0308 05/12/19 1140 05/13/19 0425  NA 160* 150* 149*   Recent Labs  Lab 05/12/19 0308 05/12/19 1140 05/13/19 0425  K 3.2* 3.3* 3.0*    Plan: Continue IV fluids D5W at 50 an hour and monitor hypernatremia On low-dose Levemir for glucose Sliding scale insulin Monitor replete potassium as needed received IV potassium on 05/13/2019  Prior CAD Mild demand related troponin  rise Plan: Continue to monitor with telemetry  AKI  Lab Results  Component Value Date   CREATININE 1.07 05/13/2019   CREATININE 1.15 05/12/2019   CREATININE 1.36 (H) 05/12/2019   CREATININE 0.72 04/18/2015   CREATININE 0.81 03/22/2015    No indication for hemodialysis Plan: Renal function continues to improve Can KVO IV fluids 05/11/2019 renal ultrasound was unremarkable     Best practice:  Diet: Clear liquids Pain/Anxiety/Delirium protocol (if indicated): off precedex. PRN haloperidol  VAP protocol (if indicated): HOB > 30 deg DVT prophylaxis: lovenox GI prophylaxis: not intubated Glucose control: on basal/bolus insulin. Increased Levemir . Mobility: bed rest Code Status: full Family Communication: Wife updated 05/12/2019 per RN.  He may transfer back to stepdown in the next 24 hours Disposition: 05/13/2019 he is off sedation.  Is more interactive.  Wears O2 24/7 at home he is supposed to wear CPAP machine but does not.  He no longer needs intensive care therefore moved to floor and to try a service.   CRITICAL CARE App CCT 30 min    Brett CanalesSteve Davyn Morandi ACNP [facility] P[facility] Pager [phone] till 1 pm If no answer page [phone] 05/13/2019, 9:39 AM

## 2019-05-13 NOTE — TOC Initial Note (Signed)
Transition of Care [facility]) - Initial/Assessment Note    Patient Details  Name: Johnathan Keith MRN: [id number] Date of Birth: October 17, 1961  Transition of Care [facility]) CM/SW Contact:    Leone Haven, RN Phone Number: 05/13/2019, 4:38 PM  Clinical Narrative:                 From home with wife, AMS, fever, Covid negative, he has home oxygen 2 liters, cane and a walker. He has no barriers in obtaining medications, he has transportation and he has a PCP.   Expected Discharge Plan: Home/Self Care Barriers to Discharge: No Barriers Identified   Patient Goals and CMS Choice Patient states their goals for this hospitalization and ongoing recovery are:: to get better   Choice offered to / list presented to : NA  Expected Discharge Plan and Services Expected Discharge Plan: Home/Self Care In-house Referral: NA Discharge Planning Services: CM Consult Post Acute Care Choice: NA Living arrangements for the past 2 months: Single Family Home                 DME Arranged: N/A DME Agency: NA       HH Arranged: NA          Prior Living Arrangements/Services Living arrangements for the past 2 months: Single Family Home Lives with:: Spouse Patient language and need for interpreter reviewed:: Yes Do you feel safe going back to the place where you live?: Yes      Need for Family Participation in Patient Care: No (Comment) Care giver support system in place?: Yes (comment) Current home services: DME(walker , cane) Criminal Activity/Legal Involvement Pertinent to Current Situation/Hospitalization: No - Comment as needed  Activities of Daily Living Home Assistive Devices/Equipment: None ADL Screening (condition at time of admission) Patient's cognitive ability adequate to safely complete daily activities?: Yes Is the patient deaf or have difficulty hearing?: No Does the patient have difficulty seeing, even when wearing glasses/contacts?: No Does the patient have difficulty  concentrating, remembering, or making decisions?: Yes Patient able to express need for assistance with ADLs?: Yes Does the patient have difficulty dressing or bathing?: No Independently performs ADLs?: Yes (appropriate for developmental age) Does the patient have difficulty walking or climbing stairs?: Yes Weakness of Legs: Left Weakness of Arms/Hands: Left  Permission Sought/Granted                  Emotional Assessment Appearance:: Appears stated age Attitude/Demeanor/Rapport: Engaged Affect (typically observed): Accepting Orientation: : Oriented to Self, Oriented to Place, Oriented to  Time, Oriented to Situation Alcohol / Substance Use: Not Applicable Psych Involvement: No (comment)  Admission diagnosis:  Confusion [R41.0] Hyperglycemia [R73.9] Elevated liver function tests [R94.5] Acute febrile illness [R50.9] Generalized weakness [R53.1] AKI (acute kidney injury) (HCC) [N17.9] Elevated lactic acid level [R79.89] Patient Active Problem List   Diagnosis Date Noted  . Cholecystitis   . AKI (acute kidney injury) (HCC)   . Fever 05/08/2019  . Transaminitis 05/08/2019  . Serum total bilirubin elevated 05/08/2019  . Sepsis (HCC) 05/08/2019  . Acute pancreatitis 05/08/2019  . Bilateral lower extremity edema 08/18/2018  . History of stroke 04/20/2018  . Sleep apnea 02/27/2017  . Occipital neuralgia of right side 12/18/2016  . CAD -S/P PCI-2016 11/30/2015  . 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 Carotid stenosis 09/13/2015  . Bilateral low back pain without sciatica 09/13/2015  . Carpal tunnel syndrome 07/17/2015  . Lower back pain 06/14/2015  . Abnormal  nuclear cardiac imaging test   . Dyspnea on exertion   . Essential hypertension 11/09/2014  . Hyperlipidemia 10/26/2014  . Carotid artery stenosis- 09/26/2014  . Cerebrovascular accident (CVA) due to thrombosis of right middle cerebral artery (HCC) 07/04/2014  . Type  2 diabetes mellitus with vascular disease (HCC) 07/04/2014   PCP:  Shelle Iron, MD Pharmacy:   [facility] [address] [place] - [address] [place] [address] Phone: [phone] Fax: [phone]     Social Determinants of Health (SDOH) Interventions    Readmission Risk Interventions Readmission Risk Prevention Plan 05/13/2019  Transportation Screening Complete  HRI or Home Care Consult Complete  Social Work Consult for Recovery Care Planning/Counseling Complete  Palliative Care Screening Not Applicable  Medication Review Oceanographer) Complete  Some recent data might be hidden

## 2019-05-13 NOTE — Progress Notes (Signed)
Called and updated wife on patient transferring to new floor/room.

## 2019-05-13 NOTE — Progress Notes (Signed)
IR rounding note via telephone per new regulations. Spoke with Lorene Dy, RN.  Patient with history of sepsis secondary to cholecystitis s/p percutaneous cholecystostomy drain placement 05/10/2001 by Dr. Miles Costain.  RN states that cholecystostomy site c/d/i with approximately 60 mL of thick, bilious brown/green fluid in gravity bag.  Continue with current drain management- continue with Qshift flushes/monitor of output. Appreciate and agree with CCS/CCM management. IR to follow.   Waylan Boga Kirat Mezquita, PA-C 05/13/2019, 11:14 AM

## 2019-05-14 LAB — COMPREHENSIVE METABOLIC PANEL
ALT: 52 U/L — ABNORMAL HIGH (ref 0–44)
AST: 33 U/L (ref 15–41)
Albumin: 2.3 g/dL — ABNORMAL LOW (ref 3.5–5.0)
Alkaline Phosphatase: 77 U/L (ref 38–126)
Anion gap: 7 (ref 5–15)
BUN: 15 mg/dL (ref 6–20)
CO2: 28 mmol/L (ref 22–32)
Calcium: 7.5 mg/dL — ABNORMAL LOW (ref 8.9–10.3)
Chloride: 108 mmol/L (ref 98–111)
Creatinine, Ser: 1.05 mg/dL (ref 0.61–1.24)
GFR calc Af Amer: >60 mL/min (ref >60)
GFR calc non Af Amer: >60 mL/min (ref >60)
Glucose, Bld: 424 mg/dL — ABNORMAL HIGH (ref 70–99)
Potassium: 3 mmol/L — ABNORMAL LOW (ref 3.5–5.1)
Sodium: 143 mmol/L (ref 135–145)
Total Bilirubin: 1.9 mg/dL — ABNORMAL HIGH (ref 0.3–1.2)
Total Protein: 5.7 g/dL — ABNORMAL LOW (ref 6.5–8.1)

## 2019-05-14 LAB — GLUCOSE, CAPILLARY
Glucose-Capillary: 165 mg/dL — ABNORMAL HIGH (ref 70–99)
Glucose-Capillary: 177 mg/dL — ABNORMAL HIGH (ref 70–99)
Glucose-Capillary: 118 mg/dL — ABNORMAL HIGH (ref 70–99)
Glucose-Capillary: 119 mg/dL — ABNORMAL HIGH (ref 70–99)

## 2019-05-14 LAB — PHOSPHORUS: Phosphorus: 1.9 mg/dL — ABNORMAL LOW (ref 2.5–4.6)

## 2019-05-14 LAB — MAGNESIUM: Magnesium: 2.1 mg/dL (ref 1.7–2.4)

## 2019-05-14 MED ORDER — POTASSIUM CHLORIDE CRYS ER 20 MEQ PO TBCR
40.0000 meq | EXTENDED_RELEASE_TABLET | Freq: Once | ORAL | Status: AC
  Administered 2019-05-14: 40 meq via ORAL
  Filled 2019-05-14: qty 2

## 2019-05-14 MED ORDER — CLOPIDOGREL BISULFATE 75 MG PO TABS
75.0000 mg | ORAL_TABLET | Freq: Every day | ORAL | Status: DC
  Administered 2019-05-14 – 2019-05-17 (×4): 75 mg via ORAL
  Filled 2019-05-14 (×4): qty 1

## 2019-05-14 MED ORDER — TAMSULOSIN HCL 0.4 MG PO CAPS
0.4000 mg | ORAL_CAPSULE | Freq: Every day | ORAL | Status: DC
  Administered 2019-05-14 – 2019-05-17 (×4): 0.4 mg via ORAL
  Filled 2019-05-14 (×4): qty 1

## 2019-05-14 MED ORDER — INSULIN DETEMIR 100 UNIT/ML ~~LOC~~ SOLN
38.0000 [IU] | Freq: Every day | SUBCUTANEOUS | Status: DC
  Administered 2019-05-15 – 2019-05-16 (×2): 38 [IU] via SUBCUTANEOUS
  Filled 2019-05-14 (×3): qty 0.38

## 2019-05-14 NOTE — Progress Notes (Signed)
[facility]/Trauma Progress Note      Assessment/Plan CAD GERD Hx of MI HLD HTN PVD Hx of stroke, left sided weakness  Sepsis  Bacteremia - Klebsiella pneumoniae Gallstone pancreatitis with probable acute cholecystitis -S/Ppercchole drain placement by IR, 06/02 - drain bilious, cultures show Enterococcus faecalis - LFT's WNL and tbili trending down  - lipase 17 on 06/01 -we will follow  FEN:FLD, adv to carb mod as tolerated VTE: SCD's, lovenox JF:[id number] 05/29-06/01 Flagyl 05/30-06/04Ancef 06/01-06/04  Unasyn 06/04>> WBC WNL, afebrile  Foley:yes per medicine Follow up:TBD  Dispo: advance diet as tolerated. Will plan for lap chole as an outpt.     LOS: 6 days    Subjective: CC: no complaints  On CPAP and wants to eat breakfast. No abdominal pain. No nausea or vomiting overnight. One episode of emesis noted in chart yesterday but non overnight.   Objective: Vital signs in last 24 hours: Temp:  [97.9 F (36.6 C)-98.2 F (36.8 C)] 98.1 F (36.7 C) (06/05 0420) Pulse Rate:  [84-91] 84 (06/05 0420) Resp:  [14-20] 19 (06/05 0420) BP: (153-174)/(75-84) 153/83 (06/05 0420) SpO2:  [92 %-99 %] 92 % (06/05 0420) Last BM Date: 05/12/19  Intake/Output from previous day: 06/04 0701 - 06/05 0700 In: 1320.8 [P.O.:240; I.V.:305.8; IV Piggyback:400] Out: 1961 [Urine:1900; Emesis/NG output:1; Drains:60] Intake/Output this shift: No intake/output data recorded.  PE: Gen:NAD, alert, cooperative  HEENT: PERRL, CPAP in place Pulm:rate and effort normal Abd: Soft,obese, mild distention,mild TTP of RUQ. No peritonitis Skin: warm and dry Neuro: followingcommands   Anti-infectives: Anti-infectives (From admission, onward)   Start     Dose/Rate Route Frequency Ordered Stop   05/13/19 1200  Ampicillin-Sulbactam (UNASYN) 3 g in sodium chloride 0.9 % 100 mL IVPB     3 g 200 mL/hr over 30 Minutes Intravenous Every 8 hours 05/13/19 1134      05/10/19 1600  vancomycin (VANCOCIN) 1,250 mg in sodium chloride 0.9 % 250 mL IVPB  Status:  Discontinued     1,250 mg 166.7 mL/hr over 90 Minutes Intravenous Every 24 hours 05/09/19 1453 05/09/19 1801   05/10/19 1400  ceFAZolin (ANCEF) IVPB 1 g/50 mL premix  Status:  Discontinued     1 g 100 mL/hr over 30 Minutes Intravenous Every 8 hours 05/10/19 0910 05/10/19 1017   05/10/19 1400  ceFAZolin (ANCEF) IVPB 2g/100 mL premix  Status:  Discontinued     2 g 200 mL/hr over 30 Minutes Intravenous Every 8 hours 05/10/19 1017 05/13/19 1134   05/08/19 1730  metroNIDAZOLE (FLAGYL) IVPB 500 mg  Status:  Discontinued     500 mg 100 mL/hr over 60 Minutes Intravenous Every 8 hours 05/08/19 1657 05/12/19 1057   05/08/19 1000  vancomycin (VANCOCIN) 1,750 mg in sodium chloride 0.9 % 500 mL IVPB  Status:  Discontinued     1,750 mg 250 mL/hr over 120 Minutes Intravenous Every 12 hours 05/07/19 2153 05/09/19 1453   05/08/19 0600  ceFEPIme (MAXIPIME) 2 g in sodium chloride 0.9 % 100 mL IVPB  Status:  Discontinued     2 g 200 mL/hr over 30 Minutes Intravenous Every 8 hours 05/07/19 2153 05/10/19 0910   05/07/19 2130  ceFEPIme (MAXIPIME) 2 g in sodium chloride 0.9 % 100 mL IVPB     2 g 200 mL/hr over 30 Minutes Intravenous  Once 05/07/19 2115 05/07/19 2232   05/07/19 2130  vancomycin (VANCOCIN) IVPB 1000 mg/200 mL premix  Status:  Discontinued     1,000 mg 200  mL/hr over 60 Minutes Intravenous  Once 05/07/19 2115 05/07/19 2120   05/07/19 2130  vancomycin (VANCOCIN) 1,500 mg in sodium chloride 0.9 % 500 mL IVPB     1,500 mg 250 mL/hr over 120 Minutes Intravenous  Once 05/07/19 2120 05/08/19 0040      Lab Results:  Recent Labs    05/12/19 0308 05/13/19 0425  WBC 8.2 9.2  HGB 11.9* 11.6*  HCT 39.1 37.8*  PLT 151 147*   BMET Recent Labs    05/13/19 0425 05/14/19 0445  NA 149* 143  K 3.0* 3.0*  CL 113* 108  CO2 26 28  GLUCOSE 403* 424*  BUN 21* 15  CREATININE 1.07 1.05  CALCIUM 7.8* 7.5*    PT/INR No results for input(s): LABPROT, INR in the last 72 hours. CMP     Component Value Date/Time   NA 143 05/14/2019 0445   NA 144 09/07/2018 0949   K 3.0 (L) 05/14/2019 0445   CL 108 05/14/2019 0445   CO2 28 05/14/2019 0445   GLUCOSE 424 (H) 05/14/2019 0445   BUN 15 05/14/2019 0445   BUN 22 09/07/2018 0949   CREATININE 1.05 05/14/2019 0445   CREATININE 0.72 04/18/2015 1140   CALCIUM 7.5 (L) 05/14/2019 0445   PROT 5.7 (L) 05/14/2019 0445   PROT 7.3 08/27/2017 1039   ALBUMIN 2.3 (L) 05/14/2019 0445   ALBUMIN 4.5 08/27/2017 1039   AST 33 05/14/2019 0445   ALT 52 (H) 05/14/2019 0445   ALKPHOS 77 05/14/2019 0445   BILITOT 1.9 (H) 05/14/2019 0445   BILITOT 0.2 08/27/2017 1039   GFRNONAA >60 05/14/2019 0445   GFRAA >60 05/14/2019 0445   Lipase     Component Value Date/Time   LIPASE 17 05/10/2019 0441    Studies/Results: Dg Chest Port 1 View  Result Date: 05/13/2019 CLINICAL DATA:  Abnormal respirations EXAM: PORTABLE CHEST 1 VIEW COMPARISON:  05/12/2019 FINDINGS: Cardiac shadow is stable but accentuated by the portable technique. Right-sided PICC line is deep within the right atrium. The overall inspiratory effort is again poor. Mild central vascular congestion is again noted. No focal infiltrate is seen. IMPRESSION: Mild CHF. Electronically Signed   By: Alcide Clever M.D.   On: 05/13/2019 07:05      Jerre Simon , [facility] 05/14/2019, 9:13 AM  Pager: [phone] Mon-Wed, Friday 7:00am-4:30pm Thurs 7am-11:30am  Consults: [phone]

## 2019-05-14 NOTE — Progress Notes (Signed)
IR rounding note via telephone per new regulations. Spoke with Kathlene November, RN.  Patient with history of sepsis secondary to cholecystitis s/p percutaneous cholecystostomy drain placement 05/10/2001 by Dr. Miles Costain.  RN states that cholecystostomy site c/d/i with approximately 175 cc (since 0700 this AM) of thick, bilious brown/green fluid in gravity bag. States drain flushes without resistance.  Continue with current drain management- continue with Qshift flushes/monitor of output. Appreciate and agree with CCS/TRH management. IR to follow.   Waylan Boga Mikkel Charrette, PA-C 05/14/2019, 10:26 AM

## 2019-05-14 NOTE — Progress Notes (Signed)
PROGRESS NOTE    Johnathan Keith  XJO:[id number] DOB: May 13, 1961 DOA: 05/07/2019 PCP: Shelle Iron, MD  Brief Narrative: 58 year old obese male with acid reflux, hypertension, coronary artery disease and diabetes.  He arrived in the emergency department May 07, 2019 because of fever, chills, generalized weakness and confusion associated with nausea and vomiting.  Initially fever was 100.3 and tachycardic.  His initial lactic acid was 3.9.  His COVID-19 test was negative.  According to the bedside nurse for most of the day on May 08, 2019 he had hypoactive delirium and significant sirs physiology.  However today they feel he is somewhat improved with improved AKI and resolved lactic acidosis and improving LFT and HR and RR.  However, because of his agitated delirium  has been difficult to control and MEWS score is 6. Therefore critical care medicine has been consulted.  Evaluation is shown that GI and [facility] [place] [facility] have consulted on him and the unifying diagnosis is 1 of gallstone pancreatitis [admission diagnosis based on chemical lipase elevation of 1276] without evidence of acute cholecystitis according to consultation by the 2 clinicians.  [his CT scan did not show any pancreatitis but suggested he had cholecystitis].  He has a small stone in the gallbladder neck without any biliary dilatation.  Aggressive ICU medical management has been recommended.  Growing klebisella in blood   Assessment & Plan:   Principal Problem:   Sepsis (HCC) Active Problems:   Type 2 diabetes mellitus with vascular disease (HCC)   Hyperlipidemia   Essential hypertension   CAD -S/P PCI-2016   History of stroke   Fever   Transaminitis   Serum total bilirubin elevated   Acute pancreatitis   AKI (acute kidney injury) (HCC)   Cholecystitis    #1 acute gallstone cholecystitis and ascending cholangitis status post percutaneous cholecystostomy tube in place 05/11/2019 by interventional radiology.  PT  consult out of bed ambulate.  #2 severe sepsis due to Klebsiella bacteremia and bile culture growing Enterococcus faecalis.  Patient started on Unasyn 6 /4.  Patient needs to continue inpatient hospital monitoring due to severe sepsis need of IV antibiotics and IV fluids.  #3 status post toxic metabolic encephalopathy improved status post Precedex drip.  #4 undiagnosed obstructive sleep apnea on CPAP in the hospital needs outpatient sleep study.  #5 AKI improved with IV fluids.  #6 elevated troponin with CAD secondary to demand ischemia from sepsis.  Restart Plavix.  #7 hypernatremia improving with fluids.  Sodium 163 on admission down to 143 today.  #8 hypokalemia repleted magnesium 2.1.  #9 type 2 diabetes uncontrolled on insulin at home increase Levemir to 38 units nightly still with persistent hyperglycemia.  Also on Amaryl at home.  #10 BPH restart Flomax.  Significant Hospital Events   05/07/2019 0 admit 5/30- GI consult 5/31 - CCS consult 5/31 - ccm consult and move to ICU.  CT scan IMPRESSION: 1. Cholelithiasis with gallbladder wall thickening and pericholecystic fluid is concerning for ACUTE CHOLECYSTITIS. No significant gallbladder distension. 2. Common bile duct is normal. No evidence of pancreatitis.  Consults:  5/30- GI consult 5/31 - CCS consult 5/31 - ccm consult and move to ICU  Procedures:  05/11/2019 placement of percutaneous cholecystostomy drain per interventional radiology  Significant Diagnostic Tests:  May 07, 2019 right upper quadrant ultrasound IMPRESSION: 1. 9 mm stone versus tumefactive sludge within the gallbladder neck. No sonographic features to suggest acute cholecystitis. 2. No biliary dilatation. 3. Increased echogenicity within the hepatic parenchyma with question of  subtle nodularity of the hepatic contour, which could reflect sequelae of cirrhosis and/or steatosis.  May 09, 2019 CT scan abdomen - IMPRESSION: 1. Cholelithiasis with  gallbladder wall thickening and pericholecystic fluid is concerning for ACUTE CHOLECYSTITIS. No significant gallbladder distension. 2. Common bile duct is normal. No evidence of pancreatitis.  Micro Data:   Blood culture May 07, 2019 -growing Klebsiella pneumoniae COVID-19 and respiratory virus panel May 07, 2019-negative 6/2 gb ENTEROCOCCUS FAECALI  Antimicrobials:  Vancomycin -May 30 Cefepime May 29>> off Flagyl -May 30>>6/3 Ancef 05/10/2011>>6/4 Unasyn 6/4>>     Estimated body mass index is 39 kg/m as calculated from the following:   Height as of this encounter:  (1.676 m).   Weight as of this encounter: 109.6 kg.    Subjective:  Patient resting in bed with CPAP on asking when can eat Objective: Vitals:   05/13/19 1944 05/13/19 2353 05/14/19 0420 05/14/19 0943  BP: (!) 174/83 (!) 170/83 (!) 153/83 (!) 145/83  Pulse: 89 85 84 88  Resp: Temp: 98 F (36.7 C) 98.2 F (36.8 C) 98.1 F (36.7 C) 98.4 F (36.9 C)  TempSrc: Oral Oral Oral Oral  SpO2: 95% 99% 92% 94%  Weight:      Height:        Intake/Output Summary (Last 24 hours) at 05/14/2019 1235 Last data filed at 05/14/2019 0815 Gross per 24 hour  Intake 1680.75 ml  Output 1301 ml  Net 379.75 ml   Filed Weights   05/11/19 0416 05/12/19 0135 05/13/19 0412  Weight: 110.8 kg 110.8 kg 109.6 kg    Examination:  General exam: Appears calm and comfortable  Respiratory system: scattered rhonchi  to auscultation. Respiratory effort normal. Cardiovascular system: S1 & S2 heard, RRR. No JVD, murmurs, rubs, gallops or clicks. No pedal edema. Gastrointestinal system: Abdomen is distended, soft and nontender. No organomegaly or masses felt. Normal bowel sounds heard.  Right cholecystostomy drain in place Central nervous system: Alert and oriented. No focal neurological deficits. Extremities: Symmetric 5 x 5 power. Skin: No rashes, lesions or ulcers Psychiatry: Judgement and insight appear  normal. Mood & affect appropriate.     Data Reviewed: I have personally reviewed following labs and imaging studies  CBC: Recent Labs  Lab 05/07/19 2052  05/09/19 0408 05/10/19 0441 05/11/19 0347 05/11/19 2058 05/12/19 0308 05/13/19 0425  WBC 14.2*   < > 8.9 7.2 8.1  --  8.2 9.2  NEUTROABS 12.9*  --   --   --   --   --  6.1 7.3  HGB 14.1   < > 13.0 12.9* 12.5* 11.9* 11.9* 11.6*  HCT 44.2   < > 40.7 41.6 40.8 35.0* 39.1 37.8*  MCV 91.5   < > 91.1 93.1 94.2  --  94.7 94.5  PLT 204   < > 155 144* 166  --  151 147*   < > = values in this interval not displayed.   Basic Metabolic Panel: Recent Labs  Lab 05/10/19 2145 05/11/19 0347 05/11/19 2058 05/12/19 0308 05/12/19 1140 05/13/19 0425 05/14/19 0445  NA 154* 158* 163* 160* 150* 149* 143  K 3.5 3.5 3.4* 3.2* 3.3* 3.0* 3.0*  CL 123* 123*  --  125* 118* 113* 108  CO2 23 25  --  GLUCOSE 235* 193*  --  215* 451* 403* 424*  BUN 35* 35*  --  30* 26* 21* 15  CREATININE 1.52* 1.65*  --  1.36* 1.15 1.07 1.05  CALCIUM 7.8* 8.2*  --  8.3* 7.9* 7.8* 7.5*  MG 2.6* 2.7*  --  2.6*  --  2.2 2.1  PHOS  --   --   --  1.6* 2.3*  --  1.9*   GFR: Estimated Creatinine Clearance: 88 mL/min (by C-G formula based on SCr of 1.05 mg/dL). Liver Function Tests: Recent Labs  Lab 05/08/19 0513 05/09/19 0408 05/10/19 0441 05/11/19 0347 05/14/19 0445  AST 532* 334* 230* 169* 33  ALT 414* 460* 358* 262* 52*  ALKPHOS 75 87 84 85 77  BILITOT 3.5* 5.1* 4.1* 3.3* 1.9*  PROT 6.2* 6.6 6.6 6.2* 5.7*  ALBUMIN 3.0* 2.8* 2.6* 2.4* 2.3*   Recent Labs  Lab 05/09/19 0408 05/10/19 0441  LIPASE 20 17   Recent Labs  Lab 05/08/19 1125  AMMONIA 18   Coagulation Profile: Recent Labs  Lab 05/10/19 0441  INR 1.3*   Cardiac Enzymes: Recent Labs  Lab 05/09/19 1830 05/10/19 0441 05/10/19 2145 05/11/19 0347 05/11/19 0947  TROPONINI 0.11* 0.15* 0.07* 0.08* 0.07*   BNP (last 3 results) No results for input(s): PROBNP in the last  8760 hours. HbA1C: No results for input(s): HGBA1C in the last 72 hours. CBG: Recent Labs  Lab 05/13/19 0755 05/13/19 1134 05/13/19 1609 05/13/19 2146 05/14/19 0729  GLUCAP 180* 272* 164* 148* 165*   Lipid Profile: No results for input(s): CHOL, HDL, LDLCALC, TRIG, CHOLHDL, LDLDIRECT in the last 72 hours. Thyroid Function Tests: No results for input(s): TSH, T4TOTAL, FREET4, T3FREE, THYROIDAB in the last 72 hours. Anemia Panel: No results for input(s): VITAMINB12, FOLATE, FERRITIN, TIBC, IRON, RETICCTPCT in the last 72 hours. Sepsis Labs: Recent Labs  Lab 05/08/19 1125 05/08/19 1435 05/09/19 0408 05/09/19 0415 05/10/19 0441 05/11/19 0347  PROCALCITON 22.96  --  7.98  --  6.28 4.00  LATICACIDVEN 4.2* 3.1*  --  1.9 1.6  --     Recent Results (from the past 240 hour(s))  Blood Culture (routine x 2)     Status: Abnormal   Collection Time: 05/07/19  9:11 PM  Result Value Ref Range Status   Specimen Description BLOOD LEFT HAND  Final   Special Requests   Final    BOTTLES DRAWN AEROBIC AND ANAEROBIC Blood Culture adequate volume   Culture  Setup Time   Final    GRAM NEGATIVE RODS IN BOTH AEROBIC AND ANAEROBIC BOTTLES CRITICAL RESULT CALLED TO, READ BACK BY AND VERIFIED WITH: PHARMD ADREW Judie Petit [phone] FCP  Performed at [facility], [address], [place], [place] [address]    Culture KLEBSIELLA PNEUMONIAE (A)  Final   Report Status 05/10/2019 FINAL  Final   Organism ID, Bacteria KLEBSIELLA PNEUMONIAE  Final      Susceptibility   Klebsiella pneumoniae - MIC*    AMPICILLIN RESISTANT Resistant     CEFAZOLIN <=4 SENSITIVE Sensitive     CEFEPIME <=1 SENSITIVE Sensitive     CEFTAZIDIME <=1 SENSITIVE Sensitive     CEFTRIAXONE <=1 SENSITIVE Sensitive     CIPROFLOXACIN <=0.25 SENSITIVE Sensitive     GENTAMICIN <=1 SENSITIVE Sensitive     IMIPENEM <=0.25 SENSITIVE Sensitive     TRIMETH/SULFA <=20 SENSITIVE Sensitive     AMPICILLIN/SULBACTAM 4 SENSITIVE Sensitive      PIP/TAZO <=4 SENSITIVE Sensitive     Extended ESBL NEGATIVE Sensitive     * KLEBSIELLA PNEUMONIAE  Blood Culture ID Panel (Reflexed)     Status: Abnormal   Collection Time:  05/07/19  9:11 PM  Result Value Ref Range Status   Enterococcus species NOT DETECTED NOT DETECTED Final   Listeria monocytogenes NOT DETECTED NOT DETECTED Final   Staphylococcus species NOT DETECTED NOT DETECTED Final   Staphylococcus aureus (BCID) NOT DETECTED NOT DETECTED Final   Streptococcus species NOT DETECTED NOT DETECTED Final   Streptococcus agalactiae NOT DETECTED NOT DETECTED Final   Streptococcus pneumoniae NOT DETECTED NOT DETECTED Final   Streptococcus pyogenes NOT DETECTED NOT DETECTED Final   Acinetobacter baumannii NOT DETECTED NOT DETECTED Final   Enterobacteriaceae species DETECTED (A) NOT DETECTED Final    Comment: Enterobacteriaceae represent a large family of gram-negative bacteria, not a single organism. CRITICAL RESULT CALLED TO, READ BACK BY AND VERIFIED WITH: PHARMD ADREW M. [phone] FCP     Enterobacter cloacae complex NOT DETECTED NOT DETECTED Final   Escherichia coli NOT DETECTED NOT DETECTED Final   Klebsiella oxytoca NOT DETECTED NOT DETECTED Final   Klebsiella pneumoniae DETECTED (A) NOT DETECTED Final    Comment: CRITICAL RESULT CALLED TO, READ BACK BY AND VERIFIED WITH: PHARMD ADREW M. [phone] FCP     Proteus species NOT DETECTED NOT DETECTED Final   Serratia marcescens NOT DETECTED NOT DETECTED Final   Carbapenem resistance NOT DETECTED NOT DETECTED Final   Haemophilus influenzae NOT DETECTED NOT DETECTED Final   Neisseria meningitidis NOT DETECTED NOT DETECTED Final   Pseudomonas aeruginosa NOT DETECTED NOT DETECTED Final   Candida albicans NOT DETECTED NOT DETECTED Final   Candida glabrata NOT DETECTED NOT DETECTED Final   Candida krusei NOT DETECTED NOT DETECTED Final   Candida parapsilosis NOT DETECTED NOT DETECTED Final   Candida tropicalis NOT DETECTED NOT  DETECTED Final    Comment: Performed at [facility], [address] [place], [place] [address]  SARS Coronavirus 2 (CEPHEID- Performed in [facility] hospital lab), Hosp Order     Status: None   Collection Time: 05/07/19  9:26 PM  Result Value Ref Range Status   SARS Coronavirus 2 NEGATIVE NEGATIVE Final    Comment: (NOTE) If result is NEGATIVE SARS-CoV-2 target nucleic acids are NOT DETECTED. The SARS-CoV-2 RNA is generally detectable in upper and lower  respiratory specimens during the acute phase of infection. The lowest  concentration of SARS-CoV-2 viral copies this assay can detect is 250  copies / mL. A negative result does not preclude SARS-CoV-2 infection  and should not be used as the sole basis for treatment or other  patient management decisions.  A negative result may occur with  improper specimen collection / handling, submission of specimen other  than nasopharyngeal swab, presence of viral mutation(s) within the  areas targeted by this assay, and inadequate number of viral copies  (<250 copies / mL). A negative result must be combined with clinical  observations, patient history, and epidemiological information. If result is POSITIVE SARS-CoV-2 target nucleic acids are DETECTED. The SARS-CoV-2 RNA is generally detectable in upper and lower  respiratory specimens dur ing the acute phase of infection.  Positive  results are indicative of active infection with SARS-CoV-2.  Clinical  correlation with patient history and other diagnostic information is  necessary to determine patient infection status.  Positive results do  not rule out bacterial infection or co-infection with other viruses. If result is PRESUMPTIVE POSTIVE SARS-CoV-2 nucleic acids MAY BE PRESENT.   A presumptive positive result was obtained on the submitted specimen  and confirmed on repeat testing.  While 2019 novel coronavirus  (  SARS-CoV-2) nucleic acids may be present in the submitted sample   additional confirmatory testing may be necessary for epidemiological  and / or clinical management purposes  to differentiate between  SARS-CoV-2 and other Sarbecovirus currently known to infect humans.  If clinically indicated additional testing with an alternate test  methodology [phone]([id number]) is advised. The SARS-CoV-2 RNA is generally  detectable in upper and lower respiratory sp ecimens during the acute  phase of infection. The expected result is Negative. Fact Sheet for Patients:  BoilerBrush.com.cyhttps://www.fda.gov/media/136312/download Fact Sheet for Healthcare Providers: https://pope.com/https://www.fda.gov/media/136313/download This test is not yet approved or cleared by the [place] FDA and has been authorized for detection and/or diagnosis of SARS-CoV-2 by FDA under an Emergency Use Authorization (EUA).  This EUA will remain in effect (meaning this test can be used) for the duration of the COVID-19 declaration under Section 564(b)(1) of the Act, 21 U.S.C. section 360bbb-3(b)(1), unless the authorization is terminated or revoked sooner. Performed at [facility], [address] [place], [place] [address]   Blood Culture (routine x 2)     Status: Abnormal   Collection Time: 05/07/19  9:44 PM  Result Value Ref Range Status   Specimen Description BLOOD BLOOD LEFT FOREARM  Final   Special Requests   Final    BOTTLES DRAWN AEROBIC AND ANAEROBIC Blood Culture adequate volume   Culture  Setup Time   Final    GRAM NEGATIVE RODS IN BOTH AEROBIC AND ANAEROBIC BOTTLES CRITICAL RESULT CALLED TO, READ BACK BY AND VERIFIED WITH: T GREEN,PHARMD AT 1738 05/08/2019 BY L BENFIELD    Culture (A)  Final    KLEBSIELLA PNEUMONIAE SUSCEPTIBILITIES PERFORMED ON PREVIOUS CULTURE WITHIN THE LAST 5 DAYS. Performed at [facility], [address] [place], [place] [address]    Report Status 05/10/2019 FINAL  Final  Urine culture     Status: None   Collection Time: 05/07/19 11:10 PM  Result Value Ref Range  Status   Specimen Description URINE, RANDOM  Final   Special Requests NONE  Final   Culture   Final    NO GROWTH Performed at [facility], [address] [place], [place] [address]    Report Status 05/09/2019 FINAL  Final  Respiratory Panel by PCR     Status: None   Collection Time: 05/08/19  4:57 PM  Result Value Ref Range Status   Adenovirus NOT DETECTED NOT DETECTED Final   Coronavirus 229E NOT DETECTED NOT DETECTED Final    Comment: (NOTE) The Coronavirus on the Respiratory Panel, DOES NOT test for the novel  Coronavirus (2019 nCoV)    Coronavirus HKU1 NOT DETECTED NOT DETECTED Final   Coronavirus NL63 NOT DETECTED NOT DETECTED Final   Coronavirus OC43 NOT DETECTED NOT DETECTED Final   Metapneumovirus NOT DETECTED NOT DETECTED Final   Rhinovirus / Enterovirus NOT DETECTED NOT DETECTED Final   Influenza A NOT DETECTED NOT DETECTED Final   Influenza B NOT DETECTED NOT DETECTED Final   Parainfluenza Virus 1 NOT DETECTED NOT DETECTED Final   Parainfluenza Virus 2 NOT DETECTED NOT DETECTED Final   Parainfluenza Virus 3 NOT DETECTED NOT DETECTED Final   Parainfluenza Virus 4 NOT DETECTED NOT DETECTED Final   Respiratory Syncytial Virus NOT DETECTED NOT DETECTED Final   Bordetella pertussis NOT DETECTED NOT DETECTED Final   Chlamydophila pneumoniae NOT DETECTED NOT DETECTED Final   Mycoplasma pneumoniae NOT DETECTED NOT DETECTED Final    Comment: Performed at [facility],  [address] [place], [place] [address]  MRSA PCR Screening     Status: None   Collection Time: 05/09/19  5:37 PM  Result Value Ref Range Status   MRSA by PCR NEGATIVE NEGATIVE Final    Comment:        The GeneXpert MRSA Assay (FDA approved for NASAL specimens only), is one component of a comprehensive MRSA colonization surveillance program. It is not intended to diagnose MRSA infection nor to guide or monitor treatment for MRSA infections. Performed at [facility], [address] [place], [place] [address]   Aerobic/Anaerobic Culture (surgical/deep wound)     Status: None (Preliminary result)   Collection Time: 05/11/19  3:18 PM  Result Value Ref Range Status   Specimen Description ABSCESS GALL BLADDER  Final   Special Requests NONE  Final   Gram Stain   Final    RARE WBC PRESENT, PREDOMINANTLY MONONUCLEAR MODERATE GRAM POSITIVE COCCI Performed at [facility], [address], [place], [place] [address]    Culture   Final    ABUNDANT ENTEROCOCCUS FAECALIS NO ANAEROBES ISOLATED; CULTURE IN PROGRESS FOR 5 DAYS    Report Status PENDING  Incomplete   Organism ID, Bacteria ENTEROCOCCUS FAECALIS  Final      Susceptibility   Enterococcus faecalis - MIC*    AMPICILLIN <=2 SENSITIVE Sensitive     VANCOMYCIN 2 SENSITIVE Sensitive     GENTAMICIN SYNERGY SENSITIVE Sensitive     * ABUNDANT ENTEROCOCCUS FAECALIS         Radiology Studies: Dg Chest Port 1 View  Result Date: 05/13/2019 CLINICAL DATA:  Abnormal respirations EXAM: PORTABLE CHEST 1 VIEW COMPARISON:  05/12/2019 FINDINGS: Cardiac shadow is stable but accentuated by the portable technique. Right-sided PICC line is deep within the right atrium. The overall inspiratory effort is again poor. Mild central vascular congestion is again noted. No focal infiltrate is seen. IMPRESSION: Mild CHF. Electronically Signed   By: Alcide Clever M.D.   On: 05/13/2019 07:05        Scheduled Meds: . aspirin  150 mg Rectal Daily  . chlorhexidine  15 mL Mouth Rinse BID  . Chlorhexidine Gluconate Cloth  6 each Topical Daily  . enoxaparin (LOVENOX) injection  40 mg Subcutaneous Q24H  . insulin aspart  0-15 Units Subcutaneous TID WC  . insulin aspart  0-5 Units Subcutaneous QHS  . insulin detemir  30 Units Subcutaneous Daily  . mouth rinse  15 mL Mouth Rinse q12n4p  . sodium chloride flush  10-40 mL Intracatheter Q12H  . sodium chloride flush  5 mL Intracatheter Q8H   Continuous Infusions: . ampicillin-sulbactam  (UNASYN) IV 3 g (05/14/19 0423)  . dextrose 10 mL/hr at 05/14/19 0815     LOS: 6 days        Alwyn Ren, MD [facility]  If 7PM-7AM, please contact night-coverage [email or web address] Password [facility] 05/14/2019, 12:35 PM

## 2019-05-14 NOTE — Care Management Important Message (Signed)
Important Message  Patient Details  Name: Johnathan Keith MRN: [id number] Date of Birth: [birth year]   Medicare Important Message Given:  Yes    Renie Ora 05/14/2019, 2:24 PM

## 2019-05-15 LAB — BASIC METABOLIC PANEL
Anion gap: 10 (ref 5–15)
BUN: 12 mg/dL (ref 6–20)
CO2: 30 mmol/L (ref 22–32)
Calcium: 7.9 mg/dL — ABNORMAL LOW (ref 8.9–10.3)
Chloride: 112 mmol/L — ABNORMAL HIGH (ref 98–111)
Creatinine, Ser: 0.97 mg/dL (ref 0.61–1.24)
GFR calc Af Amer: >60 mL/min (ref >60)
GFR calc non Af Amer: >60 mL/min (ref >60)
Glucose, Bld: 122 mg/dL — ABNORMAL HIGH (ref 70–99)
Potassium: 3.1 mmol/L — ABNORMAL LOW (ref 3.5–5.1)
Sodium: 152 mmol/L — ABNORMAL HIGH (ref 135–145)

## 2019-05-15 LAB — GLUCOSE, CAPILLARY
Glucose-Capillary: 105 mg/dL — ABNORMAL HIGH (ref 70–99)
Glucose-Capillary: 184 mg/dL — ABNORMAL HIGH (ref 70–99)
Glucose-Capillary: 119 mg/dL — ABNORMAL HIGH (ref 70–99)
Glucose-Capillary: 104 mg/dL — ABNORMAL HIGH (ref 70–99)

## 2019-05-15 MED ORDER — POTASSIUM CHLORIDE CRYS ER 20 MEQ PO TBCR
40.0000 meq | EXTENDED_RELEASE_TABLET | Freq: Once | ORAL | Status: AC
  Administered 2019-05-15: 40 meq via ORAL
  Filled 2019-05-15: qty 2

## 2019-05-15 MED ORDER — SODIUM CHLORIDE 0.45 % IV SOLN
INTRAVENOUS | Status: DC
  Administered 2019-05-15 – 2019-05-17 (×5): via INTRAVENOUS

## 2019-05-15 NOTE — Evaluation (Addendum)
Occupational Therapy Evaluation Patient Details Name: Johnathan RoyalsGrady Keith MRN: [id number] DOB: Jan 04, 1961 Today's Date: 05/15/2019    History of Present Illness 58 year old obese male with acid reflux, hypertension, coronary artery disease and diabetes.  He arrived in the emergency department May 07, 2019 because of fever, chills, generalized weakness and confusion associated with nausea and vomiting. Acute gallstone cholecystitis and ascending cholangitis status post percutaneous cholecystostomy tube in place 05/11/2019 by interventional radiology. Pt also found to have severe sepsis.   Clinical Impression   Pt admitted with the above diagnoses and presents with below problem list. Pt will benefit from continued acute OT to address the below listed deficits and maximize independence with basic ADLs prior to d/c home. Pt is from home where he lives with his spouse and adult child. PTA family has been assisting pt with UB/LB ADLs and showering, walks using a rw: essentially home bound for about the past year. Pt's spouse reports pt currently sounds impaired cognitively compared to his baseline. Pt ambulated bathroom distance this session. 2x LOB due to BLE which he reports he has a h/o of happening at home as well. Pt reported feeling lightheaded once returned to supine he reported this began while OOB. Vitals assessed: bp 82/30, HR 90, O2 87, reassessed after about a minute: bp 105/55, HR 91, O2 92. Nursing notified.      Follow Up Recommendations  Home health OT;Supervision/Assistance - 24 hour    Equipment Recommendations  3 in 1 bedside commode    Recommendations for Other Services       Precautions / Restrictions Precautions Precautions: Fall Precaution Comments: h/o BLE buckling during ambualtion, no h/o falls per pt and spouse Restrictions Weight Bearing Restrictions: No Other Position/Activity Restrictions: s/p perc chole drain       Mobility Bed Mobility Overal bed mobility: Needs  Assistance Bed Mobility: Sit to Supine       Sit to supine: Mod assist   General bed mobility comments: assist to advance BLEs  Transfers Overall transfer level: Needs assistance Equipment used: Rolling walker (2 wheeled) Transfers: Sit to/from Stand Sit to Stand: Min assist;Mod assist         General transfer comment: min A on initial stand, mod A on second stand after ambulating in the room. Assist to power up and steady.    Balance Overall balance assessment: Needs assistance Sitting-balance support: Bilateral upper extremity supported;Feet supported;Single extremity supported Sitting balance-Leahy Scale: Fair     Standing balance support: Bilateral upper extremity supported;During functional activity Standing balance-Leahy Scale: Poor Standing balance comment: external support for balance                           ADL either performed or assessed with clinical judgement   ADL Overall ADL's : Needs assistance/impaired Eating/Feeding: Set up;Sitting   Grooming: Moderate assistance;Sitting   Upper Body Bathing: Maximal assistance;Sitting   Lower Body Bathing: Maximal assistance;Sit to/from stand   Upper Body Dressing : Maximal assistance;Sitting   Lower Body Dressing: Maximal assistance;Sit to/from stand   Toilet Transfer: Moderate assistance;Ambulation Toilet Transfer Details (indicate cue type and reason): Mostly min A to walk distance to/from bathroom. Mod A 2x due to BLE buckling Toileting- Clothing Manipulation and Hygiene: Maximal assistance;Sit to/from stand   Tub/ Shower Transfer: Tub transfer;Maximal assistance;Ambulation;Moderate assistance   Functional mobility during ADLs: Moderate assistance;Rolling walker General ADL Comments: Pt completed functional mobility at bathroom distance. Mod A 2x due to BLE buckling. Pt with  some impaired cognition also impacting ADLs.      Vision         Perception     Praxis      Pertinent  Vitals/Pain Pain Assessment: Faces Faces Pain Scale: Hurts a little bit Pain Location: mild grimacing with mobility Pain Intervention(s): Monitored during session;Limited activity within patient's tolerance     Hand Dominance     Extremity/Trunk Assessment Upper Extremity Assessment Upper Extremity Assessment: Generalized weakness   Lower Extremity Assessment Lower Extremity Assessment: Defer to PT evaluation       Communication Communication Communication: No difficulties   Cognition Arousal/Alertness: Awake/alert Behavior During Therapy: Flat affect Overall Cognitive Status: Impaired/Different from baseline Area of Impairment: Safety/judgement;Attention;Problem solving;Awareness                   Current Attention Level: Selective     Safety/Judgement: Decreased awareness of deficits Awareness: Intellectual Problem Solving: Slow processing;Difficulty sequencing General Comments: PT spoke with pt's spouse on the phone. She reports in talking with pt over the phone he has not sounded like himself, "slower."   General Comments       Exercises     Shoulder Instructions      Home Living Family/patient expects to be discharged to:: Private residence Living Arrangements: Spouse/significant other;Children Available Help at Discharge: Family Type of Home: House Home Access: Level entry     Home Layout: One level     Bathroom Shower/Tub: Tub/shower unit         Home Equipment: Environmental consultant - 2 wheels;Tub bench;Cane - single point;Grab bars - tub/shower          Prior Functioning/Environment Level of Independence: Needs assistance  Gait / Transfers Assistance Needed: rw, has been home bound for about a year; family assists with shower transfers ADL's / Homemaking Assistance Needed: family assists with UB/LB ADLs.   Comments: per pt and PT conversation with spouse over the phone        OT Problem List: Decreased strength;Decreased activity  tolerance;Impaired balance (sitting and/or standing);Decreased cognition;Decreased safety awareness;Decreased knowledge of use of DME or AE;Decreased knowledge of precautions;Cardiopulmonary status limiting activity;Obesity;Pain      OT Treatment/Interventions: Self-care/ADL training;Therapeutic exercise;Energy conservation;DME and/or AE instruction;Therapeutic activities;Cognitive remediation/compensation;Patient/family education;Balance training    OT Goals(Current goals can be found in the care plan section) Acute Rehab OT Goals Patient Stated Goal: get stronger, go home OT Goal Formulation: With patient/family Time For Goal Achievement: 05/29/19 Potential to Achieve Goals: Good ADL Goals Pt Will Perform Grooming: with min assist;sitting Pt Will Perform Upper Body Bathing: with min assist;sitting Pt Will Perform Lower Body Bathing: with mod assist;sit to/from stand Pt Will Perform Upper Body Dressing: with min assist;sitting Pt Will Perform Lower Body Dressing: with mod assist;sit to/from stand Pt Will Transfer to Toilet: with min guard assist;ambulating Pt Will Perform Toileting - Clothing Manipulation and hygiene: with min assist;sit to/from stand Pt Will Perform Tub/Shower Transfer: Tub transfer;with mod assist;ambulating;tub bench;rolling walker  OT Frequency: Min 3X/week   Barriers to D/C:            Co-evaluation PT/OT/SLP Co-Evaluation/Treatment: Yes Reason for Co-Treatment: For patient/therapist safety;To address functional/ADL transfers;Complexity of the patient's impairments (multi-system involvement);Necessary to address cognition/behavior during functional activity   OT goals addressed during session: ADL's and self-care;Proper use of Adaptive equipment and DME;Strengthening/ROM      AM-PAC OT "6 Clicks" Daily Activity     Outcome Measure Help from another person eating meals?: None Help from another person taking care  of personal grooming?: A Lot Help from another  person toileting, which includes using toliet, bedpan, or urinal?: A Lot Help from another person bathing (including washing, rinsing, drying)?: A Lot Help from another person to put on and taking off regular upper body clothing?: A Lot Help from another person to put on and taking off regular lower body clothing?: A Lot 6 Click Score: 14   End of Session Equipment Utilized During Treatment: Rolling walker;Gait belt;Oxygen Nurse Communication: Mobility status;Other (comment)(lightheaded at end of session, suspect orthostatic OOB)  Activity Tolerance: Patient limited by fatigue;Other (comment)(lightheaded at end of session) Patient left: in bed;with call bell/phone within reach  OT Visit Diagnosis: Unsteadiness on feet (R26.81);Pain;Other symptoms and signs involving cognitive function;Muscle weakness (generalized) (M62.81)                Time: 3838-1840 OT Time Calculation (min): 27 min Charges:  OT General Charges $OT Visit: 1 Visit OT Evaluation $OT Eval Moderate Complexity: [place], OT Acute Rehabilitation Services Pager: [phone] Office: [phone]   Hortencia Pilar 05/15/2019, 1:16 PM

## 2019-05-15 NOTE — Progress Notes (Signed)
Patient states he does not want to wear CPAP at this time due to it being uncomfortable. RT educated patient and informed patient if he changes his mind have RN contact RT.

## 2019-05-15 NOTE — Progress Notes (Signed)
Subjective: Denies abdominal pain or nausea or vomiting.  Says he is tolerating diet but says they are only bringing him Jell-O. Urine output controlled with catheter Some flatus but no stool Denies nausea or vomiting Does not seem to have clear understanding of what his medical and surgical problems are. BP 189/93.  Afebrile.  Heart rate 81.  SPO2 97. No lab work today. Objective: Vital signs in last 24 hours: Temp:  [97.9 F (36.6 C)-98.7 F (37.1 C)] 97.9 F (36.6 C) (06/06 0307) Pulse Rate:  [81-92] 81 (06/06 0400) Resp:  [14-20] 16 (06/06 0400) BP: (141-189)/(69-93) 189/93 (06/06 0307) SpO2:  [86 %-100 %] 97 % (06/06 0400) Weight:  [110.8 kg] 110.8 kg (06/06 0319) Last BM Date: 05/12/19  Intake/Output from previous day: 06/05 0701 - 06/06 0700 In: 1960 [P.O.:960; IV Piggyback:400] Out: 3050 [Urine:2850; Drains:200] Intake/Output this shift: Total I/O In: 700 [Other:300; IV Piggyback:400] Out: 1500 [Urine:1300; Drains:200]    PE: Gen:NAD, alert, cooperative, insight marginal.  Reticent. HEENT: PERRL, on room air Pulm:rate and effort normal Abd: Soft,obese, soft.  RUQ drain with clear bilious drainage. Skin: warm and dry Neuro: followingcommands   Lab Results:  Recent Labs    05/13/19 0425  WBC 9.2  HGB 11.6*  HCT 37.8*  PLT 147*   BMET Recent Labs    05/13/19 0425 05/14/19 0445  NA 149* 143  K 3.0* 3.0*  CL 113* 108  CO2 26 28  GLUCOSE 403* 424*  BUN 21* 15  CREATININE 1.07 1.05  CALCIUM 7.8* 7.5*   PT/INR No results for input(s): LABPROT, INR in the last 72 hours. ABG No results for input(s): PHART, HCO3 in the last 72 hours.  Invalid input(s): PCO2, PO2  Studies/Results: No results found.  Anti-infectives: Anti-infectives (From admission, onward)   Start     Dose/Rate Route Frequency Ordered Stop   05/13/19 1200  Ampicillin-Sulbactam (UNASYN) 3 g in sodium chloride 0.9 % 100 mL IVPB     3 g 200 mL/hr over 30 Minutes  Intravenous Every 8 hours 05/13/19 1134     05/10/19 1600  vancomycin (VANCOCIN) 1,250 mg in sodium chloride 0.9 % 250 mL IVPB  Status:  Discontinued     1,250 mg 166.7 mL/hr over 90 Minutes Intravenous Every 24 hours 05/09/19 1453 05/09/19 1801   05/10/19 1400  ceFAZolin (ANCEF) IVPB 1 g/50 mL premix  Status:  Discontinued     1 g 100 mL/hr over 30 Minutes Intravenous Every 8 hours 05/10/19 0910 05/10/19 1017   05/10/19 1400  ceFAZolin (ANCEF) IVPB 2g/100 mL premix  Status:  Discontinued     2 g 200 mL/hr over 30 Minutes Intravenous Every 8 hours 05/10/19 1017 05/13/19 1134   05/08/19 1730  metroNIDAZOLE (FLAGYL) IVPB 500 mg  Status:  Discontinued     500 mg 100 mL/hr over 60 Minutes Intravenous Every 8 hours 05/08/19 1657 05/12/19 1057   05/08/19 1000  vancomycin (VANCOCIN) 1,750 mg in sodium chloride 0.9 % 500 mL IVPB  Status:  Discontinued     1,750 mg 250 mL/hr over 120 Minutes Intravenous Every 12 hours 05/07/19 2153 05/09/19 1453   05/08/19 0600  ceFEPIme (MAXIPIME) 2 g in sodium chloride 0.9 % 100 mL IVPB  Status:  Discontinued     2 g 200 mL/hr over 30 Minutes Intravenous Every 8 hours 05/07/19 2153 05/10/19 0910   05/07/19 2130  ceFEPIme (MAXIPIME) 2 g in sodium chloride 0.9 % 100 mL IVPB     2 g  200 mL/hr over 30 Minutes Intravenous  Once 05/07/19 2115 05/07/19 2232   05/07/19 2130  vancomycin (VANCOCIN) IVPB 1000 mg/200 mL premix  Status:  Discontinued     1,000 mg 200 mL/hr over 60 Minutes Intravenous  Once 05/07/19 2115 05/07/19 2120   05/07/19 2130  vancomycin (VANCOCIN) 1,500 mg in sodium chloride 0.9 % 500 mL IVPB     1,500 mg 250 mL/hr over 120 Minutes Intravenous  Once 05/07/19 2120 05/08/19 0040      Assessment/Plan:   CAD GERD Hx of MI HLD HTN PVD Hx of stroke, left sided weakness--- on Lovenox and Plavix.  Sepsis  Bacteremia - Klebsiella pneumoniae bacteremia, enterococcus from bile.  continue antibiotics. Gallstone pancreatitis with probable acute  cholecystitis -S/Ppercchole drain placement by IR, 06/02 - drain bilious,cultures show Enterococcus faecalis -LFT's WNL and tbili trending down - lipase 17 on 06/01 -we will follow  FEN:FLD, adv to carb mod as tolerated VTE: SCD's, lovenox DP:OEUMPNTI 05/29-06/01 Flagyl 05/30-06/04Ancef 06/01-06/04  Unasyn 06/04>> WBC WNL, afebrile  Foley:yes per medicine Follow RW:ERXVQMG will plan laparoscopic cholecystectomy in 6 to 8 weeks after medical clearance as outpatient.  Dispo: advance diet as tolerated. Will plan for lap chole as an outpt.             Poorly motivated and sedentary.  Needs aggressive PT.   LOS: 7 days    Ernestene Mention 05/15/2019

## 2019-05-15 NOTE — Progress Notes (Signed)
Foley removed at  1100. Patient due to void at 1700-1900. Pt had not voided yet. Bladder scan showed 329. Dr. Zigmund Daniel paged at [phone], no response.

## 2019-05-15 NOTE — Evaluation (Signed)
Physical Therapy Evaluation Patient Details Name: Johnathan Keith MRN: [id number] DOB: 02-10-1961 Today's Date: 05/15/2019   History of Present Illness  58 year old obese male with acid reflux, hypertension, coronary artery disease and diabetes.  He arrived in the emergency department May 07, 2019 because of fever, chills, generalized weakness and confusion associated with nausea and vomiting. Acute gallstone cholecystitis and ascending cholangitis status post percutaneous cholecystostomy tube in place 05/11/2019 by interventional radiology. Pt also found to have severe sepsis.    Clinical Impression  Pt admitted with above diagnosis. Pt currently with functional limitations due to the deficits listed below (see PT Problem List). PTA pt at home with wife and 2 daughters, 24/7 supervision, receiving assistance for ADLs, with some near falls at home. History obtained from wife over phone. Today, weaker than baseline, buckling during short distance gait, utilizing RW today. Discussed with wife, they'd like him home with their support and HHPT ,feel this will be appropriate.  Pt will benefit from skilled PT to increase their independence and safety with mobility to allow discharge to the venue listed below.       Follow Up Recommendations Home health PT;Supervision/Assistance - 24 hour    Equipment Recommendations  None recommended by PT    Recommendations for Other Services       Precautions / Restrictions Precautions Precautions: Fall Precaution Comments: h/o BLE buckling during ambualtion, no h/o falls per pt and spouse Restrictions Weight Bearing Restrictions: No Other Position/Activity Restrictions: s/p perc chole drain       Mobility  Bed Mobility Overal bed mobility: Needs Assistance Bed Mobility: Sit to Supine       Sit to supine: Mod assist   General bed mobility comments: assist to advance BLEs  Transfers Overall transfer level: Needs assistance Equipment used: Rolling  walker (2 wheeled) Transfers: Sit to/from Stand Sit to Stand: Min assist;Mod assist         General transfer comment: min A on initial stand, mod A on second stand after ambulating in the room. Assist to power up and steady.  Ambulation/Gait Ambulation/Gait assistance: Min assist Gait Distance (Feet): 15 Feet Assistive device: Rolling walker (2 wheeled) Gait Pattern/deviations: Step-to pattern Gait velocity: decreased   General Gait Details: deferred further gait due to buckling, near fall, use of RW and external assist to steady.  Stairs            Wheelchair Mobility    Modified Rankin (Stroke Patients Only)       Balance Overall balance assessment: Needs assistance Sitting-balance support: Bilateral upper extremity supported;Feet supported;Single extremity supported Sitting balance-Leahy Scale: Fair     Standing balance support: Bilateral upper extremity supported;During functional activity Standing balance-Leahy Scale: Poor Standing balance comment: external support for balance                             Pertinent Vitals/Pain Pain Assessment: Faces Faces Pain Scale: Hurts a little bit Pain Location: mild grimacing with mobility Pain Intervention(s): Monitored during session;Limited activity within patient's tolerance    Home Living Family/patient expects to be discharged to:: Private residence Living Arrangements: Spouse/significant other;Children Available Help at Discharge: Family Type of Home: House Home Access: Level entry     Home Layout: One level Home Equipment: Environmental consultant - 2 wheels;Tub bench;Cane - single point;Grab bars - tub/shower      Prior Function Level of Independence: Needs assistance   Gait / Transfers Assistance Needed: rw, has been home bound  for about a year; family assists with shower transfers  ADL's / Homemaking Assistance Needed: family assists with UB/LB ADLs.  Comments: per pt and PT conversation with spouse  over the phone     Hand Dominance        Extremity/Trunk Assessment   Upper Extremity Assessment Upper Extremity Assessment: Generalized weakness    Lower Extremity Assessment Lower Extremity Assessment: Generalized weakness       Communication   Communication: No difficulties  Cognition Arousal/Alertness: Awake/alert Behavior During Therapy: Flat affect Overall Cognitive Status: Impaired/Different from baseline Area of Impairment: Safety/judgement;Attention;Problem solving;Awareness                   Current Attention Level: Selective     Safety/Judgement: Decreased awareness of deficits Awareness: Intellectual Problem Solving: Slow processing;Difficulty sequencing General Comments: PT spoke with pt's spouse on the phone. She reports in talking with pt over the phone he has not sounded like himself, "slower."      General Comments      Exercises     Assessment/Plan    PT Assessment Patient needs continued PT services  PT Problem List Decreased strength       PT Treatment Interventions DME instruction;Stair training;Gait training;Therapeutic activities;Functional mobility training;Therapeutic exercise    PT Goals (Current goals can be found in the Care Plan section)  Acute Rehab PT Goals Patient Stated Goal: get stronger, go home PT Goal Formulation: With patient Time For Goal Achievement: 05/29/19 Potential to Achieve Goals: Good    Frequency Min 3X/week   Barriers to discharge        Co-evaluation PT/OT/SLP Co-Evaluation/Treatment: Yes Reason for Co-Treatment: Complexity of the patient's impairments (multi-system involvement);Necessary to address cognition/behavior during functional activity;For patient/therapist safety   OT goals addressed during session: ADL's and self-care;Proper use of Adaptive equipment and DME;Strengthening/ROM       AM-PAC PT "6 Clicks" Mobility  Outcome Measure Help needed turning from your back to your side  while in a flat bed without using bedrails?: None Help needed moving from lying on your back to sitting on the side of a flat bed without using bedrails?: A Little Help needed moving to and from a bed to a chair (including a wheelchair)?: A Little Help needed standing up from a chair using your arms (e.g., wheelchair or bedside chair)?: A Little Help needed to walk in hospital room?: A Little Help needed climbing 3-5 steps with a railing? : A Lot 6 Click Score: 18    End of Session Equipment Utilized During Treatment: Gait belt Activity Tolerance: Patient tolerated treatment well Patient left: in bed Nurse Communication: Mobility status PT Visit Diagnosis: Unsteadiness on feet (R26.81)    Time: 1200-1230 PT Time Calculation (min) (ACUTE ONLY): 30 min   Charges:   PT Evaluation $PT Eval Moderate Complexity: 1 Mod         Etta Grandchild, PT, DPT Acute Rehabilitation Services Pager: [phone] Office: [phone]    Etta Grandchild 05/15/2019, 3:49 PM

## 2019-05-15 NOTE — Progress Notes (Signed)
PROGRESS NOTE    Johnathan Keith Fetting  KGM:[id number] DOB: 1960-12-28 DOA: 05/07/2019 PCP: Shelle IronSistasis, [name], MD  Brief Narrative: 58 year old obese male with acid reflux, hypertension, coronary artery disease and diabetes. He arrived in the emergency department May 07, 2019 because of fever, chills, generalized weakness and confusion associated with nausea and vomiting. Initially fever was 100.3 and tachycardic. His initial lactic acid was 3.9. His COVID-19 test was negative. According to the bedside nurse for most of the day on May 08, 2019 he had hypoactive delirium and significant sirs physiology. However today they feel he is somewhat improved with improved AKI and resolved lactic acidosis and improving LFT and HR and RR. However, because of his agitated delirium has been difficult to control and MEWS score is 6. Therefore critical care medicine has been consulted.  Evaluation is shown that GI and [place] [facility] have consulted on him and the unifying diagnosis is 1 of gallstone pancreatitis [admission diagnosis based on chemical lipase elevation of 1276] without evidence of acute cholecystitis according to consultation by the 2 clinicians. [his CT scan did not show any pancreatitis but suggested he had cholecystitis]. He has a small stone in the gallbladder neck without any biliary dilatation. Aggressive ICU medical management has been recommended.  Growing klebisella in blood  Assessment & Plan:   Principal Problem:   Sepsis (HCC) Active Problems:   Type 2 diabetes mellitus with vascular disease (HCC)   Hyperlipidemia   Essential hypertension   CAD -S/P PCI-2016   History of stroke   Fever   Transaminitis   Serum total bilirubin elevated   Acute pancreatitis   AKI (acute kidney injury) (HCC)   Cholecystitis  #1 acute gallstone cholecystitis and ascending cholangitis status post percutaneous cholecystostomy tube in place 05/11/2019 by interventional radiology.  PT consult  out of bed ambulate.  DC Foley catheter.  #2 severe sepsis due to Klebsiella bacteremia and bile culture growing Enterococcus faecalis.  Patient started on Unasyn 6 /4.  Patient needs to continue inpatient hospital monitoring due to severe sepsis need of IV antibiotics and IV fluids.  #3 status post toxic metabolic encephalopathy improved status post Precedex drip.  #4 undiagnosed obstructive sleep apnea on CPAP in the hospital needs outpatient sleep study.  #5 AKI improved with IV fluids.  #6 elevated troponin with CAD secondary to demand ischemia from sepsis.  Restart Plavix.  #7 hypernatremia improving with fluids.  Sodium 163 on admission down to 152 today.  #8 hypokalemia replete   #9 type 2 diabetes uncontrolled on insulin at home increase Levemir to 38 units nightly still with persistent hyperglycemia.  Also on Amaryl at home.  #10 BPH restart Flomax.   Significant Hospital Events  5/29/20200 admit 5/30- GI consult 5/31 - CCS consult 5/31 - ccm consult and move to ICU. CT scan IMPRESSION: 1. Cholelithiasis with gallbladder wall thickening and pericholecystic fluid is concerning for ACUTE CHOLECYSTITIS. No significant gallbladder distension. 2. Common bile duct is normal. No evidence of pancreatitis.  Consults:  5/30- GI consult 5/31 - CCS consult 5/31 - ccm consult and move to ICU  Procedures:  05/11/2019 placement of percutaneous cholecystostomy drain per interventional radiology  Significant Diagnostic Tests:  May 07, 2019 right upper quadrant ultrasound IMPRESSION: 1. 9 mm stone versus tumefactive sludge within the gallbladder neck. No sonographic features to suggest acute cholecystitis. 2. No biliary dilatation. 3. Increased echogenicity within the hepatic parenchyma with question of subtle nodularity of the hepatic contour, which could reflect sequelae of cirrhosis and/or  steatosis.  May 09, 2019 CT scan abdomen - IMPRESSION: 1.  Cholelithiasis with gallbladder wall thickening and pericholecystic fluid is concerning for ACUTE CHOLECYSTITIS. No significant gallbladder distension. 2. Common bile duct is normal. No evidence of pancreatitis.  Micro Data:   Blood culture May 07, 2019 -growing Klebsiella pneumoniae COVID-19 and respiratory virus panel May 07, 2019-negative 6/2 gbENTEROCOCCUS FAECALI  Antimicrobials:  Vancomycin -May 30 Cefepime May 29>>off Flagyl -May 30>>6/3 Ancef 05/10/2011>>6/4 Unasyn 6/4>>  Estimated body mass index is 39.43 kg/m as calculated from the following:   Height as of this encounter: 5\' 6"  (1.676 m).   Weight as of this encounter: 110.8 kg.   Subjective: Patient resting in bed used CPAP overnight denies any new complaints has oxygen at home 24/7 is on 2 L. Denies nausea vomiting abdominal pain   objective: Vitals:   05/15/19 0319 05/15/19 0400 05/15/19 0913 05/15/19 1149  BP:   (!) 155/88 120/70  Pulse:  81 83 86  Resp:  16 16 13   Temp:    (!) 97.5 F (36.4 C)  TempSrc:    Oral  SpO2:  97% 92% 99%  Weight: 110.8 kg     Height:        Intake/Output Summary (Last 24 hours) at 05/15/2019 1209 Last data filed at 05/15/2019 0622 Gross per 24 hour  Intake 1800 ml  Output 3050 ml  Net -1250 ml   Filed Weights   05/12/19 0135 05/13/19 0412 05/15/19 0319  Weight: 110.8 kg 109.6 kg 110.8 kg    Examination:  General exam: Appears calm and comfortable  Respiratory system: Clear to auscultation. Respiratory effort normal. Cardiovascular system: S1 & S2 heard, RRR. No JVD, murmurs, rubs, gallops or clicks. No pedal edema. Gastrointestinal system: Abdomen is distended, soft and nontender. No organomegaly or masses felt. Normal bowel sounds heard.  Drain in place right side of the abdomen draining bile colored  Central nervous system: Alert and oriented. No focal neurological deficits. Extremities: Symmetric 5 x 5 power. Skin: No rashes, lesions or ulcers Psychiatry:  Judgement and insight appear normal. Mood & affect appropriate.     Data Reviewed: I have personally reviewed following labs and imaging studies  CBC: Recent Labs  Lab 05/09/19 0408 05/10/19 0441 05/11/19 0347 05/11/19 2058 05/12/19 0308 05/13/19 0425  WBC 8.9 7.2 8.1  --  8.2 9.2  NEUTROABS  --   --   --   --  6.1 7.3  HGB 13.0 12.9* 12.5* 11.9* 11.9* 11.6*  HCT 40.7 41.6 40.8 35.0* 39.1 37.8*  MCV 91.1 93.1 94.2  --  94.7 94.5  PLT 155 144* 166  --  151 147*   Basic Metabolic Panel: Recent Labs  Lab 05/10/19 2145 05/11/19 0347  05/12/19 0308 05/12/19 1140 05/13/19 0425 05/14/19 0445 05/15/19 0559  NA 154* 158*   < > 160* 150* 149* 143 152*  K 3.5 3.5   < > 3.2* 3.3* 3.0* 3.0* 3.1*  CL 123* 123*  --  125* 118* 113* 108 112*  CO2 23 25  --  25 24 26 28 30   GLUCOSE 235* 193*  --  215* 451* 403* 424* 122*  BUN 35* 35*  --  30* 26* 21* 15 12  CREATININE 1.52* 1.65*  --  1.36* 1.15 1.07 1.05 0.97  CALCIUM 7.8* 8.2*  --  8.3* 7.9* 7.8* 7.5* 7.9*  MG 2.6* 2.7*  --  2.6*  --  2.2 2.1  --   PHOS  --   --   --  1.6* 2.3*  --  1.9*  --    < > = values in this interval not displayed.   GFR: Estimated Creatinine Clearance: 95.8 mL/min (by C-G formula based on SCr of 0.97 mg/dL). Liver Function Tests: Recent Labs  Lab 05/09/19 0408 05/10/19 0441 05/11/19 0347 05/14/19 0445  AST 334* 230* 169* 33  ALT 460* 358* 262* 52*  ALKPHOS 87 84 85 77  BILITOT 5.1* 4.1* 3.3* 1.9*  PROT 6.6 6.6 6.2* 5.7*  ALBUMIN 2.8* 2.6* 2.4* 2.3*   Recent Labs  Lab 05/09/19 0408 05/10/19 0441  LIPASE 20 17   No results for input(s): AMMONIA in the last 168 hours. Coagulation Profile: Recent Labs  Lab 05/10/19 0441  INR 1.3*   Cardiac Enzymes: Recent Labs  Lab 05/09/19 1830 05/10/19 0441 05/10/19 2145 05/11/19 0347 05/11/19 0947  TROPONINI 0.11* 0.15* 0.07* 0.08* 0.07*   BNP (last 3 results) No results for input(s): PROBNP in the last 8760 hours. HbA1C: No results for  input(s): HGBA1C in the last 72 hours. CBG: Recent Labs  Lab 05/14/19 1257 05/14/19 1629 05/14/19 2124 05/15/19 0746 05/15/19 1152  GLUCAP 177* 118* 119* 105* 184*   Lipid Profile: No results for input(s): CHOL, HDL, LDLCALC, TRIG, CHOLHDL, LDLDIRECT in the last 72 hours. Thyroid Function Tests: No results for input(s): TSH, T4TOTAL, FREET4, T3FREE, THYROIDAB in the last 72 hours. Anemia Panel: No results for input(s): VITAMINB12, FOLATE, FERRITIN, TIBC, IRON, RETICCTPCT in the last 72 hours. Sepsis Labs: Recent Labs  Lab 05/08/19 1435 05/09/19 0408 05/09/19 0415 05/10/19 0441 05/11/19 0347  PROCALCITON  --  7.98  --  6.28 4.00  LATICACIDVEN 3.1*  --  1.9 1.6  --     Recent Results (from the past 240 hour(s))  Blood Culture (routine x 2)     Status: Abnormal   Collection Time: 05/07/19  9:11 PM  Result Value Ref Range Status   Specimen Description BLOOD LEFT HAND  Final   Special Requests   Final    BOTTLES DRAWN AEROBIC AND ANAEROBIC Blood Culture adequate volume   Culture  Setup Time   Final    GRAM NEGATIVE RODS IN BOTH AEROBIC AND ANAEROBIC BOTTLES CRITICAL RESULT CALLED TO, READ BACK BY AND VERIFIED WITH: PHARMD ADREW Judie Petit [phone] FCP  Performed at [facility], [address], [place], [place] [address]    Culture KLEBSIELLA PNEUMONIAE (A)  Final   Report Status 05/10/2019 FINAL  Final   Organism ID, Bacteria KLEBSIELLA PNEUMONIAE  Final      Susceptibility   Klebsiella pneumoniae - MIC*    AMPICILLIN RESISTANT Resistant     CEFAZOLIN <=4 SENSITIVE Sensitive     CEFEPIME <=1 SENSITIVE Sensitive     CEFTAZIDIME <=1 SENSITIVE Sensitive     CEFTRIAXONE <=1 SENSITIVE Sensitive     CIPROFLOXACIN <=0.25 SENSITIVE Sensitive     GENTAMICIN <=1 SENSITIVE Sensitive     IMIPENEM <=0.25 SENSITIVE Sensitive     TRIMETH/SULFA <=20 SENSITIVE Sensitive     AMPICILLIN/SULBACTAM 4 SENSITIVE Sensitive     PIP/TAZO <=4 SENSITIVE Sensitive     Extended ESBL NEGATIVE  Sensitive     * KLEBSIELLA PNEUMONIAE  Blood Culture ID Panel (Reflexed)     Status: Abnormal   Collection Time: 05/07/19  9:11 PM  Result Value Ref Range Status   Enterococcus species NOT DETECTED NOT DETECTED Final   Listeria monocytogenes NOT DETECTED NOT DETECTED Final   Staphylococcus species NOT DETECTED NOT DETECTED Final  Staphylococcus aureus (BCID) NOT DETECTED NOT DETECTED Final   Streptococcus species NOT DETECTED NOT DETECTED Final   Streptococcus agalactiae NOT DETECTED NOT DETECTED Final   Streptococcus pneumoniae NOT DETECTED NOT DETECTED Final   Streptococcus pyogenes NOT DETECTED NOT DETECTED Final   Acinetobacter baumannii NOT DETECTED NOT DETECTED Final   Enterobacteriaceae species DETECTED (A) NOT DETECTED Final    Comment: Enterobacteriaceae represent a large family of gram-negative bacteria, not a single organism. CRITICAL RESULT CALLED TO, READ BACK BY AND VERIFIED WITH: PHARMD ADREW M. [phone] FCP     Enterobacter cloacae complex NOT DETECTED NOT DETECTED Final   Escherichia coli NOT DETECTED NOT DETECTED Final   Klebsiella oxytoca NOT DETECTED NOT DETECTED Final   Klebsiella pneumoniae DETECTED (A) NOT DETECTED Final    Comment: CRITICAL RESULT CALLED TO, READ BACK BY AND VERIFIED WITH: PHARMD ADREW M. [phone] FCP     Proteus species NOT DETECTED NOT DETECTED Final   Serratia marcescens NOT DETECTED NOT DETECTED Final   Carbapenem resistance NOT DETECTED NOT DETECTED Final   Haemophilus influenzae NOT DETECTED NOT DETECTED Final   Neisseria meningitidis NOT DETECTED NOT DETECTED Final   Pseudomonas aeruginosa NOT DETECTED NOT DETECTED Final   Candida albicans NOT DETECTED NOT DETECTED Final   Candida glabrata NOT DETECTED NOT DETECTED Final   Candida krusei NOT DETECTED NOT DETECTED Final   Candida parapsilosis NOT DETECTED NOT DETECTED Final   Candida tropicalis NOT DETECTED NOT DETECTED Final    Comment: Performed at [facility],  [address], [place], [place] [address]  SARS Coronavirus 2 (CEPHEID- Performed in [facility] hospital lab), Hosp Order     Status: None   Collection Time: 05/07/19  9:26 PM  Result Value Ref Range Status   SARS Coronavirus 2 NEGATIVE NEGATIVE Final    Comment: (NOTE) If result is NEGATIVE SARS-CoV-2 target nucleic acids are NOT DETECTED. The SARS-CoV-2 RNA is generally detectable in upper and lower  respiratory specimens during the acute phase of infection. The lowest  concentration of SARS-CoV-2 viral copies this assay can detect is 250  copies / mL. A negative result does not preclude SARS-CoV-2 infection  and should not be used as the sole basis for treatment or other  patient management decisions.  A negative result may occur with  improper specimen collection / handling, submission of specimen other  than nasopharyngeal swab, presence of viral mutation(s) within the  areas targeted by this assay, and inadequate number of viral copies  (<250 copies / mL). A negative result must be combined with clinical  observations, patient history, and epidemiological information. If result is POSITIVE SARS-CoV-2 target nucleic acids are DETECTED. The SARS-CoV-2 RNA is generally detectable in upper and lower  respiratory specimens dur ing the acute phase of infection.  Positive  results are indicative of active infection with SARS-CoV-2.  Clinical  correlation with patient history and other diagnostic information is  necessary to determine patient infection status.  Positive results do  not rule out bacterial infection or co-infection with other viruses. If result is PRESUMPTIVE POSTIVE SARS-CoV-2 nucleic acids MAY BE PRESENT.   A presumptive positive result was obtained on the submitted specimen  and confirmed on repeat testing.  While 2019 novel coronavirus  (SARS-CoV-2) nucleic acids may be present in the submitted sample  additional confirmatory testing may be necessary for epidemiological   and / or clinical management purposes  to differentiate between  SARS-CoV-2 and other Sarbecovirus currently known to  infect humans.  If clinically indicated additional testing with an alternate test  methodology [phone]([id number]) is advised. The SARS-CoV-2 RNA is generally  detectable in upper and lower respiratory sp ecimens during the acute  phase of infection. The expected result is Negative. Fact Sheet for Patients:  BoilerBrush.com.cyhttps://www.fda.gov/media/136312/download Fact Sheet for Healthcare Providers: https://pope.com/https://www.fda.gov/media/136313/download This test is not yet approved or cleared by the [place] FDA and has been authorized for detection and/or diagnosis of SARS-CoV-2 by FDA under an Emergency Use Authorization (EUA).  This EUA will remain in effect (meaning this test can be used) for the duration of the COVID-19 declaration under Section 564(b)(1) of the Act, 21 U.S.C. section 360bbb-3(b)(1), unless the authorization is terminated or revoked sooner. Performed at [facility], [address] [place], [place] [address]   Blood Culture (routine x 2)     Status: Abnormal   Collection Time: 05/07/19  9:44 PM  Result Value Ref Range Status   Specimen Description BLOOD BLOOD LEFT FOREARM  Final   Special Requests   Final    BOTTLES DRAWN AEROBIC AND ANAEROBIC Blood Culture adequate volume   Culture  Setup Time   Final    GRAM NEGATIVE RODS IN BOTH AEROBIC AND ANAEROBIC BOTTLES CRITICAL RESULT CALLED TO, READ BACK BY AND VERIFIED WITH: T GREEN,PHARMD AT 1738 05/08/2019 BY L BENFIELD    Culture (A)  Final    KLEBSIELLA PNEUMONIAE SUSCEPTIBILITIES PERFORMED ON PREVIOUS CULTURE WITHIN THE LAST 5 DAYS. Performed at [facility], [address] [place], [place] [address]    Report Status 05/10/2019 FINAL  Final  Urine culture     Status: None   Collection Time: 05/07/19 11:10 PM  Result Value Ref Range Status   Specimen Description URINE, RANDOM  Final   Special Requests  NONE  Final   Culture   Final    NO GROWTH Performed at [facility], [address] [place], [place] [address]    Report Status 05/09/2019 FINAL  Final  Respiratory Panel by PCR     Status: None   Collection Time: 05/08/19  4:57 PM  Result Value Ref Range Status   Adenovirus NOT DETECTED NOT DETECTED Final   Coronavirus 229E NOT DETECTED NOT DETECTED Final    Comment: (NOTE) The Coronavirus on the Respiratory Panel, DOES NOT test for the novel  Coronavirus (2019 nCoV)    Coronavirus HKU1 NOT DETECTED NOT DETECTED Final   Coronavirus NL63 NOT DETECTED NOT DETECTED Final   Coronavirus OC43 NOT DETECTED NOT DETECTED Final   Metapneumovirus NOT DETECTED NOT DETECTED Final   Rhinovirus / Enterovirus NOT DETECTED NOT DETECTED Final   Influenza A NOT DETECTED NOT DETECTED Final   Influenza B NOT DETECTED NOT DETECTED Final   Parainfluenza Virus 1 NOT DETECTED NOT DETECTED Final   Parainfluenza Virus 2 NOT DETECTED NOT DETECTED Final   Parainfluenza Virus 3 NOT DETECTED NOT DETECTED Final   Parainfluenza Virus 4 NOT DETECTED NOT DETECTED Final   Respiratory Syncytial Virus NOT DETECTED NOT DETECTED Final   Bordetella pertussis NOT DETECTED NOT DETECTED Final   Chlamydophila pneumoniae NOT DETECTED NOT DETECTED Final   Mycoplasma pneumoniae NOT DETECTED NOT DETECTED Final    Comment: Performed at [facility], [address] [place], [place] [address]  MRSA PCR Screening     Status: None   Collection Time: 05/09/19  5:37 PM  Result Value Ref Range Status   MRSA by PCR NEGATIVE NEGATIVE Final  Comment:        The GeneXpert MRSA Assay (FDA approved for NASAL specimens only), is one component of a comprehensive MRSA colonization surveillance program. It is not intended to diagnose MRSA infection nor to guide or monitor treatment for MRSA infections. Performed at [facility], [place] [address] [place], [place] [address]   Aerobic/Anaerobic Culture  (surgical/deep wound)     Status: None (Preliminary result)   Collection Time: 05/11/19  3:18 PM  Result Value Ref Range Status   Specimen Description ABSCESS GALL BLADDER  Final   Special Requests NONE  Final   Gram Stain   Final    RARE WBC PRESENT, PREDOMINANTLY MONONUCLEAR MODERATE GRAM POSITIVE COCCI Performed at [facility], [place] [address], [place], [place] [address]    Culture   Final    ABUNDANT ENTEROCOCCUS FAECALIS NO ANAEROBES ISOLATED; CULTURE IN PROGRESS FOR 5 DAYS    Report Status PENDING  Incomplete   Organism ID, Bacteria ENTEROCOCCUS FAECALIS  Final      Susceptibility   Enterococcus faecalis - MIC*    AMPICILLIN <=2 SENSITIVE Sensitive     VANCOMYCIN 2 SENSITIVE Sensitive     GENTAMICIN SYNERGY SENSITIVE Sensitive     * ABUNDANT ENTEROCOCCUS FAECALIS         Radiology Studies: No results found.      Scheduled Meds:  aspirin  150 mg Rectal Daily   chlorhexidine  15 mL Mouth Rinse BID   Chlorhexidine Gluconate Cloth  6 each Topical Daily   clopidogrel  75 mg Oral Daily   enoxaparin (LOVENOX) injection  40 mg Subcutaneous Q24H   insulin aspart  0-15 Units Subcutaneous TID WC   insulin aspart  0-5 Units Subcutaneous QHS   insulin detemir  38 Units Subcutaneous Daily   mouth rinse  15 mL Mouth Rinse q12n4p   sodium chloride flush  10-40 mL Intracatheter Q12H   sodium chloride flush  5 mL Intracatheter Q8H   tamsulosin  0.4 mg Oral Daily   Continuous Infusions:  sodium chloride 100 mL/hr at 05/15/19 1051   ampicillin-sulbactam (UNASYN) IV 3 g (05/15/19 0352)     LOS: 7 days     Georgette Shell, MD [facility] If 7PM-7AM, please contact night-coverage [email or web address] Password [email or web address] 05/15/2019, 12:09 PM

## 2019-05-16 DIAGNOSIS — R140 Abdominal distension (gaseous): Secondary | ICD-10-CM

## 2019-05-16 LAB — BASIC METABOLIC PANEL
Anion gap: 8 (ref 5–15)
BUN: 8 mg/dL (ref 6–20)
CO2: 28 mmol/L (ref 22–32)
Calcium: 7.8 mg/dL — ABNORMAL LOW (ref 8.9–10.3)
Chloride: 108 mmol/L (ref 98–111)
Creatinine, Ser: 0.86 mg/dL (ref 0.61–1.24)
GFR calc Af Amer: >60 mL/min (ref >60)
GFR calc non Af Amer: >60 mL/min (ref >60)
Glucose, Bld: 147 mg/dL — ABNORMAL HIGH (ref 70–99)
Potassium: 3.1 mmol/L — ABNORMAL LOW (ref 3.5–5.1)
Sodium: 144 mmol/L (ref 135–145)

## 2019-05-16 LAB — CBC WITH DIFFERENTIAL/PLATELET
Abs Immature Granulocytes: 0.2 10*3/uL — ABNORMAL HIGH (ref 0.00–0.07)
Basophils Absolute: 0.1 10*3/uL (ref 0.0–0.1)
Basophils Relative: 1 %
Eosinophils Absolute: 0.3 10*3/uL (ref 0.0–0.5)
Eosinophils Relative: 3 %
HCT: 38.5 % — ABNORMAL LOW (ref 39.0–52.0)
Hemoglobin: 12.1 g/dL — ABNORMAL LOW (ref 13.0–17.0)
Immature Granulocytes: 2 %
Lymphocytes Relative: 21 %
Lymphs Abs: 1.8 10*3/uL (ref 0.7–4.0)
MCH: 28.9 pg (ref 26.0–34.0)
MCHC: 31.4 g/dL (ref 30.0–36.0)
MCV: 92.1 fL (ref 80.0–100.0)
Monocytes Absolute: 0.5 10*3/uL (ref 0.1–1.0)
Monocytes Relative: 6 %
Neutro Abs: 5.7 10*3/uL (ref 1.7–7.7)
Neutrophils Relative %: 67 %
Platelets: 234 10*3/uL (ref 150–400)
RBC: 4.18 MIL/uL — ABNORMAL LOW (ref 4.22–5.81)
RDW: 16.6 % — ABNORMAL HIGH (ref 11.5–15.5)
WBC: 8.5 10*3/uL (ref 4.0–10.5)
nRBC: 0 % (ref 0.0–0.2)

## 2019-05-16 LAB — AEROBIC/ANAEROBIC CULTURE W GRAM STAIN (SURGICAL/DEEP WOUND)

## 2019-05-16 LAB — COMPREHENSIVE METABOLIC PANEL
ALT: 41 U/L (ref 0–44)
AST: 43 U/L — ABNORMAL HIGH (ref 15–41)
Albumin: 2.5 g/dL — ABNORMAL LOW (ref 3.5–5.0)
Alkaline Phosphatase: 85 U/L (ref 38–126)
Anion gap: 9 (ref 5–15)
BUN: 12 mg/dL (ref 6–20)
CO2: 30 mmol/L (ref 22–32)
Calcium: 7.9 mg/dL — ABNORMAL LOW (ref 8.9–10.3)
Chloride: 110 mmol/L (ref 98–111)
Creatinine, Ser: 0.91 mg/dL (ref 0.61–1.24)
GFR calc Af Amer: >60 mL/min (ref >60)
GFR calc non Af Amer: >60 mL/min (ref >60)
Glucose, Bld: 84 mg/dL (ref 70–99)
Potassium: 3 mmol/L — ABNORMAL LOW (ref 3.5–5.1)
Sodium: 149 mmol/L — ABNORMAL HIGH (ref 135–145)
Total Bilirubin: 1.8 mg/dL — ABNORMAL HIGH (ref 0.3–1.2)
Total Protein: 5.8 g/dL — ABNORMAL LOW (ref 6.5–8.1)

## 2019-05-16 LAB — GLUCOSE, CAPILLARY
Glucose-Capillary: 82 mg/dL (ref 70–99)
Glucose-Capillary: 141 mg/dL — ABNORMAL HIGH (ref 70–99)
Glucose-Capillary: 129 mg/dL — ABNORMAL HIGH (ref 70–99)
Glucose-Capillary: 129 mg/dL — ABNORMAL HIGH (ref 70–99)

## 2019-05-16 LAB — MAGNESIUM: Magnesium: 2.1 mg/dL (ref 1.7–2.4)

## 2019-05-16 MED ORDER — ASPIRIN EC 81 MG PO TBEC
81.0000 mg | DELAYED_RELEASE_TABLET | Freq: Every day | ORAL | Status: DC
  Administered 2019-05-16 – 2019-05-17 (×2): 81 mg via ORAL
  Filled 2019-05-16 (×2): qty 1

## 2019-05-16 MED ORDER — POTASSIUM CHLORIDE CRYS ER 20 MEQ PO TBCR
40.0000 meq | EXTENDED_RELEASE_TABLET | Freq: Once | ORAL | Status: AC
  Administered 2019-05-16: 40 meq via ORAL
  Filled 2019-05-16: qty 2

## 2019-05-16 NOTE — Progress Notes (Addendum)
Patient refused CPAP again at this time.  Continues on 2L .  Spo2 97%. Encouraged patient to call for Respiratory if he wishes to wear CPAP.

## 2019-05-16 NOTE — Progress Notes (Signed)
Patient refused CPAP for HS.  

## 2019-05-16 NOTE — Progress Notes (Signed)
PROGRESS NOTE    Johnathan Keith  ZOX:[id number] DOB: November 23, 1961 DOA: 05/07/2019 PCP: Shelle Iron, MD  Brief Narrative:58 year old obese male with acid reflux, hypertension, coronary artery disease and diabetes. He arrived in the emergency department May 07, 2019 because of fever, chills, generalized weakness and confusion associated with nausea and vomiting. Initially fever was 100.3 and tachycardic. His initial lactic acid was 3.9. His COVID-19 test was negative. According to the bedside nurse for most of the day on May 08, 2019 he had hypoactive delirium and significant sirs physiology. However today they feel he is somewhat improved with improved AKI and resolved lactic acidosis and improving LFT and HR and RR. However, because of his agitated delirium has been difficult to control and MEWS score is 6. Therefore critical care medicine has been consulted.  Evaluation is shown that GI and [facility] [place] [facility] have consulted on him and the unifying diagnosis is 1 of gallstone pancreatitis [admission diagnosis based on chemical lipase elevation of 1276] without evidence of acute cholecystitis according to consultation by the 2 clinicians. [his CT scan did not show any pancreatitis but suggested he had cholecystitis]. He has a small stone in the gallbladder neck without any biliary dilatation. Aggressive ICU medical management has been recommended.  Growing klebisella in blood  Assessment & Plan:   Principal Problem:   Sepsis (HCC) Active Problems:   Type 2 diabetes mellitus with vascular disease (HCC)   Hyperlipidemia   Essential hypertension   CAD -S/P PCI-2016   History of stroke   Fever   Transaminitis   Serum total bilirubin elevated   Acute pancreatitis   AKI (acute kidney injury) (HCC)   Cholecystitis   #1 acute gallstone cholecystitis and ascending cholangitis status post percutaneous cholecystostomy tube in place 05/11/2019 by interventional radiology. PT  recommends home PT.  Wife patient and family wants him to go home and have therapy.  They do not want him to go to a rehab facility.   Foley catheter DC'd 05/15/2019   #2 severe sepsis due to Klebsiella bacteremia and bile culture growing Enterococcus faecalis. Patient started on Unasyn 6 /4. Patient needs to continue inpatient hospital monitoring due to severe sepsis need of IV antibiotics and IV fluids.  We will switch him to Augmentin upon discharge to complete a course of 14 days.  #3 status post toxic metabolic encephalopathy improved status post Precedex drip.  Improved.  Wife reports he had memory issues since last April 2019.  #4 undiagnosed obstructive sleep apnea on CPAP in the hospital needs outpatient sleep study.  Patient to follow-up with the pulmonologist at [facility] to continue using CPAP at home.  #5 AKI improved with IV fluids.  #6 elevated troponin with CAD secondary to demand ischemia from sepsis. Restart Plavix.  #7 hypernatremia improving with fluids. Sodium 163 on admission down to 149 today.  #8 hypokalemia replete   #9 type 2 diabetes uncontrolled on insulin at home increase Levemir to 38.  Blood sugar better after increasing the dose of insulin.   #10 BPH restart Flomax.  Significant Hospital Events  5/29/20200 admit 5/30- GI consult 5/31 - CCS consult 5/31 - ccm consult and move to ICU. CT scan IMPRESSION: 1. Cholelithiasis with gallbladder wall thickening and pericholecystic fluid is concerning for ACUTE CHOLECYSTITIS. No significant gallbladder distension. 2. Common bile duct is normal. No evidence of pancreatitis.  Consults:  5/30- GI consult 5/31 - CCS consult 5/31 - ccm consult and move to ICU  Procedures:  05/11/2019 placement of percutaneous  cholecystostomy drain per interventional radiology  Significant Diagnostic Tests:  May 07, 2019 right upper quadrant ultrasound IMPRESSION: 1. 9 mm stone versus tumefactive sludge within  the gallbladder neck. No sonographic features to suggest acute cholecystitis. 2. No biliary dilatation. 3. Increased echogenicity within the hepatic parenchyma with question of subtle nodularity of the hepatic contour, which could reflect sequelae of cirrhosis and/or steatosis.  May 09, 2019 CT scan abdomen - IMPRESSION: 1. Cholelithiasis with gallbladder wall thickening and pericholecystic fluid is concerning for ACUTE CHOLECYSTITIS. No significant gallbladder distension. 2. Common bile duct is normal. No evidence of pancreatitis.  Micro Data:   Blood culture May 07, 2019 -growing Klebsiella pneumoniae COVID-19 and respiratory virus panel May 07, 2019-negative 6/2 gbENTEROCOCCUS FAECALI  Antimicrobials:  Vancomycin -May 30 Cefepime May 29>>off Flagyl -May 30>>6/3 Ancef 05/10/2011>>6/4 Unasyn 6/4>>   Estimated body mass index is 39.43 kg/m as calculated from the following:   Height as of this encounter: 5\' 6"  (1.676 m).   Weight as of this encounter: 110.8 kg.    Subjective:  RESTING IN BED no complaints very weak  Objective: Vitals:   05/16/19 0600 05/16/19 0630 05/16/19 0848 05/16/19 1205  BP:   (!) 142/81 (!) 157/85  Pulse: 85 77 92 84  Resp: 16 (!) 9 16 16   Temp:   98.1 F (36.7 C) (!) 97.3 F (36.3 C)  TempSrc:   Oral Oral  SpO2: 94% 96% 99% 96%  Weight:      Height:        Intake/Output Summary (Last 24 hours) at 05/16/2019 1223 Last data filed at 05/16/2019 1154 Gross per 24 hour  Intake 1439.63 ml  Output 1250 ml  Net 189.63 ml   Filed Weights   05/12/19 0135 05/13/19 0412 05/15/19 0319  Weight: 110.8 kg 109.6 kg 110.8 kg    Examination:  General exam: Appears calm and comfortable  Respiratory system: Clear to auscultation. Respiratory effort normal. Cardiovascular system: S1 & S2 heard, RRR. No JVD, murmurs, rubs, gallops or clicks. No pedal edema. Gastrointestinal system: Abdomen is nondistended, soft and nontender. No organomegaly or  masses felt. Normal bowel sounds heard.  Right upper quadrant drain in place with decreased amount of biliary drainage Central nervous system: Alert and oriented. No focal neurological deficits. Extremities: Symmetric 5 x 5 power. Skin: No rashes, lesions or ulcers Psychiatry: Judgement and insight appear normal. Mood & affect appropriate.     Data Reviewed: I have personally reviewed following labs and imaging studies  CBC: Recent Labs  Lab 05/10/19 0441 05/11/19 0347 05/11/19 2058 05/12/19 0308 05/13/19 0425 05/16/19 0531  WBC 7.2 8.1  --  8.2 9.2 8.5  NEUTROABS  --   --   --  6.1 7.3 5.7  HGB 12.9* 12.5* 11.9* 11.9* 11.6* 12.1*  HCT 41.6 40.8 35.0* 39.1 37.8* 38.5*  MCV 93.1 94.2  --  94.7 94.5 92.1  PLT 144* 166  --  151 147* 234   Basic Metabolic Panel: Recent Labs  Lab 05/11/19 0347  05/12/19 0308 05/12/19 1140 05/13/19 0425 05/14/19 0445 05/15/19 0559 05/16/19 0531  NA 158*   < > 160* 150* 149* 143 152* 149*  K 3.5   < > 3.2* 3.3* 3.0* 3.0* 3.1* 3.0*  CL 123*  --  125* 118* 113* 108 112* 110  CO2 25  --  25 24 26 28 30 30   GLUCOSE 193*  --  215* 451* 403* 424* 122* 84  BUN 35*  --  30* 26* 21*  15 12 12   CREATININE 1.65*  --  1.36* 1.15 1.07 1.05 0.97 0.91  CALCIUM 8.2*  --  8.3* 7.9* 7.8* 7.5* 7.9* 7.9*  MG 2.7*  --  2.6*  --  2.2 2.1  --  2.1  PHOS  --   --  1.6* 2.3*  --  1.9*  --   --    < > = values in this interval not displayed.   GFR: Estimated Creatinine Clearance: 102.1 mL/min (by C-G formula based on SCr of 0.91 mg/dL). Liver Function Tests: Recent Labs  Lab 05/10/19 0441 05/11/19 0347 05/14/19 0445 05/16/19 0531  AST 230* 169* 33 43*  ALT 358* 262* 52* 41  ALKPHOS 84 85 77 85  BILITOT 4.1* 3.3* 1.9* 1.8*  PROT 6.6 6.2* 5.7* 5.8*  ALBUMIN 2.6* 2.4* 2.3* 2.5*   Recent Labs  Lab 05/10/19 0441  LIPASE 17   No results for input(s): AMMONIA in the last 168 hours. Coagulation Profile: Recent Labs  Lab 05/10/19 0441  INR 1.3*    Cardiac Enzymes: Recent Labs  Lab 05/09/19 1830 05/10/19 0441 05/10/19 2145 05/11/19 0347 05/11/19 0947  TROPONINI 0.11* 0.15* 0.07* 0.08* 0.07*   BNP (last 3 results) No results for input(s): PROBNP in the last 8760 hours. HbA1C: No results for input(s): HGBA1C in the last 72 hours. CBG: Recent Labs  Lab 05/15/19 1152 05/15/19 1637 05/15/19 2143 05/16/19 0831 05/16/19 1202  GLUCAP 184* 119* 104* 82 141*   Lipid Profile: No results for input(s): CHOL, HDL, LDLCALC, TRIG, CHOLHDL, LDLDIRECT in the last 72 hours. Thyroid Function Tests: No results for input(s): TSH, T4TOTAL, FREET4, T3FREE, THYROIDAB in the last 72 hours. Anemia Panel: No results for input(s): VITAMINB12, FOLATE, FERRITIN, TIBC, IRON, RETICCTPCT in the last 72 hours. Sepsis Labs: Recent Labs  Lab 05/10/19 0441 05/11/19 0347  PROCALCITON 6.28 4.00  LATICACIDVEN 1.6  --     Recent Results (from the past 240 hour(s))  Blood Culture (routine x 2)     Status: Abnormal   Collection Time: 05/07/19  9:11 PM  Result Value Ref Range Status   Specimen Description BLOOD LEFT HAND  Final   Special Requests   Final    BOTTLES DRAWN AEROBIC AND ANAEROBIC Blood Culture adequate volume   Culture  Setup Time   Final    GRAM NEGATIVE RODS IN BOTH AEROBIC AND ANAEROBIC BOTTLES CRITICAL RESULT CALLED TO, READ BACK BY AND VERIFIED WITH: PHARMD ADREW Judie PetitM. [phone] [id number] FCP  Performed at [facility], [address] [place], [place] [address]    Culture KLEBSIELLA PNEUMONIAE (A)  Final   Report Status 05/10/2019 FINAL  Final   Organism ID, Bacteria KLEBSIELLA PNEUMONIAE  Final      Susceptibility   Klebsiella pneumoniae - MIC*    AMPICILLIN RESISTANT Resistant     CEFAZOLIN <=4 SENSITIVE Sensitive     CEFEPIME <=1 SENSITIVE Sensitive     CEFTAZIDIME <=1 SENSITIVE Sensitive     CEFTRIAXONE <=1 SENSITIVE Sensitive     CIPROFLOXACIN <=0.25 SENSITIVE Sensitive     GENTAMICIN <=1 SENSITIVE Sensitive      IMIPENEM <=0.25 SENSITIVE Sensitive     TRIMETH/SULFA <=20 SENSITIVE Sensitive     AMPICILLIN/SULBACTAM 4 SENSITIVE Sensitive     PIP/TAZO <=4 SENSITIVE Sensitive     Extended ESBL NEGATIVE Sensitive     * KLEBSIELLA PNEUMONIAE  Blood Culture ID Panel (Reflexed)     Status: Abnormal   Collection Time: 05/07/19  9:11 PM  Result Value Ref Range Status   Enterococcus species NOT DETECTED NOT DETECTED Final   Listeria monocytogenes NOT DETECTED NOT DETECTED Final   Staphylococcus species NOT DETECTED NOT DETECTED Final   Staphylococcus aureus (BCID) NOT DETECTED NOT DETECTED Final   Streptococcus species NOT DETECTED NOT DETECTED Final   Streptococcus agalactiae NOT DETECTED NOT DETECTED Final   Streptococcus pneumoniae NOT DETECTED NOT DETECTED Final   Streptococcus pyogenes NOT DETECTED NOT DETECTED Final   Acinetobacter baumannii NOT DETECTED NOT DETECTED Final   Enterobacteriaceae species DETECTED (A) NOT DETECTED Final    Comment: Enterobacteriaceae represent a large family of gram-negative bacteria, not a single organism. CRITICAL RESULT CALLED TO, READ BACK BY AND VERIFIED WITH: [place]. [phone] [id number] FCP     Enterobacter cloacae complex NOT DETECTED NOT DETECTED Final   Escherichia coli NOT DETECTED NOT DETECTED Final   Klebsiella oxytoca NOT DETECTED NOT DETECTED Final   Klebsiella pneumoniae DETECTED (A) NOT DETECTED Final    Comment: CRITICAL RESULT CALLED TO, READ BACK BY AND VERIFIED WITH: PHARMD ADREW M. [phone] FCP     Proteus species NOT DETECTED NOT DETECTED Final   Serratia marcescens NOT DETECTED NOT DETECTED Final   Carbapenem resistance NOT DETECTED NOT DETECTED Final   Haemophilus influenzae NOT DETECTED NOT DETECTED Final   Neisseria meningitidis NOT DETECTED NOT DETECTED Final   Pseudomonas aeruginosa NOT DETECTED NOT DETECTED Final   Candida albicans NOT DETECTED NOT DETECTED Final   Candida glabrata NOT DETECTED NOT DETECTED Final   Candida krusei  NOT DETECTED NOT DETECTED Final   Candida parapsilosis NOT DETECTED NOT DETECTED Final   Candida tropicalis NOT DETECTED NOT DETECTED Final    Comment: Performed at [facility], [place]. [address] [place],  [address]  SARS Coronavirus 2 (CEPHEID- Performed in [place] hospital lab), Hosp Order     Status: None   Collection Time: 05/07/19  9:26 PM  Result Value Ref Range Status   SARS Coronavirus 2 NEGATIVE NEGATIVE Final    Comment: (NOTE) If result is NEGATIVE SARS-CoV-2 target nucleic acids are NOT DETECTED. The SARS-CoV-2 RNA is generally detectable in upper and lower  respiratory specimens during the acute phase of infection. The lowest  concentration of SARS-CoV-2 viral copies this assay can detect is 250  copies / mL. A negative result does not preclude SARS-CoV-2 infection  and should not be used as the sole basis for treatment or other  patient management decisions.  A negative result may occur with  improper specimen collection / handling, submission of specimen other  than nasopharyngeal swab, presence of viral mutation(s) within the  areas targeted by this assay, and inadequate number of viral copies  (<250 copies / mL). A negative result must be combined with clinical  observations, patient history, and epidemiological information. If result is POSITIVE SARS-CoV-2 target nucleic acids are DETECTED. The SARS-CoV-2 RNA is generally detectable in upper and lower  respiratory specimens dur ing the acute phase of infection.  Positive  results are indicative of active infection with SARS-CoV-2.  Clinical  correlation with patient history and other diagnostic information is  necessary to determine patient infection status.  Positive results do  not rule out bacterial infection or co-infection with other viruses. If result is PRESUMPTIVE POSTIVE SARS-CoV-2 nucleic acids MAY BE PRESENT.   A presumptive positive result was obtained on the submitted specimen  and  confirmed on repeat testing.  While 2019 novel coronavirus  (SARS-CoV-2) nucleic acids may  be present in the submitted sample  additional confirmatory testing may be necessary for epidemiological  and / or clinical management purposes  to differentiate between  SARS-CoV-2 and other Sarbecovirus currently known to infect humans.  If clinically indicated additional testing with an alternate test  methodology [phone]) is advised. The SARS-CoV-2 RNA is generally  detectable in upper and lower respiratory sp ecimens during the acute  phase of infection. The expected result is Negative. Fact Sheet for Patients:  BoilerBrush.com.cy Fact Sheet for Healthcare Providers: https://pope.com/ This test is not yet approved or cleared by the [place] FDA and has been authorized for detection and/or diagnosis of SARS-CoV-2 by FDA under an Emergency Use Authorization (EUA).  This EUA will remain in effect (meaning this test can be used) for the duration of the COVID-19 declaration under Section 564(b)(1) of the Act, 21 U.S.C. section 360bbb-3(b)(1), unless the authorization is terminated or revoked sooner. Performed at [facility], [address] [place], [place] [address]   Blood Culture (routine x 2)     Status: Abnormal   Collection Time: 05/07/19  9:44 PM  Result Value Ref Range Status   Specimen Description BLOOD BLOOD LEFT FOREARM  Final   Special Requests   Final    BOTTLES DRAWN AEROBIC AND ANAEROBIC Blood Culture adequate volume   Culture  Setup Time   Final    GRAM NEGATIVE RODS IN BOTH AEROBIC AND ANAEROBIC BOTTLES CRITICAL RESULT CALLED TO, READ BACK BY AND VERIFIED WITH: T GREEN,PHARMD AT 1738 05/08/2019 BY L BENFIELD    Culture (A)  Final    KLEBSIELLA PNEUMONIAE SUSCEPTIBILITIES PERFORMED ON PREVIOUS CULTURE WITHIN THE LAST 5 DAYS. Performed at [facility], [address] [place], [place] [address]    Report  Status 05/10/2019 FINAL  Final  Urine culture     Status: None   Collection Time: 05/07/19 11:10 PM  Result Value Ref Range Status   Specimen Description URINE, RANDOM  Final   Special Requests NONE  Final   Culture   Final    NO GROWTH Performed at [facility], [address], [place], [place] [address]    Report Status 05/09/2019 FINAL  Final  Respiratory Panel by PCR     Status: None   Collection Time: 05/08/19  4:57 PM  Result Value Ref Range Status   Adenovirus NOT DETECTED NOT DETECTED Final   Coronavirus 229E NOT DETECTED NOT DETECTED Final    Comment: (NOTE) The Coronavirus on the Respiratory Panel, DOES NOT test for the novel  Coronavirus (2019 nCoV)    Coronavirus HKU1 NOT DETECTED NOT DETECTED Final   Coronavirus NL63 NOT DETECTED NOT DETECTED Final   Coronavirus OC43 NOT DETECTED NOT DETECTED Final   Metapneumovirus NOT DETECTED NOT DETECTED Final   Rhinovirus / Enterovirus NOT DETECTED NOT DETECTED Final   Influenza A NOT DETECTED NOT DETECTED Final   Influenza B NOT DETECTED NOT DETECTED Final   Parainfluenza Virus 1 NOT DETECTED NOT DETECTED Final   Parainfluenza Virus 2 NOT DETECTED NOT DETECTED Final   Parainfluenza Virus 3 NOT DETECTED NOT DETECTED Final   Parainfluenza Virus 4 NOT DETECTED NOT DETECTED Final   Respiratory Syncytial Virus NOT DETECTED NOT DETECTED Final   Bordetella pertussis NOT DETECTED NOT DETECTED Final   Chlamydophila pneumoniae NOT DETECTED NOT DETECTED Final   Mycoplasma pneumoniae NOT DETECTED NOT DETECTED Final    Comment: Performed at [facility], [address] [place],  [place] [id number]  MRSA PCR Screening     Status: None   Collection Time: 05/09/19  5:37 PM  Result Value Ref Range Status   MRSA by PCR NEGATIVE NEGATIVE Final    Comment:        The GeneXpert MRSA Assay (FDA approved for NASAL specimens only), is one component of a comprehensive MRSA colonization surveillance program. It is not intended to  diagnose MRSA infection nor to guide or monitor treatment for MRSA infections. Performed at [facility], [address] [place], [place] [address]   Aerobic/Anaerobic Culture (surgical/deep wound)     Status: None   Collection Time: 05/11/19  3:18 PM  Result Value Ref Range Status   Specimen Description ABSCESS GALL BLADDER  Final   Special Requests NONE  Final   Gram Stain   Final    RARE WBC PRESENT, PREDOMINANTLY MONONUCLEAR MODERATE GRAM POSITIVE COCCI    Culture   Final    ABUNDANT ENTEROCOCCUS FAECALIS NO ANAEROBES ISOLATED Performed at [facility], [address] [place], [place] [address]    Report Status 05/16/2019 FINAL  Final   Organism ID, Bacteria ENTEROCOCCUS FAECALIS  Final      Susceptibility   Enterococcus faecalis - MIC*    AMPICILLIN <=2 SENSITIVE Sensitive     VANCOMYCIN 2 SENSITIVE Sensitive     GENTAMICIN SYNERGY SENSITIVE Sensitive     * ABUNDANT ENTEROCOCCUS FAECALIS         Radiology Studies: No results found.      Scheduled Meds:  aspirin EC  81 mg Oral Daily   chlorhexidine  15 mL Mouth Rinse BID   Chlorhexidine Gluconate Cloth  6 each Topical Daily   clopidogrel  75 mg Oral Daily   enoxaparin (LOVENOX) injection  40 mg Subcutaneous Q24H   insulin aspart  0-15 Units Subcutaneous TID WC   insulin aspart  0-5 Units Subcutaneous QHS   insulin detemir  38 Units Subcutaneous Daily   mouth rinse  15 mL Mouth Rinse q12n4p   sodium chloride flush  10-40 mL Intracatheter Q12H   sodium chloride flush  5 mL Intracatheter Q8H   tamsulosin  0.4 mg Oral Daily   Continuous Infusions:  sodium chloride 100 mL/hr at 05/16/19 0641   ampicillin-sulbactam (UNASYN) IV 3 g (05/16/19 0458)     LOS: 8 days     Alwyn RenElizabeth G Nesta Kimple, MD [facility]  If 7PM-7AM, please contact night-coverage [email or web address] Password [email or web address] 05/16/2019, 12:23 PM

## 2019-05-16 NOTE — Progress Notes (Signed)
Subjective: Alert.  Stable.  No distress. Oriented to person and place but not to situation.  He does not understand that he has a gallbladder drain. Remains afebrile.  Hemodynamically stable. High output from cholecystostomy tube but clear bile. Lab work today pending  Objective: Vital signs in last 24 hours: Temp:  [97.5 F (36.4 C)-98.2 F (36.8 C)] 98.2 F (36.8 C) (06/07 0347) Pulse Rate:  [77-91] 77 (06/07 0400) Resp:  [11-18] 17 (06/07 0400) BP: (92-164)/(60-88) 164/73 (06/07 0347) SpO2:  [85 %-99 %] 96 % (06/07 0400) Last BM Date: 05/14/19  Intake/Output from previous day: 06/06 0701 - 06/07 0700 In: 1609.6 [P.O.:840; I.V.:414.6; IV Piggyback:200] Out: 1600 [Urine:1050; Drains:550] Intake/Output this shift: Total I/O In: 490 [P.O.:240; Other:150; IV Piggyback:100] Out: 700 [Urine:450; Drains:250]    PE: Gen:NAD, alert, cooperative, deconditioned.  Not toxic.  Marland Kitchen insight marginal.  Reticent. HEENT: PERRL, on room air Pulm:rate and effort normal Abd: Soft,obese, soft.  RUQ drain with clear bilious drainage.550 cc/24 hr. Skin: warm and dry Neuro: followingcommands   Lab Results:  Recent Labs    05/16/19 0531  WBC 8.5  HGB 12.1*  HCT 38.5*  PLT 234   BMET Recent Labs    05/14/19 0445 05/15/19 0559  NA 143 152*  K 3.0* 3.1*  CL 108 112*  CO2 28 30  GLUCOSE 424* 122*  BUN 15 12  CREATININE 1.05 0.97  CALCIUM 7.5* 7.9*   PT/INR No results for input(s): LABPROT, INR in the last 72 hours. ABG No results for input(s): PHART, HCO3 in the last 72 hours.  Invalid input(s): PCO2, PO2  Studies/Results: No results found.  Anti-infectives: Anti-infectives (From admission, onward)   Start     Dose/Rate Route Frequency Ordered Stop   05/13/19 1200  Ampicillin-Sulbactam (UNASYN) 3 g in sodium chloride 0.9 % 100 mL IVPB     3 g 200 mL/hr over 30 Minutes Intravenous Every 8 hours 05/13/19 1134     05/10/19 1600  vancomycin (VANCOCIN) 1,250 mg  in sodium chloride 0.9 % 250 mL IVPB  Status:  Discontinued     1,250 mg 166.7 mL/hr over 90 Minutes Intravenous Every 24 hours 05/09/19 1453 05/09/19 1801   05/10/19 1400  ceFAZolin (ANCEF) IVPB 1 g/50 mL premix  Status:  Discontinued     1 g 100 mL/hr over 30 Minutes Intravenous Every 8 hours 05/10/19 0910 05/10/19 1017   05/10/19 1400  ceFAZolin (ANCEF) IVPB 2g/100 mL premix  Status:  Discontinued     2 g 200 mL/hr over 30 Minutes Intravenous Every 8 hours 05/10/19 1017 05/13/19 1134   05/08/19 1730  metroNIDAZOLE (FLAGYL) IVPB 500 mg  Status:  Discontinued     500 mg 100 mL/hr over 60 Minutes Intravenous Every 8 hours 05/08/19 1657 05/12/19 1057   05/08/19 1000  vancomycin (VANCOCIN) 1,750 mg in sodium chloride 0.9 % 500 mL IVPB  Status:  Discontinued     1,750 mg 250 mL/hr over 120 Minutes Intravenous Every 12 hours 05/07/19 2153 05/09/19 1453   05/08/19 0600  ceFEPIme (MAXIPIME) 2 g in sodium chloride 0.9 % 100 mL IVPB  Status:  Discontinued     2 g 200 mL/hr over 30 Minutes Intravenous Every 8 hours 05/07/19 2153 05/10/19 0910   05/07/19 2130  ceFEPIme (MAXIPIME) 2 g in sodium chloride 0.9 % 100 mL IVPB     2 g 200 mL/hr over 30 Minutes Intravenous  Once 05/07/19 2115 05/07/19 2232   05/07/19 2130  vancomycin (VANCOCIN)  IVPB 1000 mg/200 mL premix  Status:  Discontinued     1,000 mg 200 mL/hr over 60 Minutes Intravenous  Once 05/07/19 2115 05/07/19 2120   05/07/19 2130  vancomycin (VANCOCIN) 1,500 mg in sodium chloride 0.9 % 500 mL IVPB     1,500 mg 250 mL/hr over 120 Minutes Intravenous  Once 05/07/19 2120 05/08/19 0040     Assessment/Plan:   CAD GERD Hx of MI HLD HTN PVD Hx of stroke, left sided weakness--- on Lovenox and Plavix.  Sepsis  Bacteremia - Klebsiella pneumoniae bacteremia, enterococcus from bile.  continue Unasyn Gallstone pancreatitis with probable acute cholecystitis -S/Ppercchole drain placement by IR, 06/02 - drain bilious,cultures show  Enterococcus faecalis... High output -LFT'sWNLand tbili trending down--repeat LFTs today. - lipase 17 on 06/01 -we will follow Toxic metabolic encephalopathy.  Agitation resolved.  Partially oriented  FEN:FLD, adv to carb mod as tolerated VTE: SCD's, lovenox JF:[id number] 05/29-06/01 Flagyl 05/30-06/04Ancef 06/01-06/04 Unasyn 06/04>> WBC WNL, afebrile Foley:yes per medicine Follow LS:LHTDSKA will plan laparoscopic cholecystectomy in 6 to 8 weeks after medical clearance as outpatient.  Dispo:advance diet as tolerated.Will plan for lap chole as an outpt.,  Assuming risk assessment and medical clearance satisfactory.             Poorly motivated and sedentary.  Needs aggressive PT.   LOS: 8 days    Johnathan Keith 05/16/2019

## 2019-05-16 NOTE — Progress Notes (Signed)
Daily Nursing Note  Received report from [place], Therapist, sports. Patient was resting comfortably in chair when I arrived in the room. Patient denied pain. Patient was unsteady on his feet and required 2 assist to ambulate from chair to bed. Patient ambulated back to chair easily, with assistance. (+) e/o nausea in early evening remedied with zofran. mIVF of 1/2NS at 130m/hr continued for poor PO intake All patient needs met throughout the day.  Note created by HGriffin Basil Nurse Extern

## 2019-05-17 DIAGNOSIS — R509 Fever, unspecified: Secondary | ICD-10-CM

## 2019-05-17 LAB — CBC
HCT: 38.5 % — ABNORMAL LOW (ref 39.0–52.0)
Hemoglobin: 12.1 g/dL — ABNORMAL LOW (ref 13.0–17.0)
MCH: 28.6 pg (ref 26.0–34.0)
MCHC: 31.4 g/dL (ref 30.0–36.0)
MCV: 91 fL (ref 80.0–100.0)
Platelets: 269 10*3/uL (ref 150–400)
RBC: 4.23 MIL/uL (ref 4.22–5.81)
RDW: 16.2 % — ABNORMAL HIGH (ref 11.5–15.5)
WBC: 11.7 10*3/uL — ABNORMAL HIGH (ref 4.0–10.5)
nRBC: 0 % (ref 0.0–0.2)

## 2019-05-17 LAB — COMPREHENSIVE METABOLIC PANEL
ALT: 39 U/L (ref 0–44)
AST: 45 U/L — ABNORMAL HIGH (ref 15–41)
Albumin: 2.5 g/dL — ABNORMAL LOW (ref 3.5–5.0)
Alkaline Phosphatase: 104 U/L (ref 38–126)
Anion gap: 8 (ref 5–15)
BUN: 9 mg/dL (ref 6–20)
CO2: 29 mmol/L (ref 22–32)
Calcium: 8 mg/dL — ABNORMAL LOW (ref 8.9–10.3)
Chloride: 106 mmol/L (ref 98–111)
Creatinine, Ser: 0.95 mg/dL (ref 0.61–1.24)
GFR calc Af Amer: >60 mL/min (ref >60)
GFR calc non Af Amer: >60 mL/min (ref >60)
Glucose, Bld: 128 mg/dL — ABNORMAL HIGH (ref 70–99)
Potassium: 3 mmol/L — ABNORMAL LOW (ref 3.5–5.1)
Sodium: 143 mmol/L (ref 135–145)
Total Bilirubin: 1.7 mg/dL — ABNORMAL HIGH (ref 0.3–1.2)
Total Protein: 6.1 g/dL — ABNORMAL LOW (ref 6.5–8.1)

## 2019-05-17 LAB — GLUCOSE, CAPILLARY
Glucose-Capillary: 96 mg/dL (ref 70–99)
Glucose-Capillary: 137 mg/dL — ABNORMAL HIGH (ref 70–99)

## 2019-05-17 MED ORDER — AMOXICILLIN-POT CLAVULANATE 875-125 MG PO TABS: 1.0000 | ORAL_TABLET | Freq: Two times a day (BID) | ORAL | 0 refills | Status: AC

## 2019-05-17 MED ORDER — POTASSIUM CHLORIDE CRYS ER 20 MEQ PO TBCR: 40.0000 meq | EXTENDED_RELEASE_TABLET | Freq: Every day | ORAL | Status: DC

## 2019-05-17 MED ORDER — POTASSIUM CHLORIDE CRYS ER 20 MEQ PO TBCR: 40.0000 meq | EXTENDED_RELEASE_TABLET | Freq: Every day | ORAL | 0 refills | Status: DC

## 2019-05-17 MED ORDER — POTASSIUM CHLORIDE CRYS ER 20 MEQ PO TBCR
40.0000 meq | EXTENDED_RELEASE_TABLET | Freq: Once | ORAL | Status: AC
  Administered 2019-05-17: 40 meq via ORAL
  Filled 2019-05-17: qty 2

## 2019-05-17 NOTE — Progress Notes (Signed)
Patient suffers from weakness which impairs their ability to perform daily activities like bathing, dressing in the home. A walker will not resolve  issue with performing activities of daily living. A wheelchair will allow patient to safely perform daily activities. Patient is not able to propel themselves in the home using a standard weight wheelchair due to weakness. Patient can self propel in the lightweight wheelchair. Length of need: lifetime. Accessories: elevating leg rests (ELRs), wheel locks, extensions and anti-tippers.

## 2019-05-17 NOTE — Progress Notes (Signed)
Referring Physician(s): Gaynelle Adu  Supervising Physician: Ruel Favors  Patient Status:  [facility] - In-pt  Chief Complaint: None  Subjective:  Cholecystitis s/p percutaneous cholecystostomy drain placement 05/10/2001 by Dr. Miles Costain. Patient awake and alert laying in bed working with PT with no complaints at this time. Cholecystostomy drain site c/d/i.   Allergies: Patient has no known allergies.  Medications: Prior to Admission medications   Medication Sig Start Date End Date Taking? Authorizing Provider  albuterol (PROVENTIL HFA;VENTOLIN HFA) 108 (90 Base) MCG/ACT inhaler Inhale 1 puff into the lungs every 6 (six) hours as needed for wheezing or shortness of breath.   Yes [provider]  aspirin 81 MG tablet Take 1 tablet (81 mg total) by mouth daily. 07/21/18  Yes George Hugh, NP  Cholecalciferol (VITAMIN D) 2000 UNITS tablet Take 2,000 Units by mouth daily.   Yes [provider]  clopidogrel (PLAVIX) 75 MG tablet Take 1 tablet by mouth once daily 05/04/19  Yes Runell Gess, MD  cyclobenzaprine (FLEXERIL) 5 MG tablet Take 5 mg by mouth daily as needed for muscle spasms.    Yes [provider]  dicyclomine (BENTYL) 10 MG capsule TAKE 1 CAPSULE BY MOUTH 4 TIMES DAILY Patient taking differently: Take 10 mg by mouth 4 (four) times daily -  before meals and at bedtime. Taking once daily in the AM 12/21/18  Yes Lynann Bologna, MD  fluocinonide ointment (LIDEX) 0.05 % Apply 1 application topically as needed. Apply to affected areas for infection 03/23/19  Yes [provider]  gabapentin (NEURONTIN) 100 MG capsule Take 1 capsule (100 mg total) by mouth 3 (three) times daily. Patient taking differently: Take 300 mg by mouth at bedtime.  06/15/17  Yes Marvel Plan, MD  glimepiride (AMARYL) 4 MG tablet Take 4 mg by mouth 2 (two) times daily. 10/18/16  Yes [provider]  hydrochlorothiazide (MICROZIDE) 12.5 MG capsule Take 12.5 mg by mouth  daily.    Yes [provider]  insulin NPH Human (HUMULIN N,NOVOLIN N) 100 UNIT/ML injection Inject into the skin as directed. Pt takes 40 units in the morning and 55 units at bedtime 06/23/17  Yes [provider]  insulin regular (NOVOLIN R) 100 units/mL injection Inject 10-20 Units into the skin 3 (three) times daily before meals.   Yes [provider]  ipratropium-albuterol (DUONEB) 0.5-2.5 (3) MG/3ML SOLN Inhale 3 mLs into the lungs 4 (four) times daily as needed for wheezing or shortness of breath. 12/23/18  Yes [provider]  loperamide (IMODIUM) 2 MG capsule Take 2-4 mg by mouth as needed for diarrhea or loose stools.    Yes [provider]  loratadine (CLARITIN) 10 MG tablet Take 10 mg by mouth daily. 04/11/19  Yes [provider]  metoprolol succinate (TOPROL-XL) 25 MG 24 hr tablet Take 3 tablets (75 mg total) by mouth daily. Patient taking differently: Take 25 mg by mouth 2 (two) times a day.  05/15/18  Yes Runell Gess, MD  nitroGLYCERIN (NITROSTAT) 0.4 MG SL tablet Place 1 tablet (0.4 mg total) under the tongue every 5 (five) minutes as needed for chest pain. 08/18/18  Yes Runell Gess, MD  oxybutynin (DITROPAN) 5 MG tablet Take 5 mg by mouth at bedtime. Do not with Dicyclomine 04/20/19  Yes [provider]  pantoprazole (PROTONIX) 40 MG tablet Take 1 tablet (40 mg total) by mouth daily. 03/24/18  Yes Lynann Bologna, MD  pravastatin (PRAVACHOL) 20 MG tablet Take 1 tablet (  20 mg total) by mouth at bedtime. 08/18/18  Yes Lorretta Harp, MD  SYMBICORT 160-4.5 MCG/ACT inhaler Inhale 2 puffs into the lungs 2 (two) times daily. 04/06/19  Yes [provider]  tamsulosin (FLOMAX) 0.4 MG CAPS capsule Take 1 capsule (0.4 mg total) by mouth daily. 09/27/14  Yes Rhyne, Samantha J, PA-C  amoxicillin-clavulanate (AUGMENTIN) 875-125 MG tablet Take 1 tablet by mouth 2 (two) times daily for 10 days. 05/17/19 05/27/19  Georgette Shell, MD  azelastine (ASTELIN) 0.1 % nasal spray Place 1 spray into both nostrils as directed. Use in each nostril as directed    [provider]  benazepril (LOTENSIN) 10 MG tablet TAKE 2 tablets by mouth TWICE daily Patient not taking: Reported on 05/08/2019 08/18/18   Lorretta Harp, MD  cefdinir (OMNICEF) 300 MG capsule Take 1 capsule by mouth 2 (two) times daily. 08/17/18   [provider]  dicyclomine (BENTYL) 10 MG capsule Take 10 mg by mouth 4 (four) times daily.  04/22/15   [provider]  furosemide (LASIX) 80 MG tablet Take 1 tablet BY MOUTH DAILY Patient not taking: Reported on 05/08/2019 08/18/18   Lorretta Harp, MD  hydrALAZINE (APRESOLINE) 25 MG tablet Take 1 tablet (25 mg total) by mouth 3 (three) times daily. Patient not taking: Reported on 05/08/2019 05/15/18   Lorretta Harp, MD  insulin regular (NOVOLIN R) 100 units/mL injection daily before breakfast. Sliding Scale    [provider]  Pancrelipase, Lip-Prot-Amyl, (ZENPEP) 40000-126000 units CPEP Take 1 capsule by mouth 3 (three) times daily with meals. Patient not taking: Reported on 05/08/2019 05/25/18   Jackquline Denmark, MD  potassium chloride SA (K-DUR) 20 MEQ tablet Take 2 tablets (40 mEq total) by mouth daily for 7 days. 05/17/19 05/24/19  Georgette Shell, MD  ranitidine (ZANTAC) 75 MG tablet Take 75 mg by mouth at bedtime.    [provider]     Vital Signs: BP (!) 159/74 (BP Location: Left Arm)   Pulse 89   Temp 98.6 F (37 C) (Oral)   Resp 18   Ht 5\' 6"  (1.676 m)   Wt 245 lb 2.4 oz (111.2 kg)   SpO2 95%   BMI 39.57 kg/m   Physical Exam Vitals signs and nursing note reviewed.  Constitutional:      General: He is not in acute distress.    Appearance: Normal appearance.  Pulmonary:     Effort: Pulmonary effort is normal. No respiratory distress.  Abdominal:     Palpations: Abdomen is soft.     Comments: Cholecystostomy site without tenderness, erythema, bleeding,  or drainage; approximately 75 cc of thick, bilious brown/green fluid in gravity bag; drain flushes without resistance.  Skin:    General: Skin is warm and dry.  Neurological:     Mental Status: He is alert.  Psychiatric:        Mood and Affect: Mood normal.        Behavior: Behavior normal.        Thought Content: Thought content normal.        Judgment: Judgment normal.     Imaging: No results found.  Labs:  CBC: Recent Labs    05/12/19 0308 05/13/19 0425 05/16/19 0531 05/17/19 0551  WBC 8.2 9.2 8.5 11.7*  HGB 11.9* 11.6* 12.1* 12.1*  HCT 39.1 37.8* 38.5* 38.5*  PLT 151 147* 234 269    COAGS: Recent Labs    05/10/19 0441  INR 1.3*  BMP: Recent Labs    05/15/19 0559 05/16/19 0531 05/16/19 1258 05/17/19 0551  NA 152* 149* 144 143  K 3.1* 3.0* 3.1* 3.0*  CL 112* 110 108 106  CO2 30 30 28 29   GLUCOSE 122* 84 147* 128*  BUN 12 12 8 9   CALCIUM 7.9* 7.9* 7.8* 8.0*  CREATININE 0.97 0.91 0.86 0.95  GFRNONAA >60 >60 >60 >60  GFRAA >60 >60 >60 >60    LIVER FUNCTION TESTS: Recent Labs    05/11/19 0347 05/14/19 0445 05/16/19 0531 05/17/19 0551  BILITOT 3.3* 1.9* 1.8* 1.7*  AST 169* 33 43* 45*  ALT 262* 52* 41 39  ALKPHOS 85 77 85 104  PROT 6.2* 5.7* 5.8* 6.1*  ALBUMIN 2.4* 2.3* 2.5* 2.5*    Assessment and Plan:  Cholecystitis s/p percutaneous cholecystostomy drain placement 05/10/2001 by Dr. Miles CostainShick. Patients percutaneous cholecystostomy drain stable with approximately 75 cc in gravity bag. Continue with Qshift flushes/monitor of output. Once discharged, continue with daily flushes of 10 cc NS and monitor of output- please order 10 cc NS flushes upon discharge. Plan to follow-up in drain clinic 6-8 weeks after discharge for drain injection- order sent to clinic to arrange visit. Please call IR with questions/concerns.   Electronically Signed: Elwin MochaAlexandra Wyley Hack, PA-C 05/17/2019, 11:48 AM   I spent a total of 25 Minutes at the the patient's bedside AND  on the patient's hospital floor or unit, greater than 50% of which was counseling/coordinating care for cholecystitis s/p percutaneous cholecystostomy placement.

## 2019-05-17 NOTE — Discharge Summary (Addendum)
Physician Discharge Summary  Johnathan RoyalsGrady Keith ZOX:[id number] DOB: [birth year] DOA: 05/07/2019  PCP: Shelle IronSistasis, [name], MD  Admit date: 05/07/2019 Discharge date: 05/17/2019  Admitted From:home Disposition:  home  Recommendations for Outpatient Follow-up:  1. Follow up with PCP in 1-2 weeks 2. Please obtain BMP/CBC in one week 3. Please follow up with surgeon 4. Follow up with IR   Home Health YES Equipment/Devices: None Discharge Condition stable CODE STATUS full code Diet recommendation: Cardiac  Brief/Interim Summary:58 year old obese male with acid reflux, hypertension, coronary artery disease and diabetes. He arrived in the emergency department May 07, 2019 because of fever, chills, generalized weakness and confusion associated with nausea and vomiting. Initially fever was 100.3 and tachycardic. His initial lactic acid was 3.9. His COVID-19 test was negative. According to the bedside nurse for most of the day on May 08, 2019 he had hypoactive delirium and significant sirs physiology. However today they feel he is somewhat improved with improved AKI and resolved lactic acidosis and improving LFT and HR and RR. However, because of his agitated delirium has been difficult to control and MEWS score is 6. Therefore critical care medicine has been consulted.  Evaluation is shown that GI and [place] [facility] have consulted on him and the unifying diagnosis is 1 of gallstone pancreatitis [admission diagnosis based on chemical lipase elevation of 1276] without evidence of acute cholecystitis according to consultation by the 2 clinicians. [his CT scan did not show any pancreatitis but suggested he had cholecystitis]. He has a small stone in the gallbladder neck without any biliary dilatation. Aggressive ICU medical management has been recommended.  Growing klebisella in blood  Discharge Diagnoses:  Principal Problem:   Sepsis (HCC) Active Problems:   Type 2 diabetes mellitus with  vascular disease (HCC)   Hyperlipidemia   Essential hypertension   CAD -S/P PCI-2016   History of stroke   Fever   Transaminitis   Serum total bilirubin elevated   Acute pancreatitis   AKI (acute kidney injury) (HCC)   Cholecystitis   Abdominal distention  #1 acute gallstone cholecystitis and ascending cholangitis status post percutaneous cholecystostomy tube in place 05/11/2019 by interventional radiology.  Continue with daily flushes of 10 cc normal saline and monitor output. PT recommends home PT.  Wife patient and family wants him to go home and have therapy.  They do not want him to go to a rehab facility.  Foley catheter DC'd 05/15/2019.  Wife and patient refused to go to rehab.  Patient to follow-up with interventional radiology in 6 weeks to assess the drain.  #2 severe sepsis due to Klebsiella bacteremia and bile culture growing Enterococcus faecalis. Patient started on Unasyn 6 /4.  We will switch him to Augmentin upon discharge to complete a course of 14 days.  #3 status post toxic metabolic encephalopathy improved status post Precedex drip.  Improved.  Wife reports he had memory issues since last April 2019.  #4 undiagnosed obstructive sleep apnea on CPAP in the hospital needs outpatient sleep study.  Patient to follow-up [facility] to continue using CPAP at home.  #5 AKI improved with IV fluids.  #6 elevated troponin with CAD secondary to demand ischemia from sepsis. Restart Plavix.  #7 hypernatremia improving with fluids. Sodium 163 on admission down to143 on the day of discharge  #8 hypokalemia repleted  #9 type 2 diabetes uncontrolled on insulin at home increase Levemir to 38.  Blood sugar better after increasing the dose of insulin.   #10 BPH  continue Flomax and oxybutynin as he was taking at home  [facility]  5/29/20200 admit 5/30- GI consult 5/31 - CCS consult 5/31 - ccm consult and move to ICU. CT scan  IMPRESSION: 1. Cholelithiasis with gallbladder wall thickening and pericholecystic fluid is concerning for ACUTE CHOLECYSTITIS. No significant gallbladder distension. 2. Common bile duct is normal. No evidence of pancreatitis.  Consults:  5/30- GI consult 5/31 - CCS consult 5/31 - ccm consult and move to ICU  Procedures:  05/11/2019 placement of percutaneous cholecystostomy drain per interventional radiology  Significant Diagnostic Tests:  May 07, 2019 right upper quadrant ultrasound IMPRESSION: 1. 9 mm stone versus tumefactive sludge within the gallbladder neck. No sonographic features to suggest acute cholecystitis. 2. No biliary dilatation. 3. Increased echogenicity within the hepatic parenchyma with question of subtle nodularity of the hepatic contour, which could reflect sequelae of cirrhosis and/or steatosis.  May 09, 2019 CT scan abdomen - IMPRESSION: 1. Cholelithiasis with gallbladder wall thickening and pericholecystic fluid is concerning for ACUTE CHOLECYSTITIS. No significant gallbladder distension. 2. Common bile duct is normal. No evidence of pancreatitis.  Micro Data:   Blood culture May 07, 2019 -growing Klebsiella pneumoniae COVID-19 and respiratory virus panel May 07, 2019-negative 6/2 gbENTEROCOCCUS FAECALI  Antimicrobials:  Vancomycin -May 30 Cefepime May 29>>off Flagyl -May 30>>6/3 Ancef 05/10/2011>>6/4 Unasyn 6/4>>    Estimated body mass index is 39.57 kg/m as calculated from the following:   Height as of this encounter: 5\' 6"  (1.676 m).   Weight as of this encounter: 111.2 kg.  Discharge Instructions  Discharge Instructions    Call MD for:  difficulty breathing, headache or visual disturbances   Complete by:  As directed    Call MD for:  persistant dizziness or light-headedness   Complete by:  As directed    Call MD for:  persistant nausea and vomiting   Complete by:  As directed    Call MD for:  temperature >100.4   Complete by:   As directed    Diet - low sodium heart healthy   Complete by:  As directed    Increase activity slowly   Complete by:  As directed      Allergies as of 05/17/2019   No Known Allergies     Medication List    STOP taking these medications   cefdinir 300 MG capsule Commonly known as:  OMNICEF   cyclobenzaprine 5 MG tablet Commonly known as:  FLEXERIL   furosemide 80 MG tablet Commonly known as:  LASIX   gabapentin 100 MG capsule Commonly known as:  NEURONTIN   glimepiride 4 MG tablet Commonly known as:  AMARYL   hydrALAZINE 25 MG tablet Commonly known as:  APRESOLINE   hydrochlorothiazide 12.5 MG capsule Commonly known as:  MICROZIDE   loperamide 2 MG capsule Commonly known as:  IMODIUM   Pancrelipase (Lip-Prot-Amyl) 40000-126000 units Cpep Commonly known as:  Zenpep   ranitidine 75 MG tablet Commonly known as:  ZANTAC     TAKE these medications   albuterol 108 (90 Base) MCG/ACT inhaler Commonly known as:  VENTOLIN HFA Inhale 1 puff into the lungs every 6 (six) hours as needed for wheezing or shortness of breath.   amoxicillin-clavulanate 875-125 MG tablet Commonly known as:  Augmentin Take 1 tablet by mouth 2 (two) times daily for 10 days.   aspirin 81 MG tablet Take 1 tablet (81 mg total) by mouth daily.   azelastine 0.1 % nasal spray Commonly known as:  ASTELIN  Place 1 spray into both nostrils as directed. Use in each nostril as directed   benazepril 10 MG tablet Commonly known as:  LOTENSIN TAKE 2 tablets by mouth TWICE daily   clopidogrel 75 MG tablet Commonly known as:  PLAVIX Take 1 tablet by mouth once daily   dicyclomine 10 MG capsule Commonly known as:  BENTYL TAKE 1 CAPSULE BY MOUTH 4 TIMES DAILY What changed:    See the new instructions.  Another medication with the same name was removed. Continue taking this medication, and follow the directions you see here.   fluocinonide ointment 0.05 % Commonly known as:  LIDEX Apply 1  application topically as needed. Apply to affected areas for infection   insulin NPH Human 100 UNIT/ML injection Commonly known as:  NOVOLIN N Inject into the skin as directed. Pt takes 40 units in the morning and 55 units at bedtime   ipratropium-albuterol 0.5-2.5 (3) MG/3ML Soln Commonly known as:  DUONEB Inhale 3 mLs into the lungs 4 (four) times daily as needed for wheezing or shortness of breath.   loratadine 10 MG tablet Commonly known as:  CLARITIN Take 10 mg by mouth daily.   metoprolol succinate 25 MG 24 hr tablet Commonly known as:  TOPROL-XL Take 3 tablets (75 mg total) by mouth daily. What changed:    how much to take  when to take this   nitroGLYCERIN 0.4 MG SL tablet Commonly known as:  Nitrostat Place 1 tablet (0.4 mg total) under the tongue every 5 (five) minutes as needed for chest pain.   NovoLIN R 100 units/mL injection Generic drug:  insulin regular Inject 10-20 Units into the skin 3 (three) times daily before meals. What changed:  Another medication with the same name was removed. Continue taking this medication, and follow the directions you see here.   oxybutynin 5 MG tablet Commonly known as:  DITROPAN Take 5 mg by mouth at bedtime. Do not with Dicyclomine   pantoprazole 40 MG tablet Commonly known as:  PROTONIX Take 1 tablet (40 mg total) by mouth daily.   potassium chloride SA 20 MEQ tablet Commonly known as:  K-DUR Take 2 tablets (40 mEq total) by mouth daily for 7 days.   pravastatin 20 MG tablet Commonly known as:  PRAVACHOL Take 1 tablet (20 mg total) by mouth at bedtime.   Symbicort 160-4.5 MCG/ACT inhaler Generic drug:  budesonide-formoterol Inhale 2 puffs into the lungs 2 (two) times daily.   tamsulosin 0.4 MG Caps capsule Commonly known as:  FLOMAX Take 1 capsule (0.4 mg total) by mouth daily.   Vitamin D 50 MCG (2000 UT) tablet Take 2,000 Units by mouth daily.      Follow-up Information    Gaynelle Adu, MD. Schedule an  appointment as soon as possible for a visit in 6 week(s).   Specialty:  General Surgery Why:  for follow up regarding gallbladder removal Contact information: [address] [place] [address] [phone]        Shelle Iron, MD Follow up.   Specialty:  Family Medicine Why:  need blood work cbc cmp 6/10 Contact information: [address] [place] [address] [phone]        Baird Lyons, PA-C Follow up.   Specialty:  Radiology Why:  Please call for appointment Contact information: [address] [place] [address] [phone]          No Known Allergies  Consultations:  SURGERY IR  Procedures/Studies: Ct Head Wo Contrast  Result Date: 05/08/2019 CLINICAL DATA:  Altered mental status.  Combative. EXAM: CT HEAD WITHOUT CONTRAST TECHNIQUE: Contiguous axial images were obtained from the base of the skull through the vertex without intravenous contrast. COMPARISON:  None. FINDINGS: Brain: Motion degradation of the exam. Exam was repeated with minimal improvement. No gross evidence intracranial hemorrhage. No midline shift or mass effect. Difficult to exclude cortical infarction with the degree of motion. Basal cisterns are patent. Vascular: No hyperdense vessel or unexpected calcification. Skull: Normal. Negative for fracture or focal lesion. Sinuses/Orbits: Paranasal sinuses and mastoid air cells are clear. Orbits are clear. Other: None. IMPRESSION: Exam is extremely degraded by head motion. Exam was repeated with little improvement. No gross abnormality. Electronically Signed   By: Genevive Bi M.D.   On: 05/08/2019 19:34   Ct Abdomen Pelvis W Contrast  Result Date: 05/09/2019 CLINICAL DATA:  Fever.  Concern for pancreatitis EXAM: CT ABDOMEN AND PELVIS WITH CONTRAST TECHNIQUE: Multidetector CT imaging of the abdomen and pelvis was performed using the standard protocol following bolus administration of intravenous contrast.  CONTRAST:  OMNIPAQUE IOHEXOL 300 MG/ML  SOLN COMPARISON:  None. FINDINGS: Lower chest: Bilateral small pleural effusions. No infiltrate lung bases. Central venous congestion. Hepatobiliary: Normal hepatic parenchyma. The gallbladder wall is mildly thickened enhancing this is minimal finding with the gallbladder wall measuring 2 mm. There is pericholecystic fluid in the gallbladder fossa (image 38/3. Several small gallstones are present. The gallbladder bladder is not distended. Common bile duct is normal caliber. Pancreas: Pancreas is normal. No ductal dilatation. No pancreatic inflammation. Spleen: Stomach, small bowel, appendix, and cecum are normal. The colon and rectosigmoid colon are normal. Adrenals/urinary tract: Adrenal glands and kidneys are normal. The ureters and bladder normal. Foley catheter in bladder. Stomach/Bowel: Stomach, small bowel, appendix, and cecum are normal. The colon and rectosigmoid colon are normal. Vascular/Lymphatic: Abdominal aorta is normal caliber with atherosclerotic calcification. There is no retroperitoneal or periportal lymphadenopathy. No pelvic lymphadenopathy. Reproductive: Prostate normal. Other: No free fluid. Musculoskeletal: No aggressive osseous lesion. IMPRESSION: 1. Cholelithiasis with gallbladder wall thickening and pericholecystic fluid is concerning for ACUTE CHOLECYSTITIS. No significant gallbladder distension. 2. Common bile duct is normal.  No evidence of pancreatitis. These results will be called to the ordering clinician or representative by the Radiologist Assistant, and communication documented in the PACS or zVision Dashboard. Electronically Signed   By: Genevive Bi M.D.   On: 05/09/2019 13:36   US Renal  Result Date: 05/11/2019 CLINICAL DATA:  Renal failure EXAM: RENAL / URINARY TRACT ULTRASOUND COMPLETE COMPARISON:  CT from 05/09/2019 FINDINGS: Right Kidney: Renal measurements: 13.3 x 6.3 x 6.6 cm. = volume: 290 mL . Echogenicity within  normal limits. No mass or hydronephrosis visualized. Left Kidney: Renal measurements: 12.1 x 6.8 x 5.9 cm = volume: 254 mL. Echogenicity within normal limits. No mass or hydronephrosis visualized. Bladder: Decompressed by Foley catheter. IMPRESSION: Normal appearing kidneys bilaterally. Electronically Signed   By: Alcide Clever M.D.   On: 05/11/2019 12:44   US Abdomen Limited  Result Date: 05/08/2019 CLINICAL DATA:  Initial evaluation for acute fever, elevated LFTs and bilirubin. EXAM: ULTRASOUND ABDOMEN LIMITED RIGHT UPPER QUADRANT COMPARISON:  Prior CT from 01/19/2015 FINDINGS: Gallbladder: 9 mm echogenic focus at the gallbladder neck could reflect a small stone versus tumefactive sludge. Gallbladder wall measured at the upper limits of normal at 3 mm. No free pericholecystic fluid. No sonographic Murphy sign elicited on exam. Common bile duct: Diameter: 3.7  mm Liver: No focal lesion identified. Diffusely increased echogenicity within the hepatic parenchyma. Question of fine nodularity of the hepatic contour. Portal vein is patent on color Doppler imaging with normal direction of blood flow towards the liver. IMPRESSION: 1. 9 mm stone versus tumefactive sludge within the gallbladder neck. No sonographic features to suggest acute cholecystitis. 2. No biliary dilatation. 3. Increased echogenicity within the hepatic parenchyma with question of subtle nodularity of the hepatic contour, which could reflect sequelae of cirrhosis and/or steatosis. Electronically Signed   By: Rise MuBenjamin  McClintock M.D.   On: 05/08/2019 00:16   Ir Perc Cholecystostomy  Result Date: 05/11/2019 INDICATION: Sepsis, cholecystitis EXAM: ULTRASOUND FLUOROSCOPIC 10 FRENCH PERCUTANEOUS CHOLECYSTOSTOMY MEDICATIONS: Patient is already receiving IV antibiotics as an inpatient ANESTHESIA/SEDATION: 100 mg Ketamine provided by the critical care medicine staff Moderate Sedation Time: None. The patient's level of consciousness and vital signs were  monitored continuously by radiology nursing throughout the procedure under my direct supervision. FLUOROSCOPY TIME:  Fluoroscopy Time: 0 minutes 42 seconds (28 mGy). COMPLICATIONS: None immediate. PROCEDURE: Informed written consent was obtained from the patient's family after a thorough discussion of the procedural risks, benefits and alternatives. All questions were addressed. Maximal Sterile Barrier Technique was utilized including caps, mask, sterile gowns, sterile gloves, sterile drape, hand hygiene and skin antiseptic. A timeout was performed prior to the initiation of the procedure. Preliminary ultrasound performed. The gallbladder was localized in the right upper quadrant. This was correlated with the CT. Overlying skin marked. Under sterile conditions and local anesthesia, ultrasound percutaneous transhepatic needle access performed with a 21 gauge needle. Needle position confirmed with ultrasound. Images obtained for documentation for ultrasound access. There was return of green bile. Sample sent for culture. Guidewire inserted followed by the Accustick dilator set. Amplatz guidewire inserted followed by tract dilatation to insert a 10 [place] drain. Drain catheter position confirmed with ultrasound and fluoroscopy. Images obtained for documentation. 30 cc bile aspirated. Catheter connected to external gravity drainage bag. Catheter secured with Prolene suture and a sterile dressing. No immediate complication. Patient tolerated the procedure well. IMPRESSION: Successful ultrasound fluoroscopic 10 French percutaneous transhepatic cholecystostomy. Bile culture sent. Electronically Signed   By: Judie PetitM.  Shick M.D.   On: 05/11/2019 15:23   Dg Chest Port 1 View  Result Date: 05/13/2019 CLINICAL DATA:  Abnormal respirations EXAM: PORTABLE CHEST 1 VIEW COMPARISON:  05/12/2019 FINDINGS: Cardiac shadow is stable but accentuated by the portable technique. Right-sided PICC line is deep within the right atrium. The  overall inspiratory effort is again poor. Mild central vascular congestion is again noted. No focal infiltrate is seen. IMPRESSION: Mild CHF. Electronically Signed   By: Alcide CleverMark  Lukens M.D.   On: 05/13/2019 07:05   Dg Chest Port 1 View  Result Date: 05/12/2019 CLINICAL DATA:  Respiratory failure EXAM: PORTABLE CHEST 1 VIEW COMPARISON:  05/09/2019 FINDINGS: Cardiac shadow remains enlarged but accentuated by the portable technique. Right-sided PICC line is noted in satisfactory position. Poor inspiratory effort with congestive failure is again identified but improved when compared with the prior exam. No new focal infiltrate is seen. No bony abnormality is noted. IMPRESSION: Tubes and lines as described. Changes of congestive failure but improved from the prior study. Electronically Signed   By: Alcide CleverMark  Lukens M.D.   On: 05/12/2019 07:25   Dg Chest Port 1 View  Result Date: 05/09/2019 CLINICAL DATA:  Altered mental status and shortness of breath today. EXAM: PORTABLE CHEST 1 VIEW COMPARISON:  Single-view of the chest earlier today. FINDINGS:  Cardiomegaly and pulmonary edema persist without notable change. No pneumothorax or pleural effusion. No acute or focal bony abnormality. IMPRESSION: No change in findings most compatible with congestive heart failure. Electronically Signed   By: Drusilla Kanner M.D.   On: 05/09/2019 16:59   Dg Chest Port 1 View  Result Date: 05/09/2019 CLINICAL DATA:  Shortness of breath. EXAM: PORTABLE CHEST 1 VIEW COMPARISON:  05/08/2019 FINDINGS: Heart size remains stable. New diffuse interstitial and airspace opacities seen throughout both lungs, suspicious for diffuse pulmonary edema. No evidence of focal consolidation or definite pleural effusion. IMPRESSION: New diffuse interstitial and airspace opacities, likely due to diffuse pulmonary edema. Electronically Signed   By: Myles Rosenthal M.D.   On: 05/09/2019 03:40   Dg Chest Port 1 View  Result Date: 05/07/2019 CLINICAL DATA:   Fever EXAM: PORTABLE CHEST 1 VIEW COMPARISON:  12/05/2018 FINDINGS: Mild cardiomegaly with pulmonary vascular congestion. No overt edema. No pneumothorax or sizable pleural effusion. No focal airspace consolidation. IMPRESSION: Mild cardiomegaly with pulmonary vascular congestion. Electronically Signed   By: Deatra Robinson M.D.   On: 05/07/2019 21:38   Dg Abd Acute 2+v W 1v Chest  Result Date: 05/08/2019 CLINICAL DATA:  Shortness of breath, abdominal distension EXAM: DG ABDOMEN ACUTE W/ 1V CHEST COMPARISON:  05/07/2019 FINDINGS: Lungs are clear.  No pleural effusion or pneumothorax. The heart is normal in size. Nonobstructive bowel gas pattern. No evidence of free air on the lateral decubitus view. Degenerative changes of the visualized thoracolumbar spine. IMPRESSION: No evidence of acute cardiopulmonary disease. No evidence of small bowel obstruction or free air. Electronically Signed   By: Charline Bills M.D.   On: 05/08/2019 11:18   Dg Abd Portable 1v  Result Date: 05/09/2019 CLINICAL DATA:  Abdominal distention. Shortness of breath. Congestive heart failure. EXAM: PORTABLE ABDOMEN - 1 VIEW COMPARISON:  None. FINDINGS: The bowel gas pattern is normal. No radio-opaque calculi or other significant radiographic abnormality are seen. IMPRESSION: Negative. Electronically Signed   By: Myles Rosenthal M.D.   On: 05/09/2019 03:40   [place] Ascites (abdomen Limited)  Result Date: 05/08/2019 CLINICAL DATA:  Abdominal distension. EXAM: LIMITED ABDOMEN ULTRASOUND FOR ASCITES TECHNIQUE: Limited ultrasound survey for ascites was performed in all four abdominal quadrants. COMPARISON:  None. FINDINGS: No ascites. IMPRESSION: No ascites. Electronically Signed   By: Gerome Sam III M.D   On: 05/08/2019 21:49   [place] Ekg Site Rite  Result Date: 05/09/2019 If Site Rite image not attached, placement could not be confirmed due to current cardiac rhythm.   (Echo, Carotid, EGD, Colonoscopy, ERCP)     Subjective:   Discharge Exam: Vitals:   05/17/19 0831 05/17/19 0900  BP:    Pulse: 89 89  Resp: 18 18  Temp:    SpO2: 97% 95%   Vitals:   05/17/19 0700 05/17/19 0809 05/17/19 0831 05/17/19 0900  BP:  (!) 159/74    Pulse: 99 90 89 89  Resp: Temp:  98.6 F (37 C)    TempSrc:  Oral    SpO2: 97% (!) 89% 97% 95%  Weight:      Height:        General: Pt is alert, awake, not in acute distress Cardiovascular: RRR, S1/S2 +, no rubs, no gallops Respiratory: CTA bilaterally, no wheezing, no rhonchi Abdominal: Soft, NT, ND, bowel sounds + cholecystotomy drain in place without any evidence of erythema is green fluid in the bag. Extremities: no edema, no cyanosis  The results of significant diagnostics from this hospitalization (including imaging, microbiology, ancillary and laboratory) are listed below for reference.     Microbiology: Recent Results (from the past 240 hour(s))  Blood Culture (routine x 2)     Status: Abnormal   Collection Time: 05/07/19  9:11 PM  Result Value Ref Range Status   Specimen Description BLOOD LEFT HAND  Final   Special Requests   Final    BOTTLES DRAWN AEROBIC AND ANAEROBIC Blood Culture adequate volume   Culture  Setup Time   Final    GRAM NEGATIVE RODS IN BOTH AEROBIC AND ANAEROBIC BOTTLES CRITICAL RESULT CALLED TO, READ BACK BY AND VERIFIED WITH: PHARMD ADREW Judie Petit [id number] FCP  Performed at [facility], [address], [place], [place] [address]    Culture KLEBSIELLA PNEUMONIAE (A)  Final   Report Status 05/10/2019 FINAL  Final   Organism ID, Bacteria KLEBSIELLA PNEUMONIAE  Final      Susceptibility   Klebsiella pneumoniae - MIC*    AMPICILLIN RESISTANT Resistant     CEFAZOLIN <=4 SENSITIVE Sensitive     CEFEPIME <=1 SENSITIVE Sensitive     CEFTAZIDIME <=1 SENSITIVE Sensitive     CEFTRIAXONE <=1 SENSITIVE Sensitive     CIPROFLOXACIN <=0.25 SENSITIVE Sensitive     GENTAMICIN <=1 SENSITIVE Sensitive      IMIPENEM <=0.25 SENSITIVE Sensitive     TRIMETH/SULFA <=20 SENSITIVE Sensitive     AMPICILLIN/SULBACTAM 4 SENSITIVE Sensitive     PIP/TAZO <=4 SENSITIVE Sensitive     Extended ESBL NEGATIVE Sensitive     * KLEBSIELLA PNEUMONIAE  Blood Culture ID Panel (Reflexed)     Status: Abnormal   Collection Time: 05/07/19  9:11 PM  Result Value Ref Range Status   Enterococcus species NOT DETECTED NOT DETECTED Final   Listeria monocytogenes NOT DETECTED NOT DETECTED Final   Staphylococcus species NOT DETECTED NOT DETECTED Final   Staphylococcus aureus (BCID) NOT DETECTED NOT DETECTED Final   Streptococcus species NOT DETECTED NOT DETECTED Final   Streptococcus agalactiae NOT DETECTED NOT DETECTED Final   Streptococcus pneumoniae NOT DETECTED NOT DETECTED Final   Streptococcus pyogenes NOT DETECTED NOT DETECTED Final   Acinetobacter baumannii NOT DETECTED NOT DETECTED Final   Enterobacteriaceae species DETECTED (A) NOT DETECTED Final    Comment: Enterobacteriaceae represent a large family of gram-negative bacteria, not a single organism. CRITICAL RESULT CALLED TO, READ BACK BY AND VERIFIED WITH: PHARMD ADREW M. [phone] FCP     Enterobacter cloacae complex NOT DETECTED NOT DETECTED Final   Escherichia coli NOT DETECTED NOT DETECTED Final   Klebsiella oxytoca NOT DETECTED NOT DETECTED Final   Klebsiella pneumoniae DETECTED (A) NOT DETECTED Final    Comment: CRITICAL RESULT CALLED TO, READ BACK BY AND VERIFIED WITH: PHARMD ADREW M. [phone] FCP     Proteus species NOT DETECTED NOT DETECTED Final   Serratia marcescens NOT DETECTED NOT DETECTED Final   Carbapenem resistance NOT DETECTED NOT DETECTED Final   Haemophilus influenzae NOT DETECTED NOT DETECTED Final   Neisseria meningitidis NOT DETECTED NOT DETECTED Final   Pseudomonas aeruginosa NOT DETECTED NOT DETECTED Final   Candida albicans NOT DETECTED NOT DETECTED Final   Candida glabrata NOT DETECTED NOT DETECTED Final   Candida krusei  NOT DETECTED NOT DETECTED Final   Candida parapsilosis NOT DETECTED NOT DETECTED Final   Candida tropicalis NOT DETECTED NOT DETECTED Final    Comment: Performed at [facility], [address]  [place], [place] [address]  SARS Coronavirus 2 (CEPHEID- Performed in [facility] hospital lab), Hosp Order     Status: None   Collection Time: 05/07/19  9:26 PM  Result Value Ref Range Status   SARS Coronavirus 2 NEGATIVE NEGATIVE Final    Comment: (NOTE) If result is NEGATIVE SARS-CoV-2 target nucleic acids are NOT DETECTED. The SARS-CoV-2 RNA is generally detectable in upper and lower  respiratory specimens during the acute phase of infection. The lowest  concentration of SARS-CoV-2 viral copies this assay can detect is 250  copies / mL. A negative result does not preclude SARS-CoV-2 infection  and should not be used as the sole basis for treatment or other  patient management decisions.  A negative result may occur with  improper specimen collection / handling, submission of specimen other  than nasopharyngeal swab, presence of viral mutation(s) within the  areas targeted by this assay, and inadequate number of viral copies  (<250 copies / mL). A negative result must be combined with clinical  observations, patient history, and epidemiological information. If result is POSITIVE SARS-CoV-2 target nucleic acids are DETECTED. The SARS-CoV-2 RNA is generally detectable in upper and lower  respiratory specimens dur ing the acute phase of infection.  Positive  results are indicative of active infection with SARS-CoV-2.  Clinical  correlation with patient history and other diagnostic information is  necessary to determine patient infection status.  Positive results do  not rule out bacterial infection or co-infection with other viruses. If result is PRESUMPTIVE POSTIVE SARS-CoV-2 nucleic acids MAY BE PRESENT.   A presumptive positive result was obtained on the submitted specimen  and  confirmed on repeat testing.  While 2019 novel coronavirus  (SARS-CoV-2) nucleic acids may be present in the submitted sample  additional confirmatory testing may be necessary for epidemiological  and / or clinical management purposes  to differentiate between  SARS-CoV-2 and other Sarbecovirus currently known to infect humans.  If clinically indicated additional testing with an alternate test  methodology [phone] is advised. The SARS-CoV-2 RNA is generally  detectable in upper and lower respiratory sp ecimens during the acute  phase of infection. The expected result is Negative. Fact Sheet for Patients:  BoilerBrush.com.cy Fact Sheet for Healthcare Providers: https://pope.com/ This test is not yet approved or cleared by the [place] FDA and has been authorized for detection and/or diagnosis of SARS-CoV-2 by FDA under an Emergency Use Authorization (EUA).  This EUA will remain in effect (meaning this test can be used) for the duration of the COVID-19 declaration under Section 564(b)(1) of the Act, 21 U.S.C. section 360bbb-3(b)(1), unless the authorization is terminated or revoked sooner. Performed at [facility], [address], [place], [place] [address]   Blood Culture (routine x 2)     Status: Abnormal   Collection Time: 05/07/19  9:44 PM  Result Value Ref Range Status   Specimen Description BLOOD BLOOD LEFT FOREARM  Final   Special Requests   Final    BOTTLES DRAWN AEROBIC AND ANAEROBIC Blood Culture adequate volume   Culture  Setup Time   Final    GRAM NEGATIVE RODS IN BOTH AEROBIC AND ANAEROBIC BOTTLES CRITICAL RESULT CALLED TO, READ BACK BY AND VERIFIED WITH: T GREEN,PHARMD AT 1738 05/08/2019 BY L BENFIELD    Culture (A)  Final    KLEBSIELLA PNEUMONIAE SUSCEPTIBILITIES PERFORMED ON PREVIOUS CULTURE WITHIN THE LAST 5 DAYS. Performed at [facility], [address], [place], [place] [address]  Report  Status 05/10/2019 FINAL  Final  Urine culture     Status: None   Collection Time: 05/07/19 11:10 PM  Result Value Ref Range Status   Specimen Description URINE, RANDOM  Final   Special Requests NONE  Final   Culture   Final    NO GROWTH Performed at [facility], [address], [place], [place] [address]    Report Status 05/09/2019 FINAL  Final  Respiratory Panel by PCR     Status: None   Collection Time: 05/08/19  4:57 PM  Result Value Ref Range Status   Adenovirus NOT DETECTED NOT DETECTED Final   Coronavirus 229E NOT DETECTED NOT DETECTED Final    Comment: (NOTE) The Coronavirus on the Respiratory Panel, DOES NOT test for the novel  Coronavirus (2019 nCoV)    Coronavirus HKU1 NOT DETECTED NOT DETECTED Final   Coronavirus NL63 NOT DETECTED NOT DETECTED Final   Coronavirus OC43 NOT DETECTED NOT DETECTED Final   Metapneumovirus NOT DETECTED NOT DETECTED Final   Rhinovirus / Enterovirus NOT DETECTED NOT DETECTED Final   Influenza A NOT DETECTED NOT DETECTED Final   Influenza B NOT DETECTED NOT DETECTED Final   Parainfluenza Virus 1 NOT DETECTED NOT DETECTED Final   Parainfluenza Virus 2 NOT DETECTED NOT DETECTED Final   Parainfluenza Virus 3 NOT DETECTED NOT DETECTED Final   Parainfluenza Virus 4 NOT DETECTED NOT DETECTED Final   Respiratory Syncytial Virus NOT DETECTED NOT DETECTED Final   Bordetella pertussis NOT DETECTED NOT DETECTED Final   Chlamydophila pneumoniae NOT DETECTED NOT DETECTED Final   Mycoplasma pneumoniae NOT DETECTED NOT DETECTED Final    Comment: Performed at [facility], [address], [place], [place] [address]  MRSA PCR Screening     Status: None   Collection Time: 05/09/19  5:37 PM  Result Value Ref Range Status   MRSA by PCR NEGATIVE NEGATIVE Final    Comment:        The GeneXpert MRSA Assay (FDA approved for NASAL specimens only), is one component of a comprehensive MRSA colonization surveillance program. It is not intended to  diagnose MRSA infection nor to guide or monitor treatment for MRSA infections. Performed at [facility], [address] [place], [place] [address]   Aerobic/Anaerobic Culture (surgical/deep wound)     Status: None   Collection Time: 05/11/19  3:18 PM  Result Value Ref Range Status   Specimen Description ABSCESS GALL BLADDER  Final   Special Requests NONE  Final   Gram Stain   Final    RARE WBC PRESENT, PREDOMINANTLY MONONUCLEAR MODERATE GRAM POSITIVE COCCI    Culture   Final    ABUNDANT ENTEROCOCCUS FAECALIS NO ANAEROBES ISOLATED Performed at [facility], [address] [place], [place] [address]    Report Status 05/16/2019 FINAL  Final   Organism ID, Bacteria ENTEROCOCCUS FAECALIS  Final      Susceptibility   Enterococcus faecalis - MIC*    AMPICILLIN <=2 SENSITIVE Sensitive     VANCOMYCIN 2 SENSITIVE Sensitive     GENTAMICIN SYNERGY SENSITIVE Sensitive     * ABUNDANT ENTEROCOCCUS FAECALIS     Labs: BNP (last 3 results) Recent Labs    05/09/19 0408  BNP 223.8*   Basic Metabolic Panel: Recent Labs  Lab 05/11/19 0347  05/12/19 0308 05/12/19 1140 05/13/19 0425 05/14/19 0445 05/15/19 0559 05/16/19 0531 05/16/19 1258 05/17/19 0551  NA 158*   < > 160* 150* 149*  143 152* 149* 144 143  K 3.5   < > 3.2* 3.3* 3.0* 3.0* 3.1* 3.0* 3.1* 3.0*  CL 123*  --  125* 118* 113* 108 112* 110 108 106  CO2 25  --  25 24 26 28 30 30 28 29   GLUCOSE 193*  --  215* 451* 403* 424* 122* 84 147* 128*  BUN 35*  --  30* 26* 21* 15 12 12 8 9   CREATININE 1.65*  --  1.36* 1.15 1.07 1.05 0.97 0.91 0.86 0.95  CALCIUM 8.2*  --  8.3* 7.9* 7.8* 7.5* 7.9* 7.9* 7.8* 8.0*  MG 2.7*  --  2.6*  --  2.2 2.1  --  2.1  --   --   PHOS  --   --  1.6* 2.3*  --  1.9*  --   --   --   --    < > = values in this interval not displayed.   Liver Function Tests: Recent Labs  Lab 05/11/19 0347 05/14/19 0445 05/16/19 0531 05/17/19 0551  AST 169* 33 43* 45*  ALT 262* 52* 41 39  ALKPHOS 85 77  85 104  BILITOT 3.3* 1.9* 1.8* 1.7*  PROT 6.2* 5.7* 5.8* 6.1*  ALBUMIN 2.4* 2.3* 2.5* 2.5*   No results for input(s): LIPASE, AMYLASE in the last 168 hours. No results for input(s): AMMONIA in the last 168 hours. CBC: Recent Labs  Lab 05/11/19 0347 05/11/19 2058 05/12/19 0308 05/13/19 0425 05/16/19 0531 05/17/19 0551  WBC 8.1  --  8.2 9.2 8.5 11.7*  NEUTROABS  --   --  6.1 7.3 5.7  --   HGB 12.5* 11.9* 11.9* 11.6* 12.1* 12.1*  HCT 40.8 35.0* 39.1 37.8* 38.5* 38.5*  MCV 94.2  --  94.7 94.5 92.1 91.0  PLT 166  --  151 147* 234 269   Cardiac Enzymes: Recent Labs  Lab 05/10/19 2145 05/11/19 0347 05/11/19 0947  TROPONINI 0.07* 0.08* 0.07*   BNP: Invalid input(s): POCBNP CBG: Recent Labs  Lab 05/16/19 0831 05/16/19 1202 05/16/19 1647 05/16/19 2118 05/17/19 0807  GLUCAP 82 141* 129* 129* 96   D-Dimer No results for input(s): DDIMER in the last 72 hours. Hgb A1c No results for input(s): HGBA1C in the last 72 hours. Lipid Profile No results for input(s): CHOL, HDL, LDLCALC, TRIG, CHOLHDL, LDLDIRECT in the last 72 hours. Thyroid function studies No results for input(s): TSH, T4TOTAL, T3FREE, THYROIDAB in the last 72 hours.  Invalid input(s): FREET3 Anemia work up No results for input(s): VITAMINB12, FOLATE, FERRITIN, TIBC, IRON, RETICCTPCT in the last 72 hours. Urinalysis    Component Value Date/Time   COLORURINE YELLOW 05/07/2019 2320   APPEARANCEUR HAZY (A) 05/07/2019 2320   LABSPEC 1.021 05/07/2019 2320   PHURINE 5.0 05/07/2019 2320   GLUCOSEU >=500 (A) 05/07/2019 2320   HGBUR NEGATIVE 05/07/2019 2320   BILIRUBINUR NEGATIVE 05/07/2019 2320   KETONESUR NEGATIVE 05/07/2019 2320   PROTEINUR NEGATIVE 05/07/2019 2320   UROBILINOGEN 0.2 09/22/2014 1433   NITRITE NEGATIVE 05/07/2019 2320   LEUKOCYTESUR NEGATIVE 05/07/2019 2320   Sepsis Labs Invalid input(s): PROCALCITONIN,  WBC,  LACTICIDVEN Microbiology Recent Results (from the past 240 hour(s))  Blood  Culture (routine x 2)     Status: Abnormal   Collection Time: 05/07/19  9:11 PM  Result Value Ref Range Status   Specimen Description BLOOD LEFT HAND  Final   Special Requests   Final    BOTTLES DRAWN AEROBIC AND ANAEROBIC Blood Culture adequate volume  Culture  Setup Time   Final    GRAM NEGATIVE RODS IN BOTH AEROBIC AND ANAEROBIC BOTTLES CRITICAL RESULT CALLED TO, READ BACK BY AND VERIFIED WITH: PHARMD ADREW Judie Petit [phone] FCP  Performed at [facility], [address], [place], [place] [address]    Culture KLEBSIELLA PNEUMONIAE (A)  Final   Report Status 05/10/2019 FINAL  Final   Organism ID, Bacteria KLEBSIELLA PNEUMONIAE  Final      Susceptibility   Klebsiella pneumoniae - MIC*    AMPICILLIN RESISTANT Resistant     CEFAZOLIN <=4 SENSITIVE Sensitive     CEFEPIME <=1 SENSITIVE Sensitive     CEFTAZIDIME <=1 SENSITIVE Sensitive     CEFTRIAXONE <=1 SENSITIVE Sensitive     CIPROFLOXACIN <=0.25 SENSITIVE Sensitive     GENTAMICIN <=1 SENSITIVE Sensitive     IMIPENEM <=0.25 SENSITIVE Sensitive     TRIMETH/SULFA <=20 SENSITIVE Sensitive     AMPICILLIN/SULBACTAM 4 SENSITIVE Sensitive     PIP/TAZO <=4 SENSITIVE Sensitive     Extended ESBL NEGATIVE Sensitive     * KLEBSIELLA PNEUMONIAE  Blood Culture ID Panel (Reflexed)     Status: Abnormal   Collection Time: 05/07/19  9:11 PM  Result Value Ref Range Status   Enterococcus species NOT DETECTED NOT DETECTED Final   Listeria monocytogenes NOT DETECTED NOT DETECTED Final   Staphylococcus species NOT DETECTED NOT DETECTED Final   Staphylococcus aureus (BCID) NOT DETECTED NOT DETECTED Final   Streptococcus species NOT DETECTED NOT DETECTED Final   Streptococcus agalactiae NOT DETECTED NOT DETECTED Final   Streptococcus pneumoniae NOT DETECTED NOT DETECTED Final   Streptococcus pyogenes NOT DETECTED NOT DETECTED Final   Acinetobacter baumannii NOT DETECTED NOT DETECTED Final   Enterobacteriaceae species DETECTED (A) NOT DETECTED  Final    Comment: Enterobacteriaceae represent a large family of gram-negative bacteria, not a single organism. CRITICAL RESULT CALLED TO, READ BACK BY AND VERIFIED WITH: PHARMD ADREW M. [phone] FCP     Enterobacter cloacae complex NOT DETECTED NOT DETECTED Final   Escherichia coli NOT DETECTED NOT DETECTED Final   Klebsiella oxytoca NOT DETECTED NOT DETECTED Final   Klebsiella pneumoniae DETECTED (A) NOT DETECTED Final    Comment: CRITICAL RESULT CALLED TO, READ BACK BY AND VERIFIED WITH: PHARMD ADREW M. [phone] FCP     Proteus species NOT DETECTED NOT DETECTED Final   Serratia marcescens NOT DETECTED NOT DETECTED Final   Carbapenem resistance NOT DETECTED NOT DETECTED Final   Haemophilus influenzae NOT DETECTED NOT DETECTED Final   Neisseria meningitidis NOT DETECTED NOT DETECTED Final   Pseudomonas aeruginosa NOT DETECTED NOT DETECTED Final   Candida albicans NOT DETECTED NOT DETECTED Final   Candida glabrata NOT DETECTED NOT DETECTED Final   Candida krusei NOT DETECTED NOT DETECTED Final   Candida parapsilosis NOT DETECTED NOT DETECTED Final   Candida tropicalis NOT DETECTED NOT DETECTED Final    Comment: Performed at [facility], [address], [place], [place] [address]  SARS Coronavirus 2 (CEPHEID- Performed in [facility] hospital lab), Hosp Order     Status: None   Collection Time: 05/07/19  9:26 PM  Result Value Ref Range Status   SARS Coronavirus 2 NEGATIVE NEGATIVE Final    Comment: (NOTE) If result is NEGATIVE SARS-CoV-2 target nucleic acids are NOT DETECTED. The SARS-CoV-2 RNA is generally detectable in upper and lower  respiratory specimens during the acute phase of infection. The lowest  concentration of SARS-CoV-2 viral copies this  assay can detect is 250  copies / mL. A negative result does not preclude SARS-CoV-2 infection  and should not be used as the sole basis for treatment or other  patient management decisions.  A negative result may occur  with  improper specimen collection / handling, submission of specimen other  than nasopharyngeal swab, presence of viral mutation(s) within the  areas targeted by this assay, and inadequate number of viral copies  (<250 copies / mL). A negative result must be combined with clinical  observations, patient history, and epidemiological information. If result is POSITIVE SARS-CoV-2 target nucleic acids are DETECTED. The SARS-CoV-2 RNA is generally detectable in upper and lower  respiratory specimens dur ing the acute phase of infection.  Positive  results are indicative of active infection with SARS-CoV-2.  Clinical  correlation with patient history and other diagnostic information is  necessary to determine patient infection status.  Positive results do  not rule out bacterial infection or co-infection with other viruses. If result is PRESUMPTIVE POSTIVE SARS-CoV-2 nucleic acids MAY BE PRESENT.   A presumptive positive result was obtained on the submitted specimen  and confirmed on repeat testing.  While 2019 novel coronavirus  (SARS-CoV-2) nucleic acids may be present in the submitted sample  additional confirmatory testing may be necessary for epidemiological  and / or clinical management purposes  to differentiate between  SARS-CoV-2 and other Sarbecovirus currently known to infect humans.  If clinically indicated additional testing with an alternate test  methodology [phone]) is advised. The SARS-CoV-2 RNA is generally  detectable in upper and lower respiratory sp ecimens during the acute  phase of infection. The expected result is Negative. Fact Sheet for Patients:  BoilerBrush.com.cy Fact Sheet for Healthcare Providers: https://pope.com/ This test is not yet approved or cleared by the [place] FDA and has been authorized for detection and/or diagnosis of SARS-CoV-2 by FDA under an Emergency Use Authorization (EUA).  This EUA  will remain in effect (meaning this test can be used) for the duration of the COVID-19 declaration under Section 564(b)(1) of the Act, 21 U.S.C. section 360bbb-3(b)(1), unless the authorization is terminated or revoked sooner. Performed at [facility], [address], [place], [place] [address]   Blood Culture (routine x 2)     Status: Abnormal   Collection Time: 05/07/19  9:44 PM  Result Value Ref Range Status   Specimen Description BLOOD BLOOD LEFT FOREARM  Final   Special Requests   Final    BOTTLES DRAWN AEROBIC AND ANAEROBIC Blood Culture adequate volume   Culture  Setup Time   Final    GRAM NEGATIVE RODS IN BOTH AEROBIC AND ANAEROBIC BOTTLES CRITICAL RESULT CALLED TO, READ BACK BY AND VERIFIED WITH: T GREEN,PHARMD AT 1738 05/08/2019 BY L BENFIELD    Culture (A)  Final    KLEBSIELLA PNEUMONIAE SUSCEPTIBILITIES PERFORMED ON PREVIOUS CULTURE WITHIN THE LAST 5 DAYS. Performed at [facility], [address], [place], [place] [address]    Report Status 05/10/2019 FINAL  Final  Urine culture     Status: None   Collection Time: 05/07/19 11:10 PM  Result Value Ref Range Status   Specimen Description URINE, RANDOM  Final   Special Requests NONE  Final   Culture   Final    NO GROWTH Performed at [facility], [address], [place], [place] [address]    Report Status 05/09/2019 FINAL  Final  Respiratory Panel by PCR     Status:  None   Collection Time: 05/08/19  4:57 PM  Result Value Ref Range Status   Adenovirus NOT DETECTED NOT DETECTED Final   Coronavirus 229E NOT DETECTED NOT DETECTED Final    Comment: (NOTE) The Coronavirus on the Respiratory Panel, DOES NOT test for the novel  Coronavirus (2019 nCoV)    Coronavirus HKU1 NOT DETECTED NOT DETECTED Final   Coronavirus NL63 NOT DETECTED NOT DETECTED Final   Coronavirus OC43 NOT DETECTED NOT DETECTED Final   Metapneumovirus NOT DETECTED NOT DETECTED Final   Rhinovirus / Enterovirus NOT DETECTED NOT  DETECTED Final   Influenza A NOT DETECTED NOT DETECTED Final   Influenza B NOT DETECTED NOT DETECTED Final   Parainfluenza Virus 1 NOT DETECTED NOT DETECTED Final   Parainfluenza Virus 2 NOT DETECTED NOT DETECTED Final   Parainfluenza Virus 3 NOT DETECTED NOT DETECTED Final   Parainfluenza Virus 4 NOT DETECTED NOT DETECTED Final   Respiratory Syncytial Virus NOT DETECTED NOT DETECTED Final   Bordetella pertussis NOT DETECTED NOT DETECTED Final   Chlamydophila pneumoniae NOT DETECTED NOT DETECTED Final   Mycoplasma pneumoniae NOT DETECTED NOT DETECTED Final    Comment: Performed at [facility], [address] [place], [place] [address]  MRSA PCR Screening     Status: None   Collection Time: 05/09/19  5:37 PM  Result Value Ref Range Status   MRSA by PCR NEGATIVE NEGATIVE Final    Comment:        The GeneXpert MRSA Assay (FDA approved for NASAL specimens only), is one component of a comprehensive MRSA colonization surveillance program. It is not intended to diagnose MRSA infection nor to guide or monitor treatment for MRSA infections. Performed at [facility], [address], [place], [place] [address]   Aerobic/Anaerobic Culture (surgical/deep wound)     Status: None   Collection Time: 05/11/19  3:18 PM  Result Value Ref Range Status   Specimen Description ABSCESS GALL BLADDER  Final   Special Requests NONE  Final   Gram Stain   Final    RARE WBC PRESENT, PREDOMINANTLY MONONUCLEAR MODERATE GRAM POSITIVE COCCI    Culture   Final    ABUNDANT ENTEROCOCCUS FAECALIS NO ANAEROBES ISOLATED Performed at [facility], [address] [place], [place] [address]    Report Status 05/16/2019 FINAL  Final   Organism ID, Bacteria ENTEROCOCCUS FAECALIS  Final      Susceptibility   Enterococcus faecalis - MIC*    AMPICILLIN <=2 SENSITIVE Sensitive     VANCOMYCIN 2 SENSITIVE Sensitive     GENTAMICIN SYNERGY SENSITIVE Sensitive     * ABUNDANT ENTEROCOCCUS FAECALIS      Time coordinating discharge: 33  minutes  SIGNED:   Alwyn Ren, MD  [facility] 05/17/2019, 10:02 AM Pager   If 7PM-7AM, please contact night-coverage [email or web address] Password [email or web address]

## 2019-05-17 NOTE — Discharge Instructions (Signed)
Flush the drain daily with 10 cc normal saline

## 2019-05-17 NOTE — Final Consult Note (Signed)
Consultant Final Sign-Off Note    Assessment/Final recommendations  Johnathan Keith is a 58 y.o. male followed by me for:   Sepsis  Toxic metabolic encephalopathy - improving  Bacteremia - Klebsiella pneumoniae Gallstone pancreatitis with probable acute cholecystitis -S/Ppercchole drain placement by IR, 06/02 - drain bilious,cultures show Enterococcus faecalis... High output -LFT'sWNLand tbili trending down - lipase 17 on 06/01 - Surgery will plan laparoscopic cholecystectomy in 6 to 8 weeks after medical clearance as outpatient, assuming risk assessment and medical clearance satisfactory. - drain per IR   Wound care (if applicable):    Diet at discharge: per primary team   Activity at discharge: per primary team   Follow-up appointment:  Will have pt schedule a f/u appt in 6 weeks to discuss lap chole   Pending results:  Unresulted Labs (From admission, onward)    Start     Ordered   05/17/19 0807  Glucose, capillary  Once,   R     05/17/19 0807   05/09/19 0500  CBC  Daily,   R    Question:  Specimen collection method  Answer:  Lab=Lab collect   05/08/19 0759           Medication recommendations: antibiotics per medicine for bacteremia      Other recommendations:    Thank you for allowing [place] to participate in the care of your patient!  Please consult [place] again if you have further needs for your patient.  Kalman Drape 05/17/2019 8:14 AM    Subjective   Pt is feeling okay this morning. He is having small BM's and copious flatus. He had some nausea last night but this has resolved and he did not have emesis. He states he has abdominal soreness from coughing.   Objective  Vital signs in last 24 hours: Temp:  [97.3 F (36.3 C)-98.6 F (37 C)] 98.6 F (37 C) (06/08 0809) Pulse Rate:  [76-99] 90 (06/08 0809) Resp:  [11-22] 13 (06/08 0809) BP: (113-172)/(63-85) 159/74 (06/08 0809) SpO2:  [87 %-99 %] 89 % (06/08 0809) Weight:  [111.2 kg] 111.2 kg  (06/08 0417)  PE: Gen:NAD, alert, cooperative  HEENT: PERRL Pulm:rate and effort normal Abd: Soft,obese, ND,+BS, mild TTP of RUQ. No peritonitis Skin: warm and dry Neuro: followingcommands   Pertinent labs and Studies: Recent Labs    05/16/19 0531 05/17/19 0551  WBC 8.5 11.7*  HGB 12.1* 12.1*  HCT 38.5* 38.5*   BMET Recent Labs    05/16/19 1258 05/17/19 0551  NA 144 143  K 3.1* 3.0*  CL 108 106  CO2 28 29  GLUCOSE 147* 128*  BUN 8 9  CREATININE 0.86 0.95  CALCIUM 7.8* 8.0*   No results for input(s): LABURIN in the last 72 hours. Results for orders placed or performed during the hospital encounter of 05/07/19  Blood Culture (routine x 2)     Status: Abnormal   Collection Time: 05/07/19  9:11 PM  Result Value Ref Range Status   Specimen Description BLOOD LEFT HAND  Final   Special Requests   Final    BOTTLES DRAWN AEROBIC AND ANAEROBIC Blood Culture adequate volume   Culture  Setup Time   Final    GRAM NEGATIVE RODS IN BOTH AEROBIC AND ANAEROBIC BOTTLES CRITICAL RESULT CALLED TO, READ BACK BY AND VERIFIED WITH: [place]. [id number] FCP  Performed at [facility], [place] [address] [place], [place] [address]    Culture KLEBSIELLA PNEUMONIAE (A)  Final  Report Status 05/10/2019 FINAL  Final   Organism ID, Bacteria KLEBSIELLA PNEUMONIAE  Final      Susceptibility   Klebsiella pneumoniae - MIC*    AMPICILLIN RESISTANT Resistant     CEFAZOLIN <=4 SENSITIVE Sensitive     CEFEPIME <=1 SENSITIVE Sensitive     CEFTAZIDIME <=1 SENSITIVE Sensitive     CEFTRIAXONE <=1 SENSITIVE Sensitive     CIPROFLOXACIN <=0.25 SENSITIVE Sensitive     GENTAMICIN <=1 SENSITIVE Sensitive     IMIPENEM <=0.25 SENSITIVE Sensitive     TRIMETH/SULFA <=20 SENSITIVE Sensitive     AMPICILLIN/SULBACTAM 4 SENSITIVE Sensitive     PIP/TAZO <=4 SENSITIVE Sensitive     Extended ESBL NEGATIVE Sensitive     * KLEBSIELLA PNEUMONIAE  Blood Culture ID Panel (Reflexed)     Status:  Abnormal   Collection Time: 05/07/19  9:11 PM  Result Value Ref Range Status   Enterococcus species NOT DETECTED NOT DETECTED Final   Listeria monocytogenes NOT DETECTED NOT DETECTED Final   Staphylococcus species NOT DETECTED NOT DETECTED Final   Staphylococcus aureus (BCID) NOT DETECTED NOT DETECTED Final   Streptococcus species NOT DETECTED NOT DETECTED Final   Streptococcus agalactiae NOT DETECTED NOT DETECTED Final   Streptococcus pneumoniae NOT DETECTED NOT DETECTED Final   Streptococcus pyogenes NOT DETECTED NOT DETECTED Final   Acinetobacter baumannii NOT DETECTED NOT DETECTED Final   Enterobacteriaceae species DETECTED (A) NOT DETECTED Final    Comment: Enterobacteriaceae represent a large family of gram-negative bacteria, not a single organism. CRITICAL RESULT CALLED TO, READ BACK BY AND VERIFIED WITH: PHARMD ADREW M. [phone] FCP     Enterobacter cloacae complex NOT DETECTED NOT DETECTED Final   Escherichia coli NOT DETECTED NOT DETECTED Final   Klebsiella oxytoca NOT DETECTED NOT DETECTED Final   Klebsiella pneumoniae DETECTED (A) NOT DETECTED Final    Comment: CRITICAL RESULT CALLED TO, READ BACK BY AND VERIFIED WITH: PHARMD ADREW M. [phone] FCP     Proteus species NOT DETECTED NOT DETECTED Final   Serratia marcescens NOT DETECTED NOT DETECTED Final   Carbapenem resistance NOT DETECTED NOT DETECTED Final   Haemophilus influenzae NOT DETECTED NOT DETECTED Final   Neisseria meningitidis NOT DETECTED NOT DETECTED Final   Pseudomonas aeruginosa NOT DETECTED NOT DETECTED Final   Candida albicans NOT DETECTED NOT DETECTED Final   Candida glabrata NOT DETECTED NOT DETECTED Final   Candida krusei NOT DETECTED NOT DETECTED Final   Candida parapsilosis NOT DETECTED NOT DETECTED Final   Candida tropicalis NOT DETECTED NOT DETECTED Final    Comment: Performed at [facility], [address], [place], [place] [address]  SARS Coronavirus 2 (CEPHEID- Performed in [facility] hospital lab), Hosp Order     Status: None   Collection Time: 05/07/19  9:26 PM  Result Value Ref Range Status   SARS Coronavirus 2 NEGATIVE NEGATIVE Final    Comment: (NOTE) If result is NEGATIVE SARS-CoV-2 target nucleic acids are NOT DETECTED. The SARS-CoV-2 RNA is generally detectable in upper and lower  respiratory specimens during the acute phase of infection. The lowest  concentration of SARS-CoV-2 viral copies this assay can detect is 250  copies / mL. A negative result does not preclude SARS-CoV-2 infection  and should not be used as the sole basis for treatment or other  patient management decisions.  A negative result may occur with  improper specimen collection / handling, submission of specimen other  than nasopharyngeal swab, presence of viral  mutation(s) within the  areas targeted by this assay, and inadequate number of viral copies  (<250 copies / mL). A negative result must be combined with clinical  observations, patient history, and epidemiological information. If result is POSITIVE SARS-CoV-2 target nucleic acids are DETECTED. The SARS-CoV-2 RNA is generally detectable in upper and lower  respiratory specimens dur ing the acute phase of infection.  Positive  results are indicative of active infection with SARS-CoV-2.  Clinical  correlation with patient history and other diagnostic information is  necessary to determine patient infection status.  Positive results do  not rule out bacterial infection or co-infection with other viruses. If result is PRESUMPTIVE POSTIVE SARS-CoV-2 nucleic acids MAY BE PRESENT.   A presumptive positive result was obtained on the submitted specimen  and confirmed on repeat testing.  While 2019 novel coronavirus  (SARS-CoV-2) nucleic acids may be present in the submitted sample  additional confirmatory testing may be necessary for epidemiological  and / or clinical management purposes  to differentiate between  SARS-CoV-2 and  other Sarbecovirus currently known to infect humans.  If clinically indicated additional testing with an alternate test  methodology [phone]([id number]) is advised. The SARS-CoV-2 RNA is generally  detectable in upper and lower respiratory sp ecimens during the acute  phase of infection. The expected result is Negative. Fact Sheet for Patients:  BoilerBrush.com.cyhttps://www.fda.gov/media/136312/download Fact Sheet for Healthcare Providers: https://pope.com/https://www.fda.gov/media/136313/download This test is not yet approved or cleared by the [place] FDA and has been authorized for detection and/or diagnosis of SARS-CoV-2 by FDA under an Emergency Use Authorization (EUA).  This EUA will remain in effect (meaning this test can be used) for the duration of the COVID-19 declaration under Section 564(b)(1) of the Act, 21 U.S.C. section 360bbb-3(b)(1), unless the authorization is terminated or revoked sooner. Performed at [facility], [address] [place], [place] [address]   Blood Culture (routine x 2)     Status: Abnormal   Collection Time: 05/07/19  9:44 PM  Result Value Ref Range Status   Specimen Description BLOOD BLOOD LEFT FOREARM  Final   Special Requests   Final    BOTTLES DRAWN AEROBIC AND ANAEROBIC Blood Culture adequate volume   Culture  Setup Time   Final    GRAM NEGATIVE RODS IN BOTH AEROBIC AND ANAEROBIC BOTTLES CRITICAL RESULT CALLED TO, READ BACK BY AND VERIFIED WITH: T GREEN,PHARMD AT 1738 05/08/2019 BY L BENFIELD    Culture (A)  Final    KLEBSIELLA PNEUMONIAE SUSCEPTIBILITIES PERFORMED ON PREVIOUS CULTURE WITHIN THE LAST 5 DAYS. Performed at [facility], [address] [place], [place] [address]    Report Status 05/10/2019 FINAL  Final  Urine culture     Status: None   Collection Time: 05/07/19 11:10 PM  Result Value Ref Range Status   Specimen Description URINE, RANDOM  Final   Special Requests NONE  Final   Culture   Final    NO GROWTH Performed at [facility],  [address] [place], [place] [address]    Report Status 05/09/2019 FINAL  Final  Respiratory Panel by PCR     Status: None   Collection Time: 05/08/19  4:57 PM  Result Value Ref Range Status   Adenovirus NOT DETECTED NOT DETECTED Final   Coronavirus 229E NOT DETECTED NOT DETECTED Final    Comment: (NOTE) The Coronavirus on the Respiratory Panel, DOES NOT test for the novel  Coronavirus (2019 nCoV)    Coronavirus HKU1 NOT  DETECTED NOT DETECTED Final   Coronavirus NL63 NOT DETECTED NOT DETECTED Final   Coronavirus OC43 NOT DETECTED NOT DETECTED Final   Metapneumovirus NOT DETECTED NOT DETECTED Final   Rhinovirus / Enterovirus NOT DETECTED NOT DETECTED Final   Influenza A NOT DETECTED NOT DETECTED Final   Influenza B NOT DETECTED NOT DETECTED Final   Parainfluenza Virus 1 NOT DETECTED NOT DETECTED Final   Parainfluenza Virus 2 NOT DETECTED NOT DETECTED Final   Parainfluenza Virus 3 NOT DETECTED NOT DETECTED Final   Parainfluenza Virus 4 NOT DETECTED NOT DETECTED Final   Respiratory Syncytial Virus NOT DETECTED NOT DETECTED Final   Bordetella pertussis NOT DETECTED NOT DETECTED Final   Chlamydophila pneumoniae NOT DETECTED NOT DETECTED Final   Mycoplasma pneumoniae NOT DETECTED NOT DETECTED Final    Comment: Performed at [facility], [address] [place], [place] [address]  MRSA PCR Screening     Status: None   Collection Time: 05/09/19  5:37 PM  Result Value Ref Range Status   MRSA by PCR NEGATIVE NEGATIVE Final    Comment:        The GeneXpert MRSA Assay (FDA approved for NASAL specimens only), is one component of a comprehensive MRSA colonization surveillance program. It is not intended to diagnose MRSA infection nor to guide or monitor treatment for MRSA infections. Performed at [facility], [address] [place], [place] [address]   Aerobic/Anaerobic Culture (surgical/deep wound)     Status: None   Collection Time: 05/11/19  3:18 PM  Result Value Ref  Range Status   Specimen Description ABSCESS GALL BLADDER  Final   Special Requests NONE  Final   Gram Stain   Final    RARE WBC PRESENT, PREDOMINANTLY MONONUCLEAR MODERATE GRAM POSITIVE COCCI    Culture   Final    ABUNDANT ENTEROCOCCUS FAECALIS NO ANAEROBES ISOLATED Performed at [facility], [address] [place], [place] [address]    Report Status 05/16/2019 FINAL  Final   Organism ID, Bacteria ENTEROCOCCUS FAECALIS  Final      Susceptibility   Enterococcus faecalis - MIC*    AMPICILLIN <=2 SENSITIVE Sensitive     VANCOMYCIN 2 SENSITIVE Sensitive     GENTAMICIN SYNERGY SENSITIVE Sensitive     * ABUNDANT ENTEROCOCCUS FAECALIS    Imaging: No results found.

## 2019-05-17 NOTE — TOC Transition Note (Signed)
Transition of Care [facility]) - CM/SW Discharge Note   Patient Details  Name: Johnathan Keith MRN: [id number] Date of Birth: 17-Aug-1961  [facility] CM/SW Contact:  Pollie Friar, RN Phone Number: 05/17/2019, 12:48 PM   Clinical Narrative:    Pt is discharging home. CM spoke with wife and 3 pm pick up time arranged. Bedside RN aware. Pt has home oxygen and family will bring tank for transport.  DME will be delivered to the room prior to wife arriving.    Final next level of care: [place] Barriers to Discharge: No Barriers Identified   Patient Goals and CMS Choice Patient states their goals for this hospitalization and ongoing recovery are:: to get better CMS Medicare.gov Compare Post Acute Care list provided to:: Patient Represenative (must comment)(wife) Choice offered to / list presented to : Spouse  Discharge Placement                       Discharge Plan and Services In-house Referral: NA Discharge Planning Services: CM Consult Post Acute Care Choice: NA          DME Arranged: 3-N-1, Wheelchair manual DME Agency: [employer] Date DME Agency Contacted: 05/17/19 Time DME Agency Contacted: 1207 Representative spoke with at DME Agency: [place]: RN, PT [place] Date [place]: 05/17/19 Time [place]: 46 Representative spoke with at [place]: [place] ([place]) Interventions     Readmission Risk Interventions Readmission Risk Prevention Plan 05/13/2019  Transportation Screening Complete  HRI or [place] Complete  Social Work Consult for [place] Planning/Counseling [place] Not Applicable  Medication Review Press photographer) Complete  Some recent data might be hidden

## 2019-05-24 ENCOUNTER — Other Ambulatory Visit: Payer: Self-pay | Admitting: General Surgery

## 2019-05-24 DIAGNOSIS — K819 Cholecystitis, unspecified: Secondary | ICD-10-CM

## 2019-06-24 DIAGNOSIS — K819 Cholecystitis, unspecified: Secondary | ICD-10-CM

## 2019-06-24 HISTORY — DX: Cholecystitis, unspecified: K81.9

## 2019-06-30 ENCOUNTER — Encounter: Payer: Self-pay | Admitting: Radiology

## 2019-06-30 ENCOUNTER — Ambulatory Visit
Admission: RE | Admit: 2019-06-30 | Discharge: 2019-06-30 | Disposition: A | Discharge status: Home / Self Care | Payer: Medicare HMO | Source: Ambulatory Visit | Attending: Student | Admitting: Student

## 2019-06-30 ENCOUNTER — Other Ambulatory Visit: Payer: Self-pay

## 2019-06-30 ENCOUNTER — Ambulatory Visit
Admission: RE | Admit: 2019-06-30 | Discharge: 2019-06-30 | Disposition: A | Discharge status: Home / Self Care | Payer: Medicare HMO | Source: Ambulatory Visit | Attending: General Surgery | Admitting: General Surgery

## 2019-06-30 DIAGNOSIS — K819 Cholecystitis, unspecified: Secondary | ICD-10-CM

## 2019-06-30 HISTORY — PX: IR RADIOLOGIST EVAL & MGMT: IMG5224

## 2019-06-30 IMAGING — RF CATHETER CHOLANGIOGRAM
4 series · 12 of 12 positions shown · non-contrast
Comparison: none

INDICATION: Cholecystitis, status post cholecystostomy

[Series 1: sequence · 4 of 50 frames shown (1 of 2)]
[frame 8/50]
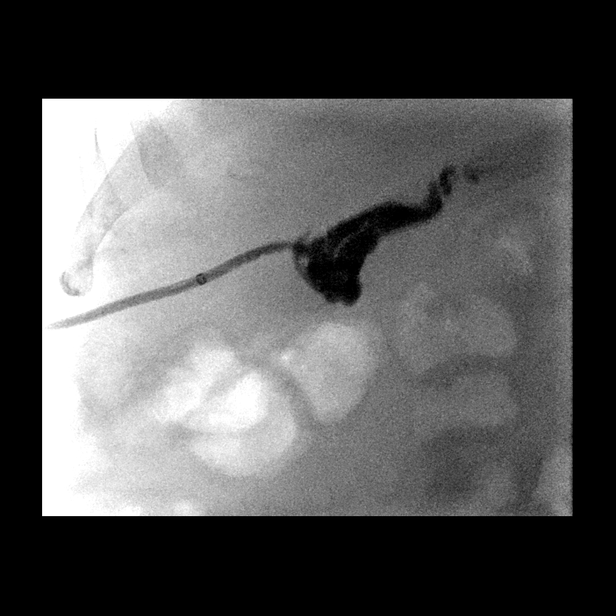
[frame 18/50]
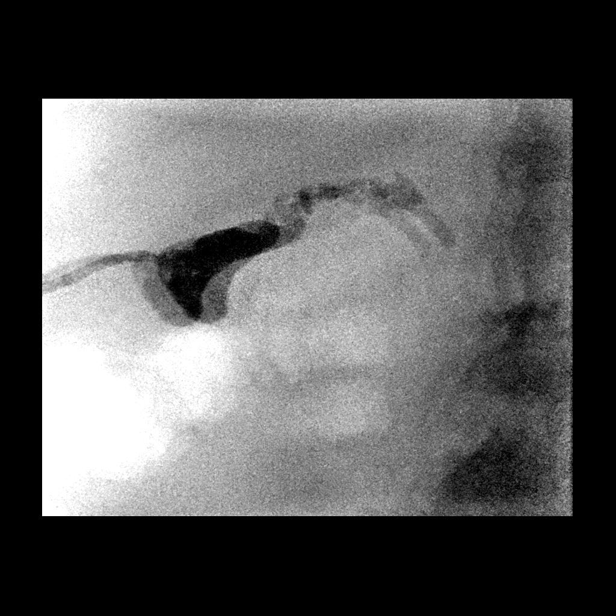
[frame 26/50]
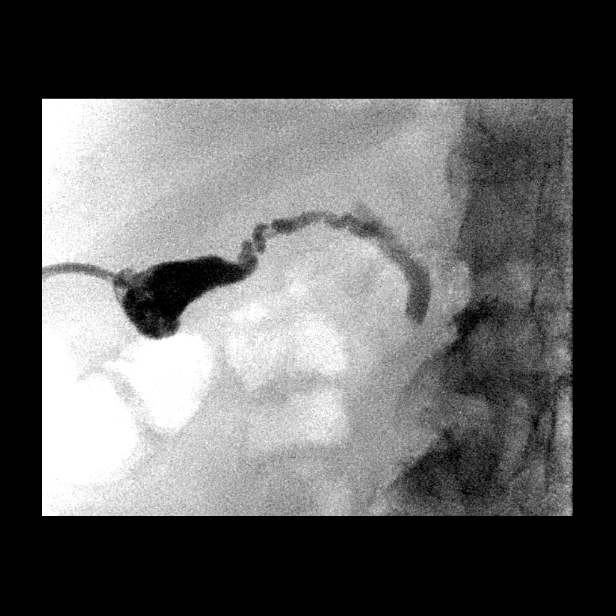
[frame 43/50]
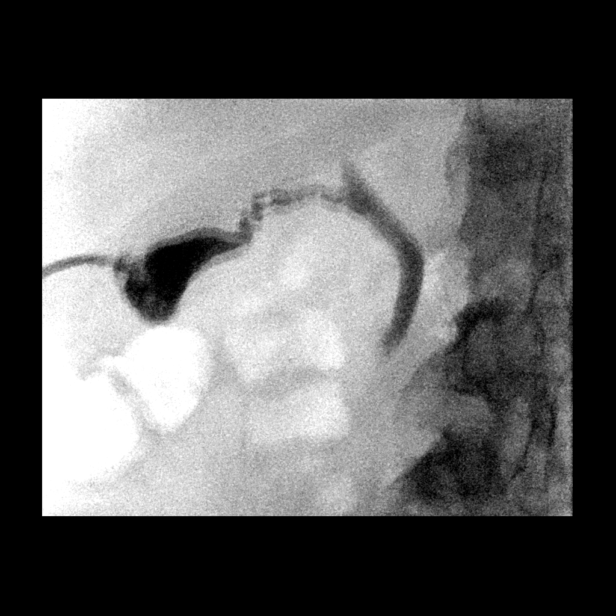

[Series 2: one shot · 0.15mm/px · 2 of 2 slices shown (1 of 2)]
[im 1/2]
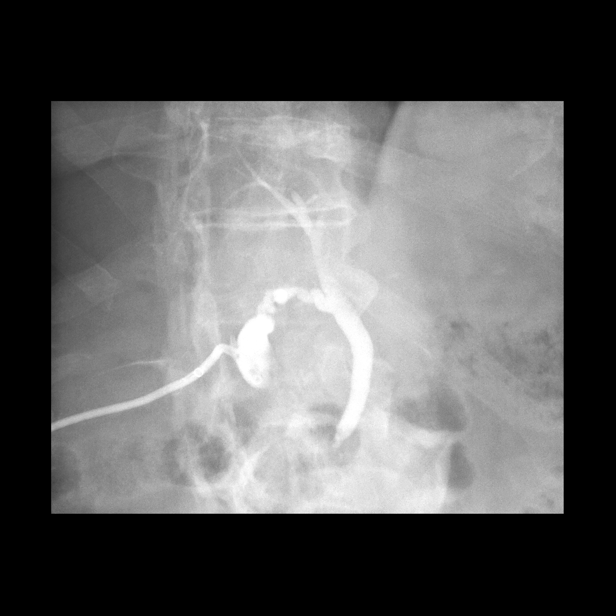
[im 2/2]
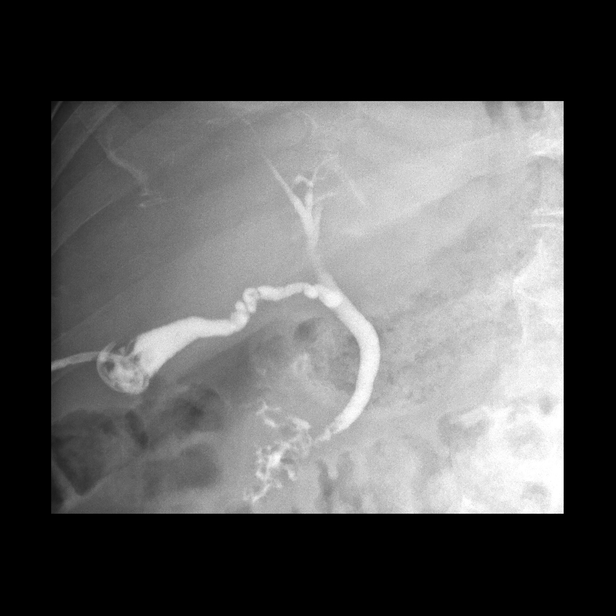

[Series 3: sequence · 4 of 51 frames shown (2 of 2)]
[frame 8/51]
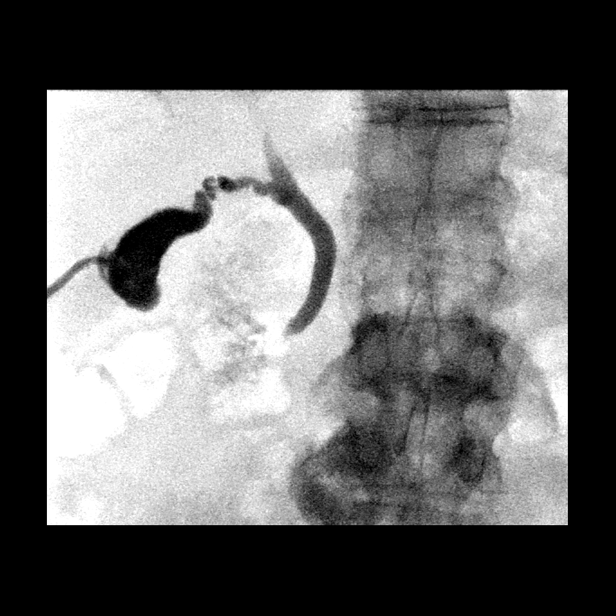
[frame 11/51]
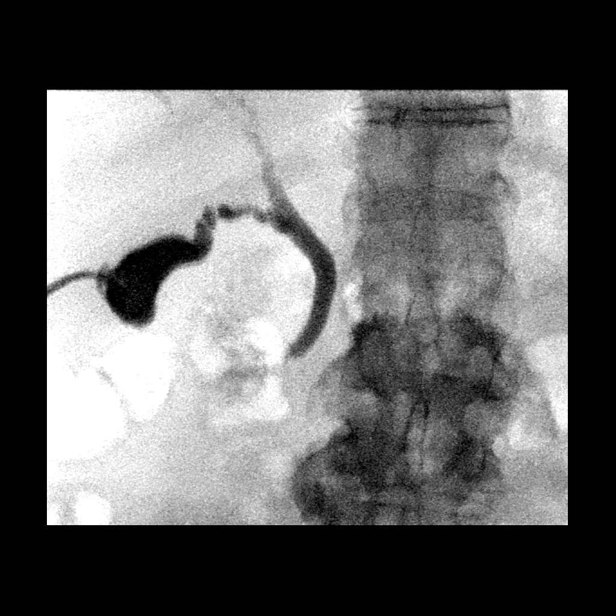
[frame 26/51]
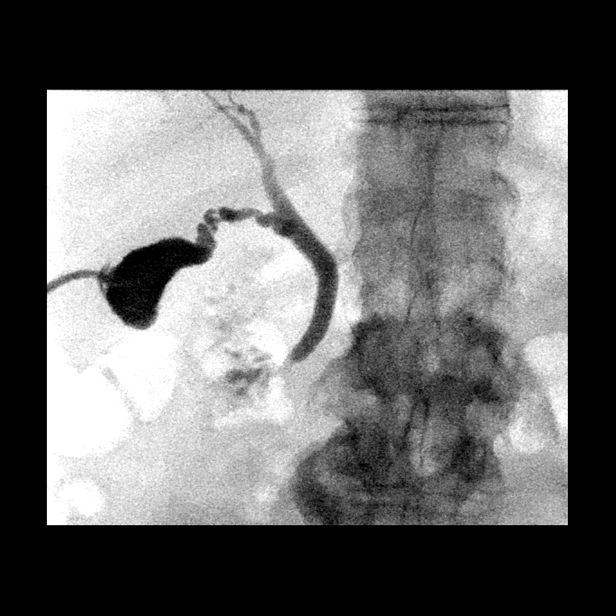
[frame 44/51]
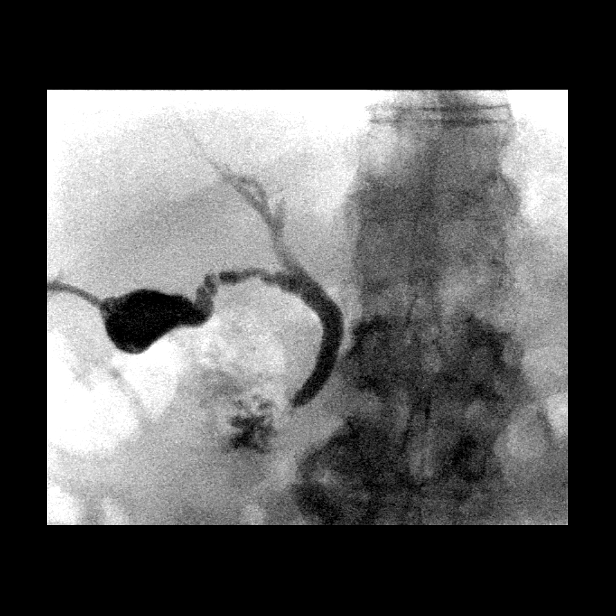

[Series 4: one shot · 0.15mm/px · 2 of 2 slices shown (2 of 2)]
[im 1/2]
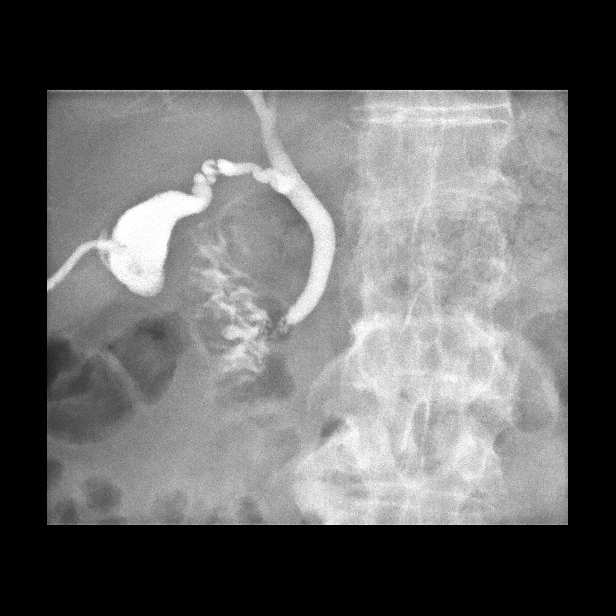
[im 2/2]
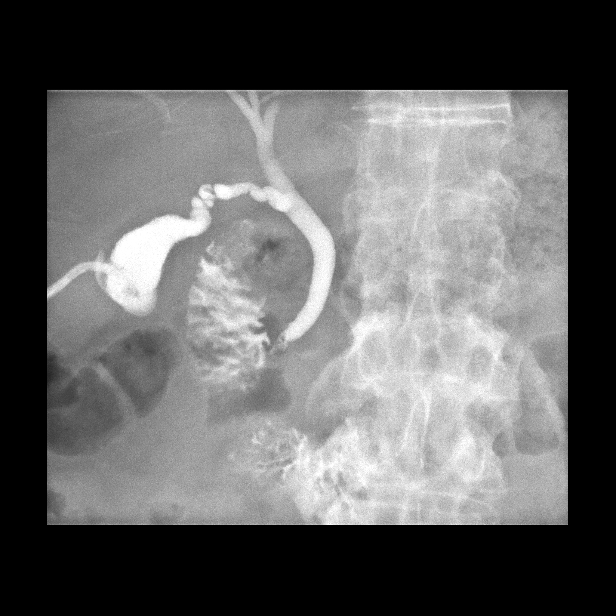

[12 of 12 positions shown; findings below may reference images not displayed]

EXAM:
Cholangiogram through existing catheter

MEDICATIONS:
None.

ANESTHESIA/SEDATION:
None.

FLUOROSCOPY TIME:  Fluoroscopy Time: 1 minutes 6 seconds (197 mGy).

COMPLICATIONS:
None immediate.

PROCEDURE:
Existing cholecystostomy was injected with contrast. Fluoroscopic
imaging performed for cholangiogram.

Cholangiogram: Cholecystostomy within the gallbladder fundus.
Filling defects within the gallbladder compatible with small
gallstones. Small nonobstructing filling defect within the cystic
duct suggest cystic duct stone. Similar small persistent non
obstructing filling defects in the distal CBD suggest distal
choledocholithiasis. No biliary stricture or obstruction. No
significant dilatation.
IMPRESSION: Cholecystostomy within the gallbladder fundus

Cholelithiasis noted

Findings suggest nonobstructing cystic duct stone and nonobstructing
distal CBD choledocholithiasis.

## 2019-06-30 NOTE — Progress Notes (Signed)
Referring Physician(s): Dr Fredonia Highland  Chief Complaint: The patient is seen in follow up today s/p cholecystostomy drain placed in IR 05/11/19 IMPRESSION: Successful ultrasound fluoroscopic 10 French percutaneous transhepatic cholecystostomy  History of present illness:  Scheduled today for follow up and drain injection OP is bile and still significant daily 100 cc in bag now; Bilious Flushes with 10 cc sterile saline daily  Has had watery diarrhea for days now Pt states he has no pain or N/V Finished antibiotic several days ago  To see Dr Andrey Campanile Thursday this week  Here today for drain injection and evaluation  Past Medical History:  Diagnosis Date  . Chronic neck and back pain   . Coronary artery disease    drug-eluting stents placed in proximal and mid LAD  . Deviated septum   . Diabetic retinopathy (HCC)   . GERD (gastroesophageal reflux disease)    "occasionally" (03/23/2015)  . Headache    "more than 2/wk" (03/23/2015)  . Heart attack (HCC)    "in 1997 was told I had signs of a small heart attack that I didn't know I'd had"  . High cholesterol   . History of gout    "when I was younger"  . Hypertension   . Kidney stones   . Migraine    "had my 1st and only in 12/2014" (03/23/2015)  . Peripheral vascular disease (HCC)    carotid artery disease  . PONV (postoperative nausea and vomiting)    "was told I was confused and combative in recovery from the narcotics w/my carotid OR"  . Sleep apnea    "severe; couldn't afford mask" (03/23/2015)  . Stroke [facility]) 2014   "on walker for 2 months; left side is weaker and numb since" (03/23/2015)  . Type II diabetes mellitus (HCC)     Past Surgical History:  Procedure Laterality Date  . CIRCUMCISION  1981  . CORONARY ANGIOPLASTY WITH STENT PLACEMENT  03/23/2015   "2"  . CORONARY STENT INTERVENTION N/A 09/29/2017   Procedure: CORONARY STENT INTERVENTION;  Surgeon: Runell Gess, MD;  Location: [facility] INVASIVE CV LAB;   Service: Cardiovascular;  Laterality: N/A;  . ENDARTERECTOMY Right 09/26/2014   Procedure: RIGHT CAROTID ENDARTERECTOMY WITH PATCH ANGIOPLASTY;  Surgeon: Fransisco Hertz, MD;  Location: [facility] OR;  Service: Vascular;  Laterality: Right;  . EYE SURGERY Right May 2016   Cataract  . EYE SURGERY Left July 2016   Cataract  . HYDROCELE EXCISION  ~ 2008  . IR PERC CHOLECYSTOSTOMY  05/11/2019  . IR RADIOLOGIST EVAL & MGMT  06/30/2019  . LEFT HEART CATH AND CORONARY ANGIOGRAPHY N/A 09/29/2017   Procedure: LEFT HEART CATH AND CORONARY ANGIOGRAPHY;  Surgeon: Runell Gess, MD;  Location: [facility] INVASIVE CV LAB;  Service: Cardiovascular;  Laterality: N/A;  . LEFT HEART CATHETERIZATION WITH CORONARY ANGIOGRAM N/A 03/23/2015   Procedure: LEFT HEART CATHETERIZATION WITH CORONARY ANGIOGRAM;  Surgeon: Runell Gess, MD;  Location: [facility] CATH LAB;  Service: Cardiovascular;  Laterality: N/A;  . REFRACTIVE SURGERY Bilateral 08/2014-09/2014   Diabetic retinopathy     Allergies: Patient has no known allergies.  Medications: Prior to Admission medications   Medication Sig Start Date End Date Taking? Authorizing Provider  albuterol (PROVENTIL HFA;VENTOLIN HFA) 108 (90 Base) MCG/ACT inhaler Inhale 1 puff into the lungs every 6 (six) hours as needed for wheezing or shortness of breath.    [provider]  aspirin 81 MG tablet Take 1 tablet (81 mg total) by mouth daily.  07/21/18   George HughVanschaick, Jessica, NP  azelastine (ASTELIN) 0.1 % nasal spray Place 1 spray into both nostrils as directed. Use in each nostril as directed    [provider]  benazepril (LOTENSIN) 10 MG tablet TAKE 2 tablets by mouth TWICE daily Patient not taking: Reported on 05/08/2019 08/18/18   Runell GessBerry, Jonathan J, MD  Cholecalciferol (VITAMIN D) 2000 UNITS tablet Take 2,000 Units by mouth daily.    [provider]  clopidogrel (PLAVIX) 75 MG tablet Take 1 tablet by mouth once daily 05/04/19   Runell GessBerry, Jonathan J, MD  dicyclomine  (BENTYL) 10 MG capsule TAKE 1 CAPSULE BY MOUTH 4 TIMES DAILY Patient taking differently: Take 10 mg by mouth 4 (four) times daily -  before meals and at bedtime. Taking once daily in the AM 12/21/18   Lynann BolognaGupta, Rajesh, MD  fluocinonide ointment (LIDEX) 0.05 % Apply 1 application topically as needed. Apply to affected areas for infection 03/23/19   [provider]  insulin NPH Human (HUMULIN N,NOVOLIN N) 100 UNIT/ML injection Inject into the skin as directed. Pt takes 40 units in the morning and 55 units at bedtime 06/23/17   [provider]  insulin regular (NOVOLIN R) 100 units/mL injection Inject 10-20 Units into the skin 3 (three) times daily before meals.    [provider]  ipratropium-albuterol (DUONEB) 0.5-2.5 (3) MG/3ML SOLN Inhale 3 mLs into the lungs 4 (four) times daily as needed for wheezing or shortness of breath. 12/23/18   [provider]  loratadine (CLARITIN) 10 MG tablet Take 10 mg by mouth daily. 04/11/19   [provider]  metoprolol succinate (TOPROL-XL) 25 MG 24 hr tablet Take 3 tablets (75 mg total) by mouth daily. Patient taking differently: Take 25 mg by mouth 2 (two) times a day.  05/15/18   Runell GessBerry, Jonathan J, MD  nitroGLYCERIN (NITROSTAT) 0.4 MG SL tablet Place 1 tablet (0.4 mg total) under the tongue every 5 (five) minutes as needed for chest pain. 08/18/18   Runell GessBerry, Jonathan J, MD  oxybutynin (DITROPAN) 5 MG tablet Take 5 mg by mouth at bedtime. Do not with Dicyclomine 04/20/19   [provider]  pantoprazole (PROTONIX) 40 MG tablet Take 1 tablet (40 mg total) by mouth daily. 03/24/18   Lynann BolognaGupta, Rajesh, MD  potassium chloride SA (K-DUR) 20 MEQ tablet Take 2 tablets (40 mEq total) by mouth daily for 7 days. 05/17/19 05/24/19  Alwyn RenMathews, Elizabeth G, MD  pravastatin (PRAVACHOL) 20 MG tablet Take 1 tablet (20 mg total) by mouth at bedtime. 08/18/18   Runell GessBerry, Jonathan J, MD  SYMBICORT 160-4.5 MCG/ACT inhaler Inhale 2 puffs into the lungs 2 (two)  times daily. 04/06/19   [provider]  tamsulosin (FLOMAX) 0.4 MG CAPS capsule Take 1 capsule (0.4 mg total) by mouth daily. 09/27/14   Dara Lordshyne, Samantha J, PA-C     Family History  Problem Relation Age of Onset  . Heart disease Mother   . Diabetes Mother   . Hyperlipidemia Mother   . Hypertension Mother   . Cancer Father        Lung Cancer   . Heart disease Father   . Heart attack Father   . Gout Brother   . Asthma Brother   . Hyperlipidemia Brother   . Hypertension Brother   . Diabetes Brother   . Colon cancer Brother        colon  . Stroke Paternal Grandfather   . Colon cancer Sister   . Hyperlipidemia Sister   .  Hypertension Sister   . Gout Maternal Uncle   . Diabetes Maternal Grandfather   . Diabetes Daughter     Social History   Socioeconomic History  . Marital status: Married    Spouse name: Not on file  . Number of children: 5  . Years of education: 12th  . Highest education level: Not on file  Occupational History  . Occupation: Disabled  Social Needs  . Financial resource strain: Not on file  . Food insecurity    Worry: Not on file    Inability: Not on file  . Transportation needs    Medical: Not on file    Non-medical: Not on file  Tobacco Use  . Smoking status: Never Smoker  . Smokeless tobacco: Never Used  Substance and Sexual Activity  . Alcohol use: No    Alcohol/week: 0.0 standard drinks  . Drug use: No  . Sexual activity: Not Currently    Partners: Female  Lifestyle  . Physical activity    Days per week: Not on file    Minutes per session: Not on file  . Stress: Not on file  Relationships  . Social Musicianconnections    Talks on phone: Not on file    Gets together: Not on file    Attends religious service: Not on file    Active member of club or organization: Not on file    Attends meetings of clubs or organizations: Not on file    Relationship status: Not on file  Other Topics Concern  . Not on file  Social History Narrative    Patient is married with 5 children.    Patient is right handed.   Patient has hs education.   Patient drinks 4 12 oz can sodas daily.     Vital Signs: BP (!) 163/86   Pulse 87   Temp (!) 97.4 F (36.3 C)   SpO2 99%   Physical Exam Skin:    General: Skin is warm and dry.     Comments: Site is clean and dry NT no bleeding No hematoma No sign of infection  OP bilious in bag~ 100 cc today  Drain inject shows flow to duodenum, but evidence of probable small stone per Dr Miles CostainShick   Neurological:     Mental Status: He is alert.     Imaging: Ir Radiologist Eval & Mgmt  Result Date: 06/30/2019 Please refer to notes tab for details about interventional procedure. (Op Note)   Labs:  CBC: Recent Labs    05/12/19 0308 05/13/19 0425 05/16/19 0531 05/17/19 0551  WBC 8.2 9.2 8.5 11.7*  HGB 11.9* 11.6* 12.1* 12.1*  HCT 39.1 37.8* 38.5* 38.5*  PLT 151 147* 234 269    COAGS: Recent Labs    05/10/19 0441  INR 1.3*    BMP: Recent Labs    05/15/19 0559 05/16/19 0531 05/16/19 1258 05/17/19 0551  NA 152* 149* 144 143  K 3.1* 3.0* 3.1* 3.0*  CL 112* 110 108 106  CO2 30 30 28 29   GLUCOSE 122* 84 147* 128*  BUN 12 12 8 9   CALCIUM 7.9* 7.9* 7.8* 8.0*  CREATININE 0.97 0.91 0.86 0.95  GFRNONAA >60 >60 >60 >60  GFRAA >60 >60 >60 >60    LIVER FUNCTION TESTS: Recent Labs    05/11/19 0347 05/14/19 0445 05/16/19 0531 05/17/19 0551  BILITOT 3.3* 1.9* 1.8* 1.7*  AST 169* 33 43* 45*  ALT 262* 52* 41 39  ALKPHOS 85 77 85 104  PROT 6.2* 5.7* 5.8* 6.1*  ALBUMIN 2.4* 2.3* 2.5* 2.5*    Assessment:  Percutaneous cholecystostomy drain placed in IR 05/11/19 Draining well; draining bile Injections shows probable stone(s)  Would leave drain for now per Dr Annamaria Boots Pt to see Dr Redmond Pulling Thursday 7/24 If no surgery deemed appropriate per Dr Redmond Pulling-- will need drain tube exchange in 1 mo Pt has good understanding of plan  Signed: Lavonia Drafts, PA-C 06/30/2019, 1:48 PM    Please refer to Dr. Annamaria Boots attestation of this note for management and plan.

## 2019-07-05 ENCOUNTER — Other Ambulatory Visit (HOSPITAL_COMMUNITY): Payer: Self-pay | Admitting: General Surgery

## 2019-07-05 DIAGNOSIS — K8010 Calculus of gallbladder with chronic cholecystitis without obstruction: Secondary | ICD-10-CM

## 2019-07-08 ENCOUNTER — Telehealth (HOSPITAL_COMMUNITY): Payer: Self-pay

## 2019-07-08 ENCOUNTER — Other Ambulatory Visit: Payer: Self-pay | Admitting: General Surgery

## 2019-07-08 NOTE — Telephone Encounter (Signed)
Called to inform pt that drain exchange needs to be rescheduled in 3 weeks, no answer, left vm. AW

## 2019-07-09 ENCOUNTER — Ambulatory Visit (HOSPITAL_COMMUNITY): Admission: RE | Admit: 2019-07-09 | Payer: Medicare HMO | Source: Ambulatory Visit

## 2019-07-19 ENCOUNTER — Ambulatory Visit (INDEPENDENT_AMBULATORY_CARE_PROVIDER_SITE_OTHER): Payer: Medicare HMO | Admitting: Gastroenterology

## 2019-07-19 ENCOUNTER — Encounter: Payer: Self-pay | Admitting: Gastroenterology

## 2019-07-19 ENCOUNTER — Telehealth: Payer: Self-pay

## 2019-07-19 ENCOUNTER — Other Ambulatory Visit: Payer: Self-pay

## 2019-07-19 ENCOUNTER — Telehealth: Payer: Self-pay | Admitting: Gastroenterology

## 2019-07-19 VITALS — BP 132/74 | HR 90 | Temp 98.1°F | Ht 66.0 in | Wt 235.4 lb

## 2019-07-19 DIAGNOSIS — K529 Noninfective gastroenteritis and colitis, unspecified: Secondary | ICD-10-CM

## 2019-07-19 DIAGNOSIS — Z8619 Personal history of other infectious and parasitic diseases: Secondary | ICD-10-CM | POA: Diagnosis not present

## 2019-07-19 DIAGNOSIS — K219 Gastro-esophageal reflux disease without esophagitis: Secondary | ICD-10-CM

## 2019-07-19 DIAGNOSIS — Z800 Family history of malignant neoplasm of digestive organs: Secondary | ICD-10-CM

## 2019-07-19 DIAGNOSIS — R1310 Dysphagia, unspecified: Secondary | ICD-10-CM

## 2019-07-19 MED ORDER — DIPHENOXYLATE-ATROPINE 2.5-0.025 MG PO TABS: 1.0000 | ORAL_TABLET | Freq: Two times a day (BID) | ORAL | 6 refills | Status: DC

## 2019-07-19 NOTE — Progress Notes (Addendum)
   IMPRESSION and PLAN:    #1. Choledocholithiasis (on cholangiogram 06/30/2019) in pt with cholecystostomy tube (June 2,2020) inserted after cholecystitis/pancreatitis/SIRS.  Being followed by surgery (Dr Eric Wilson). #2. GERD with dysphagia. #3. Recent C. Diff colitis (off vancomycin since yesterday 07/18/2019) #4. H/O Chronic diarrhea - likely due to IBS with pred diarrhea, diabetic diarrhea, meds, may have mild EPI (had low fecal elastase in the past).  Neg colon 2015 and 2018 (except for small tubular adenoma), neg TI bx for crohn's and neg random colon Bx for microscopic colitis, neg CT scan A/P for any chronic pancreatitis. #5. Co-morbid conditions include CAD s/p PTCA, DM2, HTN, OSA, CVA 2016 #6. FH of colon cancer.      Plan: - Ba swallow with Ba tab ASAP. - Cardiology clearance (had cath by Dr Khawaja 05/04/2019) prior to EGD/ERCP/lap chole. - EGD with possible dil (depending upon barium results) followed by ERCP with ES/stone extraction (at same time) after holding plavix x 5 days before. Once GI WU is complete, ref back to [facility] for lap chole.  I have discussed the risks and benefits of EGD/ERCP. - Continue Lomotil 2/day (60), 6 ref. - Continue protonix 40mg po qd. - He does not want to go back on pancreatic enzymes d/t cost.  I had samples of Creon (lip 36K), use 1 with each meal until we can arrange for more samples. - Check CBC, CMP and lipase. - Watch for recurrent C. difficile. - FU after the above.   Addendum-I have reviewed the cholangiogram films 06/30/2019 showing small filling defect in the CBD and cystic duct.  CBD not much dilated. Good drainage.  I am told I have no openings at [facility] until sept . Will ask Dr Mansauraty for assistance. HPI:    Chief Complaint:   58-year-old very pleasant white male adm at [facility] 5/29-05/17/2019 for acute cholecystitis/pancreatitis with SIRS d/t Klebsiella sepsis/ARF req cholecystostomy (CT did not show any evidence of pancreatitis, however  adm lipase was 1276).  Has done very well since discharge.  Unfortunately had increased diarrhea and was tested positive for C. Difficile.  Treated with vancomycin 125 mg QID for 10 days (last dose 07/18/2019).  Cholecystostomy cholangiogram 06/30/2019-showed small cystic duct stone, small CBD stone without any significant ductal dilatation. It does show cholelithiasis as well.  GI being consulted for preoperative ERCP.  From the GI standpoint, he also complains of dysphagia x several months, getting worse.  Mostly with solids.  Mid chest.  Did have some heartburn which is better with Protonix.  No aspiration.  He does have history of stroke in the past with residual left-sided weakness.  Has chronic longstanding diarrhea.  Baseline of 3-4 bowel movements per day.  Somewhat better with Lomotil and Bentyl.  He has stopped taking Creon due to cost.    No jaundice dark urine or pale stools.  He denies having any fever or chills. No abdominal pain.  Past Medical History:  Diagnosis Date  . Chronic neck and back pain   . Coronary artery disease    drug-eluting stents placed in proximal and mid LAD  . Deviated septum   . Diabetic retinopathy (HCC)   . GERD (gastroesophageal reflux disease)    "occasionally" (03/23/2015)  . Headache    "more than 2/wk" (03/23/2015)  . Heart attack (HCC)    "in 1997 was told I had signs of a small heart attack that I didn't know I'd had"  . High cholesterol   . History   of gout    "when I was younger"  . Hypertension   . Kidney stones   . Migraine    "had my 1st and only in 12/2014" (03/23/2015)  . Peripheral vascular disease ([place])    carotid artery disease  . PONV (postoperative nausea and vomiting)    "was told I was confused and combative in recovery from the narcotics w/my carotid OR"  . Sleep apnea    "severe; couldn't afford mask" (03/23/2015)  . Stroke [facility]) 2014   "on walker for 2 months; left side is weaker and numb since" (03/23/2015)  . Type II  diabetes mellitus ([place])     Current Outpatient Medications  Medication Sig Dispense Refill  . albuterol (PROVENTIL HFA;VENTOLIN HFA) 108 (90 Base) MCG/ACT inhaler Inhale 1 puff into the lungs every 6 (six) hours as needed for wheezing or shortness of breath.    Marland Kitchen aspirin 81 MG tablet Take 1 tablet (81 mg total) by mouth daily. 1 tablet 0  . azelastine (ASTELIN) 0.1 % nasal spray Place 1 spray into both nostrils as directed. Use in each nostril as directed    . benazepril (LOTENSIN) 10 MG tablet TAKE 2 tablets by mouth TWICE daily (Patient taking differently: Take 10 mg by mouth 2 (two) times daily. TAKE 2 tablets by mouth TWICE daily) 180 tablet 3  . Cholecalciferol (VITAMIN D) 2000 UNITS tablet Take 2,000 Units by mouth daily.    . clopidogrel (PLAVIX) 75 MG tablet Take 1 tablet by mouth once daily 90 tablet 1  . dicyclomine (BENTYL) 10 MG capsule TAKE 1 CAPSULE BY MOUTH 4 TIMES DAILY (Patient taking differently: Take 10 mg by mouth 4 (four) times daily -  before meals and at bedtime. Taking once daily in the AM) 120 capsule 0  . fluocinonide ointment (LIDEX) 4.69 % Apply 1 application topically as needed. Apply to affected areas for infection    . insulin NPH Human (HUMULIN N,NOVOLIN N) 100 UNIT/ML injection Inject into the skin as directed. Pt takes 40 units in the morning and 55 units at bedtime    . insulin regular (NOVOLIN R) 100 units/mL injection Inject 10-20 Units into the skin 3 (three) times daily before meals.    Marland Kitchen ipratropium-albuterol (DUONEB) 0.5-2.5 (3) MG/3ML SOLN Inhale 3 mLs into the lungs 4 (four) times daily as needed for wheezing or shortness of breath.    . loratadine (CLARITIN) 10 MG tablet Take 10 mg by mouth daily.    . metoprolol succinate (TOPROL-XL) 25 MG 24 hr tablet Take 3 tablets (75 mg total) by mouth daily. (Patient taking differently: Take 25 mg by mouth 2 (two) times a day. ) 90 tablet 6  . nitroGLYCERIN (NITROSTAT) 0.4 MG SL tablet Place 1 tablet (0.4 mg total)  under the tongue every 5 (five) minutes as needed for chest pain. 25 tablet 3  . oxybutynin (DITROPAN) 5 MG tablet Take 5 mg by mouth at bedtime. Do not with Dicyclomine    . pantoprazole (PROTONIX) 40 MG tablet Take 1 tablet (40 mg total) by mouth daily. 30 tablet 6  . pravastatin (PRAVACHOL) 20 MG tablet Take 1 tablet (20 mg total) by mouth at bedtime. 90 tablet 3  . SYMBICORT 160-4.5 MCG/ACT inhaler Inhale 2 puffs into the lungs 2 (two) times daily.    . tamsulosin (FLOMAX) 0.4 MG CAPS capsule Take 1 capsule (0.4 mg total) by mouth daily. 30 capsule 1  . torsemide (DEMADEX) 20 MG tablet Take 20 mg by mouth daily.    Marland Kitchen  potassium chloride SA (K-DUR) 20 MEQ tablet Take 2 tablets (40 mEq total) by mouth daily for 7 days. 14 tablet 0   No current facility-administered medications for this visit.     Patient's surgical history, family medical history, social history and allergies were all reviewed in Epic    Physical Exam:     BP 132/74   Pulse 90   Temp 98.1 F (36.7 C)   Ht 5' 6" (1.676 m)   Wt 235 lb 6 oz (106.8 kg)   BMI 37.99 kg/m   GENERAL:  Alert, oriented, cooperative, not in acute distress. PSYCH: :Pleasant, normal mood and affect. HEENT:  conjunctiva pink, mucous membranes moist, neck supple without masses. No jaundice. CARDIAC:  S1 S2 normal. No murmers. PULM: Normal respiratory effort, lungs CTA bilaterally, no wheezing. ABDOMEN: Inspection: No visible peristalsis, no abnormal pulsations, skin normal.  Palpation/percussion: Soft, nontender, nondistended, no rigidity, no abnormal dullness to percussion, no hepatosplenomegaly and no palpable abdominal masses.  Cholecystostomy tube draining out bile.  No pus. auscultation: Normal bowel sounds, no abdominal bruits. Rectal exam: Deferred SKIN:  turgor, no lesions seen. Musculoskeletal:  Normal muscle tone, normal strength. NEURO: Alert and oriented x 3, some left-sided weakness Hepatic Function Latest Ref Rng & Units 05/17/2019  05/16/2019 05/14/2019  Total Protein 6.5 - 8.1 g/dL 6.1(L) 5.8(L) 5.7(L)  Albumin 3.5 - 5.0 g/dL 2.5(L) 2.5(L) 2.3(L)  AST 15 - 41 U/L 45(H) 43(H) 33  ALT 0 - 44 U/L 39 41 52(H)  Alk Phosphatase 38 - 126 U/L 104 85 77  Total Bilirubin 0.3 - 1.2 mg/dL 1.7(H) 1.8(H) 1.9(H)  Bilirubin, Direct 0.00 - 0.40 mg/dL - - -   CBC Latest Ref Rng & Units 05/17/2019 05/16/2019 05/13/2019  WBC 4.0 - 10.5 K/uL 11.7(H) 8.5 9.2  Hemoglobin 13.0 - 17.0 g/dL 12.1(L) 12.1(L) 11.6(L)  Hematocrit 39.0 - 52.0 % 38.5(L) 38.5(L) 37.8(L)  Platelets 150 - 400 K/uL 269 234 147(L)   Lipase     Component Value Date/Time   LIPASE 17 05/10/2019 0441   Cholangiogram films were reviewed independently Extensive notes were reviewed  40 minutes spent with the patient today. Greater than 50% was spent in counseling and coordination of care with the patient     ,MD 07/19/2019, 3:26 PM   

## 2019-07-19 NOTE — Patient Instructions (Signed)
If you are age 60 or older, your body mass index should be between 23-30. Your Body mass index is 37.99 kg/m. If this is out of the aforementioned range listed, please consider follow up with your Primary Care Provider.  If you are age 63 or younger, your body mass index should be between 19-25. Your Body mass index is 37.99 kg/m. If this is out of the aformentioned range listed, please consider follow up with your Primary Care Provider.   You have been scheduled for a Barium Esophogram at [facility] (1st floor of the hospital) on 07/22/19 at 10:30am. Please arrive 15 minutes prior to your appointment for registration. Make certain not to have anything to eat or drink 3 hours prior to your test. If you need to reschedule for any reason, please contact radiology at [phone] to do so. __________________________________________________________________ A barium swallow is an examination that concentrates on views of the esophagus. This tends to be a double contrast exam (barium and two liquids which, when combined, create a gas to distend the wall of the oesophagus) or single contrast (non-ionic iodine based). The study is usually tailored to your symptoms so a good history is essential. Attention is paid during the study to the form, structure and configuration of the esophagus, looking for functional disorders (such as aspiration, dysphagia, achalasia, motility and reflux) EXAMINATION You may be asked to change into a gown, depending on the type of swallow being performed. A radiologist and radiographer will perform the procedure. The radiologist will advise you of the type of contrast selected for your procedure and direct you during the exam. You will be asked to stand, sit or lie in several different positions and to hold a small amount of fluid in your mouth before being asked to swallow while the imaging is performed .In some instances you may be asked to swallow barium coated  marshmallows to assess the motility of a solid food bolus. The exam can be recorded as a digital or video fluoroscopy procedure. POST PROCEDURE It will take 1-2 days for the barium to pass through your system. To facilitate this, it is important, unless otherwise directed, to increase your fluids for the next 24-48hrs and to resume your normal diet.  This test typically takes about 30 minutes to perform. __________________________________________________________________________________  Dennis Bast will be contacted by our office prior to your procedure for directions on holding your Plavix.  If you do not hear from our office 1 week prior to your scheduled procedure, please call [phone] to discuss.   You will be contacted with a date and time for your EGD and ERCP.   Continue Protonix and Lomotil.   Stop Ranitidine.   Thank you,  Dr. Jackquline Denmark

## 2019-07-19 NOTE — Telephone Encounter (Signed)
Pt is scheduled for appt today and informed that he had just finished a 10-day treatment of C. difficile.  FYI.

## 2019-07-19 NOTE — Telephone Encounter (Signed)
informational

## 2019-07-19 NOTE — Telephone Encounter (Signed)
Covid-19 screening questions   Do you now or have you had a fever in the last 14 days? No  Do you have any respiratory symptoms of shortness of breath or cough now or in the last 14 days? No  Do you have any family members or close contacts with diagnosed or suspected Covid-19 in the past 14 days? No  Have you been tested for Covid-19 and found to be positive? Was found to be negative       

## 2019-07-20 ENCOUNTER — Other Ambulatory Visit: Payer: Self-pay

## 2019-07-20 ENCOUNTER — Other Ambulatory Visit: Payer: Self-pay | Admitting: Physician Assistant

## 2019-07-20 ENCOUNTER — Telehealth: Payer: Self-pay

## 2019-07-20 DIAGNOSIS — R1310 Dysphagia, unspecified: Secondary | ICD-10-CM

## 2019-07-20 DIAGNOSIS — Z8619 Personal history of other infectious and parasitic diseases: Secondary | ICD-10-CM

## 2019-07-20 DIAGNOSIS — K529 Noninfective gastroenteritis and colitis, unspecified: Secondary | ICD-10-CM

## 2019-07-20 DIAGNOSIS — K219 Gastro-esophageal reflux disease without esophagitis: Secondary | ICD-10-CM

## 2019-07-20 NOTE — Telephone Encounter (Signed)
Called and spoke to wife and was told that we did receive the cardiac clearance for the Plavix to be held 5 days prior to his procedure that hasn't been scheduled yet so we will be giving them a call back in regards to setting up appointment. Also blood work needed to be done this week and she said hopefully Thursday they will get the blood drawn

## 2019-07-21 ENCOUNTER — Other Ambulatory Visit: Payer: Self-pay

## 2019-07-21 ENCOUNTER — Telehealth: Payer: Self-pay

## 2019-07-21 ENCOUNTER — Encounter (HOSPITAL_COMMUNITY): Payer: Self-pay | Admitting: Diagnostic Radiology

## 2019-07-21 ENCOUNTER — Ambulatory Visit (HOSPITAL_COMMUNITY)
Admission: RE | Admit: 2019-07-21 | Discharge: 2019-07-21 | Disposition: A | Discharge status: Home / Self Care | Payer: Medicare HMO | Source: Ambulatory Visit | Attending: General Surgery | Admitting: General Surgery

## 2019-07-21 DIAGNOSIS — Z4803 Encounter for change or removal of drains: Secondary | ICD-10-CM | POA: Insufficient documentation

## 2019-07-21 DIAGNOSIS — K8010 Calculus of gallbladder with chronic cholecystitis without obstruction: Secondary | ICD-10-CM | POA: Diagnosis not present

## 2019-07-21 HISTORY — PX: IR EXCHANGE BILIARY DRAIN: IMG6046

## 2019-07-21 IMAGING — XA IR CATHETER TUBE CHANGE
3 series · 3 of 3 positions shown · non-contrast
Comparison: none

INDICATION: 58-year-old with history of cholecystitis and percutaneous
cholecystostomy. Patient presents for routine drain exchange prior
to cholecystectomy.

[Series 2: fl (-) angio · 1 of 1 slices shown (1 of 3)]
[im 1/1]
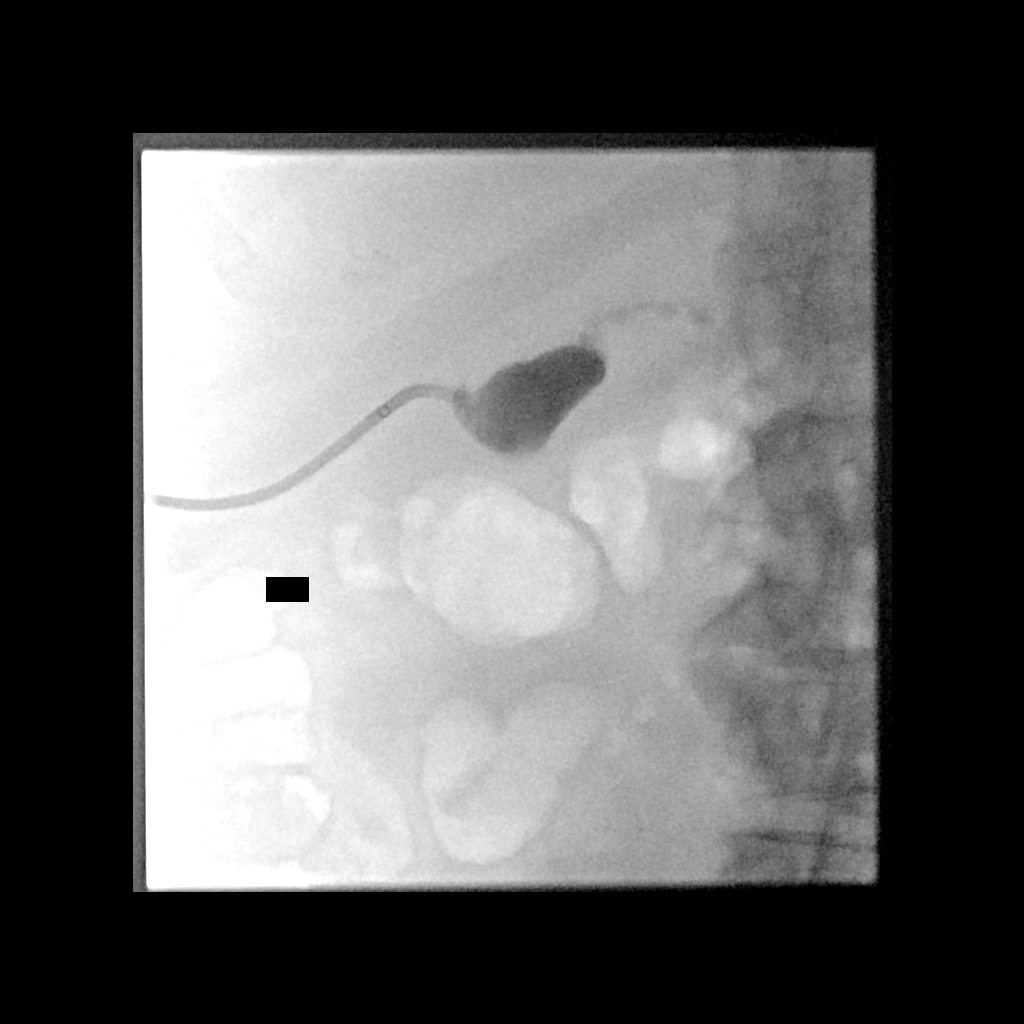

[Series 3: fl (-) angio · 1 of 1 slices shown (2 of 3)]
[im 1/1]
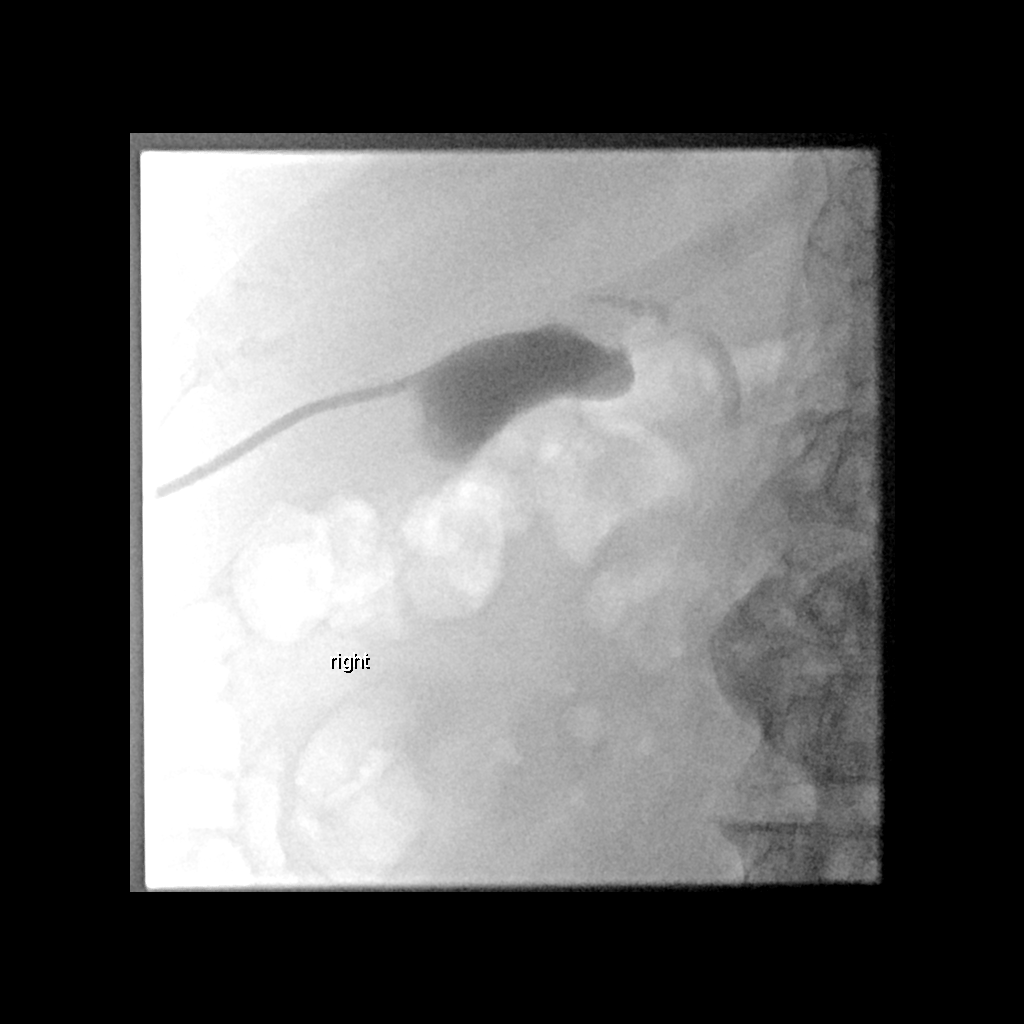

[Series 4: fl (-) angio · 1 of 1 slices shown (3 of 3)]
[im 1/1]
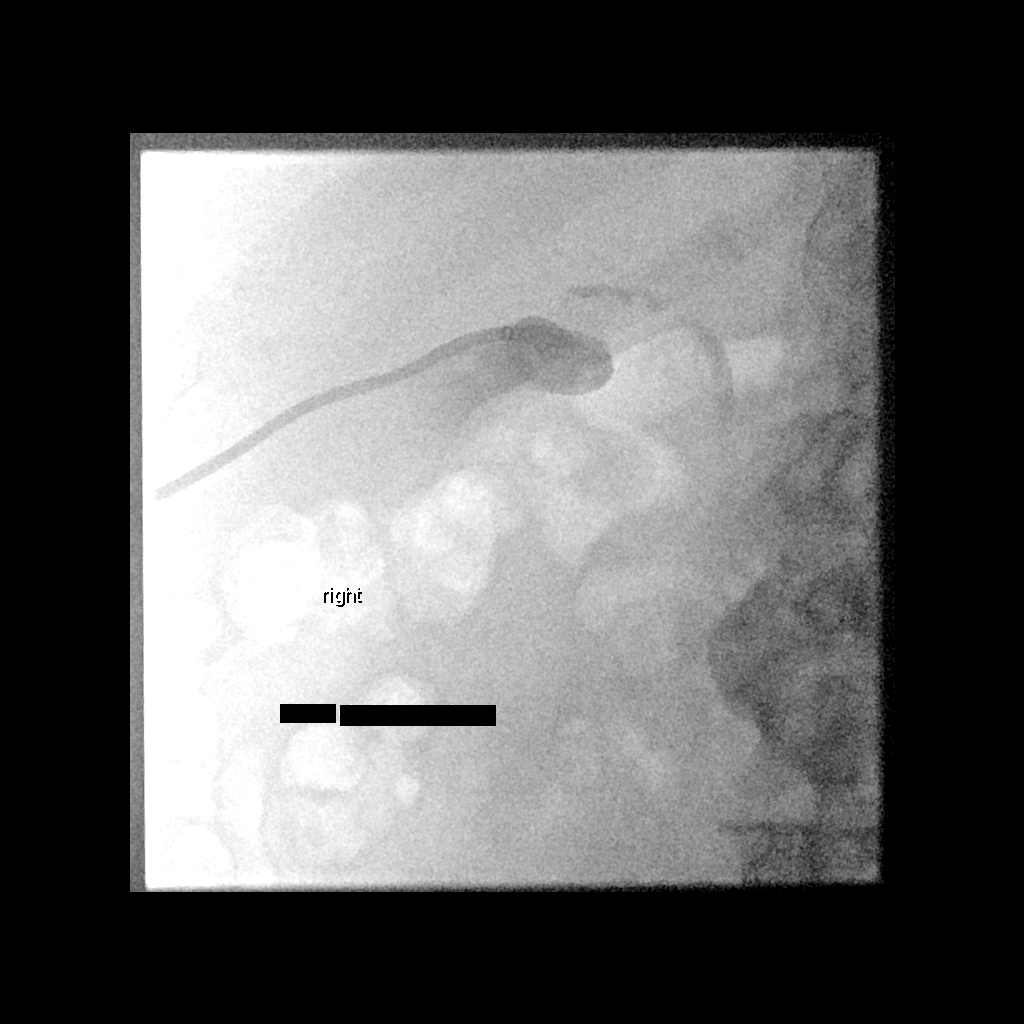

[3 of 3 positions shown; findings below may reference images not displayed]

EXAM:
CHOLECYSTOSTOMY TUBE EXCHANGE WITH FLUOROSCOPY

MEDICATIONS:
None

ANESTHESIA/SEDATION:
None

FLUOROSCOPY TIME:  Fluoroscopy Time: 1 minutes, 48 seconds, 43 mGy

COMPLICATIONS:
None immediate.

PROCEDURE:
Informed written consent was obtained from the patient after a
thorough discussion of the procedural risks, benefits and
alternatives. All questions were addressed. Maximal Sterile Barrier
Technique was utilized including caps, mask, sterile gowns, sterile
gloves, sterile drape, hand hygiene and skin antiseptic. A timeout
was performed prior to the initiation of the procedure.

Cholecystostomy tube was injected with contrast. The catheter was
cut and removed over a Bentson wire. 5 French catheter was used to
position the wire within the gallbladder lumen. A new 10.2 French
multipurpose drain was advanced over the Bentson wire and positioned
in the gallbladder. Additional contrast was injected to confirm
placement. Skin was anesthetized with 1% lidocaine and the catheter
was sutured to skin.

Fluoroscopic images were taken and saved for this procedure.
FINDINGS: Cholecystostomy tube is well positioned in the gallbladder. Cystic
duct is patent. Incomplete evaluation of the common bile duct.
IMPRESSION: Successful exchange of the cholecystostomy tube with fluoroscopy.

## 2019-07-21 MED ORDER — LIDOCAINE HCL 1 % IJ SOLN
INTRAMUSCULAR | Status: DC | PRN
  Administered 2019-07-21: 5 mL

## 2019-07-21 MED ORDER — IOHEXOL 300 MG/ML  SOLN
50.0000 mL | Freq: Once | INTRAMUSCULAR | Status: AC | PRN
  Administered 2019-07-21: 10:00:00 10 mL

## 2019-07-21 MED ORDER — LIDOCAINE HCL 1 % IJ SOLN
INTRAMUSCULAR | Status: AC
  Filled 2019-07-21: qty 20

## 2019-07-21 NOTE — Telephone Encounter (Signed)
Dr Lyndel Safe the first availability Dr Rush Landmark has would be 9/2, is this ok?

## 2019-07-21 NOTE — Procedures (Signed)
Interventional Radiology Procedure:   Indications: Cholecystostomy tube  Procedure: Drain change  Findings: Drain in Johnathan Keith.  Cystic duct patent.  Incomplete visualization of CBD.  Complications: None     EBL: Minimal, less than 10 ml  Plan: Continue drainage and recommend flushing drain every other day.     [name] R. Anselm Pancoast, MD  Pager: [phone]

## 2019-07-21 NOTE — Telephone Encounter (Signed)
-----   Message from Irving Copas., MD sent at 07/20/2019  6:10 PM EDT ----- Regarding: RE: EGD/ERCP Lev Cervone,  Let's see when the first availability for Dan or I would be. If it turns out to be too late for Dr. Lyndel Safe, then I will try to work something else out on a different day of the week as necessary with you, since he needs to have this done before his cholecystectomy. Thanks. GM ----- Message ----- From: Timothy Lasso, RN Sent: 07/20/2019   9:33 AM EDT To: Irving Copas., MD Subject: Melton Alar: EGD/ERCP                                    ----- Message ----- From: Karena Addison, CMA Sent: 07/20/2019   9:04 AM EDT To: Timothy Lasso, RN Subject: EGD/ERCP                                       Dr. Lyndel Safe wanted to see if Dr. Rush Landmark could do an ERCP and EGD on this patient within the next two weeks. Thanks for your help!!

## 2019-07-22 ENCOUNTER — Other Ambulatory Visit (HOSPITAL_COMMUNITY): Payer: Self-pay | Admitting: General Surgery

## 2019-07-22 ENCOUNTER — Other Ambulatory Visit (INDEPENDENT_AMBULATORY_CARE_PROVIDER_SITE_OTHER): Payer: Medicare HMO

## 2019-07-22 ENCOUNTER — Ambulatory Visit (HOSPITAL_COMMUNITY)
Admission: RE | Admit: 2019-07-22 | Discharge: 2019-07-22 | Disposition: A | Discharge status: Home / Self Care | Payer: Medicare HMO | Source: Ambulatory Visit | Attending: Gastroenterology | Admitting: Gastroenterology

## 2019-07-22 ENCOUNTER — Encounter (HOSPITAL_COMMUNITY): Payer: Self-pay

## 2019-07-22 DIAGNOSIS — Z8619 Personal history of other infectious and parasitic diseases: Secondary | ICD-10-CM | POA: Insufficient documentation

## 2019-07-22 DIAGNOSIS — R1310 Dysphagia, unspecified: Secondary | ICD-10-CM

## 2019-07-22 DIAGNOSIS — K529 Noninfective gastroenteritis and colitis, unspecified: Secondary | ICD-10-CM | POA: Diagnosis present

## 2019-07-22 DIAGNOSIS — K8010 Calculus of gallbladder with chronic cholecystitis without obstruction: Secondary | ICD-10-CM

## 2019-07-22 DIAGNOSIS — K219 Gastro-esophageal reflux disease without esophagitis: Secondary | ICD-10-CM

## 2019-07-22 DIAGNOSIS — Z800 Family history of malignant neoplasm of digestive organs: Secondary | ICD-10-CM | POA: Diagnosis present

## 2019-07-22 LAB — COMPREHENSIVE METABOLIC PANEL
ALT: 27 U/L (ref 0–53)
AST: 23 U/L (ref 0–37)
Albumin: 4.2 g/dL (ref 3.5–5.2)
Alkaline Phosphatase: 71 U/L (ref 39–117)
BUN: 23 mg/dL (ref 6–23)
CO2: 23 mEq/L (ref 19–32)
Calcium: 9.3 mg/dL (ref 8.4–10.5)
Chloride: 109 mEq/L (ref 96–112)
Creatinine, Ser: 1.05 mg/dL (ref 0.40–1.50)
GFR: 72.01 mL/min (ref >60.00)
Glucose, Bld: 149 mg/dL — ABNORMAL HIGH (ref 70–99)
Potassium: 3.8 mEq/L (ref 3.5–5.1)
Sodium: 143 mEq/L (ref 135–145)
Total Bilirubin: 0.6 mg/dL (ref 0.2–1.2)
Total Protein: 7.4 g/dL (ref 6.0–8.3)

## 2019-07-22 LAB — CBC WITH DIFFERENTIAL/PLATELET
Basophils Absolute: 0.1 10*3/uL (ref 0.0–0.1)
Basophils Relative: 0.6 % (ref 0.0–3.0)
Eosinophils Absolute: 0.3 10*3/uL (ref 0.0–0.7)
Eosinophils Relative: 2.2 % (ref 0.0–5.0)
HCT: 37.2 % — ABNORMAL LOW (ref 39.0–52.0)
Hemoglobin: 12 g/dL — ABNORMAL LOW (ref 13.0–17.0)
Lymphocytes Relative: 18.6 % (ref 12.0–46.0)
Lymphs Abs: 2.2 10*3/uL (ref 0.7–4.0)
MCHC: 32.4 g/dL (ref 30.0–36.0)
MCV: 90.4 fl (ref 78.0–100.0)
Monocytes Absolute: 1 10*3/uL (ref 0.1–1.0)
Monocytes Relative: 8.6 % (ref 3.0–12.0)
Neutro Abs: 8.4 10*3/uL — ABNORMAL HIGH (ref 1.4–7.7)
Neutrophils Relative %: 70 % (ref 43.0–77.0)
Platelets: 348 10*3/uL (ref 150.0–400.0)
RBC: 4.11 Mil/uL — ABNORMAL LOW (ref 4.22–5.81)
RDW: 16.7 % — ABNORMAL HIGH (ref 11.5–15.5)
WBC: 12 10*3/uL — ABNORMAL HIGH (ref 4.0–10.5)

## 2019-07-22 LAB — LIPASE: Lipase: 8 U/L — ABNORMAL LOW (ref 11.0–59.0)

## 2019-07-22 IMAGING — RF ESOPHAGUS/BARIUM SWALLOW/TABLET STUDY
10 series · 13 of 13 positions shown · non-contrast
Comparison: None.

CLINICAL DATA: Difficulty swallowing. Cessation of food sticking in
throat

EXAM:
ESOPHOGRAM / BARIUM SWALLOW / BARIUM TABLET STUDY
TECHNIQUE: Combined double contrast and single contrast examination performed
using effervescent crystals, thick barium liquid, and thin barium
liquid. The patient was observed with fluoroscopy swallowing a 13 mm
barium sulphate tablet.
FLUOROSCOPY TIME:  Fluoroscopy Time:  2 minutes 24 seconds
Radiation Exposure Index (if provided by the fluoroscopic device):
40.5 mGy
Number of Acquired Spot Images: 9

[Series 1: fluoro_barium 2fps_bw · 0.17mm/px · 1 of 1 slices shown (1 of 8)]
[im 1/1]
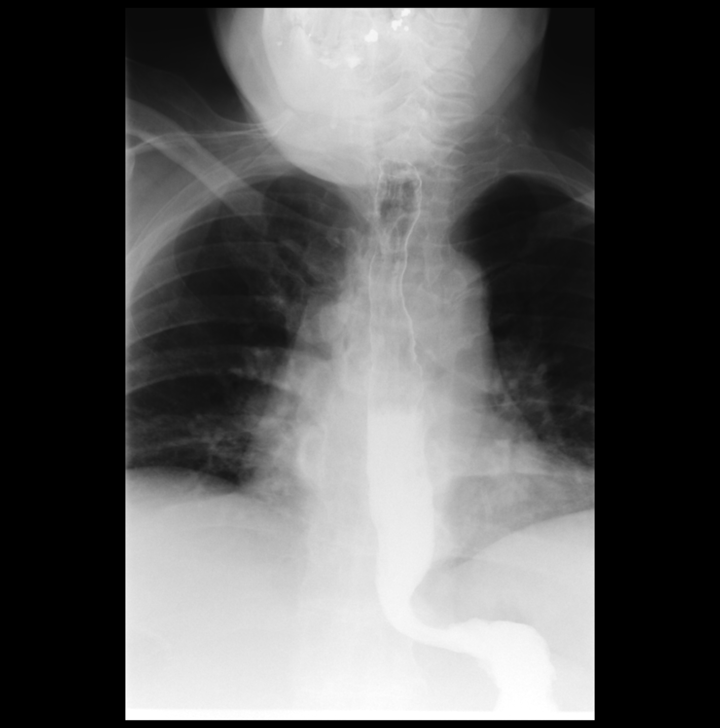

[Series 2: fluoro_barium 2fps_bw · 0.17mm/px · 1 of 1 slices shown (2 of 8)]
[im 1/1]
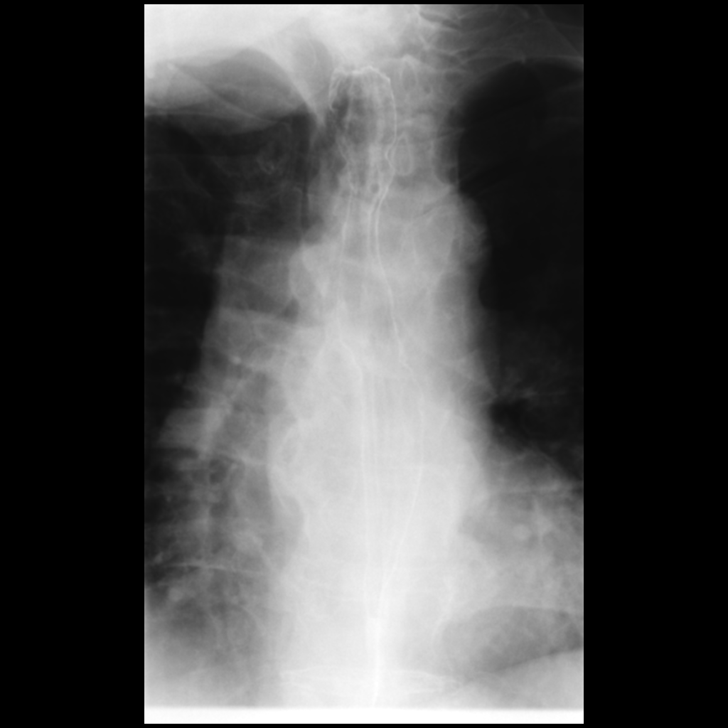

[Series 3: fluoro_barium 2fps_bw · 0.17mm/px · 1 of 1 slices shown (3 of 8)]
[im 1/1]
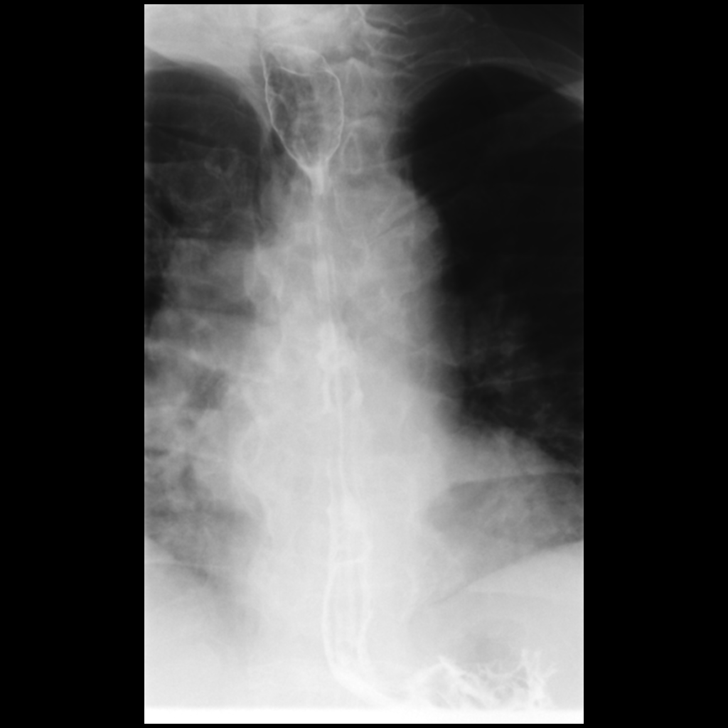

[Series 4: fluoro_barium 2fps_bw · 0.17mm/px · 1 of 1 slices shown (4 of 8)]
[im 1/1]
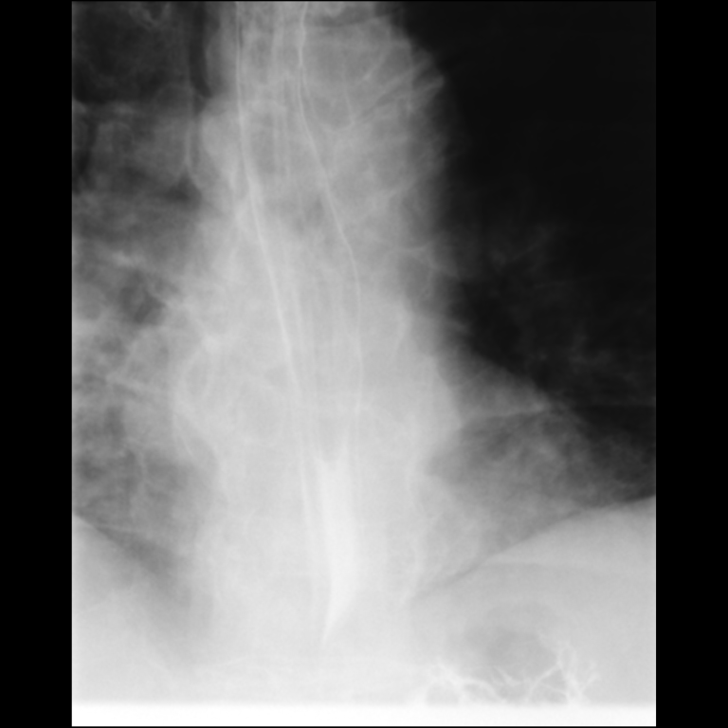

[Series 5: fluoro_barium 2fps_bw · 0.17mm/px · 1 of 1 slices shown (5 of 8)]
[im 1/1]
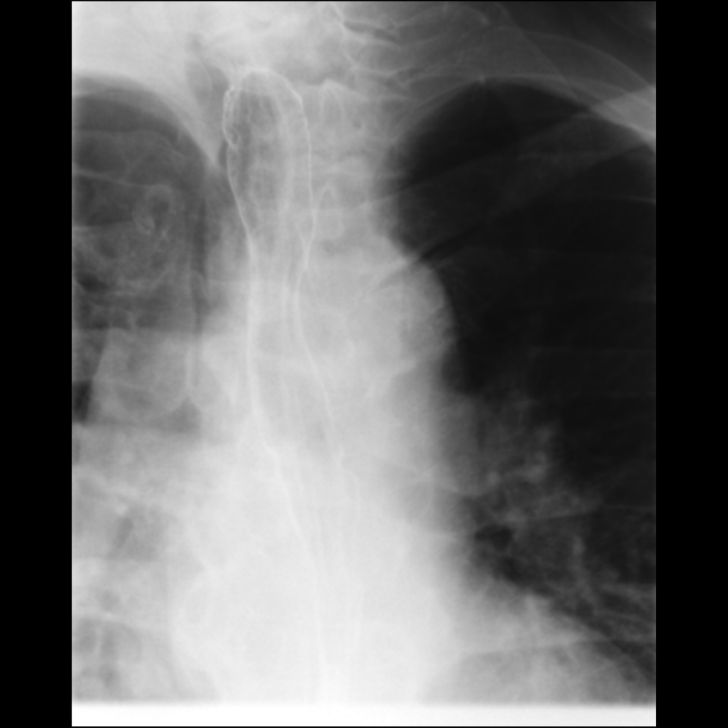

[Series 6: cp_standard · 0.34mm/px · 4 of 19 frames shown (1 of 2)]
[frame 1/19]
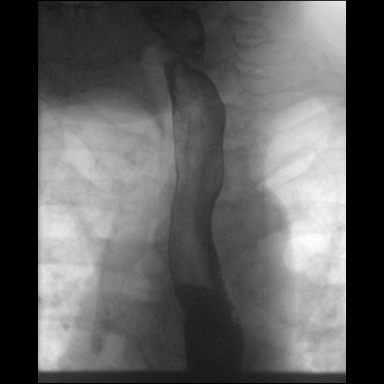
[frame 3/19]
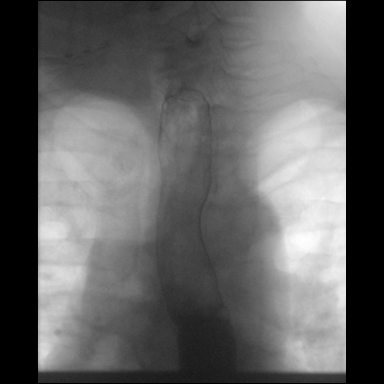
[frame 10/19]
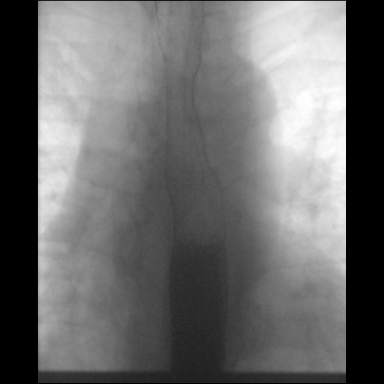
[frame 17/19]
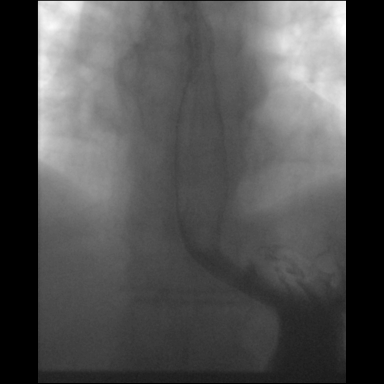

[Series 7: fluoro_barium 2fps_bw · 0.17mm/px · 1 of 1 slices shown (6 of 8)]
[im 1/1]
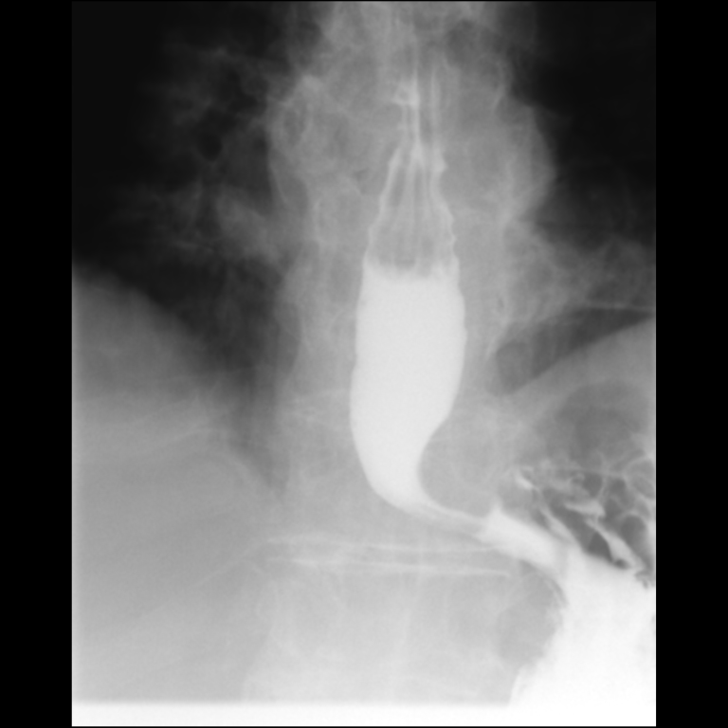

[Series 8: fluoro_barium 2fps_bw · 0.18mm/px · 1 of 1 slices shown (7 of 8)]
[im 1/1]
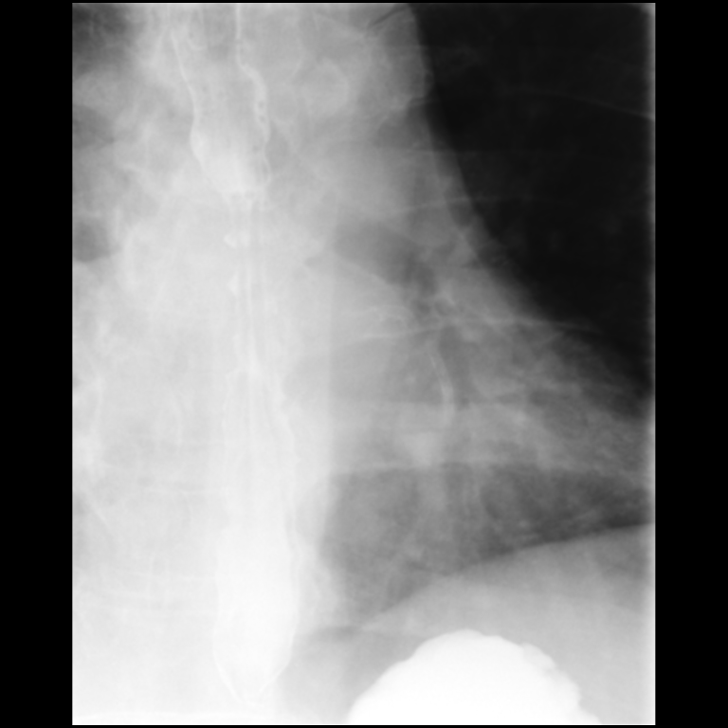

[Series 9: fluoro_barium 2fps_bw · 0.18mm/px · 1 of 1 slices shown (8 of 8)]
[im 1/1]
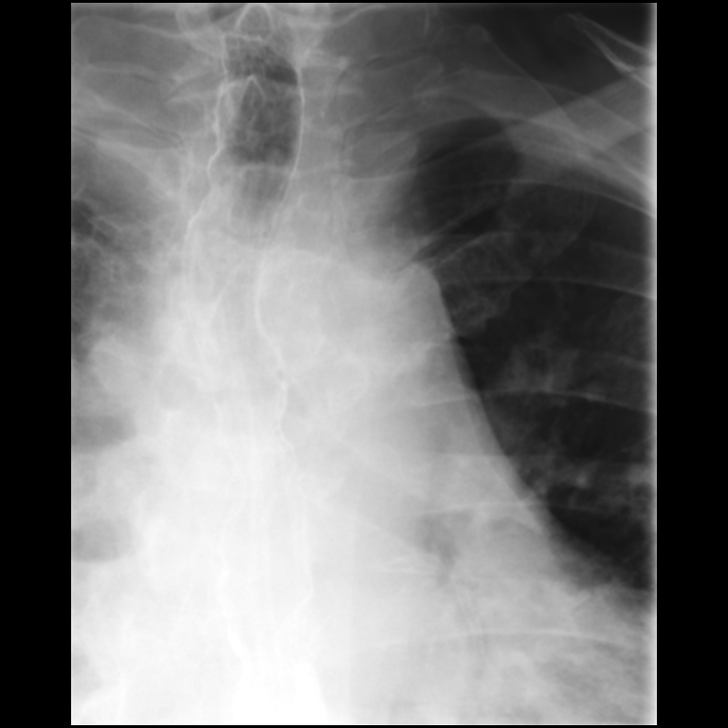

[Series 10: cp_standard · 0.18mm/px · 1 of 1 slices shown (2 of 2)]
[im 1/1]
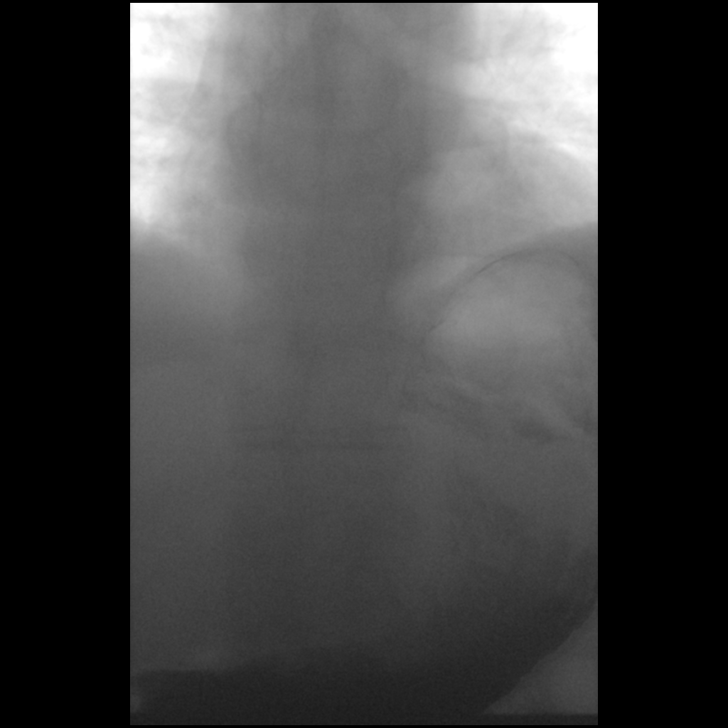

[13 of 13 positions shown; findings below may reference images not displayed]

FINDINGS: Exam is somewhat limited due to patient ability. No stricture mass
demonstrated within the thoracic esophagus or distal esophagus. No
mucosal irregularity identified. Mild esophageal dysmotility is
noted. No tertiary contractions. Mild gastroesophageal reflux. 13 mm
barium tab passed GE junction easily. No aspiration during exam.
IMPRESSION: 1. No mucosal irregularity within the thoracic esophagus or distal
esophagus.
2. No stricture mass.
3. Mild esophageal dysmotility.
4. mild gastroesophageal reflux.
5. GE junction patent.

## 2019-07-22 NOTE — Telephone Encounter (Signed)
Should be perfect I believe his lap chole is in second week of sept. Patty, Can you please get in touch with surgery and certainly they can do it sooner if need be.  Thx  RG

## 2019-07-22 NOTE — Telephone Encounter (Signed)
See alternate note sent by Dr Rush Landmark

## 2019-07-23 ENCOUNTER — Other Ambulatory Visit: Payer: Self-pay

## 2019-07-23 DIAGNOSIS — K219 Gastro-esophageal reflux disease without esophagitis: Secondary | ICD-10-CM

## 2019-07-23 DIAGNOSIS — K859 Acute pancreatitis without necrosis or infection, unspecified: Secondary | ICD-10-CM

## 2019-07-23 DIAGNOSIS — Z8619 Personal history of other infectious and parasitic diseases: Secondary | ICD-10-CM

## 2019-07-23 DIAGNOSIS — K831 Obstruction of bile duct: Secondary | ICD-10-CM

## 2019-07-23 DIAGNOSIS — R1310 Dysphagia, unspecified: Secondary | ICD-10-CM

## 2019-08-03 ENCOUNTER — Telehealth: Payer: Self-pay

## 2019-08-03 NOTE — Telephone Encounter (Signed)
I spoke with the pt's wife and she has been given the new time for the procedure.  She verbalized understanding and thanked me for calling.

## 2019-08-03 NOTE — Telephone Encounter (Signed)
The pt appt needs to be moved up to 830 am.  Arrive at 7 am. Left message on machine to call back

## 2019-08-07 ENCOUNTER — Other Ambulatory Visit (HOSPITAL_COMMUNITY)
Admission: RE | Admit: 2019-08-07 | Discharge: 2019-08-07 | Disposition: A | Discharge status: Home / Self Care | Payer: Medicare HMO | Source: Ambulatory Visit | Attending: Gastroenterology | Admitting: Gastroenterology

## 2019-08-07 DIAGNOSIS — Z01812 Encounter for preprocedural laboratory examination: Secondary | ICD-10-CM | POA: Diagnosis present

## 2019-08-07 DIAGNOSIS — Z20828 Contact with and (suspected) exposure to other viral communicable diseases: Secondary | ICD-10-CM | POA: Insufficient documentation

## 2019-08-07 LAB — SARS CORONAVIRUS 2 (TAT 6-24 HRS): SARS Coronavirus 2: NEGATIVE

## 2019-08-09 ENCOUNTER — Other Ambulatory Visit (INDEPENDENT_AMBULATORY_CARE_PROVIDER_SITE_OTHER): Payer: Medicare HMO

## 2019-08-09 ENCOUNTER — Telehealth: Payer: Self-pay

## 2019-08-09 DIAGNOSIS — K859 Acute pancreatitis without necrosis or infection, unspecified: Secondary | ICD-10-CM

## 2019-08-09 LAB — COMPREHENSIVE METABOLIC PANEL
ALT: 30 U/L (ref 0–53)
AST: 23 U/L (ref 0–37)
Albumin: 4.2 g/dL (ref 3.5–5.2)
Alkaline Phosphatase: 79 U/L (ref 39–117)
BUN: 32 mg/dL — ABNORMAL HIGH (ref 6–23)
CO2: 22 mEq/L (ref 19–32)
Calcium: 9.4 mg/dL (ref 8.4–10.5)
Chloride: 108 mEq/L (ref 96–112)
Creatinine, Ser: 1.21 mg/dL (ref 0.40–1.50)
GFR: 61.13 mL/min (ref >60.00)
Glucose, Bld: 191 mg/dL — ABNORMAL HIGH (ref 70–99)
Potassium: 3.5 mEq/L (ref 3.5–5.1)
Sodium: 143 mEq/L (ref 135–145)
Total Bilirubin: 0.5 mg/dL (ref 0.2–1.2)
Total Protein: 7.5 g/dL (ref 6.0–8.3)

## 2019-08-09 LAB — CBC WITH DIFFERENTIAL/PLATELET
Basophils Absolute: 0.1 10*3/uL (ref 0.0–0.1)
Basophils Relative: 0.7 % (ref 0.0–3.0)
Eosinophils Absolute: 0.4 10*3/uL (ref 0.0–0.7)
Eosinophils Relative: 3 % (ref 0.0–5.0)
HCT: 39 % (ref 39.0–52.0)
Hemoglobin: 12.8 g/dL — ABNORMAL LOW (ref 13.0–17.0)
Lymphocytes Relative: 19.2 % (ref 12.0–46.0)
Lymphs Abs: 2.4 10*3/uL (ref 0.7–4.0)
MCHC: 32.9 g/dL (ref 30.0–36.0)
MCV: 90.8 fl (ref 78.0–100.0)
Monocytes Absolute: 0.9 10*3/uL (ref 0.1–1.0)
Monocytes Relative: 7.4 % (ref 3.0–12.0)
Neutro Abs: 8.8 10*3/uL — ABNORMAL HIGH (ref 1.4–7.7)
Neutrophils Relative %: 69.7 % (ref 43.0–77.0)
Platelets: 286 10*3/uL (ref 150.0–400.0)
RBC: 4.29 Mil/uL (ref 4.22–5.81)
RDW: 15.4 % (ref 11.5–15.5)
WBC: 12.7 10*3/uL — ABNORMAL HIGH (ref 4.0–10.5)

## 2019-08-09 NOTE — Telephone Encounter (Signed)
Lab order in Epic left message for pt to return call and make sure he gets labs and get an update

## 2019-08-09 NOTE — Telephone Encounter (Signed)
Markus Daft, MD  Mansouraty, Telford Nab., MD; Leotis Pain; Timothy Lasso, RN; Jackquline Denmark, MD        If the tube continues to bleed, just let us know in IR and we can evaluate it.   Thanks,  Markus Daft

## 2019-08-09 NOTE — Telephone Encounter (Signed)
-----  Message from Irving Copas., MD sent at 08/09/2019  9:43 AM EDT ----- Regarding: RE: needs labs monday - has procedure 9/2 Johnathan Keith, Please let me know what is going on and we will see his labs when they return. If significant amount of bleeding is occurring from the region, then we should get him in touch with Interventional Radiology, they will want to know about this and see if they need to do anything with the cholecystostomy tube before my planned procedure on Wednesday. If significant amount of pain or discomfort, we likely will need to get a CTAP performed to ensure proper drainage location. Hopefully it is just a one time thing, since we didn't get any update of worsening on 8/30. Thanks. GM ----- Message ----- From: Leotis Pain Sent: 08/07/2019  10:37 AM EDT To: Timothy Lasso, RN, Irving Copas., MD Subject: needs labs monday - has procedure 9/2          Patient called today, Saturday stating that he had had some discomfort around his cholecystostomy tube over the past couple of days which has now resolved.  He says he has been seeing small clots off and on ever since the tube was placed, but yesterday started seeing some red blood mixed with bile.  He is unable to quantify but is not been having to empty the bag any more frequently than usual.He feels fine otherwise, he is not having any abdominal discomfort or pain around the cholecystostomy currently and no fever. He had been on Plavix which is now on hold preprocedure since yesterday.  I advised him to monitor the output and to call back if he has any increase in the amount of bleeding, or any other new symptoms.Chayanne Speir please put in orders for CBC, and c-Met for Monday, 08/09/2019.  I told him not to come before noon so we would have time to put lab orders in Thanks

## 2019-08-09 NOTE — Telephone Encounter (Signed)
-----  Message from Amy S Esterwood, PA-C sent at 08/07/2019 10:37 AM EDT ----- Regarding: needs labs monday - has procedure 9/2 Patient called today, Saturday stating that he had had some discomfort around his cholecystostomy tube over the past couple of days which has now resolved.  He says he has been seeing small clots off and on ever since the tube was placed, but yesterday started seeing some red blood mixed with bile.  He is unable to quantify but is not been having to empty the bag any more frequently than usual.He feels fine otherwise, he is not having any abdominal discomfort or pain around the cholecystostomy currently and no fever. He had been on Plavix which is now on hold preprocedure since yesterday.  I advised him to monitor the output and to call back if he has any increase in the amount of bleeding, or any other new symptoms. please put in orders for CBC, and c-Met for Monday, 08/09/2019.  I told him not to come before noon so we would have time to put lab orders in Thanks  

## 2019-08-10 ENCOUNTER — Other Ambulatory Visit: Payer: Self-pay

## 2019-08-10 ENCOUNTER — Encounter (HOSPITAL_COMMUNITY): Payer: Self-pay | Admitting: *Deleted

## 2019-08-10 NOTE — Telephone Encounter (Signed)
Thank you for update. Look forward to meeting patient and helping with this procedure soon. Thank you. GI

## 2019-08-10 NOTE — Telephone Encounter (Signed)
The pt returned call and states he stopped plavix as per instruction for procedure on 9/2 and that is when he began to see the blood.  He states it has resolved and he has no problems at this time.

## 2019-08-10 NOTE — Progress Notes (Addendum)
Mr Johnathan Keith denies chest pain - no shortness of breath at his time; patient is on oxygen most of the time. PCP is Dr Loura Back in [place], I have requested records. Mr Johnathan Keith has type II diabetes, patient reports that fasting CBG's run 70 -200. I instructed patient to not take Glimepride tonight.Patient takes a sliding scale of NPH and Novolog Regular at bedtime, I instructed patient to take 1/2 of NPH and no Regular insulin tonight or in bedtime.  I instructed patient that if CBG is greater than 70 in am to take 1/2 of NPH dose. I instructed patient to check CBG after awaking and every 2 hours until arrival  to the hospital.  I Instructed patient if CBG is less than 70 to drink 1/2 cup of apple juice. Recheck CBG in 15 minutes then call pre- op desk at [phone] for further instructions.   Mr Johnathan Keith reported that when he had a colonoscopy  that he "oxygen level dropped so low 3 times and had to be woke up." Patient said procedure was in [facility]- I requested records  I asked anesthesia PA-C to review chart.

## 2019-08-10 NOTE — Anesthesia Preprocedure Evaluation (Addendum)
Anesthesia Evaluation  Patient identified by MRN, date of birth, ID band Patient awake    Reviewed: Allergy & Precautions, H&P , NPO status , Patient's Chart, lab work & pertinent test results, reviewed documented beta blocker date and time   History of Anesthesia Complications (+) PONV  Airway Mallampati: II  TM Distance: >3 FB Neck ROM: Full    Dental no notable dental hx. (+) Poor Dentition, Dental Advisory Given   Pulmonary neg pulmonary ROS, sleep apnea and Oxygen sleep apnea ,    Pulmonary exam normal breath sounds clear to auscultation       Cardiovascular Exercise Tolerance: Good hypertension, Pt. on medications and Pt. on home beta blockers + CAD, + Past MI, + Cardiac Stents and + Peripheral Vascular Disease  negative cardio ROS   Rhythm:Regular Rate:Normal     Neuro/Psych  Headaches, CVA, Residual Symptoms negative neurological ROS  negative psych ROS   GI/Hepatic negative GI ROS, Neg liver ROS, GERD  Medicated,  Endo/Other  diabetes, Insulin DependentMorbid obesity  Renal/GU negative Renal ROS  negative genitourinary   Musculoskeletal  (+) Arthritis , Osteoarthritis,    Abdominal   Peds  Hematology negative hematology ROS (+)   Anesthesia Other Findings   Reproductive/Obstetrics negative OB ROS                          Anesthesia Physical Anesthesia Plan  ASA: IV  Anesthesia Plan: General   Post-op Pain Management:    Induction: Intravenous  PONV Risk Score and Plan: 3 and Ondansetron, Midazolam and Treatment may vary due to age or medical condition  Airway Management Planned: Oral ETT  Additional Equipment:   Intra-op Plan:   Post-operative Plan: Extubation in OR  Informed Consent: I have reviewed the patients History and Physical, chart, labs and discussed the procedure including the risks, benefits and alternatives for the proposed anesthesia with the patient  or authorized representative who has indicated his/her understanding and acceptance.     Dental advisory given  Plan Discussed with: CRNA  Anesthesia Plan Comments: (PAT note written 08/10/2019 by Myra Gianotti, PA-C. SAME DAY WORK-UP   )      Anesthesia Quick Evaluation

## 2019-08-10 NOTE — Progress Notes (Addendum)
Anesthesia Chart Review: SAME DAY WORK-UP (ENDO)  Case: [id number] Date/Time: 08/11/19 0830   Procedures:      ESOPHAGOGASTRODUODENOSCOPY (EGD) WITH PROPOFOL (N/A )     ENDOSCOPIC RETROGRADE CHOLANGIOPANCREATOGRAPHY (ERCP) WITH PROPOFOL (N/A )   Anesthesia type: Monitor Anesthesia Care   Pre-op diagnosis: CBD stone, dysphagia   Location: [facility] ROOM 1 / [place] [facility]   Surgeon: Mansouraty, Telford Nab., MD      DISCUSSION: Patient is a 58 year old male scheduled for the above procedure. Choledocholithiasis (on cholangiogram 06/30/19). Admitted 05/07/19-05/17/19 for acute gallstone cholecystitis and ascending cholangitis s/p cholecystostomy tube 05/11/19 by IR. Also with severe sepsis due to Klebsiella bacteremia and bile culture growing      Enterococcus faecalis, s/p Unasyn then Augmentin at discharge. Finished vancomycin for C. Difficile colitis 07/18/19. Last cholecystostomy tube change 07/21/19.    His cardiologist Dr. Novella Rob gave permission to hold Plavix for 5 days prior to procedure (scanned under Media tab). Plavix on hold since 08/06/19. Patient had recent [place] showing normal right sided pressures. 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 - He reported oxygen desaturations during colonoscopy at [facility]. 04/02/17 procedure report and moderate sedation observation records received. No complications or significant desaturations noted in those records.   He reported fasting CBGs ~ 70-200. Last  A1c seen was 9.3 on 12/09/18 ([place]).   Records requested from pulmonologist Dr. Alcide Clever by fax this morning, and I called around 4:00 PM and spoke with staff and was advised they would fax last office note, spirometry, and last split-night sleep study. As of 5:15 PM 08/10/19, pulmonology records not yet received. Anesthesia team to evaluate on the day of surgery. 08/07/19 COVID-19 test negative.    PROVIDERS: Charlotte Sanes, MD is PCP  Gardiner Rhyme, MD is pulmonologist [facility] Alert) Helene Kelp, MD is cardiologist ([facility]) Jackquline Denmark, MD is GI   LABS: Labs as of 08/09/19 include: Lab Results  Component Value Date   WBC 12.7 (H) 08/09/2019   HGB 12.8 (L) 08/09/2019   HCT 39.0 08/09/2019   PLT 286.0 08/09/2019   GLUCOSE 191 (H) 08/09/2019   ALT 30 08/09/2019   AST 23 08/09/2019   NA 143 08/09/2019   K 3.5 08/09/2019   CL 108 08/09/2019   CREATININE 1.21 08/09/2019   BUN 32 (H) 08/09/2019   CO2 22 08/09/2019   TSH 1.714 05/08/2019   INR 1.3 (H) 05/10/2019     IMAGES: 1V CXR 05/13/19 (during recent hospitalization): FINDINGS: Cardiac shadow is stable but accentuated by the portable technique. Right-sided PICC line is deep within the right atrium. The overall inspiratory effort is again poor. Mild central vascular congestion is again noted. No focal infiltrate is seen.   EKG: 05/09/19: NSR   CV: Echo 05/10/19 (during admission for acute cholecystitis/sepsis): IMPRESSIONS  1. The left ventricle has mild-moderately reduced systolic function, with an ejection fraction of 40-45%. The cavity size was normal. Left ventricular diastolic Doppler parameters are consistent with impaired relaxation. Left ventricular diffuse  hypokinesis.  2. The right ventricle  has normal systolic function. The cavity was normal. There is no increase in right ventricular wall thickness.  3. Left atrial size was mildly dilated.  4. The aortic valve has an  indeterminate number of cusps. Aortic valve regurgitation is mild by color flow Doppler.  5. The aortic valve appears functionally bicuspid.  6. The aortic root and ascending aorta are normal in size and structure.  7. The interatrial septum was not well visualized.   [place] 05/04/19 ([facility]): RESULTS:  Hemodynamics:   PA pressure= 33/12 mmHg  RV pressure= 35/10 mmHg  RA pressure= 12 mmHg  Pulmonary capillary wedge pressure= 10 mmHg  Cardiac output by Fick= 4.9 L/m squared  CONCLUSIONS:  1. Normal right-sided pressures with normal pulmonary capillary wedge pressure.    Nuclear stress test 02/22/19: Report not viewable in [place], so requested from [facility].  According to 02/22/19 office note by Dr. Novella Rob, "Negative Lexiscan earlier today".   Carotid US 11/07/17: Impression: 1. Patent right carotid endarterectomy with < 40% stenosis. 2. Less than 40% left ICA stenosis. 3. Left external carotid artery stenosis.   LHC 09/29/17: Conclusions:  Prox LAD to Mid LAD lesion, 0 %stenosed.  Mid LAD to Dist LAD lesion, 0 %stenosed.  Dist LAD lesion, 80 %stenosed.  Post intervention, there is a 5% residual stenosis.  A stent was successfully placed.  The left ventricular systolic function is normal.  LV end diastolic pressure is normal.  The left ventricular ejection fraction is 55-65% by visual estimate. PCI: Successful mid to distal LAD primary PCI and drug-eluting stenting using a Synergy 2.25 mm x 12 mm millimeter long drug-eluting stent deployed at 2.43 mm (16 atm).    Past Medical History:  Diagnosis Date  . Arthritis   . Chronic neck and back pain   . Coronary artery disease    drug-eluting stents placed in proximal and mid LAD  . Deviated septum   . Diabetic retinopathy ([place])   . GERD (gastroesophageal reflux disease)    "occasionally" (03/23/2015)  . Headache    "more than 2/wk" (03/23/2015)  . Heart attack ([place])    "in 1997 was told I had  signs of a small heart attack that I didn't know I'd had"  . High cholesterol   . History of gout    "when I was younger"  . History of kidney stones    passed  . Hypertension   . Kidney disease   . Migraine    "had my 1st and only in 12/2014" (03/23/2015)  . Peripheral vascular disease ([place])    carotid artery disease  . PONV (postoperative nausea and vomiting) 2015   "was told I was confused and combative in recovery from the narcotics w/my carotid OR"  "During a colonoscopy my oxygen level dropped so low that I had to be woke up.- 03/2017 colonoscopy [facility]   . Sleep apnea    "severe; couldn't afford mask" (03/23/2015)  . Stroke [facility]) 2014   "on walker for 2 months; left side is weaker and numb since" (03/23/2015), "2 confirmed strokes"  . Type II diabetes mellitus ([place])     Past Surgical History:  Procedure Laterality Date  . CIRCUMCISION  1981  . COLONOSCOPY W/ POLYPECTOMY    . CORONARY ANGIOPLASTY WITH STENT PLACEMENT  03/23/2015   "2"  . CORONARY STENT INTERVENTION N/A 09/29/2017   Procedure: CORONARY STENT INTERVENTION;  Surgeon: Lorretta Harp, MD;  Location: [place] CV LAB;  Service: Cardiovascular;  Laterality:  N/A;  . ENDARTERECTOMY Right 09/26/2014   Procedure: RIGHT CAROTID ENDARTERECTOMY WITH PATCH ANGIOPLASTY;  Surgeon: Conrad Utah, MD;  Location: [facility];  Service: Vascular;  Laterality: Right;  . EYE SURGERY Right May 2016   Cataract  . EYE SURGERY Left July 2016   Cataract  . HYDROCELE EXCISION  ~ 2008  . IR EXCHANGE BILIARY DRAIN  07/21/2019  . IR PERC CHOLECYSTOSTOMY  05/11/2019  . IR RADIOLOGIST EVAL & MGMT  06/30/2019  . LEFT HEART CATH AND CORONARY ANGIOGRAPHY N/A 09/29/2017   Procedure: LEFT HEART CATH AND CORONARY ANGIOGRAPHY;  Surgeon: Lorretta Harp, MD;  Location: [place] [facility];  Service: Cardiovascular;  Laterality: N/A;  . LEFT HEART CATHETERIZATION WITH CORONARY ANGIOGRAM N/A 03/23/2015   Procedure: LEFT HEART CATHETERIZATION WITH  CORONARY ANGIOGRAM;  Surgeon: Lorretta Harp, MD;  Location: [facility] C[facility] LAB;  Service: Cardiovascular;  Laterality: N/A;  . REFRACTIVE SURGERY Bilateral 08/2014-09/2014   Diabetic retinopathy     MEDICATIONS: No current facility-administered medications for this encounter.    Marland Kitchen albuterol (PROVENTIL HFA;VENTOLIN HFA) 108 (90 Base) MCG/ACT inhaler  . aspirin 81 MG tablet  . benazepril (LOTENSIN) 10 MG tablet  . cholecalciferol (VITAMIN D3) 25 MCG (1000 UT) tablet  . dicyclomine (BENTYL) 10 MG capsule  . diphenoxylate-atropine (LOMOTIL) 2.5-0.025 MG tablet  . fluocinonide ointment (LIDEX) 0.05 %  . gabapentin (NEURONTIN) 300 MG capsule  . glimepiride (AMARYL) 4 MG tablet  . insulin NPH Human (HUMULIN N,NOVOLIN N) 100 UNIT/ML injection  . insulin regular (NOVOLIN R) 100 units/mL injection  . ipratropium-albuterol (DUONEB) 0.5-2.5 (3) MG/3ML SOLN  . lipase/protease/amylase (CREON) 36000 UNITS CPEP capsule  . loperamide (IMODIUM A-D) 2 MG tablet  . metoprolol succinate (TOPROL-XL) 25 MG 24 hr tablet  . montelukast (SINGULAIR) 10 MG tablet  . nitroGLYCERIN (NITROSTAT) 0.4 MG SL tablet  . pantoprazole (PROTONIX) 40 MG tablet  . Polyvinyl Alcohol-Povidone (REFRESH OP)  . pravastatin (PRAVACHOL) 20 MG tablet  . torsemide (DEMADEX) 20 MG tablet  . clopidogrel (PLAVIX) 75 MG tablet  . oxybutynin (DITROPAN) 5 MG tablet  . tamsulosin (FLOMAX) 0.4 MG CAPS capsule     Myra Gianotti, PA-C Surgical Short Stay/Anesthesiology [facility] Phone [phone] [facility] Phone [phone] 08/10/2019 5:15 PM

## 2019-08-10 NOTE — Telephone Encounter (Signed)
Dr Rush Landmark the pt came in for labs.  Results are in Epic

## 2019-08-11 ENCOUNTER — Encounter (HOSPITAL_COMMUNITY)
Admission: RE | Disposition: A | Discharge status: Home / Self Care | Payer: Self-pay | Source: Home / Self Care | Attending: Gastroenterology

## 2019-08-11 ENCOUNTER — Ambulatory Visit (HOSPITAL_COMMUNITY): Payer: Medicare HMO | Admitting: Vascular Surgery

## 2019-08-11 ENCOUNTER — Encounter (HOSPITAL_COMMUNITY): Payer: Self-pay | Admitting: Certified Registered Nurse Anesthetist

## 2019-08-11 ENCOUNTER — Ambulatory Visit (HOSPITAL_COMMUNITY)
Admission: RE | Admit: 2019-08-11 | Discharge: 2019-08-11 | Disposition: A | Discharge status: Home / Self Care | Payer: Medicare HMO | Attending: Gastroenterology | Admitting: Gastroenterology

## 2019-08-11 ENCOUNTER — Ambulatory Visit (HOSPITAL_COMMUNITY): Payer: Medicare HMO

## 2019-08-11 DIAGNOSIS — Z7982 Long term (current) use of aspirin: Secondary | ICD-10-CM | POA: Diagnosis not present

## 2019-08-11 DIAGNOSIS — Z6838 Body mass index (BMI) 38.0-38.9, adult: Secondary | ICD-10-CM | POA: Insufficient documentation

## 2019-08-11 DIAGNOSIS — K8051 Calculus of bile duct without cholangitis or cholecystitis with obstruction: Secondary | ICD-10-CM | POA: Insufficient documentation

## 2019-08-11 DIAGNOSIS — Z79899 Other long term (current) drug therapy: Secondary | ICD-10-CM | POA: Insufficient documentation

## 2019-08-11 DIAGNOSIS — Z8619 Personal history of other infectious and parasitic diseases: Secondary | ICD-10-CM

## 2019-08-11 DIAGNOSIS — Z7902 Long term (current) use of antithrombotics/antiplatelets: Secondary | ICD-10-CM | POA: Diagnosis not present

## 2019-08-11 DIAGNOSIS — I2510 Atherosclerotic heart disease of native coronary artery without angina pectoris: Secondary | ICD-10-CM | POA: Diagnosis not present

## 2019-08-11 DIAGNOSIS — E1151 Type 2 diabetes mellitus with diabetic peripheral angiopathy without gangrene: Secondary | ICD-10-CM | POA: Insufficient documentation

## 2019-08-11 DIAGNOSIS — E7800 Pure hypercholesterolemia, unspecified: Secondary | ICD-10-CM | POA: Diagnosis not present

## 2019-08-11 DIAGNOSIS — I10 Essential (primary) hypertension: Secondary | ICD-10-CM | POA: Diagnosis not present

## 2019-08-11 DIAGNOSIS — Z794 Long term (current) use of insulin: Secondary | ICD-10-CM | POA: Diagnosis not present

## 2019-08-11 DIAGNOSIS — K8050 Calculus of bile duct without cholangitis or cholecystitis without obstruction: Secondary | ICD-10-CM | POA: Diagnosis present

## 2019-08-11 DIAGNOSIS — Z7984 Long term (current) use of oral hypoglycemic drugs: Secondary | ICD-10-CM | POA: Insufficient documentation

## 2019-08-11 DIAGNOSIS — K317 Polyp of stomach and duodenum: Secondary | ICD-10-CM | POA: Insufficient documentation

## 2019-08-11 DIAGNOSIS — K219 Gastro-esophageal reflux disease without esophagitis: Secondary | ICD-10-CM | POA: Diagnosis not present

## 2019-08-11 DIAGNOSIS — Q399 Congenital malformation of esophagus, unspecified: Secondary | ICD-10-CM | POA: Insufficient documentation

## 2019-08-11 DIAGNOSIS — I252 Old myocardial infarction: Secondary | ICD-10-CM | POA: Diagnosis not present

## 2019-08-11 DIAGNOSIS — Z8673 Personal history of transient ischemic attack (TIA), and cerebral infarction without residual deficits: Secondary | ICD-10-CM | POA: Diagnosis not present

## 2019-08-11 DIAGNOSIS — Z9981 Dependence on supplemental oxygen: Secondary | ICD-10-CM | POA: Diagnosis not present

## 2019-08-11 DIAGNOSIS — G4733 Obstructive sleep apnea (adult) (pediatric): Secondary | ICD-10-CM | POA: Insufficient documentation

## 2019-08-11 DIAGNOSIS — Z955 Presence of coronary angioplasty implant and graft: Secondary | ICD-10-CM | POA: Diagnosis not present

## 2019-08-11 DIAGNOSIS — K831 Obstruction of bile duct: Secondary | ICD-10-CM

## 2019-08-11 DIAGNOSIS — K859 Acute pancreatitis without necrosis or infection, unspecified: Secondary | ICD-10-CM

## 2019-08-11 DIAGNOSIS — R1310 Dysphagia, unspecified: Secondary | ICD-10-CM | POA: Diagnosis not present

## 2019-08-11 DIAGNOSIS — M1990 Unspecified osteoarthritis, unspecified site: Secondary | ICD-10-CM | POA: Insufficient documentation

## 2019-08-11 HISTORY — PX: BILIARY DILATION: SHX6850

## 2019-08-11 HISTORY — DX: Personal history of urinary calculi: Z87.442

## 2019-08-11 HISTORY — DX: Unspecified osteoarthritis, unspecified site: M19.90

## 2019-08-11 HISTORY — PX: REMOVAL OF STONES: SHX5545

## 2019-08-11 HISTORY — PX: ENDOSCOPIC RETROGRADE CHOLANGIOPANCREATOGRAPHY (ERCP) WITH PROPOFOL: SHX5810

## 2019-08-11 HISTORY — PX: ESOPHAGOGASTRODUODENOSCOPY (EGD) WITH PROPOFOL: SHX5813

## 2019-08-11 HISTORY — PX: SAVORY DILATION: SHX5439

## 2019-08-11 HISTORY — DX: Disorder of kidney and ureter, unspecified: N28.9

## 2019-08-11 HISTORY — PX: SPHINCTEROTOMY: SHX5544

## 2019-08-11 HISTORY — PX: BIOPSY: SHX5522

## 2019-08-11 LAB — GLUCOSE, CAPILLARY
Glucose-Capillary: 182 mg/dL — ABNORMAL HIGH (ref 70–99)
Glucose-Capillary: 271 mg/dL — ABNORMAL HIGH (ref 70–99)

## 2019-08-11 IMAGING — RF DG ERCP WO/W SPHINCTEROTOMY
1 series · 10 of 10 positions shown · non-contrast
Comparison: Image guided cholecystostomy tube placement-[DATE];

CLINICAL DATA: Choledocholithiasis. Patient with history of
cholecystostomy tube. ERCP.

EXAM:
ERCP
TECHNIQUE: Multiple spot images obtained with the fluoroscopic device and
submitted for interpretation post-procedure.

[Series 1: unknown protocol · 0.20mm/px · 7 acquisitions, 10 frames shown]
[im 1/7]
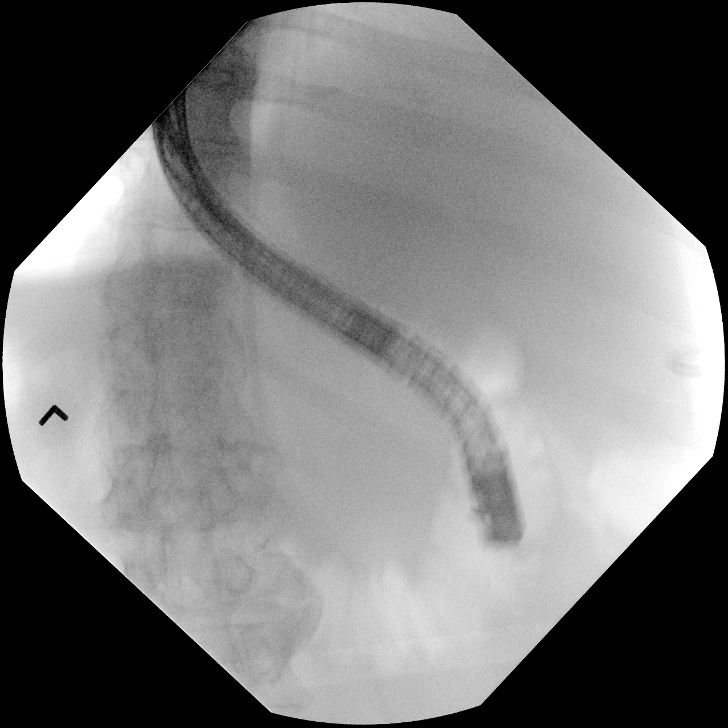
[im 2/7]
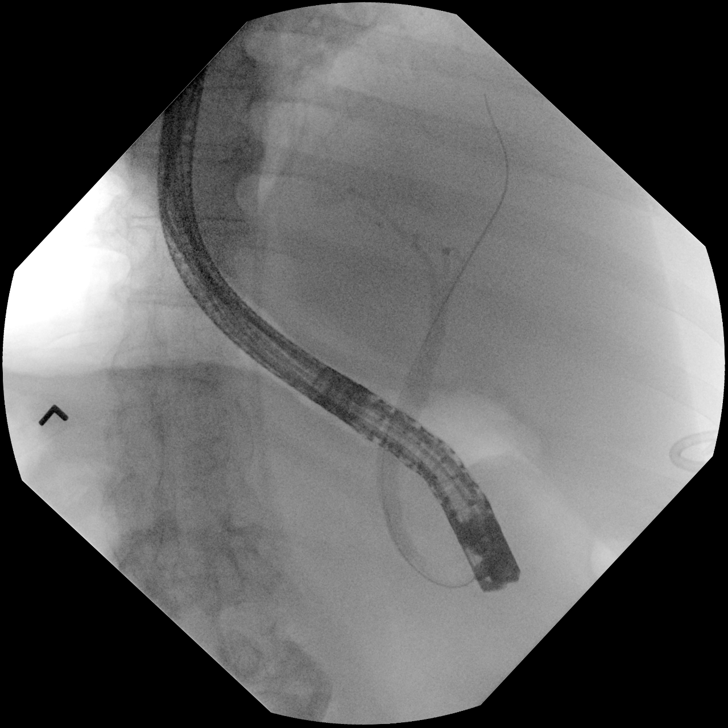
[im 3/7]
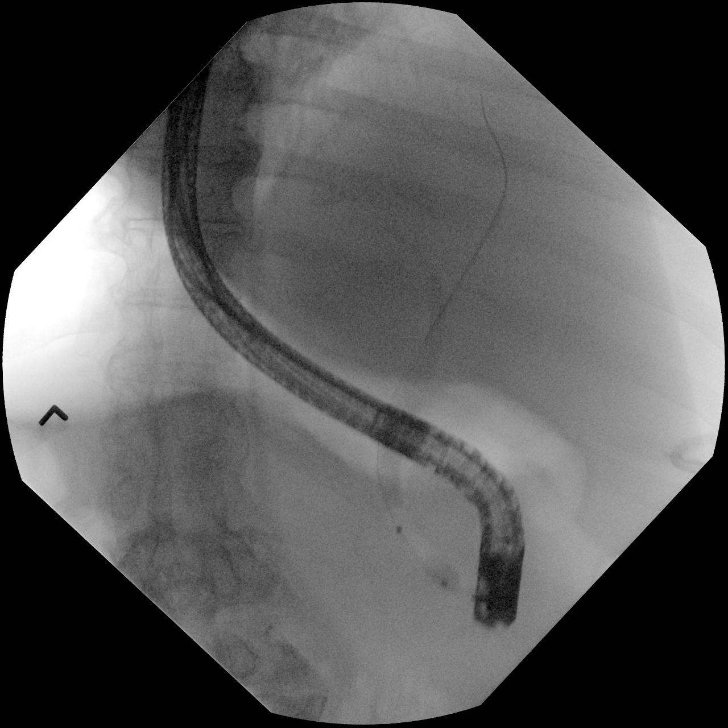
[im 4/7]
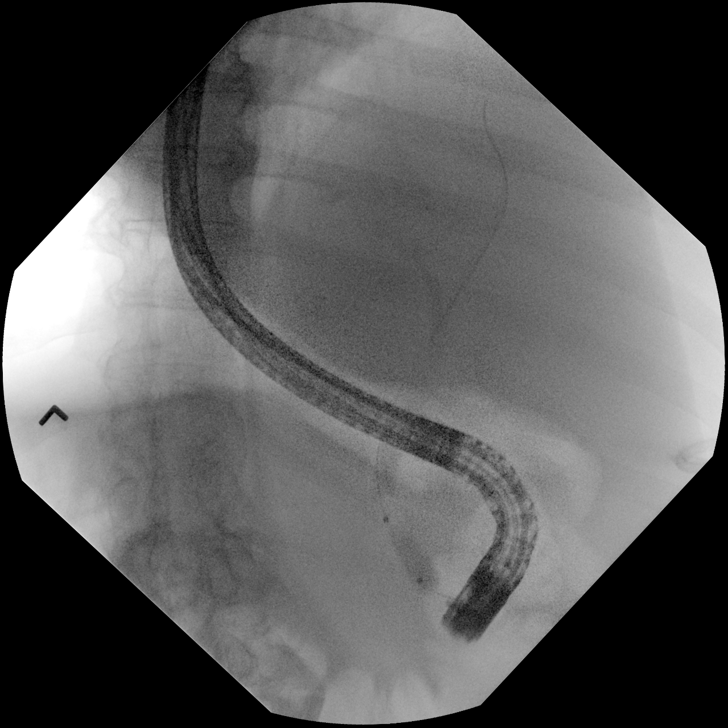
[im 5/7]
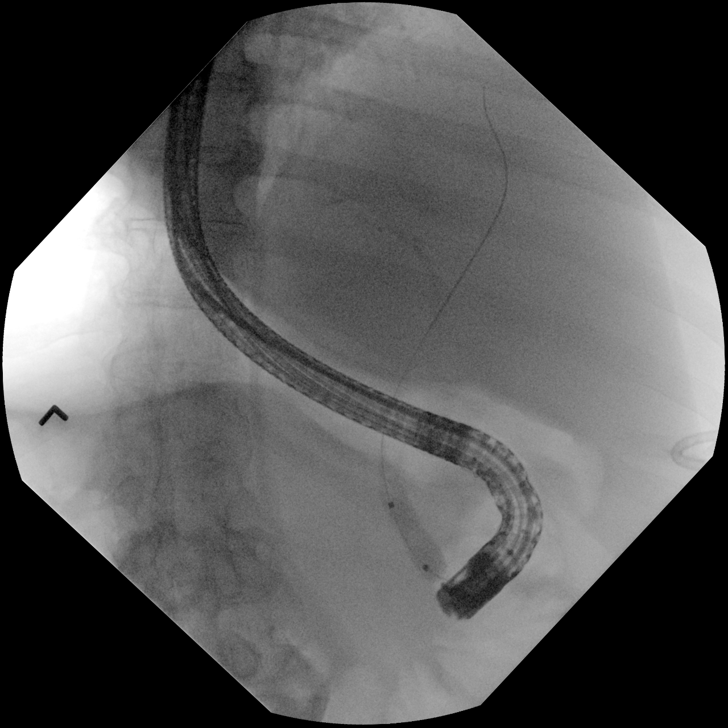
[im 6/7]
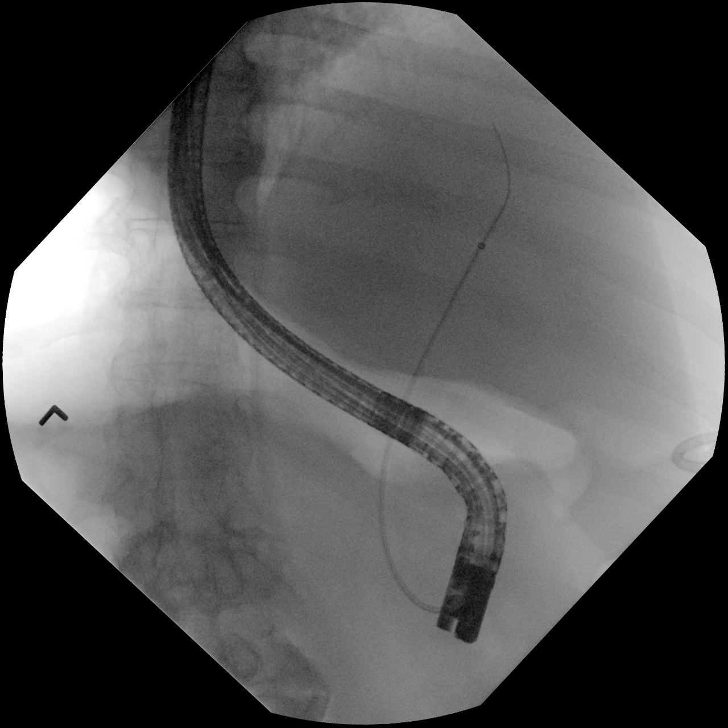
[im 6/7]
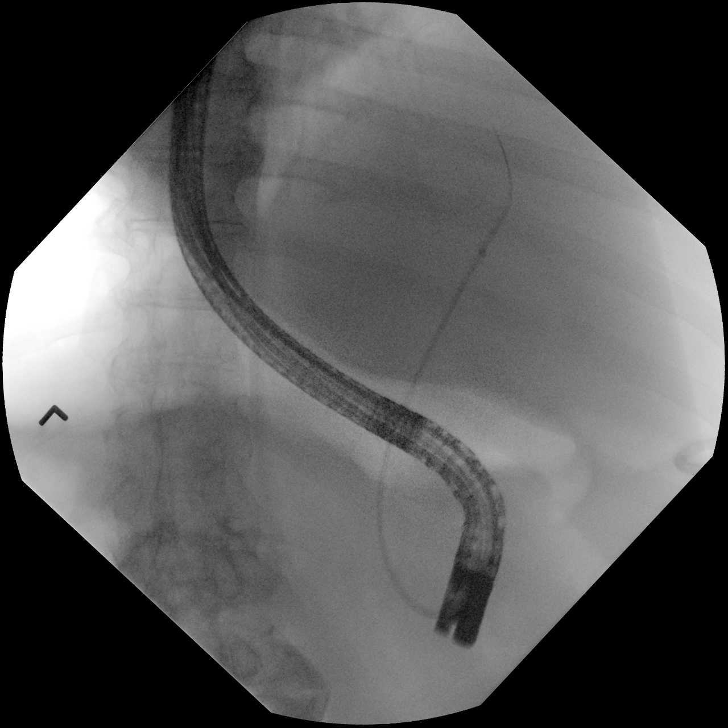
[im 6/7]
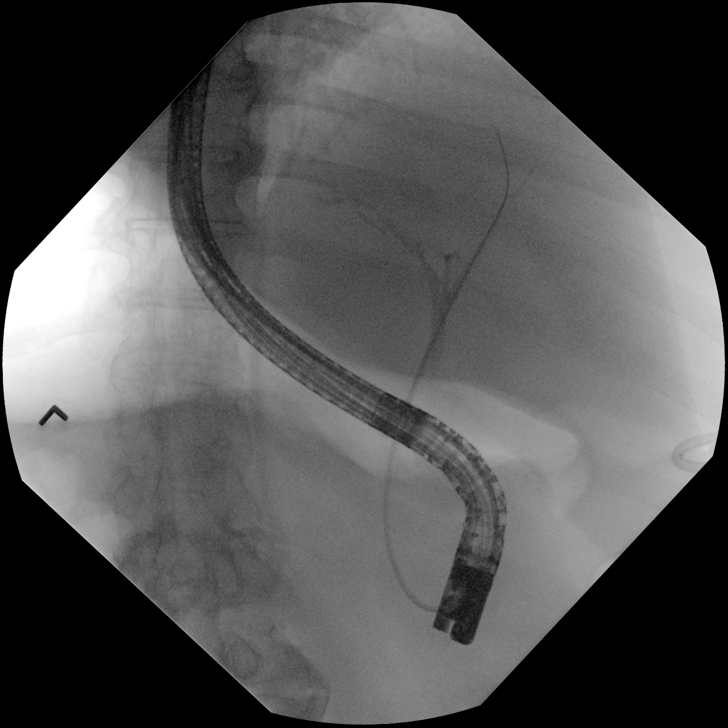
[im 6/7]
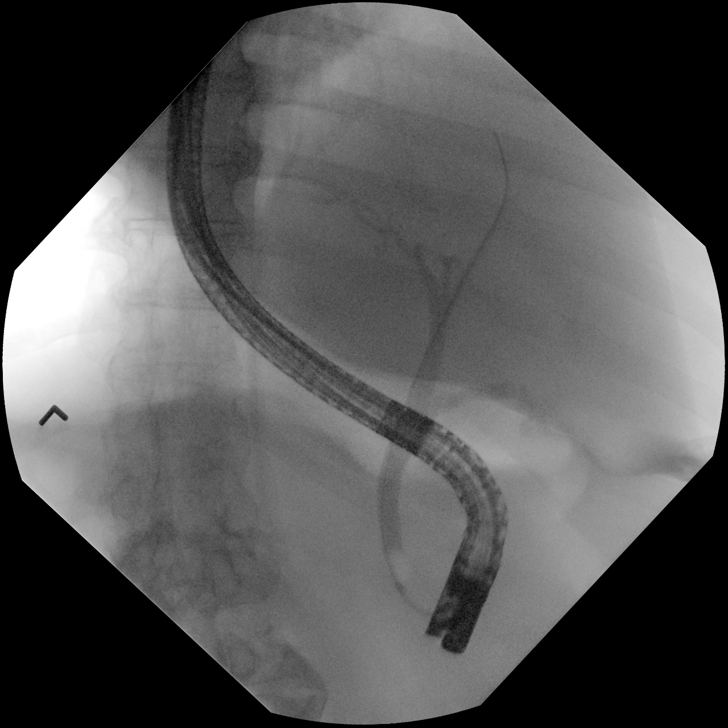
[im 7/7]
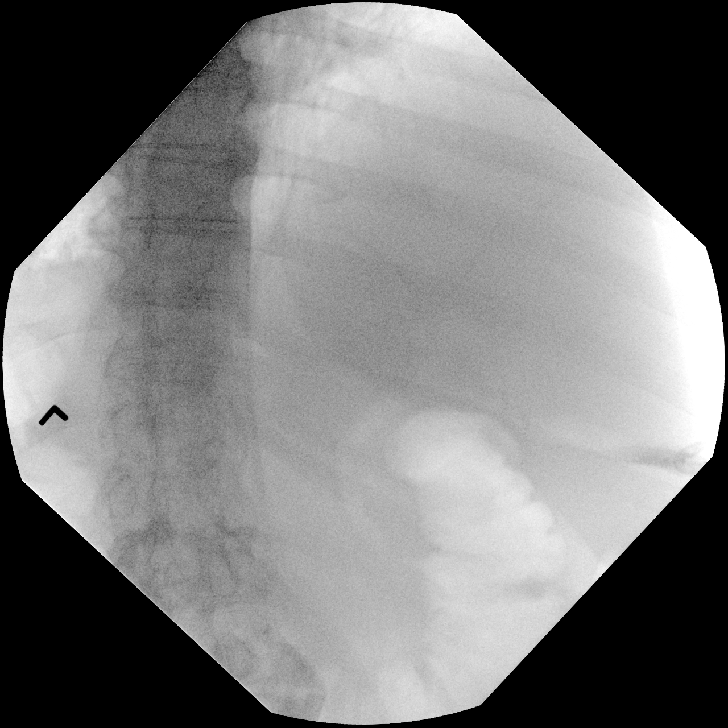

[10 of 10 positions shown; findings below may reference images not displayed]

cholangiogram via existing cholecystostomy tube-[DATE]

FLUOROSCOPY TIME:  2 minutes, 2 seconds
FINDINGS: Six spot intraoperative fluoroscopic images of the right upper
abdominal quadrant during ERCP are provided for review.

Initial image demonstrates an ERCP probe overlying the right upper
abdominal quadrant. The central aspect of known cholecystostomy tube
overlies the right upper abdominal quadrant.

Subsequent images demonstrate selective cannulation and
opacification of the common bile duct.

Subsequent images demonstrate insufflation of a balloon within the
distal aspect of the CBD as well as biliary sweeping.

There is minimal opacification the cystic duct and neck of the
decompressed gallbladder. There is minimal opacification of the
intrahepatic biliary tree which appears nondilated. There is no
definitive opacification of the pancreatic duct.
IMPRESSION: ERCP with biliary sphincterotomy and sweeping as detailed above.

These images were submitted for radiologic interpretation only.
Please see the procedural report for the amount of contrast and the
fluoroscopy time utilized.

## 2019-08-11 SURGERY — ESOPHAGOGASTRODUODENOSCOPY (EGD) WITH PROPOFOL
Anesthesia: General

## 2019-08-11 MED ORDER — LACTATED RINGERS IV SOLN
INTRAVENOUS | Status: AC | PRN
  Administered 2019-08-11: 10:00:00 via INTRAVENOUS
  Administered 2019-08-11: 1000 mL via INTRAVENOUS

## 2019-08-11 MED ORDER — INDOMETHACIN 50 MG RE SUPP
RECTAL | Status: DC | PRN
  Administered 2019-08-11: 100 mg via RECTAL

## 2019-08-11 MED ORDER — LIDOCAINE HCL (CARDIAC) PF 100 MG/5ML IV SOSY
PREFILLED_SYRINGE | INTRAVENOUS | Status: DC | PRN
  Administered 2019-08-11: 60 mg via INTRAVENOUS

## 2019-08-11 MED ORDER — SUCCINYLCHOLINE CHLORIDE 20 MG/ML IJ SOLN
INTRAMUSCULAR | Status: DC | PRN
  Administered 2019-08-11: 120 mg via INTRAVENOUS

## 2019-08-11 MED ORDER — PROPOFOL 10 MG/ML IV BOLUS
INTRAVENOUS | Status: DC | PRN
  Administered 2019-08-11: 100 mg via INTRAVENOUS

## 2019-08-11 MED ORDER — FENTANYL CITRATE (PF) 100 MCG/2ML IJ SOLN
INTRAMUSCULAR | Status: DC | PRN
  Administered 2019-08-11: 100 ug via INTRAVENOUS

## 2019-08-11 MED ORDER — PHENYLEPHRINE HCL (PRESSORS) 10 MG/ML IV SOLN
INTRAVENOUS | Status: DC | PRN
  Administered 2019-08-11: 120 ug via INTRAVENOUS
  Administered 2019-08-11 (×3): 80 ug via INTRAVENOUS

## 2019-08-11 MED ORDER — INDOMETHACIN 50 MG RE SUPP
RECTAL | Status: AC
  Filled 2019-08-11: qty 2

## 2019-08-11 MED ORDER — DEXMEDETOMIDINE HCL 200 MCG/2ML IV SOLN
INTRAVENOUS | Status: DC | PRN
  Administered 2019-08-11 (×2): 20 ug via INTRAVENOUS

## 2019-08-11 MED ORDER — GLUCAGON HCL RDNA (DIAGNOSTIC) 1 MG IJ SOLR
INTRAMUSCULAR | Status: DC | PRN
  Administered 2019-08-11 (×3): 0.25 mg via INTRAVENOUS

## 2019-08-11 MED ORDER — SODIUM CHLORIDE 0.9 % IV SOLN: INTRAVENOUS | Status: DC

## 2019-08-11 MED ORDER — ONDANSETRON HCL 4 MG/2ML IJ SOLN
INTRAMUSCULAR | Status: DC | PRN
  Administered 2019-08-11: 4 mg via INTRAVENOUS

## 2019-08-11 MED ORDER — DEXAMETHASONE SODIUM PHOSPHATE 10 MG/ML IJ SOLN
INTRAMUSCULAR | Status: DC | PRN
  Administered 2019-08-11: 5 mg via INTRAVENOUS

## 2019-08-11 MED ORDER — CLOPIDOGREL BISULFATE 75 MG PO TABS: 75.0000 mg | ORAL_TABLET | Freq: Every day | ORAL | 1 refills | Status: DC

## 2019-08-11 MED ORDER — GLUCAGON HCL RDNA (DIAGNOSTIC) 1 MG IJ SOLR
INTRAMUSCULAR | Status: AC
  Filled 2019-08-11: qty 2

## 2019-08-11 MED ORDER — MIDAZOLAM HCL 5 MG/5ML IJ SOLN
INTRAMUSCULAR | Status: DC | PRN
  Administered 2019-08-11: 2 mg via INTRAVENOUS

## 2019-08-11 MED ORDER — SODIUM CHLORIDE 0.9 % IV SOLN
INTRAVENOUS | Status: DC | PRN
  Administered 2019-08-11: 40 ug/min via INTRAVENOUS

## 2019-08-11 MED ORDER — SUGAMMADEX SODIUM 200 MG/2ML IV SOLN
INTRAVENOUS | Status: DC | PRN
  Administered 2019-08-11: 400 mg via INTRAVENOUS

## 2019-08-11 MED ORDER — SODIUM CHLORIDE 0.9 % IV SOLN
INTRAVENOUS | Status: DC | PRN
  Administered 2019-08-11: 80 mL

## 2019-08-11 MED ORDER — ROCURONIUM BROMIDE 100 MG/10ML IV SOLN
INTRAVENOUS | Status: DC | PRN
  Administered 2019-08-11: 80 mg via INTRAVENOUS

## 2019-08-11 SURGICAL SUPPLY — 14 items

## 2019-08-11 NOTE — Discharge Instructions (Signed)
YOU HAD AN ENDOSCOPIC PROCEDURE TODAY: Refer to the procedure report and other information in the discharge instructions given to you for any specific questions about what was found during the examination. If this information does not answer your questions, please call [place] office at [phone] to clarify.   YOU SHOULD EXPECT: Some feelings of bloating in the abdomen. Passage of more gas than usual. Walking can help get rid of the air that was put into your GI tract during the procedure and reduce the bloating. If you had a lower endoscopy (such as a colonoscopy or flexible sigmoidoscopy) you may notice spotting of blood in your stool or on the toilet paper. Some abdominal soreness may be present for a day or two, also.  DIET: Your first meal following the procedure should be a light meal and then it is ok to progress to your normal diet. A half-sandwich or bowl of soup is an example of a good first meal. Heavy or fried foods are harder to digest and may make you feel nauseous or bloated. Drink plenty of fluids but you should avoid alcoholic beverages for 24 hours. If you had a esophageal dilation, please see attached instructions for diet.    ACTIVITY: Your care partner should take you home directly after the procedure. You should plan to take it easy, moving slowly for the rest of the day. You can resume normal activity the day after the procedure however YOU SHOULD NOT DRIVE, use power tools, machinery or perform tasks that involve climbing or major physical exertion for 24 hours (because of the sedation medicines used during the test).   SYMPTOMS TO REPORT IMMEDIATELY: A gastroenterologist can be reached at any hour. Please call [phone]  for any of the following symptoms:   Following upper endoscopy (EGD, EUS, ERCP, esophageal dilation) Vomiting of blood or coffee ground material  New, significant abdominal pain  New, significant chest pain or pain under the shoulder blades  Painful or  persistently difficult swallowing  New shortness of breath  Black, tarry-looking or red, bloody stools  FOLLOW UP:  If any biopsies were taken you will be contacted by phone or by letter within the next 1-3 weeks. Call [phone]  if you have not heard about the biopsies in 3 weeks.  Please also call with any specific questions about appointments or follow up tests.  

## 2019-08-11 NOTE — Anesthesia Postprocedure Evaluation (Signed)
Anesthesia Post Note  Patient: Johnathan Keith  Procedure(s) Performed: ESOPHAGOGASTRODUODENOSCOPY (EGD) WITH PROPOFOL (N/A ) ENDOSCOPIC RETROGRADE CHOLANGIOPANCREATOGRAPHY (ERCP) WITH PROPOFOL (N/A ) BIOPSY SAVORY DILATION (N/A ) SPHINCTEROTOMY REMOVAL OF STONES BILIARY DILATION     Patient location during evaluation: PACU Anesthesia Type: General Level of consciousness: awake and alert Pain management: pain level controlled Vital Signs Assessment: post-procedure vital signs reviewed and stable Respiratory status: spontaneous breathing, nonlabored ventilation, respiratory function stable and patient connected to nasal cannula oxygen Cardiovascular status: blood pressure returned to baseline and stable Postop Assessment: no apparent nausea or vomiting Anesthetic complications: no    Last Vitals:  Vitals:   08/11/19 1021 08/11/19 1031  BP: (!) 122/57 (!) 132/57  Pulse: 68 70  Resp: 18 19  Temp:    SpO2: 100% 99%    Last Pain:  Vitals:   08/11/19 1005  TempSrc: Temporal  PainSc:                  Danyka Merlin,W. EDMOND

## 2019-08-11 NOTE — Op Note (Addendum)
[facility] Patient Name: Johnathan Keith Procedure Date : 08/11/2019 MRN: [id number] Attending MD: Corliss Parish , MD Date of Birth: 05-20-1961 CSN: [id number] Age: 58 Admit Type: Inpatient Procedure:                Upper GI endoscopy Indications:              Dysphagia Providers:                Corliss Parish, MD, Zoe Lan, RN, Brion Aliment, Technician Referring MD:             Lynann Bologna, MD, Mary Sella. Andrey Campanile MD, MD, Shelle Iron Medicines:                General Anesthesia Complications:            No immediate complications. Estimated Blood Loss:     Estimated blood loss was minimal. Procedure:                Pre-Anesthesia Assessment:                           - Prior to the procedure, a History and Physical                            was performed, and patient medications and                            allergies were reviewed. The patient's tolerance of                            previous anesthesia was also reviewed. The risks                            and benefits of the procedure and the sedation                            options and risks were discussed with the patient.                            All questions were answered, and informed consent                            was obtained. Prior Anticoagulants: The patient has                            taken Plavix (clopidogrel), last dose was 6 days                            prior to procedure. ASA Grade Assessment: III - A  patient with severe systemic disease. After                            reviewing the risks and benefits, the patient was                            deemed in satisfactory condition to undergo the                            procedure.                           After obtaining informed consent, the endoscope was                            passed under direct vision. Throughout the              procedure, the patient's blood pressure, pulse, and                            oxygen saturations were monitored continuously. The                            GIF-H190 ([id number]) Olympus gastroscope was                            introduced through the mouth, and advanced to the                            duodenal bulb. The upper GI endoscopy was                            accomplished without difficulty. The patient                            tolerated the procedure. Scope In: Scope Out: Findings:      No gross mucosal lesions were noted in the entire esophagus.      The distal esophagus was mildly tortuous.      The Z-line was regular and was found 39 cm from the incisors.      A guidewire was placed and the scope was withdrawn. Dilation was       performed in the entire esophagus with a Savary dilator with no       resistance at 15 mm and mild resistance at 16 mm and 17 mm. The dilation       site was examined following endoscope reinsertion and showed no change.      A large amount of food (residue) was found in the gastric body.      Multiple small sessile polyps were found in the gastric body - likely       fundic gland polyps. Biopsies were taken with a cold forceps for       histology.      Scattered moderate inflammation characterized by erythema and friability       was found in the gastric body and in the gastric antrum. Biopsies were       taken  with a cold forceps for histology and Helicobacter pylori testing.      No gross lesions were noted in the duodenal bulb, in the first portion       of the duodenum, in the second portion of the duodenum and in the major       papilla. Impression:               - No gross mucosal lesions in esophagus. Tortuous                            esophagus distally (mild). Z-line regular, 39 cm                            from the incisors. Savary dilation performed in the                            entire esophagus.                            - A large amount of food (residue) in the stomach.                           - Multiple gastric polyps - likely fundic gland.                            Biopsied.                           - Gastritis. Biopsied.                           - No gross lesions in the duodenal bulb, in the                            first portion of the duodenum, in the second                            portion of the duodenum and in the major papilla. Recommendation:           - Proceed to scheduled ERCP attempt.                           - If symptoms of dypshagia persist, then                            consideration of Manometry per Dr. Lyndel Safe will be                            made.                           - Continue Protonix daily for now, if evidence of                            H. pylori or significant gastritis, will discuss  with Dr. Chales AbrahamsGupta consideration of twice daily PPI                            (have to be cognisecent of C. difficile risk with                            higher dose PPI).                           - Consideration of gastric emptying study based on                            patient's symptoms in the future based on large                            bezoar and foodstuffs in stomach this AM.                           - Plavix to be discussed on ERCP notation.                           - The findings and recommendations were discussed                            with the patient.                           - The findings and recommendations were discussed                            with the patient's family. Procedure Code(s):        --- Professional ---                           [phone], Esophagogastroduodenoscopy, flexible,                            transoral; with insertion of guide wire followed by                            passage of dilator(s) through esophagus over guide                            wire Diagnosis Code(s):        ---  Professional ---                           Q39.9, Congenital malformation of esophagus,                            unspecified                           K31.7, Polyp of stomach and duodenum  K29.70, Gastritis, unspecified, without bleeding                           R13.10, Dysphagia, unspecified CPT copyright 2019 American Medical Association. All rights reserved. The codes documented in this report are preliminary and upon coder review may  be revised to meet current compliance requirements. Corliss ParishGabriel Mansouraty, MD 08/11/2019 10:17:44 AM Number of Addenda: 0

## 2019-08-11 NOTE — Transfer of Care (Signed)
Immediate Anesthesia Transfer of Care Note  Patient: Johnathan Keith  Procedure(s) Performed: ESOPHAGOGASTRODUODENOSCOPY (EGD) WITH PROPOFOL (N/A ) ENDOSCOPIC RETROGRADE CHOLANGIOPANCREATOGRAPHY (ERCP) WITH PROPOFOL (N/A ) BIOPSY SAVORY DILATION (N/A ) SPHINCTEROTOMY REMOVAL OF STONES BILIARY DILATION  Patient Location: PACU and Endoscopy Unit  Anesthesia Type:General  Level of Consciousness: drowsy  Airway & Oxygen Therapy: Patient Spontanous Breathing and Patient connected to nasal cannula oxygen  Post-op Assessment: Report given to RN, Post -op Vital signs reviewed and stable and Patient moving all extremities  Post vital signs: Reviewed and stable  Last Vitals:  Vitals Value Taken Time  BP 130/56 08/11/19 1005  Temp 36.2 C 08/11/19 1005  Pulse 68 08/11/19 1010  Resp 14 08/11/19 1010  SpO2 99 % 08/11/19 1010  Vitals shown include unvalidated device data.  Last Pain:  Vitals:   08/11/19 1005  TempSrc: Temporal  PainSc:          Complications: No apparent anesthesia complications

## 2019-08-11 NOTE — H&P (Signed)
GASTROENTEROLOGY PROCEDURE H&P NOTE   Primary Care Physician: Charlotte Sanes, MD  HPI: Johnathan Keith is a 58 y.o. male who presents for EGD/ERCP and possible EUS.  Past Medical History:  Diagnosis Date  . Arthritis   . Chronic neck and back pain   . Coronary artery disease    drug-eluting stents placed in proximal and mid LAD  . Deviated septum   . Diabetic retinopathy ([place])   . GERD (gastroesophageal reflux disease)    "occasionally" (03/23/2015)  . Headache    "more than 2/wk" (03/23/2015)  . Heart attack ([place])    "in 1997 was told I had signs of a small heart attack that I didn't know I'd had"  . High cholesterol   . History of gout    "when I was younger"  . History of kidney stones    passed  . Hypertension   . Kidney disease   . Migraine    "had my 1st and only in 12/2014" (03/23/2015)  . Peripheral vascular disease ([place])    carotid artery disease  . PONV (postoperative nausea and vomiting) 2015   "was told I was confused and combative in recovery from the narcotics w/my carotid OR"  "During a colonoscopy my oxygen level dropped so low that I had to be woke up.- 03/2017 colonoscopy [facility] [place]   . Sleep apnea    "severe; couldn't afford mask" (03/23/2015)  . Stroke [facility]) 2014   "on walker for 2 months; left side is weaker and numb since" (03/23/2015), "2 confirmed strokes"  . Type II diabetes mellitus ([place])    Past Surgical History:  Procedure Laterality Date  . CIRCUMCISION  1981  . COLONOSCOPY W/ POLYPECTOMY    . CORONARY ANGIOPLASTY WITH STENT PLACEMENT  03/23/2015   "2"  . CORONARY STENT INTERVENTION N/A 09/29/2017   Procedure: CORONARY STENT INTERVENTION;  Surgeon: Lorretta Harp, MD;  Location: [place] [facility];  Service: Cardiovascular;  Laterality: N/A;  . ENDARTERECTOMY Right 09/26/2014   Procedure: RIGHT CAROTID ENDARTERECTOMY WITH PATCH ANGIOPLASTY;  Surgeon: Conrad [place], MD;  Location: [place];  Service: Vascular;  Laterality: Right;  . EYE  SURGERY Right May 2016   Cataract  . EYE SURGERY Left July 2016   Cataract  . HYDROCELE EXCISION  ~ 2008  . IR EXCHANGE BILIARY DRAIN  07/21/2019  . IR PERC CHOLECYSTOSTOMY  05/11/2019  . IR RADIOLOGIST EVAL & MGMT  06/30/2019  . LEFT HEART CATH AND CORONARY ANGIOGRAPHY N/A 09/29/2017   Procedure: LEFT HEART CATH AND CORONARY ANGIOGRAPHY;  Surgeon: Lorretta Harp, MD;  Location: [place] [facility];  Service: Cardiovascular;  Laterality: N/A;  . LEFT HEART CATHETERIZATION WITH CORONARY ANGIOGRAM N/A 03/23/2015   Procedure: LEFT HEART CATHETERIZATION WITH CORONARY ANGIOGRAM;  Surgeon: Lorretta Harp, MD;  Location: [facility] C[facility] LAB;  Service: Cardiovascular;  Laterality: N/A;  . REFRACTIVE SURGERY Bilateral 08/2014-09/2014   Diabetic retinopathy    Current Facility-Administered Medications  Medication Dose Route Frequency Provider Last Rate Last Dose  . 0.9 %  sodium chloride infusion   Intravenous Continuous Mansouraty, Telford Nab., MD       No Known Allergies Family History  Problem Relation Age of Onset  . Heart disease Mother   . Diabetes Mother   . Hyperlipidemia Mother   . Hypertension Mother   . Cancer Father        Lung Cancer   . Heart disease Father   . Heart attack Father   .  Gout Brother   . Asthma Brother   . Hyperlipidemia Brother   . Hypertension Brother   . Diabetes Brother   . Colon cancer Brother        colon  . Stroke Paternal Grandfather   . Colon cancer Sister   . Hyperlipidemia Sister   . Hypertension Sister   . Gout Maternal Uncle   . Diabetes Maternal Grandfather   . Diabetes Daughter    Social History   Socioeconomic History  . Marital status: Married    Spouse name: Not on file  . Number of children: 5  . Years of education: 12th  . Highest education level: Not on file  Occupational History  . Occupation: Disabled  Social Needs  . Financial resource strain: Not on file  . Food insecurity    Worry: Not on file    Inability: Not on file  .  Transportation needs    Medical: Not on file    Non-medical: Not on file  Tobacco Use  . Smoking status: Never Smoker  . Smokeless tobacco: Never Used  Substance and Sexual Activity  . Alcohol use: No    Alcohol/week: 0.0 standard drinks  . Drug use: No  . Sexual activity: Not Currently    Partners: Female  Lifestyle  . Physical activity    Days per week: Not on file    Minutes per session: Not on file  . Stress: Not on file  Relationships  . Social Musicianconnections    Talks on phone: Not on file    Gets together: Not on file    Attends religious service: Not on file    Active member of club or organization: Not on file    Attends meetings of clubs or organizations: Not on file    Relationship status: Not on file  . Intimate partner violence    Fear of current or ex partner: Not on file    Emotionally abused: Not on file    Physically abused: Not on file    Forced sexual activity: Not on file  Other Topics Concern  . Not on file  Social History Narrative   Patient is married with 5 children.    Patient is right handed.   Patient has hs education.   Patient drinks 4 12 oz can sodas daily.    Physical Exam: Vital signs in last 24 hours: Temp:  [98.4 F (36.9 C)] 98.4 F (36.9 C) (09/02 0740) Resp:  [14] 14 (09/02 0740) BP: (172)/(76) 172/76 (09/02 0740) SpO2:  [100 %] 100 % (09/02 0740) Weight:  [782[107 kg] 107 kg (09/02 0740)   GEN: NAD EYE: Sclerae anicteric ENT: MMM CV: Non-tachycardic GI: Soft, Percutaneous drain in place, TTP NEURO:  Alert & Oriented x 3  Lab Results: Recent Labs    08/09/19 1330  WBC 12.7*  HGB 12.8*  HCT 39.0  PLT 286.0   BMET Recent Labs    08/09/19 1330  NA 143  K 3.5  CL 108  CO2 22  GLUCOSE 191*  BUN 32*  CREATININE 1.21  CALCIUM 9.4   LFT Recent Labs    08/09/19 1330  PROT 7.5  ALBUMIN 4.2  AST 23  ALT 30  ALKPHOS 79  BILITOT 0.5   PT/INR No results for input(s): LABPROT, INR in the last 72 hours.    Impression / Plan: This is a 58 y.o.male who presents for EGD/ERCP with possible EUS.  The risks and benefits of endoscopic evaluation were discussed  with the patient; these include but are not limited to the risk of perforation, infection, bleeding, missed lesions, lack of diagnosis, severe illness requiring hospitalization, as well as anesthesia and sedation related illnesses.  The patient is agreeable to proceed.   The risks of EUS including bleeding, infection, aspiration pneumonia and intestinal perforation were discussed as was the possibility it may not give a definitive diagnosis.  If a biopsy of the pancreas is done as part of the EUS, there is an additional risk of pancreatitis at the rate of about 1%.  It was explained that procedure related pancreatitis is typically mild, although can be severe and even life threatening, which is why we do not perform random pancreatic biopsies and only biopsy a lesion we feel is concerning enough to warrant the risk.  The risks and benefits of endoscopic evaluation were discussed with the patient; these include but are not limited to the risk of perforation, infection, bleeding, missed lesions, lack of diagnosis, severe illness requiring hospitalization, as well as anesthesia and sedation related illnesses.  The patient is agreeable to proceed.    Corliss Parish, MD [facility] Office # [phone]

## 2019-08-11 NOTE — Op Note (Signed)
[facility] Patient Name: Johnathan Keith Procedure Date : 08/11/2019 MRN: [id number] Attending MD: Justice Britain , MD Date of Birth: February 05, 1961 CSN: [id number] Age: 57 Admit Type: Inpatient Procedure:                ERCP Indications:              Bile duct stone(s), Abnormal cholecystogram with                            findings of distal CBD stone Providers:                Justice Britain, MD, Carlyn Reichert, RN, Ladona Ridgel, Technician Referring MD:             Leighton Ruff. Redmond Pulling MD, MD, Jackquline Denmark, MD, Charlotte Sanes Medicines:                General Anesthesia Complications:            No immediate complications. Estimated Blood Loss:     Estimated blood loss was minimal. Procedure:                Pre-Anesthesia Assessment:                           - Prior to the procedure, a History and Physical                            was performed, and patient medications and                            allergies were reviewed. The patient's tolerance of                            previous anesthesia was also reviewed. The risks                            and benefits of the procedure and the sedation                            options and risks were discussed with the patient.                            All questions were answered, and informed consent                            was obtained. Prior Anticoagulants: The patient has                            taken Plavix (clopidogrel), last dose was 6 days  prior to procedure. ASA Grade Assessment: III - A                            patient with severe systemic disease. After                            reviewing the risks and benefits, the patient was                            deemed in satisfactory condition to undergo the                            procedure.                           - Prior to the procedure, a History and Physical                      was performed, and patient medications and                            allergies were reviewed. The patient's tolerance of                            previous anesthesia was also reviewed. The risks                            and benefits of the procedure and the sedation                            options and risks were discussed with the patient.                            All questions were answered, and informed consent                            was obtained. Prior Anticoagulants: The patient has                            taken Plavix (clopidogrel), last dose was 6 days                            prior to procedure. ASA Grade Assessment: III - A                            patient with severe systemic disease. After                            reviewing the risks and benefits, the patient was                            deemed in satisfactory condition to undergo the  procedure.                           After obtaining informed consent, the scope was                            passed under direct vision. Throughout the                            procedure, the patient's blood pressure, pulse, and                            oxygen saturations were monitored continuously. The                            TJF-Q180V ([id number] Olympus duodenoscope was                            introduced through the mouth, and used to inject                            contrast into and used to inject contrast into the                            bile duct. The ERCP was accomplished without                            difficulty. The patient tolerated the procedure. Scope In: Scope Out: Findings:      A scout film of the abdomen was obtained. One percutaneous drain ending       in the Right upper quadrant was seen.      The esophagus was successfully intubated under direct vision without       detailed examination of the pharynx, larynx, and associated structures,        and upper GI tract. The major papilla was normal.      A short 0.035 inch Soft Jagwire was passed into the biliary tree. The       short-nosed traction sphincterotome was passed over the guidewire and       the bile duct was then deeply cannulated. Contrast was injected. I       personally interpreted the bile duct images. Ductal flow of contrast was       adequate. Image quality was adequate. Contrast extended to the entire       biliary tree. Opacification of the entire biliary tree was successful.       Opacification of the gallbladder was successful. The lower third of the       main duct contained filling defect thought to be a stone. The main bile       duct was mildly dilated, with aforementioned filling defect/stone       causing an obstruction. The largest diameter was 10 mm. A 7 mm biliary       sphincterotomy was made with a monofilament Autotome sphincterotome       using ERBE electrocautery. There was no post-sphincterotomy bleeding.       Dilation of the distal common bile duct/ampullary orifice with an 07-17-09  mm balloon (to a maximum balloon size of 9 mm) dilator was successful as       a sphincteroplasty for a total of 4 minutes. To discover objects, the       biliary tree was swept with a retrieval balloon starting at the       bifurcation. One black pigmented stone was removed. No stones remained.       An occlusion cholangiogram was performed that showed no further       significant biliary pathology.      A pancreatogram was not performed.      The duodenoscope was withdrawn from the patient. Impression:               - The major papilla appeared normal.                           - A filling defect consistent with a stone was seen                            on the cholangiogram.                           - The entire main bile duct was mildly dilated,                            with a stone causing an obstruction.                           -  Choledocholithiasis was found. Complete removal                            was accomplished by biliary                            sphincterotomy/sphincteroplasty/balloon sweep.                           - Gallbladder filled with occlusion cholangiogram. Recommendation:           - The patient will be observed post-procedure,                            until all discharge criteria are met.                           - Discharge patient to home.                           - Patient has a contact number available for                            emergencies. The signs and symptoms of potential                            delayed complications were discussed with the  patient. Return to normal activities tomorrow.                            Written discharge instructions were provided to the                            patient.                           - Observe patient's clinical course.                           - Watch for pancreatitis, bleeding, perforation,                            and cholangitis.                           - Hold on restarting Plavix for 72 hours to                            decrease risk of                            post-sphincterotomy/sphincteroplasty bleeding.                           - Please monitor for signs/symptoms of infection                            including fevers/chills. No antibiotics were                            administered to patient because of his recent C.                            difficile infection to decrease risk of this                            recurring. But if symptoms develop will need to be                            prescribed antibiotics.                           - Check liver enzymes (AST, ALT, alkaline                            phosphatase, bilirubin) in 1 week.                           - Proceed with follow up with Dr. Redmond Pulling to discuss                            timing of surgical intervention for  cholecystectomy  for definitive treatment of his biliary issues.                           - The findings and recommendations were discussed                            with the patient.                           - The findings and recommendations were discussed                            with the patient's family. Procedure Code(s):        --- Professional ---                           [phone], Endoscopic retrograde                            cholangiopancreatography (ERCP); with removal of                            calculi/debris from biliary/pancreatic duct(s) Diagnosis Code(s):        --- Professional ---                           K80.51, Calculus of bile duct without cholangitis                            or cholecystitis with obstruction                           R93.2, Abnormal findings on diagnostic imaging of                            liver and biliary tract CPT copyright 2019 American [employer] Association. All rights reserved. The codes documented in this report are preliminary and upon coder review may  be revised to meet current compliance requirements. Justice Britain, MD 08/11/2019 10:26:45 AM Number of Addenda: 0

## 2019-08-11 NOTE — Anesthesia Procedure Notes (Signed)
Procedure Name: Intubation Date/Time: 08/11/2019 8:41 AM Performed by: Katrell Milhorn T, CRNA Pre-anesthesia Checklist: Patient identified, Emergency Drugs available, Suction available and Patient being monitored Patient Re-evaluated:Patient Re-evaluated prior to induction Oxygen Delivery Method: Circle system utilized Preoxygenation: Pre-oxygenation with 100% oxygen Induction Type: IV induction and Rapid sequence Laryngoscope Size: Mac and 4 Grade View: Grade II Tube type: Oral Tube size: 7.5 mm Number of attempts: 1 Airway Equipment and Method: Patient positioned with wedge pillow and Stylet Placement Confirmation: ETT inserted through vocal cords under direct vision,  positive ETCO2 and breath sounds checked- equal and bilateral Secured at: 23 cm Tube secured with: Tape Dental Injury: Teeth and Oropharynx as per pre-operative assessment

## 2019-08-12 ENCOUNTER — Telehealth: Payer: Self-pay | Admitting: Gastroenterology

## 2019-08-12 ENCOUNTER — Encounter (HOSPITAL_COMMUNITY): Payer: Self-pay | Admitting: Gastroenterology

## 2019-08-12 DIAGNOSIS — R0789 Other chest pain: Secondary | ICD-10-CM

## 2019-08-12 DIAGNOSIS — R1011 Right upper quadrant pain: Secondary | ICD-10-CM

## 2019-08-12 MED ORDER — DIPHENOXYLATE-ATROPINE 2.5-0.025 MG PO TABS: 1.0000 | ORAL_TABLET | Freq: Two times a day (BID) | ORAL | 3 refills | Status: DC | PRN

## 2019-08-12 NOTE — Telephone Encounter (Signed)
Thanks for taking care of him. He always would have diarrhea If he still has, I will have him follow-up in the office. Thx again for your help  RG

## 2019-08-12 NOTE — Telephone Encounter (Signed)
The patient called back this afternoon and I was able to talk with he and his wife. Since the patient has awoken this morning he was doing okay initially.  He was able to tolerate breakfast and into lunch he began to experience some discomfort in his right shoulder blade region as well as in his right flank.  No associated nausea or vomiting.  He is able to tolerate his lunch.  Since lunchtime however he has had significant amounts of loose watery bowel movements.  He denies any blood in the stools. He states that he is able to stay hydrated with half-and-half tea and may be able to drink Gatorade to stay hydrated. The patient cannot recall the type of pain he had when he presented to the hospital because he was comatose at the time of his admission back in July when he required the percutaneous cholecystostomy tube. The pretest probability of this patient having significant pancreatitis is low since we got into the biliary tree on single wire placement however it is never 0 risk. I have asked the patient and his wife that should the patient not be able to maintain hydration over the course of this evening that he should call us back and we will direct him to the emergency department to ensure he does not have pancreatitis and to ensure he gets pain control and IV fluids if necessary. If the patient's diarrhea persists then he will need additional stool testing. Pretest chance of having caused C. difficile to occur when he received no antibiotics is low but not 0 risk in the setting of anyone who has had C. difficile.  Review of the chart per Dr. 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 I will send Lomotil to his local pharmacy so that he can initiate that this evening. As per my prior notation to advanced RN Gerarda Fraction, we will plan on obtaining stool studies including C. difficile/stool culture/ova and parasite tomorrow (Friday). Additional laboratory work-up  will also need to be performed. The patient labs and stool studies will be in the system. Advanced RN Gerarda Fraction or covering RN will reach out to the patient tomorrow morning. The patient can go to the [facility] office to have his labs and stool cultures performed. If at any time point, the patient progresses or worsens or cannot tolerate oral intake he will go to the hospital for further evaluation and this is been communicated to both he and his wife.  I will send a message to on-call Dr. Bryan Lemma to be aware of my conversation.  Justice Britain, MD [facility] Office # [phone]

## 2019-08-12 NOTE — Telephone Encounter (Signed)
ERCP on 9/2 pt complains of sore throat, chest and abdomen sore.  Right shoulder and arm sore.  Watery diarrhea 5 episodes over the past 3 hours.  No fever.  A lot of bloating.  Please advise

## 2019-08-12 NOTE — Telephone Encounter (Signed)
Johnathan Keith, I tried calling the number in the chart on 3 occasions this afternoon. I was able to leave a voicemail. I await a call back it is currently [phone] and I have not heard from them. If the patient does not call back before the end of today, please reach out to them tomorrow. If he is still having significant issues then he needs to have a CBC/CMP/amylase/lipase and have stool studies performed in particular needs to have a C. difficile PCR performed as well as a stool culture and ova/parasite. I did not give him antibiotics at time of his ERCP because of my concern of potentially accentuating C. difficile infection so that should not be a result of our procedure yesterday. The patient could have a sore throat as a result of both the intubation as well as the endoscope as well as our dilation. However, there is always the risk of a complication from his procedure and so we need to be thoughtful. However, I would like the patient if he still having issues to have a neck x-ray, a chest x-ray PA/lateral as well as a KUB 3 view supine/upright/decubitus. If the patient continues to have significant issues then he needs to be evaluated at the emergency department. I will place the on-call provider on this message so that if the patient calls back after hours our on-call provider will be aware of our trial of reaching the patient and decide on next steps which may require emergency department evaluation if the patient is not stable or having progressive issues.   Johnathan Keith, please keep me up-to-date tomorrow if I do not update this message before. Thanks.  Justice Britain, MD

## 2019-08-13 ENCOUNTER — Other Ambulatory Visit (INDEPENDENT_AMBULATORY_CARE_PROVIDER_SITE_OTHER): Payer: Medicare HMO

## 2019-08-13 ENCOUNTER — Encounter: Payer: Self-pay | Admitting: Gastroenterology

## 2019-08-13 DIAGNOSIS — R0789 Other chest pain: Secondary | ICD-10-CM | POA: Diagnosis not present

## 2019-08-13 DIAGNOSIS — R1011 Right upper quadrant pain: Secondary | ICD-10-CM | POA: Diagnosis not present

## 2019-08-13 LAB — CBC
HCT: 38.5 % — ABNORMAL LOW (ref 39.0–52.0)
Hemoglobin: 12.6 g/dL — ABNORMAL LOW (ref 13.0–17.0)
MCHC: 32.7 g/dL (ref 30.0–36.0)
MCV: 90.5 fl (ref 78.0–100.0)
Platelets: 313 10*3/uL (ref 150.0–400.0)
RBC: 4.25 Mil/uL (ref 4.22–5.81)
RDW: 15.6 % — ABNORMAL HIGH (ref 11.5–15.5)
WBC: 10.9 10*3/uL — ABNORMAL HIGH (ref 4.0–10.5)

## 2019-08-13 LAB — AMYLASE: Amylase: 14 U/L — ABNORMAL LOW (ref 27–131)

## 2019-08-13 NOTE — Addendum Note (Signed)
Addended by: Isaiah Serge D on: 08/13/2019 12:45 PM   Modules accepted: Orders

## 2019-08-13 NOTE — Telephone Encounter (Signed)
The pt has been advised to come in for labs and has stated he will come to the [place] location.  He also states that he feels better this morning and is actually having PT right now.

## 2019-08-13 NOTE — Telephone Encounter (Signed)
Good to hear. GM 

## 2019-08-14 LAB — COMPLETE METABOLIC PANEL WITH GFR
AG Ratio: 1.5 (calc) (ref 1.0–2.5)
ALT: 43 U/L (ref 9–46)
AST: 47 U/L — ABNORMAL HIGH (ref 10–35)
Albumin: 4.2 g/dL (ref 3.6–5.1)
Alkaline phosphatase (APISO): 75 U/L (ref 35–144)
BUN: 23 mg/dL (ref 7–25)
CO2: 21 mmol/L (ref 20–32)
Calcium: 9.4 mg/dL (ref 8.6–10.3)
Chloride: 111 mmol/L — ABNORMAL HIGH (ref 98–110)
Creat: 1.09 mg/dL (ref 0.70–1.25)
GFR, Est African American: 85 mL/min/{1.73_m2} (ref >=60)
GFR, Est Non African American: 73 mL/min/{1.73_m2} (ref >=60)
Globulin: 2.8 g/dL (calc) (ref 1.9–3.7)
Glucose, Bld: 207 mg/dL — ABNORMAL HIGH (ref 65–99)
Potassium: 4.1 mmol/L (ref 3.5–5.3)
Sodium: 144 mmol/L (ref 135–146)
Total Bilirubin: 0.5 mg/dL (ref 0.2–1.2)
Total Protein: 7 g/dL (ref 6.1–8.1)

## 2019-08-17 ENCOUNTER — Other Ambulatory Visit: Payer: Medicare HMO

## 2019-08-17 DIAGNOSIS — R0789 Other chest pain: Secondary | ICD-10-CM

## 2019-08-17 DIAGNOSIS — R1011 Right upper quadrant pain: Secondary | ICD-10-CM

## 2019-08-19 LAB — CLOSTRIDIUM DIFFICILE EIA: C difficile Toxins A+B, EIA: NEGATIVE

## 2019-08-21 LAB — STOOL CULTURE
MICRO NUMBER:: 855494
MICRO NUMBER:: 855495
MICRO NUMBER:: 855497
SHIGA RESULT:: NOT DETECTED
SPECIMEN QUALITY:: ADEQUATE
SPECIMEN QUALITY:: ADEQUATE
SPECIMEN QUALITY:: ADEQUATE

## 2019-08-21 LAB — OVA AND PARASITE EXAMINATION
CONCENTRATE RESULT:: NONE SEEN
MICRO NUMBER:: 855496
SPECIMEN QUALITY:: ADEQUATE
TRICHROME RESULT:: NONE SEEN

## 2019-08-27 ENCOUNTER — Other Ambulatory Visit (HOSPITAL_COMMUNITY): Payer: Medicare HMO

## 2019-08-27 ENCOUNTER — Other Ambulatory Visit: Payer: Self-pay

## 2019-08-27 ENCOUNTER — Encounter (HOSPITAL_COMMUNITY): Payer: Self-pay

## 2019-08-27 ENCOUNTER — Encounter (HOSPITAL_COMMUNITY)
Admission: RE | Admit: 2019-08-27 | Discharge: 2019-08-27 | Disposition: A | Discharge status: Home / Self Care | Payer: Medicare HMO | Source: Ambulatory Visit | Attending: General Surgery | Admitting: General Surgery

## 2019-08-27 ENCOUNTER — Other Ambulatory Visit (HOSPITAL_COMMUNITY)
Admission: RE | Admit: 2019-08-27 | Discharge: 2019-08-27 | Disposition: A | Discharge status: Home / Self Care | Payer: Medicare HMO | Source: Ambulatory Visit | Attending: General Surgery | Admitting: General Surgery

## 2019-08-27 ENCOUNTER — Ambulatory Visit: Payer: Self-pay | Admitting: General Surgery

## 2019-08-27 DIAGNOSIS — E11319 Type 2 diabetes mellitus with unspecified diabetic retinopathy without macular edema: Secondary | ICD-10-CM | POA: Diagnosis not present

## 2019-08-27 DIAGNOSIS — Z8673 Personal history of transient ischemic attack (TIA), and cerebral infarction without residual deficits: Secondary | ICD-10-CM | POA: Insufficient documentation

## 2019-08-27 DIAGNOSIS — Z7982 Long term (current) use of aspirin: Secondary | ICD-10-CM | POA: Insufficient documentation

## 2019-08-27 DIAGNOSIS — M1990 Unspecified osteoarthritis, unspecified site: Secondary | ICD-10-CM | POA: Diagnosis not present

## 2019-08-27 DIAGNOSIS — I10 Essential (primary) hypertension: Secondary | ICD-10-CM | POA: Diagnosis not present

## 2019-08-27 DIAGNOSIS — G4730 Sleep apnea, unspecified: Secondary | ICD-10-CM | POA: Insufficient documentation

## 2019-08-27 DIAGNOSIS — E1151 Type 2 diabetes mellitus with diabetic peripheral angiopathy without gangrene: Secondary | ICD-10-CM | POA: Insufficient documentation

## 2019-08-27 DIAGNOSIS — Z20828 Contact with and (suspected) exposure to other viral communicable diseases: Secondary | ICD-10-CM | POA: Insufficient documentation

## 2019-08-27 DIAGNOSIS — Z79899 Other long term (current) drug therapy: Secondary | ICD-10-CM | POA: Insufficient documentation

## 2019-08-27 DIAGNOSIS — K811 Chronic cholecystitis: Secondary | ICD-10-CM | POA: Insufficient documentation

## 2019-08-27 DIAGNOSIS — E7800 Pure hypercholesterolemia, unspecified: Secondary | ICD-10-CM | POA: Diagnosis not present

## 2019-08-27 DIAGNOSIS — K219 Gastro-esophageal reflux disease without esophagitis: Secondary | ICD-10-CM | POA: Insufficient documentation

## 2019-08-27 DIAGNOSIS — G8929 Other chronic pain: Secondary | ICD-10-CM | POA: Insufficient documentation

## 2019-08-27 DIAGNOSIS — Z794 Long term (current) use of insulin: Secondary | ICD-10-CM | POA: Insufficient documentation

## 2019-08-27 DIAGNOSIS — I252 Old myocardial infarction: Secondary | ICD-10-CM | POA: Insufficient documentation

## 2019-08-27 DIAGNOSIS — Z01812 Encounter for preprocedural laboratory examination: Secondary | ICD-10-CM | POA: Diagnosis present

## 2019-08-27 DIAGNOSIS — I2510 Atherosclerotic heart disease of native coronary artery without angina pectoris: Secondary | ICD-10-CM | POA: Diagnosis not present

## 2019-08-27 DIAGNOSIS — Z7902 Long term (current) use of antithrombotics/antiplatelets: Secondary | ICD-10-CM | POA: Insufficient documentation

## 2019-08-27 HISTORY — DX: Polyneuropathy, unspecified: G62.9

## 2019-08-27 HISTORY — DX: Pneumonia, unspecified organism: J18.9

## 2019-08-27 HISTORY — DX: Enterocolitis due to Clostridium difficile, not specified as recurrent: A04.72

## 2019-08-27 LAB — COMPREHENSIVE METABOLIC PANEL
ALT: 48 U/L — ABNORMAL HIGH (ref 0–44)
AST: 35 U/L (ref 15–41)
Albumin: 3.9 g/dL (ref 3.5–5.0)
Alkaline Phosphatase: 86 U/L (ref 38–126)
Anion gap: 12 (ref 5–15)
BUN: 21 mg/dL — ABNORMAL HIGH (ref 6–20)
CO2: 25 mmol/L (ref 22–32)
Calcium: 8.9 mg/dL (ref 8.9–10.3)
Chloride: 106 mmol/L (ref 98–111)
Creatinine, Ser: 0.94 mg/dL (ref 0.61–1.24)
GFR calc Af Amer: >60 mL/min (ref >60)
GFR calc non Af Amer: >60 mL/min (ref >60)
Glucose, Bld: 233 mg/dL — ABNORMAL HIGH (ref 70–99)
Potassium: 4.2 mmol/L (ref 3.5–5.1)
Sodium: 143 mmol/L (ref 135–145)
Total Bilirubin: 0.5 mg/dL (ref 0.3–1.2)
Total Protein: 7.4 g/dL (ref 6.5–8.1)

## 2019-08-27 LAB — CBC WITH DIFFERENTIAL/PLATELET
Abs Immature Granulocytes: 0.06 10*3/uL (ref 0.00–0.07)
Basophils Absolute: 0 10*3/uL (ref 0.0–0.1)
Basophils Relative: 0 %
Eosinophils Absolute: 0.4 10*3/uL (ref 0.0–0.5)
Eosinophils Relative: 3 %
HCT: 39.6 % (ref 39.0–52.0)
Hemoglobin: 12.3 g/dL — ABNORMAL LOW (ref 13.0–17.0)
Immature Granulocytes: 1 %
Lymphocytes Relative: 19 %
Lymphs Abs: 2.2 10*3/uL (ref 0.7–4.0)
MCH: 30.1 pg (ref 26.0–34.0)
MCHC: 31.1 g/dL (ref 30.0–36.0)
MCV: 96.8 fL (ref 80.0–100.0)
Monocytes Absolute: 0.9 10*3/uL (ref 0.1–1.0)
Monocytes Relative: 8 %
Neutro Abs: 7.8 10*3/uL — ABNORMAL HIGH (ref 1.7–7.7)
Neutrophils Relative %: 69 %
Platelets: 270 10*3/uL (ref 150–400)
RBC: 4.09 MIL/uL — ABNORMAL LOW (ref 4.22–5.81)
RDW: 14.4 % (ref 11.5–15.5)
WBC: 11.4 10*3/uL — ABNORMAL HIGH (ref 4.0–10.5)
nRBC: 0 % (ref 0.0–0.2)

## 2019-08-27 LAB — HEMOGLOBIN A1C
Hgb A1c MFr Bld: 8.5 % — ABNORMAL HIGH (ref 4.8–5.6)
Mean Plasma Glucose: 197.25 mg/dL

## 2019-08-27 LAB — GLUCOSE, CAPILLARY: Glucose-Capillary: 206 mg/dL — ABNORMAL HIGH (ref 70–99)

## 2019-08-27 NOTE — Patient Instructions (Signed)
DUE TO COVID-19 ONLY ONE VISITOR IS ALLOWED TO COME WITH YOU AND STAY IN THE WAITING ROOM ONLY DURING PRE OP AND PROCEDURE DAY OF SURGERY. THE 1 VISITOR MAY VISIT WITH YOU AFTER SURGERY IN YOUR PRIVATE ROOM DURING VISITING HOURS ONLY!  YOU NEED TO HAVE A COVID 19 TEST ON_______ @_______ , THIS TEST MUST BE DONE BEFORE SURGERY, COME  [address], [place] , [address].  [facility]) ONCE YOUR COVID TEST IS COMPLETED,  PLEASE BEGIN THE QUARANTINE INSTRUCTIONS AS OUTLINED IN YOUR HANDOUT.                Erique Christison    Your procedure is scheduled on: 08/31/19   Report to [facility] Main  Entrance   Report to Short Stay at   5:30 AM     Call this number if you have problems the morning of surgery [phone]     BRUSH YOUR TEETH MORNING OF SURGERY AND RINSE YOUR MOUTH OUT, NO CHEWING GUM CANDY OR MINTS.   Do not eat food After Midnight.   YOU MAY HAVE CLEAR LIQUIDS FROM MIDNIGHT UNTIL 4:30AM  . At 4:30AM Please finish the prescribed Pre-Surgery Gatorade drink.   Nothing by mouth after you finish the Gatorade drink !    Take these medicines the morning of surgery with A SIP OF WATER: Metoprolol, Protonix  How to Manage Your Diabetes Before and After Surgery  Why is it important to control my blood sugar before and after surgery? . Improving blood sugar levels before and after surgery helps healing and can limit problems. . A way of improving blood sugar control is eating a healthy diet by: o  Eating less sugar and carbohydrates o  Increasing activity/exercise o  Talking with your doctor about reaching your blood sugar goals . High blood sugars (greater than 180 mg/dL) can raise your risk of infections and slow your recovery, so you will need to focus on controlling your diabetes during the weeks before surgery. . Make sure that the doctor who takes care of your diabetes knows about your planned surgery including the date and location.  How do I  manage my blood sugar before surgery? . Check your blood sugar at least 4 times a day, starting 2 days before surgery, to make sure that the level is not too high or low. o Check your blood sugar the morning of your surgery when you wake up and every 2 hours until you get to the Short Stay unit. . If your blood sugar is less than 70 mg/dL, you will need to treat for low blood sugar: o Do not take insulin. o Treat a low blood sugar (less than 70 mg/dL) with  cup of clear juice (cranberry or apple), 4 glucose tablets, OR glucose gel. o Recheck blood sugar in 15 minutes after treatment (to make sure it is greater than 70 mg/dL). If your blood sugar is not greater than 70 mg/dL on recheck, call [phone] for further instructions. . Report your blood sugar to the short stay nurse when you get to Short Stay.  . If you are admitted to the hospital after surgery: o Your blood sugar will be checked by the staff and you will probably be given insulin after surgery (instead of oral diabetes medicines) to make sure you have good blood sugar levels. o The goal for blood sugar control after surgery is 80-180 mg/dL.   WHAT DO I DO ABOUT MY DIABETES MEDICATION?  Marland Kitchen  Do not take oral diabetes medicines (pills) the morning of surgery. .   . THE NIGHT BEFORE SURGERY, take     50%   units of   insulin.       . THE MORNING OF SURGERY, take 0   units of   insulin.  .   . If your CBG is greater than 220 mg/dL, you may take  of your sliding scale  . (correction) dose of insulin.        DO NOT TAKE ANY DIABETIC MEDICATIONS DAY OF YOUR SURGERY                               You may not have any metal on your body including hair pins and              piercings             Do not wear jewelry,  lotions, powders or  deodorant                         Men may shave face and neck.   Do not bring valuables to the hospital. [place].  Contacts, dentures or  bridgework may not be worn into surgery.      Name and phone number of your driver:  Special Instructions: N/A              Please read over the following fact sheets you were given: _____________________________________________________________________             [facility] - Preparing for Surgery  Before surgery, you can play an important role.   Because skin is not sterile, your skin needs to be as free of germs as possible.   You can reduce the number of germs on your skin by washing with CHG (chlorahexidine gluconate) soap before surgery.   CHG is an antiseptic cleaner which kills germs and bonds with the skin to continue killing germs even after washing. Please DO NOT use if you have an allergy to CHG or antibacterial soaps.   If your skin becomes reddened/irritated stop using the CHG and inform your nurse when you arrive at Short Stay. r.  You may shave your face/neck.  Please follow these instructions carefully:  1.  Shower with CHG Soap the night before surgery and the  morning of Surgery.  2.  If you choose to wash your hair, wash your hair first as usual with your  normal  shampoo.  3.  After you shampoo, rinse your hair and body thoroughly to remove the  shampoo.                                        4.  Use CHG as you would any other liquid soap.  You can apply chg directly  to the skin and wash                       Gently with a scrungie or clean washcloth.  5.  Apply the CHG Soap to your body ONLY FROM THE NECK DOWN.   Do not use on face/ open  Wound or open sores. Avoid contact with eyes, ears mouth and genitals (private parts).                       Wash face,  Genitals (private parts) with your normal soap.             6.  Wash thoroughly, paying special attention to the area where your surgery  will be performed.  7.  Thoroughly rinse your body with warm water from the neck down.  8.  DO NOT shower/wash with your normal soap after using  and rinsing off  the CHG Soap.             9.  Pat yourself dry with a clean towel.            10.  Wear clean pajamas.            11.  Place clean sheets on your bed the night of your first shower and do not  sleep with pets.  Day of Surgery : Do not apply any lotions/deodorants the morning of surgery.  Please wear clean clothes to the hospital/surgery center.   FAILURE TO FOLLOW THESE INSTRUCTIONS MAY RESULT IN THE CANCELLATION OF YOUR SURGERY PATIENT SIGNATURE_________________________________  NURSE SIGNATURE__________________________________  ________________________________________________________________________

## 2019-08-28 LAB — NOVEL CORONAVIRUS, NAA (HOSP ORDER, SEND-OUT TO REF LAB; TAT 18-24 HRS): SARS-CoV-2, NAA: NOT DETECTED

## 2019-08-30 NOTE — Anesthesia Preprocedure Evaluation (Addendum)
Anesthesia Evaluation  Patient identified by MRN, date of birth, ID band Patient awake    Reviewed: Allergy & Precautions, NPO status , Patient's Chart, lab work & pertinent test results  History of Anesthesia Complications (+) PONV  Airway Mallampati: II  TM Distance: >3 FB Neck ROM: Full    Dental no notable dental hx.    Pulmonary sleep apnea ,    Pulmonary exam normal breath sounds clear to auscultation       Cardiovascular hypertension, Pt. on medications + CAD, + Past MI, + Cardiac Stents and + Peripheral Vascular Disease  Normal cardiovascular exam Rhythm:Regular Rate:Normal     Neuro/Psych CVA, Residual Symptoms negative psych ROS   GI/Hepatic negative GI ROS, Neg liver ROS,   Endo/Other  diabetes, Type 2, Insulin Dependent, Oral Hypoglycemic Agents  Renal/GU negative Renal ROS  negative genitourinary   Musculoskeletal negative musculoskeletal ROS (+)   Abdominal   Peds negative pediatric ROS (+)  Hematology negative hematology ROS (+)   Anesthesia Other Findings   Reproductive/Obstetrics negative OB ROS                                                             Anesthesia Evaluation  Patient identified by MRN, date of birth, ID band Patient awake    Reviewed: Allergy & Precautions, H&P , NPO status , Patient's Chart, lab work & pertinent test results, reviewed documented beta blocker date and time   History of Anesthesia Complications (+) PONV  Airway Mallampati: II  TM Distance: >3 FB Neck ROM: Full    Dental no notable dental hx. (+) Poor Dentition, Dental Advisory Given   Pulmonary neg pulmonary ROS, sleep apnea and Oxygen sleep apnea ,    Pulmonary exam normal breath sounds clear to auscultation       Cardiovascular Exercise Tolerance: Good hypertension, Pt. on medications and Pt. on home beta blockers + CAD, + Past MI, + Cardiac Stents and +  Peripheral Vascular Disease  negative cardio ROS   Rhythm:Regular Rate:Normal     Neuro/Psych  Headaches, CVA, Residual Symptoms negative neurological ROS  negative psych ROS   GI/Hepatic negative GI ROS, Neg liver ROS, GERD  Medicated,  Endo/Other  diabetes, Insulin DependentMorbid obesity  Renal/GU negative Renal ROS  negative genitourinary   Musculoskeletal  (+) Arthritis , Osteoarthritis,    Abdominal   Peds  Hematology negative hematology ROS (+)   Anesthesia Other Findings   Reproductive/Obstetrics negative OB ROS                          Anesthesia Physical Anesthesia Plan  ASA: IV  Anesthesia Plan: General   Post-op Pain Management:    Induction: Intravenous  PONV Risk Score and Plan: 3 and Ondansetron, Midazolam and Treatment may vary due to age or medical condition  Airway Management Planned: Oral ETT  Additional Equipment:   Intra-op Plan:   Post-operative Plan: Extubation in OR  Informed Consent: I have reviewed the patients History and Physical, chart, labs and discussed the procedure including the risks, benefits and alternatives for the proposed anesthesia with the patient or authorized representative who has indicated his/her understanding and acceptance.     Dental advisory given  Plan Discussed with: CRNA  Anesthesia Plan Comments: (  PAT note written 08/10/2019 by Myra Gianotti, PA-C. SAME DAY WORK-UP   )      Anesthesia Quick Evaluation  Anesthesia Physical Anesthesia Plan  ASA: III  Anesthesia Plan: General   Post-op Pain Management:    Induction: Intravenous  PONV Risk Score and Plan: 4 or greater and Ondansetron, Dexamethasone, Midazolam and Treatment may vary due to age or medical condition  Airway Management Planned: Oral ETT  Additional Equipment:   Intra-op Plan:   Post-operative Plan: Extubation in OR  Informed Consent: I have reviewed the patients History and Physical, chart,  labs and discussed the procedure including the risks, benefits and alternatives for the proposed anesthesia with the patient or authorized representative who has indicated his/her understanding and acceptance.     Dental advisory given  Plan Discussed with: CRNA  Anesthesia Plan Comments:        Anesthesia Quick Evaluation

## 2019-08-30 NOTE — Progress Notes (Signed)
Anesthesia Chart Review   Case: [id number] Date/Time: 08/31/19 0715   Procedure: LAPAROSCOPIC CHOLECYSTECTOMY WITH POSSIBLE INTRAOPERATAIVE CHOLANGIOGRAM (N/A )   Anesthesia type: General   Pre-op diagnosis: CHRONIC CHOLECYSTITIS S/P PERC DRAIN   Location: [facility] ROOM 01 / [facility]   Surgeon: Gaynelle AduWilson, Eric, MD      DISCUSSION:58 y.o. never smoker with h/o PONV, HTN, PVD, DM II, CAD (DES to proximal and mid LAD), GERD, CVA residual mild aphasia, sleep apnea (unable to tolerate CPAP mask, uses 2L O2 at night), chronic cholecystitis s/p perc drain scheduled for above procedure 08/31/2019 with Dr. Gaynelle AduEric Wilson.   Admitted 05/07/19-05/17/19 for acute gallstone cholecystitis and ascending cholangitis s/p cholecystostomy tube 05/11/19 by IR. Also with severe sepsis due to Klebsiella bacteremia and bile culture growing      Enterococcus faecalis, s/p Unasyn then Augmentin at discharge. Finished vancomycin for C. Difficile colitis 07/18/19. S/p  EGD, ERCP, biliary stone removal 08/11/2019 with no anesthesia complications noted.    His cardiologist Dr. Wynonia HazardKhawaja gave permission to hold Plavix for 5 days prior to procedure (scanned under Media tab).  Patient had recent RHC showing normal right sided pressures. His notes also indicate a "negative" stress test 02/2019 (report requested).   Stable at PAT visit.  Anticipate pt can proceed with planned procedure barring acute status change and after evaluation DOS.  VS: Ht 5\' 6"  (1.676 m)   Wt 106 kg   BMI 37.72 kg/m   PROVIDERS: Shelle IronSistasis, [name], MD is PCP   Gwen HerKhawaja, Usman, MD is Cardiologist  LABS: Labs reviewed: Acceptable for surgery. (all labs ordered are listed, but only abnormal results are displayed)  Labs Reviewed  CBC WITH DIFFERENTIAL/PLATELET - Abnormal; Notable for the following components:      Result Value   WBC 11.4 (*)    RBC 4.09 (*)    Hemoglobin 12.3 (*)    Neutro Abs 7.8 (*)    All other components within normal limits  COMPREHENSIVE METABOLIC PANEL  - Abnormal; Notable for the following components:   Glucose, Bld 233 (*)    BUN 21 (*)    ALT 48 (*)    All other components within normal limits  HEMOGLOBIN A1C - Abnormal; Notable for the following components:   Hgb A1c MFr Bld 8.5 (*)    All other components within normal limits  GLUCOSE, CAPILLARY - Abnormal; Notable for the following components:   Glucose-Capillary 206 (*)    All other components within normal limits     IMAGES:   EKG: 05/09/2019 Rate 89 bpm Normal sinus rhythm  Normal ECG   CV: Echo 05/10/2019 IMPRESSIONS    1. The left ventricle has mild-moderately reduced systolic function, with an ejection fraction of 40-45%. The cavity size was normal. Left ventricular diastolic Doppler parameters are consistent with impaired relaxation. Left ventricular diffuse  hypokinesis.  2. The right ventricle has normal systolic function. The cavity was normal. There is no increase in right ventricular wall thickness.  3. Left atrial size was mildly dilated.  4. The aortic valve has an indeterminate number of cusps. Aortic valve regurgitation is mild by color flow Doppler.  5. The aortic valve appears functionally bicuspid.  6. The aortic root and ascending aorta are normal in size and structure.  7. The interatrial septum was not well visualized.  Cardiac Cath 09/29/2017  Prox LAD to Mid LAD lesion, 0 %stenosed.  Mid LAD to Dist LAD lesion, 0 %stenosed.  Dist LAD lesion, 80 %stenosed.  Post intervention, there  is a 5% residual stenosis.  A stent was successfully placed.  The left ventricular systolic function is normal.  LV end diastolic pressure is normal.  The left ventricular ejection fraction is 55-65% by visual estimate.  Myocardial Perfusion 09/09/2017  Nuclear stress EF: 60%. The left ventricular ejection fraction is normal (55-65%).  The study is normal.  This is a low risk study. Past Medical History:  Diagnosis Date  . Arthritis   . Chronic  neck and back pain   . Clostridium difficile diarrhea    negative culture 08/17/19  . Coronary artery disease    drug-eluting stents placed in proximal and mid LAD  . Deviated septum   . Diabetic retinopathy (HCC)   . GERD (gastroesophageal reflux disease)    "occasionally" (03/23/2015)  . Headache    "more than 2/wk" (03/23/2015)  . Heart attack (HCC)    "in 1997 was told I had signs of a small heart attack that I didn't know I'd had"  . High cholesterol   . History of gout    "when I was younger"  . History of kidney stones    passed  . Hypertension   . Kidney disease   . Migraine    "had my 1st and only in 12/2014" (03/23/2015)  . Peripheral neuropathy    feet  . Peripheral vascular disease (HCC)    carotid artery disease  . Pneumonia 11/19,1/20  . PONV (postoperative nausea and vomiting) 2015   "was told I was confused and combative in recovery from the narcotics w/my carotid OR"  "During a colonoscopy my oxygen level dropped so low that I had to be woke up.- 03/2017 colonoscopy [facility]   . Sleep apnea    "severe; couldn't afford mask" (03/23/2015) uses O2 at night.no C-pap  . Stroke [facility]) 2014   "on walker for 2 months; left side is weaker and numb since" (03/23/2015), "2 confirmed strokes"  . Stroke (HCC) 05/10/2019   Aphasia mild  . Type II diabetes mellitus (HCC)     Past Surgical History:  Procedure Laterality Date  . BILIARY DILATION  08/11/2019   Procedure: BILIARY DILATION;  Surgeon: Meridee Score Netty Starring., MD;  Location: [facility] ENDOSCOPY;  Service: Gastroenterology;;  . BIOPSY  08/11/2019   Procedure: BIOPSY;  Surgeon: Lemar Lofty., MD;  Location: [facility] ENDOSCOPY;  Service: Gastroenterology;;  . [name]  1981  . COLONOSCOPY W/ POLYPECTOMY    . CORONARY ANGIOPLASTY WITH STENT PLACEMENT  03/23/2015   "2"  . CORONARY STENT INTERVENTION N/A 09/29/2017   Procedure: CORONARY STENT INTERVENTION;  Surgeon: Runell Gess, MD;  Location: [facility] INVASIVE CV  LAB;  Service: Cardiovascular;  Laterality: N/A;  . ENDARTERECTOMY Right 09/26/2014   Procedure: RIGHT CAROTID ENDARTERECTOMY WITH PATCH ANGIOPLASTY;  Surgeon: Fransisco Hertz, MD;  Location: [facility] OR;  Service: Vascular;  Laterality: Right;  . ENDOSCOPIC RETROGRADE CHOLANGIOPANCREATOGRAPHY (ERCP) WITH PROPOFOL N/A 08/11/2019   Procedure: ENDOSCOPIC RETROGRADE CHOLANGIOPANCREATOGRAPHY (ERCP) WITH PROPOFOL;  Surgeon: Lemar Lofty., MD;  Location: [facility] ENDOSCOPY;  Service: Gastroenterology;  Laterality: N/A;  . ESOPHAGOGASTRODUODENOSCOPY (EGD) WITH PROPOFOL N/A 08/11/2019   Procedure: ESOPHAGOGASTRODUODENOSCOPY (EGD) WITH PROPOFOL;  Surgeon: Meridee Score Netty Starring., MD;  Location: [facility] ENDOSCOPY;  Service: Gastroenterology;  Laterality: N/A;  . EYE SURGERY Right May 2016   Cataract  . EYE SURGERY Left July 2016   Cataract  . HYDROCELE EXCISION  ~ 2008  . IR EXCHANGE BILIARY DRAIN  07/21/2019  . IR PERC CHOLECYSTOSTOMY  05/11/2019  .  IR RADIOLOGIST EVAL & MGMT  06/30/2019  . LEFT HEART CATH AND CORONARY ANGIOGRAPHY N/A 09/29/2017   Procedure: LEFT HEART CATH AND CORONARY ANGIOGRAPHY;  Surgeon: Lorretta Harp, MD;  Location: [place] CV LAB;  Service: Cardiovascular;  Laterality: N/A;  . LEFT HEART CATHETERIZATION WITH CORONARY ANGIOGRAM N/A 03/23/2015   Procedure: LEFT HEART CATHETERIZATION WITH CORONARY ANGIOGRAM;  Surgeon: Lorretta Harp, MD;  Location: [facility] CATH LAB;  Service: Cardiovascular;  Laterality: N/A;  . REFRACTIVE SURGERY Bilateral 08/2014-09/2014   Diabetic retinopathy   . REMOVAL OF STONES  08/11/2019   Procedure: REMOVAL OF STONES;  Surgeon: Rush Landmark Telford Nab., MD;  Location: [place];  Service: Gastroenterology;;  . Azzie Almas DILATION N/A 08/11/2019   Procedure: Azzie Almas DILATION;  Surgeon: Irving Copas., MD;  Location: [place];  Service: Gastroenterology;  Laterality: N/A;  . SPHINCTEROTOMY  08/11/2019   Procedure: SPHINCTEROTOMY;  Surgeon: Rush Landmark Telford Nab.,  MD;  Location: [place];  Service: Gastroenterology;;    MEDICATIONS: . albuterol (PROVENTIL HFA;VENTOLIN HFA) 108 (90 Base) MCG/ACT inhaler  . aspirin 81 MG tablet  . benazepril (LOTENSIN) 10 MG tablet  . cholecalciferol (VITAMIN D3) 25 MCG (1000 UT) tablet  . clopidogrel (PLAVIX) 75 MG tablet  . dicyclomine (BENTYL) 10 MG capsule  . diphenoxylate-atropine (LOMOTIL) 2.5-0.025 MG tablet  . gabapentin (NEURONTIN) 300 MG capsule  . glimepiride (AMARYL) 4 MG tablet  . insulin NPH Human (HUMULIN N,NOVOLIN N) 100 UNIT/ML injection  . insulin regular (NOVOLIN R) 100 units/mL injection  . ipratropium-albuterol (DUONEB) 0.5-2.5 (3) MG/3ML SOLN  . lipase/protease/amylase (CREON) 36000 UNITS CPEP capsule  . metoprolol succinate (TOPROL-XL) 25 MG 24 hr tablet  . montelukast (SINGULAIR) 10 MG tablet  . nitroGLYCERIN (NITROSTAT) 0.4 MG SL tablet  . oxybutynin (DITROPAN) 5 MG tablet  . pantoprazole (PROTONIX) 40 MG tablet  . Polyvinyl Alcohol-Povidone (REFRESH OP)  . pravastatin (PRAVACHOL) 20 MG tablet  . tamsulosin (FLOMAX) 0.4 MG CAPS capsule  . torsemide (DEMADEX) 20 MG tablet   No current facility-administered medications for this encounter.      Maia Plan [facility] Pre-Surgical Testing [phone] 08/30/19  2:04 PM

## 2019-08-31 ENCOUNTER — Ambulatory Visit (HOSPITAL_COMMUNITY): Payer: Medicare HMO | Admitting: Physician Assistant

## 2019-08-31 ENCOUNTER — Encounter (HOSPITAL_COMMUNITY)
Admission: RE | Disposition: A | Discharge status: Home / Self Care | Payer: Self-pay | Source: Home / Self Care | Attending: General Surgery

## 2019-08-31 ENCOUNTER — Ambulatory Visit (HOSPITAL_COMMUNITY)
Admission: RE | Admit: 2019-08-31 | Discharge: 2019-08-31 | Disposition: A | Discharge status: Home / Self Care | Payer: Medicare HMO | Attending: General Surgery | Admitting: General Surgery

## 2019-08-31 ENCOUNTER — Encounter (HOSPITAL_COMMUNITY): Payer: Self-pay | Admitting: *Deleted

## 2019-08-31 ENCOUNTER — Other Ambulatory Visit: Payer: Self-pay

## 2019-08-31 ENCOUNTER — Ambulatory Visit (HOSPITAL_COMMUNITY): Payer: Medicare HMO | Admitting: Certified Registered"

## 2019-08-31 DIAGNOSIS — K219 Gastro-esophageal reflux disease without esophagitis: Secondary | ICD-10-CM | POA: Diagnosis not present

## 2019-08-31 DIAGNOSIS — R51 Headache: Secondary | ICD-10-CM | POA: Insufficient documentation

## 2019-08-31 DIAGNOSIS — I69354 Hemiplegia and hemiparesis following cerebral infarction affecting left non-dominant side: Secondary | ICD-10-CM | POA: Diagnosis not present

## 2019-08-31 DIAGNOSIS — Z8249 Family history of ischemic heart disease and other diseases of the circulatory system: Secondary | ICD-10-CM | POA: Insufficient documentation

## 2019-08-31 DIAGNOSIS — M542 Cervicalgia: Secondary | ICD-10-CM | POA: Insufficient documentation

## 2019-08-31 DIAGNOSIS — E11319 Type 2 diabetes mellitus with unspecified diabetic retinopathy without macular edema: Secondary | ICD-10-CM | POA: Insufficient documentation

## 2019-08-31 DIAGNOSIS — M1990 Unspecified osteoarthritis, unspecified site: Secondary | ICD-10-CM | POA: Insufficient documentation

## 2019-08-31 DIAGNOSIS — Z7951 Long term (current) use of inhaled steroids: Secondary | ICD-10-CM | POA: Diagnosis not present

## 2019-08-31 DIAGNOSIS — G8929 Other chronic pain: Secondary | ICD-10-CM | POA: Insufficient documentation

## 2019-08-31 DIAGNOSIS — K828 Other specified diseases of gallbladder: Secondary | ICD-10-CM | POA: Diagnosis not present

## 2019-08-31 DIAGNOSIS — Z87442 Personal history of urinary calculi: Secondary | ICD-10-CM | POA: Insufficient documentation

## 2019-08-31 DIAGNOSIS — G4733 Obstructive sleep apnea (adult) (pediatric): Secondary | ICD-10-CM | POA: Diagnosis not present

## 2019-08-31 DIAGNOSIS — I10 Essential (primary) hypertension: Secondary | ICD-10-CM | POA: Diagnosis not present

## 2019-08-31 DIAGNOSIS — I2510 Atherosclerotic heart disease of native coronary artery without angina pectoris: Secondary | ICD-10-CM | POA: Diagnosis not present

## 2019-08-31 DIAGNOSIS — M549 Dorsalgia, unspecified: Secondary | ICD-10-CM | POA: Insufficient documentation

## 2019-08-31 DIAGNOSIS — E1151 Type 2 diabetes mellitus with diabetic peripheral angiopathy without gangrene: Secondary | ICD-10-CM | POA: Insufficient documentation

## 2019-08-31 DIAGNOSIS — E7800 Pure hypercholesterolemia, unspecified: Secondary | ICD-10-CM | POA: Insufficient documentation

## 2019-08-31 DIAGNOSIS — Z7902 Long term (current) use of antithrombotics/antiplatelets: Secondary | ICD-10-CM | POA: Insufficient documentation

## 2019-08-31 DIAGNOSIS — E1142 Type 2 diabetes mellitus with diabetic polyneuropathy: Secondary | ICD-10-CM | POA: Diagnosis not present

## 2019-08-31 DIAGNOSIS — Z8349 Family history of other endocrine, nutritional and metabolic diseases: Secondary | ICD-10-CM | POA: Insufficient documentation

## 2019-08-31 DIAGNOSIS — I252 Old myocardial infarction: Secondary | ICD-10-CM | POA: Diagnosis not present

## 2019-08-31 DIAGNOSIS — Z79899 Other long term (current) drug therapy: Secondary | ICD-10-CM | POA: Diagnosis not present

## 2019-08-31 DIAGNOSIS — Z794 Long term (current) use of insulin: Secondary | ICD-10-CM | POA: Insufficient documentation

## 2019-08-31 DIAGNOSIS — Z800 Family history of malignant neoplasm of digestive organs: Secondary | ICD-10-CM | POA: Insufficient documentation

## 2019-08-31 DIAGNOSIS — Z7982 Long term (current) use of aspirin: Secondary | ICD-10-CM | POA: Diagnosis not present

## 2019-08-31 DIAGNOSIS — E1159 Type 2 diabetes mellitus with other circulatory complications: Secondary | ICD-10-CM

## 2019-08-31 DIAGNOSIS — Z86010 Personal history of colonic polyps: Secondary | ICD-10-CM | POA: Insufficient documentation

## 2019-08-31 DIAGNOSIS — I69398 Other sequelae of cerebral infarction: Secondary | ICD-10-CM | POA: Insufficient documentation

## 2019-08-31 DIAGNOSIS — K8010 Calculus of gallbladder with chronic cholecystitis without obstruction: Secondary | ICD-10-CM | POA: Insufficient documentation

## 2019-08-31 DIAGNOSIS — Z825 Family history of asthma and other chronic lower respiratory diseases: Secondary | ICD-10-CM | POA: Insufficient documentation

## 2019-08-31 DIAGNOSIS — Z955 Presence of coronary angioplasty implant and graft: Secondary | ICD-10-CM | POA: Insufficient documentation

## 2019-08-31 DIAGNOSIS — Z801 Family history of malignant neoplasm of trachea, bronchus and lung: Secondary | ICD-10-CM | POA: Insufficient documentation

## 2019-08-31 DIAGNOSIS — Z833 Family history of diabetes mellitus: Secondary | ICD-10-CM | POA: Insufficient documentation

## 2019-08-31 DIAGNOSIS — Z9049 Acquired absence of other specified parts of digestive tract: Secondary | ICD-10-CM | POA: Insufficient documentation

## 2019-08-31 DIAGNOSIS — Z823 Family history of stroke: Secondary | ICD-10-CM | POA: Insufficient documentation

## 2019-08-31 HISTORY — PX: CHOLECYSTECTOMY: SHX55

## 2019-08-31 LAB — GLUCOSE, CAPILLARY
Glucose-Capillary: 287 mg/dL — ABNORMAL HIGH (ref 70–99)
Glucose-Capillary: 233 mg/dL — ABNORMAL HIGH (ref 70–99)
Glucose-Capillary: 240 mg/dL — ABNORMAL HIGH (ref 70–99)

## 2019-08-31 SURGERY — LAPAROSCOPIC CHOLECYSTECTOMY
Anesthesia: General | Site: Abdomen

## 2019-08-31 MED ORDER — FENTANYL CITRATE (PF) 100 MCG/2ML IJ SOLN
INTRAMUSCULAR | Status: AC
  Filled 2019-08-31: qty 2

## 2019-08-31 MED ORDER — SUGAMMADEX SODIUM 200 MG/2ML IV SOLN
INTRAVENOUS | Status: DC | PRN
  Administered 2019-08-31: 220 mg via INTRAVENOUS

## 2019-08-31 MED ORDER — FENTANYL CITRATE (PF) 100 MCG/2ML IJ SOLN: 25.0000 ug | INTRAMUSCULAR | Status: DC | PRN

## 2019-08-31 MED ORDER — DEXAMETHASONE SODIUM PHOSPHATE 10 MG/ML IJ SOLN
INTRAMUSCULAR | Status: DC | PRN
  Administered 2019-08-31: 8 mg via INTRAVENOUS

## 2019-08-31 MED ORDER — BUPIVACAINE HCL (PF) 0.25 % IJ SOLN
INTRAMUSCULAR | Status: AC
  Filled 2019-08-31: qty 30

## 2019-08-31 MED ORDER — LIDOCAINE 2% (20 MG/ML) 5 ML SYRINGE
INTRAMUSCULAR | Status: DC | PRN
  Administered 2019-08-31: 100 mg via INTRAVENOUS

## 2019-08-31 MED ORDER — INSULIN ASPART 100 UNIT/ML ~~LOC~~ SOLN
6.0000 [IU] | Freq: Once | SUBCUTANEOUS | Status: AC
  Administered 2019-08-31: 6 [IU] via SUBCUTANEOUS

## 2019-08-31 MED ORDER — ACETAMINOPHEN 500 MG PO TABS: 1000.0000 mg | ORAL_TABLET | Freq: Three times a day (TID) | ORAL | 0 refills | Status: AC

## 2019-08-31 MED ORDER — ACETAMINOPHEN 500 MG PO TABS
ORAL_TABLET | ORAL | Status: AC
  Administered 2019-08-31: 1000 mg via ORAL
  Filled 2019-08-31: qty 2

## 2019-08-31 MED ORDER — MEPERIDINE HCL 50 MG/ML IJ SOLN: 6.2500 mg | INTRAMUSCULAR | Status: DC | PRN

## 2019-08-31 MED ORDER — DEXAMETHASONE SODIUM PHOSPHATE 10 MG/ML IJ SOLN
INTRAMUSCULAR | Status: AC
  Filled 2019-08-31: qty 1

## 2019-08-31 MED ORDER — PROPOFOL 10 MG/ML IV BOLUS
INTRAVENOUS | Status: DC | PRN
  Administered 2019-08-31: 100 mg via INTRAVENOUS

## 2019-08-31 MED ORDER — LIDOCAINE 2% (20 MG/ML) 5 ML SYRINGE
INTRAMUSCULAR | Status: AC
  Filled 2019-08-31: qty 5

## 2019-08-31 MED ORDER — SCOPOLAMINE 1 MG/3DAYS TD PT72
1.0000 | MEDICATED_PATCH | TRANSDERMAL | Status: DC
  Administered 2019-08-31: 06:00:00 1.5 mg via TRANSDERMAL

## 2019-08-31 MED ORDER — TRAMADOL HCL 50 MG PO TABS: 50.0000 mg | ORAL_TABLET | Freq: Four times a day (QID) | ORAL | 0 refills | Status: DC | PRN

## 2019-08-31 MED ORDER — SCOPOLAMINE 1 MG/3DAYS TD PT72
MEDICATED_PATCH | TRANSDERMAL | Status: AC
  Administered 2019-08-31: 06:00:00 1.5 mg via TRANSDERMAL
  Filled 2019-08-31: qty 1

## 2019-08-31 MED ORDER — PHENYLEPHRINE 40 MCG/ML (10ML) SYRINGE FOR IV PUSH (FOR BLOOD PRESSURE SUPPORT)
PREFILLED_SYRINGE | INTRAVENOUS | Status: AC
  Filled 2019-08-31: qty 20

## 2019-08-31 MED ORDER — 0.9 % SODIUM CHLORIDE (POUR BTL) OPTIME
TOPICAL | Status: DC | PRN
  Administered 2019-08-31: 1000 mL

## 2019-08-31 MED ORDER — ONDANSETRON HCL 4 MG/2ML IJ SOLN
INTRAMUSCULAR | Status: DC | PRN
  Administered 2019-08-31: 4 mg via INTRAVENOUS

## 2019-08-31 MED ORDER — SUGAMMADEX SODIUM 500 MG/5ML IV SOLN
INTRAVENOUS | Status: AC
  Filled 2019-08-31: qty 5

## 2019-08-31 MED ORDER — BUPIVACAINE HCL (PF) 0.25 % IJ SOLN
INTRAMUSCULAR | Status: DC | PRN
  Administered 2019-08-31: 30 mL

## 2019-08-31 MED ORDER — MIDAZOLAM HCL 2 MG/2ML IJ SOLN
INTRAMUSCULAR | Status: AC
  Filled 2019-08-31: qty 2

## 2019-08-31 MED ORDER — SODIUM CHLORIDE 0.9 % IV SOLN
INTRAVENOUS | Status: AC
  Filled 2019-08-31: qty 2

## 2019-08-31 MED ORDER — LACTATED RINGERS IV SOLN: INTRAVENOUS | Status: DC

## 2019-08-31 MED ORDER — CHLORHEXIDINE GLUCONATE CLOTH 2 % EX PADS: 6.0000 | MEDICATED_PAD | Freq: Once | CUTANEOUS | Status: DC

## 2019-08-31 MED ORDER — PROPOFOL 10 MG/ML IV BOLUS
INTRAVENOUS | Status: AC
  Filled 2019-08-31: qty 20

## 2019-08-31 MED ORDER — ONDANSETRON HCL 4 MG/2ML IJ SOLN
INTRAMUSCULAR | Status: AC
  Filled 2019-08-31: qty 2

## 2019-08-31 MED ORDER — FENTANYL CITRATE (PF) 100 MCG/2ML IJ SOLN
INTRAMUSCULAR | Status: DC | PRN
  Administered 2019-08-31 (×2): 25 ug via INTRAVENOUS
  Administered 2019-08-31: 50 ug via INTRAVENOUS

## 2019-08-31 MED ORDER — ACETAMINOPHEN 500 MG PO TABS
1000.0000 mg | ORAL_TABLET | ORAL | Status: AC
  Administered 2019-08-31: 06:00:00 1000 mg via ORAL

## 2019-08-31 MED ORDER — SODIUM CHLORIDE 0.9 % IV SOLN
2.0000 g | INTRAVENOUS | Status: AC
  Administered 2019-08-31: 2 g via INTRAVENOUS

## 2019-08-31 MED ORDER — SUCCINYLCHOLINE CHLORIDE 200 MG/10ML IV SOSY
PREFILLED_SYRINGE | INTRAVENOUS | Status: DC | PRN
  Administered 2019-08-31: 100 mg via INTRAVENOUS

## 2019-08-31 MED ORDER — GABAPENTIN 300 MG PO CAPS: 300.0000 mg | ORAL_CAPSULE | ORAL | Status: DC

## 2019-08-31 MED ORDER — ROCURONIUM BROMIDE 10 MG/ML (PF) SYRINGE
PREFILLED_SYRINGE | INTRAVENOUS | Status: DC | PRN
  Administered 2019-08-31: 50 mg via INTRAVENOUS
  Administered 2019-08-31: 20 mg via INTRAVENOUS

## 2019-08-31 MED ORDER — MIDAZOLAM HCL 2 MG/2ML IJ SOLN
INTRAMUSCULAR | Status: DC | PRN
  Administered 2019-08-31: 2 mg via INTRAVENOUS

## 2019-08-31 MED ORDER — LACTATED RINGERS IV SOLN
INTRAVENOUS | Status: DC
  Administered 2019-08-31: 06:00:00 via INTRAVENOUS

## 2019-08-31 MED ORDER — GABAPENTIN 300 MG PO CAPS
ORAL_CAPSULE | ORAL | Status: AC
  Filled 2019-08-31: qty 1

## 2019-08-31 MED ORDER — DEXMEDETOMIDINE HCL 200 MCG/2ML IV SOLN
INTRAVENOUS | Status: DC | PRN
  Administered 2019-08-31 (×4): 8 ug via INTRAVENOUS

## 2019-08-31 MED ORDER — LACTATED RINGERS IR SOLN
Status: DC | PRN
  Administered 2019-08-31: 1000 mL

## 2019-08-31 MED ORDER — SUCCINYLCHOLINE CHLORIDE 200 MG/10ML IV SOSY
PREFILLED_SYRINGE | INTRAVENOUS | Status: AC
  Filled 2019-08-31: qty 10

## 2019-08-31 MED ORDER — PHENYLEPHRINE 40 MCG/ML (10ML) SYRINGE FOR IV PUSH (FOR BLOOD PRESSURE SUPPORT)
PREFILLED_SYRINGE | INTRAVENOUS | Status: DC | PRN
  Administered 2019-08-31 (×3): 120 ug via INTRAVENOUS
  Administered 2019-08-31: 80 ug via INTRAVENOUS
  Administered 2019-08-31: 120 ug via INTRAVENOUS

## 2019-08-31 MED ORDER — METOCLOPRAMIDE HCL 5 MG/ML IJ SOLN: 10.0000 mg | Freq: Once | INTRAMUSCULAR | Status: DC | PRN

## 2019-08-31 SURGICAL SUPPLY — 52 items
APPLICATOR ARISTA FLEXITIP XL (MISCELLANEOUS) IMPLANT
APPLIER CLIP 5 13 M/L LIGAMAX5 (MISCELLANEOUS) ×3
APPLIER CLIP ROT 10 11.4 M/L (STAPLE)
BENZOIN TINCTURE PRP APPL 2/3 (GAUZE/BANDAGES/DRESSINGS) ×3 IMPLANT
BNDG ADH 1X3 SHEER STRL LF (GAUZE/BANDAGES/DRESSINGS) ×3 IMPLANT
CABLE HIGH FREQUENCY MONO STRZ (ELECTRODE) ×3 IMPLANT
CHLORAPREP W/TINT 26 (MISCELLANEOUS) ×3 IMPLANT
CLIP APPLIE 5 13 M/L LIGAMAX5 (MISCELLANEOUS) ×1 IMPLANT
CLIP APPLIE ROT 10 11.4 M/L (STAPLE) IMPLANT
CLIP VESOLOCK MED LG 6/CT (CLIP) IMPLANT
CLOSURE WOUND 1/2 X4 (GAUZE/BANDAGES/DRESSINGS) ×1
COVER MAYO STAND STRL (DRAPES) IMPLANT
COVER SURGICAL LIGHT HANDLE (MISCELLANEOUS) ×3 IMPLANT
COVER WAND RF STERILE (DRAPES) IMPLANT
DECANTER SPIKE VIAL GLASS SM (MISCELLANEOUS) ×3 IMPLANT
DERMABOND ADVANCED (GAUZE/BANDAGES/DRESSINGS)
DERMABOND ADVANCED .7 DNX12 (GAUZE/BANDAGES/DRESSINGS) IMPLANT
DRAPE C-ARM 42X120 X-RAY (DRAPES) IMPLANT
DRSG TEGADERM 2-3/8X2-3/4 SM (GAUZE/BANDAGES/DRESSINGS) ×3 IMPLANT
ELECT PENCIL ROCKER SW 15FT (MISCELLANEOUS) ×3 IMPLANT
ELECT REM PT RETURN 15FT ADLT (MISCELLANEOUS) ×3 IMPLANT
GAUZE SPONGE 2X2 8PLY STRL LF (GAUZE/BANDAGES/DRESSINGS) ×1 IMPLANT
GLOVE BIO SURGEON STRL SZ7.5 (GLOVE) ×3 IMPLANT
GLOVE INDICATOR 8.0 STRL GRN (GLOVE) ×3 IMPLANT
GOWN STRL REUS W/TWL XL LVL3 (GOWN DISPOSABLE) ×9 IMPLANT
GRASPER SUT TROCAR 14GX15 (MISCELLANEOUS) IMPLANT
HEMOSTAT ARISTA ABSORB 3G PWDR (HEMOSTASIS) IMPLANT
HEMOSTAT SNOW SURGICEL 2X4 (HEMOSTASIS) IMPLANT
KIT BASIN OR (CUSTOM PROCEDURE TRAY) ×3 IMPLANT
KIT TURNOVER KIT A (KITS) IMPLANT
L-HOOK LAP DISP 36CM (ELECTROSURGICAL) ×3
LHOOK LAP DISP 36CM (ELECTROSURGICAL) ×1 IMPLANT
PENCIL SMOKE EVACUATOR (MISCELLANEOUS) IMPLANT
POUCH RETRIEVAL ECOSAC 10 (ENDOMECHANICALS) ×1 IMPLANT
POUCH RETRIEVAL ECOSAC 10MM (ENDOMECHANICALS) ×2
SCISSORS LAP 5X35 DISP (ENDOMECHANICALS) ×3 IMPLANT
SET CHOLANGIOGRAPH MIX (MISCELLANEOUS) IMPLANT
SET IRRIG TUBING LAPAROSCOPIC (IRRIGATION / IRRIGATOR) ×3 IMPLANT
SET TUBE SMOKE EVAC HIGH FLOW (TUBING) ×3 IMPLANT
SLEEVE XCEL OPT CAN 5 100 (ENDOMECHANICALS) ×9 IMPLANT
SPONGE GAUZE 2X2 STER 10/PKG (GAUZE/BANDAGES/DRESSINGS) ×2
STRIP CLOSURE SKIN 1/2X4 (GAUZE/BANDAGES/DRESSINGS) ×2 IMPLANT
SUT MNCRL AB 4-0 PS2 18 (SUTURE) ×3 IMPLANT
SUT VIC AB 0 UR5 27 (SUTURE) IMPLANT
SUT VICRYL 0 TIES 12 18 (SUTURE) IMPLANT
SUT VICRYL 0 UR6 27IN ABS (SUTURE) IMPLANT
TOWEL OR 17X26 10 PK STRL BLUE (TOWEL DISPOSABLE) ×3 IMPLANT
TOWEL OR NON WOVEN STRL DISP B (DISPOSABLE) ×3 IMPLANT
TRAY LAPAROSCOPIC (CUSTOM PROCEDURE TRAY) ×3 IMPLANT
TROCAR BLADELESS OPT 5 100 (ENDOMECHANICALS) ×3 IMPLANT
TROCAR XCEL BLUNT TIP 100MML (ENDOMECHANICALS) IMPLANT
TROCAR XCEL NON-BLD 11X100MML (ENDOMECHANICALS) IMPLANT

## 2019-08-31 NOTE — Transfer of Care (Signed)
Immediate Anesthesia Transfer of Care Note  Patient: Johnathan Keith  Procedure(s) Performed: LAPAROSCOPIC CHOLECYSTECTOMY (N/A Abdomen)  Patient Location: PACU  Anesthesia Type:General  Level of Consciousness: awake  Airway & Oxygen Therapy: Patient Spontanous Breathing and Patient connected to face mask oxygen  Post-op Assessment: Report given to RN and Post -op Vital signs reviewed and stable  Post vital signs: Reviewed and stable  Last Vitals:  Vitals Value Taken Time  BP 111/63 08/31/19 0926  Temp 36.4 C 08/31/19 0926  Pulse 67 08/31/19 0928  Resp 19 08/31/19 0928  SpO2 100 % 08/31/19 0928  Vitals shown include unvalidated device data.  Last Pain:  Vitals:   08/31/19 0615  TempSrc: Oral         Complications: No apparent anesthesia complications

## 2019-08-31 NOTE — Discharge Instructions (Signed)
RESUME PLAVIX ON Thursday September 09/02/2019  [facility] LAPAROSCOPIC SURGERY: POST OP INSTRUCTIONS Always review your discharge instruction sheet given to you by the facility where your surgery was performed. IF YOU HAVE DISABILITY OR FAMILY LEAVE FORMS, YOU MUST BRING THEM TO THE OFFICE FOR PROCESSING.   DO NOT GIVE THEM TO YOUR DOCTOR.  PAIN CONTROL  1. First take acetaminophen (Tylenol) AND/or ibuprofen (Advil) to control your pain after surgery.  Follow directions on package.  Taking acetaminophen (Tylenol) and/or ibuprofen (Advil) regularly after surgery will help to control your pain and lower the amount of prescription pain medication you may need.  You should not take more than 3,000 mg (3 grams) of acetaminophen (Tylenol) in 24 hours.  You should not take ibuprofen (Advil), aleve, motrin, naprosyn or other NSAIDS if you have a history of stomach ulcers or chronic kidney disease.  2. A prescription for pain medication may be given to you upon discharge.  Take your pain medication as prescribed, if you still have uncontrolled pain after taking acetaminophen (Tylenol) or ibuprofen (Advil). 3. Use ice packs to help control pain. 4. If you need a refill on your pain medication, please contact your pharmacy.  They will contact our office to request authorization. Prescriptions will not be filled after 5pm or on week-ends.  HOME MEDICATIONS 5. Take your usually prescribed medications unless otherwise directed.  DIET 6. You should follow a light diet the first few days after arrival home.  Be sure to include lots of fluids daily. Avoid fatty, fried foods.   CONSTIPATION 7. It is common to experience some constipation after surgery and if you are taking pain medication.  Increasing fluid intake and taking a stool softener (such as Colace) will usually help or prevent this problem from occurring.  A mild laxative (Milk of Magnesia or Miralax) should be taken according  to package instructions if there are no bowel movements after 48 hours.  WOUND/INCISION CARE 8. Most patients will experience some swelling and bruising in the area of the incisions.  Ice packs will help.  Swelling and bruising can take several days to resolve.  9. Unless discharge instructions indicate otherwise, follow guidelines below  a. STERI-STRIPS - you may remove your outer bandages 48 hours after surgery, and you may shower at that time.  You have steri-strips (small skin tapes) in place directly over the incision.  These strips should be left on the skin for 7-10 days.   b. DERMABOND/SKIN GLUE - you may shower in 24 hours.  The glue will flake off over the next 2-3 weeks. 10. Any sutures or staples will be removed at the office during your follow-up visit.  ACTIVITIES 11. You may resume regular (light) daily activities beginning the next day--such as daily self-care, walking, climbing stairs--gradually increasing activities as tolerated.  You may have sexual intercourse when it is comfortable.  Refrain from any heavy lifting or straining until approved by your doctor. a. You may drive when you are no longer taking prescription pain medication, you can comfortably wear a seatbelt, and you can safely maneuver your car and apply brakes.  FOLLOW-UP 12. You should see your doctor in the office for a follow-up appointment approximately 2-3 weeks after your surgery.  You should have been given your post-op/follow-up appointment when your surgery was scheduled.  If you did not receive a post-op/follow-up appointment, make sure that you call for this appointment within a day or two after you arrive home to  insure a convenient appointment time.  OTHER INSTRUCTIONS 13. DO NOT LIFT/push/pull anything greater than 10lbs for  4 weeks  WHEN TO CALL YOUR DOCTOR: 1. Fever over 101.0 2. Inability to urinate 3. Continued bleeding from incision. 4. Increased pain, redness, or drainage from the  incision. 5. Increasing abdominal pain  The clinic staff is available to answer your questions during regular business hours.  Please dont hesitate to call and ask to speak to one of the nurses for clinical concerns.  If you have a medical emergency, go to the nearest emergency room or call 911.  A surgeon from [facility] is always on call at the hospital. [address] [place], [place], [place]  [address] ? [address], [place], [place]   [address] [phone] ? [phone] ? FAX [phone] Web site: [email or web address]     Managing Your Pain After Surgery Without Opioids  DO NOT TAKE MOTRIN/IBUPROFEN/ALEVE WHILE TAKING PLAVIX  Thank you for participating in our program to help patients manage their pain after surgery without opioids. This is part of our effort to provide you with the best care possible, without exposing you or your family to the risk that opioids pose.  What pain can I expect after surgery? You can expect to have some pain after surgery. This is normal. The pain is typically worse the day after surgery, and quickly begins to get better. Many studies have found that many patients are able to manage their pain after surgery with Over-the-Counter (OTC) medications such as Tylenol and Motrin. If you have a condition that does not allow you to take Tylenol or Motrin, notify your surgical team.  How will I manage my pain? The best strategy for controlling your pain after surgery is around the clock pain control with Tylenol (acetaminophen) and Motrin (ibuprofen or Advil). Alternating these medications with each other allows you to maximize your pain control. In addition to Tylenol and Motrin, you can use heating pads or ice packs on your incisions to help reduce your pain.  How will I alternate your regular strength over-the-counter pain medication? You will take a dose of pain medication every three hours. ; Start by taking 650  mg of Tylenol (2 pills of 325 mg) ; 3 hours later take 600 mg of Motrin (3 pills of 200 mg) ; 3 hours after taking the Motrin take 650 mg of Tylenol ; 3 hours after that take 600 mg of Motrin.   - 1 -  See example - if your first dose of Tylenol is at 12:00 PM   12:00 PM Tylenol 650 mg (2 pills of 325 mg)  3:00 PM Motrin 600 mg (3 pills of 200 mg)  6:00 PM Tylenol 650 mg (2 pills of 325 mg)  9:00 PM Motrin 600 mg (3 pills of 200 mg)  Continue alternating every 3 hours   We recommend that you follow this schedule around-the-clock for at least 3 days after surgery, or until you feel that it is no longer needed. Use the table on the last page of this handout to keep track of the medications you are taking. Important: Do not take more than 3000mg  of Tylenol or 2300mg  of Motrin in a 24-hour period. Do not take ibuprofen/Motrin if you have a history of bleeding stomach ulcers, severe kidney disease, &/or actively taking a blood thinner  What if I still have pain? If you have pain that is not controlled with the over-the-counter pain medications (Tylenol and  Motrin or Advil) you might have what we call breakthrough pain. You will receive a prescription for a small amount of an opioid pain medication such as Oxycodone, Tramadol, or Tylenol with Codeine. Use these opioid pills in the first 24 hours after surgery if you have breakthrough pain. Do not take more than 1 pill every 4-6 hours.  If you still have uncontrolled pain after using all opioid pills, don't hesitate to call our staff using the number provided. We will help make sure you are managing your pain in the best way possible, and if necessary, we can provide a prescription for additional pain medication.   Day 1    Time  Name of Medication Number of pills taken  Amount of Acetaminophen  Pain Level   Comments  AM PM       AM PM       AM PM       AM PM       AM PM       AM PM       AM PM       AM PM       Total Daily  amount of Acetaminophen Do not take more than  3,000 mg per day      Day 2    Time  Name of Medication Number of pills taken  Amount of Acetaminophen  Pain Level   Comments  AM PM       AM PM       AM PM       AM PM       AM PM       AM PM       AM PM       AM PM       Total Daily amount of Acetaminophen Do not take more than  3,000 mg per day      Day 3    Time  Name of Medication Number of pills taken  Amount of Acetaminophen  Pain Level   Comments  AM PM       AM PM       AM PM       AM PM          AM PM       AM PM       AM PM       AM PM       Total Daily amount of Acetaminophen Do not take more than  3,000 mg per day      Day 4    Time  Name of Medication Number of pills taken  Amount of Acetaminophen  Pain Level   Comments  AM PM       AM PM       AM PM       AM PM       AM PM       AM PM       AM PM       AM PM       Total Daily amount of Acetaminophen Do not take more than  3,000 mg per day      Day 5    Time  Name of Medication Number of pills taken  Amount of Acetaminophen  Pain Level   Comments  AM PM       AM PM       AM PM       AM  PM       AM PM       AM PM       AM PM       AM PM       Total Daily amount of Acetaminophen Do not take more than  3,000 mg per day       Day 6    Time  Name of Medication Number of pills taken  Amount of Acetaminophen  Pain Level  Comments  AM PM       AM PM       AM PM       AM PM       AM PM       AM PM       AM PM       AM PM       Total Daily amount of Acetaminophen Do not take more than  3,000 mg per day      Day 7    Time  Name of Medication Number of pills taken  Amount of Acetaminophen  Pain Level   Comments  AM PM       AM PM       AM PM       AM PM       AM PM       AM PM       AM PM       AM PM       Total Daily amount of Acetaminophen Do not take more than  3,000 mg per day        For additional information about how and where to  safely dispose of unused opioid medications - [employer]uyhttps://www.morepowerful[employer].org  Disclaimer: This document contains information and/or instructional materials adapted from [facility] for the typical patient with your condition. It does not replace medical advice from your health care provider because your experience may differ from that of the typical patient. Talk to your health care provider if you have any questions about this document, your condition or your treatment plan. Adapted from [facility]

## 2019-08-31 NOTE — Anesthesia Procedure Notes (Signed)
Procedure Name: Intubation Date/Time: 08/31/2019 7:44 AM Performed by: Niel Hummer, CRNA Pre-anesthesia Checklist: Patient identified, Emergency Drugs available, Suction available and Patient being monitored Patient Re-evaluated:Patient Re-evaluated prior to induction Oxygen Delivery Method: Circle system utilized Preoxygenation: Pre-oxygenation with 100% oxygen Induction Type: IV induction Ventilation: Oral airway inserted - appropriate to patient size and Two handed mask ventilation required Laryngoscope Size: Glidescope and 4 Grade View: Grade I Tube type: Oral Tube size: 7.5 mm Number of attempts: 2 Airway Equipment and Method: Stylet and Video-laryngoscopy Placement Confirmation: ETT inserted through vocal cords under direct vision,  positive ETCO2 and breath sounds checked- equal and bilateral Secured at: 22 cm Tube secured with: Tape Dental Injury: Teeth and Oropharynx as per pre-operative assessment  Comments: DL x1 with MAC4, grade 3 view, unable to pass tube with bougie. 2-hand mask ventilated the pt. Glidescope 4 DL, grade 1 view. Passed tube successfully

## 2019-08-31 NOTE — Op Note (Signed)
Johnathan Keith [id number] 1961-05-10 08/31/2019  Laparoscopic Cholecystectomy  Procedure Note  Indications: This patient presents with symptomatic gallbladder disease and will undergo laparoscopic cholecystectomy.  Patient had previously undergone percutaneous cholecystostomy tube placement back in June 2020 while he was admitted for sepsis.  He underwent ERCP with sphincterotomy earlier this month for choledocholithiasis.  He was felt stable for interval cholecystectomy.  Pre-operative Diagnosis: Calculus of gallbladder with other cholecystitis, without mention of obstruction, s/p percutaneous cholecystectomy  Post-operative Diagnosis: Same; fibrotic liver  Surgeon: Gaynelle Adu MD FACS  Assistants: Feliciana Rossetti MD FACS  Anesthesia: General endotracheal anesthesia  Procedure Details  The patient was seen again in the Holding Room. The risks, benefits, complications, treatment options, and expected outcomes were discussed with the patient. The possibilities of reaction to medication, pulmonary aspiration, perforation of viscus, bleeding, recurrent infection, finding a normal gallbladder, the need for additional procedures, failure to diagnose a condition, the possible need to convert to an open procedure, and creating a complication requiring transfusion or operation were discussed with the patient. The likelihood of improving the patient's symptoms with return to their baseline status is good.  The patient and/or family concurred with the proposed plan, giving informed consent. The site of surgery properly noted. The patient was taken to Operating Room, identified as Johnathan Keith and the procedure verified as Laparoscopic Cholecystectomy with possible Intraoperative Cholangiogram. A Time Out was held and the above information confirmed. Antibiotic prophylaxis was administered.   Prior to the induction of general anesthesia, antibiotic prophylaxis was administered. General endotracheal  anesthesia was then administered and tolerated well. After the induction, the abdomen was prepped with Chloraprep and draped in the sterile fashion. The patient was positioned in the supine position.  Local anesthetic agent was injected into the skin above the umbilicus and an incision made. We dissected down to the abdominal fascia with blunt dissection.  The fascia was incised vertically and we entered the peritoneal cavity bluntly.  A pursestring suture of 0-Vicryl was placed around the fascial opening.  The Hasson cannula was inserted and secured with the stay suture.  Pneumoperitoneum was then created with CO2 and tolerated well without any adverse changes in the patient's vital signs. An 5-mm port was placed in the subxiphoid position.  Two 5-mm ports were placed in the right upper quadrant. All skin incisions were infiltrated with a local anesthetic agent before making the incision and placing the trocars.   We positioned the patient in reverse Trendelenburg, tilted slightly to the patient's left.  The gallbladder was identified.  It had a very fibrotic appearing liver.  He also had a fair amount of central truncal obesity.  I cut the cholecystostomy tube as it entered the abdominal cavity above the liver.  My partner then grasped the fundus and lifted it up toward the shoulder.  We did have to take down surrounding adhesions in order to facilitate retraction.  Given his central truncal obesity & enlarged liver, I decided to place an additional 5 mm trocar in the left midabdomen to facilitate retraction of the transverse colon and hepatic flexure.  Adhesions were lysed bluntly and with the electrocautery where indicated, taking care not to injure any adjacent organs or viscus. The infundibulum was grasped and retracted laterally, exposing the peritoneum overlying the triangle of Calot. This was then divided and exposed in a blunt fashion. A critical view of the cystic duct and cystic artery was obtained.   The cystic duct was clearly identified and bluntly dissected circumferentially.  The cystic duct was then ligated with clips and divided. The cystic artery which had been identified & dissected free was ligated with clips and divided as well.   The gallbladder was dissected from the liver bed in retrograde fashion with the electrocautery. The gallbladder was removed and placed in an Ecco sac.  The gallbladder and Ecco sac were then removed through the umbilical port site. The liver bed was irrigated and inspected. Hemostasis was achieved with the electrocautery. Copious irrigation was utilized and was repeatedly aspirated until clear.  The pursestring suture was used to close the umbilical fascia.    We again inspected the right upper quadrant for hemostasis.  The umbilical closure was inspected and there was no air leak and nothing trapped within the closure. Pneumoperitoneum was released as we removed the trocars.  4-0 Monocryl was used to close the skin.   Benzoin, steri-strips, and clean dressings were applied. The patient was then extubated and brought to the recovery room in stable condition. Instrument, sponge, and needle counts were correct at closure and at the conclusion of the case.   Findings: Chronic Cholecystitis with Cholelithiasis  Estimated Blood Loss: Minimal         Drains: none         Specimens: Gallbladder           Complications: None; patient tolerated the procedure well.         Disposition: PACU - hemodynamically stable.         Condition: stable  Johnathan Keith. Johnathan Pulling, MD, FACS General, Bariatric, & Minimally Invasive Surgery [facility], [place]

## 2019-08-31 NOTE — Anesthesia Postprocedure Evaluation (Signed)
Anesthesia Post Note  Patient: Johnathan Keith  Procedure(s) Performed: LAPAROSCOPIC CHOLECYSTECTOMY (N/A Abdomen)     Patient location during evaluation: PACU Anesthesia Type: General Level of consciousness: awake and alert Pain management: pain level controlled Vital Signs Assessment: post-procedure vital signs reviewed and stable Respiratory status: spontaneous breathing, nonlabored ventilation, respiratory function stable and patient connected to nasal cannula oxygen Cardiovascular status: blood pressure returned to baseline and stable Postop Assessment: no apparent nausea or vomiting Anesthetic complications: no    Last Vitals:  Vitals:   08/31/19 1206 08/31/19 1411  BP: 135/76 (!) 176/77  Pulse: 78 88  Resp: 18 18  Temp:    SpO2: 97% 97%    Last Pain:  Vitals:   08/31/19 1411  TempSrc:   PainSc: 0-No pain                 Montez Hageman

## 2019-08-31 NOTE — H&P (Signed)
Johnathan Keith is an 58 y.o. male.   Chief Complaint: here for surgery HPI: 58 yo male presents for interval cholecystectomy after undergoing perc gb drain a few months when had sepsis. Sepsis has resolved along with posthospital Cdiff, AKI. Off plavix/asa. Card clearance obtained. Pt back to his usual state of health  Past Medical History:  Diagnosis Date  . Arthritis   . Chronic neck and back pain   . Clostridium difficile diarrhea    negative culture 08/17/19  . Coronary artery disease    drug-eluting stents placed in proximal and mid LAD  . Deviated septum   . Diabetic retinopathy (HCC)   . GERD (gastroesophageal reflux disease)    "occasionally" (03/23/2015)  . Headache    "more than 2/wk" (03/23/2015)  . Heart attack (HCC)    "in 1997 was told I had signs of a small heart attack that I didn't know I'd had"  . High cholesterol   . History of gout    "when I was younger"  . History of kidney stones    passed  . Hypertension   . Kidney disease   . Migraine    "had my 1st and only in 12/2014" (03/23/2015)  . Peripheral neuropathy    feet  . Peripheral vascular disease (HCC)    carotid artery disease  . Pneumonia 11/19,1/20  . PONV (postoperative nausea and vomiting) 2015   "was told I was confused and combative in recovery from the narcotics w/my carotid OR"  "During a colonoscopy my oxygen level dropped so low that I had to be woke up.- 03/2017 colonoscopy [facility]   . Sleep apnea    "severe; couldn't afford mask" (03/23/2015) uses O2 at night.no C-pap  . Stroke [facility]) 2014   "on walker for 2 months; left side is weaker and numb since" (03/23/2015), "2 confirmed strokes"  . Stroke (HCC) 05/10/2019   Aphasia mild  . Type II diabetes mellitus (HCC)     Past Surgical History:  Procedure Laterality Date  . BILIARY DILATION  08/11/2019   Procedure: BILIARY DILATION;  Surgeon: Meridee Score Netty Starring., MD;  Location: [facility] ENDOSCOPY;  Service: Gastroenterology;;  . BIOPSY   08/11/2019   Procedure: BIOPSY;  Surgeon: Lemar Lofty., MD;  Location: [facility] ENDOSCOPY;  Service: Gastroenterology;;  . Johnathan Keith  1983  . COLONOSCOPY W/ POLYPECTOMY    . CORONARY ANGIOPLASTY WITH STENT PLACEMENT  03/23/2015   "2"  . CORONARY STENT INTERVENTION N/A 09/29/2017   Procedure: CORONARY STENT INTERVENTION;  Surgeon: Runell Gess, MD;  Location: [facility] INVASIVE CV LAB;  Service: Cardiovascular;  Laterality: N/A;  . ENDARTERECTOMY Right 09/26/2014   Procedure: RIGHT CAROTID ENDARTERECTOMY WITH PATCH ANGIOPLASTY;  Surgeon: Fransisco Hertz, MD;  Location: [facility] OR;  Service: Vascular;  Laterality: Right;  . ENDOSCOPIC RETROGRADE CHOLANGIOPANCREATOGRAPHY (ERCP) WITH PROPOFOL N/A 08/11/2019   Procedure: ENDOSCOPIC RETROGRADE CHOLANGIOPANCREATOGRAPHY (ERCP) WITH PROPOFOL;  Surgeon: Lemar Lofty., MD;  Location: [facility] ENDOSCOPY;  Service: Gastroenterology;  Laterality: N/A;  . ESOPHAGOGASTRODUODENOSCOPY (EGD) WITH PROPOFOL N/A 08/11/2019   Procedure: ESOPHAGOGASTRODUODENOSCOPY (EGD) WITH PROPOFOL;  Surgeon: Meridee Score Netty Starring., MD;  Location: [facility] ENDOSCOPY;  Service: Gastroenterology;  Laterality: N/A;  . EYE SURGERY Right May 2016   Cataract  . EYE SURGERY Left July 2016   Cataract  . HYDROCELE EXCISION  ~ 2008  . IR EXCHANGE BILIARY DRAIN  07/21/2019  . IR PERC CHOLECYSTOSTOMY  05/11/2019  . IR RADIOLOGIST EVAL & MGMT  06/30/2019  . LEFT HEART CATH  AND CORONARY ANGIOGRAPHY N/A 09/29/2017   Procedure: LEFT HEART CATH AND CORONARY ANGIOGRAPHY;  Surgeon: Runell Gess, MD;  Location: [facility] INVASIVE CV LAB;  Service: Cardiovascular;  Laterality: N/A;  . LEFT HEART CATHETERIZATION WITH CORONARY ANGIOGRAM N/A 03/23/2015   Procedure: LEFT HEART CATHETERIZATION WITH CORONARY ANGIOGRAM;  Surgeon: Runell Gess, MD;  Location: [facility] CATH LAB;  Service: Cardiovascular;  Laterality: N/A;  . REFRACTIVE SURGERY Bilateral 08/2014-09/2014   Diabetic retinopathy   . REMOVAL OF STONES  08/11/2019    Procedure: REMOVAL OF STONES;  Surgeon: Meridee Score Netty Starring., MD;  Location: [facility] ENDOSCOPY;  Service: Gastroenterology;;  . Gaspar Bidding DILATION N/A 08/11/2019   Procedure: Gaspar Bidding DILATION;  Surgeon: Lemar Lofty., MD;  Location: [facility] ENDOSCOPY;  Service: Gastroenterology;  Laterality: N/A;  . SPHINCTEROTOMY  08/11/2019   Procedure: SPHINCTEROTOMY;  Surgeon: Meridee Score Netty Starring., MD;  Location: [facility] ENDOSCOPY;  Service: Gastroenterology;;    Family History  Problem Relation Age of Onset  . Heart disease Mother   . Diabetes Mother   . Hyperlipidemia Mother   . Hypertension Mother   . Cancer Father        Lung Cancer   . Heart disease Father   . Heart attack Father   . Gout Brother   . Asthma Brother   . Hyperlipidemia Brother   . Hypertension Brother   . Diabetes Brother   . Colon cancer Brother        colon  . Stroke Paternal Grandfather   . Colon cancer Sister   . Hyperlipidemia Sister   . Hypertension Sister   . Gout Maternal Uncle   . Diabetes Maternal Grandfather   . Diabetes Daughter    Social History:  reports that he has never smoked. He has never used smokeless tobacco. He reports that he does not drink alcohol or use drugs.  Allergies: No Known Allergies  Medications Prior to Admission  Medication Sig Dispense Refill  . albuterol (PROVENTIL HFA;VENTOLIN HFA) 108 (90 Base) MCG/ACT inhaler Inhale 1 puff into the lungs 2 (two) times daily.     Marland Kitchen aspirin 81 MG tablet Take 1 tablet (81 mg total) by mouth daily. 1 tablet 0  . benazepril (LOTENSIN) 10 MG tablet TAKE 2 tablets by mouth TWICE daily (Patient taking differently: Take 10 mg by mouth 2 (two) times daily. ) 180 tablet 3  . cholecalciferol (VITAMIN D3) 25 MCG (1000 UT) tablet Take 1,000 Units by mouth daily.    . clopidogrel (PLAVIX) 75 MG tablet Take 1 tablet (75 mg total) by mouth daily. 90 tablet 1  . dicyclomine (BENTYL) 10 MG capsule TAKE 1 CAPSULE BY MOUTH 4 TIMES DAILY (Patient taking differently: Take  10 mg by mouth 4 (four) times daily. ) 120 capsule 0  . diphenoxylate-atropine (LOMOTIL) 2.5-0.025 MG tablet Take 1 tablet by mouth 2 (two) times daily as needed for diarrhea or loose stools. 30 tablet 3  . gabapentin (NEURONTIN) 300 MG capsule Take 300 mg by mouth at bedtime.    Marland Kitchen glimepiride (AMARYL) 4 MG tablet Take 4 mg by mouth 2 (two) times daily.    . insulin NPH Human (HUMULIN N,NOVOLIN N) 100 UNIT/ML injection Inject 10-40 Units into the skin 2 (two) times daily.     . insulin regular (NOVOLIN R) 100 units/mL injection Inject 10-40 Units into the skin 2 (two) times daily.     Marland Kitchen ipratropium-albuterol (DUONEB) 0.5-2.5 (3) MG/3ML SOLN Inhale 3 mLs into the lungs 4 (four) times daily as needed  for wheezing or shortness of breath.    . lipase/protease/amylase (CREON) 36000 UNITS CPEP capsule Take 36,000 Units by mouth 3 (three) times daily with meals.    . metoprolol succinate (TOPROL-XL) 25 MG 24 hr tablet Take 3 tablets (75 mg total) by mouth daily. (Patient taking differently: Take 25 mg by mouth daily. ) 90 tablet 6  . montelukast (SINGULAIR) 10 MG tablet Take 10 mg by mouth at bedtime.    Marland Kitchen oxybutynin (DITROPAN) 5 MG tablet Take 5 mg by mouth at bedtime.     . pantoprazole (PROTONIX) 40 MG tablet Take 1 tablet (40 mg total) by mouth daily. 30 tablet 6  . Polyvinyl Alcohol-Povidone (REFRESH OP) Place 1 drop into both eyes daily as needed (dry eyes).    . pravastatin (PRAVACHOL) 20 MG tablet Take 1 tablet (20 mg total) by mouth at bedtime. 90 tablet 3  . torsemide (DEMADEX) 20 MG tablet Take 20 mg by mouth daily.    . nitroGLYCERIN (NITROSTAT) 0.4 MG SL tablet Place 1 tablet (0.4 mg total) under the tongue every 5 (five) minutes as needed for chest pain. 25 tablet 3  . tamsulosin (FLOMAX) 0.4 MG CAPS capsule Take 1 capsule (0.4 mg total) by mouth daily. (Patient not taking: Reported on 08/25/2019) 30 capsule 1    Results for orders placed or performed during the hospital encounter of  08/31/19 (from the past 48 hour(s))  Glucose, capillary     Status: Abnormal   Collection Time: 08/31/19  6:13 AM  Result Value Ref Range   Glucose-Capillary 287 (H) 70 - 99 mg/dL   Comment 1 Notify RN    Comment 2 Document in Chart    No results found.  Review of Systems  Constitutional: Negative for chills, fever and weight loss.  HENT: Negative for nosebleeds.   Eyes: Negative for blurred vision.  Respiratory: Negative for shortness of breath.   Cardiovascular: Negative for chest pain, palpitations, orthopnea and PND.       Some DOE  Gastrointestinal: Positive for diarrhea (chronic but not like when had cdiff).  Genitourinary: Negative for dysuria and hematuria.  Musculoskeletal: Negative.   Skin: Negative for itching and rash.  Neurological: Positive for dizziness. Negative for focal weakness, seizures, loss of consciousness and headaches.       Denies TIAs, amaurosis fugax  Endo/Heme/Allergies: Does not bruise/bleed easily.  Psychiatric/Behavioral: The patient is not nervous/anxious.     Blood pressure (!) 168/84, pulse 79, temperature 98.3 F (36.8 C), temperature source Oral, resp. rate 18, height 5\' 6"  (1.676 m), weight 106 kg, SpO2 99 %. Physical Exam  Vitals reviewed. Constitutional: He is oriented to person, place, and time. He appears well-developed and well-nourished. No distress.  HENT:  Head: Normocephalic and atraumatic.  Right Ear: External ear normal.  Left Ear: External ear normal.  Eyes: Conjunctivae are normal. No scleral icterus.  Neck: Normal range of motion. Neck supple. No tracheal deviation present. No thyromegaly present.  Cardiovascular: Normal rate and normal heart sounds.  Respiratory: Effort normal and breath sounds normal. No stridor. No respiratory distress. He has no wheezes.  GI: Soft. He exhibits no distension. There is no abdominal tenderness.  gb drain RUQ  Musculoskeletal:        General: No tenderness or edema.  Lymphadenopathy:     He has no cervical adenopathy.  Neurological: He is alert and oriented to person, place, and time. He exhibits normal muscle tone.  Skin: Skin is warm and dry. No rash  noted. He is not diaphoretic. No erythema. No pallor.  Psychiatric: He has a normal mood and affect. His behavior is normal. Judgment and thought content normal.     Assessment/Plan Chronic cholecystitis s/p perc drain dm2 CAD H/o CVA HPL OSA  To OR for lap chole All questions asked/answered  Mary SellaEric M. Andrey CampanileWilson, MD, FACS General, Bariatric, & Minimally Invasive Surgery [facility], [place]    Gaynelle AduEric Lavone Barrientes, MD 08/31/2019, 7:23 AM

## 2019-09-01 ENCOUNTER — Encounter (HOSPITAL_COMMUNITY): Payer: Self-pay | Admitting: General Surgery

## 2019-09-01 LAB — SURGICAL PATHOLOGY

## 2019-09-02 ENCOUNTER — Other Ambulatory Visit: Payer: Self-pay

## 2019-09-02 ENCOUNTER — Encounter: Payer: Self-pay | Admitting: Gastroenterology

## 2019-09-02 ENCOUNTER — Ambulatory Visit (INDEPENDENT_AMBULATORY_CARE_PROVIDER_SITE_OTHER): Payer: Medicare HMO | Admitting: Gastroenterology

## 2019-09-02 VITALS — BP 128/80 | HR 79 | Temp 98.2°F | Ht 66.0 in | Wt 233.0 lb

## 2019-09-02 DIAGNOSIS — K8050 Calculus of bile duct without cholangitis or cholecystitis without obstruction: Secondary | ICD-10-CM

## 2019-09-02 DIAGNOSIS — Z800 Family history of malignant neoplasm of digestive organs: Secondary | ICD-10-CM | POA: Diagnosis not present

## 2019-09-02 DIAGNOSIS — K529 Noninfective gastroenteritis and colitis, unspecified: Secondary | ICD-10-CM | POA: Diagnosis not present

## 2019-09-02 DIAGNOSIS — K219 Gastro-esophageal reflux disease without esophagitis: Secondary | ICD-10-CM | POA: Diagnosis not present

## 2019-09-02 DIAGNOSIS — R1310 Dysphagia, unspecified: Secondary | ICD-10-CM

## 2019-09-02 MED ORDER — DIPHENOXYLATE-ATROPINE 2.5-0.025 MG PO TABS: 1.0000 | ORAL_TABLET | Freq: Two times a day (BID) | ORAL | 6 refills | Status: DC | PRN

## 2019-09-02 MED ORDER — PANTOPRAZOLE SODIUM 40 MG PO TBEC: 40.0000 mg | DELAYED_RELEASE_TABLET | Freq: Every day | ORAL | 6 refills | Status: DC

## 2019-09-02 NOTE — Patient Instructions (Addendum)
If you are age 60 or older, your body mass index should be between 23-30. Your Body mass index is 37.61 kg/m. If this is out of the aforementioned range listed, please consider follow up with your Primary Care Provider.  If you are age 68 or younger, your body mass index should be between 19-25. Your Body mass index is 37.61 kg/m. If this is out of the aformentioned range listed, please consider follow up with your Primary Care Provider.   To help prevent the possible spread of infection to our patients, communities, and staff; we will be implementing the following measures:  As of now we are not allowing any visitors/family members to accompany you to any upcoming appointments with [facility]. If you have any concerns about this please contact our office to discuss prior to the appointment.   We have sent the following medications to your pharmacy for you to pick up at your convenience: Lomotil twice daily as needed. Protonix 40mg  daily.  Please call our office at [phone] to set up your 6 month follow up visit.  Thank you,  Dr. Jackquline Denmark

## 2019-09-02 NOTE — Progress Notes (Signed)
IMPRESSION and PLAN:    #1. Choledocholithiasis s/p ERCP with ES and stone extraction 08/11/2019.  Underwent laparoscopic cholecystectomy 08/31/2019. #2. GERD with dysphagia (dysphagia has resolved after dilatation 08/11/2019). #3. Recent C. Diff colitis (off vancomycin since 07/18/2019). Neg C. Diff 08/2019 #4. H/O Chronic diarrhea - likely due to IBS with pred diarrhea, diabetic diarrhea, meds, may have mild EPI (had low fecal elastase in the past).  Neg colon 2015 and 2018 (except for small tubular adenoma), neg TI bx for crohn's and neg random colon Bx for microscopic colitis, neg CT scan A/P for  chronic pancreatitis. Failed trial of pancreatic enzymes. #5. Co-morbid conditions include CAD s/p PTCA, DM2, HTN, OSA, CVA 2016 #6. FH of colon cancer.      Plan: - Continue Lomotil 2/day prn (60), 6 ref. - Continue protonix 68m po qd. - FU in 6 months - If still with problems, trial of cholestryamine. - Continue home physical therapy and incentive spirometry. HPI:    Chief Complaint:   58year old very pleasant white [facility] acute cholecystitis/pancreatitis with SIRS d/t Klebsiella sepsis/ARF req cholecystostomy (CT did not show any evidence of pancreatitis, however adm lipase was 1276).  Has done very well since discharge.  Unfortunately had increased diarrhea and was tested positive for C. Difficile.  Treated with vancomycin 125 mg QID for 10 days (last dose 07/18/2019).  Cholecystostomy cholangiogram 06/30/2019-showed small cystic duct stone, small CBD stone without any significant ductal dilatation. It does show cholelithiasis as well.  Underwent ERCP with biliary sphincterotomy and stone extraction on 08/11/2019.  Also had EGD with dilatation prior to ERCP.  Esophagus was dilated to 17 mm savory.  This has resulted in complete resolution of dysphagia.  His heartburn is under good control with Protonix.  Underwent laparoscopic cholecystectomy on 08/31/2019.    Doing well except for generalized abdominal pain which is expected.  Has chronic longstanding diarrhea.  Baseline of 3-4 bowel movements per day.  Somewhat better with Lomotil and Bentyl.  He has stopped taking Creon due to cost he felt like he was having CVA.  Adamantly refuses pancreatic enzymes.  No jaundice dark urine or pale stools.  He denies having any fever or chills.  Past Medical History:  Diagnosis Date  . Arthritis   . Chronic neck and back pain   . Clostridium difficile diarrhea    negative culture 08/17/19  . Coronary artery disease    drug-eluting stents placed in proximal and mid LAD  . Deviated septum   . [place]   . GERD (gastroesophageal reflux disease)    "occasionally" (03/23/2015)  . Headache    "more than 2/wk" (03/23/2015)  . [place]    "in 1997 was told I had signs of a small heart attack that I didn't know I'd had"  . High cholesterol   . History of gout    "when I was younger"  . History of kidney stones    passed  . Hypertension   . Kidney disease   . Migraine    "had my 1st and only in 12/2014" (03/23/2015)  . Peripheral neuropathy    feet  . [place]ipheral vascular disease [place]    carotid artery disease  . Pneumonia 11/19,1/20  . PONV (postoperative nausea and vomiting) 2015   "was told I was confused and combative in recovery from the narcotics w/my carotid OR"  "During a colonoscopy my oxygen level dropped so low that I had  to be woke up.- 03/2017 colonoscopy [facility]   . Sleep apnea    "severe; couldn't afford mask" (03/23/2015) uses O2 at night.no C-pap  . Stroke [facility]) 2014   "on walker for 2 months; left side is weaker and numb since" (03/23/2015), "2 confirmed strokes"  . Stroke ([place]) 05/10/2019   Aphasia mild  . Type II diabetes mellitus ([place])     Current Outpatient Medications  Medication Sig Dispense Refill  . acetaminophen (TYLENOL) 500 MG tablet Take 2 tablets (1,000 mg total) by mouth every 8  (eight) hours for 5 days. 30 tablet 0  . albuterol (PROVENTIL HFA;VENTOLIN HFA) 108 (90 Base) MCG/ACT inhaler Inhale 1 puff into the lungs 2 (two) times daily.     Marland Kitchen aspirin 81 MG tablet Take 1 tablet (81 mg total) by mouth daily. 1 tablet 0  . benazepril (LOTENSIN) 10 MG tablet TAKE 2 tablets by mouth TWICE daily (Patient taking differently: Take 10 mg by mouth 2 (two) times daily. ) 180 tablet 3  . cholecalciferol (VITAMIN D3) 25 MCG (1000 UT) tablet Take 1,000 Units by mouth daily.    . clopidogrel (PLAVIX) 75 MG tablet Take 1 tablet (75 mg total) by mouth daily. 90 tablet 1  . dicyclomine (BENTYL) 10 MG capsule TAKE 1 CAPSULE BY MOUTH 4 TIMES DAILY (Patient taking differently: Take 10 mg by mouth 4 (four) times daily. ) 120 capsule 0  . diphenoxylate-atropine (LOMOTIL) 2.5-0.025 MG tablet Take 1 tablet by mouth 2 (two) times daily as needed for diarrhea or loose stools. 30 tablet 3  . gabapentin (NEURONTIN) 300 MG capsule Take 300 mg by mouth at bedtime.    Marland Kitchen glimepiride (AMARYL) 4 MG tablet Take 4 mg by mouth 2 (two) times daily.    . insulin NPH Human (HUMULIN N,NOVOLIN N) 100 UNIT/ML injection Inject 10-40 Units into the skin 2 (two) times daily.     . insulin regular (NOVOLIN R) 100 units/mL injection Inject 10-40 Units into the skin 2 (two) times daily.     Marland Kitchen ipratropium-albuterol (DUONEB) 0.5-2.5 (3) MG/3ML SOLN Inhale 3 mLs into the lungs 4 (four) times daily as needed for wheezing or shortness of breath.    . metoprolol succinate (TOPROL-XL) 25 MG 24 hr tablet Take 3 tablets (75 mg total) by mouth daily. (Patient taking differently: Take 25 mg by mouth daily. ) 90 tablet 6  . montelukast (SINGULAIR) 10 MG tablet Take 10 mg by mouth at bedtime.    . nitroGLYCERIN (NITROSTAT) 0.4 MG SL tablet Place 1 tablet (0.4 mg total) under the tongue every 5 (five) minutes as needed for chest pain. 25 tablet 3  . oxybutynin (DITROPAN) 5 MG tablet Take 5 mg by mouth at bedtime.     . pantoprazole  (PROTONIX) 40 MG tablet Take 1 tablet (40 mg total) by mouth daily. 30 tablet 6  . Polyvinyl Alcohol-Povidone (REFRESH OP) Place 1 drop into both eyes daily as needed (dry eyes).    . pravastatin (PRAVACHOL) 20 MG tablet Take 1 tablet (20 mg total) by mouth at bedtime. 90 tablet 3  . torsemide (DEMADEX) 20 MG tablet Take 20 mg by mouth daily.    . traMADol (ULTRAM) 50 MG tablet Take 1 tablet (50 mg total) by mouth every 6 (six) hours as needed (pain). 10 tablet 0  . lipase/protease/amylase (CREON) 36000 UNITS CPEP capsule Take 36,000 Units by mouth 3 (three) times daily with meals.     No current facility-administered medications for this visit.  Patient's surgical history, family medical history, social history and allergies were all reviewed in Epic    Physical Exam:     BP 128/80   Pulse 79   Temp 98.2 F (36.8 C)   Ht 5' 6"  (1.676 m)   Wt 233 lb (105.7 kg)   BMI 37.61 kg/m   GENERAL:  Alert, oriented, cooperative, not in acute distress. PSYCH: :Pleasant, normal mood and affect. HEENT:  conjunctiva pink, mucous membranes moist, neck supple without masses. No jaundice. CARDIAC:  S1 S2 normal. No murmers. PULM: Normal respiratory effort, lungs CTA bilaterally, no wheezing. ABDOMEN: Inspection: No visible peristalsis, no abnormal pulsations, skin normal.  Palpation/percussion: Soft, nontender, nondistended, no rigidity, no abnormal dullness to percussion, no hepatosplenomegaly and no palpable abdominal masses.  Multiple incisions with bandage.  No pus. auscultation: Normal bowel sounds, no abdominal bruits. Rectal exam: Deferred SKIN:  turgor, no lesions seen. Musculoskeletal:  Normal muscle tone, normal strength. NEURO: Alert and oriented x 3, some left-sided weakness Hepatic Function Latest Ref Rng & Units 08/27/2019 08/13/2019 08/09/2019  Total Protein 6.5 - 8.1 g/dL 7.4 7.0 7.5  Albumin 3.5 - 5.0 g/dL 3.9 - 4.2  AST 15 - 41 U/L 35 47(H) 23  ALT 0 - 44 U/L 48(H) 43 30  Alk  Phosphatase 38 - 126 U/L 86 - 79  Total Bilirubin 0.3 - 1.2 mg/dL 0.5 0.5 0.5  Bilirubin, Direct 0.00 - 0.40 mg/dL - - -   CBC Latest Ref Rng & Units 08/27/2019 08/13/2019 08/09/2019  WBC 4.0 - 10.5 K/uL 11.4(H) 10.9(H) 12.7(H)  Hemoglobin 13.0 - 17.0 g/dL 12.3(L) 12.6(L) 12.8(L)  Hematocrit 39.0 - 52.0 % 39.6 38.5(L) 39.0  Platelets 150 - 400 K/uL 270 313.0 286.0   Lipase     Component Value Date/Time   LIPASE 8.0 (L) 07/22/2019 1103  25 minutes spent with the patient today. Greater than 50% was spent in counseling and coordination of care with the patient     Nyiesha Beever,MD 09/02/2019, 9:45 AM

## 2019-09-11 ENCOUNTER — Other Ambulatory Visit: Payer: Self-pay | Admitting: Cardiovascular Disease

## 2019-09-11 DIAGNOSIS — E7849 Other hyperlipidemia: Secondary | ICD-10-CM

## 2019-09-29 ENCOUNTER — Inpatient Hospital Stay
Admission: AD | Admit: 2019-09-29 | Payer: Medicare HMO | Source: Other Acute Inpatient Hospital | Admitting: Internal Medicine

## 2019-09-30 DIAGNOSIS — Y838 Other surgical procedures as the cause of abnormal reaction of the patient, or of later complication, without mention of misadventure at the time of the procedure: Secondary | ICD-10-CM | POA: Diagnosis not present

## 2019-09-30 DIAGNOSIS — I10 Essential (primary) hypertension: Secondary | ICD-10-CM

## 2019-09-30 DIAGNOSIS — I2510 Atherosclerotic heart disease of native coronary artery without angina pectoris: Secondary | ICD-10-CM

## 2019-09-30 DIAGNOSIS — K651 Peritoneal abscess: Secondary | ICD-10-CM

## 2019-09-30 DIAGNOSIS — K9189 Other postprocedural complications and disorders of digestive system: Secondary | ICD-10-CM | POA: Diagnosis not present

## 2019-09-30 DIAGNOSIS — R531 Weakness: Secondary | ICD-10-CM

## 2019-10-01 DIAGNOSIS — Y838 Other surgical procedures as the cause of abnormal reaction of the patient, or of later complication, without mention of misadventure at the time of the procedure: Secondary | ICD-10-CM | POA: Diagnosis not present

## 2019-10-01 DIAGNOSIS — K9189 Other postprocedural complications and disorders of digestive system: Secondary | ICD-10-CM | POA: Diagnosis not present

## 2019-10-01 DIAGNOSIS — K651 Peritoneal abscess: Secondary | ICD-10-CM | POA: Diagnosis not present

## 2019-10-01 DIAGNOSIS — R531 Weakness: Secondary | ICD-10-CM | POA: Diagnosis not present

## 2019-10-02 DIAGNOSIS — Y838 Other surgical procedures as the cause of abnormal reaction of the patient, or of later complication, without mention of misadventure at the time of the procedure: Secondary | ICD-10-CM | POA: Diagnosis not present

## 2019-10-02 DIAGNOSIS — K9189 Other postprocedural complications and disorders of digestive system: Secondary | ICD-10-CM | POA: Diagnosis not present

## 2019-10-02 DIAGNOSIS — K651 Peritoneal abscess: Secondary | ICD-10-CM | POA: Diagnosis not present

## 2019-10-02 DIAGNOSIS — R531 Weakness: Secondary | ICD-10-CM | POA: Diagnosis not present

## 2019-10-19 ENCOUNTER — Emergency Department (HOSPITAL_COMMUNITY)
Admission: EM | Admit: 2019-10-19 | Discharge: 2019-10-20 | Disposition: A | Discharge status: Home / Self Care | Payer: Medicare HMO | Attending: Emergency Medicine | Admitting: Emergency Medicine

## 2019-10-19 ENCOUNTER — Emergency Department (HOSPITAL_COMMUNITY): Payer: Medicare HMO

## 2019-10-19 ENCOUNTER — Other Ambulatory Visit: Payer: Self-pay

## 2019-10-19 ENCOUNTER — Encounter (HOSPITAL_COMMUNITY): Payer: Self-pay | Admitting: Emergency Medicine

## 2019-10-19 DIAGNOSIS — R110 Nausea: Secondary | ICD-10-CM | POA: Diagnosis present

## 2019-10-19 DIAGNOSIS — R197 Diarrhea, unspecified: Secondary | ICD-10-CM | POA: Insufficient documentation

## 2019-10-19 DIAGNOSIS — Z5321 Procedure and treatment not carried out due to patient leaving prior to being seen by health care provider: Secondary | ICD-10-CM | POA: Insufficient documentation

## 2019-10-19 DIAGNOSIS — R509 Fever, unspecified: Secondary | ICD-10-CM | POA: Diagnosis not present

## 2019-10-19 LAB — CBC WITH DIFFERENTIAL/PLATELET
Abs Immature Granulocytes: 0.05 10*3/uL (ref 0.00–0.07)
Basophils Absolute: 0 10*3/uL (ref 0.0–0.1)
Basophils Relative: 0 %
Eosinophils Absolute: 0.2 10*3/uL (ref 0.0–0.5)
Eosinophils Relative: 1 %
HCT: 37.1 % — ABNORMAL LOW (ref 39.0–52.0)
Hemoglobin: 11.4 g/dL — ABNORMAL LOW (ref 13.0–17.0)
Immature Granulocytes: 0 %
Lymphocytes Relative: 16 %
Lymphs Abs: 2 10*3/uL (ref 0.7–4.0)
MCH: 28.7 pg (ref 26.0–34.0)
MCHC: 30.7 g/dL (ref 30.0–36.0)
MCV: 93.5 fL (ref 80.0–100.0)
Monocytes Absolute: 1.2 10*3/uL — ABNORMAL HIGH (ref 0.1–1.0)
Monocytes Relative: 10 %
Neutro Abs: 9.2 10*3/uL — ABNORMAL HIGH (ref 1.7–7.7)
Neutrophils Relative %: 73 %
Platelets: 305 10*3/uL (ref 150–400)
RBC: 3.97 MIL/uL — ABNORMAL LOW (ref 4.22–5.81)
RDW: 15.9 % — ABNORMAL HIGH (ref 11.5–15.5)
WBC: 12.7 10*3/uL — ABNORMAL HIGH (ref 4.0–10.5)
nRBC: 0 % (ref 0.0–0.2)

## 2019-10-19 LAB — URINALYSIS, ROUTINE W REFLEX MICROSCOPIC
Bacteria, UA: NONE SEEN
Bilirubin Urine: NEGATIVE
Glucose, UA: >=500 mg/dL — AB
Ketones, ur: NEGATIVE mg/dL
Leukocytes,Ua: NEGATIVE
Nitrite: NEGATIVE
Protein, ur: 100 mg/dL — AB
Specific Gravity, Urine: 1.018 (ref 1.005–1.030)
pH: 5 (ref 5.0–8.0)

## 2019-10-19 NOTE — ED Triage Notes (Signed)
Patient reports nausea , diarrhea , fatigue and low grade fever today , denies emesis or chills .

## 2019-10-20 ENCOUNTER — Emergency Department (HOSPITAL_COMMUNITY): Payer: Medicare HMO

## 2019-10-20 LAB — BASIC METABOLIC PANEL
Anion gap: 11 (ref 5–15)
BUN: 8 mg/dL (ref 6–20)
CO2: 24 mmol/L (ref 22–32)
Calcium: 8.8 mg/dL — ABNORMAL LOW (ref 8.9–10.3)
Chloride: 105 mmol/L (ref 98–111)
Creatinine, Ser: 0.86 mg/dL (ref 0.61–1.24)
GFR calc Af Amer: >60 mL/min (ref >60)
GFR calc non Af Amer: >60 mL/min (ref >60)
Glucose, Bld: 201 mg/dL — ABNORMAL HIGH (ref 70–99)
Potassium: 4 mmol/L (ref 3.5–5.1)
Sodium: 140 mmol/L (ref 135–145)

## 2019-10-20 NOTE — ED Notes (Signed)
Pt did not respond when called for vitals check and xray states she has called on pt twice with no response as well

## 2020-03-13 ENCOUNTER — Other Ambulatory Visit: Payer: Self-pay | Admitting: Cardiovascular Disease

## 2020-03-13 DIAGNOSIS — E7849 Other hyperlipidemia: Secondary | ICD-10-CM

## 2020-03-15 DIAGNOSIS — R413 Other amnesia: Secondary | ICD-10-CM

## 2020-03-15 HISTORY — DX: Other amnesia: R41.3

## 2020-03-17 ENCOUNTER — Telehealth: Payer: Self-pay | Admitting: Cardiovascular Disease

## 2020-03-17 NOTE — Telephone Encounter (Signed)
Okay with me 

## 2020-03-17 NOTE — Telephone Encounter (Signed)
New Message    Pt would like to change from Dr Allyson Sabal to Dr Dulce Sellar due to wanting to see a local a cardiologist near him    Please advise

## 2020-03-18 NOTE — Telephone Encounter (Signed)
Fine with me

## 2020-03-19 ENCOUNTER — Other Ambulatory Visit: Payer: Self-pay | Admitting: Gastroenterology

## 2020-03-26 DIAGNOSIS — R0600 Dyspnea, unspecified: Secondary | ICD-10-CM

## 2020-03-26 DIAGNOSIS — N179 Acute kidney failure, unspecified: Secondary | ICD-10-CM

## 2020-03-26 DIAGNOSIS — R0609 Other forms of dyspnea: Secondary | ICD-10-CM

## 2020-03-26 HISTORY — DX: Acute kidney failure, unspecified: N17.9

## 2020-03-26 HISTORY — DX: Other forms of dyspnea: R06.09

## 2020-03-26 HISTORY — DX: Dyspnea, unspecified: R06.00

## 2020-03-27 DIAGNOSIS — Q231 Congenital insufficiency of aortic valve: Secondary | ICD-10-CM

## 2020-03-27 DIAGNOSIS — Q2381 Bicuspid aortic valve: Secondary | ICD-10-CM

## 2020-03-27 HISTORY — DX: Bicuspid aortic valve: Q23.81

## 2020-03-27 HISTORY — DX: Congenital insufficiency of aortic valve: Q23.1

## 2020-04-05 ENCOUNTER — Other Ambulatory Visit: Payer: Self-pay

## 2020-04-06 ENCOUNTER — Telehealth: Payer: Self-pay | Admitting: Cardiology

## 2020-04-06 NOTE — Progress Notes (Signed)
Cardiology Office Note:    Date:  04/07/2020   ID:  Johnathan Keith, DOB August 15, 1961, MRN [id number]  PCP:  Shelle Iron, MD  Cardiologist:  Norman Herrlich, MD    Referring MD: Dr Sol Passer   ASSESSMENT:    1. 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 Hypertensive heart disease with chronic combined systolic and diastolic congestive heart failure (HCC)   3. Coronary artery disease of native artery of native heart with stable angina pectoris (HCC)   4. Mixed hyperlipidemia   5. Bicuspid aortic valve   6. Diabetic polyneuropathy associated with type 2 diabetes mellitus (HCC)   7. Bradycardia   8. Other hyperlipidemia    PLAN:    In order of problems listed above:  1. Very complex case he has multiple mechanisms for his edema including heart failure with diabetes and hypertension pseudoheart failure due to sleep apnea and side effect of gabapentin.  Loop diuretics more affected twice daily and office changes torsemide to 40 twice daily continue metolazone and asked him to start to record weight daily.  I will see back in few weeks and recheck renal function.  I have asked for records from his pulmonary physician as there is some mention in cardiology records at [facility] that he has severe underlying lung disease and if he cannot tolerate CPAP perhaps referral to ENT for surgical intervention would be helpful with its the mechanism of severe of heart failure. 2. Stable BP at target continue current treatment 3. Stable CAD EKG today is normal having no angina I will continue medical therapy restart a statin at this time I do not think he requires an ischemia evaluation 4. Latest echocardiogram does not show a bicuspid valve no murmur on exam. 5. He does not appear to have diabetic kidney disease and urine dipsticks.   Next appointment: 3 weeks   Medication Adjustments/Labs and Tests Ordered: Current medicines are reviewed at length with the  patient today.  Concerns regarding medicines are outlined above.  Orders Placed This Encounter  Procedures  . EKG 12-Lead   Meds ordered this encounter  Medications  . pravastatin (PRAVACHOL) 20 MG tablet    Sig: Take 1 tablet (20 mg total) by mouth at bedtime.    Dispense:  90 tablet    Refill:  3    Chief Complaint  Patient presents with  . Follow-up    History of CAD, bicuspid aortic valve left ventricular dysfunction reduced ejection fraction 40 to 45%  . Leg Swelling    C/O chest pressure    History of Present Illness:    Johnathan Keith is a 59 y.o. male with a hx of hypertension carotid disease with right carotid endarterectomy   Other problems include developing hyperlipidemia and diabetes.  Coronary angiography was performed 09/29/2017 showed an 80% mid LAD stenosis with PCI and previous stent drug-eluting stent 2016 proximal mid LAD patent.  Sounds like he has had heart failure in the past he has been on high-dose loop diuretic however there is no diagnosis of congestive heart failure in his medical record.echocardiogram performed during admission for stroke and sepsis 05/10/2019 showed a reduced ejection fraction 40 to 45% and elevated troponin possibly due to demand ischemia.  He was not seen in consultation by cardiology. He was last seen 09 /09/2018 Dr. Gery Pray after a lacunar stroke admission at [facility].  Compliance with diet, lifestyle and medications: Yes  Chart review shows that he underwent a  right heart catheterization at [facility] 04/14/2019 showing a pulmonary capillary wedge pressure of 10 normal cardiac output and mildly elevated right heart pressures RV 35/10 PA 33/12.  Echocardiogram performed at [facility] 12/09/2018 showed ejection fraction greater than 55% mild aortic and tricuspid regurgitation.  Chart review shows an echocardiogram at [facility] 05/10/2019 showing ejection fraction 40 to 45% with diffuse hypokinesia and he is risk valve is  described as bicuspid but no stenosis and there is mild aortic regurgitation.  I personally reviewed the EKG and agree with the interpretation.    He was seen by his primary care physician 03/31/2020 for hypertension and type 2 diabetes is advised to reestablish care with cardiology.  Recent labs 03/31/2020: Hemoglobin A1c severely elevated 12.6 CMP potassium 4.2 creatinine 1.24 GFR 63 cc normal liver function. Lipid profile 12/09/2018 cholesterol 114 triglycerides 106 HDL 21 LDL 72  Record review [facility] chest CT 12/20/2019 performed chest cough and shortness of breath showed no evidence of pulmonary embolism he had small left and trace right pleural effusion glass density through the lungs that may represent heart failure and  lymphadenopathy likely reactive.  In addition he is cared for by Dr. Rachael Darby pulmonary in [facility] for some chronic lung problem is unsure the name and obstructive sleep apnea CPAP intolerant he wears nocturnal oxygen especially when he hallucinates and wears it during the day when he is breathless and weak.  In total he has gained about 65 pounds in the last 1 to 2 years and 2 days ago with switch from furosemide to torsemide.  As it takes gabapentin that causes profound sodium retention for his diabetic polyneuropathy he does not appear to have diabetic kidney disease.  He is chronically short of breath for anything more than usual activities he does not have orthopnea chest pain palpitation or syncope.  He does not weigh daily but he is supervised with his medications by the family they distribute any is sodium restricted. Past Medical History:  Diagnosis Date  . Abdominal distention   . Abnormal CT scan 12/09/2018  . Abnormal nuclear cardiac imaging test    High-risk nuclear stress test   . Acute febrile illness 05/08/2019  . Acute pancreatitis 05/08/2019  . AKI (acute kidney injury) (HCC) 03/26/2020  . Arthritis   . Arthropathy of cervical facet joint 01/31/2019   . Bicuspid aortic valve 03/27/2020   Formatting of this note might be different from the original. 2020: ECHO  . BPH with obstruction/lower urinary tract symptoms 02/20/2016  . CAD -S/P PCI-2016 11/30/2015   LAD DES 2016. Admitted to [facility] Sept 2017 with SOB, not felt to be in CHF, Myoview was negative for sichemia  Formatting of this note might be different from the original. Overview:  LAD DES 2016. Admitted to [facility] Sept 2017 with SOB, not felt to be in CHF, Myoview was negative for sichemia  Last Assessment & Plan:  History of CAD status post LAD stenting by myself in 2016 with re-intervention because o  . Carotid artery stenosis- 09/26/2014   Rt CEA Oct 2015-Dr Chen  . Carotid stenosis 04/07/2013   Rt CEA Oct 2015-Dr Chen  Last Assessment & Plan:  History of carotid artery disease status post right carotid endarterectomy by Dr. [name] October 2015 which he follows by duplex ultrasound. Formatting of this note might be different from the original. Rt CEA Oct 2015-Dr [name]  Last Assessment & Plan:  History of carotid artery disease status post right carotid endarterectomy by Dr.  Chen October 2015  . Carpal tunnel syndrome 07/17/2015  . 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 Cerebrovascular accident (CVA) due to thrombosis of right middle cerebral artery (HCC) 07/04/2014   Questionable small stroke in 2014 with transient Lt sided weakness in setting of carotid disease.  Formatting of this note might be different from the original. Overview:  Questionable small stroke in 2014 with transient Lt sided weakness in setting of carotid disease.  Last Assessment & Plan:  History of multiple strokes in the past dating back to 2014 with a recent stroke in April of this year a  . Cervical spondylosis without myelopathy 01/31/2019  . Cholecystitis 06/24/2019  . Cholelithiases 12/09/2018  . Chronic diarrhea 12/09/2018  . Chronic neck and back pain   . Clostridium difficile diarrhea     negative culture 08/17/19  . Coronary artery disease    drug-eluting stents placed in proximal and mid LAD  . Coronary artery disease with history of myocardial infarction without history of CABG 04/07/2013  . DDD (degenerative disc disease), cervical 01/31/2019  . Degenerative joint disease of thoracic spine 12/09/2018  . Deviated septum   . Diabetes mellitus type 2, uncontrolled (HCC) 04/07/2013  . Diabetic polyneuropathy associated with type 2 diabetes mellitus (HCC) 01/31/2019  . Diabetic retinopathy (HCC)   . Dyspnea on exertion 03/26/2020   Dyspnea on exertion  Formatting of this note might be different from the original. Overview:  Dyspnea on exertion  Last Assessment & Plan:  Improved after stenting of his LAD  . Elevated troponin 12/09/2018  . Erectile dysfunction 11/25/2015  . Essential hypertension 04/07/2013   hypertension  Formatting of this note might be different from the original. hypertension  Last Assessment & Plan:  History of essential hypertension her blood pressure measured at 162/70.  He is on benazepril 10 the morning and 20 in the evening as well as hydralazine, hydrochlorothiazide, and metoprolol.  Blood pressures have been on the high side.  I am going to increase his benazepril to 20 mg   . GERD (gastroesophageal reflux disease)    "occasionally" (03/23/2015)  . Headache    "more than 2/wk" (03/23/2015)  . Heart attack (HCC)    "in 1997 was told I had signs of a small heart attack that I didn't know I'd had"  . High cholesterol   . History of cardioembolic cerebrovascular accident (CVA) 12/09/2018  . History of gout    "when I was younger"  . History of kidney stones    passed  . History of stroke 04/20/2018  . Hyperlipidemia, unspecified 04/07/2013   hyperlipidemia  Formatting of this note might be different from the original. IMO routine update IMO routine update hyperlipidemia  Last Assessment & Plan:  History of hyperlipidemia on pravastatin with lipid profile  performed by his PCP 03/17/2018 revealing total cholesterol 126, LDL 55 HDL 24. Formatting of this note might be different from the original. hyperlipidemia  Last Assessment & Plan:  Hi  . Hypertension   . 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 Kidney disease   . Large liver 12/09/2018  . Lower back pain 06/14/2015  . Lumbar facet joint pain 06/14/2015  . Lumbar foraminal stenosis 01/31/2019  . Lymphocytosis 05/24/2015  . Memory loss 03/15/2020  . Migraine    "had my 1st and only in 12/2014" (03/23/2015)  . Neural foraminal stenosis of cervical spine 01/31/2019  . Neutrophilia 05/24/2015  . Non morbid obesity due to excess calories 02/12/2016  .  Occipital neuralgia of right side 12/18/2016  . Occlusion and stenosis of unspecified carotid artery 04/07/2013   Formatting of this note might be different from the original. Overview:  Rt CEA Oct 2015-Dr [name]  Last Assessment & Plan:  History of carotid artery disease status post right carotid endarterectomy by Dr. [name] October 2015 which he follows by duplex ultrasound.  . Peripheral neuropathy    feet  . Peripheral vascular disease (HCC)    carotid artery disease  . Pneumonia 11/19,1/20  . PONV (postoperative nausea and vomiting) 2015   "was told I was confused and combative in recovery from the narcotics w/my carotid OR"  "During a colonoscopy my oxygen level dropped so low that I had to be woke up.- 03/2017 colonoscopy [facility]   . Sepsis (HCC) 05/08/2019  . Serum total bilirubin elevated 05/08/2019  . Sleep apnea    "severe; couldn't afford mask" (03/23/2015) uses O2 at night.no C-pap  . SOB (shortness of breath) 05/04/2019  . Spondylosis of lumbar spine 01/31/2019  . Stroke [facility]) 2014   "on walker for 2 months; left side is weaker and numb since" (03/23/2015), "2 confirmed strokes"  . Stroke (HCC) 05/10/2019   Aphasia mild  . Stroke, lacunar (HCC) 04/07/2013  . Transaminitis 05/08/2019  . Type 2 diabetes mellitus with diabetic neuropathy (HCC) 12/09/2018  .  Type 2 diabetes mellitus with diabetic neuropathy, with long-term current use of insulin (HCC) 07/04/2014  . Type 2 diabetes mellitus, with long-term current use of insulin (HCC) 02/12/2016  . Type II diabetes mellitus (HCC)     Past Surgical History:  Procedure Laterality Date  . BILIARY DILATION  08/11/2019   Procedure: BILIARY DILATION;  Surgeon: Meridee Score Netty Starring., MD;  Location: [facility] ENDOSCOPY;  Service: Gastroenterology;;  . BIOPSY  08/11/2019   Procedure: BIOPSY;  Surgeon: Lemar Lofty., MD;  Location: [facility] ENDOSCOPY;  Service: Gastroenterology;;  . CHOLECYSTECTOMY N/A 08/31/2019   Procedure: LAPAROSCOPIC CHOLECYSTECTOMY;  Surgeon: Gaynelle Adu, MD;  Location: [facility] ORS;  Service: General;  Laterality: N/A;  . CIRCUMCISION  1981  . COLONOSCOPY W/ POLYPECTOMY    . CORONARY ANGIOPLASTY WITH STENT PLACEMENT  03/23/2015   "2"  . CORONARY STENT INTERVENTION N/A 09/29/2017   Procedure: CORONARY STENT INTERVENTION;  Surgeon: Runell Gess, MD;  Location: [facility] INVASIVE CV LAB;  Service: Cardiovascular;  Laterality: N/A;  . ENDARTERECTOMY Right 09/26/2014   Procedure: RIGHT CAROTID ENDARTERECTOMY WITH PATCH ANGIOPLASTY;  Surgeon: Fransisco Hertz, MD;  Location: [facility] OR;  Service: Vascular;  Laterality: Right;  . ENDOSCOPIC RETROGRADE CHOLANGIOPANCREATOGRAPHY (ERCP) WITH PROPOFOL N/A 08/11/2019   Procedure: ENDOSCOPIC RETROGRADE CHOLANGIOPANCREATOGRAPHY (ERCP) WITH PROPOFOL;  Surgeon: Lemar Lofty., MD;  Location: [facility] ENDOSCOPY;  Service: Gastroenterology;  Laterality: N/A;  . ESOPHAGOGASTRODUODENOSCOPY (EGD) WITH PROPOFOL N/A 08/11/2019   Procedure: ESOPHAGOGASTRODUODENOSCOPY (EGD) WITH PROPOFOL;  Surgeon: Meridee Score Netty Starring., MD;  Location: [facility] ENDOSCOPY;  Service: Gastroenterology;  Laterality: N/A;  . EYE SURGERY Right May 2016   Cataract  . EYE SURGERY Left July 2016   Cataract  . HYDROCELE EXCISION  ~ 2008  . IR EXCHANGE BILIARY DRAIN  07/21/2019  . IR PERC CHOLECYSTOSTOMY   05/11/2019  . IR RADIOLOGIST EVAL & MGMT  06/30/2019  . LEFT HEART CATH AND CORONARY ANGIOGRAPHY N/A 09/29/2017   Procedure: LEFT HEART CATH AND CORONARY ANGIOGRAPHY;  Surgeon: Runell Gess, MD;  Location: [facility] INVASIVE CV LAB;  Service: Cardiovascular;  Laterality: N/A;  . LEFT HEART CATHETERIZATION WITH CORONARY ANGIOGRAM N/A 03/23/2015  Procedure: LEFT HEART CATHETERIZATION WITH CORONARY ANGIOGRAM;  Surgeon: Lorretta Harp, MD;  Location: [facility] CATH LAB;  Service: Cardiovascular;  Laterality: N/A;  . REFRACTIVE SURGERY Bilateral 08/2014-09/2014   Diabetic retinopathy   . REMOVAL OF STONES  08/11/2019   Procedure: REMOVAL OF STONES;  Surgeon: Rush Landmark Telford Nab., MD;  Location: [place];  Service: Gastroenterology;;  . Azzie Almas DILATION N/A 08/11/2019   Procedure: Azzie Almas DILATION;  Surgeon: Irving Copas., MD;  Location: [place];  Service: Gastroenterology;  Laterality: N/A;  . SPHINCTEROTOMY  08/11/2019   Procedure: SPHINCTEROTOMY;  Surgeon: Rush Landmark Telford Nab., MD;  Location: [place];  Service: Gastroenterology;;    Current Medications: Current Meds  Medication Sig  . ALPRAZolam (XANAX) 0.25 MG tablet Take 0.25 mg by mouth at bedtime as needed for anxiety.  Marland Kitchen aspirin 81 MG tablet Take 1 tablet (81 mg total) by mouth daily.  . bacitracin ointment bacitracin 500 unit/gram topical packet  APPLY OINTMENT EXTERNALLY TWICE DAILY TO WOUNDS  . cholecalciferol (VITAMIN D3) 25 MCG (1000 UT) tablet Take 1,000 Units by mouth daily.  . clopidogrel (PLAVIX) 75 MG tablet Take 1 tablet (75 mg total) by mouth daily.  . Fluticasone-Umeclidin-Vilant (TRELEGY ELLIPTA) 100-62.5-25 MCG/INH AEPB Inhale 1 puff into the lungs daily as needed.  . gabapentin (NEURONTIN) 300 MG capsule Take 300 mg by mouth at bedtime.  . insulin NPH Human (NOVOLIN N) 100 UNIT/ML injection Inject 55 Units into the skin 2 (two) times daily.  . insulin regular (NOVOLIN R) 100 units/mL injection Inject 55 Units  into the skin 2 (two) times daily. Pt is on a sliding scale  . ipratropium-albuterol (DUONEB) 0.5-2.5 (3) MG/3ML SOLN Inhale 3 mLs into the lungs 4 (four) times daily as needed for wheezing or shortness of breath.  . loperamide (IMODIUM A-D) 2 MG tablet Imodium A-D 2 mg tablet  use 1 pill after each bowel moement up to 4 daily  . metoprolol succinate (TOPROL-XL) 50 MG 24 hr tablet Take 50 mg by mouth daily.  . montelukast (SINGULAIR) 10 MG tablet Take 10 mg by mouth at bedtime.  . nitroGLYCERIN (NITROSTAT) 0.4 MG SL tablet Place 1 tablet (0.4 mg total) under the tongue every 5 (five) minutes as needed for chest pain.  Marland Kitchen oxybutynin (DITROPAN) 5 MG tablet Take 5 mg by mouth at bedtime.   . pantoprazole (PROTONIX) 40 MG tablet Take 1 tablet (40 mg total) by mouth daily.  . pravastatin (PRAVACHOL) 20 MG tablet Take 1 tablet (20 mg total) by mouth at bedtime.  . torsemide (DEMADEX) 20 MG tablet Take 20 mg by mouth daily.  . [DISCONTINUED] pravastatin (PRAVACHOL) 20 MG tablet Take 1 tablet (20 mg total) by mouth at bedtime.     Allergies:   Patient has no known allergies.   Social History   Socioeconomic History  . Marital status: Married    Spouse name: Not on file  . Number of children: 5  . Years of education: 12th  . Highest education level: Not on file  Occupational History  . Occupation: Disabled  Tobacco Use  . Smoking status: Never Smoker  . Smokeless tobacco: Never Used  Substance and Sexual Activity  . Alcohol use: No    Alcohol/week: 0.0 standard drinks  . Drug use: No  . Sexual activity: Not Currently    Partners: Female  Other Topics Concern  . Not on file  Social History Narrative   Patient is married with 5 children.    Patient is right  handed.   Patient has hs education.   Patient drinks 4 12 oz can sodas daily.   Social Determinants of Health   Financial Resource Strain:   . Difficulty of Paying Living Expenses:   Food Insecurity:   . Worried About Community education officer in the Last Year:   . Barista in the Last Year:   Transportation Needs:   . Freight forwarder (Medical):   Marland Kitchen Lack of Transportation (Non-Medical):   Physical Activity:   . Days of Exercise per Week:   . Minutes of Exercise per Session:   Stress:   . Feeling of Stress :   Social Connections:   . Frequency of Communication with Friends and Family:   . Frequency of Social Gatherings with Friends and Family:   . Attends Religious Services:   . Active Member of Clubs or Organizations:   . Attends Banker Meetings:   Marland Kitchen Marital Status:      Family History: The patient's family history includes Asthma in his brother; Cancer in his father; Colon cancer in his brother and sister; Diabetes in his brother, daughter, maternal grandfather, and mother; Gout in his brother and maternal uncle; Heart attack in his father; Heart disease in his father and mother; Hyperlipidemia in his brother, mother, and sister; Hypertension in his brother, mother, and sister; Stroke in his paternal grandfather. ROS:   Please see the history of present illness.    All other systems reviewed and are negative.  EKGs/Labs/Other Studies Reviewed:    The following studies were reviewed today:  EKG:  EKG ordered today and personally reviewed.  The ekg ordered today demonstrates sinus rhythm the EKG is normal.  Rate 72 bpm  Recent Labs: 05/08/2019: TSH 1.714 05/09/2019: B Natriuretic Peptide 223.8 05/16/2019: Magnesium 2.1 08/27/2019: ALT 48 10/19/2019: BUN 8; Creatinine, Ser 0.86; Hemoglobin 11.4; Platelets 305; Potassium 4.0; Sodium 140  Recent Lipid Panel    Component Value Date/Time   CHOL 146 08/27/2017 1039   TRIG 119 08/27/2017 1039   HDL 34 (L) 08/27/2017 1039   CHOLHDL 4.3 08/27/2017 1039   CHOLHDL 3.9 03/22/2015 1136   VLDL 44 (H) 03/22/2015 1136   LDLCALC 88 08/27/2017 1039    Physical Exam:    VS:  BP 128/60 (BP Location: Right Arm, Patient Position: Sitting, Cuff  Size: Normal)   Pulse (!) 40   Temp (!) 97.1 F (36.2 C)   Resp 18   Ht 5\' 6"  (1.676 m)   Wt 245 lb 8 oz (111.4 kg)   SpO2 98%   BMI 39.62 kg/m     Wt Readings from Last 3 Encounters:  04/07/20 245 lb 8 oz (111.4 kg)  09/02/19 233 lb (105.7 kg)  08/31/19 233 lb 11.2 oz (106 kg)     GEN: He has anasarca and looks chronically ill well nourished, well developed in no acute distress HEENT: Normal NECK: No JVD; No carotid bruits LYMPHATICS: No lymphadenopathy CARDIAC: Distant heart sounds RRR, no murmurs, rubs, gallops RESPIRATORY:  Clear to auscultation without rales, wheezing or rhonchi  ABDOMEN: Soft, non-tender, non-distended MUSCULOSKELETAL:  No edema; No deformity  SKIN: Warm and dry NEUROLOGIC:  Alert and oriented x 3 PSYCHIATRIC:  Normal affect    Signed, Norman Herrlich, MD  04/07/2020 9:46 AM    [facility]

## 2020-04-06 NOTE — Telephone Encounter (Signed)
New Message ° ° ° °Pts wife is returning a call  ° °Please call back  °

## 2020-04-06 NOTE — Telephone Encounter (Signed)
Spoke to the patients wife just now. She states that they received a call from [place], [place]. I let them know that she was probably calling to go over his medications with them prior to his appointment on 04/07/20. The wife states that they do have an updated list at home that they will bring to the appointment but they are at work today and will not get off until 5pm. I asked that they please bring that updated list of medications to the appointment tomorrow. They verbalize understanding and no other issues or concerns were noted.    Encouraged patient to call back with any questions or concerns.

## 2020-04-07 ENCOUNTER — Other Ambulatory Visit: Payer: Self-pay

## 2020-04-07 ENCOUNTER — Encounter: Payer: Self-pay | Admitting: Cardiology

## 2020-04-07 ENCOUNTER — Ambulatory Visit (INDEPENDENT_AMBULATORY_CARE_PROVIDER_SITE_OTHER): Payer: Medicare HMO | Admitting: Cardiology

## 2020-04-07 VITALS — BP 128/60 | HR 40 | Temp 97.1°F | Resp 18 | Ht 66.0 in | Wt 245.5 lb

## 2020-04-07 DIAGNOSIS — I5042 Chronic combined systolic (congestive) and diastolic (congestive) heart failure: Secondary | ICD-10-CM

## 2020-04-07 DIAGNOSIS — E7849 Other hyperlipidemia: Secondary | ICD-10-CM

## 2020-04-07 DIAGNOSIS — E782 Mixed hyperlipidemia: Secondary | ICD-10-CM | POA: Diagnosis not present

## 2020-04-07 DIAGNOSIS — E1142 Type 2 diabetes mellitus with diabetic polyneuropathy: Secondary | ICD-10-CM

## 2020-04-07 DIAGNOSIS — R001 Bradycardia, unspecified: Secondary | ICD-10-CM

## 2020-04-07 DIAGNOSIS — I25118 Atherosclerotic heart disease of native coronary artery with other forms of angina pectoris: Secondary | ICD-10-CM | POA: Diagnosis not present

## 2020-04-07 DIAGNOSIS — I38 Endocarditis, valve unspecified: Secondary | ICD-10-CM

## 2020-04-07 DIAGNOSIS — I110 Hypertensive heart disease with heart failure: Secondary | ICD-10-CM

## 2020-04-07 DIAGNOSIS — Q231 Congenital insufficiency of aortic valve: Secondary | ICD-10-CM

## 2020-04-07 HISTORY — DX: Bradycardia, unspecified: R00.1

## 2020-04-07 MED ORDER — PRAVASTATIN SODIUM 20 MG PO TABS: 20.0000 mg | ORAL_TABLET | Freq: Every day | ORAL | 3 refills | Status: DC

## 2020-04-07 NOTE — Addendum Note (Signed)
Addended by: Eleonore Chiquito on: 04/07/2020 11:00 AM   Modules accepted: Orders

## 2020-04-07 NOTE — Patient Instructions (Addendum)
Medication Instructions:  Your physician has recommended you make the following change in your medication:   Increase your Torsemide to 20 mg twice daily.  *If you need a refill on your cardiac medications before your next appointment, please call your pharmacy*   Lab Work: None ordered If you have labs (blood work) drawn today and your tests are completely normal, you will receive your results only by: Marland Kitchen MyChart Message (if you have MyChart) OR . A paper copy in the mail If you have any lab test that is abnormal or we need to change your treatment, we will call you to review the results.   Testing/Procedures: None ordered   Follow-Up: At [facility], you and your health needs are our priority.  As part of our continuing mission to provide you with exceptional heart care, we have created designated Provider Care Teams.  These Care Teams include your primary Cardiologist (physician) and Advanced Practice Providers (APPs -  Physician Assistants and Nurse Practitioners) who all work together to provide you with the care you need, when you need it.  We recommend signing up for the patient portal called "MyChart".  Sign up information is provided on this After Visit Summary.  MyChart is used to connect with patients for Virtual Visits (Telemedicine).  Patients are able to view lab/test results, encounter notes, upcoming appointments, etc.  Non-urgent messages can be sent to your provider as well.   To learn more about what you can do with MyChart, go to [email or web address].    Your next appointment:   3 week(s)  The format for your next appointment:   In Person  Provider:   Norman Herrlich, MD   Other Instructions   Daily weights in the morning after voiding. Please record your weights and bring at your next appointment.

## 2020-04-14 ENCOUNTER — Other Ambulatory Visit: Payer: Self-pay | Admitting: Cardiology

## 2020-04-14 ENCOUNTER — Telehealth: Payer: Self-pay | Admitting: Cardiology

## 2020-04-14 MED ORDER — TORSEMIDE 20 MG PO TABS: 20.0000 mg | ORAL_TABLET | Freq: Every day | ORAL | 3 refills | Status: DC

## 2020-04-14 MED ORDER — TORSEMIDE 20 MG PO TABS: 40.0000 mg | ORAL_TABLET | Freq: Two times a day (BID) | ORAL | 3 refills | Status: DC

## 2020-04-14 NOTE — Telephone Encounter (Signed)
New Message   *STAT* If patient is at the pharmacy, call can be transferred to refill team.   1. Which medications need to be refilled? (please list name of each medication and dose if known) torsemide (DEMADEX) 20 MG tablet  2. Which pharmacy/location (including street and city if local pharmacy) is medication to be sent to? [facility] - [place], [place] - [address]  3. Do they need a 30 day or 90 day supply? 30 day  Patient is out of medication.

## 2020-04-14 NOTE — Telephone Encounter (Signed)
Spoke to the patients wife just now and also spoke with Dr. Dulce Sellar in regards to this patients issue. Dr. Dulce Sellar said to have the patient take 20 mg of torsemide twice daily. I let the patients wife know this and she verbalizes understanding. No other issues or concerns were noted at this time.    Encouraged patient to call back with any questions or concerns.

## 2020-04-14 NOTE — Telephone Encounter (Signed)
Reviewed the office note from 04/07/20 and it looks like the patient was suppose to be started on torsemide 40 mg twice daily. This prescription was sent [facility] to [facility] per request.

## 2020-04-14 NOTE — Addendum Note (Signed)
Addended by: Delorse Limber I on: 04/14/2020 05:06 PM   Modules accepted: Orders

## 2020-04-14 NOTE — Telephone Encounter (Signed)
Refill sent in per request.  

## 2020-04-14 NOTE — Telephone Encounter (Signed)
   Pt c/o medication issue:  1. Name of Medication:  torsemide (DEMADEX) 20 MG tablet    2. How are you currently taking this medication (dosage and times per day)? Take 1 tablet (20 mg total) by mouth daily.  3. Are you having a reaction (difficulty breathing--STAT)?   4. What is your medication issue? Amil Amen from [facility] calling, she said according to pt Dr. Dulce Sellar increased his dosage to 2 tablets a day. They need new script for this prescriptions  Please advise

## 2020-04-14 NOTE — Telephone Encounter (Signed)
Follow Up  Patient's wife is calling in to get instructions on how patient should be taking the medication. Paperwork that the patient has from appointment on 04/07/20 is different than the instructions on the medication bottle. Please call back to advise.

## 2020-04-17 ENCOUNTER — Other Ambulatory Visit: Payer: Self-pay | Admitting: Gastroenterology

## 2020-05-01 NOTE — Progress Notes (Signed)
Cardiology Office Note:    Date:  05/02/2020   ID:  Johnathan Keith, DOB 1961-09-22, MRN [id number]  PCP:  Algis Greenhouse, MD  Cardiologist:  Shirlee More, MD    Referring MD: Charlotte Sanes, MD    ASSESSMENT:    1. Hypertensive heart disease with chronic combined systolic and diastolic congestive heart failure ([place])   2. Coronary artery disease of native artery of native heart with stable angina pectoris ([place])    PLAN:    In order of problems listed above:  1. His heart failure remains decompensated will double his torsemide continue metolazone check renal function proBNP for safety reassess in 2 weeks and may require inpatient care for heart failure.  I asked him to try not to take his gabapentin a pleasant can sodium restriction and his daughter will supervise altering his diet which is high in sodium.  We will continue to monitor his weight daily.  Blood pressure target continue his current antihypertensive regimen including beta-blocker 2. Stable CAD no angina New York Heart Association class I 3. Stable hyperlipidemia continue statin   Next appointment: 2 weeks   Medication Adjustments/Labs and Tests Ordered: Current medicines are reviewed at length with the patient today.  Concerns regarding medicines are outlined above.  No orders of the defined types were placed in this encounter.  No orders of the defined types were placed in this encounter.   Chief Complaint  Patient presents with  . Follow-up    3 WK FU     History of Present Illness:    Johnathan Keith is a 59 y.o. male with a hx of hypertension carotid disease with right carotid endarterectomy   Other problems include developing hyperlipidemia and diabetes.  Coronary angiography was performed 09/29/2017 showed an 80% mid LAD stenosis with PCI and previous stent drug-eluting stent 2016 proximal mid LAD patent. It appears that  he has had heart failure in the past as he has been on high-dose loop diuretic however  there is no diagnosis of congestive heart failure in his medical record.  Echocardiogram performed during admission for stroke and sepsis 05/10/2019 showed a reduced ejection fraction 40 to 45% and elevated troponin possibly due to demand ischemia.  He was not seen in consultation by cardiology. He was last seen 09 /09/2018 Dr. Alvester Chou after a lacunar stroke admission at [facility].  He was last seen 04/07/3020 with decompensated heart failure. Compliance with diet, lifestyle and medications: Yes  Unfortunately despite all intensifying diuretics his weight is up 10 pounds.  Fortunately his daughter who is an LPN is present.  He is adding salt to his diet with chips and diet colas high in sodium.  I asked him to stop.  He is already taking metolazone every day and a double his loop diuretic for 2 weeks to try to initiate diuresis if ineffective he may require inpatient care.  We will recheck renal function proBNP today and bring back to the office in 2 weeks for follow-up.  He is short of breath with activities but no orthopnea chest pain or syncope.  His renal function is normal and his proBNP level was low not uncommon with diastolic heart failure phenotype with obesity Past Medical History:  Diagnosis Date  . Abdominal distention   . Abnormal CT scan 12/09/2018  . Abnormal nuclear cardiac imaging test    High-risk nuclear stress test   . Acute febrile illness 05/08/2019  . Acute pancreatitis 05/08/2019  . AKI (acute kidney injury) ([place])  03/26/2020  . Arthritis   . Arthropathy of cervical facet joint 01/31/2019  . Bicuspid aortic valve 03/27/2020   Formatting of this note might be different from the original. 2020: ECHO  . BPH with obstruction/lower urinary tract symptoms 02/20/2016  . CAD -S/P PCI-2016 11/30/2015   LAD DES 2016. Admitted to [facility] Sept 2017 with SOB, not felt to be in CHF, Myoview was negative for sichemia  Formatting of this note might be different from the original. Overview:  LAD  DES 2016. Admitted to [facility] Sept 2017 with SOB, not felt to be in CHF, Myoview was negative for sichemia  Last Assessment & Plan:  History of CAD status post LAD stenting by myself in 2016 with re-intervention because o  . Carotid artery stenosis- 09/26/2014   Rt CEA Oct 2015-Dr Chen  . Carotid stenosis 04/07/2013   Rt CEA Oct 2015-Dr Chen  Last Assessment & Plan:  History of carotid artery disease status post right carotid endarterectomy by Dr. [name] October 2015 which he follows by duplex ultrasound. Formatting of this note might be different from the original. Rt CEA Oct 2015-Dr [name]  Last Assessment & Plan:  History of carotid artery disease status post right carotid endarterectomy by Dr. [name] October 2015  . Carpal tunnel syndrome 07/17/2015  . 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 Cerebrovascular accident (CVA) due to thrombosis of right middle cerebral artery (HCC) 07/04/2014   Questionable small stroke in 2014 with transient Lt sided weakness in setting of carotid disease.  Formatting of this note might be different from the original. Overview:  Questionable small stroke in 2014 with transient Lt sided weakness in setting of carotid disease.  Last Assessment & Plan:  History of multiple strokes in the past dating back to 2014 with a recent stroke in April of this year a  . Cervical spondylosis without myelopathy 01/31/2019  . Cholecystitis 06/24/2019  . Cholelithiases 12/09/2018  . Chronic diarrhea 12/09/2018  . Chronic neck and back pain   . Clostridium difficile diarrhea    negative culture 08/17/19  . Coronary artery disease    drug-eluting stents placed in proximal and mid LAD  . Coronary artery disease with history of myocardial infarction without history of CABG 04/07/2013  . DDD (degenerative disc disease), cervical 01/31/2019  . Degenerative joint disease of thoracic spine 12/09/2018  . Deviated septum   . Diabetes mellitus type 2, uncontrolled (HCC)  04/07/2013  . Diabetic polyneuropathy associated with type 2 diabetes mellitus (HCC) 01/31/2019  . Diabetic retinopathy (HCC)   . Dyspnea on exertion 03/26/2020   Dyspnea on exertion  Formatting of this note might be different from the original. Overview:  Dyspnea on exertion  Last Assessment & Plan:  Improved after stenting of his LAD  . Elevated troponin 12/09/2018  . Erectile dysfunction 11/25/2015  . Essential hypertension 04/07/2013   hypertension  Formatting of this note might be different from the original. hypertension  Last Assessment & Plan:  History of essential hypertension her blood pressure measured at 162/70.  He is on benazepril 10 the morning and 20 in the evening as well as hydralazine, hydrochlorothiazide, and metoprolol.  Blood pressures have been on the high side.  I am going to increase his benazepril to 20 mg   . GERD (gastroesophageal reflux disease)    "occasionally" (03/23/2015)  . Headache    "more than 2/wk" (03/23/2015)  . Heart attack (HCC)    "in 1997 was told  I had signs of a small heart attack that I didn't know I'd had"  . High cholesterol   . History of cardioembolic cerebrovascular accident (CVA) 12/09/2018  . History of gout    "when I was younger"  . History of kidney stones    passed  . History of stroke 04/20/2018  . Hyperlipidemia, unspecified 04/07/2013   hyperlipidemia  Formatting of this note might be different from the original. IMO routine update IMO routine update hyperlipidemia  Last Assessment & Plan:  History of hyperlipidemia on pravastatin with lipid profile performed by his PCP 03/17/2018 revealing total cholesterol 126, LDL 55 HDL 24. Formatting of this note might be different from the original. hyperlipidemia  Last Assessment & Plan:  Hi  . Hypertension   . 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 Kidney disease   . Large liver 12/09/2018  . Lower back pain 06/14/2015  . Lumbar facet joint pain 06/14/2015  . Lumbar foraminal stenosis 01/31/2019  . Lymphocytosis  05/24/2015  . Memory loss 03/15/2020  . Migraine    "had my 1st and only in 12/2014" (03/23/2015)  . Neural foraminal stenosis of cervical spine 01/31/2019  . Neutrophilia 05/24/2015  . Non morbid obesity due to excess calories 02/12/2016  . Occipital neuralgia of right side 12/18/2016  . Occlusion and stenosis of unspecified carotid artery 04/07/2013   Formatting of this note might be different from the original. Overview:  Rt CEA Oct 2015-Dr [name]  Last Assessment & Plan:  History of carotid artery disease status post right carotid endarterectomy by Dr. [name] October 2015 which he follows by duplex ultrasound.  . Peripheral neuropathy    feet  . Peripheral vascular disease (HCC)    carotid artery disease  . Pneumonia 11/19,1/20  . PONV (postoperative nausea and vomiting) 2015   "was told I was confused and combative in recovery from the narcotics w/my carotid OR"  "During a colonoscopy my oxygen level dropped so low that I had to be woke up.- 03/2017 colonoscopy [facility]   . Sepsis (HCC) 05/08/2019  . Serum total bilirubin elevated 05/08/2019  . Sleep apnea    "severe; couldn't afford mask" (03/23/2015) uses O2 at night.no C-pap  . SOB (shortness of breath) 05/04/2019  . Spondylosis of lumbar spine 01/31/2019  . Stroke [facility]) 2014   "on walker for 2 months; left side is weaker and numb since" (03/23/2015), "2 confirmed strokes"  . Stroke (HCC) 05/10/2019   Aphasia mild  . Stroke, lacunar (HCC) 04/07/2013  . Transaminitis 05/08/2019  . Type 2 diabetes mellitus with diabetic neuropathy (HCC) 12/09/2018  . Type 2 diabetes mellitus with diabetic neuropathy, with long-term current use of insulin (HCC) 07/04/2014  . Type 2 diabetes mellitus, with long-term current use of insulin (HCC) 02/12/2016  . Type II diabetes mellitus (HCC)     Past Surgical History:  Procedure Laterality Date  . BILIARY DILATION  08/11/2019   Procedure: BILIARY DILATION;  Surgeon: Meridee Score Netty Starring., MD;  Location: [facility]  ENDOSCOPY;  Service: Gastroenterology;;  . BIOPSY  08/11/2019   Procedure: BIOPSY;  Surgeon: Lemar Lofty., MD;  Location: [facility] ENDOSCOPY;  Service: Gastroenterology;;  . CHOLECYSTECTOMY N/A 08/31/2019   Procedure: LAPAROSCOPIC CHOLECYSTECTOMY;  Surgeon: Gaynelle Adu, MD;  Location: [facility] ORS;  Service: General;  Laterality: N/A;  . CIRCUMCISION  1981  . COLONOSCOPY W/ POLYPECTOMY    . CORONARY ANGIOPLASTY WITH STENT PLACEMENT  03/23/2015   "2"  . CORONARY STENT INTERVENTION N/A 09/29/2017   Procedure: CORONARY STENT  INTERVENTION;  Surgeon: Runell Gess, MD;  Location: [facility] INVASIVE CV LAB;  Service: Cardiovascular;  Laterality: N/A;  . ENDARTERECTOMY Right 09/26/2014   Procedure: RIGHT CAROTID ENDARTERECTOMY WITH PATCH ANGIOPLASTY;  Surgeon: Fransisco Hertz, MD;  Location[facility] OR;  Service: Vascular;  Laterality: Right;  . ENDOSCOPIC RETROGRADE CHOLANGIOPANCREATOGRAPHY (ERCP) WITH PROPOFOL N/A 08/11/2019   Procedure: ENDOSCOPIC RETROGRADE CHOLANGIOPANCREATOGRAPHY (ERCP) WITH PROPOFOL;  Surgeon: Lemar Lofty., MD;  Location: [facility] ENDOSCOPY;  Service: Gastroenterology;  Laterality: N/A;  . ESOPHAGOGASTRODUODENOSCOPY (EGD) WITH PROPOFOL N/A 08/11/2019   Procedure: ESOPHAGOGASTRODUODENOSCOPY (EGD) WITH PROPOFOL;  Surgeon: Meridee Score Netty Starring., MD;  Location: [facility] ENDOSCOPY;  Service: Gastroenterology;  Laterality: N/A;  . EYE SURGERY Right May 2016   Cataract  . EYE SURGERY Left July 2016   Cataract  . HYDROCELE EXCISION  ~ 2008  . IR EXCHANGE BILIARY DRAIN  07/21/2019  . IR PERC CHOLECYSTOSTOMY  05/11/2019  . IR RADIOLOGIST EVAL & MGMT  06/30/2019  . LEFT HEART CATH AND CORONARY ANGIOGRAPHY N/A 09/29/2017   Procedure: LEFT HEART CATH AND CORONARY ANGIOGRAPHY;  Surgeon: Runell Gess, MD;  Location: [facility] INVASIVE CV LAB;  Service: Cardiovascular;  Laterality: N/A;  . LEFT HEART CATHETERIZATION WITH CORONARY ANGIOGRAM N/A 03/23/2015   Procedure: LEFT HEART CATHETERIZATION WITH CORONARY  ANGIOGRAM;  Surgeon: Runell Gess, MD;  Location: [facility] CATH LAB;  Service: Cardiovascular;  Laterality: N/A;  . REFRACTIVE SURGERY Bilateral 08/2014-09/2014   Diabetic retinopathy   . REMOVAL OF STONES  08/11/2019   Procedure: REMOVAL OF STONES;  Surgeon: Meridee Score Netty Starring., MD;  Location: [facility] ENDOSCOPY;  Service: Gastroenterology;;  . Gaspar Bidding DILATION N/A 08/11/2019   Procedure: Gaspar Bidding DILATION;  Surgeon: Lemar Lofty., MD;  Location: [facility] ENDOSCOPY;  Service: Gastroenterology;  Laterality: N/A;  . SPHINCTEROTOMY  08/11/2019   Procedure: SPHINCTEROTOMY;  Surgeon: Meridee Score Netty Starring., MD;  Location: [facility] ENDOSCOPY;  Service: Gastroenterology;;    Current Medications:    Allergies:   Patient has no known allergies.   Social History   Socioeconomic History  . Marital status: Married    Spouse name: Not on file  . Number of children: 5  . Years of education: 12th  . Highest education level: Not on file  Occupational History  . Occupation: Disabled  Tobacco Use  . Smoking status: Never Smoker  . Smokeless tobacco: Never Used  Substance and Sexual Activity  . Alcohol use: No    Alcohol/week: 0.0 standard drinks  . Drug use: No  . Sexual activity: Not Currently    Partners: Female  Other Topics Concern  . Not on file  Social History Narrative   Patient is married with 5 children.    Patient is right handed.   Patient has hs education.   Patient drinks 4 12 oz can sodas daily.   Social Determinants of Health   Financial Resource Strain:   . Difficulty of Paying Living Expenses:   Food Insecurity:   . Worried About Programme researcher, broadcasting/film/video in the Last Year:   . Barista in the Last Year:   Transportation Needs:   . Freight forwarder (Medical):   Marland Kitchen Lack of Transportation (Non-Medical):   Physical Activity:   . Days of Exercise per Week:   . Minutes of Exercise per Session:   Stress:   . Feeling of Stress :   Social Connections:   . Frequency of  Communication with Friends and Family:   . Frequency of Social Gatherings with  Friends and Family:   . Attends Religious Services:   . Active Member of Clubs or Organizations:   . Attends Banker Meetings:   Marland Kitchen Marital Status:      Family History: The patient's family history includes Asthma in his brother; Cancer in his father; Colon cancer in his brother and sister; Diabetes in his brother, daughter, maternal grandfather, and mother; Gout in his brother and maternal uncle; Heart attack in his father; Heart disease in his father and mother; Hyperlipidemia in his brother, mother, and sister; Hypertension in his brother, mother, and sister; Stroke in his paternal grandfather. ROS:   Please see the history of present illness.    All other systems reviewed and are negative.  EKGs/Labs/Other Studies Reviewed:    The following studies were reviewed today:    Recent Labs: 05/08/2019: TSH 1.714 05/09/2019: B Natriuretic Peptide 223.8 05/16/2019: Magnesium 2.1 08/27/2019: ALT 48 10/19/2019: BUN 8; Creatinine, Ser 0.86; Hemoglobin 11.4; Platelets 305; Potassium 4.0; Sodium 140  Recent Lipid Panel    Component Value Date/Time   CHOL 146 08/27/2017 1039   TRIG 119 08/27/2017 1039   HDL 34 (L) 08/27/2017 1039   CHOLHDL 4.3 08/27/2017 1039   CHOLHDL 3.9 03/22/2015 1136   VLDL 44 (H) 03/22/2015 1136   LDLCALC 88 08/27/2017 1039    Physical Exam:    VS:  BP 138/72   Pulse 71   Ht 5\' 6"  (1.676 m)   Wt 255 lb 6.4 oz (115.8 kg)   SpO2 93%   BMI 41.22 kg/m     Wt Readings from Last 3 Encounters:  05/02/20 255 lb 6.4 oz (115.8 kg)  04/07/20 245 lb 8 oz (111.4 kg)  09/02/19 233 lb (105.7 kg)     GEN: Examined in a wheelchair obese well nourished, well developed in no acute distress HEENT: Normal NECK: No JVD; No carotid bruits LYMPHATICS: No lymphadenopathy CARDIAC: Distant heart sounds RRR, no murmurs, rubs, gallops RESPIRATORY:  Clear to auscultation without rales,  wheezing or rhonchi  ABDOMEN: Soft, non-tender, non-distended MUSCULOSKELETAL: Greater than 4+ edema above the umbilicus edema; No deformity  SKIN: Warm and dry NEUROLOGIC:  Alert and oriented x 3 PSYCHIATRIC:  Normal affect    Signed, Norman Herrlich, MD  05/02/2020 8:45 AM    [facility]

## 2020-05-02 ENCOUNTER — Encounter: Payer: Self-pay | Admitting: Cardiology

## 2020-05-02 ENCOUNTER — Other Ambulatory Visit: Payer: Self-pay

## 2020-05-02 ENCOUNTER — Ambulatory Visit (INDEPENDENT_AMBULATORY_CARE_PROVIDER_SITE_OTHER): Payer: Medicare HMO | Admitting: Cardiology

## 2020-05-02 VITALS — BP 138/72 | HR 71 | Ht 66.0 in | Wt 255.4 lb

## 2020-05-02 DIAGNOSIS — R0602 Shortness of breath: Secondary | ICD-10-CM | POA: Diagnosis not present

## 2020-05-02 DIAGNOSIS — I110 Hypertensive heart disease with heart failure: Secondary | ICD-10-CM

## 2020-05-02 DIAGNOSIS — I25118 Atherosclerotic heart disease of native coronary artery with other forms of angina pectoris: Secondary | ICD-10-CM

## 2020-05-02 DIAGNOSIS — I5042 Chronic combined systolic (congestive) and diastolic (congestive) heart failure: Secondary | ICD-10-CM | POA: Diagnosis not present

## 2020-05-02 MED ORDER — TORSEMIDE 20 MG PO TABS: 40.0000 mg | ORAL_TABLET | Freq: Two times a day (BID) | ORAL | 3 refills | Status: DC

## 2020-05-02 NOTE — Patient Instructions (Signed)
Medication Instructions:  Your physician has recommended you make the following change in your medication:  INCREASE: Torsemide 20 mg take two tablets ( 40 mg) total by mouth twice daily.  *If you need a refill on your cardiac medications before your next appointment, please call your pharmacy*   Lab Work: Your physician recommends that you return for lab work NW:GNFAO BMP, ProBNP If you have labs (blood work) drawn today and your tests are completely normal, you will receive your results only by: Marland Kitchen MyChart Message (if you have MyChart) OR . A paper copy in the mail If you have any lab test that is abnormal or we need to change your treatment, we will call you to review the results.   Testing/Procedures: None   Follow-Up: At [facility], you and your health needs are our priority.  As part of our continuing mission to provide you with exceptional heart care, we have created designated Provider Care Teams.  These Care Teams include your primary Cardiologist (physician) and Advanced Practice Providers (APPs -  Physician Assistants and Nurse Practitioners) who all work together to provide you with the care you need, when you need it.  We recommend signing up for the patient portal called "MyChart".  Sign up information is provided on this After Visit Summary.  MyChart is used to connect with patients for Virtual Visits (Telemedicine).  Patients are able to view lab/test results, encounter notes, upcoming appointments, etc.  Non-urgent messages can be sent to your provider as well.   To learn more about what you can do with MyChart, go to [email or web address].    Your next appointment:   2 week(s)  The format for your next appointment:   In Person  Provider:   Norman Herrlich, MD or Dr. Servando Salina   Other Instructions

## 2020-05-03 LAB — BASIC METABOLIC PANEL
BUN/Creatinine Ratio: 19 (ref 10–24)
BUN: 24 mg/dL (ref 8–27)
CO2: 28 mmol/L (ref 20–29)
Calcium: 9.2 mg/dL (ref 8.6–10.2)
Chloride: 96 mmol/L (ref 96–106)
Creatinine, Ser: 1.25 mg/dL (ref 0.76–1.27)
GFR calc Af Amer: 72 mL/min/{1.73_m2} (ref >59)
GFR calc non Af Amer: 62 mL/min/{1.73_m2} (ref >59)
Glucose: 262 mg/dL — ABNORMAL HIGH (ref 65–99)
Potassium: 4.8 mmol/L (ref 3.5–5.2)
Sodium: 141 mmol/L (ref 134–144)

## 2020-05-03 LAB — PRO B NATRIURETIC PEPTIDE: NT-Pro BNP: 124 pg/mL (ref 0–210)

## 2020-05-15 NOTE — Progress Notes (Signed)
Cardiology Office Note:    Date:  05/16/2020   ID:  Johnathan Keith, DOB 1961-04-04, MRN [id number]  PCP:  Johnathan Greenhouse, MD  Cardiologist:  Johnathan More, MD    Referring MD: Johnathan Greenhouse, MD    ASSESSMENT:    1. 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 Hypertensive heart disease with chronic combined systolic and diastolic congestive heart failure (Johnathan Keith)   3. Nonrheumatic aortic valve insufficiency    PLAN:    In order of problems listed above:  1. Improved New York Heart Association class II and remains fluid overloaded.  Continue with higher dose diuretic lifestyle modification home management and recheck renal function follow-up in the office in 4 weeks.  Long-term I would like to decrease his diuretic dosage once he is cleared his peripheral edema. Continue current antihypertensives  should respond to diuresis Recheck echocardiogram after next visit  Next appointment: 1 month   Medication Adjustments/Labs and Tests Ordered: Current medicines are reviewed at length with the patient today.  Concerns regarding medicines are outlined above.  Orders Placed This Encounter  Procedures   Basic Metabolic Panel (BMET)   Meds ordered this encounter  Medications   metoprolol succinate (TOPROL-XL) 50 MG 24 hr tablet    Sig: Take 1 tablet (50 mg total) by mouth daily.    Dispense:  90 tablet    Refill:  3    Chief Complaint  Patient presents with   Follow-up   Congestive Heart Failure    History of Present Illness:    Johnathan Keith is a 59 y.o. male with a hx of hypertension carotid disease with right carotid endarterectomy   Other problems include developing hyperlipidemia and diabetes.  Coronary angiography was performed 09/29/2017 showed an 80% mid LAD stenosis with PCI and previous stent drug-eluting stent 2016 proximal mid LAD patent. It appears that  he has had heart failure in the past as he has been on high-dose  loop diuretic however there is no diagnosis of congestive heart failure in his medical record.  Echocardiogram performed during admission for stroke and sepsis 05/10/2019 showed a reduced ejection fraction 40 to 45% and elevated troponin possibly due to demand ischemia.  He was not seen in consultation by cardiology. He was last seen 09 /09/2018 [facility] after a lacunar stroke admission at [facility].   He was last seen 05/02/2020 with decompensated heart failure. Compliance with diet, lifestyle and medications: Yes  He is improved he has stopped drinking diet colas and adding salt to his food feels better with less edema and is no longer short of breath and having no orthopnea.  Fortunately his daughter is present and participating in his evaluation and decision making.  We will continue his higher dose of diuretic recheck renal function today.  I am pleased with his compliance.  He is having no chest pain palpitation or syncope.  Still short of breath ambulating in the hall but still has peripheral edema.   Ref Range & Units 13 d ago  NT-Pro BNP 0 - 210 pg/mL 124    Past Medical History:  Diagnosis Date   Abdominal distention    Abnormal CT scan 12/09/2018   Abnormal nuclear cardiac imaging test    High-risk nuclear stress test    Acute febrile illness 05/08/2019   Acute pancreatitis 05/08/2019   AKI (acute kidney injury) ([place]) 03/26/2020   Arthritis    Arthropathy of cervical facet joint  01/31/2019   Bicuspid aortic valve 03/27/2020   Formatting of this note might be different from the original. 2020: ECHO   Bilateral low back pain without sciatica 09/13/2015   Bilateral lower extremity edema 08/18/2018   Bilateral lower extremity edema  Formatting of this note might be different from the original. Bilateral lower extremity edema  Last Assessment & Plan:  He does have 1-2+ pitting edema.  I am going to increase his Lasix to 80 mg a day, and will check a basic metabolic panel in 7  to 10 days.  He will see Johnathan Keith back in 1 month for follow-up of lab work.   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 Admitted to [facility] 2017 with SOB, not felt to be in CHF, Myoview was negative for sichemia  Formatting of this note might be different from the original. Overview:  LAD DES 2016. Admitted to [facility] Sept 2017 with SOB, not felt to be in CHF, Myoview was negative for sichemia  Last Assessment & Plan:  History of CAD status post LAD stenting by myself in 2016 with re-intervention because o   Carotid artery stenosis- 09/26/2014   Rt CEA Oct 2015-Dr Keith   Carotid stenosis 04/07/2013   Rt CEA Oct 2015-Dr Keith  Last Assessment & Plan:  History of carotid artery disease status post right carotid endarterectomy by Johnathan Keith October 2015 which he follows by duplex ultrasound. Formatting of this note might be different from the original. Rt CEA Oct 2015-Dr Imogene Keith  Last Assessment & Plan:  History of carotid artery disease status post right carotid endarterectomy by Johnathan Keith October 2015   Carpal tunnel syndrome 07/17/2015   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HCC) 09/13/2015   Cerebrovascular accident (CVA) due to thrombosis of right middle cerebral artery (HCC) 07/04/2014   Questionable small stroke in 2014 with transient Lt sided weakness in setting of carotid disease.  Formatting of this note might be different from the original. Overview:  Questionable small stroke in 2014 with transient Lt sided weakness in setting of carotid disease.  Last Assessment & Plan:  History of multiple strokes in the past dating back to 2014 with a recent stroke in April of this year a   Cervical spine disease 04/07/2013   Cervical spondylosis without myelopathy 01/31/2019   Cholecystitis 06/24/2019   Cholelithiases 12/09/2018   Chronic diarrhea 12/09/2018   Chronic neck and back pain     Chronic pain disorder 01/31/2019   Clostridium difficile diarrhea    negative culture 08/17/19   Coronary artery disease    drug-eluting stents placed in proximal and mid LAD   Coronary artery disease with history of myocardial infarction without history of CABG 04/07/2013   DDD (degenerative disc disease), cervical 01/31/2019   Degenerative joint disease of thoracic spine 12/09/2018   Deviated septum    Diabetes mellitus type 2, uncontrolled (HCC) 04/07/2013   Diabetic polyneuropathy associated with type 2 diabetes mellitus (HCC) 01/31/2019   Diabetic retinopathy (HCC)    Dyslipidemia 12/09/2018   Dyspnea on exertion 03/26/2020   Dyspnea on exertion  Formatting of this note might be different from the original. Overview:  Dyspnea on exertion  Last Assessment & Plan:  Improved after stenting of his LAD   Elevated troponin 12/09/2018   Erectile dysfunction 11/25/2015   Essential hypertension 04/07/2013   hypertension  Formatting of this note might  be different from the original. hypertension  Last Assessment & Plan:  History of essential hypertension her blood pressure measured at 162/70.  He is on benazepril 10 the morning and 20 in the evening as well as hydralazine, hydrochlorothiazide, and metoprolol.  Blood pressures have been on the high side.  I am going to increase his benazepril to 20 mg    GERD (gastroesophageal reflux disease)    "occasionally" (03/23/2015)   Headache    "Keith than 2/wk" (03/23/2015)   Heart attack (HCC)    "in 1997 was told I had signs of a small heart attack that I didn't know I'd had"   High cholesterol    History of cardioembolic cerebrovascular accident (CVA) 12/09/2018   History of gout    "when I was younger"   History of kidney stones    passed   History of stroke 04/20/2018   Hyperlipidemia, unspecified 04/07/2013   hyperlipidemia  Formatting of this note might be different from the original. IMO routine update IMO routine update hyperlipidemia   Last Assessment & Plan:  History of hyperlipidemia on pravastatin with lipid profile performed by his PCP 03/17/2018 revealing total cholesterol 126, LDL 55 HDL 24. Formatting of this note might be different from the original. hyperlipidemia  Last Assessment & Plan:  Hi   Hypertension    Hypogonadism male 01/11/2016   Kidney disease    Large liver 12/09/2018   Lower back pain 06/14/2015   Lumbar facet joint pain 06/14/2015   Lumbar foraminal stenosis 01/31/2019   Lymphocytosis 05/24/2015   Memory loss 03/15/2020   Migraine    "had my 1st and only in 12/2014" (03/23/2015)   Neural foraminal stenosis of cervical spine 01/31/2019   Neutrophilia 05/24/2015   Non morbid obesity due to excess calories 02/12/2016   Occipital neuralgia of right side 12/18/2016   Occlusion and stenosis of unspecified carotid artery 04/07/2013   Formatting of this note might be different from the original. Overview:  Rt CEA Oct 2015-Dr Keith  Last Assessment & Plan:  History of carotid artery disease status post right carotid endarterectomy by Johnathan Keith October 2015 which he follows by duplex ultrasound.   Peripheral neuropathy    feet   Peripheral vascular disease (HCC)    carotid artery disease   Pneumonia 11/19,1/20   PONV (postoperative nausea and vomiting) 2015   "was told I was confused and combative in recovery from the narcotics w/my carotid OR"  "During a colonoscopy my oxygen level dropped so low that I had to be woke up.- 03/2017 colonoscopy [facility]    Sepsis [facility]) 05/08/2019   Serum total bilirubin elevated 05/08/2019   Sleep apnea    "severe; couldn't afford mask" (03/23/2015) uses O2 at night.no C-pap   SOB (shortness of breath) 05/04/2019   Spondylosis of lumbar spine 01/31/2019   Stroke [facility]) 2014   "on walker for 2 months; left side is weaker and numb since" (03/23/2015), "2 confirmed strokes"   Stroke (HCC) 05/10/2019   Aphasia mild   Stroke, lacunar (HCC) 04/07/2013   Transaminitis  05/08/2019   Type 2 diabetes mellitus with diabetic neuropathy (HCC) 12/09/2018   Type 2 diabetes mellitus with diabetic neuropathy, with long-term current use of insulin (HCC) 07/04/2014   Type 2 diabetes mellitus, with long-term current use of insulin (HCC) 02/12/2016   Type II diabetes mellitus (HCC)     Past Surgical History:  Procedure Laterality Date   BILIARY DILATION  08/11/2019   Procedure: BILIARY DILATION;  Surgeon: Lemar Lofty., MD;  Location: [facility] ENDOSCOPY;  Service: Gastroenterology;;   BIOPSY  08/11/2019   Procedure: BIOPSY;  Surgeon: Lemar Lofty., MD;  Location: [facility] ENDOSCOPY;  Service: Gastroenterology;;   CHOLECYSTECTOMY N/A 08/31/2019   Procedure: LAPAROSCOPIC CHOLECYSTECTOMY;  Surgeon: Gaynelle Adu, MD;  Location: [facility] ORS;  Service: General;  Laterality: N/A;   CIRCUMCISION  1981   COLONOSCOPY W/ POLYPECTOMY     CORONARY ANGIOPLASTY WITH STENT PLACEMENT  03/23/2015   "2"   CORONARY STENT INTERVENTION N/A 09/29/2017   Procedure: CORONARY STENT INTERVENTION;  Surgeon: Runell Gess, MD;  Location: [facility] INVASIVE CV LAB;  Service: Cardiovascular;  Laterality: N/A;   ENDARTERECTOMY Right 09/26/2014   Procedure: RIGHT CAROTID ENDARTERECTOMY WITH PATCH ANGIOPLASTY;  Surgeon: Fransisco Hertz, MD;  Location: [facility] OR;  Service: Vascular;  Laterality: Right;   ENDOSCOPIC RETROGRADE CHOLANGIOPANCREATOGRAPHY (ERCP) WITH PROPOFOL N/A 08/11/2019   Procedure: ENDOSCOPIC RETROGRADE CHOLANGIOPANCREATOGRAPHY (ERCP) WITH PROPOFOL;  Surgeon: Lemar Lofty., MD;  Location: [facility] ENDOSCOPY;  Service: Gastroenterology;  Laterality: N/A;   ESOPHAGOGASTRODUODENOSCOPY (EGD) WITH PROPOFOL N/A 08/11/2019   Procedure: ESOPHAGOGASTRODUODENOSCOPY (EGD) WITH PROPOFOL;  Surgeon: Meridee Score Netty Starring., MD;  Location: [facility] ENDOSCOPY;  Service: Gastroenterology;  Laterality: N/A;   EYE SURGERY Right May 2016   Cataract   EYE SURGERY Left July 2016   Cataract   HYDROCELE EXCISION   ~ 2008   IR EXCHANGE BILIARY DRAIN  07/21/2019   IR PERC CHOLECYSTOSTOMY  05/11/2019   IR RADIOLOGIST EVAL & MGMT  06/30/2019   LEFT HEART CATH AND CORONARY ANGIOGRAPHY N/A 09/29/2017   Procedure: LEFT HEART CATH AND CORONARY ANGIOGRAPHY;  Surgeon: Runell Gess, MD;  Location: [facility] INVASIVE CV LAB;  Service: Cardiovascular;  Laterality: N/A;   LEFT HEART CATHETERIZATION WITH CORONARY ANGIOGRAM N/A 03/23/2015   Procedure: LEFT HEART CATHETERIZATION WITH CORONARY ANGIOGRAM;  Surgeon: Runell Gess, MD;  Location: [facility] CATH LAB;  Service: Cardiovascular;  Laterality: N/A;   REFRACTIVE SURGERY Bilateral 08/2014-09/2014   Diabetic retinopathy    REMOVAL OF STONES  08/11/2019   Procedure: REMOVAL OF STONES;  Surgeon: Meridee Score Netty Starring., MD;  Location: [facility] ENDOSCOPY;  Service: Gastroenterology;;   Gaspar Bidding DILATION N/A 08/11/2019   Procedure: Gaspar Bidding DILATION;  Surgeon: Lemar Lofty., MD;  Location: [facility] ENDOSCOPY;  Service: Gastroenterology;  Laterality: N/A;   SPHINCTEROTOMY  08/11/2019   Procedure: SPHINCTEROTOMY;  Surgeon: Mansouraty, Netty Starring., MD;  Location: [facility] ENDOSCOPY;  Service: Gastroenterology;;    Current Medications: Current Meds  Medication Sig   ALPRAZolam (XANAX) 0.25 MG tablet Take 0.25 mg by mouth at bedtime as needed for anxiety.   aspirin 81 MG tablet Take 1 tablet (81 mg total) by mouth daily.   bacitracin ointment bacitracin 500 unit/gram topical packet  APPLY OINTMENT EXTERNALLY TWICE DAILY TO WOUNDS   cholecalciferol (VITAMIN D3) 25 MCG (1000 UT) tablet Take 1,000 Units by mouth daily.   clopidogrel (PLAVIX) 75 MG tablet Take 1 tablet (75 mg total) by mouth daily.   dicyclomine (BENTYL) 10 MG capsule TAKE 1 CAPSULE BY MOUTH 4 TIMES DAILY   diphenoxylate-atropine (LOMOTIL) 2.5-0.025 MG tablet Take 1-2 tablets by mouth daily as needed.   Fluticasone-Umeclidin-Vilant (TRELEGY ELLIPTA) 100-62.5-25 MCG/INH AEPB Inhale 1 puff into the lungs daily as needed.    gabapentin (NEURONTIN) 300 MG capsule Take 300 mg by mouth at bedtime.   insulin NPH Human (NOVOLIN N) 100 UNIT/ML injection Inject 55 Units into the skin 2 (two) times daily.   insulin regular (NOVOLIN  R) 100 units/mL injection Inject 55 Units into the skin 2 (two) times daily. Pt is on a sliding scale   ipratropium-albuterol (DUONEB) 0.5-2.5 (3) MG/3ML SOLN Inhale 3 mLs into the lungs 4 (four) times daily as needed for wheezing or shortness of breath.   loperamide (IMODIUM A-D) 2 MG tablet Imodium A-D 2 mg tablet  use 1 pill after each bowel moement up to 4 daily   metolazone (ZAROXOLYN) 5 MG tablet Take 5 mg by mouth daily.   metoprolol succinate (TOPROL-XL) 50 MG 24 hr tablet Take 1 tablet (50 mg total) by mouth daily.   montelukast (SINGULAIR) 10 MG tablet Take 10 mg by mouth at bedtime.   nitroGLYCERIN (NITROSTAT) 0.4 MG SL tablet Place 1 tablet (0.4 mg total) under the tongue every 5 (five) minutes as needed for chest pain.   oxybutynin (DITROPAN) 5 MG tablet Take 5 mg by mouth at bedtime.    pantoprazole (PROTONIX) 40 MG tablet Take 1 tablet by mouth once daily   Polyvinyl Alcohol-Povidone (REFRESH OP) Place 1 drop into both eyes daily as needed (dry eyes).   pravastatin (PRAVACHOL) 20 MG tablet Take 1 tablet (20 mg total) by mouth at bedtime.   torsemide (DEMADEX) 20 MG tablet Take 2 tablets (40 mg total) by mouth 2 (two) times daily.   [DISCONTINUED] metoprolol succinate (TOPROL-XL) 50 MG 24 hr tablet Take 50 mg by mouth daily.     Allergies:   Patient has no known allergies.   Social History   Socioeconomic History   Marital status: Married    Spouse name: Not on file   Number of children: 5   Years of education: 12th   Highest education level: Not on file  Occupational History   Occupation: Disabled  Tobacco Use   Smoking status: Never Smoker   Smokeless tobacco: Never Used  Substance and Sexual Activity   Alcohol use: No    Alcohol/week: 0.0  standard drinks   Drug use: No   Sexual activity: Not Currently    Partners: Female  Other Topics Concern   Not on file  Social History Narrative   Patient is married with 5 children.    Patient is right handed.   Patient has hs education.   Patient drinks 4 12 oz can sodas daily.   Social Determinants of Health   Financial Resource Strain:    Difficulty of Paying Living Expenses:   Food Insecurity:    Worried About Programme researcher, broadcasting/film/video in the Last Year:    Barista in the Last Year:   Transportation Needs:    Freight forwarder (Medical):    Lack of Transportation (Non-Medical):   Physical Activity:    Days of Exercise per Week:    Minutes of Exercise per Session:   Stress:    Feeling of Stress :   Social Connections:    Frequency of Communication with Friends and Family:    Frequency of Social Gatherings with Friends and Family:    Attends Religious Services:    Active Member of Clubs or Organizations:    Attends Engineer, structural:    Marital Status:      Family History: The patient's family history includes Asthma in his brother; Cancer in his father; Colon cancer in his brother and sister; Diabetes in his brother, daughter, maternal grandfather, and mother; Gout in his brother and maternal uncle; Heart attack in his father; Heart disease in his father and mother; Hyperlipidemia in  his brother, mother, and sister; Hypertension in his brother, mother, and sister; Stroke in his paternal grandfather. ROS:   Please see the history of present illness.    All other systems reviewed and are negative.  EKGs/Labs/Other Studies Reviewed:    The following studies were reviewed today  Recent Labs: 08/27/2019: ALT 48 10/19/2019: Hemoglobin 11.4; Platelets 305 05/02/2020: BUN 24; Creatinine, Ser 1.25; NT-Pro BNP 124; Potassium 4.8; Sodium 141  Recent Lipid Panel    Component Value Date/Time   CHOL 146 08/27/2017 1039   TRIG 119  08/27/2017 1039   HDL 34 (L) 08/27/2017 1039   CHOLHDL 4.3 08/27/2017 1039   CHOLHDL 3.9 03/22/2015 1136   VLDL 44 (H) 03/22/2015 1136   LDLCALC 88 08/27/2017 1039    Physical Exam:    VS:  BP (!) 144/78    Pulse 86    Ht  (1.676 m)    Wt 248 lb 6.4 oz (112.7 kg)    SpO2 95%    BMI 40.09 kg/m     Wt Readings from Last 3 Encounters:  05/16/20 248 lb 6.4 oz (112.7 kg)  05/02/20 255 lb 6.4 oz (115.8 kg)  04/07/20 245 lb 8 oz (111.4 kg)     GEN:  Well nourished, well developed in no acute distress HEENT: Normal NECK: No JVD; No carotid bruits LYMPHATICS: No lymphadenopathy CARDIAC: RRR, no murmurs, rubs, gallops RESPIRATORY:  Clear to auscultation without rales, wheezing or rhonchi  ABDOMEN: Soft, non-tender, non-distended MUSCULOSKELETAL: He has bilateral up to the knee 2+ pitting edema; No deformity also 2+ sacral edema SKIN: Warm and dry NEUROLOGIC:  Alert and oriented x 3 PSYCHIATRIC:  Normal affect    Signed, Norman Herrlich, MD  05/16/2020 10:38 AM    [facility]

## 2020-05-16 ENCOUNTER — Other Ambulatory Visit: Payer: Self-pay

## 2020-05-16 ENCOUNTER — Ambulatory Visit: Payer: Medicare HMO | Admitting: Cardiology

## 2020-05-16 ENCOUNTER — Encounter: Payer: Self-pay | Admitting: Cardiology

## 2020-05-16 VITALS — BP 144/78 | HR 86 | Ht 66.0 in | Wt 248.4 lb

## 2020-05-16 DIAGNOSIS — I5042 Chronic combined systolic (congestive) and diastolic (congestive) heart failure: Secondary | ICD-10-CM

## 2020-05-16 DIAGNOSIS — I351 Nonrheumatic aortic (valve) insufficiency: Secondary | ICD-10-CM | POA: Diagnosis not present

## 2020-05-16 DIAGNOSIS — I110 Hypertensive heart disease with heart failure: Secondary | ICD-10-CM

## 2020-05-16 DIAGNOSIS — I38 Endocarditis, valve unspecified: Secondary | ICD-10-CM

## 2020-05-16 LAB — BASIC METABOLIC PANEL
BUN/Creatinine Ratio: 28 — ABNORMAL HIGH (ref 10–24)
BUN: 37 mg/dL — ABNORMAL HIGH (ref 8–27)
CO2: 26 mmol/L (ref 20–29)
Calcium: 9.7 mg/dL (ref 8.6–10.2)
Chloride: 96 mmol/L (ref 96–106)
Creatinine, Ser: 1.33 mg/dL — ABNORMAL HIGH (ref 0.76–1.27)
GFR calc Af Amer: 67 mL/min/{1.73_m2} (ref >59)
GFR calc non Af Amer: 58 mL/min/{1.73_m2} — ABNORMAL LOW (ref >59)
Glucose: 355 mg/dL — ABNORMAL HIGH (ref 65–99)
Potassium: 5.2 mmol/L (ref 3.5–5.2)
Sodium: 139 mmol/L (ref 134–144)

## 2020-05-16 MED ORDER — METOPROLOL SUCCINATE ER 50 MG PO TB24: 50.0000 mg | ORAL_TABLET | Freq: Every day | ORAL | 3 refills | Status: DC

## 2020-05-16 NOTE — Patient Instructions (Signed)
Medication Instructions:  Your physician recommends that you continue on your current medications as directed. Please refer to the Current Medication list given to you today.  *If you need a refill on your cardiac medications before your next appointment, please call your pharmacy*   Lab Work: Your physician recommends that you return for lab work in: TODAY BMP If you have labs (blood work) drawn today and your tests are completely normal, you will receive your results only by: Marland Kitchen MyChart Message (if you have MyChart) OR . A paper copy in the mail If you have any lab test that is abnormal or we need to change your treatment, we will call you to review the results.   Testing/Procedures: None   Follow-Up: At [facility], you and your health needs are our priority.  As part of our continuing mission to provide you with exceptional heart care, we have created designated Provider Care Teams.  These Care Teams include your primary Cardiologist (physician) and Advanced Practice Providers (APPs -  Physician Assistants and Nurse Practitioners) who all work together to provide you with the care you need, when you need it.  We recommend signing up for the patient portal called "MyChart".  Sign up information is provided on this After Visit Summary.  MyChart is used to connect with patients for Virtual Visits (Telemedicine).  Patients are able to view lab/test results, encounter notes, upcoming appointments, etc.  Non-urgent messages can be sent to your provider as well.   To learn more about what you can do with MyChart, go to [email or web address].    Your next appointment:   4 week(s)  The format for your next appointment:   In Person  Provider:   Norman Herrlich, MD   Other Instructions

## 2020-06-05 ENCOUNTER — Telehealth: Payer: Self-pay | Admitting: Cardiology

## 2020-06-05 DIAGNOSIS — E7849 Other hyperlipidemia: Secondary | ICD-10-CM

## 2020-06-05 MED ORDER — PRAVASTATIN SODIUM 20 MG PO TABS: 20.0000 mg | ORAL_TABLET | Freq: Every day | ORAL | 3 refills | Status: DC

## 2020-06-05 NOTE — Telephone Encounter (Signed)
Refill sent in per request.  

## 2020-06-05 NOTE — Telephone Encounter (Signed)
New message  Per patient's wife need a new prescription for pravastatin (PRAVACHOL) 20 MG tablet sent to [facility] - [place], [place] - [address]

## 2020-06-09 ENCOUNTER — Other Ambulatory Visit: Payer: Self-pay | Admitting: Cardiology

## 2020-06-09 MED ORDER — CLOPIDOGREL BISULFATE 75 MG PO TABS: 75.0000 mg | ORAL_TABLET | Freq: Every day | ORAL | 1 refills | Status: DC

## 2020-06-09 NOTE — Telephone Encounter (Signed)
Refill sent in per request.  

## 2020-06-09 NOTE — Telephone Encounter (Signed)
*  STAT* If patient is at the pharmacy, call can be transferred to refill team.   1. Which medications need to be refilled? (please list name of each medication and dose if known) clopidogrel (PLAVIX) 75 MG tablet  2. Which pharmacy/location (including street and city if local pharmacy) is medication to be sent to? [facility] - [place], [place] - [address]  3. Do they need a 30 day or 90 day supply? 90   Patient is at the pharmacy.

## 2020-06-19 DIAGNOSIS — J9611 Chronic respiratory failure with hypoxia: Secondary | ICD-10-CM

## 2020-06-19 DIAGNOSIS — J189 Pneumonia, unspecified organism: Secondary | ICD-10-CM | POA: Insufficient documentation

## 2020-06-19 HISTORY — DX: Chronic respiratory failure with hypoxia: J96.11

## 2020-06-19 HISTORY — DX: Pneumonia, unspecified organism: J18.9

## 2020-06-19 NOTE — Progress Notes (Signed)
Cardiology Office Note:    Date:  06/20/2020   ID:  Johnathan Keith, DOB [birth year], MRN [id number]  PCP:  Johnathan Bass, MD  Cardiologist:  Johnathan Herrlich, MD    Referring MD: Johnathan Bass, MD    ASSESSMENT:    1. 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 Hypertensive heart disease with chronic combined systolic and diastolic congestive heart failure (HCC)   3. Nonrheumatic aortic valve insufficiency   4. Coronary artery disease of native artery of native heart with stable angina pectoris (HCC)   5. Mixed hyperlipidemia   6. Shortness of breath    PLAN:    In order of problems listed above:  1. Unfortunately he is worsened as a consequence of his hospitalization he has marked fluid overload I am going to go ahead and place him on Zaroxolyn 2 days a week.  I hope he does not require repeat admission to the hospital for IV diuretics he will continue his high-dose torsemide his wife is very meticulous she checks blood pressure at home weighs every day and I will see back in the office in 6 weeks recheck renal function today.  I told him I expect his blood pressure control to be consistent once he is free of fluid overload. 2. CAD hyperlipidemia stable continue current medical treatment   Next appointment: 6 weeks   Medication Adjustments/Labs and Tests Ordered: Current medicines are reviewed at length with the patient today.  Concerns regarding medicines are outlined above.  Orders Placed This Encounter  Procedures  . Basic Metabolic Panel (BMET)  . Pro b natriuretic peptide (BNP)   Meds ordered this encounter  Medications  . metolazone (ZAROXOLYN) 2.5 MG tablet    Sig: Take 1 tablet (2.5 mg total) by mouth 2 (two) times a week.    Dispense:  24 tablet    Refill:  3    Chief Complaint  Patient presents with  . Follow-up    For heart failure    History of Present Illness:    Johnathan Keith is a 59 y.o. male with a hx  of  hypertension carotid disease with right carotid endarterectomy   Other problems include developing hyperlipidemia and diabetes.  Coronary angiography was performed 09/29/2017 showed an 80% mid LAD stenosis with PCI and previous stent drug-eluting stent 2016 proximal mid LAD patent. It appears that  he has had heart failure in the past as he has been on high-dose loop diuretic however there is no diagnosis of congestive heart failure in his medical record.  Echocardiogram performed during admission for stroke and sepsis 05/10/2019 showed a reduced ejection fraction 40 to 45% and elevated troponin possibly due to demand ischemia.  He was not seen in consultation by cardiology. He was last seen 09 /09/2018 Dr. Gery Pray after a lacunar stroke admission at [facility].    He was last seen 05/16/2020 improved after significant diuresis and compliance with sodium restriction.. Compliance with diet, lifestyle and medications: Yes  Since admission to [facility] with pneumonia 06/13/2020 he is worsened although his weight is only up 5 pounds he is markedly fluid overloaded as wife told me he had severe hypertension with systolics greater than 200 in hospital likely associated IV fluid loading he is increasingly short of breath.  No chest pain palpitation or syncope delirium and fever have cleared I have been unaware he is admitted to the hospital is able to access those records and review  them in real-time with the patient and his wife who participated in the evaluation and medical treatment  An echocardiogram performed 05/09/2020 that shows EF of 40 to 45%.  Ref Range & Units 1 mo ago  NT-Pro BNP 0 - 210 pg/mL 124     Past Medical History:  Diagnosis Date  . Abdominal distention   . Abnormal CT scan 12/09/2018  . Abnormal nuclear cardiac imaging test    High-risk nuclear stress test   . Acute febrile illness 05/08/2019  . Acute pancreatitis 05/08/2019  . AKI (acute kidney injury) (HCC) 03/26/2020   . Arthritis   . Arthropathy of cervical facet joint 01/31/2019  . Bicuspid aortic valve 03/27/2020   Formatting of this note might be different from the original. 2020: ECHO  . Bilateral low back pain without sciatica 09/13/2015  . Bilateral lower extremity edema 08/18/2018   Bilateral lower extremity edema  Formatting of this note might be different from the original. Bilateral lower extremity edema  Last Assessment & Plan:  He does have 1-2+ pitting edema.  I am going to increase his Lasix to 80 mg a day, and will check a basic metabolic panel in 7 to 10 days.  He will see Baxter Hire back in 1 month for follow-up of lab work.  Marland Kitchen BPH with obstruction/lower urinary tract symptoms 02/20/2016  . Bradycardia 04/07/2020  . CAD -S/P PCI-2016 11/30/2015   LAD DES 2016. Admitted to [facility] Sept 2017 with SOB, not felt to be in CHF, Myoview was negative for sichemia  Formatting of this note might be different from the original. Overview:  LAD DES 2016. Ad[facility] 2017 with SOB, not felt to be in CHF, Myoview was negative for sichemia  Last Assessment & Plan:  History of CAD status post LAD stenting by myself in 2016 with re-intervention because o  . Carotid artery stenosis- 09/26/2014   Rt CEA Oct 2015-Dr Chen  . Carotid stenosis 04/07/2013   Rt CEA Oct 2015-Dr Chen  Last Assessment & Plan:  History of carotid artery disease status post right carotid endarterectomy by Dr. [name] October 2015 which he follows by duplex ultrasound. Formatting of this note might be different from the original. Rt CEA Oct 2015-Dr [name]  Last Assessment & Plan:  History of carotid artery disease status post right carotid endarterectomy by Dr. [name] October 2015  . Carpal tunnel syndrome 07/17/2015  . 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 Cerebrovascular accident (CVA) due to thrombosis of right middle cerebral artery (HCC) 07/04/2014   Questionable small stroke in 2014 with transient Lt  sided weakness in setting of carotid disease.  Formatting of this note might be different from the original. Overview:  Questionable small stroke in 2014 with transient Lt sided weakness in setting of carotid disease.  Last Assessment & Plan:  History of multiple strokes in the past dating back to 2014 with a recent stroke in April of this year a  . Cervical spine disease 04/07/2013  . Cervical spondylosis without myelopathy 01/31/2019  . Cholecystitis 06/24/2019  . Cholelithiases 12/09/2018  . Chronic diarrhea 12/09/2018  . Chronic neck and back pain   . Chronic pain disorder 01/31/2019  . Clostridium difficile diarrhea    negative culture 08/17/19  . Coronary artery disease    drug-eluting stents placed in proximal and mid LAD  . Coronary artery disease with history of myocardial infarction without history of CABG 04/07/2013  . DDD (degenerative disc  disease), cervical 01/31/2019  . Degenerative joint disease of thoracic spine 12/09/2018  . Deviated septum   . Diabetes mellitus type 2, uncontrolled (HCC) 04/07/2013  . Diabetic polyneuropathy associated with type 2 diabetes mellitus (HCC) 01/31/2019  . Diabetic retinopathy (HCC)   . Dyslipidemia 12/09/2018  . Dyspnea on exertion 03/26/2020   Dyspnea on exertion  Formatting of this note might be different from the original. Overview:  Dyspnea on exertion  Last Assessment & Plan:  Improved after stenting of his LAD  . Elevated troponin 12/09/2018  . Erectile dysfunction 11/25/2015  . Essential hypertension 04/07/2013   hypertension  Formatting of this note might be different from the original. hypertension  Last Assessment & Plan:  History of essential hypertension her blood pressure measured at 162/70.  He is on benazepril 10 the morning and 20 in the evening as well as hydralazine, hydrochlorothiazide, and metoprolol.  Blood pressures have been on the high side.  I am going to increase his benazepril to 20 mg   . GERD (gastroesophageal reflux disease)     "occasionally" (03/23/2015)  . Headache    "more than 2/wk" (03/23/2015)  . Heart attack (HCC)    "in 1997 was told I had signs of a small heart attack that I didn't know I'd had"  . High cholesterol   . History of cardioembolic cerebrovascular accident (CVA) 12/09/2018  . History of gout    "when I was younger"  . History of kidney stones    passed  . History of stroke 04/20/2018  . Hyperlipidemia, unspecified 04/07/2013   hyperlipidemia  Formatting of this note might be different from the original. IMO routine update IMO routine update hyperlipidemia  Last Assessment & Plan:  History of hyperlipidemia on pravastatin with lipid profile performed by his PCP 03/17/2018 revealing total cholesterol 126, LDL 55 HDL 24. Formatting of this note might be different from the original. hyperlipidemia  Last Assessment & Plan:  Hi  . Hypertension   . 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 Kidney disease   . Large liver 12/09/2018  . Lower back pain 06/14/2015  . Lumbar facet joint pain 06/14/2015  . Lumbar foraminal stenosis 01/31/2019  . Lymphocytosis 05/24/2015  . Memory loss 03/15/2020  . Migraine    "had my 1st and only in 12/2014" (03/23/2015)  . Neural foraminal stenosis of cervical spine 01/31/2019  . Neutrophilia 05/24/2015  . Non morbid obesity due to excess calories 02/12/2016  . Occipital neuralgia of right side 12/18/2016  . Occlusion and stenosis of unspecified carotid artery 04/07/2013   Formatting of this note might be different from the original. Overview:  Rt CEA Oct 2015-Dr [name]  Last Assessment & Plan:  History of carotid artery disease status post right carotid endarterectomy by Dr. [name] October 2015 which he follows by duplex ultrasound.  . Peripheral neuropathy    feet  . Peripheral vascular disease (HCC)    carotid artery disease  . Pneumonia 11/19,1/20  . PONV (postoperative nausea and vomiting) 2015   "was told I was confused and combative in recovery from the narcotics w/my carotid OR"  "During a  colonoscopy my oxygen level dropped so low that I had to be woke up.- 03/2017 colonoscopy [facility]   . Sepsis (HCC) 05/08/2019  . Serum total bilirubin elevated 05/08/2019  . Sleep apnea    "severe; couldn't afford mask" (03/23/2015) uses O2 at night.no C-pap  . SOB (shortness of breath) 05/04/2019  . Spondylosis of lumbar spine 01/31/2019  .  Stroke [facility]) 2014   "on walker for 2 months; left side is weaker and numb since" (03/23/2015), "2 confirmed strokes"  . Stroke (HCC) 05/10/2019   Aphasia mild  . Stroke, lacunar (HCC) 04/07/2013  . Transaminitis 05/08/2019  . Type 2 diabetes mellitus with diabetic neuropathy (HCC) 12/09/2018  . Type 2 diabetes mellitus with diabetic neuropathy, with long-term current use of insulin (HCC) 07/04/2014  . Type 2 diabetes mellitus, with long-term current use of insulin (HCC) 02/12/2016  . Type II diabetes mellitus (HCC)     Past Surgical History:  Procedure Laterality Date  . BILIARY DILATION  08/11/2019   Procedure: BILIARY DILATION;  Surgeon: Meridee Score Netty Starring., MD;  Location: [facility] ENDOSCOPY;  Service: Gastroenterology;;  . BIOPSY  08/11/2019   Procedure: BIOPSY;  Surgeon: Lemar Lofty., MD;  Location: [facility] ENDOSCOPY;  Service: Gastroenterology;;  . CHOLECYSTECTOMY N/A 08/31/2019   Procedure: LAPAROSCOPIC CHOLECYSTECTOMY;  Surgeon: Gaynelle Adu, MD;  Location: [facility] ORS;  Service: General;  Laterality: N/A;  . CIRCUMCISION  1981  . COLONOSCOPY W/ POLYPECTOMY    . CORONARY ANGIOPLASTY WITH STENT PLACEMENT  03/23/2015   "2"  . CORONARY STENT INTERVENTION N/A 09/29/2017   Procedure: CORONARY STENT INTERVENTION;  Surgeon: Runell Gess, MD;  Location: [facility] INVASIVE CV LAB;  Service: Cardiovascular;  Laterality: N/A;  . ENDARTERECTOMY Right 09/26/2014   Procedure: RIGHT CAROTID ENDARTERECTOMY WITH PATCH ANGIOPLASTY;  Surgeon: Fransisco Hertz, MD;  Location: [facility] OR;  Service: Vascular;  Laterality: Right;  . ENDOSCOPIC RETROGRADE CHOLANGIOPANCREATOGRAPHY  (ERCP) WITH PROPOFOL N/A 08/11/2019   Procedure: ENDOSCOPIC RETROGRADE CHOLANGIOPANCREATOGRAPHY (ERCP) WITH PROPOFOL;  Surgeon: Lemar Lofty., MD;  Location: [facility] ENDOSCOPY;  Service: Gastroenterology;  Laterality: N/A;  . ESOPHAGOGASTRODUODENOSCOPY (EGD) WITH PROPOFOL N/A 08/11/2019   Procedure: ESOPHAGOGASTRODUODENOSCOPY (EGD) WITH PROPOFOL;  Surgeon: Meridee Score Netty Starring., MD;  Location: [facility] ENDOSCOPY;  Service: Gastroenterology;  Laterality: N/A;  . EYE SURGERY Right May 2016   Cataract  . EYE SURGERY Left July 2016   Cataract  . HYDROCELE EXCISION  ~ 2008  . IR EXCHANGE BILIARY DRAIN  07/21/2019  . IR PERC CHOLECYSTOSTOMY  05/11/2019  . IR RADIOLOGIST EVAL & MGMT  06/30/2019  . LEFT HEART CATH AND CORONARY ANGIOGRAPHY N/A 09/29/2017   Procedure: LEFT HEART CATH AND CORONARY ANGIOGRAPHY;  Surgeon: Runell Gess, MD;  Location: [facility] INVASIVE CV LAB;  Service: Cardiovascular;  Laterality: N/A;  . LEFT HEART CATHETERIZATION WITH CORONARY ANGIOGRAM N/A 03/23/2015   Procedure: LEFT HEART CATHETERIZATION WITH CORONARY ANGIOGRAM;  Surgeon: Runell Gess, MD;  Location: [facility] CATH LAB;  Service: Cardiovascular;  Laterality: N/A;  . REFRACTIVE SURGERY Bilateral 08/2014-09/2014   Diabetic retinopathy   . REMOVAL OF STONES  08/11/2019   Procedure: REMOVAL OF STONES;  Surgeon: Meridee Score Netty Starring., MD;  Location: [facility] ENDOSCOPY;  Service: Gastroenterology;;  . Gaspar Bidding DILATION N/A 08/11/2019   Procedure: Gaspar Bidding DILATION;  Surgeon: Lemar Lofty., MD;  Location: [facility] ENDOSCOPY;  Service: Gastroenterology;  Laterality: N/A;  . SPHINCTEROTOMY  08/11/2019   Procedure: SPHINCTEROTOMY;  Surgeon: Meridee Score Netty Starring., MD;  Location: [facility] ENDOSCOPY;  Service: Gastroenterology;;    Current Medications: Current Meds  Medication Sig  . ALPRAZolam (XANAX) 0.25 MG tablet Take 0.25 mg by mouth at bedtime as needed for anxiety.  Marland Kitchen aspirin 81 MG tablet Take 1 tablet (81 mg total) by mouth daily.  .  bacitracin ointment bacitracin 500 unit/gram topical packet  APPLY OINTMENT EXTERNALLY TWICE DAILY TO WOUNDS  . cholecalciferol (VITAMIN D3) 25  MCG (1000 UT) tablet Take 1,000 Units by mouth daily.  . clopidogrel (PLAVIX) 75 MG tablet Take 1 tablet (75 mg total) by mouth daily.  . diphenoxylate-atropine (LOMOTIL) 2.5-0.025 MG tablet Take 1-2 tablets by mouth daily as needed.  . Fluticasone-Umeclidin-Vilant (TRELEGY ELLIPTA) 100-62.5-25 MCG/INH AEPB Inhale 1 puff into the lungs daily as needed.  . gabapentin (NEURONTIN) 300 MG capsule Take 300 mg by mouth at bedtime.  . insulin NPH Human (NOVOLIN N) 100 UNIT/ML injection Inject 55 Units into the skin 2 (two) times daily.  . insulin regular (NOVOLIN R) 100 units/mL injection Inject 55 Units into the skin 2 (two) times daily. Pt is on a sliding scale  . ipratropium-albuterol (DUONEB) 0.5-2.5 (3) MG/3ML SOLN Inhale 3 mLs into the lungs 4 (four) times daily as needed for wheezing or shortness of breath.  . loperamide (IMODIUM A-D) 2 MG tablet Imodium A-D 2 mg tablet  use 1 pill after each bowel moement up to 4 daily  . metoprolol succinate (TOPROL-XL) 50 MG 24 hr tablet Take 1 tablet (50 mg total) by mouth daily.  . montelukast (SINGULAIR) 10 MG tablet Take 10 mg by mouth at bedtime.  . nitroGLYCERIN (NITROSTAT) 0.4 MG SL tablet Place 1 tablet (0.4 mg total) under the tongue every 5 (five) minutes as needed for chest pain.  Marland Kitchen oxybutynin (DITROPAN) 5 MG tablet Take 5 mg by mouth at bedtime.   . pantoprazole (PROTONIX) 40 MG tablet Take 1 tablet by mouth once daily  . Polyvinyl Alcohol-Povidone (REFRESH OP) Place 1 drop into both eyes daily as needed (dry eyes).  . pravastatin (PRAVACHOL) 20 MG tablet Take 1 tablet (20 mg total) by mouth at bedtime.  . torsemide (DEMADEX) 20 MG tablet Take 2 tablets (40 mg total) by mouth 2 (two) times daily.  . [DISCONTINUED] dicyclomine (BENTYL) 10 MG capsule TAKE 1 CAPSULE BY MOUTH 4 TIMES DAILY     Allergies:    Patient has no known allergies.   Social History   Socioeconomic History  . Marital status: Married    Spouse name: Not on file  . Number of children: 5  . Years of education: 12th  . Highest education level: Not on file  Occupational History  . Occupation: Disabled  Tobacco Use  . Smoking status: Never Smoker  . Smokeless tobacco: Never Used  Vaping Use  . Vaping Use: Never used  Substance and Sexual Activity  . Alcohol use: No    Alcohol/week: 0.0 standard drinks  . Drug use: No  . Sexual activity: Not Currently    Partners: Female  Other Topics Concern  . Not on file  Social History Narrative   Patient is married with 5 children.    Patient is right handed.   Patient has hs education.   Patient drinks 4 12 oz can sodas daily.   Social Determinants of Health   Financial Resource Strain:   . Difficulty of Paying Living Expenses:   Food Insecurity:   . Worried About Programme researcher, broadcasting/film/video in the Last Year:   . Barista in the Last Year:   Transportation Needs:   . Freight forwarder (Medical):   Marland Kitchen Lack of Transportation (Non-Medical):   Physical Activity:   . Days of Exercise per Week:   . Minutes of Exercise per Session:   Stress:   . Feeling of Stress :   Social Connections:   . Frequency of Communication with Friends and Family:   . Frequency  of Social Gatherings with Friends and Family:   . Attends Religious Services:   . Active Member of Clubs or Organizations:   . Attends Banker Meetings:   Marland Kitchen Marital Status:      Family History: The patient's family history includes Asthma in his brother; Cancer in his father; Colon cancer in his brother and sister; Diabetes in his brother, daughter, maternal grandfather, and mother; Gout in his brother and maternal uncle; Heart attack in his father; Heart disease in his father and mother; Hyperlipidemia in his brother, mother, and sister; Hypertension in his brother, mother, and sister; Stroke in  his paternal grandfather. ROS:   Please see the history of present illness.    All other systems reviewed and are negative.  EKGs/Labs/Other Studies Reviewed:    The following studies were reviewed today:  EKG:  E[facility] reviewed [facility]13[facility]2020 showed sinus rhythm left atrial enlargement otherwise normal.  Laboratory test from that hospitalization showed an elevated white count 11,500 hemoglobin 13.3 potassium 4.1 creatinine 1.10 he did not have a proBNP level done and had 2 troponins that were normal serology for influenza COVID-19 were normal.  He presented with fever and was treated as pneumonia although he did not have an infiltrate on chest x-ray.  Recent Labs: 08/27/2019: ALT 48 10/19/2019: Hemoglobin 11.4; Platelets 305 05/02/2020: NT-Pro BNP 124 05/16/2020: BUN 37; Creatinine, Ser 1.33; Potassium 5.2; Sodium 139  Recent Lipid Panel    Component Value Date/Time   CHOL 146 08/27/2017 1039   TRIG 119 08/27/2017 1039   HDL 34 (L) 08/27/2017 1039   CHOLHDL 4.3 08/27/2017 1039   CHOLHDL 3.9 03/22/2015 1136   VLDL 44 (H) 03/22/2015 1136   LDLCALC 88 08/27/2017 1039    Physical Exam:    VS:  BP (!) 142/88 (BP Location: Left Arm, Patient Position: Sitting, Cuff Size: Normal)   Pulse 86   Ht 5\' 6"  (1.676 m)   Wt 253 lb (114.8 kg)   SpO2 (!) 88%   BMI 40.84 kg/m     Wt Readings from Last 3 Encounters:  06/20/20 253 lb (114.8 kg)  05/16/20 248 lb 6.4 oz (112.7 kg)  05/02/20 255 lb 6.4 oz (115.8 kg)     GEN:  Well nourished, well developed in no acute distress HEENT: Normal NECK: No JVD; No carotid bruits LYMPHATICS: No lymphadenopathy CARDIAC: RRR, no murmurs, rubs, gallops RESPIRATORY: Decreased breath sounds at the bases mild expiratory wheezing ABDOMEN: Soft, non-tender, non-distended MUSCULOSKELETAL: He has a marked increase in edema 4+ into his thighs and sacral edema; No deformity  SKIN: Warm and dry NEUROLOGIC:  Alert and oriented x 3 PSYCHIATRIC:   Normal affect    Signed, 05/04/20, MD  06/20/2020 9:16 AM    [facility]

## 2020-06-20 ENCOUNTER — Ambulatory Visit: Payer: Medicare HMO | Admitting: Cardiology

## 2020-06-20 ENCOUNTER — Encounter: Payer: Self-pay | Admitting: Cardiology

## 2020-06-20 ENCOUNTER — Other Ambulatory Visit: Payer: Self-pay

## 2020-06-20 VITALS — BP 142/88 | HR 86 | Ht 66.0 in | Wt 253.0 lb

## 2020-06-20 DIAGNOSIS — I351 Nonrheumatic aortic (valve) insufficiency: Secondary | ICD-10-CM | POA: Diagnosis not present

## 2020-06-20 DIAGNOSIS — I110 Hypertensive heart disease with heart failure: Secondary | ICD-10-CM | POA: Diagnosis not present

## 2020-06-20 DIAGNOSIS — E782 Mixed hyperlipidemia: Secondary | ICD-10-CM

## 2020-06-20 DIAGNOSIS — I5042 Chronic combined systolic (congestive) and diastolic (congestive) heart failure: Secondary | ICD-10-CM

## 2020-06-20 DIAGNOSIS — I25118 Atherosclerotic heart disease of native coronary artery with other forms of angina pectoris: Secondary | ICD-10-CM

## 2020-06-20 DIAGNOSIS — I38 Endocarditis, valve unspecified: Secondary | ICD-10-CM

## 2020-06-20 DIAGNOSIS — R0602 Shortness of breath: Secondary | ICD-10-CM

## 2020-06-20 LAB — BASIC METABOLIC PANEL
BUN/Creatinine Ratio: 19 (ref 10–24)
BUN: 19 mg/dL (ref 8–27)
CO2: 25 mmol/L (ref 20–29)
Calcium: 8.7 mg/dL (ref 8.6–10.2)
Chloride: 100 mmol/L (ref 96–106)
Creatinine, Ser: 0.98 mg/dL (ref 0.76–1.27)
GFR calc Af Amer: 96 mL/min/{1.73_m2} (ref >59)
GFR calc non Af Amer: 83 mL/min/{1.73_m2} (ref >59)
Glucose: 236 mg/dL — ABNORMAL HIGH (ref 65–99)
Potassium: 3.7 mmol/L (ref 3.5–5.2)
Sodium: 142 mmol/L (ref 134–144)

## 2020-06-20 LAB — PRO B NATRIURETIC PEPTIDE: NT-Pro BNP: 268 pg/mL — ABNORMAL HIGH (ref 0–210)

## 2020-06-20 MED ORDER — METOLAZONE 2.5 MG PO TABS
2.5000 mg | ORAL_TABLET | ORAL | 3 refills | Status: DC
Start: 2020-06-22 — End: 2021-05-08

## 2020-06-20 NOTE — Patient Instructions (Signed)
Medication Instructions:  Your physician has recommended you make the following change in your medication:  START: Metolazone 2.5 mg take one tablet by mouth twice weekly on Wednesday and Saturday. Please take this 45 minutes prior to your dose of torsemide.  *If you need a refill on your cardiac medications before your next appointment, please call your pharmacy*   Lab Work: Your physician recommends that you return for lab work in: TODAY BMP, ProBNP If you have labs (blood work) drawn today and your tests are completely normal, you will receive your results only by: Marland Kitchen MyChart Message (if you have MyChart) OR . A paper copy in the mail If you have any lab test that is abnormal or we need to change your treatment, we will call you to review the results.   Testing/Procedures: None   Follow-Up: At [facility], you and your health needs are our priority.  As part of our continuing mission to provide you with exceptional heart care, we have created designated Provider Care Teams.  These Care Teams include your primary Cardiologist (physician) and Advanced Practice Providers (APPs -  Physician Assistants and Nurse Practitioners) who all work together to provide you with the care you need, when you need it.  We recommend signing up for the patient portal called "MyChart".  Sign up information is provided on this After Visit Summary.  MyChart is used to connect with patients for Virtual Visits (Telemedicine).  Patients are able to view lab/test results, encounter notes, upcoming appointments, etc.  Non-urgent messages can be sent to your provider as well.   To learn more about what you can do with MyChart, go to [email or web address].    Your next appointment:   6 week(s)  The format for your next appointment:   In Person  Provider:   Norman Herrlich, MD   Other Instructions

## 2020-07-24 ENCOUNTER — Other Ambulatory Visit: Payer: Self-pay | Admitting: Gastroenterology

## 2020-07-27 DIAGNOSIS — F4323 Adjustment disorder with mixed anxiety and depressed mood: Secondary | ICD-10-CM

## 2020-07-27 HISTORY — DX: Adjustment disorder with mixed anxiety and depressed mood: F43.23

## 2020-08-02 DIAGNOSIS — G629 Polyneuropathy, unspecified: Secondary | ICD-10-CM | POA: Insufficient documentation

## 2020-08-02 DIAGNOSIS — Z8739 Personal history of other diseases of the musculoskeletal system and connective tissue: Secondary | ICD-10-CM | POA: Insufficient documentation

## 2020-08-02 DIAGNOSIS — M1990 Unspecified osteoarthritis, unspecified site: Secondary | ICD-10-CM | POA: Insufficient documentation

## 2020-08-02 DIAGNOSIS — E119 Type 2 diabetes mellitus without complications: Secondary | ICD-10-CM | POA: Insufficient documentation

## 2020-08-02 DIAGNOSIS — I739 Peripheral vascular disease, unspecified: Secondary | ICD-10-CM | POA: Insufficient documentation

## 2020-08-02 DIAGNOSIS — J342 Deviated nasal septum: Secondary | ICD-10-CM | POA: Insufficient documentation

## 2020-08-02 DIAGNOSIS — R519 Headache, unspecified: Secondary | ICD-10-CM | POA: Insufficient documentation

## 2020-08-02 DIAGNOSIS — I219 Acute myocardial infarction, unspecified: Secondary | ICD-10-CM | POA: Insufficient documentation

## 2020-08-02 DIAGNOSIS — Z87442 Personal history of urinary calculi: Secondary | ICD-10-CM | POA: Insufficient documentation

## 2020-08-02 DIAGNOSIS — J189 Pneumonia, unspecified organism: Secondary | ICD-10-CM | POA: Insufficient documentation

## 2020-08-02 DIAGNOSIS — E7800 Pure hypercholesterolemia, unspecified: Secondary | ICD-10-CM | POA: Insufficient documentation

## 2020-08-02 DIAGNOSIS — G43909 Migraine, unspecified, not intractable, without status migrainosus: Secondary | ICD-10-CM | POA: Insufficient documentation

## 2020-08-02 DIAGNOSIS — E11319 Type 2 diabetes mellitus with unspecified diabetic retinopathy without macular edema: Secondary | ICD-10-CM | POA: Insufficient documentation

## 2020-08-03 NOTE — Progress Notes (Signed)
Cardiology Office Note:    Date:  08/04/2020   ID:  Johnathan Keith, DOB 02-09-1961, MRN [id number]  PCP:  Olive Bass, MD  Cardiologist:  Norman Herrlich, MD    Referring MD: Olive Bass, MD    ASSESSMENT:    1. 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 Coronary artery disease with history of myocardial infarction without history of CABG   3. Hypertensive heart disease with chronic combined systolic and diastolic congestive heart failure (HCC)    PLAN:    In order of problems listed above:  1. Is improved heart failure is compensated he has no fluid overload and with the assistance of his family weighs daily restrict sodium is supervised with medications.  At this time and continue his current low dose of loop diuretic strongly encouraged him to continue his current endeavors.  We will recheck renal function proBNP today. 2. BP at target continue his current antihypertensive agents including his diuretics beta-blocker 3. Stable CAD New York Heart Association class I continue therapy including clopidogrel beta-blocker and statin. 4. Strongly encouraged him to get COVID-19 vaccine.   Next appointment: 3 months   Medication Adjustments/Labs and Tests Ordered: Current medicines are reviewed at length with the patient today.  Concerns regarding medicines are outlined above.  Orders Placed This Encounter  Procedures  . Basic metabolic panel  . Pro b natriuretic peptide (BNP)   No orders of the defined types were placed in this encounter.   Chief Complaint  Patient presents with  . Follow-up  . Congestive Heart Failure    History of Present Illness:    Johnathan Keith is a 59 y.o. male with a hx of  hypertension carotid disease with right carotid endarterectomy   Other problems include developing hyperlipidemia and diabetes.  Coronary angiography was performed 09/29/2017 showed an 80% mid LAD stenosis with PCI and previous  stent drug-eluting stent 2016 proximal mid LAD patent. It appears that  he has had heart failure in the past as he has been on high-dose loop diuretic however there is no diagnosis of congestive heart failure in his medical record.  Echocardiogram performed during admission for stroke and sepsis 05/10/2019 showed a reduced ejection fraction 40 to 45%  last seen 06/20/2020. Compliance with diet, lifestyle and medications: Yes  I am somewhat surprised he engages me in a discussion about his risk of COVID-19 he has not had vaccine he will not accept it.  He is still going to church.  I tried to explain the thought process that he is at high risk but he will not change his mind.  I recommended to him that he stops going to church in a closed building at this point in time  From my perspective he is doing well his weight is stable to 39 to 243 pounds he is improved he is out of a wheelchair he can ambulate and is limited by back pain not shortness of breath intermittent edema none today no orthopnea PND or chest pain. Past Medical History:  Diagnosis Date  . Abdominal distention   . Abnormal CT scan 12/09/2018  . Abnormal nuclear cardiac imaging test    High-risk nuclear stress test   . Acute febrile illness 05/08/2019  . Acute pancreatitis 05/08/2019  . AKI (acute kidney injury) (HCC) 03/26/2020  . Arthritis   . Arthropathy of cervical facet joint 01/31/2019  . Bicuspid aortic valve 03/27/2020   Formatting of this note  might be different from the original. 2020: ECHO  . Bilateral low back pain without sciatica 09/13/2015  . Bilateral lower extremity edema 08/18/2018   Bilateral lower extremity edema  Formatting of this note might be different from the original. Bilateral lower extremity edema  Last Assessment & Plan:  He does have 1-2+ pitting edema.  I am going to increase his Lasix to 80 mg a day, and will check a basic metabolic panel in 7 to 10 days.  He will see Baxter Hire back in 1 month for follow-up of  lab work.  Marland Kitchen BPH with obstruction/lower urinary tract symptoms 02/20/2016  . Bradycardia 04/07/2020  . CAD -S/P PCI-2016 11/30/2015   LAD DES 2016. Admitted to [facility] Sept 2017 with SOB, not felt to be in CHF, Myoview was negative for sichemia  Formatting of this note might be different from the original. Overview:  LAD DES 2016. Admitted to [facility] Sept 2017 with SOB, not felt to be in CHF, Myoview was negative for sichemia  Last Assessment & Plan:  History of CAD status post LAD stenting by myself in 2016 with re-intervention because o  . Carotid artery stenosis- 09/26/2014   Rt CEA Oct 2015-Dr Chen  . Carotid stenosis 04/07/2013   Rt CEA Oct 2015-Dr Chen  Last Assessment & Plan:  History of carotid artery disease status post right carotid endarterectomy by Dr. [name] October 2015 which he follows by duplex ultrasound. Formatting of this note might be different from the original. Rt CEA Oct 2015-Dr [name]  Last Assessment & Plan:  History of carotid artery disease status post right carotid endarterectomy by Dr. [name] October 2015  . Carpal tunnel syndrome 07/17/2015  . 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 Cerebrovascular accident (CVA) due to thrombosis of right middle cerebral artery (HCC) 07/04/2014   Questionable small stroke in 2014 with transient Lt sided weakness in setting of carotid disease.  Formatting of this note might be different from the original. Overview:  Questionable small stroke in 2014 with transient Lt sided weakness in setting of carotid disease.  Last Assessment & Plan:  History of multiple strokes in the past dating back to 2014 with a recent stroke in April of this year a  . Cervical spine disease 04/07/2013  . Cervical spondylosis without myelopathy 01/31/2019  . Cholecystitis 06/24/2019  . Cholelithiases 12/09/2018  . Chronic diarrhea 12/09/2018  . Chronic neck and back pain   . Chronic pain disorder 01/31/2019  . Clostridium difficile diarrhea     negative culture 08/17/19  . Coronary artery disease    drug-eluting stents placed in proximal and mid LAD  . Coronary artery disease with history of myocardial infarction without history of CABG 04/07/2013  . DDD (degenerative disc disease), cervical 01/31/2019  . Degenerative joint disease of thoracic spine 12/09/2018  . Deviated septum   . Diabetes mellitus type 2, uncontrolled (HCC) 04/07/2013  . Diabetic polyneuropathy associated with type 2 diabetes mellitus (HCC) 01/31/2019  . Diabetic retinopathy (HCC)   . Dyslipidemia 12/09/2018  . Dyspnea on exertion 03/26/2020   Dyspnea on exertion  Formatting of this note might be different from the original. Overview:  Dyspnea on exertion  Last Assessment & Plan:  Improved after stenting of his LAD  . Elevated troponin 12/09/2018  . Erectile dysfunction 11/25/2015  . Essential hypertension 04/07/2013   hypertension  Formatting of this note might be different from the original. hypertension  Last Assessment & Plan:  History  of essential hypertension her blood pressure measured at 162/70.  He is on benazepril 10 the morning and 20 in the evening as well as hydralazine, hydrochlorothiazide, and metoprolol.  Blood pressures have been on the high side.  I am going to increase his benazepril to 20 mg   . GERD (gastroesophageal reflux disease)    "occasionally" (03/23/2015)  . Headache    "more than 2/wk" (03/23/2015)  . Heart attack (HCC)    "in 1997 was told I had signs of a small heart attack that I didn't know I'd had"  . High cholesterol   . History of cardioembolic cerebrovascular accident (CVA) 12/09/2018  . History of gout    "when I was younger"  . History of kidney stones    passed  . History of stroke 04/20/2018  . Hyperlipidemia, unspecified 04/07/2013   hyperlipidemia  Formatting of this note might be different from the original. IMO routine update IMO routine update hyperlipidemia  Last Assessment & Plan:  History of hyperlipidemia on pravastatin  with lipid profile performed by his PCP 03/17/2018 revealing total cholesterol 126, LDL 55 HDL 24. Formatting of this note might be different from the original. hyperlipidemia  Last Assessment & Plan:  Hi  . Hypertension   . 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 Kidney disease   . Large liver 12/09/2018  . Lower back pain 06/14/2015  . Lumbar facet joint pain 06/14/2015  . Lumbar foraminal stenosis 01/31/2019  . Lymphocytosis 05/24/2015  . Memory loss 03/15/2020  . Migraine    "had my 1st and only in 12/2014" (03/23/2015)  . Neural foraminal stenosis of cervical spine 01/31/2019  . Neutrophilia 05/24/2015  . Non morbid obesity due to excess calories 02/12/2016  . Occipital neuralgia of right side 12/18/2016  . Occlusion and stenosis of unspecified carotid artery 04/07/2013   Formatting of this note might be different from the original. Overview:  Rt CEA Oct 2015-Dr [name]  Last Assessment & Plan:  History of carotid artery disease status post right carotid endarterectomy by Dr. [name] October 2015 which he follows by duplex ultrasound.  . Peripheral neuropathy    feet  . Peripheral vascular disease (HCC)    carotid artery disease  . Pneumonia 11/19,1/20  . PONV (postoperative nausea and vomiting) 2015   "was told I was confused and combative in recovery from the narcotics w/my carotid OR"  "During a colonoscopy my oxygen level dropped so low that I had to be woke up.- 03/2017 colonoscopy [facility]   . Sepsis (HCC) 05/08/2019  . Serum total bilirubin elevated 05/08/2019  . Sleep apnea    "severe; couldn't afford mask" (03/23/2015) uses O2 at night.no C-pap  . SOB (shortness of breath) 05/04/2019  . Spondylosis of lumbar spine 01/31/2019  . Stroke [facility]) 2014   "on walker for 2 months; left side is weaker and numb since" (03/23/2015), "2 confirmed strokes"  . Stroke (HCC) 05/10/2019   Aphasia mild  . Stroke, lacunar (HCC) 04/07/2013  . Transaminitis 05/08/2019  . Type 2 diabetes mellitus with diabetic neuropathy  (HCC) 12/09/2018  . Type 2 diabetes mellitus with diabetic neuropathy, with long-term current use of insulin (HCC) 07/04/2014  . Type 2 diabetes mellitus, with long-term current use of insulin (HCC) 02/12/2016  . Type II diabetes mellitus (HCC)     Past Surgical History:  Procedure Laterality Date  . BILIARY DILATION  08/11/2019   Procedure: BILIARY DILATION;  Surgeon: Meridee Score Netty Starring., MD;  Location: [facility] ENDOSCOPY;  Service: Gastroenterology;;  .  BIOPSY  08/11/2019   Procedure: BIOPSY;  Surgeon: Meridee Score Netty Starring., MD;  Location: [facility] ENDOSCOPY;  Service: Gastroenterology;;  . CHOLECYSTECTOMY N/A 08/31/2019   Procedure: LAPAROSCOPIC CHOLECYSTECTOMY;  Surgeon: Gaynelle Adu, MD;  Location: [facility] ORS;  Service: General;  Laterality: N/A;  . CIRCUMCISION  1981  . COLONOSCOPY W/ POLYPECTOMY    . CORONARY ANGIOPLASTY WITH STENT PLACEMENT  03/23/2015   "2"  . CORONARY STENT INTERVENTION N/A 09/29/2017   Procedure: CORONARY STENT INTERVENTION;  Surgeon: Runell Gess, MD;  Location: [facility] INVASIVE CV LAB;  Service: Cardiovascular;  Laterality: N/A;  . ENDARTERECTOMY Right 09/26/2014   Procedure: RIGHT CAROTID ENDARTERECTOMY WITH PATCH ANGIOPLASTY;  Surgeon: Fransisco Hertz, MD;  Location: [facility] OR;  Service: Vascular;  Laterality: Right;  . ENDOSCOPIC RETROGRADE CHOLANGIOPANCREATOGRAPHY (ERCP) WITH PROPOFOL N/A 08/11/2019   Procedure: ENDOSCOPIC RETROGRADE CHOLANGIOPANCREATOGRAPHY (ERCP) WITH PROPOFOL;  Surgeon: Lemar Lofty., MD;  Location: [facility] ENDOSCOPY;  Service: Gastroenterology;  Laterality: N/A;  . ESOPHAGOGASTRODUODENOSCOPY (EGD) WITH PROPOFOL N/A 08/11/2019   Procedure: ESOPHAGOGASTRODUODENOSCOPY (EGD) WITH PROPOFOL;  Surgeon: Meridee Score Netty Starring., MD;  Location: [facility] ENDOSCOPY;  Service: Gastroenterology;  Laterality: N/A;  . EYE SURGERY Right May 2016   Cataract  . EYE SURGERY Left July 2016   Cataract  . HYDROCELE EXCISION  ~ 2008  . IR EXCHANGE BILIARY DRAIN  07/21/2019  . IR PERC  CHOLECYSTOSTOMY  05/11/2019  . IR RADIOLOGIST EVAL & MGMT  06/30/2019  . LEFT HEART CATH AND CORONARY ANGIOGRAPHY N/A 09/29/2017   Procedure: LEFT HEART CATH AND CORONARY ANGIOGRAPHY;  Surgeon: Runell Gess, MD;  Location: [facility] INVASIVE CV LAB;  Service: Cardiovascular;  Laterality: N/A;  . LEFT HEART CATHETERIZATION WITH CORONARY ANGIOGRAM N/A 03/23/2015   Procedure: LEFT HEART CATHETERIZATION WITH CORONARY ANGIOGRAM;  Surgeon: Runell Gess, MD;  Location: [facility] CATH LAB;  Service: Cardiovascular;  Laterality: N/A;  . REFRACTIVE SURGERY Bilateral 08/2014-09/2014   Diabetic retinopathy   . REMOVAL OF STONES  08/11/2019   Procedure: REMOVAL OF STONES;  Surgeon: Meridee Score Netty Starring., MD;  Location: [facility] ENDOSCOPY;  Service: Gastroenterology;;  . Gaspar Bidding DILATION N/A 08/11/2019   Procedure: Gaspar Bidding DILATION;  Surgeon: Lemar Lofty., MD;  Location: [facility] ENDOSCOPY;  Service: Gastroenterology;  Laterality: N/A;  . SPHINCTEROTOMY  08/11/2019   Procedure: SPHINCTEROTOMY;  Surgeon: Meridee Score Netty Starring., MD;  Location: [facility] ENDOSCOPY;  Service: Gastroenterology;;    Current Medications: Current Meds  Medication Sig  . ALPRAZolam (XANAX) 0.25 MG tablet Take 0.25 mg by mouth at bedtime as needed for anxiety.  Marland Kitchen aspirin 81 MG tablet Take 1 tablet (81 mg total) by mouth daily.  . bacitracin ointment bacitracin 500 unit/gram topical packet  APPLY OINTMENT EXTERNALLY TWICE DAILY TO WOUNDS  . cholecalciferol (VITAMIN D3) 25 MCG (1000 UT) tablet Take 1,000 Units by mouth daily.  . clopidogrel (PLAVIX) 75 MG tablet Take 1 tablet (75 mg total) by mouth daily.  . diphenoxylate-atropine (LOMOTIL) 2.5-0.025 MG tablet Take 1-2 tablets by mouth daily as needed.  . Fluticasone-Umeclidin-Vilant (TRELEGY ELLIPTA) 100-62.5-25 MCG/INH AEPB Inhale 1 puff into the lungs daily as needed.  . gabapentin (NEURONTIN) 300 MG capsule Take 300 mg by mouth at bedtime.  . insulin detemir (LEVEMIR) 100 UNIT/ML FlexPen Inject into  the skin.  Marland Kitchen insulin lispro (HUMALOG) 100 UNIT/ML KwikPen Inject into the skin.  Marland Kitchen insulin NPH Human (NOVOLIN N) 100 UNIT/ML injection Inject 55 Units into the skin 2 (two) times daily.  . insulin regular (NOVOLIN R) 100 units/mL injection Inject  55 Units into the skin 2 (two) times daily. Pt is on a sliding scale  . ipratropium-albuterol (DUONEB) 0.5-2.5 (3) MG/3ML SOLN Inhale 3 mLs into the lungs 4 (four) times daily as needed for wheezing or shortness of breath.  . loperamide (IMODIUM A-D) 2 MG tablet Imodium A-D 2 mg tablet  use 1 pill after each bowel moement up to 4 daily  . metFORMIN (GLUCOPHAGE-XR) 750 MG 24 hr tablet Take 1 tablet by mouth in the morning and at bedtime.  . metolazone (ZAROXOLYN) 2.5 MG tablet Take 1 tablet (2.5 mg total) by mouth 2 (two) times a week.  . metoprolol succinate (TOPROL-XL) 50 MG 24 hr tablet Take 1 tablet (50 mg total) by mouth daily.  . montelukast (SINGULAIR) 10 MG tablet Take 10 mg by mouth at bedtime.  . nitroGLYCERIN (NITROSTAT) 0.4 MG SL tablet Place 1 tablet (0.4 mg total) under the tongue every 5 (five) minutes as needed for chest pain.  Marland Kitchen oxybutynin (DITROPAN) 5 MG tablet Take 5 mg by mouth at bedtime.   . pantoprazole (PROTONIX) 40 MG tablet Take 1 tablet by mouth once daily  . Polyvinyl Alcohol-Povidone (REFRESH OP) Place 1 drop into both eyes daily as needed (dry eyes).  . pravastatin (PRAVACHOL) 20 MG tablet Take 1 tablet (20 mg total) by mouth at bedtime.  . tamsulosin (FLOMAX) 0.4 MG CAPS capsule TAKE ONE CAPSULE BY MOUTH TWICE DAILY 30 MINUTES AFTER MEAL  . torsemide (DEMADEX) 20 MG tablet Take 2 tablets (40 mg total) by mouth 2 (two) times daily.     Allergies:   Patient has no known allergies.   Social History   Socioeconomic History  . Marital status: Married    Spouse name: Not on file  . Number of children: 5  . Years of education: 12th  . Highest education level: Not on file  Occupational History  . Occupation: Disabled   Tobacco Use  . Smoking status: Never Smoker  . Smokeless tobacco: Never Used  Vaping Use  . Vaping Use: Never used  Substance and Sexual Activity  . Alcohol use: No    Alcohol/week: 0.0 standard drinks  . Drug use: No  . Sexual activity: Not Currently    Partners: Female  Other Topics Concern  . Not on file  Social History Narrative   Patient is married with 5 children.    Patient is right handed.   Patient has hs education.   Patient drinks 4 12 oz can sodas daily.   Social Determinants of Health   Financial Resource Strain:   . Difficulty of Paying Living Expenses: Not on file  Food Insecurity:   . Worried About Programme researcher, broadcasting/film/video in the Last Year: Not on file  . Ran Out of Food in the Last Year: Not on file  Transportation Needs:   . Lack of Transportation (Medical): Not on file  . Lack of Transportation (Non-Medical): Not on file  Physical Activity:   . Days of Exercise per Week: Not on file  . Minutes of Exercise per Session: Not on file  Stress:   . Feeling of Stress : Not on file  Social Connections:   . Frequency of Communication with Friends and Family: Not on file  . Frequency of Social Gatherings with Friends and Family: Not on file  . Attends Religious Services: Not on file  . Active Member of Clubs or Organizations: Not on file  . Attends Banker Meetings: Not on file  .  Marital Status: Not on file     Family History: The patient's family history includes Asthma in his brother; Cancer in his father; Colon cancer in his brother and sister; Diabetes in his brother, daughter, maternal grandfather, and mother; Gout in his brother and maternal uncle; Heart attack in his father; Heart disease in his father and mother; Hyperlipidemia in his brother, mother, and sister; Hypertension in his brother, mother, and sister; Stroke in his paternal grandfather. ROS:   Please see the history of present illness.    All other systems reviewed and are  negative.  EKGs/Labs/Other Studies Reviewed:    The following studies were reviewed today:   Recent Labs: 08/27/2019: ALT 48 10/19/2019: Hemoglobin 11.4; Platelets 305 06/20/2020: BUN 19; Creatinine, Ser 0.98; NT-Pro BNP 268; Potassium 3.7; Sodium 142  Recent Lipid Panel    Component Value Date/Time   CHOL 146 08/27/2017 1039   TRIG 119 08/27/2017 1039   HDL 34 (L) 08/27/2017 1039   CHOLHDL 4.3 08/27/2017 1039   CHOLHDL 3.9 03/22/2015 1136   VLDL 44 (H) 03/22/2015 1136   LDLCALC 88 08/27/2017 1039    Physical Exam:    VS:  BP (!) 142/80   Pulse 84   Ht 5\' 6"  (1.676 m)   Wt 239 lb (108.4 kg)   SpO2 93%   BMI 38.58 kg/m     Wt Readings from Last 3 Encounters:  08/04/20 239 lb (108.4 kg)  06/20/20 253 lb (114.8 kg)  05/16/20 248 lb 6.4 oz (112.7 kg)     GEN: He looks markedly improved well nourished, well developed in no acute distress HEENT: Normal NECK: No JVD; No carotid bruits LYMPHATICS: No lymphadenopathy CARDIAC: RRR, no murmurs, rubs, gallops RESPIRATORY:  Clear to auscultation without rales, wheezing or rhonchi  ABDOMEN: Soft, non-tender, non-distended MUSCULOSKELETAL:  No edema; No deformity  SKIN: Warm and dry NEUROLOGIC:  Alert and oriented x 3 PSYCHIATRIC:  Normal affect    Signed, 07/16/20, MD  08/04/2020 8:24 AM     [facility]

## 2020-08-04 ENCOUNTER — Ambulatory Visit: Payer: Medicare HMO | Admitting: Cardiology

## 2020-08-04 ENCOUNTER — Other Ambulatory Visit: Payer: Self-pay

## 2020-08-04 ENCOUNTER — Encounter: Payer: Self-pay | Admitting: Cardiology

## 2020-08-04 VITALS — BP 142/80 | HR 84 | Ht 66.0 in | Wt 239.0 lb

## 2020-08-04 DIAGNOSIS — I5042 Chronic combined systolic (congestive) and diastolic (congestive) heart failure: Secondary | ICD-10-CM | POA: Diagnosis not present

## 2020-08-04 DIAGNOSIS — I110 Hypertensive heart disease with heart failure: Secondary | ICD-10-CM | POA: Diagnosis not present

## 2020-08-04 DIAGNOSIS — I38 Endocarditis, valve unspecified: Secondary | ICD-10-CM

## 2020-08-04 DIAGNOSIS — I252 Old myocardial infarction: Secondary | ICD-10-CM

## 2020-08-04 DIAGNOSIS — I2510 Atherosclerotic heart disease of native coronary artery without angina pectoris: Secondary | ICD-10-CM | POA: Diagnosis not present

## 2020-08-04 NOTE — Patient Instructions (Signed)
Medication Instructions:  Your physician recommends that you continue on your current medications as directed. Please refer to the Current Medication list given to you today.  *If you need a refill on your cardiac medications before your next appointment, please call your pharmacy*   Lab Work: Your physician recommends that you return for lab work in: TODAY BMP, ProBNP If you have labs (blood work) drawn today and your tests are completely normal, you will receive your results only by: . MyChart Message (if you have MyChart) OR . A paper copy in the mail If you have any lab test that is abnormal or we need to change your treatment, we will call you to review the results.   Testing/Procedures: None   Follow-Up: At [facility], you and your health needs are our priority.  As part of our continuing mission to provide you with exceptional heart care, we have created designated Provider Care Teams.  These Care Teams include your primary Cardiologist (physician) and Advanced Practice Providers (APPs -  Physician Assistants and Nurse Practitioners) who all work together to provide you with the care you need, when you need it.  We recommend signing up for the patient portal called "MyChart".  Sign up information is provided on this After Visit Summary.  MyChart is used to connect with patients for Virtual Visits (Telemedicine).  Patients are able to view lab/test results, encounter notes, upcoming appointments, etc.  Non-urgent messages can be sent to your provider as well.   To learn more about what you can do with MyChart, go to [email or web address].    Your next appointment:   3 month(s)  The format for your next appointment:   In Person  Provider:   Brian Munley, MD   Other Instructions   

## 2020-08-05 LAB — BASIC METABOLIC PANEL
BUN/Creatinine Ratio: 22 (ref 10–24)
BUN: 31 mg/dL — ABNORMAL HIGH (ref 8–27)
CO2: 30 mmol/L — ABNORMAL HIGH (ref 20–29)
Calcium: 9.5 mg/dL (ref 8.6–10.2)
Chloride: 95 mmol/L — ABNORMAL LOW (ref 96–106)
Creatinine, Ser: 1.38 mg/dL — ABNORMAL HIGH (ref 0.76–1.27)
GFR calc Af Amer: 63 mL/min/{1.73_m2} (ref >59)
GFR calc non Af Amer: 55 mL/min/{1.73_m2} — ABNORMAL LOW (ref >59)
Glucose: 117 mg/dL — ABNORMAL HIGH (ref 65–99)
Potassium: 3.6 mmol/L (ref 3.5–5.2)
Sodium: 147 mmol/L — ABNORMAL HIGH (ref 134–144)

## 2020-08-05 LAB — PRO B NATRIURETIC PEPTIDE: NT-Pro BNP: 180 pg/mL (ref 0–210)

## 2020-08-07 ENCOUNTER — Telehealth: Payer: Self-pay

## 2020-08-07 NOTE — Telephone Encounter (Signed)
-----   Message from Baldo Daub, MD sent at 08/06/2020 11:43 AM EDT ----- Labs are good no changes

## 2020-08-07 NOTE — Telephone Encounter (Signed)
Left message on patients voicemail to please return our call.   

## 2020-08-07 NOTE — Telephone Encounter (Signed)
Spoke with patients wife regarding results and recommendation. ° °She verbalizes understanding and is agreeable to plan of care. Advised patient to call back with any issues or concerns.  °

## 2020-08-07 NOTE — Telephone Encounter (Signed)
° ° °  Pt's wife returning call from [place]

## 2020-09-16 ENCOUNTER — Other Ambulatory Visit: Payer: Self-pay | Admitting: Cardiology

## 2020-09-16 DIAGNOSIS — I110 Hypertensive heart disease with heart failure: Secondary | ICD-10-CM

## 2020-09-16 DIAGNOSIS — I25118 Atherosclerotic heart disease of native coronary artery with other forms of angina pectoris: Secondary | ICD-10-CM

## 2020-09-18 DIAGNOSIS — G43909 Migraine, unspecified, not intractable, without status migrainosus: Secondary | ICD-10-CM | POA: Insufficient documentation

## 2020-09-18 DIAGNOSIS — G629 Polyneuropathy, unspecified: Secondary | ICD-10-CM | POA: Insufficient documentation

## 2020-09-18 DIAGNOSIS — E11319 Type 2 diabetes mellitus with unspecified diabetic retinopathy without macular edema: Secondary | ICD-10-CM | POA: Insufficient documentation

## 2020-09-28 ENCOUNTER — Other Ambulatory Visit: Payer: Self-pay | Admitting: Gastroenterology

## 2020-10-19 DIAGNOSIS — I69354 Hemiplegia and hemiparesis following cerebral infarction affecting left non-dominant side: Secondary | ICD-10-CM | POA: Insufficient documentation

## 2020-10-20 ENCOUNTER — Telehealth: Payer: Self-pay | Admitting: Cardiology

## 2020-10-20 NOTE — Telephone Encounter (Signed)
Pts wife Darl Pikes called to report that the pt saw his PCP today Dr. Sol Passer and he wants to make some med changes that she would feel better knowing Dr. Dulce Sellar is okay with:  Change Pravachol to Atorvastatin Decrease Demadex to 20 mg daily Stop Metolazone  She says she was told the pt had decreased kidney function tests and low K  I advised her that Dr. Dulce Sellar is out of the office this afternoon but will be back next week to review her message but I recommended that she goes ahead and makes these changes that Dr. Sol Passer had advised. She is also having him send the labs to Dr. Dulce Sellar for his review.   (I have not made the med changes until Dr. Dulce Sellar reviews.)

## 2020-10-20 NOTE — Telephone Encounter (Signed)
Pt wife called in and stated that pt went to see his pcp yesterday and he wanted to change his fluid pill and change him from the pravastatin (PRAVACHOL) 20 MG tablet [[id number]] to Atrovastatin . She would like to this by DR Dulce Sellar before this is changed    Best number to reach wife - [phone]

## 2020-10-22 ENCOUNTER — Other Ambulatory Visit: Payer: Self-pay | Admitting: Gastroenterology

## 2020-10-22 NOTE — Telephone Encounter (Signed)
I think that a atorvastatin is a good idea  I would keep his diuretic sign  Start potassium is was diminished at 3.1 and Dr. Robyne Peers office 20 mEq 1 daily

## 2020-10-23 MED ORDER — POTASSIUM CHLORIDE CRYS ER 20 MEQ PO TBCR: 20.0000 meq | EXTENDED_RELEASE_TABLET | Freq: Every day | ORAL | 3 refills | Status: DC

## 2020-10-23 NOTE — Telephone Encounter (Signed)
Spoke with the patients wife just now and let her know Dr. Hulen Shouts recommendations. She verbalizes understanding and thanks me for the call back.

## 2020-10-23 NOTE — Addendum Note (Signed)
Addended by: Delorse Limber I on: 10/23/2020 03:52 PM   Modules accepted: Orders

## 2020-10-23 NOTE — Telephone Encounter (Signed)
Left message on patients voicemail to please return our call.   

## 2020-10-30 DIAGNOSIS — A0472 Enterocolitis due to Clostridium difficile, not specified as recurrent: Secondary | ICD-10-CM | POA: Insufficient documentation

## 2020-10-30 DIAGNOSIS — G8929 Other chronic pain: Secondary | ICD-10-CM | POA: Insufficient documentation

## 2020-10-30 DIAGNOSIS — I2510 Atherosclerotic heart disease of native coronary artery without angina pectoris: Secondary | ICD-10-CM | POA: Insufficient documentation

## 2020-10-30 DIAGNOSIS — M542 Cervicalgia: Secondary | ICD-10-CM | POA: Insufficient documentation

## 2020-11-08 ENCOUNTER — Ambulatory Visit: Payer: Medicare HMO | Admitting: Cardiology

## 2020-11-08 ENCOUNTER — Other Ambulatory Visit: Payer: Self-pay

## 2020-11-08 ENCOUNTER — Encounter: Payer: Self-pay | Admitting: Cardiology

## 2020-11-08 VITALS — Ht 66.0 in | Wt 240.0 lb

## 2020-11-08 DIAGNOSIS — E785 Hyperlipidemia, unspecified: Secondary | ICD-10-CM | POA: Diagnosis not present

## 2020-11-08 DIAGNOSIS — I2510 Atherosclerotic heart disease of native coronary artery without angina pectoris: Secondary | ICD-10-CM

## 2020-11-08 DIAGNOSIS — Z794 Long term (current) use of insulin: Secondary | ICD-10-CM

## 2020-11-08 DIAGNOSIS — Z9861 Coronary angioplasty status: Secondary | ICD-10-CM

## 2020-11-08 DIAGNOSIS — E1140 Type 2 diabetes mellitus with diabetic neuropathy, unspecified: Secondary | ICD-10-CM | POA: Diagnosis not present

## 2020-11-08 DIAGNOSIS — I10 Essential (primary) hypertension: Secondary | ICD-10-CM | POA: Diagnosis not present

## 2020-11-08 HISTORY — DX: Morbid (severe) obesity due to excess calories: E66.01

## 2020-11-08 MED ORDER — POTASSIUM CHLORIDE CRYS ER 20 MEQ PO TBCR: 20.0000 meq | EXTENDED_RELEASE_TABLET | Freq: Two times a day (BID) | ORAL | 3 refills | Status: DC

## 2020-11-08 NOTE — Progress Notes (Signed)
Cardiology Office Note:    Date:  11/08/2020   ID:  Johnathan Keith, DOB 02-Aug-1961, MRN [id number]  PCP:  Olive Bass, MD  Cardiologist:  No primary care provider on file.  Electrophysiologist:  None   Referring MD: Olive Bass, MD   Chief Complaint  Patient presents with  . Follow-up   History of Present Illness:    Johnathan Keith is a 59 y.o. 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  The patient is here today for follow-up visit.  He tells me that he had had some leg swelling but with his current torsemide dose he has had significant improvement.  He recently had some lab work which I review from his PCP office show that he was hypokalemic.  He denies any chest pain or worsening shortness of breath.  Past Medical History:  Diagnosis Date  . Abdominal distention   . Abnormal CT scan 12/09/2018  . Abnormal nuclear cardiac imaging test    High-risk nuclear stress test   . Acute febrile illness 05/08/2019  . Acute pancreatitis 05/08/2019  . AKI (acute kidney injury) (HCC) 03/26/2020  . Arthritis   . Arthropathy of cervical facet joint 01/31/2019  . Bicuspid aortic valve 03/27/2020   Formatting of this note might be different from the original. 2020: ECHO  . Bilateral low back pain without sciatica 09/13/2015  . Bilateral lower extremity edema 08/18/2018   Bilateral lower extremity edema  Formatting of this note might be different from the original. Bilateral lower extremity edema  Last Assessment & Plan:  He does have 1-2+ pitting edema.  I am going to increase his Lasix to 80 mg a day, and will check a basic metabolic panel in 7 to 10 days.  He will see Baxter Hire back in 1 month for follow-up of lab  work.  Marland Kitchen BPH with obstruction/lower urinary tract symptoms 02/20/2016  . Bradycardia 04/07/2020  . CAD -S/P PCI-2016 11/30/2015   LAD DES 2016. Admitted to [facility] Sept 2017 with SOB, not felt to be in CHF, Myoview was negative for sichemia  Formatting of this note might be different from the original. Overview:  LAD DES 2016. Admitted to [facility] Sept 2017 with SOB, not felt to be in CHF, Myoview was negative for sichemia  Last Assessment & Plan:  History of CAD status post LAD stenting by myself in 2016 with re-intervention because o  . Carotid artery stenosis- 09/26/2014   Rt CEA Oct 2015-Dr Chen  . Carotid stenosis 04/07/2013   Rt CEA Oct 2015-Dr Chen  Last Assessment & Plan:  History of carotid artery disease status post right carotid endarterectomy by Dr. [name] October 2015 which he follows by duplex ultrasound. Formatting of this note might be different from the original. Rt CEA Oct 2015-Dr [name]  Last Assessment & Plan:  History of carotid artery disease status post right carotid endarterectomy by Dr. [name] October 2015  . Carpal tunnel syndrome 07/17/2015  . 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 Cerebrovascular accident (CVA) due to thrombosis of right middle cerebral artery (HCC) 07/04/2014   Questionable small stroke in 2014 with transient Lt sided weakness in setting of carotid disease.  Formatting of this note  might be different from the original. Overview:  Questionable small stroke in 2014 with transient Lt sided weakness in setting of carotid disease.  Last Assessment & Plan:  History of multiple strokes in the past dating back to 2014 with a recent stroke in April of this year a  . Cervical spine disease 04/07/2013  . Cervical spondylosis without myelopathy 01/31/2019  . Cholecystitis 06/24/2019  . Cholelithiases 12/09/2018  . Chronic diarrhea 12/09/2018  . Chronic neck and back pain   . Chronic pain disorder 01/31/2019  . Clostridium difficile diarrhea     negative culture 08/17/19  . Coronary artery disease    drug-eluting stents placed in proximal and mid LAD  . Coronary artery disease with history of myocardial infarction without history of CABG 04/07/2013  . DDD (degenerative disc disease), cervical 01/31/2019  . Degenerative joint disease of thoracic spine 12/09/2018  . Deviated septum   . Diabetes mellitus type 2, uncontrolled (HCC) 04/07/2013  . Diabetic polyneuropathy associated with type 2 diabetes mellitus (HCC) 01/31/2019  . Diabetic retinopathy (HCC)   . Dyslipidemia 12/09/2018  . Dyspnea on exertion 03/26/2020   Dyspnea on exertion  Formatting of this note might be different from the original. Overview:  Dyspnea on exertion  Last Assessment & Plan:  Improved after stenting of his LAD  . Elevated troponin 12/09/2018  . Erectile dysfunction 11/25/2015  . Essential hypertension 04/07/2013   hypertension  Formatting of this note might be different from the original. hypertension  Last Assessment & Plan:  History of essential hypertension her blood pressure measured at 162/70.  He is on benazepril 10 the morning and 20 in the evening as well as hydralazine, hydrochlorothiazide, and metoprolol.  Blood pressures have been on the high side.  I am going to increase his benazepril to 20 mg   . GERD (gastroesophageal reflux disease)    "occasionally" (03/23/2015)  . Headache    "more than 2/wk" (03/23/2015)  . Heart attack (HCC)    "in 1997 was told I had signs of a small heart attack that I didn't know I'd had"  . High cholesterol   . History of cardioembolic cerebrovascular accident (CVA) 12/09/2018  . History of gout    "when I was younger"  . History of kidney stones    passed  . History of stroke 04/20/2018  . Hyperlipidemia, unspecified 04/07/2013   hyperlipidemia  Formatting of this note might be different from the original. IMO routine update IMO routine update hyperlipidemia  Last Assessment & Plan:  History of hyperlipidemia on pravastatin with  lipid profile performed by his PCP 03/17/2018 revealing total cholesterol 126, LDL 55 HDL 24. Formatting of this note might be different from the original. hyperlipidemia  Last Assessment & Plan:  Hi  . Hypertension   . 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 Kidney disease   . Large liver 12/09/2018  . Lower back pain 06/14/2015  . Lumbar facet joint pain 06/14/2015  . Lumbar foraminal stenosis 01/31/2019  . Lymphocytosis 05/24/2015  . Memory loss 03/15/2020  . Migraine    "had my 1st and only in 12/2014" (03/23/2015)  . Neural foraminal stenosis of cervical spine 01/31/2019  . Neutrophilia 05/24/2015  . Non morbid obesity due to excess calories 02/12/2016  . Occipital neuralgia of right side 12/18/2016  . Occlusion and stenosis of unspecified carotid artery 04/07/2013   Formatting of this note might be different from the original. Overview:  Rt CEA Oct 2015-Dr [name]  Last Assessment & Plan:  History of carotid artery disease status post right carotid endarterectomy by Dr. [name] October 2015 which he follows by duplex ultrasound.  . Peripheral neuropathy    feet  . Peripheral vascular disease (HCC)    carotid artery disease  . Pneumonia 11/19,1/20  . PONV (postoperative nausea and vomiting) 2015   "was told I was confused and combative in recovery from the narcotics w/my carotid OR"  "During a colonoscopy my oxygen level dropped so low that I had to be woke up.- 03/2017 colonoscopy [facility]   . Sepsis (HCC) 05/08/2019  . Serum total bilirubin elevated 05/08/2019  . Sleep apnea    "severe; couldn't afford mask" (03/23/2015) uses O2 at night.no C-pap  . SOB (shortness of breath) 05/04/2019  . Spondylosis of lumbar spine 01/31/2019  . Stroke [facility]) 2014   "on walker for 2 months; left side is weaker and numb since" (03/23/2015), "2 confirmed strokes"  . Stroke (HCC) 05/10/2019   Aphasia mild  . Stroke, lacunar (HCC) 04/07/2013  . Transaminitis 05/08/2019  . Type 2 diabetes mellitus with diabetic neuropathy (HCC)  12/09/2018  . Type 2 diabetes mellitus with diabetic neuropathy, with long-term current use of insulin (HCC) 07/04/2014  . Type 2 diabetes mellitus, with long-term current use of insulin (HCC) 02/12/2016  . Type II diabetes mellitus (HCC)     Past Surgical History:  Procedure Laterality Date  . BILIARY DILATION  08/11/2019   Procedure: BILIARY DILATION;  Surgeon: Meridee Score Netty Starring., MD;  Location: [facility] ENDOSCOPY;  Service: Gastroenterology;;  . BIOPSY  08/11/2019   Procedure: BIOPSY;  Surgeon: Lemar Lofty., MD;  Location: [facility] ENDOSCOPY;  Service: Gastroenterology;;  . CHOLECYSTECTOMY N/A 08/31/2019   Procedure: LAPAROSCOPIC CHOLECYSTECTOMY;  Surgeon: Gaynelle Adu, MD;  Location: [facility] ORS;  Service: General;  Laterality: N/A;  . CIRCUMCISION  1981  . COLONOSCOPY W/ POLYPECTOMY    . CORONARY ANGIOPLASTY WITH STENT PLACEMENT  03/23/2015   "2"  . CORONARY STENT INTERVENTION N/A 09/29/2017   Procedure: CORONARY STENT INTERVENTION;  Surgeon: Runell Gess, MD;  Location: [facility] INVASIVE CV LAB;  Service: Cardiovascular;  Laterality: N/A;  . ENDARTERECTOMY Right 09/26/2014   Procedure: RIGHT CAROTID ENDARTERECTOMY WITH PATCH ANGIOPLASTY;  Surgeon: Fransisco Hertz, MD;  Location: [facility] OR;  Service: Vascular;  Laterality: Right;  . ENDOSCOPIC RETROGRADE CHOLANGIOPANCREATOGRAPHY (ERCP) WITH PROPOFOL N/A 08/11/2019   Procedure: ENDOSCOPIC RETROGRADE CHOLANGIOPANCREATOGRAPHY (ERCP) WITH PROPOFOL;  Surgeon: Lemar Lofty., MD;  Location: [facility] ENDOSCOPY;  Service: Gastroenterology;  Laterality: N/A;  . ESOPHAGOGASTRODUODENOSCOPY (EGD) WITH PROPOFOL N/A 08/11/2019   Procedure: ESOPHAGOGASTRODUODENOSCOPY (EGD) WITH PROPOFOL;  Surgeon: Meridee Score Netty Starring., MD;  Location: [facility] ENDOSCOPY;  Service: Gastroenterology;  Laterality: N/A;  . EYE SURGERY Right May 2016   Cataract  . EYE SURGERY Left July 2016   Cataract  . HYDROCELE EXCISION  ~ 2008  . IR EXCHANGE BILIARY DRAIN  07/21/2019  . IR PERC  CHOLECYSTOSTOMY  05/11/2019  . IR RADIOLOGIST EVAL & MGMT  06/30/2019  . LEFT HEART CATH AND CORONARY ANGIOGRAPHY N/A 09/29/2017   Procedure: LEFT HEART CATH AND CORONARY ANGIOGRAPHY;  Surgeon: Runell Gess, MD;  Location: [facility] INVASIVE CV LAB;  Service: Cardiovascular;  Laterality: N/A;  . LEFT HEART CATHETERIZATION WITH CORONARY ANGIOGRAM N/A 03/23/2015   Procedure: LEFT HEART CATHETERIZATION WITH CORONARY ANGIOGRAM;  Surgeon: Runell Gess, MD;  Location: [facility] CATH LAB;  Service: Cardiovascular;  Laterality: N/A;  . REFRACTIVE SURGERY Bilateral 08/2014-09/2014   Diabetic retinopathy   . REMOVAL OF  STONES  08/11/2019   Procedure: REMOVAL OF STONES;  Surgeon: Meridee Score Netty Starring., MD;  Location: [facility] ENDOSCOPY;  Service: Gastroenterology;;  . Gaspar Bidding DILATION N/A 08/11/2019   Procedure: Gaspar Bidding DILATION;  Surgeon: Lemar Lofty., MD;  Location: [facility] ENDOSCOPY;  Service: Gastroenterology;  Laterality: N/A;  . SPHINCTEROTOMY  08/11/2019   Procedure: SPHINCTEROTOMY;  Surgeon: Meridee Score Netty Starring., MD;  Location: [facility] ENDOSCOPY;  Service: Gastroenterology;;    Current Medications: Current Meds  Medication Sig  . ALPRAZolam (XANAX) 0.25 MG tablet Take 0.25 mg by mouth at bedtime as needed for anxiety.  Marland Kitchen aspirin 81 MG tablet Take 1 tablet (81 mg total) by mouth daily.  Marland Kitchen atorvastatin (LIPITOR) 80 MG tablet Take 80 mg by mouth daily.  . bacitracin ointment bacitracin 500 unit/gram topical packet  APPLY OINTMENT EXTERNALLY TWICE DAILY TO WOUNDS  . Budeson-Glycopyrrol-Formoterol (BREZTRI AEROSPHERE) 160-9-4.8 MCG/ACT AERO Inhale into the lungs.  . cholecalciferol (VITAMIN D3) 25 MCG (1000 UT) tablet Take 1,000 Units by mouth daily.  . clopidogrel (PLAVIX) 75 MG tablet Take 1 tablet (75 mg total) by mouth daily.  . diphenoxylate-atropine (LOMOTIL) 2.5-0.025 MG tablet TAKE 1 TABLET BY MOUTH TWICE DAILY AS NEEDED FOR DIARRHEA OR  LOOSE  STOOL  . Fluticasone-Umeclidin-Vilant (TRELEGY ELLIPTA)  100-62.5-25 MCG/INH AEPB Inhale 1 puff into the lungs daily as needed.  . gabapentin (NEURONTIN) 300 MG capsule Take 300 mg by mouth at bedtime.  . insulin detemir (LEVEMIR) 100 UNIT/ML FlexPen Inject into the skin.  Marland Kitchen insulin lispro (HUMALOG) 100 UNIT/ML KwikPen Inject into the skin.  Marland Kitchen insulin NPH Human (NOVOLIN N) 100 UNIT/ML injection Inject 55 Units into the skin 2 (two) times daily.  . insulin regular (NOVOLIN R) 100 units/mL injection Inject 55 Units into the skin 2 (two) times daily. Pt is on a sliding scale  . ipratropium-albuterol (DUONEB) 0.5-2.5 (3) MG/3ML SOLN Inhale 3 mLs into the lungs 4 (four) times daily as needed for wheezing or shortness of breath.  . loperamide (IMODIUM A-D) 2 MG tablet Imodium A-D 2 mg tablet  use 1 pill after each bowel moement up to 4 daily  . metFORMIN (GLUCOPHAGE-XR) 750 MG 24 hr tablet Take 1 tablet by mouth in the morning and at bedtime.  . metoprolol succinate (TOPROL-XL) 50 MG 24 hr tablet Take 1 tablet (50 mg total) by mouth daily.  . montelukast (SINGULAIR) 10 MG tablet Take 10 mg by mouth at bedtime.  . nitroGLYCERIN (NITROSTAT) 0.4 MG SL tablet Place 1 tablet (0.4 mg total) under the tongue every 5 (five) minutes as needed for chest pain.  Marland Kitchen oxybutynin (DITROPAN) 5 MG tablet Take 5 mg by mouth at bedtime.   . pantoprazole (PROTONIX) 40 MG tablet Take 1 tablet by mouth once daily  . Polyvinyl Alcohol-Povidone (REFRESH OP) Place 1 drop into both eyes daily as needed (dry eyes).  . potassium chloride SA (KLOR-CON) 20 MEQ tablet Take 1 tablet (20 mEq total) by mouth 2 (two) times daily.  . tamsulosin (FLOMAX) 0.4 MG CAPS capsule TAKE ONE CAPSULE BY MOUTH TWICE DAILY 30 MINUTES AFTER MEAL  . torsemide (DEMADEX) 20 MG tablet Take 2 tablets (40 mg total) by mouth 2 (two) times daily.  . [DISCONTINUED] potassium chloride SA (KLOR-CON) 20 MEQ tablet Take 1 tablet (20 mEq total) by mouth daily.     Allergies:   Metformin   Social History    Socioeconomic History  . Marital status: Married    Spouse name: Not on file  . Number of  children: 5  . Years of education: 12th  . Highest education level: Not on file  Occupational History  . Occupation: Disabled  Tobacco Use  . Smoking status: Never Smoker  . Smokeless tobacco: Never Used  Vaping Use  . Vaping Use: Never used  Substance and Sexual Activity  . Alcohol use: No    Alcohol/week: 0.0 standard drinks  . Drug use: No  . Sexual activity: Not Currently    Partners: Female  Other Topics Concern  . Not on file  Social History Narrative   Patient is married with 5 children.    Patient is right handed.   Patient has hs education.   Patient drinks 4 12 oz can sodas daily.   Social Determinants of Health   Financial Resource Strain:   . Difficulty of Paying Living Expenses: Not on file  Food Insecurity:   . Worried About Programme researcher, broadcasting/film/video in the Last Year: Not on file  . Ran Out of Food in the Last Year: Not on file  Transportation Needs:   . Lack of Transportation (Medical): Not on file  . Lack of Transportation (Non-Medical): Not on file  Physical Activity:   . Days of Exercise per Week: Not on file  . Minutes of Exercise per Session: Not on file  Stress:   . Feeling of Stress : Not on file  Social Connections:   . Frequency of Communication with Friends and Family: Not on file  . Frequency of Social Gatherings with Friends and Family: Not on file  . Attends Religious Services: Not on file  . Active Member of Clubs or Organizations: Not on file  . Attends Banker Meetings: Not on file  . Marital Status: Not on file     Family History: The patient's family history includes Asthma in his brother; Cancer in his father; Colon cancer in his brother and sister; Diabetes in his brother, daughter, maternal grandfather, and mother; Gout in his brother and maternal uncle; Heart attack in his father; Heart disease in his father and mother;  Hyperlipidemia in his brother, mother, and sister; Hypertension in his brother, mother, and sister; Stroke in his paternal grandfather.  ROS:   Review of Systems  Constitution: Negative for decreased appetite, fever and weight gain.  HENT: Negative for congestion, ear discharge, hoarse voice and sore throat.   Eyes: Negative for discharge, redness, vision loss in right eye and visual halos.  Cardiovascular: Negative for chest pain, dyspnea on exertion, leg swelling, orthopnea and palpitations.  Respiratory: Negative for cough, hemoptysis, shortness of breath and snoring.   Endocrine: Negative for heat intolerance and polyphagia.  Hematologic/Lymphatic: Negative for bleeding problem. Does not bruise/bleed easily.  Skin: Negative for flushing, nail changes, rash and suspicious lesions.  Musculoskeletal: Negative for arthritis, joint pain, muscle cramps, myalgias, neck pain and stiffness.  Gastrointestinal: Negative for abdominal pain, bowel incontinence, diarrhea and excessive appetite.  Genitourinary: Negative for decreased libido, genital sores and incomplete emptying.  Neurological: Negative for brief paralysis, focal weakness, headaches and loss of balance.  Psychiatric/Behavioral: Negative for altered mental status, depression and suicidal ideas.  Allergic/Immunologic: Negative for HIV exposure and persistent infections.    EKGs/Labs/Other Studies Reviewed:    The following studies were reviewed today:   EKG: None today   Transthoracic echocardiogram IMPRESSIONS  1. The left ventricle has mild-moderately reduced systolic function, with an ejection fraction of 40-45%. The cavity size was normal. Left ventricular diastolic Doppler parameters are consistent with impaired relaxation.  Left ventricular diffuse  hypokinesis.  2. The right ventricle has normal systolic function. The cavity was normal. There is no increase in right ventricular wall thickness.  3. Left atrial size was  mildly dilated.  4. The aortic valve has an indeterminate number of cusps. Aortic valve regurgitation is mild by color flow Doppler.  5. The aortic valve appears functionally bicuspid.  6. The aortic root and ascending aorta are normal in size and structure.  7. The interatrial septum was not well visualized.   Recent Labs: 08/04/2020: BUN 31; Creatinine, Ser 1.38; NT-Pro BNP 180; Potassium 3.6; Sodium 147  Recent Lipid Panel    Component Value Date/Time   CHOL 146 08/27/2017 1039   TRIG 119 08/27/2017 1039   HDL 34 (L) 08/27/2017 1039   CHOLHDL 4.3 08/27/2017 1039   CHOLHDL 3.9 03/22/2015 1136   VLDL 44 (H) 03/22/2015 1136   LDLCALC 88 08/27/2017 1039    Physical Exam:    VS:  Ht 5\' 6"  (1.676 m)   Wt 240 lb (108.9 kg)   BMI 38.74 kg/m     Wt Readings from Last 3 Encounters:  11/08/20 240 lb (108.9 kg)  08/04/20 239 lb (108.4 kg)  06/20/20 253 lb (114.8 kg)     GEN: Well nourished, well developed in no acute distress HEENT: Normal NECK: No JVD; No carotid bruits LYMPHATICS: No lymphadenopathy CARDIAC: S1S2 noted,RRR, no murmurs, rubs, gallops RESPIRATORY:  Clear to auscultation without rales, wheezing or rhonchi  ABDOMEN: Soft, non-tender, non-distended, +bowel sounds, no guarding. EXTREMITIES: No edema, No cyanosis, no clubbing MUSCULOSKELETAL:  No deformity  SKIN: Warm and dry NEUROLOGIC:  Alert and oriented x 3, non-focal PSYCHIATRIC:  Normal affect, good insight  ASSESSMENT:    1. CAD -S/P PCI-2016   2. Essential hypertension   3. Type 2 diabetes mellitus with diabetic neuropathy, with long-term current use of insulin (HCC)   4. Dyslipidemia   5. Morbid obesity (HCC)    PLAN:    The patient does not appear to be volume overloaded he has treatment bilateral leg edema.  I like to continue patient on his current loop diuretic dosing.  I reviewed his recent lab at his PCP office potassium 3.2, creatinine 1.66. He is hypokalemic I am going to have him take  his potassium KCl 20 mEq twice daily for now.  He does take torsemide 40 mg twice daily.  I have asked the patient to come back in 1 week for repeat BMP.  Hopefully his kidney function is stable and if his creatinine continues to go up we might cut back on his loop diuretic as well as his metolazone that he takes twice weekly.  No angina symptoms.  Continue Plavix, statin and beta-blocker  The patient understands the need to lose weight with diet and exercise. We have discussed specific strategies for this.  The patient is in agreement with the above plan. The patient left the office in stable condition.  The patient will follow up in 8 weeks with Dr. 06/22/20   Medication Adjustments/Labs and Tests Ordered: Current medicines are reviewed at length with the patient today.  Concerns regarding medicines are outlined above.  Orders Placed This Encounter  Procedures  . Basic metabolic panel  . Magnesium   Meds ordered this encounter  Medications  . potassium chloride SA (KLOR-CON) 20 MEQ tablet    Sig: Take 1 tablet (20 mEq total) by mouth 2 (two) times daily.    Dispense:  180 tablet  Refill:  3    Patient Instructions  Medication Instructions:  Your physician has recommended you make the following change in your medication: INCREASE: Potassium chloride 20 meq take one tablet by mouth twice daily.  *If you need a refill on your cardiac medications before your next appointment, please call your pharmacy*   Lab Work: Your physician recommends that you return for lab work in: 1 week BMP, Mag If you have labs (blood work) drawn today and your tests are completely normal, you will receive your results only by: Marland Kitchen MyChart Message (if you have MyChart) OR . A paper copy in the mail If you have any lab test that is abnormal or we need to change your treatment, we will call you to review the results.   Testing/Procedures: None   Follow-Up: At [facility], you and your health needs  are our priority.  As part of our continuing mission to provide you with exceptional heart care, we have created designated Provider Care Teams.  These Care Teams include your primary Cardiologist (physician) and Advanced Practice Providers (APPs -  Physician Assistants and Nurse Practitioners) who all work together to provide you with the care you need, when you need it.  We recommend signing up for the patient portal called "MyChart".  Sign up information is provided on this After Visit Summary.  MyChart is used to connect with patients for Virtual Visits (Telemedicine).  Patients are able to view lab/test results, encounter notes, upcoming appointments, etc.  Non-urgent messages can be sent to your provider as well.   To learn more about what you can do with MyChart, go to [email or web address].    Your next appointment:   8 weeks  The format for your next appointment:   In Person  Provider:   Norman Herrlich, MD   Other Instructions      Adopting a Healthy Lifestyle.  Know what a healthy weight is for you (roughly BMI <25) and aim to maintain this   Aim for 7+ servings of fruits and vegetables daily   65-80+ fluid ounces of water or unsweet tea for healthy kidneys   Limit to max 1 drink of alcohol per day; avoid smoking/tobacco   Limit animal fats in diet for cholesterol and heart health - choose grass fed whenever available   Avoid highly processed foods, and foods high in saturated/trans fats   Aim for low stress - take time to unwind and care for your mental health   Aim for 150 min of moderate intensity exercise weekly for heart health, and weights twice weekly for bone health   Aim for 7-9 hours of sleep daily   When it comes to diets, agreement about the perfect plan isnt easy to find, even among the experts. Experts at the [facility] of [employer] developed an idea known as the Healthy Eating Plate. Just imagine a plate divided into logical, healthy  portions.   The emphasis is on diet quality:   Load up on vegetables and fruits - one-half of your plate: Aim for color and variety, and remember that potatoes dont count.   Go for whole grains - one-quarter of your plate: Whole wheat, barley, wheat berries, quinoa, oats, brown rice, and foods made with them. If you want pasta, go with whole wheat pasta.   Protein power - one-quarter of your plate: Fish, chicken, beans, and nuts are all healthy, versatile protein sources. Limit red meat.   The diet, however, does go beyond the plate,  offering a few other suggestions.   Use healthy plant oils, such as olive, canola, soy, corn, sunflower and peanut. Check the labels, and avoid partially hydrogenated oil, which have unhealthy trans fats.   If youre thirsty, drink water. Coffee and tea are good in moderation, but skip sugary drinks and limit milk and dairy products to one or two daily servings.   The type of carbohydrate in the diet is more important than the amount. Some sources of carbohydrates, such as vegetables, fruits, whole grains, and beans-are healthier than others.   Finally, stay active  Signed, Thomasene Ripple, DO  11/08/2020 4:10 PM    [facility]

## 2020-11-08 NOTE — Patient Instructions (Signed)
Medication Instructions:  Your physician has recommended you make the following change in your medication: INCREASE: Potassium chloride 20 meq take one tablet by mouth twice daily.  *If you need a refill on your cardiac medications before your next appointment, please call your pharmacy*   Lab Work: Your physician recommends that you return for lab work in: 1 week BMP, Mag If you have labs (blood work) drawn today and your tests are completely normal, you will receive your results only by: Marland Kitchen MyChart Message (if you have MyChart) OR . A paper copy in the mail If you have any lab test that is abnormal or we need to change your treatment, we will call you to review the results.   Testing/Procedures: None   Follow-Up: At [facility], you and your health needs are our priority.  As part of our continuing mission to provide you with exceptional heart care, we have created designated Provider Care Teams.  These Care Teams include your primary Cardiologist (physician) and Advanced Practice Providers (APPs -  Physician Assistants and Nurse Practitioners) who all work together to provide you with the care you need, when you need it.  We recommend signing up for the patient portal called "MyChart".  Sign up information is provided on this After Visit Summary.  MyChart is used to connect with patients for Virtual Visits (Telemedicine).  Patients are able to view lab/test results, encounter notes, upcoming appointments, etc.  Non-urgent messages can be sent to your provider as well.   To learn more about what you can do with MyChart, go to [email or web address].    Your next appointment:   8 weeks  The format for your next appointment:   In Person  Provider:   Norman Herrlich, MD   Other Instructions

## 2020-11-15 ENCOUNTER — Other Ambulatory Visit: Payer: Self-pay

## 2020-11-15 DIAGNOSIS — I10 Essential (primary) hypertension: Secondary | ICD-10-CM

## 2020-11-15 LAB — BASIC METABOLIC PANEL
BUN/Creatinine Ratio: 26 — ABNORMAL HIGH (ref 10–24)
BUN: 35 mg/dL — ABNORMAL HIGH (ref 8–27)
CO2: 26 mmol/L (ref 20–29)
Calcium: 9.2 mg/dL (ref 8.6–10.2)
Chloride: 99 mmol/L (ref 96–106)
Creatinine, Ser: 1.36 mg/dL — ABNORMAL HIGH (ref 0.76–1.27)
GFR calc Af Amer: 64 mL/min/{1.73_m2} (ref >59)
GFR calc non Af Amer: 56 mL/min/{1.73_m2} — ABNORMAL LOW (ref >59)
Glucose: 86 mg/dL (ref 65–99)
Potassium: 3.9 mmol/L (ref 3.5–5.2)
Sodium: 146 mmol/L — ABNORMAL HIGH (ref 134–144)

## 2020-11-15 LAB — MAGNESIUM: Magnesium: 2.2 mg/dL (ref 1.6–2.3)

## 2020-11-16 ENCOUNTER — Telehealth: Payer: Self-pay

## 2020-11-16 NOTE — Telephone Encounter (Signed)
-----   Message from Baldo Daub, MD sent at 11/16/2020  7:53 AM EST ----- Good result stable CKD no changes

## 2020-11-16 NOTE — Telephone Encounter (Signed)
Spoke with patient regarding results and recommendation.  Patient verbalizes understanding and is agreeable to plan of care. Advised patient to call back with any issues or concerns.  

## 2020-11-23 ENCOUNTER — Other Ambulatory Visit: Payer: Self-pay | Admitting: Gastroenterology

## 2020-11-23 DIAGNOSIS — A0472 Enterocolitis due to Clostridium difficile, not specified as recurrent: Secondary | ICD-10-CM | POA: Insufficient documentation

## 2020-11-30 DIAGNOSIS — K529 Noninfective gastroenteritis and colitis, unspecified: Secondary | ICD-10-CM

## 2020-11-30 HISTORY — DX: Noninfective gastroenteritis and colitis, unspecified: K52.9

## 2020-12-04 ENCOUNTER — Other Ambulatory Visit: Payer: Self-pay

## 2020-12-04 MED ORDER — CLOPIDOGREL BISULFATE 75 MG PO TABS
75.0000 mg | ORAL_TABLET | Freq: Every day | ORAL | 3 refills | Status: DC
Start: 2020-12-04 — End: 2024-05-05

## 2020-12-11 DIAGNOSIS — I10 Essential (primary) hypertension: Secondary | ICD-10-CM | POA: Insufficient documentation

## 2020-12-11 DIAGNOSIS — N289 Disorder of kidney and ureter, unspecified: Secondary | ICD-10-CM | POA: Insufficient documentation

## 2020-12-12 ENCOUNTER — Ambulatory Visit: Payer: Medicare HMO | Admitting: Cardiology

## 2020-12-13 ENCOUNTER — Ambulatory Visit: Payer: Medicare HMO | Admitting: Cardiology

## 2020-12-13 ENCOUNTER — Other Ambulatory Visit: Payer: Self-pay

## 2020-12-13 ENCOUNTER — Encounter: Payer: Self-pay | Admitting: Cardiology

## 2020-12-13 VITALS — BP 120/82 | HR 101 | Ht 66.0 in | Wt 245.0 lb

## 2020-12-13 DIAGNOSIS — I739 Peripheral vascular disease, unspecified: Secondary | ICD-10-CM | POA: Diagnosis not present

## 2020-12-13 DIAGNOSIS — E1140 Type 2 diabetes mellitus with diabetic neuropathy, unspecified: Secondary | ICD-10-CM | POA: Diagnosis not present

## 2020-12-13 DIAGNOSIS — G4730 Sleep apnea, unspecified: Secondary | ICD-10-CM

## 2020-12-13 DIAGNOSIS — I252 Old myocardial infarction: Secondary | ICD-10-CM

## 2020-12-13 DIAGNOSIS — I2510 Atherosclerotic heart disease of native coronary artery without angina pectoris: Secondary | ICD-10-CM

## 2020-12-13 DIAGNOSIS — I10 Essential (primary) hypertension: Secondary | ICD-10-CM | POA: Diagnosis not present

## 2020-12-13 DIAGNOSIS — Z794 Long term (current) use of insulin: Secondary | ICD-10-CM

## 2020-12-13 NOTE — Progress Notes (Signed)
Cardiology Office Note:    Date:  12/13/2020   ID:  Johnathan Keith, DOB 02-May-1961, MRN [id number]  PCP:  Olive Bass, MD  Cardiologist:  Garwin Brothers, MD   Referring MD: Olive Bass, MD    ASSESSMENT:    1. Coronary artery disease with history of myocardial infarction without history of CABG   2. Peripheral vascular disease (HCC)   3. Primary hypertension   4. Type 2 diabetes mellitus with diabetic neuropathy, with long-term current use of insulin (HCC)   5. Sleep apnea, unspecified type   6. Morbid obesity (HCC)    PLAN:    In order of problems listed above:  1. Coronary artery disease: Secondary prevention stressed with the patient.  Importance of compliance with diet medication stressed any vocalized understanding. 2. Congestive heart failure: I discussed findings with the patient at length.  Diet was emphasized.  He promises to do better.  He has gained 5 pounds in the past month.  I reviewed records from primary care physician and lab work at extensive length.  He has history of hypokalemia and this is managed by primary care physician. 3. Essential hypertension: Blood pressure stable and diet was emphasized. 4. Diabetes mellitus and morbid obesity: Diet emphasized weight reduction stressed.  These issues are closely monitored by primary care physician. 5. Sleep apnea: Sleep health issues were discussed with 6. Patient will be seen in follow-up appointment in 6 months or earlier if the patient has any concerns    Medication Adjustments/Labs and Tests Ordered: Current medicines are reviewed at length with the patient today.  Concerns regarding medicines are outlined above.  No orders of the defined types were placed in this encounter.  No orders of the defined types were placed in this encounter.    No chief complaint on file.    History of Present Illness:    Johnathan Keith is a 60 y.o. male.  Patient has past medical history of coronary artery disease,  essential hypertension dyslipidemia diabetes mellitus and renal insufficiency.  He has had a history of stroke.  Patient is overall in poor general condition and frail.  He is morbidly obese.  His son has brought him in a wheelchair.  He was referred back by Dr. Carney Corners for evaluation.  Patient denies any chest pain orthopnea PND or shortness of breath.  At the time of my evaluation, the patient is alert awake oriented and in no distress.  Past Medical History:  Diagnosis Date  . Abdominal distention   . Abnormal CT scan 12/09/2018  . Abnormal nuclear cardiac imaging test    High-risk nuclear stress test   . Acute febrile illness 05/08/2019  . Acute pancreatitis 05/08/2019  . Adjustment disorder with mixed anxiety and depressed mood 07/27/2020  . AKI (acute kidney injury) (HCC) 03/26/2020  . Arthritis   . Arthropathy of cervical facet joint 01/31/2019  . Bicuspid aortic valve 03/27/2020   Formatting of this note might be different from the original. 2020: ECHO  . Bilateral low back pain without sciatica 09/13/2015  . Bilateral lower extremity edema 08/18/2018   Bilateral lower extremity edema  Formatting of this note might be different from the original. Bilateral lower extremity edema  Last Assessment & Plan:  He does have 1-2+ pitting edema.  I am going to increase his Lasix to 80 mg a day, and will check a basic metabolic panel in 7 to 10 days.  He will see Baxter Hire back in 1 month for  follow-up of lab work.  Marland Kitchen BPH with obstruction/lower urinary tract symptoms 02/20/2016  . Bradycardia 04/07/2020  . CAD -S/P PCI-2016 11/30/2015   LAD DES 2016. Admitted to [facility] Sept 2017 with SOB, not felt to be in CHF, Myoview was negative for sichemia  Formatting of this note might be different from the original. Overview:  LAD DES 2016. Admitted to [facility] Sept 2017 with SOB, not felt to be in CHF, Myoview was negative for sichemia  Last Assessment & Plan:  History of CAD status post LAD stenting by myself in 2016 with  re-intervention because o  . Carotid artery stenosis- 09/26/2014   Rt CEA Oct 2015-Dr Chen  . Carotid stenosis 04/07/2013   Rt CEA Oct 2015-Dr Chen  Last Assessment & Plan:  History of carotid artery disease status post right carotid endarterectomy by Dr. [name] October 2015 which he follows by duplex ultrasound. Formatting of this note might be different from the original. Rt CEA Oct 2015-Dr [name]  Last Assessment & Plan:  History of carotid artery disease status post right carotid endarterectomy by Dr. [name] October 2015  . Carpal tunnel syndrome 07/17/2015  . 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 Cerebrovascular accident (CVA) due to thrombosis of right middle cerebral artery (HCC) 07/04/2014   Questionable small stroke in 2014 with transient Lt sided weakness in setting of carotid disease.  Formatting of this note might be different from the original. Overview:  Questionable small stroke in 2014 with transient Lt sided weakness in setting of carotid disease.  Last Assessment & Plan:  History of multiple strokes in the past dating back to 2014 with a recent stroke in April of this year a  . Cervical spine disease 04/07/2013  . Cervical spondylosis without myelopathy 01/31/2019  . Cholecystitis 06/24/2019  . Cholelithiases 12/09/2018  . Chronic diarrhea 12/09/2018  . Chronic hypoxemic respiratory failure (HCC) 06/19/2020  . Chronic neck and back pain   . Chronic pain disorder 01/31/2019  . Clostridium difficile diarrhea    negative culture 08/17/19  . Community acquired pneumonia 06/19/2020   Formatting of this note might be different from the original. 2021: hosp  . Coronary artery disease    drug-eluting stents placed in proximal and mid LAD  . Coronary artery disease with history of myocardial infarction without history of CABG 04/07/2013  . DDD (degenerative disc disease), cervical 01/31/2019  . Degenerative joint disease of thoracic spine 12/09/2018  .  Deviated septum   . Diabetes mellitus type 2, uncontrolled (HCC) 04/07/2013  . Diabetic polyneuropathy associated with type 2 diabetes mellitus (HCC) 01/31/2019  . Diabetic retinopathy (HCC)   . Dyslipidemia 12/09/2018  . Dyspnea on exertion 03/26/2020   Dyspnea on exertion  Formatting of this note might be different from the original. Overview:  Dyspnea on exertion  Last Assessment & Plan:  Improved after stenting of his LAD  . Elevated troponin 12/09/2018  . Enteritis 11/30/2020   Formatting of this note might be different from the original. 11/30/2020:  . Erectile dysfunction 11/25/2015  . Essential hypertension 04/07/2013   hypertension  Formatting of this note might be different from the original. hypertension  Last Assessment & Plan:  History of essential hypertension her blood pressure measured at 162/70.  He is on benazepril 10 the morning and 20 in the evening as well as hydralazine, hydrochlorothiazide, and metoprolol.  Blood pressures have been on the high side.  I am going to increase his benazepril to  20 mg   . GERD (gastroesophageal reflux disease)    "occasionally" (03/23/2015)  . Headache    "more than 2/wk" (03/23/2015)  . Heart attack (HCC)    "in 1997 was told I had signs of a small heart attack that I didn't know I'd had"  . High cholesterol   . History of cardioembolic cerebrovascular accident (CVA) 12/09/2018  . History of gout    "when I was younger"  . History of kidney stones    passed  . History of stroke 04/20/2018  . Hyperlipidemia, unspecified 04/07/2013   hyperlipidemia  Formatting of this note might be different from the original. IMO routine update IMO routine update hyperlipidemia  Last Assessment & Plan:  History of hyperlipidemia on pravastatin with lipid profile performed by his PCP 03/17/2018 revealing total cholesterol 126, LDL 55 HDL 24. Formatting of this note might be different from the original. hyperlipidemia  Last Assessment & Plan:  Hi  . Hypertension   .  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 Kidney disease   . Large liver 12/09/2018  . Lower back pain 06/14/2015  . Lumbar facet joint pain 06/14/2015  . Lumbar foraminal stenosis 01/31/2019  . Lymphocytosis 05/24/2015  . Memory loss 03/15/2020  . Migraine    "had my 1st and only in 12/2014" (03/23/2015)  . Morbid obesity (HCC) 11/08/2020  . Neural foraminal stenosis of cervical spine 01/31/2019  . Neutrophilia 05/24/2015  . Non morbid obesity due to excess calories 02/12/2016  . Occipital neuralgia of right side 12/18/2016  . Occlusion and stenosis of unspecified carotid artery 04/07/2013   Formatting of this note might be different from the original. Overview:  Rt CEA Oct 2015-Dr [name]  Last Assessment & Plan:  History of carotid artery disease status post right carotid endarterectomy by Dr. [name] October 2015 which he follows by duplex ultrasound.  . Peripheral neuropathy    feet  . Peripheral vascular disease (HCC)    carotid artery disease  . Pneumonia 11/19,1/20  . PONV (postoperative nausea and vomiting) 2015   "was told I was confused and combative in recovery from the narcotics w/my carotid OR"  "During a colonoscopy my oxygen level dropped so low that I had to be woke up.- 03/2017 colonoscopy [facility]   . Sepsis (HCC) 05/08/2019  . Serum total bilirubin elevated 05/08/2019  . Sleep apnea    "severe; couldn't afford mask" (03/23/2015) uses O2 at night.no C-pap  . SOB (shortness of breath) 05/04/2019  . Spondylosis of lumbar spine 01/31/2019  . Stroke [facility]) 2014   "on walker for 2 months; left side is weaker and numb since" (03/23/2015), "2 confirmed strokes"  . Stroke (HCC) 05/10/2019   Aphasia mild  . Stroke, lacunar (HCC) 04/07/2013  . Transaminitis 05/08/2019  . Type 2 diabetes mellitus with diabetic neuropathy (HCC) 12/09/2018  . Type 2 diabetes mellitus with diabetic neuropathy, with long-term current use of insulin (HCC) 07/04/2014  . Type 2 diabetes mellitus, with long-term current use of insulin  (HCC) 02/12/2016  . Type II diabetes mellitus (HCC)     Past Surgical History:  Procedure Laterality Date  . BILIARY DILATION  08/11/2019   Procedure: BILIARY DILATION;  Surgeon: Meridee Score Netty Starring., MD;  Location: [facility] ENDOSCOPY;  Service: Gastroenterology;;  . BIOPSY  08/11/2019   Procedure: BIOPSY;  Surgeon: Lemar Lofty., MD;  Location: [facility] ENDOSCOPY;  Service: Gastroenterology;;  . CHOLECYSTECTOMY N/A 08/31/2019   Procedure: LAPAROSCOPIC CHOLECYSTECTOMY;  Surgeon: Gaynelle Adu, MD;  Location: [facility] ORS;  Service: General;  Laterality: N/A;  . CIRCUMCISION  1981  . COLONOSCOPY W/ POLYPECTOMY    . CORONARY ANGIOPLASTY WITH STENT PLACEMENT  03/23/2015   "2"  . CORONARY STENT INTERVENTION N/A 09/29/2017   Procedure: CORONARY STENT INTERVENTION;  Surgeon: Lorretta Harp, MD;  Location: [place] CV LAB;  Service: Cardiovascular;  Laterality: N/A;  . ENDARTERECTOMY Right 09/26/2014   Procedure: RIGHT CAROTID ENDARTERECTOMY WITH PATCH ANGIOPLASTY;  Surgeon: Conrad Fort Bliss, MD;  Location: [facility] OR;  Service: Vascular;  Laterality: Right;  . ENDOSCOPIC RETROGRADE CHOLANGIOPANCREATOGRAPHY (ERCP) WITH PROPOFOL N/A 08/11/2019   Procedure: ENDOSCOPIC RETROGRADE CHOLANGIOPANCREATOGRAPHY (ERCP) WITH PROPOFOL;  Surgeon: Irving Copas., MD;  Location: [place];  Service: Gastroenterology;  Laterality: N/A;  . ESOPHAGOGASTRODUODENOSCOPY (EGD) WITH PROPOFOL N/A 08/11/2019   Procedure: ESOPHAGOGASTRODUODENOSCOPY (EGD) WITH PROPOFOL;  Surgeon: Rush Landmark Telford Nab., MD;  Location: [place];  Service: Gastroenterology;  Laterality: N/A;  . EYE SURGERY Right May 2016   Cataract  . EYE SURGERY Left July 2016   Cataract  . HYDROCELE EXCISION  ~ 2008  . IR EXCHANGE BILIARY DRAIN  07/21/2019  . IR PERC CHOLECYSTOSTOMY  05/11/2019  . IR RADIOLOGIST EVAL & MGMT  06/30/2019  . LEFT HEART CATH AND CORONARY ANGIOGRAPHY N/A 09/29/2017   Procedure: LEFT HEART CATH AND CORONARY ANGIOGRAPHY;  Surgeon:  Lorretta Harp, MD;  Location: [place] CV LAB;  Service: Cardiovascular;  Laterality: N/A;  . LEFT HEART CATHETERIZATION WITH CORONARY ANGIOGRAM N/A 03/23/2015   Procedure: LEFT HEART CATHETERIZATION WITH CORONARY ANGIOGRAM;  Surgeon: Lorretta Harp, MD;  Location: [facility] CATH LAB;  Service: Cardiovascular;  Laterality: N/A;  . REFRACTIVE SURGERY Bilateral 08/2014-09/2014   Diabetic retinopathy   . REMOVAL OF STONES  08/11/2019   Procedure: REMOVAL OF STONES;  Surgeon: Rush Landmark Telford Nab., MD;  Location: [place];  Service: Gastroenterology;;  . Azzie Almas DILATION N/A 08/11/2019   Procedure: Azzie Almas DILATION;  Surgeon: Irving Copas., MD;  Location: [place];  Service: Gastroenterology;  Laterality: N/A;  . SPHINCTEROTOMY  08/11/2019   Procedure: SPHINCTEROTOMY;  Surgeon: Rush Landmark Telford Nab., MD;  Location: [place];  Service: Gastroenterology;;    Current Medications: Current Meds  Medication Sig  . ALPRAZolam (XANAX) 0.25 MG tablet Take 0.25 mg by mouth at bedtime as needed for anxiety.  Marland Kitchen aspirin 81 MG tablet Take 1 tablet (81 mg total) by mouth daily.  Marland Kitchen atorvastatin (LIPITOR) 80 MG tablet Take 80 mg by mouth daily.  . Budeson-Glycopyrrol-Formoterol (BREZTRI AEROSPHERE) 160-9-4.8 MCG/ACT AERO Inhale 1 puff into the lungs as needed.  . cholecalciferol (VITAMIN D3) 25 MCG (1000 UT) tablet Take 1,000 Units by mouth daily.  . clopidogrel (PLAVIX) 75 MG tablet Take 1 tablet (75 mg total) by mouth daily.  . diphenoxylate-atropine (LOMOTIL) 2.5-0.025 MG tablet TAKE ONE TABLET BY MOUTH TWICE DAILY AS NEEDED FOR DIARRHEA OR LOOSE STOOL  . Fluticasone-Umeclidin-Vilant (TRELEGY ELLIPTA) 100-62.5-25 MCG/INH AEPB Inhale 2 puffs into the lungs 2 (two) times daily.  Marland Kitchen gabapentin (NEURONTIN) 300 MG capsule Take 300 mg by mouth at bedtime.  . insulin NPH Human (NOVOLIN N) 100 UNIT/ML injection Inject 55 Units into the skin 2 (two) times daily.  . insulin regular (NOVOLIN R) 100  units/mL injection Inject 55 Units into the skin 2 (two) times daily. Pt is on a sliding scale  . ipratropium-albuterol (DUONEB) 0.5-2.5 (3) MG/3ML SOLN Inhale 3 mLs into the lungs 4 (four) times daily as needed for wheezing or shortness of breath.  . loperamide (IMODIUM  A-D) 2 MG tablet Imodium A-D 2 mg tablet  use 1 pill after each bowel moement up to 4 daily  . metoprolol succinate (TOPROL-XL) 50 MG 24 hr tablet Take 1 tablet (50 mg total) by mouth daily.  . montelukast (SINGULAIR) 10 MG tablet Take 10 mg by mouth at bedtime.  . nitroGLYCERIN (NITROSTAT) 0.4 MG SL tablet Place 1 tablet (0.4 mg total) under the tongue every 5 (five) minutes as needed for chest pain.  Marland Kitchen oxybutynin (DITROPAN) 5 MG tablet Take 5 mg by mouth at bedtime.   . pantoprazole (PROTONIX) 40 MG tablet Take 1 tablet by mouth once daily  . Polyvinyl Alcohol-Povidone (REFRESH OP) Place 1 drop into both eyes daily as needed (dry eyes).  . potassium chloride (KLOR-CON) 20 MEQ packet Take 20 mEq by mouth 3 (three) times daily.  . tamsulosin (FLOMAX) 0.4 MG CAPS capsule TAKE ONE CAPSULE BY MOUTH TWICE DAILY 30 MINUTES AFTER MEAL  . torsemide (DEMADEX) 20 MG tablet Take 2 tablets (40 mg total) by mouth 2 (two) times daily.     Allergies:   Metformin   Social History   Socioeconomic History  . Marital status: Married    Spouse name: Not on file  . Number of children: 5  . Years of education: 12th  . Highest education level: Not on file  Occupational History  . Occupation: Disabled  Tobacco Use  . Smoking status: Never Smoker  . Smokeless tobacco: Never Used  Vaping Use  . Vaping Use: Never used  Substance and Sexual Activity  . Alcohol use: No    Alcohol/week: 0.0 standard drinks  . Drug use: No  . Sexual activity: Not Currently    Partners: Female  Other Topics Concern  . Not on file  Social History Narrative   Patient is married with 5 children.    Patient is right handed.   Patient has hs education.    Patient drinks 4 12 oz can sodas daily.   Social Determinants of Health   Financial Resource Strain: Not on file  Food Insecurity: Not on file  Transportation Needs: Not on file  Physical Activity: Not on file  Stress: Not on file  Social Connections: Not on file     Family History: The patient's family history includes Asthma in his brother; Cancer in his father; Colon cancer in his brother and sister; Diabetes in his brother, daughter, maternal grandfather, and mother; Gout in his brother and maternal uncle; Heart attack in his father; Heart disease in his father and mother; Hyperlipidemia in his brother, mother, and sister; Hypertension in his brother, mother, and sister; Stroke in his paternal grandfather.  ROS:   Please see the history of present illness.    All other systems reviewed and are negative.  EKGs/Labs/Other Studies Reviewed:    The following studies were reviewed today: I discussed my findings with the patient in extensive length   Recent Labs: 08/04/2020: NT-Pro BNP 180 11/15/2020: BUN 35; Creatinine, Ser 1.36; Magnesium 2.2; Potassium 3.9; Sodium 146  Recent Lipid Panel    Component Value Date/Time   CHOL 146 08/27/2017 1039   TRIG 119 08/27/2017 1039   HDL 34 (L) 08/27/2017 1039   CHOLHDL 4.3 08/27/2017 1039   CHOLHDL 3.9 03/22/2015 1136   VLDL 44 (H) 03/22/2015 1136   LDLCALC 88 08/27/2017 1039    Physical Exam:    VS:  BP 120/82   Pulse (!) 101   Ht 5\' 6"  (1.676 m)   Wt  245 lb (111.1 kg)   SpO2 92%   BMI 39.54 kg/m     Wt Readings from Last 3 Encounters:  12/13/20 245 lb (111.1 kg)  11/08/20 240 lb (108.9 kg)  08/04/20 239 lb (108.4 kg)     GEN: Patient is in no acute distress HEENT: Normal NECK: No JVD; No carotid bruits LYMPHATICS: No lymphadenopathy CARDIAC: Hear sounds regular, 2/6 systolic murmur at the apex. RESPIRATORY:  Clear to auscultation without rales, wheezing or rhonchi  ABDOMEN: Soft, non-tender,  non-distended MUSCULOSKELETAL:  No edema; No deformity  SKIN: Warm and dry NEUROLOGIC:  Alert and oriented x 3 PSYCHIATRIC:  Normal affect   Signed, Garwin Brothers, MD  12/13/2020 3:43 PM    [facility]

## 2020-12-13 NOTE — Patient Instructions (Signed)

## 2021-01-03 ENCOUNTER — Ambulatory Visit: Payer: Medicare HMO | Admitting: Cardiology

## 2021-01-05 ENCOUNTER — Other Ambulatory Visit: Payer: Self-pay | Admitting: Gastroenterology

## 2021-01-22 ENCOUNTER — Other Ambulatory Visit: Payer: Self-pay | Admitting: Gastroenterology

## 2021-02-02 ENCOUNTER — Telehealth: Payer: Self-pay | Admitting: Cardiology

## 2021-02-02 DIAGNOSIS — E876 Hypokalemia: Secondary | ICD-10-CM

## 2021-02-02 NOTE — Telephone Encounter (Signed)
Called and spoke to patient wife per dpr. She reports the patient was seen [facility] on 02/01/[facility]. His potassium was 2.6. Verified this with [facility] record. Patient wife reports patient was given 2 bags of IV potassium and PO potassium as well. Spoke with Dr. Bing Matter about this since Dr. Dulce Sellar is off. He advised patient have labs rechecked on Monday in our office. Patient wife aware. No further questions.

## 2021-02-02 NOTE — Telephone Encounter (Signed)
    Pt's wife calling, she said pt was recently hospitalized at [facility], when they check pt's blood work they saw pt's potasium is dangerously low. She called pt's pcp and was advised to call Dr. Dulce Sellar for recommendations

## 2021-02-02 NOTE — Telephone Encounter (Signed)
Also confirmed with wife patient takes 20 meq potassium twice daily.

## 2021-02-06 ENCOUNTER — Telehealth: Payer: Self-pay | Admitting: Cardiology

## 2021-02-06 DIAGNOSIS — R350 Frequency of micturition: Secondary | ICD-10-CM | POA: Insufficient documentation

## 2021-02-06 LAB — BASIC METABOLIC PANEL
BUN/Creatinine Ratio: 20 (ref 10–24)
BUN: 29 mg/dL — ABNORMAL HIGH (ref 8–27)
CO2: 31 mmol/L — ABNORMAL HIGH (ref 20–29)
Calcium: 9.9 mg/dL (ref 8.6–10.2)
Chloride: 87 mmol/L — ABNORMAL LOW (ref 96–106)
Creatinine, Ser: 1.42 mg/dL — ABNORMAL HIGH (ref 0.76–1.27)
Glucose: 128 mg/dL — ABNORMAL HIGH (ref 65–99)
Potassium: 3.3 mmol/L — ABNORMAL LOW (ref 3.5–5.2)
Sodium: 143 mmol/L (ref 134–144)
eGFR: 56 mL/min/{1.73_m2} — ABNORMAL LOW (ref >59)

## 2021-02-06 NOTE — Telephone Encounter (Signed)
Pt wife called in and would like someone to call her about pt lab results   Best number [phone]

## 2021-02-07 ENCOUNTER — Telehealth: Payer: Self-pay

## 2021-02-07 ENCOUNTER — Encounter: Payer: Self-pay | Admitting: Gastroenterology

## 2021-02-07 ENCOUNTER — Ambulatory Visit: Payer: Medicare HMO | Admitting: Gastroenterology

## 2021-02-07 VITALS — HR 96 | Ht 65.0 in

## 2021-02-07 DIAGNOSIS — A0472 Enterocolitis due to Clostridium difficile, not specified as recurrent: Secondary | ICD-10-CM

## 2021-02-07 DIAGNOSIS — K8050 Calculus of bile duct without cholangitis or cholecystitis without obstruction: Secondary | ICD-10-CM | POA: Diagnosis not present

## 2021-02-07 DIAGNOSIS — K529 Noninfective gastroenteritis and colitis, unspecified: Secondary | ICD-10-CM

## 2021-02-07 DIAGNOSIS — Z800 Family history of malignant neoplasm of digestive organs: Secondary | ICD-10-CM | POA: Diagnosis not present

## 2021-02-07 DIAGNOSIS — K219 Gastro-esophageal reflux disease without esophagitis: Secondary | ICD-10-CM | POA: Diagnosis not present

## 2021-02-07 MED ORDER — DIPHENOXYLATE-ATROPINE 2.5-0.025 MG PO TABS: 1.0000 | ORAL_TABLET | Freq: Three times a day (TID) | ORAL | 2 refills | Status: DC | PRN

## 2021-02-07 MED ORDER — CHOLESTYRAMINE 4 G PO PACK: 4.0000 g | PACK | Freq: Every day | ORAL | 6 refills | Status: DC

## 2021-02-07 MED ORDER — POTASSIUM CHLORIDE CRYS ER 20 MEQ PO TBCR: 40.0000 meq | EXTENDED_RELEASE_TABLET | Freq: Two times a day (BID) | ORAL | 1 refills | Status: DC

## 2021-02-07 MED ORDER — ZENPEP 40000-126000 UNITS PO CPEP: ORAL_CAPSULE | ORAL | 3 refills | Status: DC

## 2021-02-07 MED ORDER — PANTOPRAZOLE SODIUM 40 MG PO TBEC: 40.0000 mg | DELAYED_RELEASE_TABLET | Freq: Every day | ORAL | 3 refills | Status: DC

## 2021-02-07 NOTE — Patient Instructions (Signed)
If you are age 62 or older, your body mass index should be between 23-30. Your Body mass index is 40.77 kg/m. If this is out of the aforementioned range listed, please consider follow up with your Primary Care Provider.  If you are age 55 or younger, your body mass index should be between 19-25. Your Body mass index is 40.77 kg/m. If this is out of the aformentioned range listed, please consider follow up with your Primary Care Provider.   We have sent the following medications to your pharmacy for you to pick up at your convenience: Protonix  Zenpep Questran  Lomotil   I will call regarding lab work and stool specimen from [facility].  Thank you,  Dr. Lynann Bologna

## 2021-02-07 NOTE — Telephone Encounter (Signed)
Spoke with the patient wife, she stated the patient is on Potassium Chloride 1 tab bid. I advised the following per Dr. Dulce Sellar to increase to 2 tabs bid. I sent a refill with new instructions. Darl Pikes asked for how long will the patient be under the new directions of use. I sent a message to Dr. Dulce Sellar for clarifications.

## 2021-02-07 NOTE — Progress Notes (Signed)
IMPRESSION and PLAN:    #1.Chronic diarrhea - likely d/t IBS-D, meds, EPI (low fecal elastase in past), post-chole diarrhea, diabetic diarrhea, meds. R/O infections. Neg colon 2015 and 2018 (except for small tubular adenoma), neg TI bx for crohn's and neg random colon Bx for microscopic colitis. CT scan A/P showing ileitis.  No previous H/O Crohn's disease.   #2. Choledocholithiasis s/p ERCP with ES and stone extraction 08/11/2019. S/O lap chole 08/31/2019.  #3. GERD with dysphagia (dysphagia has resolved after dilatation 08/11/2019).  #3. H/O C. Diff colitis (off vancomycin since 07/18/2019). Neg C. Diff 08/2019   #5.  Multiple co-morbid conditions including CAD s/p PTCA, DM2, HTN, OSA, CVA 2016, now wheelchair-bound.  #6. FH of colon cancer.      Plan: -Stool for c diff, GI pathogen, calprotectin -Continue Lomotil 3/day prn (60), 6 refills. -Continue protonix 73m po qd. -trial of cholestryamine 4g po QD, 2hr before or after rest of meds -Zenpep (Lip 40K) 2 with each meal (samples given) -Call in 2 weeks -Please obtain blood tests from Dr DGarlon Hatchet-He is not in any shape to get any endoscopic procedures performed currently, especially colonoscopy.  He does not want to come into the hospital yet.  He continues to have diarrhea and the above work-up is neg, would consider flexible sigmoidoscopy with biopsies. -Discussed in detail with the patient and patient's sister. HPI:    Chief Complaint:   89year old very pleasant white [facility] for acute cholecystitis/pancreatitis with SIRS d/t Klebsiella sepsis/ARF req cholecystostomy (CT did not show any evidence of pancreatitis, however adm lipase was 1276).  Has done very well since discharge.  Unfortunately had increased diarrhea and was tested positive for C. Difficile.  Treated with vancomycin 125 mg QID for 10 days (last dose 07/18/2019).  His general condition has deteriorated.  He is currently on wheelchair.  He is also  being considered for placement at a rehab center.  He is in no shape to get any endoscopic procedures performed.  [facility].  We do not have any records.  Stool studies were not done.  He was seen by Dr. MBettina Gaviaand was seen by Dr. DGarlon Hatchetthis AM.  He had blood tests which are pending.  In the ED he was told he has low potassium.  He was given IV.  Dr. MBettina Gaviadid blood tests and his potassium was still low at 3.3.  He was given p.o. potassium.  Has diarrhea the frequency of 1-5 BMs per day, mostly after eating, no nocturnal symptoms.  He denies having any abdominal pain.  There is no fever or chills.  He has not been on any antibiotics recently.  Lomotil helps with the diarrhea.    From previous records: Cholecystostomy cholangiogram 06/30/2019-showed small cystic duct stone, small CBD stone without any significant ductal dilatation. It does show cholelithiasis as well.  Underwent ERCP with biliary sphincterotomy and stone extraction on 08/11/2019.  Also had EGD with dilatation prior to ERCP.  Esophagus was dilated to 17 mm savory.  This has resulted in complete resolution of dysphagia.  His heartburn is under good control with Protonix.  Underwent laparoscopic cholecystectomy on 08/31/2019.   No jaundice dark urine or pale stools.  He denies having any fever or chills.   CT AP 11/2020 1. Mild mucosal enhancement with borderline wall thickening of the distal and terminal ileum, suggesting enteritis. This may be infectious  or inflammatory including Crohn's disease. 2. Fluid/liquid stool in the cecum, ascending, and proximal transverse colon, can be seen with diarrheal illness or rapid transit. No bowel obstruction. Normal appendix. 3. Hepatic steatosis.  Past Medical History:  Diagnosis Date  . Abdominal distention   . Abnormal CT scan 12/09/2018  . Abnormal nuclear cardiac imaging test    High-risk nuclear stress test   . Acute  febrile illness 05/08/2019  . Acute pancreatitis 05/08/2019  . Adjustment disorder with mixed anxiety and depressed mood 07/27/2020  . AKI (acute kidney injury) ([place]) 03/26/2020  . Arthritis   . Arthropathy of cervical facet joint 01/31/2019  . Bicuspid aortic valve 03/27/2020   Formatting of this note might be different from the original. 2020: ECHO  . Bilateral low back pain without sciatica 09/13/2015  . Bilateral lower extremity edema 08/18/2018   Bilateral lower extremity edema  Formatting of this note might be different from the original. Bilateral lower extremity edema  Last Assessment & Plan:  He does have 1-2+ pitting edema.  I am going to increase his Lasix to 80 mg a day, and will check a basic metabolic panel in 7 to 10 days.  He will see Cyril Mourning back in 1 month for follow-up of lab work.  Marland Kitchen BPH with obstruction/lower urinary tract symptoms 02/20/2016  . Bradycardia 04/07/2020  . CAD -S/P PCI-2016 11/30/2015   LAD DES 2016. Admitted to [facility] Sept 2017 with SOB, not felt to be in CHF, Myoview was negative for sichemia  Formatting of this note might be different from the original. Overview:  LAD DES 2016. Admitted to [facility] Sept 2017 with SOB, not felt to be in CHF, Myoview was negative for sichemia  Last Assessment & Plan:  History of CAD status post LAD stenting by myself in 2016 with re-intervention because o  . Carotid artery stenosis- 09/26/2014   Rt CEA Oct 2015-Dr Chen  . Carotid stenosis 04/07/2013   Rt CEA Oct 2015-Dr Chen  Last Assessment & Plan:  History of carotid artery disease status post right carotid endarterectomy by Dr. Bridgett Larsson October 2015 which he follows by duplex ultrasound. Formatting of this note might be different from the original. Rt CEA Oct 2015-Dr Bridgett Larsson  Last Assessment & Plan:  History of carotid artery disease status post right carotid endarterectomy by Dr. Bridgett Larsson October 2015  . Carpal tunnel syndrome 07/17/2015  . Cerebrovascular accident (CVA) due to thrombosis of basilar artery  ([place]) 09/13/2015  . Cerebrovascular accident (CVA) due to thrombosis of right middle cerebral artery ([place]) 07/04/2014   Questionable small stroke in 2014 with transient Lt sided weakness in setting of carotid disease.  Formatting of this note might be different from the original. Overview:  Questionable small stroke in 2014 with transient Lt sided weakness in setting of carotid disease.  Last Assessment & Plan:  History of multiple strokes in the past dating back to 2014 with a recent stroke in April of this year a  . Cervical spine disease 04/07/2013  . Cervical spondylosis without myelopathy 01/31/2019  . Cholecystitis 06/24/2019  . Cholelithiases 12/09/2018  . Chronic diarrhea 12/09/2018  . Chronic hypoxemic respiratory failure ([place]) 06/19/2020  . Chronic neck and back pain   . Chronic pain disorder 01/31/2019  . Clostridium difficile diarrhea    negative culture 08/17/19  . Community acquired pneumonia 06/19/2020   Formatting of this note might be different from the original. 2021: hosp  . Coronary artery disease    drug-eluting stents placed in  proximal and mid LAD  . Coronary artery disease with history of myocardial infarction without history of CABG 04/07/2013  . DDD (degenerative disc disease), cervical 01/31/2019  . Degenerative joint disease of thoracic spine 12/09/2018  . Deviated septum   . Diabetes mellitus type 2, uncontrolled ([place]) 04/07/2013  . Diabetic polyneuropathy associated with type 2 diabetes mellitus ([place]) 01/31/2019  . Diabetic retinopathy ([place])   . Dyslipidemia 12/09/2018  . Dyspnea on exertion 03/26/2020   Dyspnea on exertion  Formatting of this note might be different from the original. Overview:  Dyspnea on exertion  Last Assessment & Plan:  Improved after stenting of his LAD  . Elevated troponin 12/09/2018  . Enteritis 11/30/2020   Formatting of this note might be different from the original. 11/30/2020:  . Erectile dysfunction 11/25/2015  . Essential hypertension 04/07/2013    hypertension  Formatting of this note might be different from the original. hypertension  Last Assessment & Plan:  History of essential hypertension her blood pressure measured at 162/70.  He is on benazepril 10 the morning and 20 in the evening as well as hydralazine, hydrochlorothiazide, and metoprolol.  Blood pressures have been on the high side.  I am going to increase his benazepril to 20 mg   . GERD (gastroesophageal reflux disease)    "occasionally" (03/23/2015)  . Headache    "more than 2/wk" (03/23/2015)  . Heart attack ([place])    "in 1997 was told I had signs of a small heart attack that I didn't know I'd had"  . High cholesterol   . History of cardioembolic cerebrovascular accident (CVA) 12/09/2018  . History of gout    "when I was younger"  . History of kidney stones    passed  . History of stroke 04/20/2018  . Hyperlipidemia, unspecified 04/07/2013   hyperlipidemia  Formatting of this note might be different from the original. IMO routine update IMO routine update hyperlipidemia  Last Assessment & Plan:  History of hyperlipidemia on pravastatin with lipid profile performed by his PCP 03/17/2018 revealing total cholesterol 126, LDL 55 HDL 24. Formatting of this note might be different from the original. hyperlipidemia  Last Assessment & Plan:  Hi  . Hypertension   . 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 Kidney disease   . Large liver 12/09/2018  . Lower back pain 06/14/2015  . Lumbar facet joint pain 06/14/2015  . Lumbar foraminal stenosis 01/31/2019  . Lymphocytosis 05/24/2015  . Memory loss 03/15/2020  . Migraine    "had my 1st and only in 12/2014" (03/23/2015)  . Morbid obesity ([place]) 11/08/2020  . Neural foraminal stenosis of cervical spine 01/31/2019  . Neutrophilia 05/24/2015  . Non morbid obesity due to excess calories 02/12/2016  . Occipital neuralgia of right side 12/18/2016  . Occlusion and stenosis of unspecified carotid artery 04/07/2013   Formatting of this note might be different from the  original. Overview:  Rt CEA Oct 2015-Dr Bridgett Larsson  Last Assessment & Plan:  History of carotid artery disease status post right carotid endarterectomy by Dr. Bridgett Larsson October 2015 which he follows by duplex ultrasound.  . Peripheral neuropathy    feet  . Peripheral vascular disease ([place])    carotid artery disease  . Pneumonia 11/19,1/20  . PONV (postoperative nausea and vomiting) 2015   "was told I was confused and combative in recovery from the narcotics w/my carotid OR"  "During a colonoscopy my oxygen level dropped so low that I had to be woke up.- 03/2017 colonoscopy [facility]   . Sepsis ([place]) 05/08/2019  . Serum total bilirubin elevated 05/08/2019  . Sleep apnea    "severe; couldn't afford mask" (03/23/2015) uses O2 at night.no C-pap  . SOB (shortness of breath) 05/04/2019  . Spondylosis of lumbar spine 01/31/2019  . Stroke [facility]) 2014   "on walker for 2 months; left side is weaker and numb since" (03/23/2015), "2 confirmed strokes"  . Stroke ([place]) 05/10/2019   Aphasia mild  . Stroke, lacunar ([place]) 04/07/2013  . Transaminitis 05/08/2019  . Type 2 diabetes mellitus with diabetic neuropathy ([place]) 12/09/2018  . Type 2 diabetes mellitus with diabetic neuropathy, with long-term current use of insulin ([place]) 07/04/2014  . Type 2 diabetes mellitus, with long-term current use of insulin ([place]) 02/12/2016  . Type II diabetes mellitus ([place])     Current Outpatient Medications  Medication Sig Dispense Refill  . ALPRAZolam (XANAX) 0.25 MG tablet Take 0.25 mg by mouth at bedtime as needed for anxiety.    Marland Kitchen aspirin 81 MG tablet Take 1 tablet (81 mg total) by mouth daily. 1 tablet 0  . atorvastatin (LIPITOR) 80 MG tablet Take 80 mg by mouth daily.    . Budeson-Glycopyrrol-Formoterol (BREZTRI AEROSPHERE) 160-9-4.8 MCG/ACT AERO Inhale 1 puff into the lungs as needed.    . cholecalciferol (VITAMIN D3) 25 MCG (1000 UT) tablet Take 1,000 Units by mouth daily.    . clopidogrel (PLAVIX) 75 MG tablet Take 1 tablet (75  mg total) by mouth daily. 90 tablet 3  . diphenoxylate-atropine (LOMOTIL) 2.5-0.025 MG tablet TAKE ONE TABLET BY MOUTH TWICE DAILY AS NEEDED DIARRHEA or loose stool 60 tablet 0  . Fluticasone-Umeclidin-Vilant (TRELEGY ELLIPTA) 100-62.5-25 MCG/INH AEPB Inhale 2 puffs into the lungs 2 (two) times daily.    Marland Kitchen gabapentin (NEURONTIN) 300 MG capsule Take 300 mg by mouth at bedtime.    . insulin NPH Human (NOVOLIN N) 100 UNIT/ML injection Inject 55 Units into the skin 2 (two) times daily.    . insulin regular (NOVOLIN R) 100 units/mL injection Inject 55 Units into the skin 2 (two) times daily. Pt is on a sliding scale    . ipratropium-albuterol (DUONEB) 0.5-2.5 (3) MG/3ML SOLN Inhale 3 mLs into the lungs 4 (four) times daily as needed for wheezing or shortness of breath.    . levofloxacin (LEVAQUIN) 250 MG tablet Take 250 mg by mouth daily.    Marland Kitchen loperamide (IMODIUM A-D) 2 MG tablet Imodium A-D 2 mg tablet  use 1 pill after each bowel moement up to 4 daily    . metoprolol succinate (TOPROL-XL) 50 MG 24 hr tablet Take 1 tablet (50 mg total) by mouth daily. 90 tablet 3  . montelukast (SINGULAIR) 10 MG tablet Take 10 mg by mouth at bedtime.    . nitroGLYCERIN (NITROSTAT) 0.4 MG SL tablet Place 1 tablet (0.4 mg total) under the tongue every 5 (five) minutes as needed for chest pain. 25 tablet 3  . oxybutynin (DITROPAN) 5 MG tablet Take 5 mg by mouth at bedtime.     . pantoprazole (PROTONIX) 40 MG tablet Take 1 tablet by mouth once daily 90 tablet 0  . Polyvinyl Alcohol-Povidone (REFRESH OP) Place 1 drop into both eyes daily as needed (dry eyes).    . potassium chloride (KLOR-CON) 20 MEQ packet Take 20 mEq by mouth 3 (three) times daily.    . tamsulosin (FLOMAX) 0.4 MG CAPS capsule TAKE ONE CAPSULE BY MOUTH TWICE DAILY 30 MINUTES AFTER MEAL    . torsemide (DEMADEX) 20  MG tablet Take 2 tablets (40 mg total) by mouth 2 (two) times daily. 360 tablet 2  . metolazone (ZAROXOLYN) 2.5 MG tablet Take 1 tablet (2.5 mg  total) by mouth 2 (two) times a week. 24 tablet 3   No current facility-administered medications for this visit.    Patient's surgical history, family medical history, social history and allergies were all reviewed in Epic    Physical Exam:     Pulse 96   Ht _0  (1.651 m)   BMI 40.77 kg/m   GENERAL:  Alert, oriented, cooperative, not in acute distress. PSYCH: :Pleasant, normal mood and affect. HEENT:  conjunctiva pink, mucous membranes moist, neck supple without masses. No jaundice. CARDIAC:  S1 S2 normal. No murmers. PULM: Normal respiratory effort, lungs CTA bilaterally, no wheezing. ABDOMEN: Inspection: No visible peristalsis, no abnormal pulsations, skin normal.  Palpation/percussion: Soft, nontender, nondistended, no rigidity, no abnormal dullness to percussion, no hepatosplenomegaly and no palpable abdominal masses.  Multiple incisions with bandage.  No pus. auscultation: Normal bowel sounds, no abdominal bruits. Rectal exam: Deferred SKIN:  turgor, no lesions seen. Musculoskeletal:  Normal muscle tone, normal strength. NEURO: Alert and oriented x 3, some left-sided weakness Hepatic Function Latest Ref Rng & Units 08/27/2019 08/13/2019 08/09/2019  Total Protein 6.5 - 8.1 g/dL 7.4 7.0 7.5  Albumin 3.5 - 5.0 g/dL 3.9 - 4.2  AST 15 - 41 U/L 35 47(H) 23  ALT 0 - 44 U/L 48(H) 43 30  Alk Phosphatase 38 - 126 U/L 86 - 79  Total Bilirubin 0.3 - 1.2 mg/dL 0.5 0.5 0.5  Bilirubin, Direct 0.00 - 0.40 mg/dL - - -   CBC Latest Ref Rng & Units 10/19/2019 08/27/2019 08/13/2019  WBC 4.0 - 10.5 K/uL 12.7(H) 11.4(H) 10.9(H)  Hemoglobin 13.0 - 17.0 g/dL 11.4(L) 12.3(L) 12.6(L)  Hematocrit 39.0 - 52.0 % 37.1(L) 39.6 38.5(L)  Platelets 150 - 400 K/uL 305 270 313.0   Lipase     Component Value Date/Time   LIPASE 8.0 (L) 07/22/2019 1103       Jennilee Demarco,MD 02/07/2021, 3:09 PM

## 2021-02-07 NOTE — Telephone Encounter (Signed)
-----   Message from Baldo Daub, MD sent at 02/07/2021  4:28 PM EST ----- His med list that he was taken 20 mEq 3 times daily  Is this correct  If yes then I would like him to increase to 2 of the 20s twice daily. ----- Message ----- From: Hazle Quant, RN Sent: 02/07/2021   4:21 PM EST To: Baldo Daub, MD  Is this in addition to his normal 2 tablets that he takes daily of 20 meq daily? And just a one time dose of the extra too?  ----- Message ----- From: Baldo Daub, MD Sent: 02/07/2021   4:12 PM EST To: [facility]p Triage  BMP is stable potassium remains mildly depleted lets have him take 2 potassium 20 mEq tablets

## 2021-02-07 NOTE — Telephone Encounter (Signed)
Spoke with pt's wife ok per DPR advised that Dr. Dulce Sellar has not reviewed yet. Copy of labs sent via Epic to Dr. Sol Passer and Dr. Chales Abrahams for appts today.

## 2021-02-08 ENCOUNTER — Telehealth: Payer: Self-pay | Admitting: Gastroenterology

## 2021-02-08 NOTE — Telephone Encounter (Signed)
Inbound call from patient's wife requesting a call back please.  States he received samples of Zenpep medication but they're not sure how he is suppose to take them.  Please advise.

## 2021-02-08 NOTE — Telephone Encounter (Signed)
Spoke with wife and she knows to pick up stool specimen from [facility]. Zenpep is 2 tablets with each meal and they will wlt [place] know if it is working. Per pharmacy looks like Zenpep is 190 for 200 tablets but I only sent in 120. Protonix cant be filled until March 30th since they had it filled about 2/14. She will try good rx for the zenpep.

## 2021-02-26 DIAGNOSIS — M1990 Unspecified osteoarthritis, unspecified site: Secondary | ICD-10-CM | POA: Insufficient documentation

## 2021-03-03 ENCOUNTER — Other Ambulatory Visit: Payer: Self-pay | Admitting: Cardiology

## 2021-03-05 NOTE — Telephone Encounter (Signed)
Refill sent to pharmacy.   

## 2021-03-08 ENCOUNTER — Telehealth: Payer: Self-pay | Admitting: Cardiology

## 2021-03-08 NOTE — Telephone Encounter (Signed)
Pt wife called and stated that pt heath has extremely decline and she is not able to get him in the office without help .  She only has help before 1 everyday .  There was nothing available this month with Dr Dulce Sellar before 1.  She state she is not sure what she needs to do?      Best number [phone]

## 2021-03-08 NOTE — Telephone Encounter (Signed)
I don't see anything for a while. How would you advise?

## 2021-03-09 NOTE — Telephone Encounter (Signed)
Left message on patients voicemail to please return our call.   

## 2021-03-09 NOTE — Telephone Encounter (Signed)
Spoke to the patient and the patients wife just now and let her know that she could just bring the patient in on the 6th in the morning. She verbalizes understanding and thanks me for calling her back.

## 2021-03-13 NOTE — Progress Notes (Deleted)
Cardiology Office Note:    Date:  03/13/2021   ID:  Johnathan Keith, DOB 11-Apr-1961, MRN [id number]  PCP:  Olive Bass, MD  Cardiologist:  Norman Herrlich, MD    Referring MD: Olive Bass, MD    ASSESSMENT:    No diagnosis found. PLAN:    In order of problems listed above:  1. ***   Next appointment: ***   Medication Adjustments/Labs and Tests Ordered: Current medicines are reviewed at length with the patient today.  Concerns regarding medicines are outlined above.  No orders of the defined types were placed in this encounter.  No orders of the defined types were placed in this encounter.   No chief complaint on file.   History of Present Illness:    Johnathan Keith is a 60 y.o. male with a hx of heart failure combined systolic and diastolic chronic CAD hypertension vascular disease at right carotid endarterectomy hyperlipidemia diabetes last seen 08/04/2020 with compensated heart failure and blood pressure at target. Coronary angiography was performed 09/29/2017 showed an 80% mid LAD stenosis with PCI and previous stent drug-eluting stent 2016 proximal mid LAD patent.   Compliance with diet, lifestyle and medications: *** Past Medical History:  Diagnosis Date  . Abdominal distention   . Abnormal CT scan 12/09/2018  . Abnormal nuclear cardiac imaging test    High-risk nuclear stress test   . Acute febrile illness 05/08/2019  . Acute pancreatitis 05/08/2019  . Adjustment disorder with mixed anxiety and depressed mood 07/27/2020  . AKI (acute kidney injury) (HCC) 03/26/2020  . Arthritis   . Arthropathy of cervical facet joint 01/31/2019  . Bicuspid aortic valve 03/27/2020   Formatting of this note might be different from the original. 2020: ECHO  . Bilateral low back pain without sciatica 09/13/2015  . Bilateral lower extremity edema 08/18/2018   Bilateral lower extremity edema  Formatting of this note might be different from the original. Bilateral lower extremity edema   Last Assessment & Plan:  He does have 1-2+ pitting edema.  I am going to increase his Lasix to 80 mg a day, and will check a basic metabolic panel in 7 to 10 days.  He will see Baxter Hire back in 1 month for follow-up of lab work.  Marland Kitchen BPH with obstruction/lower urinary tract symptoms 02/20/2016  . Bradycardia 04/07/2020  . CAD -S/P PCI-2016 11/30/2015   LAD DES 2016. Admitted to [facility] Sept 2017 with SOB, not felt to be in CHF, Myoview was negative for sichemia  Formatting of this note might be different from the original. Overview:  LAD DES 2016. Admitted to [facility] Sept 2017 with SOB, not felt to be in CHF, Myoview was negative for sichemia  Last Assessment & Plan:  History of CAD status post LAD stenting by myself in 2016 with re-intervention because o  . Carotid artery stenosis- 09/26/2014   Rt CEA Oct 2015-Dr Chen  . Carotid stenosis 04/07/2013   Rt CEA Oct 2015-Dr Chen  Last Assessment & Plan:  History of carotid artery disease status post right carotid endarterectomy by Dr. [name] October 2015 which he follows by duplex ultrasound. Formatting of this note might be different from the original. Rt CEA Oct 2015-Dr [name]  Last Assessment & Plan:  History of carotid artery disease status post right carotid endarterectomy by Dr. [name] October 2015  . Carpal tunnel syndrome 07/17/2015  . 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 Cerebrovascular accident (CVA) due to thrombosis  of right middle cerebral artery (HCC) 07/04/2014   Questionable small stroke in 2014 with transient Lt sided weakness in setting of carotid disease.  Formatting of this note might be different from the original. Overview:  Questionable small stroke in 2014 with transient Lt sided weakness in setting of carotid disease.  Last Assessment & Plan:  History of multiple strokes in the past dating back to 2014 with a recent stroke in April of this year a  . Cervical spine disease 04/07/2013  . Cervical spondylosis  without myelopathy 01/31/2019  . Cholecystitis 06/24/2019  . Cholelithiases 12/09/2018  . Chronic diarrhea 12/09/2018  . Chronic hypoxemic respiratory failure (HCC) 06/19/2020  . Chronic neck and back pain   . Chronic pain disorder 01/31/2019  . Clostridium difficile diarrhea    negative culture 08/17/19  . Community acquired pneumonia 06/19/2020   Formatting of this note might be different from the original. 2021: hosp  . Coronary artery disease    drug-eluting stents placed in proximal and mid LAD  . Coronary artery disease with history of myocardial infarction without history of CABG 04/07/2013  . DDD (degenerative disc disease), cervical 01/31/2019  . Degenerative joint disease of thoracic spine 12/09/2018  . Deviated septum   . Diabetes mellitus type 2, uncontrolled (HCC) 04/07/2013  . Diabetic polyneuropathy associated with type 2 diabetes mellitus (HCC) 01/31/2019  . Diabetic retinopathy (HCC)   . Dyslipidemia 12/09/2018  . Dyspnea on exertion 03/26/2020   Dyspnea on exertion  Formatting of this note might be different from the original. Overview:  Dyspnea on exertion  Last Assessment & Plan:  Improved after stenting of his LAD  . Elevated troponin 12/09/2018  . Enteritis 11/30/2020   Formatting of this note might be different from the original. 11/30/2020:  . Erectile dysfunction 11/25/2015  . Essential hypertension 04/07/2013   hypertension  Formatting of this note might be different from the original. hypertension  Last Assessment & Plan:  History of essential hypertension her blood pressure measured at 162/70.  He is on benazepril 10 the morning and 20 in the evening as well as hydralazine, hydrochlorothiazide, and metoprolol.  Blood pressures have been on the high side.  I am going to increase his benazepril to 20 mg   . GERD (gastroesophageal reflux disease)    "occasionally" (03/23/2015)  . Headache    "more than 2/wk" (03/23/2015)  . Heart attack (HCC)    "in 1997 was told I had signs of a  small heart attack that I didn't know I'd had"  . High cholesterol   . History of cardioembolic cerebrovascular accident (CVA) 12/09/2018  . History of gout    "when I was younger"  . History of kidney stones    passed  . History of stroke 04/20/2018  . Hyperlipidemia, unspecified 04/07/2013   hyperlipidemia  Formatting of this note might be different from the original. IMO routine update IMO routine update hyperlipidemia  Last Assessment & Plan:  History of hyperlipidemia on pravastatin with lipid profile performed by his PCP 03/17/2018 revealing total cholesterol 126, LDL 55 HDL 24. Formatting of this note might be different from the original. hyperlipidemia  Last Assessment & Plan:  Hi  . Hypertension   . 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 Kidney disease   . Large liver 12/09/2018  . Lower back pain 06/14/2015  . Lumbar facet joint pain 06/14/2015  . Lumbar foraminal stenosis 01/31/2019  . Lymphocytosis 05/24/2015  . Memory loss 03/15/2020  . Migraine    "  had my 1st and only in 12/2014" (03/23/2015)  . Morbid obesity (HCC) 11/08/2020  . Neural foraminal stenosis of cervical spine 01/31/2019  . Neutrophilia 05/24/2015  . Non morbid obesity due to excess calories 02/12/2016  . Occipital neuralgia of right side 12/18/2016  . Occlusion and stenosis of unspecified carotid artery 04/07/2013   Formatting of this note might be different from the original. Overview:  Rt CEA Oct 2015-Dr [name]  Last Assessment & Plan:  History of carotid artery disease status post right carotid endarterectomy by Dr. [name] October 2015 which he follows by duplex ultrasound.  . Peripheral neuropathy    feet  . Peripheral vascular disease (HCC)    carotid artery disease  . Pneumonia 11/19,1/20  . PONV (postoperative nausea and vomiting) 2015   "was told I was confused and combative in recovery from the narcotics w/my carotid OR"  "During a colonoscopy my oxygen level dropped so low that I had to be woke up.- 03/2017 colonoscopy [facility]   . Sepsis (HCC) 05/08/2019  . Serum total bilirubin elevated 05/08/2019  . Sleep apnea    "severe; couldn't afford mask" (03/23/2015) uses O2 at night.no C-pap  . SOB (shortness of breath) 05/04/2019  . Spondylosis of lumbar spine 01/31/2019  . Stroke [facility]) 2014   "on walker for 2 months; left side is weaker and numb since" (03/23/2015), "2 confirmed strokes"  . Stroke (HCC) 05/10/2019   Aphasia mild  . Stroke, lacunar (HCC) 04/07/2013  . Transaminitis 05/08/2019  . Type 2 diabetes mellitus with diabetic neuropathy (HCC) 12/09/2018  . Type 2 diabetes mellitus with diabetic neuropathy, with long-term current use of insulin (HCC) 07/04/2014  . Type 2 diabetes mellitus, with long-term current use of insulin (HCC) 02/12/2016  . Type II diabetes mellitus (HCC)     Past Surgical History:  Procedure Laterality Date  . BILIARY DILATION  08/11/2019   Procedure: BILIARY DILATION;  Surgeon: Meridee Score Netty Starring., MD;  Location: [facility] ENDOSCOPY;  Service: Gastroenterology;;  . BIOPSY  08/11/2019   Procedure: BIOPSY;  Surgeon: Lemar Lofty., MD;  Location: [facility] ENDOSCOPY;  Service: Gastroenterology;;  . CHOLECYSTECTOMY N/A 08/31/2019   Procedure: LAPAROSCOPIC CHOLECYSTECTOMY;  Surgeon: Gaynelle Adu, MD;  Location: [facility] ORS;  Service: General;  Laterality: N/A;  . CIRCUMCISION  1981  . COLONOSCOPY W/ POLYPECTOMY    . CORONARY ANGIOPLASTY WITH STENT PLACEMENT  03/23/2015   "2"  . CORONARY STENT INTERVENTION N/A 09/29/2017   Procedure: CORONARY STENT INTERVENTION;  Surgeon: Runell Gess, MD;  Location: [facility] INVASIVE CV LAB;  Service: Cardiovascular;  Laterality: N/A;  . ENDARTERECTOMY Right 09/26/2014   Procedure: RIGHT CAROTID ENDARTERECTOMY WITH PATCH ANGIOPLASTY;  Surgeon: Fransisco Hertz, MD;  Location[facility] OR;  Service: Vascular;  Laterality: Right;  . ENDOSCOPIC RETROGRADE CHOLANGIOPANCREATOGRAPHY (ERCP) WITH PROPOFOL N/A 08/11/2019   Procedure: ENDOSCOPIC RETROGRADE CHOLANGIOPANCREATOGRAPHY  (ERCP) WITH PROPOFOL;  Surgeon: Lemar Lofty., MD;  Location: [facility] ENDOSCOPY;  Service: Gastroenterology;  Laterality: N/A;  . ESOPHAGOGASTRODUODENOSCOPY (EGD) WITH PROPOFOL N/A 08/11/2019   Procedure: ESOPHAGOGASTRODUODENOSCOPY (EGD) WITH PROPOFOL;  Surgeon: Meridee Score Netty Starring., MD;  Location: [facility] ENDOSCOPY;  Service: Gastroenterology;  Laterality: N/A;  . EYE SURGERY Right May 2016   Cataract  . EYE SURGERY Left July 2016   Cataract  . HYDROCELE EXCISION  ~ 2008  . IR EXCHANGE BILIARY DRAIN  07/21/2019  . IR PERC CHOLECYSTOSTOMY  05/11/2019  . IR RADIOLOGIST EVAL & MGMT  06/30/2019  . LEFT HEART CATH AND CORONARY ANGIOGRAPHY  N/A 09/29/2017   Procedure: LEFT HEART CATH AND CORONARY ANGIOGRAPHY;  Surgeon: Runell GessBerry, Jonathan J, MD;  Location: [facility] INVASIVE CV LAB;  Service: Cardiovascular;  Laterality: N/A;  . LEFT HEART CATHETERIZATION WITH CORONARY ANGIOGRAM N/A 03/23/2015   Procedure: LEFT HEART CATHETERIZATION WITH CORONARY ANGIOGRAM;  Surgeon: Runell GessJonathan J Berry, MD;  [facility];  Service: Cardiovascular;  Laterality: N/A;  . REFRACTIVE SURGERY Bilateral 08/2014-09/2014   Diabetic retinopathy   . REMOVAL OF STONES  08/11/2019   Procedure: REMOVAL OF STONES;  Surgeon: Meridee ScoreMansouraty, Netty StarringGabriel Jr., MD;  [facility];  Service: Gastroenterology;;  . Gaspar BiddingSAVORY DILATION N/A 08/11/2019   Procedure: Gaspar BiddingSAVORY DILATION;  Surgeon: Lemar LoftyMansouraty, Gabriel Jr., MD;  [facility];  Service: Gastroenterology;  Laterality: N/A;  . SPHINCTEROTOMY  08/11/2019   Procedure: SPHINCTEROTOMY;  Surgeon: Meridee ScoreMansouraty, Netty StarringGabriel Jr., MD;  [facility];  Service: Gastroenterology;;    Current Medications: No outpatient medications have been marked as taking for the 03/14/21 encounter (Appointment) with Baldo DaubMunley, Grisell Bissette J, MD.     Allergies:   Metformin   Social History   Socioeconomic History  . Marital status: Married    Spouse name: Not on file  . Number of children: 5  . Years of education:  12th  . Highest education level: Not on file  Occupational History  . Occupation: Disabled  Tobacco Use  . Smoking status: Never Smoker  . Smokeless tobacco: Never Used  Vaping Use  . Vaping Use: Never used  Substance and Sexual Activity  . Alcohol use: No    Alcohol/week: 0.0 standard drinks  . Drug use: No  . Sexual activity: Not Currently    Partners: Female  Other Topics Concern  . Not on file  Social History Narrative   Patient is married with 5 children.    Patient is right handed.   Patient has hs education.   Patient drinks 4 12 oz can sodas daily.   Social Determinants of Health   Financial Resource Strain: Not on file  Food Insecurity: Not on file  Transportation Needs: Not on file  Physical Activity: Not on file  Stress: Not on file  Social Connections: Not on file     Family History: The patient's ***family history includes Asthma in his brother; Cancer in his father; Colon cancer in his brother and sister; Diabetes in his brother, daughter, maternal grandfather, and mother; Gout in his brother and maternal uncle; Heart attack in his father; Heart disease in his father and mother; Hyperlipidemia in his brother, mother, and sister; Hypertension in his brother, mother, and sister; Stroke in his paternal grandfather. ROS:   Please see the history of present illness.    All other systems reviewed and are negative.  EKGs/Labs/Other Studies Reviewed:    The following studies were reviewed today:  EKG:  EKG ordered today and personally reviewed.  The ekg ordered today demonstrates ***  Recent Labs: 08/04/2020: NT-Pro BNP 180 11/15/2020: Magnesium 2.2 02/05/2021: BUN 29; Creatinine, Ser 1.42; Potassium 3.3; Sodium 143  Recent Lipid Panel    Component Value Date/Time   CHOL 146 08/27/2017 1039   TRIG 119 08/27/2017 1039   HDL 34 (L) 08/27/2017 1039   CHOLHDL 4.3 08/27/2017 1039   CHOLHDL 3.9 03/22/2015 1136   VLDL 44 (H) 03/22/2015 1136   LDLCALC 88  08/27/2017 1039    Physical Exam:    VS:  There were no vitals taken for this visit.    Wt Readings from Last 3 Encounters:  12/13/20 245 lb (111.1 kg)  11/08/20 240 lb (108.9 kg)  08/04/20 239 lb (108.4 kg)     GEN: *** Well nourished, well developed in no acute distress HEENT: Normal NECK: No JVD; No carotid bruits LYMPHATICS: No lymphadenopathy CARDIAC: ***RRR, no murmurs, rubs, gallops RESPIRATORY:  Clear to auscultation without rales, wheezing or rhonchi  ABDOMEN: Soft, non-tender, non-distended MUSCULOSKELETAL:  No edema; No deformity  SKIN: Warm and dry NEUROLOGIC:  Alert and oriented x 3 PSYCHIATRIC:  Normal affect    Signed, Norman Herrlich, MD  03/13/2021 12:25 PM    [facility]

## 2021-03-14 ENCOUNTER — Telehealth: Payer: Self-pay

## 2021-03-14 ENCOUNTER — Other Ambulatory Visit: Payer: Self-pay

## 2021-03-14 ENCOUNTER — Ambulatory Visit: Payer: Medicare HMO | Admitting: Cardiology

## 2021-03-14 ENCOUNTER — Encounter: Payer: Self-pay | Admitting: Cardiology

## 2021-03-14 ENCOUNTER — Telehealth: Payer: Self-pay | Admitting: Cardiology

## 2021-03-14 VITALS — BP 124/62 | HR 82 | Ht 65.0 in | Wt 245.0 lb

## 2021-03-14 DIAGNOSIS — R0602 Shortness of breath: Secondary | ICD-10-CM

## 2021-03-14 DIAGNOSIS — G4739 Other sleep apnea: Secondary | ICD-10-CM

## 2021-03-14 DIAGNOSIS — R5383 Other fatigue: Secondary | ICD-10-CM

## 2021-03-14 DIAGNOSIS — I10 Essential (primary) hypertension: Secondary | ICD-10-CM | POA: Diagnosis not present

## 2021-03-14 DIAGNOSIS — E1140 Type 2 diabetes mellitus with diabetic neuropathy, unspecified: Secondary | ICD-10-CM

## 2021-03-14 DIAGNOSIS — I2510 Atherosclerotic heart disease of native coronary artery without angina pectoris: Secondary | ICD-10-CM

## 2021-03-14 DIAGNOSIS — Z794 Long term (current) use of insulin: Secondary | ICD-10-CM

## 2021-03-14 DIAGNOSIS — Z9861 Coronary angioplasty status: Secondary | ICD-10-CM

## 2021-03-14 DIAGNOSIS — E559 Vitamin D deficiency, unspecified: Secondary | ICD-10-CM

## 2021-03-14 MED ORDER — POTASSIUM CHLORIDE POWD: 40.0000 meq | 3 refills | Status: DC

## 2021-03-14 MED ORDER — TORSEMIDE 40 MG PO TABS: 40.0000 mg | ORAL_TABLET | ORAL | 3 refills | Status: DC

## 2021-03-14 NOTE — Patient Instructions (Signed)
Medication Instructions:  Your physician has recommended you make the following change in your medication: INCREASE: Torsemide 40 mg two times every other day INCREASE: Potassium 40 meq two times every other day *If you need a refill on your cardiac medications before your next appointment, please call your pharmacy*   Lab Work: Your physician recommends that you return for lab work:  TODAY: BMET, Mag, CBC, Vitamin D, B12 If you have labs (blood work) drawn today and your tests are completely normal, you will receive your results only by: Marland Kitchen MyChart Message (if you have MyChart) OR . A paper copy in the mail If you have any lab test that is abnormal or we need to change your treatment, we will call you to review the results.   Testing/Procedures: None   Follow-Up: At [facility], you and your health needs are our priority.  As part of our continuing mission to provide you with exceptional heart care, we have created designated Provider Care Teams.  These Care Teams include your primary Cardiologist (physician) and Advanced Practice Providers (APPs -  Physician Assistants and Nurse Practitioners) who all work together to provide you with the care you need, when you need it.  We recommend signing up for the patient portal called "MyChart".  Sign up information is provided on this After Visit Summary.  MyChart is used to connect with patients for Virtual Visits (Telemedicine).  Patients are able to view lab/test results, encounter notes, upcoming appointments, etc.  Non-urgent messages can be sent to your provider as well.   To learn more about what you can do with MyChart, go to [email or web address].    Your next appointment:   8 week(s)  The format for your next appointment:   In Person  Provider:   Norman Herrlich, MD   Other Instructions

## 2021-03-14 NOTE — Telephone Encounter (Signed)
Spoke with pharmacy staff.

## 2021-03-14 NOTE — Telephone Encounter (Signed)
Spoke with Delice Bison at [employer] [facility] [employer] [facility]. She was inquiring about the potassium sent in today for the patient. She was able to get the prescription fixed and states "if there is a prior authorization we will reach out."

## 2021-03-14 NOTE — Telephone Encounter (Signed)
Prior auth for Potassium CL completed and sent to [facility] through Cover my meds. Confirmation number # [id number])

## 2021-03-14 NOTE — Telephone Encounter (Signed)
Pt c/o medication issue:  1. Name of Medication: Potassium Chloride POWD  2. How are you currently taking this medication (dosage and times per day)? Pt has not started yet   3. Are you having a reaction (difficulty breathing--STAT)?   4. What is your medication issue? Pharmacy needs verification about the RX that was ordered

## 2021-03-14 NOTE — Progress Notes (Signed)
Cardiology Office Note:    Date:  03/14/2021   ID:  Johnathan Keith, DOB July 03, 1961, MRN [id number]  PCP:  Olive Bassough, Robert L, MD  Cardiologist:  Norman HerrlichBrian Munley, MD  Electrophysiologist:  None   Referring MD: Olive Bassough, Robert L, MD   I have lost a lot of appetite and been very tired  History of Present Illness:    Johnathan Keith is a 60 y.o. 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   The patient was last seen by Dr. Tomie Chinaevankar in January 2022 at that time it seems that he had gained some weight lab from his PCP were reviewed he had hypokalemia and he was asked to follow-up in 6 months.  The patient wife called recently to request an appointment due to the fact that his health has declined.  He usually see Dr. Dulce SellarMunley but she needed him to be seen.  The patient is here today with his son and I will note from his wife which reports that he has had decline since he was last seen.  They are planning on having hospice evaluate the patient on April 7.  She also notes that the patient has lost a lot of appetite he is not eating and he is getting weaker every day.  She reports that he also is hypotensive at home.  He has not been able to follow-up with his PCP.  Past Medical History:  Diagnosis Date  . Abdominal distention   . Abnormal CT scan 12/09/2018  . Abnormal nuclear cardiac imaging test    High-risk nuclear stress test   . Acute febrile illness 05/08/2019  . Acute pancreatitis 05/08/2019  . Adjustment disorder with mixed anxiety and depressed mood 07/27/2020  . AKI (acute kidney injury) (HCC) 03/26/2020  . Arthritis   . Arthropathy of cervical facet joint 01/31/2019  . Bicuspid aortic valve 03/27/2020   Formatting of this  note might be different from the original. 2020: ECHO  . Bilateral low back pain without sciatica 09/13/2015  . Bilateral lower extremity edema 08/18/2018   Bilateral lower extremity edema  Formatting of this note might be different from the original. Bilateral lower extremity edema  Last Assessment & Plan:  He does have 1-2+ pitting edema.  I am going to increase his Lasix to 80 mg a day, and will check a basic metabolic panel in 7 to 10 days.  He will see Baxter HireKristen back in 1 month for follow-up of lab work.  Marland Kitchen. BPH with obstruction/lower urinary tract symptoms 02/20/2016  . Bradycardia 04/07/2020  . CAD -S/P PCI-2016 11/30/2015   LAD DES 2016. Ad[facility] 2017 with SOB, not felt to be in CHF, Myoview was negative for sichemia  Formatting of this note might be different from the original. Overview:  LAD DES 2016. [facility] with SOB, not felt to be in CHF, Myoview was negative for sichemia  Last Assessment & Plan:  History of CAD status post LAD stenting by myself in 2016 with re-intervention because o  . Carotid artery stenosis- 09/26/2014   Rt CEA Oct 2015-Dr Chen  . Carotid stenosis 04/07/2013   Rt CEA Oct 2015-Dr Chen  Last Assessment & Plan:  History of carotid artery disease  status post right carotid endarterectomy by Dr. [name] October 2015 which he follows by duplex ultrasound. Formatting of this note might be different from the original. Rt CEA Oct 2015-Dr [name]  Last Assessment & Plan:  History of carotid artery disease status post right carotid endarterectomy by Dr. [name] October 2015  . Carpal tunnel syndrome 07/17/2015  . 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 Cerebrovascular accident (CVA) due to thrombosis of right middle cerebral artery (HCC) 07/04/2014   Questionable small stroke in 2014 with transient Lt sided weakness in setting of carotid disease.  Formatting of this note might be different from the original. Overview:   Questionable small stroke in 2014 with transient Lt sided weakness in setting of carotid disease.  Last Assessment & Plan:  History of multiple strokes in the past dating back to 2014 with a recent stroke in April of this year a  . Cervical spine disease 04/07/2013  . Cervical spondylosis without myelopathy 01/31/2019  . Cholecystitis 06/24/2019  . Cholelithiases 12/09/2018  . Chronic diarrhea 12/09/2018  . Chronic hypoxemic respiratory failure (HCC) 06/19/2020  . Chronic neck and back pain   . Chronic pain disorder 01/31/2019  . Clostridium difficile diarrhea    negative culture 08/17/19  . Community acquired pneumonia 06/19/2020   Formatting of this note might be different from the original. 2021: hosp  . Coronary artery disease    drug-eluting stents placed in proximal and mid LAD  . Coronary artery disease with history of myocardial infarction without history of CABG 04/07/2013  . DDD (degenerative disc disease), cervical 01/31/2019  . Degenerative joint disease of thoracic spine 12/09/2018  . Deviated septum   . Diabetes mellitus type 2, uncontrolled (HCC) 04/07/2013  . Diabetic polyneuropathy associated with type 2 diabetes mellitus (HCC) 01/31/2019  . Diabetic retinopathy (HCC)   . Dyslipidemia 12/09/2018  . Dyspnea on exertion 03/26/2020   Dyspnea on exertion  Formatting of this note might be different from the original. Overview:  Dyspnea on exertion  Last Assessment & Plan:  Improved after stenting of his LAD  . Elevated troponin 12/09/2018  . Enteritis 11/30/2020   Formatting of this note might be different from the original. 11/30/2020:  . Erectile dysfunction 11/25/2015  . Essential hypertension 04/07/2013   hypertension  Formatting of this note might be different from the original. hypertension  Last Assessment & Plan:  History of essential hypertension her blood pressure measured at 162/70.  He is on benazepril 10 the morning and 20 in the evening as well as hydralazine, hydrochlorothiazide, and  metoprolol.  Blood pressures have been on the high side.  I am going to increase his benazepril to 20 mg   . GERD (gastroesophageal reflux disease)    "occasionally" (03/23/2015)  . Headache    "more than 2/wk" (03/23/2015)  . Heart attack (HCC)    "in 1997 was told I had signs of a small heart attack that I didn't know I'd had"  . High cholesterol   . History of cardioembolic cerebrovascular accident (CVA) 12/09/2018  . History of gout    "when I was younger"  . History of kidney stones    passed  . History of stroke 04/20/2018  . Hyperlipidemia, unspecified 04/07/2013   hyperlipidemia  Formatting of this note might be different from the original. IMO routine update IMO routine update hyperlipidemia  Last Assessment & Plan:  History of hyperlipidemia on pravastatin with lipid profile performed by his PCP 03/17/2018  revealing total cholesterol 126, LDL 55 HDL 24. Formatting of this note might be different from the original. hyperlipidemia  Last Assessment & Plan:  Hi  . Hypertension   . 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 Kidney disease   . Large liver 12/09/2018  . Lower back pain 06/14/2015  . Lumbar facet joint pain 06/14/2015  . Lumbar foraminal stenosis 01/31/2019  . Lymphocytosis 05/24/2015  . Memory loss 03/15/2020  . Migraine    "had my 1st and only in 12/2014" (03/23/2015)  . Morbid obesity (HCC) 11/08/2020  . Neural foraminal stenosis of cervical spine 01/31/2019  . Neutrophilia 05/24/2015  . Non morbid obesity due to excess calories 02/12/2016  . Occipital neuralgia of right side 12/18/2016  . Occlusion and stenosis of unspecified carotid artery 04/07/2013   Formatting of this note might be different from the original. Overview:  Rt CEA Oct 2015-Dr [name]  Last Assessment & Plan:  History of carotid artery disease status post right carotid endarterectomy by Dr. [name] October 2015 which he follows by duplex ultrasound.  . Peripheral neuropathy    feet  . Peripheral vascular disease (HCC)    carotid artery  disease  . Pneumonia 11/19,1/20  . PONV (postoperative nausea and vomiting) 2015   "was told I was confused and combative in recovery from the narcotics w/my carotid OR"  "During a colonoscopy my oxygen level dropped so low that I had to be woke up.- 03/2017 colonoscopy [facility]   . Sepsis (HCC) 05/08/2019  . Serum total bilirubin elevated 05/08/2019  . Sleep apnea    "severe; couldn't afford mask" (03/23/2015) uses O2 at night.no C-pap  . SOB (shortness of breath) 05/04/2019  . Spondylosis of lumbar spine 01/31/2019  . Stroke [facility]) 2014   "on walker for 2 months; left side is weaker and numb since" (03/23/2015), "2 confirmed strokes"  . Stroke (HCC) 05/10/2019   Aphasia mild  . Stroke, lacunar (HCC) 04/07/2013  . Transaminitis 05/08/2019  . Type 2 diabetes mellitus with diabetic neuropathy (HCC) 12/09/2018  . Type 2 diabetes mellitus with diabetic neuropathy, with long-term current use of insulin (HCC) 07/04/2014  . Type 2 diabetes mellitus, with long-term current use of insulin (HCC) 02/12/2016  . Type II diabetes mellitus (HCC)     Past Surgical History:  Procedure Laterality Date  . BILIARY DILATION  08/11/2019   Procedure: BILIARY DILATION;  Surgeon: Meridee Score Netty Starring., MD;  Location: [facility] ENDOSCOPY;  Service: Gastroenterology;;  . BIOPSY  08/11/2019   Procedure: BIOPSY;  Surgeon: Lemar Lofty., MD;  Location: [facility] ENDOSCOPY;  Service: Gastroenterology;;  . CHOLECYSTECTOMY N/A 08/31/2019   Procedure: LAPAROSCOPIC CHOLECYSTECTOMY;  Surgeon: Gaynelle Adu, MD;  Location: [facility] ORS;  Service: General;  Laterality: N/A;  . CIRCUMCISION  1981  . COLONOSCOPY W/ POLYPECTOMY    . CORONARY ANGIOPLASTY WITH STENT PLACEMENT  03/23/2015   "2"  . CORONARY STENT INTERVENTION N/A 09/29/2017   Procedure: CORONARY STENT INTERVENTION;  Surgeon: Runell Gess, MD;  Location: [facility] INVASIVE CV LAB;  Service: Cardiovascular;  Laterality: N/A;  . ENDARTERECTOMY Right 09/26/2014   Procedure: RIGHT  CAROTID ENDARTERECTOMY WITH PATCH ANGIOPLASTY;  Surgeon: Fransisco Hertz, MD;  Location: [facility] OR;  Service: Vascular;  Laterality: Right;  . ENDOSCOPIC RETROGRADE CHOLANGIOPANCREATOGRAPHY (ERCP) WITH PROPOFOL N/A 08/11/2019   Procedure: ENDOSCOPIC RETROGRADE CHOLANGIOPANCREATOGRAPHY (ERCP) WITH PROPOFOL;  Surgeon: Lemar Lofty., MD;  Location: [facility] ENDOSCOPY;  Service: Gastroenterology;  Laterality: N/A;  . ESOPHAGOGASTRODUODENOSCOPY (EGD) WITH PROPOFOL N/A 08/11/2019   Procedure:  ESOPHAGOGASTRODUODENOSCOPY (EGD) WITH PROPOFOL;  Surgeon: Mansouraty, Netty Starring., MD;  Location: [facility] ENDOSCOPY;  Service: Gastroenterology;  Laterality: N/A;  . EYE SURGERY Right May 2016   Cataract  . EYE SURGERY Left July 2016   Cataract  . HYDROCELE EXCISION  ~ 2008  . IR EXCHANGE BILIARY DRAIN  07/21/2019  . IR PERC CHOLECYSTOSTOMY  05/11/2019  . IR RADIOLOGIST EVAL & MGMT  06/30/2019  . LEFT HEART CATH AND CORONARY ANGIOGRAPHY N/A 09/29/2017   Procedure: LEFT HEART CATH AND CORONARY ANGIOGRAPHY;  Surgeon: Runell Gess, MD;  Location: [facility] INVASIVE CV LAB;  Service: Cardiovascular;  Laterality: N/A;  . LEFT HEART CATHETERIZATION WITH CORONARY ANGIOGRAM N/A 03/23/2015   Procedure: LEFT HEART CATHETERIZATION WITH CORONARY ANGIOGRAM;  Surgeon: Runell Gess, MD;  Location: [facility] CATH LAB;  Service: Cardiovascular;  Laterality: N/A;  . REFRACTIVE SURGERY Bilateral 08/2014-09/2014   Diabetic retinopathy   . REMOVAL OF STONES  08/11/2019   Procedure: REMOVAL OF STONES;  Surgeon: Meridee Score Netty Starring., MD;  Location: [facility] ENDOSCOPY;  Service: Gastroenterology;;  . Gaspar Bidding DILATION N/A 08/11/2019   Procedure: Gaspar Bidding DILATION;  Surgeon: Lemar Lofty., MD;  Location: [facility] ENDOSCOPY;  Service: Gastroenterology;  Laterality: N/A;  . SPHINCTEROTOMY  08/11/2019   Procedure: SPHINCTEROTOMY;  Surgeon: Meridee Score Netty Starring., MD;  Location: [facility] ENDOSCOPY;  Service: Gastroenterology;;    Current Medications: Current Meds   Medication Sig  . ALPRAZolam (XANAX) 0.25 MG tablet Take 0.25 mg by mouth at bedtime as needed for anxiety.  Marland Kitchen aspirin 81 MG tablet Take 1 tablet (81 mg total) by mouth daily.  Marland Kitchen atorvastatin (LIPITOR) 80 MG tablet Take 80 mg by mouth daily.  . Budeson-Glycopyrrol-Formoterol (BREZTRI AEROSPHERE) 160-9-4.8 MCG/ACT AERO Inhale 1 puff into the lungs as needed.  . cholecalciferol (VITAMIN D3) 25 MCG (1000 UT) tablet Take 1,000 Units by mouth daily.  . cholestyramine (QUESTRAN) 4 g packet Take 1 packet (4 g total) by mouth daily in the afternoon. Take 2 hours before medication or after medication  . clopidogrel (PLAVIX) 75 MG tablet Take 1 tablet (75 mg total) by mouth daily.  . diphenoxylate-atropine (LOMOTIL) 2.5-0.025 MG tablet Take 1 tablet by mouth 3 (three) times daily as needed for diarrhea or loose stools.  . Fluticasone-Umeclidin-Vilant (TRELEGY ELLIPTA) 100-62.5-25 MCG/INH AEPB Inhale 2 puffs into the lungs 2 (two) times daily.  Marland Kitchen gabapentin (NEURONTIN) 300 MG capsule Take 300 mg by mouth at bedtime.  . insulin NPH Human (NOVOLIN N) 100 UNIT/ML injection Inject 55 Units into the skin 2 (two) times daily.  . insulin regular (NOVOLIN R) 100 units/mL injection Inject 55 Units into the skin 2 (two) times daily. Pt is on a sliding scale  . ipratropium-albuterol (DUONEB) 0.5-2.5 (3) MG/3ML SOLN Inhale 3 mLs into the lungs 4 (four) times daily as needed for wheezing or shortness of breath.  . levofloxacin (LEVAQUIN) 250 MG tablet Take 250 mg by mouth daily.  Marland Kitchen loperamide (IMODIUM A-D) 2 MG tablet Imodium A-D 2 mg tablet  use 1 pill after each bowel moement up to 4 daily  . metoprolol succinate (TOPROL-XL) 50 MG 24 hr tablet Take 1 tablet (50 mg total) by mouth daily.  . montelukast (SINGULAIR) 10 MG tablet Take 10 mg by mouth at bedtime.  . nitroGLYCERIN (NITROSTAT) 0.4 MG SL tablet Place 1 tablet (0.4 mg total) under the tongue every 5 (five) minutes as needed for chest pain.  Marland Kitchen oxybutynin  (DITROPAN) 5 MG tablet Take 5 mg by mouth at bedtime.   Marland Kitchen  Pancrelipase, Lip-Prot-Amyl, (ZENPEP) 40000-126000 units CPEP Take 2 tablet with each meal.  . pantoprazole (PROTONIX) 40 MG tablet Take 1 tablet (40 mg total) by mouth daily.  . Polyvinyl Alcohol-Povidone (REFRESH OP) Place 1 drop into both eyes daily as needed (dry eyes).  . Potassium Chloride POWD Take 40 mEq by mouth every other day. Take 40 meq twice every other day  . potassium chloride SA (KLOR-CON) 20 MEQ tablet Take 2 tablets (40 mEq total) by mouth 2 (two) times daily.  . tamsulosin (FLOMAX) 0.4 MG CAPS capsule TAKE ONE CAPSULE BY MOUTH TWICE DAILY 30 MINUTES AFTER MEAL  . Torsemide 40 MG TABS Take 40 mg by mouth every other day. Take 40 mg twice every other day  . [DISCONTINUED] torsemide (DEMADEX) 20 MG tablet Take 2 tablets (40 mg total) by mouth 2 (two) times daily.     Allergies:   Metformin   Social History   Socioeconomic History  . Marital status: Married    Spouse name: Not on file  . Number of children: 5  . Years of education: 12th  . Highest education level: Not on file  Occupational History  . Occupation: Disabled  Tobacco Use  . Smoking status: Never Smoker  . Smokeless tobacco: Never Used  Vaping Use  . Vaping Use: Never used  Substance and Sexual Activity  . Alcohol use: No    Alcohol/week: 0.0 standard drinks  . Drug use: No  . Sexual activity: Not Currently    Partners: Female  Other Topics Concern  . Not on file  Social History Narrative   Patient is married with 5 children.    Patient is right handed.   Patient has hs education.   Patient drinks 4 12 oz can sodas daily.   Social Determinants of Health   Financial Resource Strain: Not on file  Food Insecurity: Not on file  Transportation Needs: Not on file  Physical Activity: Not on file  Stress: Not on file  Social Connections: Not on file     Family History: The patient's family history includes Asthma in his brother; Cancer  in his father; Colon cancer in his brother and sister; Diabetes in his brother, daughter, maternal grandfather, and mother; Gout in his brother and maternal uncle; Heart attack in his father; Heart disease in his father and mother; Hyperlipidemia in his brother, mother, and sister; Hypertension in his brother, mother, and sister; Stroke in his paternal grandfather.  ROS:   Review of Systems  Constitution: Negative for decreased appetite, fever and weight gain.  HENT: Negative for congestion, ear discharge, hoarse voice and sore throat.   Eyes: Negative for discharge, redness, vision loss in right eye and visual halos.  Cardiovascular: Negative for chest pain, dyspnea on exertion, leg swelling, orthopnea and palpitations.  Respiratory: Negative for cough, hemoptysis, shortness of breath and snoring.   Endocrine: Negative for heat intolerance and polyphagia.  Hematologic/Lymphatic: Negative for bleeding problem. Does not bruise/bleed easily.  Skin: Negative for flushing, nail changes, rash and suspicious lesions.  Musculoskeletal: Negative for arthritis, joint pain, muscle cramps, myalgias, neck pain and stiffness.  Gastrointestinal: Negative for abdominal pain, bowel incontinence, diarrhea and excessive appetite.  Genitourinary: Negative for decreased libido, genital sores and incomplete emptying.  Neurological: Negative for brief paralysis, focal weakness, headaches and loss of balance.  Psychiatric/Behavioral: Negative for altered mental status, depression and suicidal ideas.  Allergic/Immunologic: Negative for HIV exposure and persistent infections.    EKGs/Labs/Other Studies Reviewed:    The following  studies were reviewed today:   EKG: None today.  Recent Labs: 08/04/2020: NT-Pro BNP 180 11/15/2020: Magnesium 2.2 02/05/2021: BUN 29; Creatinine, Ser 1.42; Potassium 3.3; Sodium 143  Recent Lipid Panel    Component Value Date/Time   CHOL 146 08/27/2017 1039   TRIG 119 08/27/2017 1039    HDL 34 (L) 08/27/2017 1039   CHOLHDL 4.3 08/27/2017 1039   CHOLHDL 3.9 03/22/2015 1136   VLDL 44 (H) 03/22/2015 1136   LDLCALC 88 08/27/2017 1039    Physical Exam:    VS:  BP 124/62   Pulse 82   Ht 5\' 5"  (1.651 m)   Wt 245 lb (111.1 kg)   SpO2 93%   BMI 40.77 kg/m     Wt Readings from Last 3 Encounters:  03/14/21 245 lb (111.1 kg)  12/13/20 245 lb (111.1 kg)  11/08/20 240 lb (108.9 kg)     GEN: Well nourished, well developed in no acute distress HEENT: Normal NECK: No JVD; No carotid bruits LYMPHATICS: No lymphadenopathy CARDIAC: S1S2 noted,RRR, no murmurs, rubs, gallops RESPIRATORY:  Clear to auscultation without rales, wheezing or rhonchi  ABDOMEN: Soft, non-tender, non-distended, +bowel sounds, no guarding. EXTREMITIES: +2 bilateral leg edema, No cyanosis, no clubbing MUSCULOSKELETAL:  No deformity  SKIN: Warm and dry NEUROLOGIC:  Alert and oriented x 3, non-focal PSYCHIATRIC:  Normal affect, good insight  ASSESSMENT:    1. Fatigue, unspecified type   2. Vitamin D deficiency   3. Essential hypertension   4. Shortness of breath   5. CAD -S/P PCI-2016   6. Other sleep apnea   7. Type 2 diabetes mellitus with diabetic neuropathy, with long-term current use of insulin (HCC)    PLAN:     I was able to discuss with the patient and his son giving his fatigue and decreased appetite he really needs to be seen by his PCP for full evaluation.  They have hospice coming on April 7 to evaluate the patient based on his family request.  He has not seen his PCP he needs to set up an appointment.  For now I am going to get blood work which includes CBC BMP as well he may B12 and vitamin D level.  I encouraged the patient that hopefully he can be able to find some things that he likes to eat the eat more that.  He also can talk to his PCP for any appetite stimulants.  In terms of his hypotension he is on torsemide 40 mg twice a day daily will be going to do is cut back on  torsemide 40 mg every other day.  He will remain on his metolazone as he does still have some volume on his lower extremity with his +2 pitting edema.  We will assess his kidney function.  In terms of his coronary artery disease he has no angina symptoms. Sleep apnea continue use of his CPAP which he says is difficult.  He remains in a wheelchair and he tells me that he is getting weaker every day he does have home physical therapy which she is working with him.  For hopefully he can gain his strength.  But ideally he still needs to follow-up with his PCP.  Hopefully cutting back on his diuretics can help with the hypotension but he really needs to have the diuretics given this patient heart failure and his low threshold to go into exacerbation.  The patient is in agreement with the above plan. The patient left the office in stable  condition.  The patient will follow up in 8 weeks with Dr. Dulce Sellar   Medication Adjustments/Labs and Tests Ordered: Current medicines are reviewed at length with the patient today.  Concerns regarding medicines are outlined above.  Orders Placed This Encounter  Procedures  . Basic metabolic panel  . CBC with Differential/Platelet  . Magnesium  . Vitamin B12  . VITAMIN D 25 Hydroxy (Vit-D Deficiency, Fractures)  . EKG 12-Lead   Meds ordered this encounter  Medications  . Torsemide 40 MG TABS    Sig: Take 40 mg by mouth every other day. Take 40 mg twice every other day    Dispense:  180 tablet    Refill:  3  . Potassium Chloride POWD    Sig: Take 40 mEq by mouth every other day. Take 40 meq twice every other day    Dispense:  500 g    Refill:  3    Patient Instructions  Medication Instructions:  Your physician has recommended you make the following change in your medication: INCREASE: Torsemide 40 mg two times every other day INCREASE: Potassium 40 meq two times every other day *If you need a refill on your cardiac medications before your next  appointment, please call your pharmacy*   Lab Work: Your physician recommends that you return for lab work:  TODAY: BMET, Mag, CBC, Vitamin D, B12 If you have labs (blood work) drawn today and your tests are completely normal, you will receive your results only by: Marland Kitchen MyChart Message (if you have MyChart) OR . A paper copy in the mail If you have any lab test that is abnormal or we need to change your treatment, we will call you to review the results.   Testing/Procedures: None   Follow-Up: At [facility], you and your health needs are our priority.  As part of our continuing mission to provide you with exceptional heart care, we have created designated Provider Care Teams.  These Care Teams include your primary Cardiologist (physician) and Advanced Practice Providers (APPs -  Physician Assistants and Nurse Practitioners) who all work together to provide you with the care you need, when you need it.  We recommend signing up for the patient portal called "MyChart".  Sign up information is provided on this After Visit Summary.  MyChart is used to connect with patients for Virtual Visits (Telemedicine).  Patients are able to view lab/test results, encounter notes, upcoming appointments, etc.  Non-urgent messages can be sent to your provider as well.   To learn more about what you can do with MyChart, go to [email or web address].    Your next appointment:   8 week(s)  The format for your next appointment:   In Person  Provider:   Norman Herrlich, MD   Other Instructions      Adopting a Healthy Lifestyle.  Know what a healthy weight is for you (roughly BMI <25) and aim to maintain this   Aim for 7+ servings of fruits and vegetables daily   65-80+ fluid ounces of water or unsweet tea for healthy kidneys   Limit to max 1 drink of alcohol per day; avoid smoking/tobacco   Limit animal fats in diet for cholesterol and heart health - choose grass fed whenever available   Avoid  highly processed foods, and foods high in saturated/trans fats   Aim for low stress - take time to unwind and care for your mental health   Aim for 150 min of moderate intensity exercise weekly for heart  health, and weights twice weekly for bone health   Aim for 7-9 hours of sleep daily   When it comes to diets, agreement about the perfect plan isnt easy to find, even among the experts. Experts at the [facility] of [employer] developed an idea known as the Healthy Eating Plate. Just imagine a plate divided into logical, healthy portions.   The emphasis is on diet quality:   Load up on vegetables and fruits - one-half of your plate: Aim for color and variety, and remember that potatoes dont count.   Go for whole grains - one-quarter of your plate: Whole wheat, barley, wheat berries, quinoa, oats, brown rice, and foods made with them. If you want pasta, go with whole wheat pasta.   Protein power - one-quarter of your plate: Fish, chicken, beans, and nuts are all healthy, versatile protein sources. Limit red meat.   The diet, however, does go beyond the plate, offering a few other suggestions.   Use healthy plant oils, such as olive, canola, soy, corn, sunflower and peanut. Check the labels, and avoid partially hydrogenated oil, which have unhealthy trans fats.   If youre thirsty, drink water. Coffee and tea are good in moderation, but skip sugary drinks and limit milk and dairy products to one or two daily servings.   The type of carbohydrate in the diet is more important than the amount. Some sources of carbohydrates, such as vegetables, fruits, whole grains, and beans-are healthier than others.   Finally, stay active  Signed, Thomasene Ripple, DO  03/14/2021 12:23 PM    [facility]

## 2021-03-15 LAB — CBC WITH DIFFERENTIAL/PLATELET
Basophils Absolute: 0.1 10*3/uL (ref 0.0–0.2)
Basos: 0 %
EOS (ABSOLUTE): 0.2 10*3/uL (ref 0.0–0.4)
Eos: 1 %
Hematocrit: 35.4 % — ABNORMAL LOW (ref 37.5–51.0)
Hemoglobin: 11.6 g/dL — ABNORMAL LOW (ref 13.0–17.7)
Immature Grans (Abs): 0.2 10*3/uL — ABNORMAL HIGH (ref 0.0–0.1)
Immature Granulocytes: 1 %
Lymphocytes Absolute: 2.1 10*3/uL (ref 0.7–3.1)
Lymphs: 11 %
MCH: 29 pg (ref 26.6–33.0)
MCHC: 32.8 g/dL (ref 31.5–35.7)
MCV: 89 fL (ref 79–97)
Monocytes Absolute: 1.7 10*3/uL — ABNORMAL HIGH (ref 0.1–0.9)
Monocytes: 9 %
Neutrophils Absolute: 15.3 10*3/uL — ABNORMAL HIGH (ref 1.4–7.0)
Neutrophils: 78 %
Platelets: 530 10*3/uL — ABNORMAL HIGH (ref 150–450)
RBC: 4 x10E6/uL — ABNORMAL LOW (ref 4.14–5.80)
RDW: 15.8 % — ABNORMAL HIGH (ref 11.6–15.4)
WBC: 19.6 10*3/uL — ABNORMAL HIGH (ref 3.4–10.8)

## 2021-03-15 LAB — VITAMIN B12: Vitamin B-12: 337 pg/mL (ref 232–1245)

## 2021-03-15 LAB — BASIC METABOLIC PANEL
BUN/Creatinine Ratio: 33 — ABNORMAL HIGH (ref 10–24)
BUN: 47 mg/dL — ABNORMAL HIGH (ref 8–27)
CO2: 23 mmol/L (ref 20–29)
Calcium: 9.7 mg/dL (ref 8.6–10.2)
Chloride: 90 mmol/L — ABNORMAL LOW (ref 96–106)
Creatinine, Ser: 1.44 mg/dL — ABNORMAL HIGH (ref 0.76–1.27)
Glucose: 187 mg/dL — ABNORMAL HIGH (ref 65–99)
Potassium: 3.6 mmol/L (ref 3.5–5.2)
Sodium: 137 mmol/L (ref 134–144)
eGFR: 55 mL/min/{1.73_m2} — ABNORMAL LOW (ref >59)

## 2021-03-15 LAB — MAGNESIUM: Magnesium: 2 mg/dL (ref 1.6–2.3)

## 2021-03-15 LAB — VITAMIN D 25 HYDROXY (VIT D DEFICIENCY, FRACTURES): Vit D, 25-Hydroxy: 46.7 ng/mL (ref 30.0–100.0)

## 2021-03-20 ENCOUNTER — Other Ambulatory Visit: Payer: Self-pay | Admitting: Cardiology

## 2021-03-20 NOTE — Telephone Encounter (Signed)
Potassium CL approved and sent 

## 2021-05-05 ENCOUNTER — Other Ambulatory Visit: Payer: Self-pay | Admitting: Cardiology

## 2021-05-08 NOTE — Telephone Encounter (Signed)
Refill sent to pharmacy.   

## 2021-05-10 ENCOUNTER — Ambulatory Visit: Payer: Medicare HMO | Admitting: Cardiology

## 2021-05-22 ENCOUNTER — Other Ambulatory Visit: Payer: Self-pay | Admitting: Cardiology

## 2021-05-22 NOTE — Telephone Encounter (Signed)
Refill sent to pharmacy.   

## 2021-06-14 DIAGNOSIS — D649 Anemia, unspecified: Secondary | ICD-10-CM | POA: Insufficient documentation

## 2021-06-21 ENCOUNTER — Ambulatory Visit: Payer: Medicare HMO | Admitting: Cardiology

## 2021-07-02 DIAGNOSIS — U071 COVID-19: Secondary | ICD-10-CM | POA: Insufficient documentation

## 2021-07-02 DIAGNOSIS — B3749 Other urogenital candidiasis: Secondary | ICD-10-CM | POA: Insufficient documentation

## 2021-09-23 NOTE — Progress Notes (Deleted)
Cardiology Office Note:    Date:  09/23/2021   ID:  Johnathan Keith, DOB Dec 01, 1961, MRN [id number]  PCP:  Olive Bass, MD  Cardiologist:  Norman Herrlich, MD    Referring MD: Olive Bass, MD    ASSESSMENT:    No diagnosis found. PLAN:    In order of problems listed above:  ***   Next appointment: ***   Medication Adjustments/Labs and Tests Ordered: Current medicines are reviewed at length with the patient today.  Concerns regarding medicines are outlined above.  No orders of the defined types were placed in this encounter.  No orders of the defined types were placed in this encounter.   No chief complaint on file.   History of Present Illness:    Johnathan Keith is a 60 y.o. male with a hx of CAD hypertensive heart disease with heart failure ejection fraction 40 to 45% hyperlipidemia diabetes carotid disease with a right carotid endarterectomy and stroke in June 2020 last seen 08/04/2020.Coronary angiography was performed 09/29/2017 showed an 80% mid LAD stenosis with PCI and previous stent drug-eluting stent 2016 proximal mid LAD patent.   Compliance with diet, lifestyle and medications: ***  He was admitted to [facility] 06/18/2022 with Altered mental status Laboratory studies showed a hemoglobin of 8.0 creatinine 0.7 potassium 3.9 sodium 137 chest x-ray showed low lung volume diagnosis was metabolic encephalopathy Chart review shows that he is now on the stay well program and had another admission at [facility] in September with encephalopathy and prolonged appointments Past Medical History:  Diagnosis Date   Abdominal distention    Abnormal CT scan 12/09/2018   Abnormal nuclear cardiac imaging test    High-risk nuclear stress test    Acute febrile illness 05/08/2019   Acute pancreatitis 05/08/2019   Adjustment disorder with mixed anxiety and depressed mood 07/27/2020   AKI (acute kidney injury) (HCC) 03/26/2020   Arthritis    Arthropathy of  cervical facet joint 01/31/2019   Bicuspid aortic valve 03/27/2020   Formatting of this note might be different from the original. 2020: ECHO   Bilateral low back pain without sciatica 09/13/2015   Bilateral lower extremity edema 08/18/2018   Bilateral lower extremity edema  Formatting of this note might be different from the original. Bilateral lower extremity edema  Last Assessment & Plan:  He does have 1-2+ pitting edema.  I am going to increase his Lasix to 80 mg a day, and will check a basic metabolic panel in 7 to 10 days.  He will see Baxter Hire back in 1 month for follow-up of lab work.   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 Admitted to [facility] Sept 2017 with SOB, not felt to be in CHF, Myoview was negative for sichemia  Formatting of this note might be different from the original. Overview:  LAD DES 2016. Admitted to [facility] Sept 2017 with SOB, not felt to be in CHF, Myoview was negative for sichemia  Last Assessment & Plan:  History of CAD status post LAD stenting by myself in 2016 with re-intervention because o   Carotid artery stenosis- 09/26/2014   Rt CEA Oct 2015-Dr Chen   Carotid stenosis 04/07/2013   Rt CEA Oct 2015-Dr Chen  Last Assessment & Plan:  History of carotid artery disease status post right carotid endarterectomy by Dr. [name] October 2015 which he follows by duplex ultrasound. Formatting of this  note might be different from the original. Rt CEA Oct 2015-Dr [name]  Last Assessment & Plan:  History of carotid artery disease status post right carotid endarterectomy by Dr. [name] October 2015   Carpal tunnel syndrome 07/17/2015   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HCC) 09/13/2015   Cerebrovascular accident (CVA) due to thrombosis of right middle cerebral artery (HCC) 07/04/2014   Questionable small stroke in 2014 with transient Lt sided weakness in setting of carotid disease.   Formatting of this note might be different from the original. Overview:  Questionable small stroke in 2014 with transient Lt sided weakness in setting of carotid disease.  Last Assessment & Plan:  History of multiple strokes in the past dating back to 2014 with a recent stroke in April of this year a   Cervical spine disease 04/07/2013   Cervical spondylosis without myelopathy 01/31/2019   Cholecystitis 06/24/2019   Cholelithiases 12/09/2018   Chronic diarrhea 12/09/2018   Chronic hypoxemic respiratory failure (HCC) 06/19/2020   Chronic neck and back pain    Chronic pain disorder 01/31/2019   Clostridium difficile diarrhea    negative culture 08/17/19   Community acquired pneumonia 06/19/2020   Formatting of this note might be different from the original. 2021: hosp   Coronary artery disease    drug-eluting stents placed in proximal and mid LAD   Coronary artery disease with history of myocardial infarction without history of CABG 04/07/2013   DDD (degenerative disc disease), cervical 01/31/2019   Degenerative joint disease of thoracic spine 12/09/2018   Deviated septum    Diabetes mellitus type 2, uncontrolled (HCC) 04/07/2013   Diabetic polyneuropathy associated with type 2 diabetes mellitus (HCC) 01/31/2019   Diabetic retinopathy (HCC)    Dyslipidemia 12/09/2018   Dyspnea on exertion 03/26/2020   Dyspnea on exertion  Formatting of this note might be different from the original. Overview:  Dyspnea on exertion  Last Assessment & Plan:  Improved after stenting of his LAD   Elevated troponin 12/09/2018   Enteritis 11/30/2020   Formatting of this note might be different from the original. 11/30/2020:   Erectile dysfunction 11/25/2015   Essential hypertension 04/07/2013   hypertension  Formatting of this note might be different from the original. hypertension  Last Assessment & Plan:  History of essential hypertension her blood pressure measured at 162/70.  He is on benazepril 10 the morning and 20 in the evening  as well as hydralazine, hydrochlorothiazide, and metoprolol.  Blood pressures have been on the high side.  I am going to increase his benazepril to 20 mg    GERD (gastroesophageal reflux disease)    "occasionally" (03/23/2015)   Headache    "more than 2/wk" (03/23/2015)   Heart attack (HCC)    "in 1997 was told I had signs of a small heart attack that I didn't know I'd had"   High cholesterol    History of cardioembolic cerebrovascular accident (CVA) 12/09/2018   History of gout    "when I was younger"   History of kidney stones    passed   History of stroke 04/20/2018   Hyperlipidemia, unspecified 04/07/2013   hyperlipidemia  Formatting of this note might be different from the original. IMO routine update IMO routine update hyperlipidemia  Last Assessment & Plan:  History of hyperlipidemia on pravastatin with lipid profile performed by his PCP 03/17/2018 revealing total cholesterol 126, LDL 55 HDL 24. Formatting of this note might be different from the original. hyperlipidemia  Last Assessment & Plan:  Hi   Hypertension    Hypogonadism male 01/11/2016   Kidney disease    Large liver 12/09/2018   Lower back pain 06/14/2015   Lumbar facet joint pain 06/14/2015   Lumbar foraminal stenosis 01/31/2019   Lymphocytosis 05/24/2015   Memory loss 03/15/2020   Migraine    "had my 1st and only in 12/2014" (03/23/2015)   Morbid obesity (HCC) 11/08/2020   Neural foraminal stenosis of cervical spine 01/31/2019   Neutrophilia 05/24/2015   Non morbid obesity due to excess calories 02/12/2016   Occipital neuralgia of right side 12/18/2016   Occlusion and stenosis of unspecified carotid artery 04/07/2013   Formatting of this note might be different from the original. Overview:  Rt CEA Oct 2015-Dr Chen  Last Assessment & Plan:  History of carotid artery disease status post right carotid endarterectomy by Dr. [name] October 2015 which he follows by duplex ultrasound.   Peripheral neuropathy    feet   Peripheral vascular disease  (HCC)    carotid artery disease   Pneumonia 11/19,1/20   PONV (postoperative nausea and vomiting) 2015   "was told I was confused and combative in recovery from the narcotics w/my carotid OR"  "During a colonoscopy my oxygen level dropped so low that I had to be woke up.- 03/2017 colonoscopy [facility]    Sepsis [facility]) 05/08/2019   Serum total bilirubin elevated 05/08/2019   Sleep apnea    "severe; couldn't afford mask" (03/23/2015) uses O2 at night.no C-pap   SOB (shortness of breath) 05/04/2019   Spondylosis of lumbar spine 01/31/2019   Stroke [facility]) 2014   "on walker for 2 months; left side is weaker and numb since" (03/23/2015), "2 confirmed strokes"   Stroke (HCC) 05/10/2019   Aphasia mild   Stroke, lacunar (HCC) 04/07/2013   Transaminitis 05/08/2019   Type 2 diabetes mellitus with diabetic neuropathy (HCC) 12/09/2018   Type 2 diabetes mellitus with diabetic neuropathy, with long-term current use of insulin (HCC) 07/04/2014   Type 2 diabetes mellitus, with long-term current use of insulin (HCC) 02/12/2016   Type II diabetes mellitus (HCC)     Past Surgical History:  Procedure Laterality Date   BILIARY DILATION  08/11/2019   Procedure: BILIARY DILATION;  Surgeon: Lemar Lofty., MD;  Location: [facility] ENDOSCOPY;  Service: Gastroenterology;;   BIOPSY  08/11/2019   Procedure: BIOPSY;  Surgeon: Lemar Lofty., MD;  Location: [facility] ENDOSCOPY;  Service: Gastroenterology;;   CHOLECYSTECTOMY N/A 08/31/2019   Procedure: LAPAROSCOPIC CHOLECYSTECTOMY;  Surgeon: Gaynelle Adu, MD;  Location: [facility] ORS;  Service: General;  Laterality: N/A;   CIRCUMCISION  1981   COLONOSCOPY W/ POLYPECTOMY     CORONARY ANGIOPLASTY WITH STENT PLACEMENT  03/23/2015   "2"   CORONARY STENT INTERVENTION N/A 09/29/2017   Procedure: CORONARY STENT INTERVENTION;  Surgeon: Runell Gess, MD;  Location: [facility] INVASIVE CV LAB;  Service: Cardiovascular;  Laterality: N/A;   ENDARTERECTOMY Right 09/26/2014   Procedure: RIGHT  CAROTID ENDARTERECTOMY WITH PATCH ANGIOPLASTY;  Surgeon: Fransisco Hertz, MD;  Location: [facility] OR;  Service: Vascular;  Laterality: Right;   ENDOSCOPIC RETROGRADE CHOLANGIOPANCREATOGRAPHY (ERCP) WITH PROPOFOL N/A 08/11/2019   Procedure: ENDOSCOPIC RETROGRADE CHOLANGIOPANCREATOGRAPHY (ERCP) WITH PROPOFOL;  Surgeon: Lemar Lofty., MD;  Location: [facility] ENDOSCOPY;  Service: Gastroenterology;  Laterality: N/A;   ESOPHAGOGASTRODUODENOSCOPY (EGD) WITH PROPOFOL N/A 08/11/2019   Procedure: ESOPHAGOGASTRODUODENOSCOPY (EGD) WITH PROPOFOL;  Surgeon: Meridee Score Netty Starring., MD;  Location: [facility] ENDOSCOPY;  Service: Gastroenterology;  Laterality: N/A;  EYE SURGERY Right May 2016   Cataract   EYE SURGERY Left July 2016   Cataract   HYDROCELE EXCISION  ~ 2008   IR EXCHANGE BILIARY DRAIN  07/21/2019   IR PERC CHOLECYSTOSTOMY  05/11/2019   IR RADIOLOGIST EVAL & MGMT  06/30/2019   LEFT HEART CATH AND CORONARY ANGIOGRAPHY N/A 09/29/2017   Procedure: LEFT HEART CATH AND CORONARY ANGIOGRAPHY;  Surgeon: Runell Gess, MD;  Location: [facility] INVASIVE CV LAB;  Service: Cardiovascular;  Laterality: N/A;   LEFT HEART CATHETERIZATION WITH CORONARY ANGIOGRAM N/A 03/23/2015   Procedure: LEFT HEART CATHETERIZATION WITH CORONARY ANGIOGRAM;  Surgeon: Runell Gess, MD;  Location: [facility] CATH LAB;  Service: Cardiovascular;  Laterality: N/A;   REFRACTIVE SURGERY Bilateral 08/2014-09/2014   Diabetic retinopathy    REMOVAL OF STONES  08/11/2019   Procedure: REMOVAL OF STONES;  Surgeon: Meridee Score Netty Starring., MD;  Location: [facility] ENDOSCOPY;  Service: Gastroenterology;;   Gaspar Bidding DILATION N/A 08/11/2019   Procedure: Gaspar Bidding DILATION;  Surgeon: Lemar Lofty., MD;  Location: [facility] ENDOSCOPY;  Service: Gastroenterology;  Laterality: N/A;   SPHINCTEROTOMY  08/11/2019   Procedure: SPHINCTEROTOMY;  Surgeon: Mansouraty, Netty Starring., MD;  Location: [facility] ENDOSCOPY;  Service: Gastroenterology;;    Current Medications: No outpatient medications have  been marked as taking for the 09/24/21 encounter (Appointment) with Baldo Daub, MD.     Allergies:   Metformin   Social History   Socioeconomic History   Marital status: Married    Spouse name: Not on file   Number of children: 5   Years of education: 12th   Highest education level: Not on file  Occupational History   Occupation: Disabled  Tobacco Use   Smoking status: Never   Smokeless tobacco: Never  Vaping Use   Vaping Use: Never used  Substance and Sexual Activity   Alcohol use: No    Alcohol/week: 0.0 standard drinks   Drug use: No   Sexual activity: Not Currently    Partners: Female  Other Topics Concern   Not on file  Social History Narrative   Patient is married with 5 children.    Patient is right handed.   Patient has hs education.   Patient drinks 4 12 oz can sodas daily.   Social Determinants of Health   Financial Resource Strain: Not on file  Food Insecurity: Not on file  Transportation Needs: Not on file  Physical Activity: Not on file  Stress: Not on file  Social Connections: Not on file     Family History: The patient's ***family history includes Asthma in his brother; Cancer in his father; Colon cancer in his brother and sister; Diabetes in his brother, daughter, maternal grandfather, and mother; Gout in his brother and maternal uncle; Heart attack in his father; Heart disease in his father and mother; Hyperlipidemia in his brother, mother, and sister; Hypertension in his brother, mother, and sister; Stroke in his paternal grandfather. ROS:   Please see the history of present illness.    All other systems reviewed and are negative.  EKGs/Labs/Other Studies Reviewed:    The following studies were reviewed today:  EKG:  EKG ordered today and personally reviewed.  The ekg ordered today demonstrates ***  Recent Labs: 03/14/2021: BUN 47; Creatinine, Ser 1.44; Hemoglobin 11.6; Magnesium 2.0; Platelets 530; Potassium 3.6; Sodium 137  Recent Lipid  Panel    Component Value Date/Time   CHOL 146 08/27/2017 1039   TRIG 119 08/27/2017 1039   HDL 34 (L) 08/27/2017  1039   CHOLHDL 4.3 08/27/2017 1039   CHOLHDL 3.9 03/22/2015 1136   VLDL 44 (H) 03/22/2015 1136   LDLCALC 88 08/27/2017 1039    Physical Exam:    VS:  There were no vitals taken for this visit.    Wt Readings from Last 3 Encounters:  03/14/21 245 lb (111.1 kg)  12/13/20 245 lb (111.1 kg)  11/08/20 240 lb (108.9 kg)     GEN: *** Well nourished, well developed in no acute distress HEENT: Normal NECK: No JVD; No carotid bruits LYMPHATICS: No lymphadenopathy CARDIAC: ***RRR, no murmurs, rubs, gallops RESPIRATORY:  Clear to auscultation without rales, wheezing or rhonchi  ABDOMEN: Soft, non-tender, non-distended MUSCULOSKELETAL:  No edema; No deformity  SKIN: Warm and dry NEUROLOGIC:  Alert and oriented x 3 PSYCHIATRIC:  Normal affect    Signed, Norman Herrlich, MD  09/23/2021 10:28 AM    [facility]

## 2021-09-24 ENCOUNTER — Ambulatory Visit: Payer: Medicare HMO | Admitting: Cardiology

## 2021-09-24 NOTE — Progress Notes (Signed)
Cardiology Office Note:    Date:  09/25/2021   ID:  Johnathan Keith, DOB 06-17-1961, MRN [id number]  PCP:  Olive Bass, MD  Cardiologist:  Norman Herrlich, MD    Referring MD: Olive Bass, MD    ASSESSMENT:    1. Hypertensive heart disease with heart failure (HCC)   2. CAD -S/P PCI-2016   3. Low serum potassium level    PLAN:    In order of problems listed above:  His care is now through stay well he has a history of heart failure he has edema and I think he needs to start back on at least 20 mg of torsemide daily and with previous problematic hypokalemia check renal profile in 1 week and for now continue his current potassium supplement. Stable CAD he is on appropriate medical therapy including his chronic dual antiplatelet beta-blocker and high intensity statin I would continue the same and I would not advise an ischemic evaluation at this time especially in view of his comorbidities Recheck renal function and weight continue current potassium supplement   Next appointment: 6 months   Medication Adjustments/Labs and Tests Ordered: Current medicines are reviewed at length with the patient today.  Concerns regarding medicines are outlined above.  No orders of the defined types were placed in this encounter.  Meds ordered this encounter  Medications   torsemide (DEMADEX) 20 MG tablet    Sig: Take 1 tablet (20 mg total) by mouth daily.    Dispense:  90 tablet    Refill:  3     Chief Complaint  Patient presents with   Follow-up   Coronary Artery Disease   Congestive Heart Failure     History of Present Illness:    Johnathan Keith is a 60 y.o. male with a hx of CAD hypertensive heart disease with heart failure ejection fraction 40 to 45% hyperlipidemia diabetes carotid disease with a right carotid endarterectomy and stroke in June 2020 last seen 08/04/2020.Coronary angiography was performed 09/29/2017 showed an 80% mid LAD stenosis with PCI and previous stent  drug-eluting stent 2016 proximal mid LAD patent.    He was admitted to [facility] 06/18/2022 with Altered mental status Laboratory studies showed a hemoglobin of 8.0 creatinine 0.7 potassium 3.9 sodium 137 chest x-ray showed low lung volume diagnosis was metabolic encephalopathy Chart review shows that he is now on the stay well program and had another admission at [facility] in September with encephalopathy and prolonged appointments  Compliance with diet, lifestyle and medications: He is now on a stable program  His daughter is present she noticed that since he is off torsemide he has edema 1+ legs as well as his hands he has a history of heart failure and like him to restart torsemide 20 daily His predominant complaint is chronic diarrhea and wanting to have his feeding tube removed Is not having shortness of breath chest pain palpitation or syncope. Past Medical History:  Diagnosis Date   Abdominal distention    Abnormal CT scan 12/09/2018   Abnormal nuclear cardiac imaging test    High-risk nuclear stress test    Acute febrile illness 05/08/2019   Acute pancreatitis 05/08/2019   Adjustment disorder with mixed anxiety and depressed mood 07/27/2020   AKI (acute kidney injury) (HCC) 03/26/2020   Arthritis    Arthropathy of cervical facet joint 01/31/2019   Bicuspid aortic valve 03/27/2020   Formatting of this note might be different from the original. 2020: ECHO  Bilateral low back pain without sciatica 09/13/2015   Bilateral lower extremity edema 08/18/2018   Bilateral lower extremity edema  Formatting of this note might be different from the original. Bilateral lower extremity edema  Last Assessment & Plan:  He does have 1-2+ pitting edema.  I am going to increase his Lasix to 80 mg a day, and will check a basic metabolic panel in 7 to 10 days.  He will see Baxter Hire back in 1 month for follow-up of lab work.   BPH with obstruction/lower urinary tract symptoms 02/20/2016    Bradycardia 04/07/2020   CAD -S/P PCI-2016 11/30/2015   LAD DES 2016. Admitted to [facility] Sept 2017 with SOB, not felt to be in CHF, Myoview was negative for sichemia  Formatting of this note might be different from the original. Overview:  LAD DES 2016. Admitted to [facility] Sept 2017 with SOB, not felt to be in CHF, Myoview was negative for sichemia  Last Assessment & Plan:  History of CAD status post LAD stenting by myself in 2016 with re-intervention because o   Carotid artery stenosis- 09/26/2014   Rt CEA Oct 2015-Dr Chen   Carotid stenosis 04/07/2013   Rt CEA Oct 2015-Dr Chen  Last Assessment & Plan:  History of carotid artery disease status post right carotid endarterectomy by Dr. [name] October 2015 which he follows by duplex ultrasound. Formatting of this note might be different from the original. Rt CEA Oct 2015-Dr [name]  Last Assessment & Plan:  History of carotid artery disease status post right carotid endarterectomy by Dr. [name] October 2015   Carpal tunnel syndrome 07/17/2015   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HCC) 09/13/2015   Cerebrovascular accident (CVA) due to thrombosis of right middle cerebral artery (HCC) 07/04/2014   Questionable small stroke in 2014 with transient Lt sided weakness in setting of carotid disease.  Formatting of this note might be different from the original. Overview:  Questionable small stroke in 2014 with transient Lt sided weakness in setting of carotid disease.  Last Assessment & Plan:  History of multiple strokes in the past dating back to 2014 with a recent stroke in April of this year a   Cervical spine disease 04/07/2013   Cervical spondylosis without myelopathy 01/31/2019   Cholecystitis 06/24/2019   Cholelithiases 12/09/2018   Chronic diarrhea 12/09/2018   Chronic hypoxemic respiratory failure (HCC) 06/19/2020   Chronic neck and back pain    Chronic pain disorder 01/31/2019   Clostridium difficile diarrhea    negative culture 08/17/19    Community acquired pneumonia 06/19/2020   Formatting of this note might be different from the original. 2021: hosp   Coronary artery disease    drug-eluting stents placed in proximal and mid LAD   Coronary artery disease with history of myocardial infarction without history of CABG 04/07/2013   DDD (degenerative disc disease), cervical 01/31/2019   Degenerative joint disease of thoracic spine 12/09/2018   Deviated septum    Diabetes mellitus type 2, uncontrolled (HCC) 04/07/2013   Diabetic polyneuropathy associated with type 2 diabetes mellitus (HCC) 01/31/2019   Diabetic retinopathy (HCC)    Dyslipidemia 12/09/2018   Dyspnea on exertion 03/26/2020   Dyspnea on exertion  Formatting of this note might be different from the original. Overview:  Dyspnea on exertion  Last Assessment & Plan:  Improved after stenting of his LAD   Elevated troponin 12/09/2018   Enteritis 11/30/2020   Formatting of this note might be different from  the original. 11/30/2020:   Erectile dysfunction 11/25/2015   Essential hypertension 04/07/2013   hypertension  Formatting of this note might be different from the original. hypertension  Last Assessment & Plan:  History of essential hypertension her blood pressure measured at 162/70.  He is on benazepril 10 the morning and 20 in the evening as well as hydralazine, hydrochlorothiazide, and metoprolol.  Blood pressures have been on the high side.  I am going to increase his benazepril to 20 mg    GERD (gastroesophageal reflux disease)    "occasionally" (03/23/2015)   Headache    "more than 2/wk" (03/23/2015)   Heart attack (HCC)    "in 1997 was told I had signs of a small heart attack that I didn't know I'd had"   High cholesterol    History of cardioembolic cerebrovascular accident (CVA) 12/09/2018   History of gout    "when I was younger"   History of kidney stones    passed   History of stroke 04/20/2018   Hyperlipidemia, unspecified 04/07/2013   hyperlipidemia  Formatting of  this note might be different from the original. IMO routine update IMO routine update hyperlipidemia  Last Assessment & Plan:  History of hyperlipidemia on pravastatin with lipid profile performed by his PCP 03/17/2018 revealing total cholesterol 126, LDL 55 HDL 24. Formatting of this note might be different from the original. hyperlipidemia  Last Assessment & Plan:  Hi   Hypertension    Hypogonadism male 01/11/2016   Kidney disease    Large liver 12/09/2018   Lower back pain 06/14/2015   Lumbar facet joint pain 06/14/2015   Lumbar foraminal stenosis 01/31/2019   Lymphocytosis 05/24/2015   Memory loss 03/15/2020   Migraine    "had my 1st and only in 12/2014" (03/23/2015)   Morbid obesity (HCC) 11/08/2020   Neural foraminal stenosis of cervical spine 01/31/2019   Neutrophilia 05/24/2015   Non morbid obesity due to excess calories 02/12/2016   Occipital neuralgia of right side 12/18/2016   Occlusion and stenosis of unspecified carotid artery 04/07/2013   Formatting of this note might be different from the original. Overview:  Rt CEA Oct 2015-Dr Chen  Last Assessment & Plan:  History of carotid artery disease status post right carotid endarterectomy by Dr. [name] October 2015 which he follows by duplex ultrasound.   Peripheral neuropathy    feet   Peripheral vascular disease (HCC)    carotid artery disease   Pneumonia 11/19,1/20   PONV (postoperative nausea and vomiting) 2015   "was told I was confused and combative in recovery from the narcotics w/my carotid OR"  "During a colonoscopy my oxygen level dropped so low that I had to be woke up.- 03/2017 colonoscopy [facility]    Sepsis [facility]) 05/08/2019   Serum total bilirubin elevated 05/08/2019   Sleep apnea    "severe; couldn't afford mask" (03/23/2015) uses O2 at night.no C-pap   SOB (shortness of breath) 05/04/2019   Spondylosis of lumbar spine 01/31/2019   Stroke [facility]) 2014   "on walker for 2 months; left side is weaker and numb since" (03/23/2015), "2  confirmed strokes"   Stroke (HCC) 05/10/2019   Aphasia mild   Stroke, lacunar (HCC) 04/07/2013   Transaminitis 05/08/2019   Type 2 diabetes mellitus with diabetic neuropathy (HCC) 12/09/2018   Type 2 diabetes mellitus with diabetic neuropathy, with long-term current use of insulin (HCC) 07/04/2014   Type 2 diabetes mellitus, with long-term current use of insulin (HCC) 02/12/2016  Type II diabetes mellitus (HCC)     Past Surgical History:  Procedure Laterality Date   BILIARY DILATION  08/11/2019   Procedure: BILIARY DILATION;  Surgeon: Meridee Score Netty Starring., MD;  Location: [facility] ENDOSCOPY;  Service: Gastroenterology;;   BIOPSY  08/11/2019   Procedure: BIOPSY;  Surgeon: Lemar Lofty., MD;  Location: [facility] ENDOSCOPY;  Service: Gastroenterology;;   CHOLECYSTECTOMY N/A 08/31/2019   Procedure: LAPAROSCOPIC CHOLECYSTECTOMY;  Surgeon: Gaynelle Adu, MD;  Location: [facility] ORS;  Service: General;  Laterality: N/A;   CIRCUMCISION  1981   COLONOSCOPY W/ POLYPECTOMY     CORONARY ANGIOPLASTY WITH STENT PLACEMENT  03/23/2015   "2"   CORONARY STENT INTERVENTION N/A 09/29/2017   Procedure: CORONARY STENT INTERVENTION;  Surgeon: Runell Gess, MD;  Location: [facility] INVASIVE CV LAB;  Service: Cardiovascular;  Laterality: N/A;   ENDARTERECTOMY Right 09/26/2014   Procedure: RIGHT CAROTID ENDARTERECTOMY WITH PATCH ANGIOPLASTY;  Surgeon: Fransisco Hertz, MD;  Location: [facility] OR;  Service: Vascular;  Laterality: Right;   ENDOSCOPIC RETROGRADE CHOLANGIOPANCREATOGRAPHY (ERCP) WITH PROPOFOL N/A 08/11/2019   Procedure: ENDOSCOPIC RETROGRADE CHOLANGIOPANCREATOGRAPHY (ERCP) WITH PROPOFOL;  Surgeon: Lemar Lofty., MD;  Location: [facility] ENDOSCOPY;  Service: Gastroenterology;  Laterality: N/A;   ESOPHAGOGASTRODUODENOSCOPY (EGD) WITH PROPOFOL N/A 08/11/2019   Procedure: ESOPHAGOGASTRODUODENOSCOPY (EGD) WITH PROPOFOL;  Surgeon: Meridee Score Netty Starring., MD;  Location: [facility] ENDOSCOPY;  Service: Gastroenterology;  Laterality: N/A;   EYE  SURGERY Right May 2016   Cataract   EYE SURGERY Left July 2016   Cataract   HYDROCELE EXCISION  ~ 2008   IR EXCHANGE BILIARY DRAIN  07/21/2019   IR PERC CHOLECYSTOSTOMY  05/11/2019   IR RADIOLOGIST EVAL & MGMT  06/30/2019   LEFT HEART CATH AND CORONARY ANGIOGRAPHY N/A 09/29/2017   Procedure: LEFT HEART CATH AND CORONARY ANGIOGRAPHY;  Surgeon: Runell Gess, MD;  Location: [facility] INVASIVE CV LAB;  Service: Cardiovascular;  Laterality: N/A;   LEFT HEART CATHETERIZATION WITH CORONARY ANGIOGRAM N/A 03/23/2015   Procedure: LEFT HEART CATHETERIZATION WITH CORONARY ANGIOGRAM;  Surgeon: Runell Gess, MD;  Location: [facility] CATH LAB;  Service: Cardiovascular;  Laterality: N/A;   REFRACTIVE SURGERY Bilateral 08/2014-09/2014   Diabetic retinopathy    REMOVAL OF STONES  08/11/2019   Procedure: REMOVAL OF STONES;  Surgeon: Meridee Score Netty Starring., MD;  Location: [facility] ENDOSCOPY;  Service: Gastroenterology;;   Gaspar Bidding DILATION N/A 08/11/2019   Procedure: Gaspar Bidding DILATION;  Surgeon: Lemar Lofty., MD;  Location: [facility] ENDOSCOPY;  Service: Gastroenterology;  Laterality: N/A;   SPHINCTEROTOMY  08/11/2019   Procedure: SPHINCTEROTOMY;  Surgeon: Mansouraty, Netty Starring., MD;  Location: [facility] ENDOSCOPY;  Service: Gastroenterology;;    Current Medications: Current Meds  Medication Sig   allopurinol (ZYLOPRIM) 100 MG tablet Take 100 mg by mouth daily.   aspirin 81 MG tablet Take 1 tablet (81 mg total) by mouth daily.   atorvastatin (LIPITOR) 80 MG tablet Take 80 mg by mouth daily.   carbidopa-levodopa (SINEMET IR) 25-100 MG tablet Take 0.5 tablets by mouth 2 (two) times daily. Parkinson's Disease   ferrous sulfate 325 (65 FE) MG EC tablet Take 1 tablet by mouth daily.   torsemide (DEMADEX) 20 MG tablet Take 1 tablet (20 mg total) by mouth daily.     Allergies:   Metformin   Social History   Socioeconomic History   Marital status: Married    Spouse name: Not on file   Number of children: 5   Years of education:  12th   Highest education level: Not on  file  Occupational History   Occupation: Disabled  Tobacco Use   Smoking status: Never   Smokeless tobacco: Never  Vaping Use   Vaping Use: Never used  Substance and Sexual Activity   Alcohol use: No    Alcohol/week: 0.0 standard drinks   Drug use: No   Sexual activity: Not Currently    Partners: Female  Other Topics Concern   Not on file  Social History Narrative   Patient is married with 5 children.    Patient is right handed.   Patient has hs education.   Patient drinks 4 12 oz can sodas daily.   Social Determinants of Health   Financial Resource Strain: Not on file  Food Insecurity: Not on file  Transportation Needs: Not on file  Physical Activity: Not on file  Stress: Not on file  Social Connections: Not on file     Family History: The patient's family history includes Asthma in his brother; Cancer in his father; Colon cancer in his brother and sister; Diabetes in his brother, daughter, maternal grandfather, and mother; Gout in his brother and maternal uncle; Heart attack in his father; Heart disease in his father and mother; Hyperlipidemia in his brother, mother, and sister; Hypertension in his brother, mother, and sister; Stroke in his paternal grandfather. ROS:   Please see the history of present illness.    All other systems reviewed and are negative.  EKGs/Labs/Other Studies Reviewed:    The following studies were reviewed today:    Recent Labs: 03/14/2021: BUN 47; Creatinine, Ser 1.44; Hemoglobin 11.6; Magnesium 2.0; Platelets 530; Potassium 3.6; Sodium 137  Recent Lipid Panel    Component Value Date/Time   CHOL 146 08/27/2017 1039   TRIG 119 08/27/2017 1039   HDL 34 (L) 08/27/2017 1039   CHOLHDL 4.3 08/27/2017 1039   CHOLHDL 3.9 03/22/2015 1136   VLDL 44 (H) 03/22/2015 1136   LDLCALC 88 08/27/2017 1039    Physical Exam:    VS:  BP 104/60 (BP Location: Left Arm, Patient Position: Sitting)   Pulse 62   Ht 5'  5" (1.651 m)   SpO2 99%   BMI 40.77 kg/m     Wt Readings from Last 3 Encounters:  03/14/21 245 lb (111.1 kg)  12/13/20 245 lb (111.1 kg)  11/08/20 240 lb (108.9 kg)     GEN: Appears older than his age well nourished, well developed in no acute distress HEENT: Normal NECK: No JVD; No carotid bruits LYMPHATICS: No lymphadenopathy CARDIAC: RRR, no murmurs, rubs, gallops RESPIRATORY:  Clear to auscultation without rales, wheezing or rhonchi  ABDOMEN: Soft, non-tender, non-distended MUSCULOSKELETAL: 1-2+ bilateral lower extremity pitting edema; No deformity he also has edema of the hands bilaterally SKIN: Warm and dry NEUROLOGIC:  Alert and oriented x 3 PSYCHIATRIC:  Normal affect    Signed, Norman Herrlich, MD  09/25/2021 10:48 AM    [facility]

## 2021-09-25 ENCOUNTER — Encounter: Payer: Self-pay | Admitting: Cardiology

## 2021-09-25 ENCOUNTER — Ambulatory Visit (INDEPENDENT_AMBULATORY_CARE_PROVIDER_SITE_OTHER): Payer: No Typology Code available for payment source | Admitting: Cardiology

## 2021-09-25 ENCOUNTER — Other Ambulatory Visit: Payer: Self-pay

## 2021-09-25 VITALS — BP 104/60 | HR 62 | Ht 65.0 in

## 2021-09-25 DIAGNOSIS — I110 Hypertensive heart disease with heart failure: Secondary | ICD-10-CM

## 2021-09-25 DIAGNOSIS — I2510 Atherosclerotic heart disease of native coronary artery without angina pectoris: Secondary | ICD-10-CM

## 2021-09-25 DIAGNOSIS — Z9861 Coronary angioplasty status: Secondary | ICD-10-CM

## 2021-09-25 DIAGNOSIS — E876 Hypokalemia: Secondary | ICD-10-CM | POA: Diagnosis not present

## 2021-09-25 MED ORDER — TORSEMIDE 20 MG PO TABS: 20.0000 mg | ORAL_TABLET | Freq: Every day | ORAL | 3 refills | Status: DC

## 2021-09-25 NOTE — Patient Instructions (Signed)
Medication Instructions:  Your physician has recommended you make the following change in your medication:  RESTART: Torsemide 20 mg take one tablet by mouth daily.  *If you need a refill on your cardiac medications before your next appointment, please call your pharmacy*   Lab Work: Your physician recommends that you return for lab work in: 1 week BMP If you have labs (blood work) drawn today and your tests are completely normal, you will receive your results only by: MyChart Message (if you have MyChart) OR A paper copy in the mail If you have any lab test that is abnormal or we need to change your treatment, we will call you to review the results.   Testing/Procedures: None   Follow-Up: At [facility], you and your health needs are our priority.  As part of our continuing mission to provide you with exceptional heart care, we have created designated Provider Care Teams.  These Care Teams include your primary Cardiologist (physician) and Advanced Practice Providers (APPs -  Physician Assistants and Nurse Practitioners) who all work together to provide you with the care you need, when you need it.  We recommend signing up for the patient portal called "MyChart".  Sign up information is provided on this After Visit Summary.  MyChart is used to connect with patients for Virtual Visits (Telemedicine).  Patients are able to view lab/test results, encounter notes, upcoming appointments, etc.  Non-urgent messages can be sent to your provider as well.   To learn more about what you can do with MyChart, go to [email or web address].    Your next appointment:   6 month(s)  The format for your next appointment:   In Person  Provider:   Norman Herrlich, MD   Other Instructions

## 2021-11-13 ENCOUNTER — Encounter: Payer: Self-pay | Admitting: Sports Medicine

## 2021-11-13 ENCOUNTER — Ambulatory Visit (INDEPENDENT_AMBULATORY_CARE_PROVIDER_SITE_OTHER): Payer: No Typology Code available for payment source | Admitting: Sports Medicine

## 2021-11-13 DIAGNOSIS — I739 Peripheral vascular disease, unspecified: Secondary | ICD-10-CM

## 2021-11-13 DIAGNOSIS — M79676 Pain in unspecified toe(s): Secondary | ICD-10-CM | POA: Diagnosis not present

## 2021-11-13 DIAGNOSIS — Z872 Personal history of diseases of the skin and subcutaneous tissue: Secondary | ICD-10-CM | POA: Diagnosis not present

## 2021-11-13 DIAGNOSIS — L89610 Pressure ulcer of right heel, unstageable: Secondary | ICD-10-CM

## 2021-11-13 DIAGNOSIS — E1142 Type 2 diabetes mellitus with diabetic polyneuropathy: Secondary | ICD-10-CM

## 2021-11-13 MED ORDER — IODOSORB 0.9 % EX GEL: 1.0000 "application " | Freq: Every day | CUTANEOUS | 0 refills | Status: DC | PRN

## 2021-11-13 MED ORDER — NEOMYCIN-POLYMYXIN-HC 3.5-10000-1 OT SOLN: OTIC | 1 refills | Status: DC

## 2021-11-13 NOTE — Progress Notes (Signed)
Subjective: Johnathan Keith is a 60 y.o. male patient seen in office for evaluation of ulceration of the right heel chronic since July states that it would heal and then come back open and states that he is currently at stay well and his open back up and did drain some back in October but now has scabbed back over.  Patient also admits to a history of ingrown nails with the nails on the right foot being most painful and is assisted by daughter who shows me pictures of what his toenails look like a few months ago daughter reports that she kept them clean and antibiotic cream and they have healed but patient is concerned about ingrown's coming back and being painful still.  Patient reports that when he gets infection it messes with his brain.  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) 10/19/2020   Peripheral neuropathy 09/18/2020   Migraine 09/18/2020   Diabetic retinopathy (HCC) 09/18/2020   Type II diabetes mellitus (HCC)    Pneumonia    Peripheral vascular disease (HCC)    Deviated septum    Headache    Heart attack (HCC)    High cholesterol    History of gout    History of kidney stones    Adjustment disorder with mixed anxiety and depressed mood 07/27/2020   Chronic hypoxemic respiratory failure (HCC) 06/19/2020   Community acquired pneumonia 06/19/2020   Bradycardia 04/07/2020   Bicuspid aortic valve 03/27/2020   Dyspnea on exertion 03/26/2020   Acute kidney injury (HCC) 03/26/2020   AKI (acute kidney injury) (HCC) 03/26/2020   Memory loss 03/15/2020   Cholecystitis 06/24/2019   Abdominal distention    Acute febrile illness 05/08/2019   Transaminitis  05/08/2019   Serum total bilirubin elevated 05/08/2019   Sepsis (HCC) 05/08/2019   Acute pancreatitis 05/08/2019   SOB (shortness of breath) 05/04/2019   Arthropathy of cervical facet joint 01/31/2019   Neural foraminal stenosis of cervical spine 01/31/2019   Cervical spondylosis without myelopathy 01/31/2019   Chronic pain disorder 01/31/2019   Diabetic polyneuropathy associated with type 2 diabetes mellitus (HCC) 01/31/2019   DDD (degenerative disc disease), cervical 01/31/2019   Spondylosis of lumbar spine 01/31/2019   Lumbar foraminal stenosis 01/31/2019   Abnormal CT scan 12/09/2018   Cholelithiases 12/09/2018   Chronic diarrhea 12/09/2018   Degenerative joint disease of thoracic spine 12/09/2018   Dyslipidemia 12/09/2018   History of cardioembolic cerebrovascular accident (CVA) 12/09/2018   Large liver 12/09/2018   Type 2 diabetes mellitus with diabetic neuropathy (HCC) 12/09/2018   Elevated troponin 12/09/2018   Bilateral lower extremity edema 08/18/2018   History of stroke 04/20/2018   Sleep apnea 02/27/2017   Occipital neuralgia of right side 12/18/2016   BPH with obstruction/lower urinary tract symptoms 02/20/2016   Non morbid obesity due to excess calories 02/12/2016   Type 2 diabetes mellitus, with long-term current use of insulin (HCC) 02/12/2016   Hypogonadism male 01/11/2016   CAD -S/P PCI-2016 11/30/2015   Erectile dysfunction 11/25/2015   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HCC) 09/13/2015   Bilateral low back pain without  sciatica 09/13/2015   Carpal tunnel syndrome 07/17/2015   Lower back pain 06/14/2015   Lumbar facet joint pain 06/14/2015   Lymphocytosis 05/24/2015   Neutrophilia 05/24/2015   Abnormal nuclear cardiac imaging test    Carotid artery stenosis- 09/26/2014   Cerebrovascular accident (CVA) due to thrombosis of right middle cerebral artery (HCC) 07/04/2014   Type 2 diabetes mellitus with diabetic neuropathy, with  long-term current use of insulin (HCC) 07/04/2014   History of CVA (cerebrovascular accident) 07/04/2014   PONV (postoperative nausea and vomiting) 2015   Hyperlipidemia, unspecified 04/07/2013   Essential hypertension 04/07/2013   Carotid stenosis 04/07/2013   Cervical spine disease 04/07/2013   Stroke, lacunar (HCC) 04/07/2013   Coronary artery disease with history of myocardial infarction without history of CABG 04/07/2013   Diabetes mellitus type 2, uncontrolled 04/07/2013   GERD (gastroesophageal reflux disease) 04/07/2013   Occlusion and stenosis of unspecified carotid artery 04/07/2013   Stroke [facility]) 2014   Current Outpatient Medications on File Prior to Visit  Medication Sig Dispense Refill   allopurinol (ZYLOPRIM) 100 MG tablet Take by mouth.     gabapentin (NEURONTIN) 300 MG capsule Take by mouth.     acetaminophen (TYLENOL) 650 MG CR tablet Take by mouth.     Albuterol Sulfate, sensor, (PROAIR DIGIHALER) 108 (90 Base) MCG/ACT AEPB Inhale 1 puff into the lungs 4 (four) times daily as needed (Wheezing / shortness of breath).     allopurinol (ZYLOPRIM) 100 MG tablet Take 100 mg by mouth daily.     amoxicillin-clavulanate (AUGMENTIN) 875-125 MG tablet      aspirin 81 MG tablet Take 1 tablet (81 mg total) by mouth daily. 1 tablet 0   atorvastatin (LIPITOR) 80 MG tablet Take 80 mg by mouth daily.     bacitracin 500 UNIT/GM ointment bacitracin 500 unit/gram topical packet  APPLY OINTMENT EXTERNALLY TWICE DAILY TO WOUNDS     bethanechol (URECHOLINE) 10 MG tablet Take 10 mg by mouth 3 (three) times daily.     carbidopa-levodopa (SINEMET IR) 25-100 MG tablet Take 0.5 tablets by mouth 2 (two) times daily. Parkinson's Disease     cholecalciferol (VITAMIN D3) 25 MCG (1000 UT) tablet Take 1,000 Units by mouth daily.     clopidogrel (PLAVIX) 75 MG tablet Take 1 tablet (75 mg total) by mouth daily. 90 tablet 3   dexamethasone (DECADRON) 4 MG tablet      Dextrose, Diabetic Use, (GLUCOSE)  77.4 % GEL Take by mouth.     dronabinol (MARINOL) 2.5 MG capsule Take 2.5 mg by mouth 2 (two) times daily.     ferrous sulfate 325 (65 FE) MG EC tablet Take 1 tablet by mouth daily.     fluconazole (DIFLUCAN) 150 MG tablet Take 150 mg by mouth daily.     fluticasone (FLOVENT HFA) 110 MCG/ACT inhaler Inhale 2 puffs into the lungs 2 (two) times daily.     fluticasone furoate-vilanterol (BREO ELLIPTA) 200-25 MCG/INH AEPB Inhale 1 puff into the lungs daily.     furosemide (LASIX) 10 MG/ML injection      gabapentin (NEURONTIN) 300 MG capsule Take 300 mg by mouth at bedtime.     insulin glargine-yfgn (SEMGLEE) 100 UNIT/ML Pen Inject 45 Units into the skin 2 (two) times daily.     insulin regular (NOVOLIN R) 100 units/mL injection Inject 55 Units into the skin 2 (two) times daily. Pt is on a sliding scale     ipratropium (ATROVENT HFA) 17 MCG/ACT inhaler Inhale 2 puffs  into the lungs every 4 (four) hours as needed for wheezing.     lipase/protease/amylase (CREON) 36000 UNITS CPEP capsule Take by mouth in the morning and at bedtime.     metoprolol succinate (TOPROL-XL) 50 MG 24 hr tablet Take 1 tablet (50 mg total) by mouth daily. 90 tablet 3   mirtazapine (REMERON) 15 MG tablet Take 15 mg by mouth at bedtime.     montelukast (SINGULAIR) 10 MG tablet Take 10 mg by mouth at bedtime.     Multiple Vitamins-Minerals (PRESERVISION AREDS PO) Take 1 tablet by mouth daily.     pantoprazole (PROTONIX) 40 MG tablet Take 1 tablet (40 mg total) by mouth daily. 90 tablet 3   potassium chloride SA (KLOR-CON) 20 MEQ tablet Take 20 mEq by mouth in the morning and at bedtime.     Probiotic Product [facility]) CAPS Take 1 capsule by mouth 2 (two) times daily.     spironolactone (ALDACTONE) 25 MG tablet Take 25 mg by mouth daily.     sulfamethoxazole-trimethoprim (BACTRIM DS) 800-160 MG tablet Take 1 tablet by mouth 2 (two) times daily.     tamsulosin (FLOMAX) 0.4 MG CAPS capsule TAKE ONE CAPSULE BY MOUTH TWICE DAILY 30  MINUTES AFTER MEAL     Torsemide 40 MG TABS Take by mouth.     No current facility-administered medications on file prior to visit.   Allergies  Allergen Reactions   Metformin     Other reaction(s): GI Upset (intolerance) Even with XR    No results found for this or any previous visit (from the past 2160 hour(s)).  Objective: There were no vitals filed for this visit.  General: Patient is awake, alert, oriented x 2 and in no acute distress in wheelchair.  Dermatology: Skin is warm and dry bilateral with a eschar noted to the right medial heel.  Measures 1 cm x 1 cm with dry peeling skin surrounding this area. There is no malodor, no active drainage, no erythema, no edema. No acute signs of infection.  There is incurvation noted on the right hallux medial nail border greater than lateral as well as the right second toe lateral greater than medial nail border with thickness noted to the affected nails per patient there is pain to these nails as well as some discomfort occasionally on the left hallux nail but no acute ingrowing or erythema is present at this time.   Vascular: Dorsalis Pedis pulse = 1/4 Bilateral,  Posterior Tibial pulse = 0/4 Bilateral,  Capillary Fill Time < 5 seconds  Neurologic: Protective sensation absent bilateral.  Musculosketal: There is no pain to palpation to the right heel ulcer.  Subjective pain to palpation to the right first and second toenails greater than the left great toenail.   No results for input(s): GRAMSTAIN, LABORGA in the last 8760 hours.  Assessment and Plan:  Problem List Items Addressed This Visit   None Visit Diagnoses     Pressure ulcer of right heel, unstageable (HCC)    -  Primary   Relevant Medications   cadexomer iodine (IODOSORB) 0.9 % gel   Pain around toenail       Relevant Medications   neomycin-polymyxin-hydrocortisone (CORTISPORIN) OTIC solution   History of ingrown nail       PVD (peripheral vascular disease) (HCC)        Relevant Medications   furosemide (LASIX) 10 MG/ML injection   Torsemide 40 MG TABS   Other Relevant Orders   VAS [place] LOWER EXTREMITY  ARTERIAL DUPLEX   Diabetic peripheral neuropathy associated with type 2 diabetes mellitus (HCC)       Relevant Medications   Dextrose, Diabetic Use, (GLUCOSE) 77.4 % GEL   gabapentin (NEURONTIN) 300 MG capsule        -Examined patient and discussed the progression of the wound and treatment alternatives. -Recommend patient to continue with Medihoney once he has finished this prescription that he has at home may start using Iodosorb as prescribed to the right heel -No debridement was done today due to uncertain vascular status -Applied Iodosorb and Mepilex heel border dressing -Advised patient to continue with heel offloading boots or elevation of heel to event rubbing or excessive pressure when sleeping or in bed at night - Advised patient that we will order vascular testing to see if we can understand why he still keeps having this wound recur; stay well to arrange appointment order was sent to [facility] for testing -Mechanically debrided right first and second toenails removing offending border with sterile nail nipper applied antibiotic cream and Band-Aid to any small areas of bleeding, advised patient at this time that he is not a candidate for a ingrown nail avulsion procedure because of his uncertain vascular status especially on the right side where he has failed to heal his heel wound that reoccurs -Patient to return to office within 1 month for follow up care and evaluation or sooner if problems arise.  Office to contact they will for additional authorization and approval for follow-up visit  Asencion Islam, DPM

## 2021-11-21 NOTE — Addendum Note (Signed)
Addended by: Hadley Pen R on: 11/21/2021 04:15 PM   Modules accepted: Orders

## 2021-11-28 DIAGNOSIS — L97419 Non-pressure chronic ulcer of right heel and midfoot with unspecified severity: Secondary | ICD-10-CM

## 2021-12-18 ENCOUNTER — Other Ambulatory Visit: Payer: Self-pay

## 2021-12-18 ENCOUNTER — Encounter: Payer: Self-pay | Admitting: Sports Medicine

## 2021-12-18 ENCOUNTER — Ambulatory Visit (INDEPENDENT_AMBULATORY_CARE_PROVIDER_SITE_OTHER): Payer: No Typology Code available for payment source | Admitting: Sports Medicine

## 2021-12-18 VITALS — Temp 97.2°F

## 2021-12-18 DIAGNOSIS — L89610 Pressure ulcer of right heel, unstageable: Secondary | ICD-10-CM

## 2021-12-18 DIAGNOSIS — E1142 Type 2 diabetes mellitus with diabetic polyneuropathy: Secondary | ICD-10-CM | POA: Diagnosis not present

## 2021-12-18 DIAGNOSIS — I739 Peripheral vascular disease, unspecified: Secondary | ICD-10-CM

## 2021-12-18 DIAGNOSIS — Z872 Personal history of diseases of the skin and subcutaneous tissue: Secondary | ICD-10-CM

## 2021-12-18 NOTE — Progress Notes (Signed)
Subjective: Johnathan Keith is a 61 y.o. male patient seen in office for follow-up evaluation of right heel ulcer.  Patient reports that the wound is about the same states that on yesterday the nurse trimmed off a piece of loose black skin to the area.  Patient denies nausea vomiting fever chills or any other constitutional symptoms at this time.  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) 10/19/2020   Peripheral neuropathy 09/18/2020   Migraine 09/18/2020   Diabetic retinopathy (HCC) 09/18/2020   Type II diabetes mellitus (HCC)    Pneumonia    Peripheral vascular disease (HCC)    Deviated septum    Headache    Heart attack (HCC)    High cholesterol    History of gout    History of kidney stones    Adjustment disorder with mixed anxiety and depressed mood 07/27/2020   Chronic hypoxemic respiratory failure (HCC) 06/19/2020   Community acquired pneumonia 06/19/2020   Bradycardia 04/07/2020   Bicuspid aortic valve 03/27/2020   Dyspnea on exertion 03/26/2020   Acute kidney injury (HCC) 03/26/2020   AKI (acute kidney injury) (HCC) 03/26/2020   Memory loss 03/15/2020   Cholecystitis 06/24/2019   Abdominal distention    Acute febrile illness 05/08/2019   Transaminitis 05/08/2019   Serum total bilirubin elevated 05/08/2019   Sepsis (HCC) 05/08/2019   Acute pancreatitis 05/08/2019   SOB (shortness of breath) 05/04/2019   Arthropathy of cervical facet joint 01/31/2019   Neural foraminal stenosis of cervical spine 01/31/2019   Cervical spondylosis  without myelopathy 01/31/2019   Chronic pain disorder 01/31/2019   Diabetic polyneuropathy associated with type 2 diabetes mellitus (HCC) 01/31/2019   DDD (degenerative disc disease), cervical 01/31/2019   Spondylosis of lumbar spine 01/31/2019   Lumbar foraminal stenosis 01/31/2019   Abnormal CT scan 12/09/2018   Cholelithiases 12/09/2018   Chronic diarrhea 12/09/2018   Degenerative joint disease of thoracic spine 12/09/2018   Dyslipidemia 12/09/2018   History of cardioembolic cerebrovascular accident (CVA) 12/09/2018   Large liver 12/09/2018   Type 2 diabetes mellitus with diabetic neuropathy (HCC) 12/09/2018   Elevated troponin 12/09/2018   Bilateral lower extremity edema 08/18/2018   History of stroke 04/20/2018   Sleep apnea 02/27/2017   Occipital neuralgia of right side 12/18/2016   BPH with obstruction/lower urinary tract symptoms 02/20/2016   Non morbid obesity due to excess calories 02/12/2016   Type 2 diabetes mellitus, with long-term current use of insulin (HCC) 02/12/2016   Hypogonadism male 01/11/2016   CAD -S/P PCI-2016 11/30/2015   Erectile dysfunction 11/25/2015   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HCC) 09/13/2015   Bilateral low back pain without sciatica 09/13/2015   Carpal tunnel syndrome 07/17/2015   Lower back pain 06/14/2015   Lumbar facet joint pain 06/14/2015   Lymphocytosis 05/24/2015   Neutrophilia 05/24/2015   Abnormal nuclear cardiac imaging test    Carotid artery stenosis- 09/26/2014   Cerebrovascular accident (CVA) due to thrombosis of right middle cerebral artery (HCC) 07/04/2014  Type 2 diabetes mellitus with diabetic neuropathy, with long-term current use of insulin (HCC) 07/04/2014   History of CVA (cerebrovascular accident) 07/04/2014   PONV (postoperative nausea and vomiting) 2015   Hyperlipidemia, unspecified 04/07/2013   Essential hypertension 04/07/2013   Carotid stenosis 04/07/2013   Cervical spine  disease 04/07/2013   Stroke, lacunar (HCC) 04/07/2013   Coronary artery disease with history of myocardial infarction without history of CABG 04/07/2013   Diabetes mellitus type 2, uncontrolled 04/07/2013   GERD (gastroesophageal reflux disease) 04/07/2013   Occlusion and stenosis of unspecified carotid artery 04/07/2013   Stroke [facility]) 2014   Current Outpatient Medications on File Prior to Visit  Medication Sig Dispense Refill   allopurinol (ZYLOPRIM) 100 MG tablet Take by mouth.     atorvastatin (LIPITOR) 80 MG tablet Take 80 mg by mouth daily.     bethanechol (URECHOLINE) 10 MG tablet Take 10 mg by mouth 3 (three) times daily.     cadexomer iodine (IODOSORB) 0.9 % gel Apply 1 application topically daily as needed for wound care. 40 g 0   carbidopa-levodopa (SINEMET IR) 25-100 MG tablet Take 0.5 tablets by mouth 2 (two) times daily. Parkinson's Disease     Cholecalciferol (VITAMIN D3 PO) Take by mouth.     clopidogrel (PLAVIX) 75 MG tablet Take 1 tablet (75 mg total) by mouth daily. 90 tablet 3   Ferrous Sulfate (FEROSUL PO) Take by mouth.     gabapentin (NEURONTIN) 300 MG capsule Take by mouth.     metoprolol succinate (TOPROL-XL) 50 MG 24 hr tablet Take 1 tablet (50 mg total) by mouth daily. 90 tablet 3   mirtazapine (REMERON) 15 MG tablet Take 15 mg by mouth at bedtime.     montelukast (SINGULAIR) 10 MG tablet Take 10 mg by mouth at bedtime.     neomycin-polymyxin-hydrocortisone (CORTISPORIN) OTIC solution Apply 1-2 drops to each toenail corners at bedtime for pain and infection at ingrowing nails 10 mL 1   oseltamivir (TAMIFLU) 75 MG capsule Take 75 mg by mouth 2 (two) times daily.     Pancrelipase, Lip-Prot-Amyl, (CREON PO) Take by mouth.     pantoprazole (PROTONIX) 40 MG tablet Take 1 tablet (40 mg total) by mouth daily. 90 tablet 3   potassium chloride SA (KLOR-CON) 20 MEQ tablet Take 20 mEq by mouth in the morning and at bedtime.     spironolactone (ALDACTONE) 25 MG tablet Take 25  mg by mouth daily.     tamsulosin (FLOMAX) 0.4 MG CAPS capsule TAKE ONE CAPSULE BY MOUTH TWICE DAILY 30 MINUTES AFTER MEAL     Torsemide 40 MG TABS Take by mouth.     No current facility-administered medications on file prior to visit.   Allergies  Allergen Reactions   Metformin     Other reaction(s): GI Upset (intolerance) Even with XR    No results found for this or any previous visit (from the past 2160 hour(s)).  Objective: There were no vitals filed for this visit.  General: Patient is awake, alert, oriented x 2 and in no acute distress in wheelchair.  Dermatology: Skin is warm and dry bilateral with a eschar noted to the right medial heel.  Measures significantly larger 5 x 4 with increased area of dried blood/deep tissue pressure injury periwound, there is no bogginess there is no malodor, no active drainage, no erythema, no edema. No other acute signs of infection.  There is no pain to palpation to the right hallux nail at medial  lateral borders no acute ingrowing noted at this visit.  Vascular: Dorsalis Pedis pulse = 1/4 Bilateral,  Posterior Tibial pulse = 0/4 Bilateral,  Capillary Fill Time < 5 seconds  Neurologic: Protective sensation absent bilateral.  Musculosketal: There is no pain to palpation to the right heel ulcer.     No results for input(s): GRAMSTAIN, LABORGA in the last 8760 hours.  Assessment and Plan:  Problem List Items Addressed This Visit   None Visit Diagnoses     Pressure ulcer of right heel, unstageable (HCC)    -  Primary   PVD (peripheral vascular disease) (HCC)       Diabetic peripheral neuropathy associated with type 2 diabetes mellitus (HCC)       History of ingrown nail       Resolved        -Examined patient and discussed the progression of the wound and treatment alternatives -Recommend at this time patient be referred to wound care center for more specialized wound care for the heel patient is high risk for worsening infection and  amputation; recommend referral to [facility] wound care center or [facility] wound care center for further management of this pressure ulcer -Recommend at this time patient be referred to vascular due to his abnormal ABIs which are noncompressible for possible further work-up and intervention -No debridement was done today due to uncertain vascular status -Applied a small amount of Medihoney mixed with Iodosorb and dry dressing -Advised patient to continue with heel offloading boots or elevation of heel to event rubbing or excessive pressure when sleeping or in bed at night and to avoid excessive sitting in 1 position which could be adding to increased pressure to his heel and worsening of the ulcer -Patient to return to office as needed however at this time patient is high risk for infection and amputation and needs more specialized care from wound center and vascular as recommended.  Please assist with getting these referrals placed for the patient.  He may contact my office if you need further recommendations.  Dr. Asencion Islam [facility] [phone] office

## 2022-01-15 ENCOUNTER — Other Ambulatory Visit: Payer: Self-pay

## 2022-01-15 DIAGNOSIS — I6522 Occlusion and stenosis of left carotid artery: Secondary | ICD-10-CM

## 2022-01-15 DIAGNOSIS — I739 Peripheral vascular disease, unspecified: Secondary | ICD-10-CM

## 2022-01-17 NOTE — Progress Notes (Signed)
Office Note      CC:  right heel ulceration Requesting Provider:  Algis Greenhouse, MD  HPI: Johnathan Keith is a 61 y.o. (Feb 04, 1961) male presenting at the request of Dr. Landis Martins, DMP with poorly-healing right heel wound.  Patient has extensive past medical history including multiple strokes -his ultimate nonambulatory status, right eye blindness, s/p right-sided CEA, diabetes, hypertension, bilateral lower extremity edema.  Currently lives on a [place] and wheelchair for mobility.  Johnathan Keith has had a right heel wound for over 4 months.  Per his wife, this is due to having constant pressure on the area.  Johnathan Keith usually lays on his left side causing the inner portion of his heel to have a pressure induced ulcer.  When he sits in a chair, pressure is also in the same area.  When rolled to his right side, his right foot lays against the ulcer on his left heel.  The site waxes and wanes in terms of healing, has not fully healed.  He denies symptoms of fever, chills.  The pt is  on a statin for cholesterol management.  The pt is not on a daily aspirin.   Other AC:  plavix The pt is  on medication for hypertension.   The pt is  diabetic.  Tobacco hx:  former  Past Medical History:  Diagnosis Date   Abdominal distention    Abnormal CT scan 12/09/2018   Abnormal nuclear cardiac imaging test    High-risk nuclear stress test    Acute febrile illness 05/08/2019   Acute pancreatitis 05/08/2019   Adjustment disorder with mixed anxiety and depressed mood 07/27/2020   AKI (acute kidney injury) ([place]) 03/26/2020   Arthritis    Arthropathy of cervical facet joint 01/31/2019   Bicuspid aortic valve 03/27/2020   Formatting of this note might be different from the original. 2020: ECHO   Bilateral low back pain without sciatica 09/13/2015   Bilateral lower extremity edema 08/18/2018   Bilateral lower extremity edema  Formatting of this note might be different from the original. Bilateral lower extremity  edema  Last Assessment & Plan:  He does have 1-2+ pitting edema.  I am going to increase his Lasix to 80 mg a day, and will check a basic metabolic panel in 7 to 10 days.  He will see Cyril Mourning back in 1 month for follow-up of lab work.   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 Admitted to [facility] Sept 2017 with SOB, not felt to be in CHF, Myoview was negative for sichemia  Formatting of this note might be different from the original. Overview:  LAD DES 2016. Admitted to [facility] Sept 2017 with SOB, not felt to be in CHF, Myoview was negative for sichemia  Last Assessment & Plan:  History of CAD status post LAD stenting by myself in 2016 with re-intervention because o   Carotid artery stenosis- 09/26/2014   Rt CEA Oct 2015-Dr Chen   Carotid stenosis 04/07/2013   Rt CEA Oct 2015-Dr Chen  Last Assessment & Plan:  History of carotid artery disease status post right carotid endarterectomy by Dr. Bridgett Larsson October 2015 which he follows by duplex ultrasound. Formatting of this note might be different from the original. Rt CEA Oct 2015-Dr Bridgett Larsson  Last Assessment & Plan:  History of carotid artery disease status post right carotid endarterectomy by Dr. Bridgett Larsson October 2015   Carpal tunnel syndrome  07/17/2015   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[place]) 09/13/2015   Cerebrovascular accident (CVA) due to thrombosis of right middle cerebral artery ([place]) 07/04/2014   Questionable small stroke in 2014 with transient Lt sided weakness in setting of carotid disease.  Formatting of this note might be different from the original. Overview:  Questionable small stroke in 2014 with transient Lt sided weakness in setting of carotid disease.  Last Assessment & Plan:  History of multiple strokes in the past dating back to 2014 with a recent stroke in April of this year a   Cervical spine disease 04/07/2013   Cervical spondylosis  without myelopathy 01/31/2019   Cholecystitis 06/24/2019   Cholelithiases 12/09/2018   Chronic diarrhea 12/09/2018   Chronic hypoxemic respiratory failure ([place]) 06/19/2020   Chronic neck and back pain    Chronic pain disorder 01/31/2019   Clostridium difficile diarrhea    negative culture 08/17/19   Community acquired pneumonia 06/19/2020   Formatting of this note might be different from the original. 2021: hosp   Coronary artery disease    drug-eluting stents placed in proximal and mid LAD   Coronary artery disease with history of myocardial infarction without history of CABG 04/07/2013   DDD (degenerative disc disease), cervical 01/31/2019   Degenerative joint disease of thoracic spine 12/09/2018   Deviated septum    Diabetes mellitus type 2, uncontrolled 04/07/2013   Diabetic polyneuropathy associated with type 2 diabetes mellitus ([place]) 01/31/2019   Diabetic retinopathy ([place])    Dyslipidemia 12/09/2018   Dyspnea on exertion 03/26/2020   Dyspnea on exertion  Formatting of this note might be different from the original. Overview:  Dyspnea on exertion  Last Assessment & Plan:  Improved after stenting of his LAD   Elevated troponin 12/09/2018   Enteritis 11/30/2020   Formatting of this note might be different from the original. 11/30/2020:   Erectile dysfunction 11/25/2015   Essential hypertension 04/07/2013   hypertension  Formatting of this note might be different from the original. hypertension  Last Assessment & Plan:  History of essential hypertension her blood pressure measured at 162/70.  He is on benazepril 10 the morning and 20 in the evening as well as hydralazine, hydrochlorothiazide, and metoprolol.  Blood pressures have been on the high side.  I am going to increase his benazepril to 20 mg    GERD (gastroesophageal reflux disease)    "occasionally" (03/23/2015)   Headache    "more than 2/wk" (03/23/2015)   Heart attack ([place])    "in 1997 was told I had signs of a small heart attack that I didn't  know I'd had"   High cholesterol    History of cardioembolic cerebrovascular accident (CVA) 12/09/2018   History of gout    "when I was younger"   History of kidney stones    passed   History of stroke 04/20/2018   Hyperlipidemia, unspecified 04/07/2013   hyperlipidemia  Formatting of this note might be different from the original. IMO routine update IMO routine update hyperlipidemia  Last Assessment & Plan:  History of hyperlipidemia on pravastatin with lipid profile performed by his PCP 03/17/2018 revealing total cholesterol 126, LDL 55 HDL 24. Formatting of this note might be different from the original. hyperlipidemia  Last Assessment & Plan:  Hi   Hypertension    Hypogonadism male 01/11/2016   Kidney disease    Large liver 12/09/2018   Lower back pain 06/14/2015   Lumbar facet joint pain 06/14/2015  Lumbar foraminal stenosis 01/31/2019   Lymphocytosis 05/24/2015   Memory loss 03/15/2020   Migraine    "had my 1st and only in 12/2014" (03/23/2015)   Morbid obesity ([place]) 11/08/2020   Neural foraminal stenosis of cervical spine 01/31/2019   Neutrophilia 05/24/2015   Non morbid obesity due to excess calories 02/12/2016   Occipital neuralgia of right side 12/18/2016   Occlusion and stenosis of unspecified carotid artery 04/07/2013   Formatting of this note might be different from the original. Overview:  Rt CEA Oct 2015-Dr Chen  Last Assessment & Plan:  History of carotid artery disease status post right carotid endarterectomy by Dr. Bridgett Larsson October 2015 which he follows by duplex ultrasound.   Peripheral neuropathy    feet   Peripheral vascular disease (HCC)    carotid artery disease   Pneumonia 11/19,1/20   PONV (postoperative nausea and vomiting) 2015   "was told I was confused and combative in recovery from the narcotics w/my carotid OR"  "During a colonoscopy my oxygen level dropped so low that I had to be woke up.- 03/2017 colonoscopy [facility]    Sepsis [facility]) 05/08/2019   Serum total bilirubin  elevated 05/08/2019   Sleep apnea    "severe; couldn't afford mask" (03/23/2015) uses O2 at night.no C-pap   SOB (shortness of breath) 05/04/2019   Spondylosis of lumbar spine 01/31/2019   Stroke [facility]) 2014   "on walker for 2 months; left side is weaker and numb since" (03/23/2015), "2 confirmed strokes"   Stroke ([place]) 05/10/2019   Aphasia mild   Stroke, lacunar ([place]) 04/07/2013   Transaminitis 05/08/2019   Type 2 diabetes mellitus with diabetic neuropathy ([place]) 12/09/2018   Type 2 diabetes mellitus with diabetic neuropathy, with long-term current use of insulin ([place]) 07/04/2014   Type 2 diabetes mellitus, with long-term current use of insulin ([place]) 02/12/2016   Type II diabetes mellitus ([place])     Past Surgical History:  Procedure Laterality Date   BILIARY DILATION  08/11/2019   Procedure: BILIARY DILATION;  Surgeon: Irving Copas., MD;  Location: [place];  Service: Gastroenterology;;   BIOPSY  08/11/2019   Procedure: BIOPSY;  Surgeon: Irving Copas., MD;  Location: [place];  Service: Gastroenterology;;   CHOLECYSTECTOMY N/A 08/31/2019   Procedure: LAPAROSCOPIC CHOLECYSTECTOMY;  Surgeon: Greer Pickerel, MD;  Location: [facility];  Service: General;  Laterality: N/A;   [place]  03/23/2015   "2"   CORONARY STENT INTERVENTION N/A 09/29/2017   Procedure: CORONARY STENT INTERVENTION;  Surgeon: Lorretta Harp, MD;  Location: [place] CV LAB;  Service: Cardiovascular;  Laterality: N/A;   ENDARTERECTOMY Right 09/26/2014   Procedure: RIGHT CAROTID ENDARTERECTOMY WITH PATCH ANGIOPLASTY;  Surgeon: Conrad [place], MD;  Location: [facility] OR;  Service: Vascular;  Laterality: Right;   ENDOSCOPIC RETROGRADE CHOLANGIOPANCREATOGRAPHY (ERCP) WITH PROPOFOL N/A 08/11/2019   Procedure: ENDOSCOPIC RETROGRADE CHOLANGIOPANCREATOGRAPHY (ERCP) WITH PROPOFOL;  Surgeon: Irving Copas., MD;  Location: [place];   Service: Gastroenterology;  Laterality: N/A;   ESOPHAGOGASTRODUODENOSCOPY (EGD) WITH PROPOFOL N/A 08/11/2019   Procedure: ESOPHAGOGASTRODUODENOSCOPY (EGD) WITH PROPOFOL;  Surgeon: Rush Landmark Telford Nab., MD;  Location: [place];  Service: Gastroenterology;  Laterality: N/A;   EYE SURGERY Right May 2016   Cataract   EYE SURGERY Left July 2016   Cataract   HYDROCELE EXCISION  ~ 2008   IR EXCHANGE BILIARY DRAIN  07/21/2019   IR PERC CHOLECYSTOSTOMY  05/11/2019   IR RADIOLOGIST EVAL & MGMT  06/30/2019   LEFT HEART CATH AND CORONARY ANGIOGRAPHY N/A 09/29/2017   Procedure: LEFT HEART CATH AND CORONARY ANGIOGRAPHY;  Surgeon: Lorretta Harp, MD;  Location: [place] CV LAB;  Service: Cardiovascular;  Laterality: N/A;   LEFT HEART CATHETERIZATION WITH CORONARY ANGIOGRAM N/A 03/23/2015   Procedure: LEFT HEART CATHETERIZATION WITH CORONARY ANGIOGRAM;  Surgeon: Lorretta Harp, MD;  Location: [facility] CATH LAB;  Service: Cardiovascular;  Laterality: N/A;   REFRACTIVE SURGERY Bilateral 08/2014-09/2014   Diabetic retinopathy    REMOVAL OF STONES  08/11/2019   Procedure: REMOVAL OF STONES;  Surgeon: Rush Landmark Telford Nab., MD;  Location: [place];  Service: Gastroenterology;;   Azzie Almas DILATION N/A 08/11/2019   Procedure: Azzie Almas DILATION;  Surgeon: Irving Copas., MD;  Location: [place];  Service: Gastroenterology;  Laterality: N/A;   SPHINCTEROTOMY  08/11/2019   Procedure: SPHINCTEROTOMY;  Surgeon: Mansouraty, Telford Nab., MD;  Location: [facility] ENDOSCOPY;  Service: Gastroenterology;;    Social History   Socioeconomic History   Marital status: Married    Spouse name: Not on file   Number of children: 5   Years of education: 12th   Highest education level: Not on file  Occupational History   Occupation: Disabled  Tobacco Use   Smoking status: Never   Smokeless tobacco: Never  Vaping Use   Vaping Use: Never used  Substance and Sexual Activity   Alcohol use: No    Alcohol/week: 0.0  standard drinks   Drug use: No   Sexual activity: Not Currently    Partners: Female  Other Topics Concern   Not on file  Social History Narrative   Patient is married with 5 children.    Patient is right handed.   Patient has hs education.   Patient drinks 4 12 oz can sodas daily.   Social Determinants of Health   Financial Resource Strain: Not on file  Food Insecurity: Not on file  Transportation Needs: Not on file  Physical Activity: Not on file  Stress: Not on file  Social Connections: Not on file  Intimate Partner Violence: Not on file    Family History  Problem Relation Age of Onset   Heart disease Mother    Diabetes Mother    Hyperlipidemia Mother    Hypertension Mother    Cancer Father        Lung Cancer    Heart disease Father    Heart attack Father    Gout Brother    Asthma Brother    Hyperlipidemia Brother    Hypertension Brother    Diabetes Brother    Colon cancer Brother        colon   Stroke Paternal Grandfather    Colon cancer Sister    Hyperlipidemia Sister    Hypertension Sister    Gout Maternal Uncle    Diabetes Maternal Grandfather    Diabetes Daughter     Current Outpatient Medications  Medication Sig Dispense Refill   allopurinol (ZYLOPRIM) 100 MG tablet Take by mouth.     atorvastatin (LIPITOR) 80 MG tablet Take 80 mg by mouth daily.     bethanechol (URECHOLINE) 10 MG tablet Take 10 mg by mouth 3 (three) times daily.     cadexomer iodine (IODOSORB) 0.9 % gel Apply 1 application topically daily as needed for wound care. 40 g 0   carbidopa-levodopa (SINEMET IR) 25-100 MG tablet Take 0.5 tablets by mouth 2 (two) times daily. Parkinson's Disease  Cholecalciferol (VITAMIN D3 PO) Take by mouth.     clopidogrel (PLAVIX) 75 MG tablet Take 1 tablet (75 mg total) by mouth daily. 90 tablet 3   Ferrous Sulfate (FEROSUL PO) Take by mouth.     gabapentin (NEURONTIN) 300 MG capsule Take by mouth.     metoprolol succinate (TOPROL-XL) 50 MG 24 hr  tablet Take 1 tablet (50 mg total) by mouth daily. 90 tablet 3   mirtazapine (REMERON) 15 MG tablet Take 15 mg by mouth at bedtime.     montelukast (SINGULAIR) 10 MG tablet Take 10 mg by mouth at bedtime.     neomycin-polymyxin-hydrocortisone (CORTISPORIN) OTIC solution Apply 1-2 drops to each toenail corners at bedtime for pain and infection at ingrowing nails 10 mL 1   oseltamivir (TAMIFLU) 75 MG capsule Take 75 mg by mouth 2 (two) times daily.     Pancrelipase, Lip-Prot-Amyl, (CREON PO) Take by mouth.     pantoprazole (PROTONIX) 40 MG tablet Take 1 tablet (40 mg total) by mouth daily. 90 tablet 3   potassium chloride SA (KLOR-CON) 20 MEQ tablet Take 20 mEq by mouth in the morning and at bedtime.     spironolactone (ALDACTONE) 25 MG tablet Take 25 mg by mouth daily.     tamsulosin (FLOMAX) 0.4 MG CAPS capsule TAKE ONE CAPSULE BY MOUTH TWICE DAILY 30 MINUTES AFTER MEAL     Torsemide 40 MG TABS Take by mouth.     No current facility-administered medications for this visit.    Allergies  Allergen Reactions   Metformin     Other reaction(s): GI Upset (intolerance) Even with XR     REVIEW OF SYSTEMS:   [X]  denotes positive finding, [ ]  denotes negative finding Cardiac  Comments:  Chest pain or chest pressure:    Shortness of breath upon exertion:    Short of breath when lying flat:    Irregular heart rhythm:        Vascular    Pain in calf, thigh, or hip brought on by ambulation:    Pain in feet at night that wakes you up from your sleep:     Blood clot in your veins:    Leg swelling:         Pulmonary    Oxygen at home:    Productive cough:     Wheezing:         Neurologic    Sudden weakness in arms or legs:     Sudden numbness in arms or legs:     Sudden onset of difficulty speaking or slurred speech:    Temporary loss of vision in one eye:     Problems with dizziness:         Gastrointestinal    Blood in stool:     Vomited blood:         Genitourinary    Burning  when urinating:     Blood in urine:        Psychiatric    Major depression:         Hematologic    Bleeding problems:    Problems with blood clotting too easily:        Skin    Rashes or ulcers:        Constitutional    Fever or chills:      PHYSICAL EXAMINATION:  There were no vitals filed for this visit.  General:  WDWN in NAD; vital signs documented above Gait: Not observed HENT: WNL,  normocephalic Pulmonary: normal non-labored breathing , without wheezing Cardiac: regular HR,  Abdomen: soft, NT, no masses Skin: without rashes Vascular Exam/Pulses:  Right Left  Radial 2+ (normal) 2+ (normal)  Ulnar 2+ (normal) 2+ (normal)  Femoral    Popliteal    DP 2+ (normal) 2+ (normal)       Extremities: without ischemic changes, without Gangrene , without cellulitis; without open wounds;  Musculoskeletal: no muscle wasting or atrophy  Neurologic: A&O X 3;  No focal weakness or paresthesias are detected Psychiatric:  The pt has Normal affect.   Non-Invasive Vascular Imaging:   Patient with falsely elevated ABIs due to medial calcification of his arteries bilaterally.  Toe pressure normal.  Bilateral carotid arteries with mild stenosis.    ASSESSMENT/PLAN: Johnathan Keith is a nonambulatory 61 y.o. male presenting with right heel ulceration.  The ulceration is pressure induced from constant contact with the bed when laying down and ground when sitting.  ABIs are falsely elevated, however waveforms are normal.  The patient has a palpable pulse in bilateral feet.  Being the [place] has a palpable pulse in his foot, with normal waveforms on ABI, he would not benefit from revascularization.  Furthermore, being that he is nonambulatory, requiring a [place] lift and wheelchair, he is not a revascularization candidate.  I had a long conversation with Johnathan Keith and his wife regarding the above.  Should he be able to offload pressure, the site should heal.  If the site continues to progress,  we discussed that osteomyelitis of the calcaneus usually results in an amputation.  In nonambulatory individuals, the only amputation offered would be an above-knee amputation.  Bilateral carotid arteries demonstrated no flow-limiting stenosis.  This can be followed on a yearly basis.  I will see Kvon back in 1 year to follow his carotid arteries.  Told both he and his wife that I am happy to help should they need anything in the interim.  I will ensure that Dr. Cannon Kettle is updated on this visit.   Johnathan John, MD [facility] [phone]

## 2022-01-18 ENCOUNTER — Ambulatory Visit (HOSPITAL_COMMUNITY)
Admission: RE | Admit: 2022-01-18 | Discharge: 2022-01-18 | Disposition: A | Discharge status: Home / Self Care | Payer: No Typology Code available for payment source | Source: Ambulatory Visit | Attending: Vascular Surgery | Admitting: Vascular Surgery

## 2022-01-18 ENCOUNTER — Ambulatory Visit (INDEPENDENT_AMBULATORY_CARE_PROVIDER_SITE_OTHER): Payer: No Typology Code available for payment source | Admitting: Vascular Surgery

## 2022-01-18 ENCOUNTER — Ambulatory Visit (INDEPENDENT_AMBULATORY_CARE_PROVIDER_SITE_OTHER)
Admission: RE | Admit: 2022-01-18 | Discharge: 2022-01-18 | Disposition: A | Discharge status: Home / Self Care | Payer: No Typology Code available for payment source | Source: Ambulatory Visit | Attending: Vascular Surgery | Admitting: Vascular Surgery

## 2022-01-18 ENCOUNTER — Encounter: Payer: Self-pay | Admitting: Vascular Surgery

## 2022-01-18 ENCOUNTER — Other Ambulatory Visit: Payer: Self-pay

## 2022-01-18 VITALS — BP 117/62 | HR 77 | Temp 97.6°F | Resp 16 | Ht 66.0 in | Wt 201.8 lb

## 2022-01-18 DIAGNOSIS — I739 Peripheral vascular disease, unspecified: Secondary | ICD-10-CM | POA: Insufficient documentation

## 2022-01-18 DIAGNOSIS — I6522 Occlusion and stenosis of left carotid artery: Secondary | ICD-10-CM

## 2022-01-18 DIAGNOSIS — S91301A Unspecified open wound, right foot, initial encounter: Secondary | ICD-10-CM

## 2022-01-31 ENCOUNTER — Encounter (HOSPITAL_COMMUNITY): Payer: No Typology Code available for payment source

## 2022-03-11 ENCOUNTER — Telehealth: Payer: Self-pay | Admitting: *Deleted

## 2022-03-11 NOTE — Telephone Encounter (Signed)
-----   Message from Asencion Islam, [place] sent at 03/11/2022  3:59 PM EDT ----- ?I have not seen this patient since January. I recommend for them to follow the orders of [facility] since they have most recently seen the patient. ?Thanks ?Dr. Marylene Land ?----- Message ----- ?From: Lanney Gins, PMAC ?Sent: 03/11/2022   3:43 PM EDT ?To: Asencion Islam, DPM ? ?Patient's wife called and stated that [facility] stated that the doctor had said that the [facility] facility did not need to wrap the area not use any medicine due to it is never going to heal up and patient's wife wanted to call and see about that. Misty Stanley ? ? ?

## 2022-03-11 NOTE — Telephone Encounter (Signed)
Called and left a message for the patient's wife (susan) and relayed the message per Dr Marylene Land. [place] Comes ?

## 2022-03-14 ENCOUNTER — Telehealth: Payer: Self-pay | Admitting: Sports Medicine

## 2022-03-14 NOTE — Telephone Encounter (Signed)
Pt's daughter came in the office looking for you. I let her know you would be back on Tuesday. She is requesting that you call her back as soon as possible.  ?She has questions regarding the wound center and treatment for her dad.  ?Please advise.  ?

## 2022-03-19 ENCOUNTER — Ambulatory Visit (INDEPENDENT_AMBULATORY_CARE_PROVIDER_SITE_OTHER): Payer: No Typology Code available for payment source | Admitting: Sports Medicine

## 2022-03-19 DIAGNOSIS — M86171 Other acute osteomyelitis, right ankle and foot: Secondary | ICD-10-CM

## 2022-03-19 DIAGNOSIS — L89610 Pressure ulcer of right heel, unstageable: Secondary | ICD-10-CM

## 2022-03-19 DIAGNOSIS — L03119 Cellulitis of unspecified part of limb: Secondary | ICD-10-CM

## 2022-03-19 DIAGNOSIS — L02619 Cutaneous abscess of unspecified foot: Secondary | ICD-10-CM

## 2022-03-19 DIAGNOSIS — E1142 Type 2 diabetes mellitus with diabetic polyneuropathy: Secondary | ICD-10-CM

## 2022-03-19 NOTE — Progress Notes (Signed)
Virtual Visit via Telephone Note ? ?I connected with [name] on 03/19/22 at  4:45 PM EDT by telephone and verified that I am speaking with the correct person using two identifiers. ? ?Location: ?Patient: Johnathan Keith home in the presence of his wife Adelard Fridman ? ?Provider: Landis Martins, DPM, [facility] office location ?  ?I discussed the limitations, risks, security and privacy concerns of performing an evaluation and management service by telephone and the availability of in person appointments. I also discussed with the patient that there may be a patient responsible charge related to this service. The patient expressed understanding and agreed to proceed. ? ? ?History of Present Illness: ?Patient's wife presented to office on 03-14-22 concerned about worsening pressure sore of the right heel.  Patient's wife states that it [facility] they are not doing anything for the sore except wrapping it back up in the past at first for a few weeks they tried Medihoney and Iodosorb and then switched him to silver-cream but then after that they stopped and stated that it would never get better and reports that they went to the vascular doctor back in February who stated that he was not a candidate for revascularization and that if the wound worsens would require an above-the-knee amputation.  Patient and wife is concerned because the wound is much larger per wife the wound starts in his arch and goes all the way up the back of his lower leg and it is dark tissue with a smell.  Patient denies nausea vomiting fever chills does admit 1 episode of vomiting but thinks that was from difficulty swallowing a pill.  Denies any other concerns at this time. ?  ?Observations/Objective: ?Physical exam unable to be formed due to telephone nature of visit ? ?Assessment and Plan: ?Problem List Items Addressed This Visit   ?None ?Visit Diagnoses   ? ? Pressure ulcer of right heel, unstageable ([place])    -  Primary   ? Acute osteomyelitis of right ankle or foot ([place])      ? Cellulitis and abscess of foot, except toes      ? Diabetic peripheral neuropathy associated with type 2 diabetes mellitus ([place])      ? ?  ? ?Patient's wife made me aware that they cannot go to the ER without approval from [facility].  I personally contacted [facility] and spoke with RN Jenny Reichmann and after further discussion she gave me approval to tell the patient to go to [facility] ER for evaluation of his wound and for antibiotics and CT scan with likely admission and consult to general surgery for heel wound.  I called patient and wife back at phone numbers listed in chart they did not answer I left a detailed message advising them to go to the ER for further evaluation. ? ?Follow Up Instructions: ?ER as above ?  ?I discussed the assessment and treatment plan with the patient. The patient was provided an opportunity to ask questions and all were answered. The patient agreed with the plan and demonstrated an understanding of the instructions. ?  ?The patient was advised to call back or seek an in-person evaluation if the symptoms worsen or if the condition fails to improve as anticipated. ? ?I provided 10 minutes of non-face-to-face time during this encounter. ? ? ?Landis Martins, DPM  ?

## 2022-03-20 ENCOUNTER — Inpatient Hospital Stay (HOSPITAL_COMMUNITY)
Admission: EM | Admit: 2022-03-20 | Discharge: 2022-04-02 | DRG: 616 | Disposition: A | Discharge status: Home / Self Care | Payer: No Typology Code available for payment source | Source: Other Acute Inpatient Hospital | Attending: Internal Medicine | Admitting: Internal Medicine

## 2022-03-20 ENCOUNTER — Encounter (HOSPITAL_COMMUNITY): Payer: Self-pay | Admitting: Internal Medicine

## 2022-03-20 ENCOUNTER — Other Ambulatory Visit: Payer: Self-pay

## 2022-03-20 DIAGNOSIS — N189 Chronic kidney disease, unspecified: Secondary | ICD-10-CM | POA: Diagnosis present

## 2022-03-20 DIAGNOSIS — E7800 Pure hypercholesterolemia, unspecified: Secondary | ICD-10-CM | POA: Diagnosis present

## 2022-03-20 DIAGNOSIS — Z7189 Other specified counseling: Secondary | ICD-10-CM | POA: Diagnosis not present

## 2022-03-20 DIAGNOSIS — L02611 Cutaneous abscess of right foot: Secondary | ICD-10-CM | POA: Diagnosis present

## 2022-03-20 DIAGNOSIS — I252 Old myocardial infarction: Secondary | ICD-10-CM

## 2022-03-20 DIAGNOSIS — L089 Local infection of the skin and subcutaneous tissue, unspecified: Principal | ICD-10-CM | POA: Diagnosis present

## 2022-03-20 DIAGNOSIS — N179 Acute kidney failure, unspecified: Secondary | ICD-10-CM | POA: Diagnosis present

## 2022-03-20 DIAGNOSIS — E1152 Type 2 diabetes mellitus with diabetic peripheral angiopathy with gangrene: Secondary | ICD-10-CM | POA: Diagnosis present

## 2022-03-20 DIAGNOSIS — M86671 Other chronic osteomyelitis, right ankle and foot: Secondary | ICD-10-CM | POA: Diagnosis present

## 2022-03-20 DIAGNOSIS — E1142 Type 2 diabetes mellitus with diabetic polyneuropathy: Secondary | ICD-10-CM | POA: Diagnosis present

## 2022-03-20 DIAGNOSIS — L89311 Pressure ulcer of right buttock, stage 1: Secondary | ICD-10-CM | POA: Diagnosis present

## 2022-03-20 DIAGNOSIS — Z8249 Family history of ischemic heart disease and other diseases of the circulatory system: Secondary | ICD-10-CM

## 2022-03-20 DIAGNOSIS — I69351 Hemiplegia and hemiparesis following cerebral infarction affecting right dominant side: Secondary | ICD-10-CM

## 2022-03-20 DIAGNOSIS — Z6833 Body mass index (BMI) 33.0-33.9, adult: Secondary | ICD-10-CM

## 2022-03-20 DIAGNOSIS — I513 Intracardiac thrombosis, not elsewhere classified: Secondary | ICD-10-CM | POA: Diagnosis present

## 2022-03-20 DIAGNOSIS — G894 Chronic pain syndrome: Secondary | ICD-10-CM | POA: Diagnosis present

## 2022-03-20 DIAGNOSIS — Z515 Encounter for palliative care: Secondary | ICD-10-CM | POA: Diagnosis not present

## 2022-03-20 DIAGNOSIS — L03115 Cellulitis of right lower limb: Secondary | ICD-10-CM | POA: Diagnosis present

## 2022-03-20 DIAGNOSIS — E1165 Type 2 diabetes mellitus with hyperglycemia: Secondary | ICD-10-CM | POA: Diagnosis present

## 2022-03-20 DIAGNOSIS — I5022 Chronic systolic (congestive) heart failure: Secondary | ICD-10-CM | POA: Diagnosis present

## 2022-03-20 DIAGNOSIS — L8990 Pressure ulcer of unspecified site, unspecified stage: Secondary | ICD-10-CM | POA: Diagnosis present

## 2022-03-20 DIAGNOSIS — I5032 Chronic diastolic (congestive) heart failure: Secondary | ICD-10-CM | POA: Diagnosis present

## 2022-03-20 DIAGNOSIS — E113592 Type 2 diabetes mellitus with proliferative diabetic retinopathy without macular edema, left eye: Secondary | ICD-10-CM | POA: Diagnosis present

## 2022-03-20 DIAGNOSIS — I96 Gangrene, not elsewhere classified: Secondary | ICD-10-CM | POA: Diagnosis present

## 2022-03-20 DIAGNOSIS — Z7902 Long term (current) use of antithrombotics/antiplatelets: Secondary | ICD-10-CM | POA: Diagnosis not present

## 2022-03-20 DIAGNOSIS — J449 Chronic obstructive pulmonary disease, unspecified: Secondary | ICD-10-CM | POA: Diagnosis present

## 2022-03-20 DIAGNOSIS — Z823 Family history of stroke: Secondary | ICD-10-CM

## 2022-03-20 DIAGNOSIS — H5461 Unqualified visual loss, right eye, normal vision left eye: Secondary | ICD-10-CM | POA: Diagnosis present

## 2022-03-20 DIAGNOSIS — D649 Anemia, unspecified: Secondary | ICD-10-CM | POA: Diagnosis not present

## 2022-03-20 DIAGNOSIS — L97419 Non-pressure chronic ulcer of right heel and midfoot with unspecified severity: Secondary | ICD-10-CM | POA: Diagnosis present

## 2022-03-20 DIAGNOSIS — Z794 Long term (current) use of insulin: Secondary | ICD-10-CM

## 2022-03-20 DIAGNOSIS — Z825 Family history of asthma and other chronic lower respiratory diseases: Secondary | ICD-10-CM

## 2022-03-20 DIAGNOSIS — I10 Essential (primary) hypertension: Secondary | ICD-10-CM | POA: Diagnosis present

## 2022-03-20 DIAGNOSIS — S88111A Complete traumatic amputation at level between knee and ankle, right lower leg, initial encounter: Principal | ICD-10-CM

## 2022-03-20 DIAGNOSIS — I6389 Other cerebral infarction: Secondary | ICD-10-CM | POA: Diagnosis not present

## 2022-03-20 DIAGNOSIS — G4730 Sleep apnea, unspecified: Secondary | ICD-10-CM | POA: Diagnosis present

## 2022-03-20 DIAGNOSIS — D509 Iron deficiency anemia, unspecified: Secondary | ICD-10-CM | POA: Diagnosis not present

## 2022-03-20 DIAGNOSIS — E1140 Type 2 diabetes mellitus with diabetic neuropathy, unspecified: Secondary | ICD-10-CM

## 2022-03-20 DIAGNOSIS — E1169 Type 2 diabetes mellitus with other specified complication: Principal | ICD-10-CM | POA: Diagnosis present

## 2022-03-20 DIAGNOSIS — I130 Hypertensive heart and chronic kidney disease with heart failure and stage 1 through stage 4 chronic kidney disease, or unspecified chronic kidney disease: Secondary | ICD-10-CM | POA: Diagnosis present

## 2022-03-20 DIAGNOSIS — Z7401 Bed confinement status: Secondary | ICD-10-CM

## 2022-03-20 DIAGNOSIS — L89321 Pressure ulcer of left buttock, stage 1: Secondary | ICD-10-CM | POA: Diagnosis present

## 2022-03-20 DIAGNOSIS — E440 Moderate protein-calorie malnutrition: Secondary | ICD-10-CM | POA: Insufficient documentation

## 2022-03-20 DIAGNOSIS — N401 Enlarged prostate with lower urinary tract symptoms: Secondary | ICD-10-CM | POA: Diagnosis present

## 2022-03-20 DIAGNOSIS — Z801 Family history of malignant neoplasm of trachea, bronchus and lung: Secondary | ICD-10-CM

## 2022-03-20 DIAGNOSIS — K219 Gastro-esophageal reflux disease without esophagitis: Secondary | ICD-10-CM | POA: Diagnosis present

## 2022-03-20 DIAGNOSIS — M109 Gout, unspecified: Secondary | ICD-10-CM | POA: Diagnosis present

## 2022-03-20 DIAGNOSIS — D631 Anemia in chronic kidney disease: Secondary | ICD-10-CM | POA: Diagnosis present

## 2022-03-20 DIAGNOSIS — G9341 Metabolic encephalopathy: Secondary | ICD-10-CM | POA: Diagnosis not present

## 2022-03-20 DIAGNOSIS — I6990 Unspecified sequelae of unspecified cerebrovascular disease: Secondary | ICD-10-CM | POA: Diagnosis not present

## 2022-03-20 DIAGNOSIS — Z87442 Personal history of urinary calculi: Secondary | ICD-10-CM

## 2022-03-20 DIAGNOSIS — E669 Obesity, unspecified: Secondary | ICD-10-CM | POA: Diagnosis present

## 2022-03-20 DIAGNOSIS — Z833 Family history of diabetes mellitus: Secondary | ICD-10-CM

## 2022-03-20 DIAGNOSIS — Z955 Presence of coronary angioplasty implant and graft: Secondary | ICD-10-CM

## 2022-03-20 DIAGNOSIS — Z86010 Personal history of colonic polyps: Secondary | ICD-10-CM

## 2022-03-20 DIAGNOSIS — E11621 Type 2 diabetes mellitus with foot ulcer: Secondary | ICD-10-CM | POA: Diagnosis present

## 2022-03-20 DIAGNOSIS — Z9049 Acquired absence of other specified parts of digestive tract: Secondary | ICD-10-CM

## 2022-03-20 DIAGNOSIS — Z800 Family history of malignant neoplasm of digestive organs: Secondary | ICD-10-CM

## 2022-03-20 DIAGNOSIS — Z993 Dependence on wheelchair: Secondary | ICD-10-CM

## 2022-03-20 DIAGNOSIS — I2510 Atherosclerotic heart disease of native coronary artery without angina pectoris: Secondary | ICD-10-CM

## 2022-03-20 DIAGNOSIS — E1122 Type 2 diabetes mellitus with diabetic chronic kidney disease: Secondary | ICD-10-CM | POA: Diagnosis present

## 2022-03-20 DIAGNOSIS — M869 Osteomyelitis, unspecified: Secondary | ICD-10-CM | POA: Diagnosis not present

## 2022-03-20 DIAGNOSIS — Z83438 Family history of other disorder of lipoprotein metabolism and other lipidemia: Secondary | ICD-10-CM

## 2022-03-20 LAB — COMPREHENSIVE METABOLIC PANEL
ALT: 16 U/L (ref 0–44)
AST: 13 U/L — ABNORMAL LOW (ref 15–41)
Albumin: 3 g/dL — ABNORMAL LOW (ref 3.5–5.0)
Alkaline Phosphatase: 58 U/L (ref 38–126)
Anion gap: 9 (ref 5–15)
BUN: 29 mg/dL — ABNORMAL HIGH (ref 8–23)
CO2: 26 mmol/L (ref 22–32)
Calcium: 9.3 mg/dL (ref 8.9–10.3)
Chloride: 102 mmol/L (ref 98–111)
Creatinine, Ser: 1.34 mg/dL — ABNORMAL HIGH (ref 0.61–1.24)
GFR, Estimated: 60 mL/min — ABNORMAL LOW (ref >60)
Glucose, Bld: 208 mg/dL — ABNORMAL HIGH (ref 70–99)
Potassium: 4 mmol/L (ref 3.5–5.1)
Sodium: 137 mmol/L (ref 135–145)
Total Bilirubin: 0.4 mg/dL (ref 0.3–1.2)
Total Protein: 7.7 g/dL (ref 6.5–8.1)

## 2022-03-20 LAB — CBC WITH DIFFERENTIAL/PLATELET
Abs Immature Granulocytes: 0.09 10*3/uL — ABNORMAL HIGH (ref 0.00–0.07)
Basophils Absolute: 0 10*3/uL (ref 0.0–0.1)
Basophils Relative: 0 %
Eosinophils Absolute: 0.3 10*3/uL (ref 0.0–0.5)
Eosinophils Relative: 2 %
HCT: 30.3 % — ABNORMAL LOW (ref 39.0–52.0)
Hemoglobin: 9.7 g/dL — ABNORMAL LOW (ref 13.0–17.0)
Immature Granulocytes: 1 %
Lymphocytes Relative: 16 %
Lymphs Abs: 2.4 10*3/uL (ref 0.7–4.0)
MCH: 31 pg (ref 26.0–34.0)
MCHC: 32 g/dL (ref 30.0–36.0)
MCV: 96.8 fL (ref 80.0–100.0)
Monocytes Absolute: 1 10*3/uL (ref 0.1–1.0)
Monocytes Relative: 7 %
Neutro Abs: 11.1 10*3/uL — ABNORMAL HIGH (ref 1.7–7.7)
Neutrophils Relative %: 74 %
Platelets: 353 10*3/uL (ref 150–400)
RBC: 3.13 MIL/uL — ABNORMAL LOW (ref 4.22–5.81)
RDW: 15.2 % (ref 11.5–15.5)
WBC: 14.9 10*3/uL — ABNORMAL HIGH (ref 4.0–10.5)
nRBC: 0 % (ref 0.0–0.2)

## 2022-03-20 LAB — GLUCOSE, CAPILLARY: Glucose-Capillary: 189 mg/dL — ABNORMAL HIGH (ref 70–99)

## 2022-03-20 LAB — MAGNESIUM: Magnesium: 2.1 mg/dL (ref 1.7–2.4)

## 2022-03-20 LAB — PHOSPHORUS: Phosphorus: 4.3 mg/dL (ref 2.5–4.6)

## 2022-03-20 MED ORDER — GABAPENTIN 100 MG PO CAPS
100.0000 mg | ORAL_CAPSULE | Freq: Two times a day (BID) | ORAL | Status: DC
  Administered 2022-03-21 – 2022-04-02 (×23): 100 mg via ORAL
  Filled 2022-03-20 (×25): qty 1

## 2022-03-20 MED ORDER — POLYETHYLENE GLYCOL 3350 17 G PO PACK
17.0000 g | PACK | Freq: Every day | ORAL | Status: DC | PRN
Start: 2022-03-20 — End: 2022-03-22

## 2022-03-20 MED ORDER — OXYCODONE HCL 5 MG PO TABS
5.0000 mg | ORAL_TABLET | Freq: Four times a day (QID) | ORAL | Status: DC | PRN
  Filled 2022-03-20: qty 1

## 2022-03-20 MED ORDER — INSULIN ASPART 100 UNIT/ML IJ SOLN
0.0000 [IU] | Freq: Every day | INTRAMUSCULAR | Status: DC
  Administered 2022-03-21 – 2022-03-31 (×4): 2 [IU] via SUBCUTANEOUS
  Administered 2022-04-01: 3 [IU] via SUBCUTANEOUS

## 2022-03-20 MED ORDER — SENNOSIDES-DOCUSATE SODIUM 8.6-50 MG PO TABS
1.0000 | ORAL_TABLET | Freq: Every day | ORAL | Status: DC
  Administered 2022-03-20 – 2022-03-21 (×2): 1 via ORAL
  Filled 2022-03-20 (×2): qty 1

## 2022-03-20 MED ORDER — TAMSULOSIN HCL 0.4 MG PO CAPS
0.4000 mg | ORAL_CAPSULE | Freq: Every day | ORAL | Status: DC
  Administered 2022-03-21 – 2022-04-01 (×10): 0.4 mg via ORAL
  Filled 2022-03-20 (×11): qty 1

## 2022-03-20 MED ORDER — ENOXAPARIN SODIUM 60 MG/0.6ML IJ SOSY
45.0000 mg | PREFILLED_SYRINGE | Freq: Every day | INTRAMUSCULAR | Status: DC
  Administered 2022-03-20: 45 mg via SUBCUTANEOUS
  Filled 2022-03-20: qty 0.6

## 2022-03-20 MED ORDER — CLOPIDOGREL BISULFATE 75 MG PO TABS
75.0000 mg | ORAL_TABLET | Freq: Every day | ORAL | Status: DC
  Administered 2022-03-21 – 2022-03-31 (×9): 75 mg via ORAL
  Filled 2022-03-20 (×11): qty 1

## 2022-03-20 MED ORDER — ONDANSETRON HCL 4 MG/2ML IJ SOLN: 4.0000 mg | Freq: Four times a day (QID) | INTRAMUSCULAR | Status: DC | PRN

## 2022-03-20 MED ORDER — PANTOPRAZOLE SODIUM 40 MG PO TBEC
40.0000 mg | DELAYED_RELEASE_TABLET | Freq: Every day | ORAL | Status: DC
  Administered 2022-03-21 – 2022-03-31 (×10): 40 mg via ORAL
  Filled 2022-03-20 (×11): qty 1

## 2022-03-20 MED ORDER — ACETAMINOPHEN 325 MG PO TABS
650.0000 mg | ORAL_TABLET | Freq: Four times a day (QID) | ORAL | Status: DC | PRN
  Administered 2022-03-21 – 2022-03-26 (×5): 650 mg via ORAL
  Filled 2022-03-20 (×5): qty 2

## 2022-03-20 MED ORDER — LACTATED RINGERS IV SOLN: INTRAVENOUS | Status: AC

## 2022-03-20 MED ORDER — ATORVASTATIN CALCIUM 80 MG PO TABS
80.0000 mg | ORAL_TABLET | Freq: Every day | ORAL | Status: DC
  Administered 2022-03-21 – 2022-04-02 (×12): 80 mg via ORAL
  Filled 2022-03-20: qty 2
  Filled 2022-03-20: qty 1
  Filled 2022-03-20: qty 2
  Filled 2022-03-20 (×2): qty 1
  Filled 2022-03-20: qty 2
  Filled 2022-03-20 (×6): qty 1
  Filled 2022-03-20: qty 2

## 2022-03-20 MED ORDER — VANCOMYCIN HCL 2000 MG/400ML IV SOLN
2000.0000 mg | Freq: Once | INTRAVENOUS | Status: AC
Start: 2022-03-20 — End: 2022-03-21
  Administered 2022-03-21: 2000 mg via INTRAVENOUS
  Filled 2022-03-20: qty 400

## 2022-03-20 MED ORDER — METRONIDAZOLE 500 MG/100ML IV SOLN
500.0000 mg | Freq: Two times a day (BID) | INTRAVENOUS | Status: DC
  Administered 2022-03-21 – 2022-03-28 (×15): 500 mg via INTRAVENOUS
  Filled 2022-03-20 (×15): qty 100

## 2022-03-20 MED ORDER — ASPIRIN EC 81 MG PO TBEC
81.0000 mg | DELAYED_RELEASE_TABLET | Freq: Every day | ORAL | Status: DC
  Administered 2022-03-21 – 2022-04-02 (×11): 81 mg via ORAL
  Filled 2022-03-20 (×13): qty 1

## 2022-03-20 MED ORDER — SODIUM CHLORIDE 0.9 % IV SOLN
2.0000 g | Freq: Three times a day (TID) | INTRAVENOUS | Status: DC
  Administered 2022-03-20 – 2022-03-27 (×20): 2 g via INTRAVENOUS
  Filled 2022-03-20 (×21): qty 12.5

## 2022-03-20 MED ORDER — INSULIN ASPART 100 UNIT/ML IJ SOLN
0.0000 [IU] | Freq: Three times a day (TID) | INTRAMUSCULAR | Status: DC
  Administered 2022-03-21 – 2022-03-23 (×7): 3 [IU] via SUBCUTANEOUS
  Administered 2022-03-23: 4 [IU] via SUBCUTANEOUS
  Administered 2022-03-23 – 2022-03-24 (×2): 5 [IU] via SUBCUTANEOUS
  Administered 2022-03-24: 2 [IU] via SUBCUTANEOUS
  Administered 2022-03-24: 5 [IU] via SUBCUTANEOUS
  Administered 2022-03-25 – 2022-03-26 (×5): 2 [IU] via SUBCUTANEOUS
  Administered 2022-03-29: 3 [IU] via SUBCUTANEOUS
  Administered 2022-03-29: 2 [IU] via SUBCUTANEOUS
  Administered 2022-03-29 – 2022-03-30 (×4): 3 [IU] via SUBCUTANEOUS
  Administered 2022-03-31: 5 [IU] via SUBCUTANEOUS
  Administered 2022-03-31: 3 [IU] via SUBCUTANEOUS
  Administered 2022-03-31: 8 [IU] via SUBCUTANEOUS
  Administered 2022-04-01: 11 [IU] via SUBCUTANEOUS
  Administered 2022-04-01 (×2): 8 [IU] via SUBCUTANEOUS
  Administered 2022-04-02 (×2): 11 [IU] via SUBCUTANEOUS

## 2022-03-20 MED ORDER — MONTELUKAST SODIUM 10 MG PO TABS
10.0000 mg | ORAL_TABLET | Freq: Every day | ORAL | Status: DC
  Administered 2022-03-21 – 2022-04-01 (×11): 10 mg via ORAL
  Filled 2022-03-20 (×12): qty 1

## 2022-03-20 MED ORDER — HYDROMORPHONE HCL 1 MG/ML IJ SOLN
0.5000 mg | INTRAMUSCULAR | Status: DC | PRN
  Administered 2022-03-24: 0.5 mg via INTRAVENOUS
  Filled 2022-03-20: qty 0.5

## 2022-03-20 NOTE — H&P (Addendum)
?History and Physical ? ?Johnathan Keith MLY:[id number] DOB: [birth year] DOA: 03/20/2022 ? ?Referring physician: Dr. Marlowe Sax, [facility], accepted the patient on 03/20/22 at 0425AM  ?PCP: Johnathan Greenhouse, MD  ?Outpatient Specialists: Cardiology, podiatry. ?Patient coming from: Direct transfer from [facility]. ? ?Chief Complaint: Right heel pain with foul smell. ? ?HPI: Johnathan Keith is a 61 y.o. male with medical history significant for coronary artery disease status post PCI with stenting on DAPT, essential hypertension, CVA, HFrEF 40 to 45%, asthma, COPD, GERD, gout, type 2 diabetes, diabetic polyneuropathy, wheelchair-bound for the past 1 year, who initially presented from home to [facility] due to concern for right calcaneus infection.  His wife noted foul-smelling odor.  His wife at bedside states she has been doing the wound change dressing and noted foul-smelling and dark tissue.  She reports prior evaluation by vascular surgery, no lack of blood flow in the right foot.  Upon presentation to the ED at [facility], the patient is afebrile and vital signs are stable.  CT right lower extremity revealed possible abscess involving his right calcaneus, measuring approximately 4 x 2 x 1 cm.  No CT evidence of osteomyelitis.  Lab studies remarkable for ESR 29, CRP 55.9.  WBC 12.1, hemoglobin 9.8/hematocrit 30.9, BUN 33, creatinine 1.30.  Patient received ampicillin/sulbactam in the ED and 1 dose of IV vancomycin prior to transfer to [facility], room 1323.  Patient was accepted and admitted by hospitalist service, [place]. ? ?ED Course: ED course as stated above. ? ?Review of Systems: ?Review of systems as noted in the HPI. All other systems reviewed and are negative. ? ? ?Past Medical History:  ?Diagnosis Date  ? Abdominal distention   ? Abnormal CT scan 12/09/2018  ? Abnormal nuclear cardiac imaging test   ? High-risk nuclear stress test   ? Acute febrile illness 05/08/2019  ? Acute pancreatitis 05/08/2019  ?  Adjustment disorder with mixed anxiety and depressed mood 07/27/2020  ? AKI (acute kidney injury) ([place]) 03/26/2020  ? Arthritis   ? Arthropathy of cervical facet joint 01/31/2019  ? Bicuspid aortic valve 03/27/2020  ? Formatting of this note might be different from the original. 2020: ECHO  ? Bilateral low back pain without sciatica 09/13/2015  ? Bilateral lower extremity edema 08/18/2018  ? Bilateral lower extremity edema  Formatting of this note might be different from the original. Bilateral lower extremity edema  Last Assessment & Plan:  He does have 1-2+ pitting edema.  I am going to increase his Lasix to 80 mg a day, and will check a basic metabolic panel in 7 to 10 days.  He will see Cyril Mourning back in 1 month for follow-up of lab work.  ? 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 Bradycardia 04/07/2020  ? CAD -S/P PCI-2016 11/30/2015  ? LAD DES 2016. Admitted to [facility] Sept 2017 with SOB, not felt to be in CHF, Myoview was negative for sichemia  Formatting of this note might be different from the original. Overview:  LAD DES 2016. Admitted to [facility] Sept 2017 with SOB, not felt to be in CHF, Myoview was negative for sichemia  Last Assessment & Plan:  History of CAD status post LAD stenting by myself in 2016 with re-intervention because o  ? Carotid artery stenosis- 09/26/2014  ? Rt CEA Oct 2015-Dr Chen  ? Carotid stenosis 04/07/2013  ? Rt CEA Oct 2015-Dr Chen  Last Assessment & Plan:  History of carotid artery disease status post right carotid  endarterectomy by Dr. Bridgett Larsson October 2015 which he follows by duplex ultrasound. Formatting of this note might be different from the original. Rt CEA Oct 2015-Dr Bridgett Larsson  Last Assessment & Plan:  History of carotid artery disease status post right carotid endarterectomy by Dr. Bridgett Larsson October 2015  ? Carpal tunnel syndrome 07/17/2015  ? 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[place]) 09/13/2015  ? Cerebrovascular accident (CVA) due to thrombosis of  right middle cerebral artery ([place]) 07/04/2014  ? Questionable small stroke in 2014 with transient Lt sided weakness in setting of carotid disease.  Formatting of this note might be different from the original. Overview:  Questionable small stroke in 2014 with transient Lt sided weakness in setting of carotid disease.  Last Assessment & Plan:  History of multiple strokes in the past dating back to 2014 with a recent stroke in April of this year a  ? Cervical spine disease 04/07/2013  ? Cervical spondylosis without myelopathy 01/31/2019  ? Cholecystitis 06/24/2019  ? Cholelithiases 12/09/2018  ? Chronic diarrhea 12/09/2018  ? Chronic hypoxemic respiratory failure ([place]) 06/19/2020  ? Chronic neck and back pain   ? Chronic pain disorder 01/31/2019  ? Clostridium difficile diarrhea   ? negative culture 08/17/19  ? Community acquired pneumonia 06/19/2020  ? Formatting of this note might be different from the original. 2021: hosp  ? Coronary artery disease   ? drug-eluting stents placed in proximal and mid LAD  ? Coronary artery disease with history of myocardial infarction without history of CABG 04/07/2013  ? DDD (degenerative disc disease), cervical 01/31/2019  ? 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 Deviated septum   ? Diabetes mellitus type 2, uncontrolled 04/07/2013  ? Diabetic polyneuropathy associated with type 2 diabetes mellitus ([place]) 01/31/2019  ? Diabetic retinopathy ([place])   ? Dyslipidemia 12/09/2018  ? Dyspnea on exertion 03/26/2020  ? Dyspnea on exertion  Formatting of this note might be different from the original. Overview:  Dyspnea on exertion  Last Assessment & Plan:  Improved after stenting of his LAD  ? Elevated troponin 12/09/2018  ? Enteritis 11/30/2020  ? Formatting of this note might be different from the original. 11/30/2020:  ? Erectile dysfunction 11/25/2015  ? Essential hypertension 04/07/2013  ? hypertension  Formatting of this note might be different from the original. hypertension  Last  Assessment & Plan:  History of essential hypertension her blood pressure measured at 162/70.  He is on benazepril 10 the morning and 20 in the evening as well as hydralazine, hydrochlorothiazide, and metoprolol.  Blood pressures have been on the high side.  I am going to increase his benazepril to 20 mg   ? GERD (gastroesophageal reflux disease)   ? "occasionally" (03/23/2015)  ? Headache   ? "more than 2/wk" (03/23/2015)  ? Heart attack ([place])   ? "in 1997 was told I had signs of a small heart attack that I didn't know I'd had"  ? High cholesterol   ? History of cardioembolic cerebrovascular accident (CVA) 12/09/2018  ? History of gout   ? "when I was younger"  ? History of kidney stones   ? passed  ? History of stroke 04/20/2018  ? Hyperlipidemia, unspecified 04/07/2013  ? hyperlipidemia  Formatting of this note might be different from the original. IMO routine update IMO routine update hyperlipidemia  Last Assessment & Plan:  History of hyperlipidemia on pravastatin with lipid profile performed by his PCP 03/17/2018 revealing total cholesterol 126, LDL  55 HDL 24. Formatting of this note might be different from the original. hyperlipidemia  Last Assessment & Plan:  Hi  ? Hypertension   ? Hypogonadism male 01/11/2016  ? Kidney disease   ? Large liver 12/09/2018  ? Lower back pain 06/14/2015  ? Lumbar facet joint pain 06/14/2015  ? Lumbar foraminal stenosis 01/31/2019  ? Lymphocytosis 05/24/2015  ? Memory loss 03/15/2020  ? Migraine   ? "had my 1st and only in 12/2014" (03/23/2015)  ? Morbid obesity ([place]) 11/08/2020  ? Neural foraminal stenosis of cervical spine 01/31/2019  ? Neutrophilia 05/24/2015  ? Non morbid obesity due to excess calories 02/12/2016  ? Occipital neuralgia of right side 12/18/2016  ? Occlusion and stenosis of unspecified carotid artery 04/07/2013  ? Formatting of this note might be different from the original. Overview:  Rt CEA Oct 2015-Dr Bridgett Larsson  Last Assessment & Plan:  History of carotid artery disease status post right  carotid endarterectomy by Dr. Bridgett Larsson October 2015 which he follows by duplex ultrasound.  ? Peripheral neuropathy   ? feet  ? Peripheral vascular disease ([place])   ? carotid artery disease  ? Pneumonia 11/19,1/20  ? PON

## 2022-03-21 DIAGNOSIS — L089 Local infection of the skin and subcutaneous tissue, unspecified: Secondary | ICD-10-CM | POA: Diagnosis not present

## 2022-03-21 LAB — COMPREHENSIVE METABOLIC PANEL
ALT: 13 U/L (ref 0–44)
AST: 10 U/L — ABNORMAL LOW (ref 15–41)
Albumin: 2.7 g/dL — ABNORMAL LOW (ref 3.5–5.0)
Alkaline Phosphatase: 50 U/L (ref 38–126)
Anion gap: 8 (ref 5–15)
BUN: 30 mg/dL — ABNORMAL HIGH (ref 8–23)
CO2: 26 mmol/L (ref 22–32)
Calcium: 9 mg/dL (ref 8.9–10.3)
Chloride: 104 mmol/L (ref 98–111)
Creatinine, Ser: 1.03 mg/dL (ref 0.61–1.24)
GFR, Estimated: >60 mL/min (ref >60)
Glucose, Bld: 155 mg/dL — ABNORMAL HIGH (ref 70–99)
Potassium: 3.9 mmol/L (ref 3.5–5.1)
Sodium: 138 mmol/L (ref 135–145)
Total Bilirubin: 0.4 mg/dL (ref 0.3–1.2)
Total Protein: 7.2 g/dL (ref 6.5–8.1)

## 2022-03-21 LAB — HEMOGLOBIN A1C
Hgb A1c MFr Bld: 9.5 % — ABNORMAL HIGH (ref 4.8–5.6)
Mean Plasma Glucose: 225.95 mg/dL

## 2022-03-21 LAB — CBC WITH DIFFERENTIAL/PLATELET
Abs Immature Granulocytes: 0.1 10*3/uL — ABNORMAL HIGH (ref 0.00–0.07)
Basophils Absolute: 0 10*3/uL (ref 0.0–0.1)
Basophils Relative: 0 %
Eosinophils Absolute: 0.3 10*3/uL (ref 0.0–0.5)
Eosinophils Relative: 2 %
HCT: 29.4 % — ABNORMAL LOW (ref 39.0–52.0)
Hemoglobin: 9.3 g/dL — ABNORMAL LOW (ref 13.0–17.0)
Immature Granulocytes: 1 %
Lymphocytes Relative: 19 %
Lymphs Abs: 2.4 10*3/uL (ref 0.7–4.0)
MCH: 30.8 pg (ref 26.0–34.0)
MCHC: 31.6 g/dL (ref 30.0–36.0)
MCV: 97.4 fL (ref 80.0–100.0)
Monocytes Absolute: 0.9 10*3/uL (ref 0.1–1.0)
Monocytes Relative: 7 %
Neutro Abs: 8.9 10*3/uL — ABNORMAL HIGH (ref 1.7–7.7)
Neutrophils Relative %: 71 %
Platelets: 308 10*3/uL (ref 150–400)
RBC: 3.02 MIL/uL — ABNORMAL LOW (ref 4.22–5.81)
RDW: 15.1 % (ref 11.5–15.5)
WBC: 12.6 10*3/uL — ABNORMAL HIGH (ref 4.0–10.5)
nRBC: 0 % (ref 0.0–0.2)

## 2022-03-21 LAB — GLUCOSE, CAPILLARY
Glucose-Capillary: 152 mg/dL — ABNORMAL HIGH (ref 70–99)
Glucose-Capillary: 159 mg/dL — ABNORMAL HIGH (ref 70–99)
Glucose-Capillary: 212 mg/dL — ABNORMAL HIGH (ref 70–99)

## 2022-03-21 LAB — HIV ANTIBODY (ROUTINE TESTING W REFLEX): HIV Screen 4th Generation wRfx: NONREACTIVE

## 2022-03-21 LAB — PHOSPHORUS: Phosphorus: 4.7 mg/dL — ABNORMAL HIGH (ref 2.5–4.6)

## 2022-03-21 LAB — MAGNESIUM: Magnesium: 2.2 mg/dL (ref 1.7–2.4)

## 2022-03-21 MED ORDER — FINASTERIDE 5 MG PO TABS
5.0000 mg | ORAL_TABLET | Freq: Every day | ORAL | Status: DC
  Administered 2022-03-21 – 2022-04-02 (×12): 5 mg via ORAL
  Filled 2022-03-21 (×13): qty 1

## 2022-03-21 MED ORDER — NEOMYCIN-POLYMYXIN-HC 3.5-10000-1 OT SOLN
3.0000 [drp] | Freq: Every day | OTIC | Status: DC
  Administered 2022-03-21: 3 [drp] via OTIC
  Filled 2022-03-21: qty 10

## 2022-03-21 MED ORDER — INSULIN GLARGINE-YFGN 100 UNIT/ML ~~LOC~~ SOLN
45.0000 [IU] | Freq: Two times a day (BID) | SUBCUTANEOUS | Status: DC
  Administered 2022-03-21 – 2022-03-27 (×13): 45 [IU] via SUBCUTANEOUS
  Filled 2022-03-21 (×17): qty 0.45

## 2022-03-21 MED ORDER — PRESERVISION AREDS 2 PO CAPS: 1.0000 | ORAL_CAPSULE | Freq: Every day | ORAL | Status: DC

## 2022-03-21 MED ORDER — ENOXAPARIN SODIUM 40 MG/0.4ML IJ SOSY
40.0000 mg | PREFILLED_SYRINGE | Freq: Every day | INTRAMUSCULAR | Status: DC
Start: 2022-03-21 — End: 2022-03-31
  Administered 2022-03-21 – 2022-03-30 (×10): 40 mg via SUBCUTANEOUS
  Filled 2022-03-21 (×9): qty 0.4

## 2022-03-21 MED ORDER — ALLOPURINOL 100 MG PO TABS
100.0000 mg | ORAL_TABLET | Freq: Every day | ORAL | Status: DC
  Administered 2022-03-21 – 2022-04-02 (×12): 100 mg via ORAL
  Filled 2022-03-21 (×13): qty 1

## 2022-03-21 MED ORDER — PANCRELIPASE (LIP-PROT-AMYL) 36000-114000 UNITS PO CPEP
36000.0000 [IU] | ORAL_CAPSULE | Freq: Two times a day (BID) | ORAL | Status: DC
  Administered 2022-03-21 – 2022-04-02 (×21): 36000 [IU] via ORAL
  Filled 2022-03-21: qty 3
  Filled 2022-03-21 (×4): qty 1
  Filled 2022-03-21 (×2): qty 3
  Filled 2022-03-21 (×2): qty 1
  Filled 2022-03-21: qty 3
  Filled 2022-03-21 (×2): qty 1
  Filled 2022-03-21: qty 3
  Filled 2022-03-21 (×7): qty 1
  Filled 2022-03-21: qty 3
  Filled 2022-03-21: qty 1

## 2022-03-21 MED ORDER — FERROUS SULFATE 325 (65 FE) MG PO TABS
325.0000 mg | ORAL_TABLET | Freq: Every day | ORAL | Status: DC
Start: 2022-03-21 — End: 2022-04-02
  Administered 2022-03-21 – 2022-04-02 (×12): 325 mg via ORAL
  Filled 2022-03-21 (×13): qty 1

## 2022-03-21 MED ORDER — CARBIDOPA-LEVODOPA 25-100 MG PO TABS
0.5000 | ORAL_TABLET | Freq: Two times a day (BID) | ORAL | Status: DC
  Administered 2022-03-21 – 2022-04-02 (×23): 0.5 via ORAL
  Filled 2022-03-21 (×26): qty 1

## 2022-03-21 MED ORDER — INSULIN GLARGINE-YFGN 100 UNIT/ML ~~LOC~~ SOPN: 45.0000 [IU] | PEN_INJECTOR | Freq: Two times a day (BID) | SUBCUTANEOUS | Status: DC

## 2022-03-21 MED ORDER — LEVOTHYROXINE SODIUM 25 MCG PO TABS
25.0000 ug | ORAL_TABLET | Freq: Every day | ORAL | Status: DC
  Administered 2022-03-23 – 2022-04-02 (×10): 25 ug via ORAL
  Filled 2022-03-21 (×11): qty 1

## 2022-03-21 MED ORDER — VANCOMYCIN HCL 1750 MG/350ML IV SOLN
1750.0000 mg | INTRAVENOUS | Status: DC
Start: 2022-03-22 — End: 2022-03-22
  Administered 2022-03-22: 1750 mg via INTRAVENOUS
  Filled 2022-03-21: qty 350

## 2022-03-21 MED ORDER — BUDESONIDE 0.25 MG/2ML IN SUSP
0.2500 mg | Freq: Two times a day (BID) | RESPIRATORY_TRACT | Status: DC
  Administered 2022-03-21 – 2022-04-02 (×24): 0.25 mg via RESPIRATORY_TRACT
  Filled 2022-03-21 (×24): qty 2

## 2022-03-21 MED ORDER — POTASSIUM CHLORIDE CRYS ER 20 MEQ PO TBCR
20.0000 meq | EXTENDED_RELEASE_TABLET | Freq: Two times a day (BID) | ORAL | Status: DC
  Administered 2022-03-21 – 2022-03-29 (×14): 20 meq via ORAL
  Filled 2022-03-21 (×16): qty 1

## 2022-03-21 MED ORDER — SPIRONOLACTONE 25 MG PO TABS
25.0000 mg | ORAL_TABLET | Freq: Every day | ORAL | Status: DC
  Administered 2022-03-21 – 2022-03-29 (×8): 25 mg via ORAL
  Filled 2022-03-21 (×9): qty 1

## 2022-03-21 MED ORDER — VITAMIN D 25 MCG (1000 UNIT) PO TABS
1000.0000 [IU] | ORAL_TABLET | Freq: Every day | ORAL | Status: DC
  Administered 2022-03-21 – 2022-04-02 (×11): 1000 [IU] via ORAL
  Filled 2022-03-21 (×14): qty 1

## 2022-03-21 MED ORDER — PROSIGHT PO TABS
1.0000 | ORAL_TABLET | Freq: Every day | ORAL | Status: DC
  Administered 2022-03-21 – 2022-04-02 (×12): 1 via ORAL
  Filled 2022-03-21 (×13): qty 1

## 2022-03-21 MED ORDER — TORSEMIDE 20 MG PO TABS
20.0000 mg | ORAL_TABLET | Freq: Every day | ORAL | Status: DC
  Administered 2022-03-21 – 2022-03-29 (×8): 20 mg via ORAL
  Filled 2022-03-21 (×9): qty 1

## 2022-03-21 MED ORDER — BETHANECHOL CHLORIDE 10 MG PO TABS
10.0000 mg | ORAL_TABLET | Freq: Three times a day (TID) | ORAL | Status: DC
  Administered 2022-03-21 – 2022-04-02 (×30): 10 mg via ORAL
  Filled 2022-03-21 (×39): qty 1

## 2022-03-21 MED ORDER — MIRTAZAPINE 15 MG PO TABS
15.0000 mg | ORAL_TABLET | Freq: Every day | ORAL | Status: DC
  Administered 2022-03-21 – 2022-04-01 (×11): 15 mg via ORAL
  Filled 2022-03-21 (×12): qty 1

## 2022-03-21 NOTE — TOC Initial Note (Signed)
Transition of Care (TOC) - Initial/Assessment Note  ? ? ?Patient Details  ?Name: Johnathan Keith ?MRN: [id number] ?Date of Birth: 05-12-1961 ? ?Transition of Care (TOC) CM/SW Contact:    ?Demari Gales, Meriam Sprague, RN ?Phone Number: ?03/21/2022, 3:11 PM ? ?Clinical Narrative:                 ? ?Pt is active with [facility]. Received message from [place] at [place] [phone]. Called number back to coordinate dc planning with no answer. Left voicemail. TOC will follow. ?  ?  ?  ?  ? ?Activities of Daily Living ?Home Assistive Devices/Equipment: CBG Meter, Nurse, adult, Wheelchair ?ADL Screening (condition at time of admission) ?Patient's cognitive ability adequate to safely complete daily activities?: No ?Is the patient deaf or have difficulty hearing?: No ?Does the patient have difficulty seeing, even when wearing glasses/contacts?: Yes ?Does the patient have difficulty concentrating, remembering, or making decisions?: Yes ?Patient able to express need for assistance with ADLs?: Yes ?Does the patient have difficulty dressing or bathing?: Yes ?Independently performs ADLs?: No ?Communication: Independent ?Dressing (OT): Needs assistance ?Is this a change from baseline?: Pre-admission baseline ?Grooming: Needs assistance ?Is this a change from baseline?: Pre-admission baseline ?Feeding: Needs assistance ?Is this a change from baseline?: Pre-admission baseline ?Bathing: Dependent ?Is this a change from baseline?: Pre-admission baseline ?Toileting: Dependent ?Is this a change from baseline?: Pre-admission baseline ?In/Out Bed: Dependent ?Is this a change from baseline?: Pre-admission baseline ?Walks in Home: Dependent ?Is this a change from baseline?: Pre-admission baseline ?Does the patient have difficulty walking or climbing stairs?: Yes ?Weakness of Legs: Both ?Weakness of Arms/Hands: Both ? ?  ?  ? ?Admission diagnosis:  Right foot infection [L08.9] ?Patient Active Problem List  ? Diagnosis Date Noted  ? Right foot  infection 03/20/2022  ? Candidal urinary tract infection 07/02/2021  ? Pneumonia due to COVID-19 virus 07/02/2021  ? Anemia 06/14/2021  ? Arthritis   ? Kidney disease   ? Hypertension   ? Enteritis 11/30/2020  ? Clostridium difficile diarrhea 11/23/2020  ? Morbid obesity (HCC) 11/08/2020  ? Coronary artery disease   ? Chronic neck and back pain   ? 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 Peripheral neuropathy 09/18/2020  ? Migraine 09/18/2020  ? Diabetic retinopathy (HCC) 09/18/2020  ? Type II diabetes mellitus (HCC)   ? Pneumonia   ? Peripheral vascular disease (HCC)   ? Deviated septum   ? Headache   ? Heart attack (HCC)   ? High cholesterol   ? History of gout   ? History of kidney stones   ? Adjustment disorder with mixed anxiety and depressed mood 07/27/2020  ? Chronic hypoxemic respiratory failure (HCC) 06/19/2020  ? Community acquired pneumonia 06/19/2020  ? Bradycardia 04/07/2020  ? Bicuspid aortic valve 03/27/2020  ? Dyspnea on exertion 03/26/2020  ? Acute kidney injury (HCC) 03/26/2020  ? AKI (acute kidney injury) (HCC) 03/26/2020  ? Memory loss 03/15/2020  ? Cholecystitis 06/24/2019  ? Abdominal distention   ? Acute febrile illness 05/08/2019  ? Transaminitis 05/08/2019  ? Serum total bilirubin elevated 05/08/2019  ? Sepsis (HCC) 05/08/2019  ? Acute pancreatitis 05/08/2019  ? SOB (shortness of breath) 05/04/2019  ? Arthropathy of cervical facet joint 01/31/2019  ? Neural foraminal stenosis of cervical spine 01/31/2019  ? Cervical spondylosis without myelopathy 01/31/2019  ? Chronic pain disorder 01/31/2019  ? Diabetic polyneuropathy associated with type 2 diabetes mellitus (HCC) 01/31/2019  ?  DDD (degenerative disc disease), cervical 01/31/2019  ? 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 Lumbar foraminal stenosis 01/31/2019  ? Abnormal CT scan 12/09/2018  ? Cholelithiases 12/09/2018  ? Chronic diarrhea 12/09/2018  ? Degenerative joint disease of  thoracic spine 12/09/2018  ? Dyslipidemia 12/09/2018  ? History of cardioembolic cerebrovascular accident (CVA) 12/09/2018  ? Large liver 12/09/2018  ? Type 2 diabetes mellitus with diabetic neuropathy (HCC) 12/09/2018  ? Elevated troponin 12/09/2018  ? Bilateral lower extremity edema 08/18/2018  ? History of stroke 04/20/2018  ? Sleep apnea 02/27/2017  ? Occipital neuralgia of right side 12/18/2016  ? 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 Non morbid obesity due to excess calories 02/12/2016  ? Type 2 diabetes mellitus, with long-term current use of insulin (HCC) 02/12/2016  ? Hypogonadism male 01/11/2016  ? CAD -S/P PCI-2016 11/30/2015  ? Erectile dysfunction 11/25/2015  ? 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 Bilateral low back pain without sciatica 09/13/2015  ? Carpal tunnel syndrome 07/17/2015  ? Lower back pain 06/14/2015  ? Lumbar facet joint pain 06/14/2015  ? Lymphocytosis 05/24/2015  ? Neutrophilia 05/24/2015  ? Abnormal nuclear cardiac imaging test   ? Carotid artery stenosis- 09/26/2014  ? Cerebrovascular accident (CVA) due to thrombosis of right middle cerebral artery (HCC) 07/04/2014  ? Type 2 diabetes mellitus with diabetic neuropathy, with long-term current use of insulin (HCC) 07/04/2014  ? History of CVA (cerebrovascular accident) 07/04/2014  ? PONV (postoperative nausea and vomiting) 2015  ? Hyperlipidemia, unspecified 04/07/2013  ? Essential hypertension 04/07/2013  ? Carotid stenosis 04/07/2013  ? Cervical spine disease 04/07/2013  ? Stroke, lacunar (HCC) 04/07/2013  ? Coronary artery disease with history of myocardial infarction without history of CABG 04/07/2013  ? Diabetes mellitus type 2, uncontrolled 04/07/2013  ? GERD (gastroesophageal reflux disease) 04/07/2013  ? Occlusion and stenosis of unspecified carotid artery 04/07/2013  ? Stroke [facility]) 2014  ? ?PCP:  Olive Bass, MD ?Pharmacy:  No Pharmacies  Listed ? ? ? ?Social Determinants of Health (SDOH) Interventions ?  ? ?Readmission Risk Interventions ?   ? View : No data to display.  ?  ?  ?  ? ? ? ?

## 2022-03-21 NOTE — Progress Notes (Signed)
Pharmacy Antibiotic Note ? ?Johnathan Keith is a 61 y.o. male admitted on 03/20/2022 with  R calcaneus infection .  Pharmacy has been consulted for Cefepime + Vancomycin dosing. ? ?Plan: ?Cefepime 2gm IV q8h ?Vancomycin 2gm IV x1 then 1750mg  IV q24h to target AUC 400-550 ?Check Vancomycin levels at steady state ?Monitor renal function and cx data  ? ?Height: 5\' 6"  (167.6 cm) ?Weight: 90.2 kg (198 lb 13.7 oz) ?IBW/kg (Calculated) : 63.8 ? ?Temp (24hrs), Avg:98.2 ?F (36.8 ?C), Min:98.2 ?F (36.8 ?C), Max:98.2 ?F (36.8 ?C) ? ?Recent Labs  ?Lab 03/20/22 ?2116  ?WBC 14.9*  ?CREATININE 1.34*  ?  ?Estimated Creatinine Clearance: 60.1 mL/min (A) (by C-G formula based on SCr of 1.34 mg/dL (H)).   ? ?Allergies  ?Allergen Reactions  ? Metformin   ?  Other reaction(s): GI Upset (intolerance) ?Even with XR  ? ? ?Antimicrobials this admission: ?4/12 Unasyn x1 in ED (at [place]) ?4/12 Cefepime >>  ?4/12 Vancomycin >>  ?4/13 Flagyl >> ? ?Dose adjustments this admission: ? ?Microbiology results: ?4/12 BCx: (collected at [facility]) ? ?Thank you for allowing pharmacy to be a part of this patient?s care. ? ?6/12 PharmD ?03/21/2022 12:08 AM ? ?

## 2022-03-21 NOTE — Progress Notes (Signed)
Bladder scanned patient and got > 999 mL, patient wanted to try voiding in urinal instead of the condom catheter he had on. I removed condom catheter, patient voided 700 mL in urinal. Repeated bladder scan and patient has 543 mL. Patient refuses catheter at this time because claims it causes him to have infection, and he wants to try using the urinal again.  ?

## 2022-03-21 NOTE — Evaluation (Signed)
Physical Therapy Evaluation Patient Details Name: Johnathan Keith MRN: [id number] DOB: [birth year] Today's Date: 03/21/2022  History of Present Illness  Patient is 61 y.o. male admitted on 03/20/2022 with  R calcaneus infection. PMH significant for coronary artery disease status post PCI with stenting on DAPT, HTN, CVA, HFrEF 40 to 45%, asthma, COPD, GERD, gout, DMII, diabetic polyneuropathy, wheelchair-bound for the past 1 year. His wife at bedside states she has been doing the wound change dressing and noted foul-smelling and dark tissue.  She reports prior evaluation by vascular surgery, no lack of blood flow in the right foot.  Upon presentation to the ED at [facility], the patient is afebrile and vital signs are stable.  CT right lower extremity revealed possible abscess involving his right calcaneus, measuring approximately 4 x 2 x 1 cm.  No CT evidence of osteomyelitis.   Clinical Impression  Johnathan Keith is 61 y.o. male admitted with above HPI and diagnosis. Patient is currently limited by functional impairments below (see PT problem list). Patient lives with his wife and requires mechanical lift for transfer bed>chair and total assist for ADL's. Pt spend M-F at [facility] and has the opportunity to work with therapy services to progress independence with wheelchair mobility and seated balance/pressure relief strategies. Patient will benefit from continued skilled PT interventions to address impairments and progress independence with mobility, recommending return home with assist from wife and continued care/therapy at [facility]. Acute PT will follow for trial in acute setting to progress independence with seated balance and wheelchair mobility.   Recommendations for follow up therapy are one component of a multi-disciplinary discharge planning process, led by the attending physician.  Recommendations may be updated based on patient status, additional functional criteria and  insurance authorization.  Follow Up Recommendations Other (comment) (return home and continue wtih [facility] care and PT)    Assistance Recommended at Discharge Frequent or constant Supervision/Assistance  Patient can return home with the following  Assist for transportation;Direct supervision/assist for financial management;Direct supervision/assist for medications management;Assistance with feeding;Assistance with cooking/housework;Two people to help with bathing/dressing/bathroom;Two people to help with walking and/or transfers;Help with stairs or ramp for entrance    Equipment Recommendations None recommended by PT  Recommendations for Other Services       Functional Status Assessment Patient has not had a recent decline in their functional status     Precautions / Restrictions Precautions Precautions: Fall Restrictions Weight Bearing Restrictions: No      Mobility  Bed Mobility Overal bed mobility: Needs Assistance Bed Mobility: Supine to Sit, Sit to Supine     Supine to sit: Max assist, +2 for safety/equipment, HOB elevated Sit to supine: Total assist, +2 for physical assistance, +2 for safety/equipment   General bed mobility comments: Max Assist to initiate bringing LE's off EOB and roll upper trunk to reach UE to bed rail. Max+2 to press trunk up fully and to steady self at EOB. Total Assist required to return to supine and boost in bed.    Transfers                  Lateral/Scoot Transfers: Total assist General transfer comment: pt requires mechanical lift for transfers. attemtped lateral scoots, pt with varying but minimal to no effort to assist wtih press up using bil UE's/LE's. Would require total assist to complete lateral scoots.    Ambulation/Gait                  Stairs  Wheelchair Mobility    Modified Rankin (Stroke Patients Only)       Balance Overall balance assessment: Needs assistance Sitting-balance  support: Feet supported, Bilateral upper extremity supported Sitting balance-Leahy Scale: Poor Sitting balance - Comments: pt required min-max assist to maintain seated balance and prevent posterior lean. Postural control: Posterior lean, Right lateral lean                                   Pertinent Vitals/Pain Pain Assessment Pain Assessment: Faces Faces Pain Scale: Hurts a little bit Pain Location: generalized discomfort, neck has been sore Pain Descriptors / Indicators: Discomfort Pain Intervention(s): Limited activity within patient's tolerance, Monitored during session, Repositioned    Home Living Family/patient expects to be discharged to:: Private residence Living Arrangements: Spouse/significant other Available Help at Discharge: Family;Available 24 hours/day Type of Home: House Home Access: Ramped entrance       Home Layout: One level Home Equipment: Hospital bed;Other (comment);Adaptive equipment (lift) Additional Comments: [facility] from 630am-430pm. Is offered therapy at [facility] but pt often refuses per spouse. They have a handicapp accessible van at home. Pt has an electric wc but it doesn't fit in the [employer].    Prior Function Prior Level of Function : Needs assist             Mobility Comments: Pt requires total assist for mobility with hoyer lift transfers from bed<>wheelchair. He is unable to use electric wheelchair due to it not fitting in [place] and not tilting. He has required lift for transfers since June 2022. ADLs Comments: Pt's wife provides Max/Total assist for all ADL's/IADL's.     Hand Dominance        Extremity/Trunk Assessment   Upper Extremity Assessment Upper Extremity Assessment: Generalized weakness (lmiited bil UE coordination)    Lower Extremity Assessment Lower Extremity Assessment: RLE deficits/detail;LLE deficits/detail;Generalized weakness RLE Deficits / Details: 2-/5 or less for LE strength  throughout RLE Sensation: decreased light touch;history of peripheral neuropathy RLE Coordination: decreased gross motor LLE Deficits / Details: 2-/5 or less for LE strength throughout LLE Sensation: decreased light touch;history of peripheral neuropathy LLE Coordination: decreased gross motor    Cervical / Trunk Assessment Cervical / Trunk Assessment: Normal  Communication   Communication: No difficulties  Cognition Arousal/Alertness: Awake/alert Behavior During Therapy: WFL for tasks assessed/performed Overall Cognitive Status: Within Functional Limits for tasks assessed                                          General Comments      Exercises     Assessment/Plan    PT Assessment Patient needs continued PT services  PT Problem List Decreased strength;Decreased range of motion;Decreased activity tolerance;Decreased balance;Decreased mobility;Decreased knowledge of use of DME;Decreased coordination;Decreased knowledge of precautions;Obesity;Impaired sensation       PT Treatment Interventions DME instruction;Functional mobility training;Therapeutic activities;Therapeutic exercise;Balance training;Neuromuscular re-education;Patient/family education;Wheelchair mobility training    PT Goals (Current goals can be found in the Care Plan section)  Acute Rehab PT Goals Patient Stated Goal: get foot taken care or PT Goal Formulation: With patient/family Time For Goal Achievement: 04/04/22 Potential to Achieve Goals: Fair    Frequency Min 2X/week     Co-evaluation               AM-PAC PT "  6 Clicks" Mobility  Outcome Measure Help needed turning from your back to your side while in a flat bed without using bedrails?: Total Help needed moving from lying on your back to sitting on the side of a flat bed without using bedrails?: Total Help needed moving to and from a bed to a chair (including a wheelchair)?: Total Help needed standing up from a chair using  your arms (e.g., wheelchair or bedside chair)?: Total Help needed to walk in hospital room?: Total Help needed climbing 3-5 steps with a railing? : Total 6 Click Score: 6    End of Session Equipment Utilized During Treatment: Gait belt Activity Tolerance: Patient tolerated treatment well Patient left: in bed;with call bell/phone within reach;with bed alarm set;with family/visitor present (chair position slightly) Nurse Communication: Mobility status PT Visit Diagnosis: Muscle weakness (generalized) (M62.81);Unsteadiness on feet (R26.81);Other abnormalities of gait and mobility (R26.89);Other symptoms and signs involving the nervous system (R29.898)    Time: 8841-6606 PT Time Calculation (min) (ACUTE ONLY): 37 min   Charges:   PT Evaluation $PT Eval Moderate Complexity: 1 Mod PT Treatments $Therapeutic Activity: 8-22 mins        Wynn Maudlin, DPT Acute Rehabilitation Services Office [phone] Pager [phone]   Anitra Lauth 03/21/2022, 1:38 PM

## 2022-03-21 NOTE — Progress Notes (Signed)
?PROGRESS NOTE ? ?Johnathan Keith RAX:[id number] DOB: 08/10/1961 DOA: 03/20/2022 ?PCP: Algis Greenhouse, MD ? ?Brief History   ?Johnathan Keith is a 61 y.o. male with medical history significant for coronary artery disease status post PCI with stenting on DAPT, essential hypertension, CVA, HFrEF 40 to 45%, asthma, COPD, GERD, gout, type 2 diabetes, diabetic polyneuropathy, wheelchair-bound for the past 1 year, who initially presented from home to [facility] due to concern for right calcaneus infection.  His wife noted foul-smelling odor.  His wife at bedside states she has been doing the wound change dressing and noted foul-smelling and dark tissue.  She reports prior evaluation by vascular surgery, no lack of blood flow in the right foot.  Upon presentation to the ED at [facility], the patient is afebrile and vital signs are stable.  CT right lower extremity revealed possible abscess involving his right calcaneus, measuring approximately 4 x 2 x 1 cm.  No CT evidence of osteomyelitis.  Lab studies remarkable for ESR 29, CRP 55.9.  WBC 12.1, hemoglobin 9.8/hematocrit 30.9, BUN 33, creatinine 1.30.  Patient received ampicillin/sulbactam in the ED and 1 dose of IV vancomycin prior to transfer to [facility], room 1323.  Patient was accepted and admitted by hospitalist service, [facility]. ? ?He has been admitted and placed on IV cefepime and vancomycin. Orthopedic surgery was consulted. They have spoken with Dr. Sharol Given who has asked that the patient be transferred to [facility] for evaluation and treatment by his service. Transfer orders are in place. ? ?Consultants  ?Dr. Sharol Given ? ?Procedures  ?None ? ?Antibiotics  ? ?Anti-infectives (From admission, onward)  ? ? Start     Dose/Rate Route Frequency Ordered Stop  ? 03/22/22 0600  vancomycin (VANCOREADY) IVPB 1750 mg/350 mL       ? 1,750 mg ?175 mL/hr over 120 Minutes Intravenous Every 24 hours 03/21/22 0100    ? 03/21/22 0200  metroNIDAZOLE (FLAGYL) IVPB 500 mg        ? 500 mg ?100 mL/hr over 60 Minutes Intravenous Every 12 hours 03/20/22 2307    ? 03/21/22 0000  ceFEPIme (MAXIPIME) 2 g in sodium chloride 0.9 % 100 mL IVPB       ? 2 g ?200 mL/hr over 30 Minutes Intravenous Every 8 hours 03/20/22 2305    ? 03/20/22 2315  vancomycin (VANCOREADY) IVPB 2000 mg/400 mL       ? 2,000 mg ?200 mL/hr over 120 Minutes Intravenous  Once 03/20/22 2305 03/21/22 0229  ? ?  ? ?Subjective  ?The patient is resting comfortably. No acute distress. ? ?Objective  ? ?Vitals:  ?Vitals:  ? 03/21/22 1309 03/21/22 1350  ?BP: 137/79   ?Pulse: 91   ?Resp: 14   ?Temp: 98 ?F (36.7 ?C)   ?SpO2: 100% 95%  ? ? ?Exam: ? ?Constitutional:  ?Appears calm and comfortable ?Eyes:  ?pupils and irises appear normal ?Normal lids and conjunctivae ?ENMT:  ?grossly normal hearing  ?Lips appear normal ?external ears, nose appear normal ?Oropharynx: mucosa, tongue,posterior pharynx appear normal ?Neck:  ?neck appears normal, no masses, normal ROM, supple ?no thyromegaly ?Respiratory:  ?CTA bilaterally, no w/r/r.  ?Respiratory effort normal. No retractions or accessory muscle use ?Cardiovascular:  ?RRR, no m/r/g ?No LE extremity edema   ?Normal pedal pulses ?Abdomen:  ?Abdomen appears normal; no tenderness or masses ?No hernias ?No HSM ?Musculoskeletal:  ?Digits/nails BUE: no clubbing, cyanosis, petechiae, infection ?exam of joints, bones, muscles of at least one of following: head/neck, RUE, LUE,  RLE, LLE   ?strength and tone normal, no atrophy, no abnormal movements ?No tenderness, masses ?Normal ROM, no contractures  ?gait and station ?Skin:  ?No rashes, lesions, ulcers ?palpation of skin: no induration or nodules ?Neurologic:  ?CN 2-12 intact ?Sensation all 4 extremities intact ?Psychiatric:  ?Mental status ?Mood, affect appropriate ?Orientation to person, place, time  ?judgment and insight appear intact   ? ? ?I have personally reviewed the following:  ? ?Today's Data  ?Vitals ? ?Lab Data  ?CBC ?CMP ?Hemoglobin  A1c ? ?Micro Data  ?Blood culture x 2 ? ?Imaging  ?X-ray of right foot. ? ?Cardiology Data  ? ? ?Other Data  ? ? ?Scheduled Meds: ? allopurinol  100 mg Oral Daily  ? aspirin EC  81 mg Oral Daily  ? atorvastatin  80 mg Oral Daily  ? bethanechol  10 mg Oral TID  ? budesonide  0.25 mg Nebulization BID  ? carbidopa-levodopa  0.5 tablet Oral BID  ? cholecalciferol  1,000 Units Oral Q1200  ? clopidogrel  75 mg Oral Daily  ? enoxaparin (LOVENOX) injection  40 mg Subcutaneous QHS  ? ferrous sulfate  325 mg Oral Daily  ? finasteride  5 mg Oral Daily  ? gabapentin  100 mg Oral BID  ? insulin aspart  0-15 Units Subcutaneous TID WC  ? insulin aspart  0-5 Units Subcutaneous QHS  ? insulin glargine-yfgn  45 Units Subcutaneous BID  ? [START ON 03/22/2022] levothyroxine  25 mcg Oral QAC breakfast  ? lipase/protease/amylase  36,000 Units Oral BID WC  ? mirtazapine  15 mg Oral QHS  ? montelukast  10 mg Oral QHS  ? multivitamin  1 tablet Oral Daily  ? neomycin-polymyxin-hydrocortisone  3 drop Both EARS QHS  ? pantoprazole  40 mg Oral Daily  ? potassium chloride SA  20 mEq Oral BID  ? senna-docusate  1 tablet Oral QHS  ? spironolactone  25 mg Oral Daily  ? tamsulosin  0.4 mg Oral QPC supper  ? torsemide  20 mg Oral Daily  ? ?Continuous Infusions: ? ceFEPime (MAXIPIME) IV 2 g (03/21/22 1551)  ? lactated ringers 30 mL/hr at 03/21/22 0513  ? metronidazole 500 mg (03/21/22 1403)  ? [START ON 03/22/2022] vancomycin    ? ?Problem  ?Hypertension  ?Acute Kidney Injury (Hcc)  ?Type 2 Diabetes Mellitus With Diabetic Neuropathy, With Long-Term Current Use of Insulin (Hcc)  ?Coronary Artery Disease With History of Myocardial Infarction Without History of Cabg  ?Abscess of right foot. ? ?A & P  ?Abscess/diabetic ulcer of the right foot: Foot is swollen, erythematous, and draining pus. The patient is receiving IV cefepime and vancomycin. Orthopedic surgery was consulted. They have spoken with Dr. Sharol Given who has asked that the patient be transferred to  [facility] for evaluation and treatment by his service. Transfer orders are in place. ? ?DM II with diabetic neuropathy on chronic insulin use: The patient's glucoses were controlled on lantus 45 units bid with FSBS and SSI. Hemoglobin A1c is 9.5. Continue neurontin. ? ?AKI: Patient's baseline creatinine is unknown, but he was admitted with creatinine of 1.36. It has come down to 1.03 this morning after IV fluids. Monitor creatinine, electrolytes, and volume status. ? ?Hypertension: Blood pressures are normotensive on only proscar. Monitor. ? ?Coronary artery disease with history of myocardial infarction: Continue aspirin, lipitor, metoprolol, and plavix. No chest pain. Monitor. ? ?I have seen and examined this patient myself. I have spent 38 minutes in his evaluation and care. ? ?  DVT prophylaxis: Lovenox ?Code Status: Full Code ?Family Communication: Wife is at bedside ?Disposition Plan: Transfer to [facility]  ? ?Harmony Sandell, DO ?[facility] ?Direct contact: see [email or web address]  ?7PM-7AM contact night coverage as above ?03/21/2022, 7:21 PM  LOS: 1 day  ? LOS: 1 day  ? ? ? ? ? ? ?  ?

## 2022-03-22 DIAGNOSIS — L089 Local infection of the skin and subcutaneous tissue, unspecified: Secondary | ICD-10-CM | POA: Diagnosis not present

## 2022-03-22 LAB — CBC WITH DIFFERENTIAL/PLATELET
Abs Immature Granulocytes: 0.08 10*3/uL — ABNORMAL HIGH (ref 0.00–0.07)
Basophils Absolute: 0.1 10*3/uL (ref 0.0–0.1)
Basophils Relative: 1 %
Eosinophils Absolute: 0.3 10*3/uL (ref 0.0–0.5)
Eosinophils Relative: 2 %
HCT: 31.2 % — ABNORMAL LOW (ref 39.0–52.0)
Hemoglobin: 9.7 g/dL — ABNORMAL LOW (ref 13.0–17.0)
Immature Granulocytes: 1 %
Lymphocytes Relative: 16 %
Lymphs Abs: 1.9 10*3/uL (ref 0.7–4.0)
MCH: 30.3 pg (ref 26.0–34.0)
MCHC: 31.1 g/dL (ref 30.0–36.0)
MCV: 97.5 fL (ref 80.0–100.0)
Monocytes Absolute: 1 10*3/uL (ref 0.1–1.0)
Monocytes Relative: 8 %
Neutro Abs: 8.8 10*3/uL — ABNORMAL HIGH (ref 1.7–7.7)
Neutrophils Relative %: 72 %
Platelets: 316 10*3/uL (ref 150–400)
RBC: 3.2 MIL/uL — ABNORMAL LOW (ref 4.22–5.81)
RDW: 15.1 % (ref 11.5–15.5)
WBC: 12.1 10*3/uL — ABNORMAL HIGH (ref 4.0–10.5)
nRBC: 0 % (ref 0.0–0.2)

## 2022-03-22 LAB — GLUCOSE, CAPILLARY
Glucose-Capillary: 197 mg/dL — ABNORMAL HIGH (ref 70–99)
Glucose-Capillary: 186 mg/dL — ABNORMAL HIGH (ref 70–99)
Glucose-Capillary: 198 mg/dL — ABNORMAL HIGH (ref 70–99)
Glucose-Capillary: 247 mg/dL — ABNORMAL HIGH (ref 70–99)

## 2022-03-22 LAB — BASIC METABOLIC PANEL
Anion gap: 8 (ref 5–15)
BUN: 29 mg/dL — ABNORMAL HIGH (ref 8–23)
CO2: 25 mmol/L (ref 22–32)
Calcium: 9.1 mg/dL (ref 8.9–10.3)
Chloride: 105 mmol/L (ref 98–111)
Creatinine, Ser: 1.37 mg/dL — ABNORMAL HIGH (ref 0.61–1.24)
GFR, Estimated: 58 mL/min — ABNORMAL LOW (ref >60)
Glucose, Bld: 205 mg/dL — ABNORMAL HIGH (ref 70–99)
Potassium: 4.2 mmol/L (ref 3.5–5.1)
Sodium: 138 mmol/L (ref 135–145)

## 2022-03-22 MED ORDER — ASPIRIN EC 81 MG PO TBEC: 81.0000 mg | DELAYED_RELEASE_TABLET | Freq: Every day | ORAL | Status: DC

## 2022-03-22 MED ORDER — VANCOMYCIN HCL 1500 MG/300ML IV SOLN
1500.0000 mg | INTRAVENOUS | Status: DC
  Administered 2022-03-23 – 2022-03-27 (×5): 1500 mg via INTRAVENOUS
  Filled 2022-03-22 (×5): qty 300

## 2022-03-22 NOTE — Progress Notes (Signed)
?PROGRESS NOTE ? ?Johnathan Keith [id number]:[id number] DOB: 04/02/1961 DOA: 03/20/2022 ?PCP: Algis Greenhouse, MD ? ?Brief History   ?Johnathan Keith is a 61 y.o. male with medical history significant for coronary artery disease status post PCI with stenting on DAPT, essential hypertension, CVA, HFrEF 40 to 45%, asthma, COPD, GERD, gout, type 2 diabetes, diabetic polyneuropathy, wheelchair-bound for the past 1 year, who initially presented from home to [name] due to concern for right calcaneus infection.  His wife noted foul-smelling odor.  His wife at bedside states she has been doing the wound change dressing and noted foul-smelling and dark tissue.  She reports prior evaluation by vascular surgery, no lack of blood flow in the right foot.  Upon presentation to the ED at [facility], the patient is afebrile and vital signs are stable.  CT right lower extremity revealed possible abscess involving his right calcaneus, measuring approximately 4 x 2 x 1 cm.  No CT evidence of osteomyelitis.  Lab studies remarkable for ESR 29, CRP 55.9.  WBC 12.1, hemoglobin 9.8/hematocrit 30.9, BUN 33, creatinine 1.30.  Patient received ampicillin/sulbactam in the ED and 1 dose of IV vancomycin prior to transfer to [facility], room 1323.  Patient was accepted and admitted by hospitalist service, [facility]. ? ?He has been admitted and placed on IV cefepime and vancomycin. Orthopedic surgery was consulted. They have spoken with Dr. Sharol Given who has asked that the patient be transferred to [facility] for evaluation and treatment by his service. Transfer orders are in place. ? ?Consultants  ?Dr. Sharol Given ? ?Procedures  ?None ? ?Antibiotics  ? ?Anti-infectives (From admission, onward)  ? ? Start     Dose/Rate Route Frequency Ordered Stop  ? 03/23/22 0900  vancomycin (VANCOREADY) IVPB 1500 mg/300 mL       ? 1,500 mg ?150 mL/hr over 120 Minutes Intravenous Every 24 hours 03/22/22 1020    ? 03/22/22 0600  vancomycin (VANCOREADY) IVPB 1750  mg/350 mL  Status:  Discontinued       ? 1,750 mg ?175 mL/hr over 120 Minutes Intravenous Every 24 hours 03/21/22 0100 03/22/22 1020  ? 03/21/22 0200  metroNIDAZOLE (FLAGYL) IVPB 500 mg       ? 500 mg ?100 mL/hr over 60 Minutes Intravenous Every 12 hours 03/20/22 2307    ? 03/21/22 0000  ceFEPIme (MAXIPIME) 2 g in sodium chloride 0.9 % 100 mL IVPB       ? 2 g ?200 mL/hr over 30 Minutes Intravenous Every 8 hours 03/20/22 2305    ? 03/20/22 2315  vancomycin (VANCOREADY) IVPB 2000 mg/400 mL       ? 2,000 mg ?200 mL/hr over 120 Minutes Intravenous  Once 03/20/22 2305 03/21/22 0229  ? ?  ? ?Subjective  ?The patient is resting comfortably. No acute distress. ? ?Objective  ? ?Vitals:  ?Vitals:  ? 03/22/22 0811 03/22/22 1453  ?BP:  132/77  ?Pulse:  87  ?Resp:    ?Temp:  97.9 ?F (36.6 ?C)  ?SpO2: 95% 94%  ? ? ?Exam: ? ?Constitutional:  ?Appears calm and comfortable ?Eyes:  ?pupils and irises appear normal ?Normal lids and conjunctivae ?ENMT:  ?grossly normal hearing  ?Lips appear normal ?external ears, nose appear normal ?Oropharynx: mucosa, tongue,posterior pharynx appear normal ?Neck:  ?neck appears normal, no masses, normal ROM, supple ?no thyromegaly ?Respiratory:  ?CTA bilaterally, no w/r/r.  ?Respiratory effort normal. No retractions or accessory muscle use ?Cardiovascular:  ?RRR, no m/r/g ?No LE extremity edema   ?Normal pedal  pulses ?Abdomen:  ?Abdomen appears normal; no tenderness or masses ?No hernias ?No HSM ?Musculoskeletal:  ?Digits/nails BUE: no clubbing, cyanosis, petechiae, infection ?exam of joints, bones, muscles of at least one of following: head/neck, RUE, LUE, RLE, LLE   ?strength and tone normal, no atrophy, no abnormal movements ?No tenderness, masses ?Normal ROM, no contractures  ?gait and station ?Skin:  ?No rashes, lesions, ulcers ?palpation of skin: no induration or nodules ?Neurologic:  ?CN 2-12 intact ?Sensation all 4 extremities intact ?Psychiatric:  ?Mental status ?Mood, affect  appropriate ?Orientation to person, place, time  ?judgment and insight appear intact   ? ? ?I have personally reviewed the following:  ? ?Today's Data  ?Vitals ? ?Lab Data  ?CBC ?CMP ?Hemoglobin A1c ? ?Micro Data  ?Blood culture x 2 ? ?Imaging  ?X-ray of right foot. ? ?Cardiology Data  ? ? ?Other Data  ? ? ?Scheduled Meds: ? allopurinol  100 mg Oral Daily  ? aspirin EC  81 mg Oral Daily  ? atorvastatin  80 mg Oral Daily  ? bethanechol  10 mg Oral TID  ? budesonide  0.25 mg Nebulization BID  ? carbidopa-levodopa  0.5 tablet Oral BID  ? cholecalciferol  1,000 Units Oral Q1200  ? clopidogrel  75 mg Oral Daily  ? enoxaparin (LOVENOX) injection  40 mg Subcutaneous QHS  ? ferrous sulfate  325 mg Oral Daily  ? finasteride  5 mg Oral Daily  ? gabapentin  100 mg Oral BID  ? insulin aspart  0-15 Units Subcutaneous TID WC  ? insulin aspart  0-5 Units Subcutaneous QHS  ? insulin glargine-yfgn  45 Units Subcutaneous BID  ? levothyroxine  25 mcg Oral QAC breakfast  ? lipase/protease/amylase  36,000 Units Oral BID WC  ? mirtazapine  15 mg Oral QHS  ? montelukast  10 mg Oral QHS  ? multivitamin  1 tablet Oral Daily  ? neomycin-polymyxin-hydrocortisone  3 drop Both EARS QHS  ? pantoprazole  40 mg Oral Daily  ? potassium chloride SA  20 mEq Oral BID  ? spironolactone  25 mg Oral Daily  ? tamsulosin  0.4 mg Oral QPC supper  ? torsemide  20 mg Oral Daily  ? ?Continuous Infusions: ? ceFEPime (MAXIPIME) IV 2 g (03/22/22 1513)  ? lactated ringers 30 mL/hr at 03/21/22 0513  ? metronidazole 500 mg (03/22/22 1345)  ? [START ON 03/23/2022] vancomycin    ? ?No problems updated. ?Abscess of right foot. ? ?A & P  ?Abscess/diabetic ulcer of the right foot: Foot is swollen, erythematous, and draining pus. The patient is receiving IV cefepime and vancomycin. Orthopedic surgery was consulted. They have spoken with Dr. Sharol Given who has asked that the patient be transferred to [facility] for evaluation and treatment by his service. Transfer orders are in  place. ? ?DM II with diabetic neuropathy on chronic insulin use: The patient's glucoses were controlled on lantus 45 units bid with FSBS and SSI. Hemoglobin A1c is 9.5. Continue neurontin. ? ?AKI: Patient's baseline creatinine is unknown, but he was admitted with creatinine of 1.36. It has come down to 1.03 this morning after IV fluids. Monitor creatinine, electrolytes, and volume status. ? ?Hypertension: Blood pressures are normotensive on only proscar. Monitor. ? ?Coronary artery disease with history of myocardial infarction: Continue aspirin, lipitor, metoprolol, and plavix. No chest pain. Monitor. ? ?I have seen and examined this patient myself. I have spent 32 minutes in his evaluation and care. ? ?DVT prophylaxis: Lovenox ?Code Status: Full Code ?Family  Communication: Wife is at bedside ?Disposition Plan: Transfer to [facility]  ? ?Shulamis Wenberg, DO ?[facility] ?Direct contact: see [email or web address]  ?7PM-7AM contact night coverage as above ?03/22/2022, 6:48 PM  LOS: 1 day  ? LOS: 2 days  ? ? ? ? ? ? ?  ?

## 2022-03-22 NOTE — Progress Notes (Signed)
Pharmacy Antibiotic Note ? ?Johnathan Keith is a 61 y.o. male admitted on 03/20/2022 with a right foot infection/abscess - transferred from [facility]. Pharmacy consulted to dose broad spectrum antibiotics (vancomycin + cefepime). Purulent wound - vascular has been consulted. Pending transfer to [facility] for vascular surgery evaluation.  ? ?Today, 03/22/22 ?SCr up to 1.37, CrCl ~59 mL/min ?Afebrile ? ?Plan: ?Cefepime 2 g IV q8h + metronidazole 500 mg IV q12h ?Reduce vancomycin dose to 1500 mg IV q24h for estimated AUC of 484. Goal vancomycin AUC is 400-550. ?Follow renal function, check levels at steady state as needed ? ?Height: 5\' 6"  (167.6 cm) ?Weight: 93.2 kg (205 lb 7.5 oz) ?IBW/kg (Calculated) : 63.8 ? ?Temp (24hrs), Avg:98 ?F (36.7 ?C), Min:97.8 ?F (36.6 ?C), Max:98.2 ?F (36.8 ?C) ? ?Recent Labs  ?Lab 03/20/22 ?2116 03/21/22 ?03/23/22 03/22/22 ?03/24/22  ?WBC 14.9* 12.6* 12.1*  ?CREATININE 1.34* 1.03 1.37*  ?  ?Estimated Creatinine Clearance: 59.8 mL/min (A) (by C-G formula based on SCr of 1.37 mg/dL (H)).   ? ?Allergies  ?Allergen Reactions  ? Metformin   ?  Other reaction(s): GI Upset (intolerance) ?Even with XR  ? ? ?Antimicrobials this admission: ?cefepime 4/12 >>  ?vancomycin 4/14 >>  ?Metronidazole 4/13 >> ? ?Dose adjustments this admission: ? ?Microbiology results: ?4/13 BCx: ngtd ? ?5/13, PharmD ?03/22/2022 10:20 AM ?

## 2022-03-22 NOTE — Evaluation (Signed)
Occupational Therapy Evaluation ?Patient Details ?Name: Johnathan Keith ?MRN: [id number] ?DOB: [birth year] ?Today's Date: 03/22/2022 ? ? ?History of Present Illness Patient is 60 y.o. male admitted on 03/20/2022 with  R calcaneus infection. PMH significant for coronary artery disease status post PCI with stenting on DAPT, HTN, CVA, HFrEF 40 to 45%, asthma, COPD, GERD, gout, DMII, diabetic polyneuropathy, wheelchair-bound for the past 1 year. His wife at bedside states she has been doing the wound change dressing and noted foul-smelling and dark tissue.  She reports prior evaluation by vascular surgery, no lack of blood flow in the right foot.  Upon presentation to the ED at [facility], the patient is afebrile and vital signs are stable.  CT right lower extremity revealed possible abscess involving his right calcaneus, measuring approximately 4 x 2 x 1 cm.  No CT evidence of osteomyelitis.  ? ?Clinical Impression ?  ?[name] is a 61 year old man who presents with above medical history. On evaluation he is at his baseline - requiring assistance for rolling in bed and all ADLs. He is able to assist with UB ADLs though his upper extremities are weak (left worse than right from prior CVA). He is hoyer'd to a wheelchair and goes to [facility] Monday- Friday and reports they have been working on propelling wheelchair. From a functional standpoint he at his baseline and doesn't require acute OT services. Encouraged patient to work with therapy at [facility] in order to improve his upper body strength.  ?   ? ?Recommendations for follow up therapy are one component of a multi-disciplinary discharge planning process, led by the attending physician.  Recommendations may be updated based on patient status, additional functional criteria and insurance authorization.  ? ?Follow Up Recommendations ? Other (comment) (Therapy at [facility])  ?  ?Assistance Recommended at Discharge Frequent or constant Supervision/Assistance   ?Patient can return home with the following A lot of help with walking and/or transfers;A lot of help with bathing/dressing/bathroom;Assistance with cooking/housework;Assist for transportation;Help with stairs or ramp for entrance;Direct supervision/assist for medications management;Direct supervision/assist for financial management ? ?  ?Functional Status Assessment ? Patient has not had a recent decline in their functional status  ?Equipment Recommendations ? None recommended by OT  ?  ?Recommendations for Other Services   ? ? ?  ?Precautions / Restrictions Precautions ?Precautions: Fall ?Restrictions ?Weight Bearing Restrictions: No  ? ?  ? ?Mobility Bed Mobility ?  ?Bed Mobility: Rolling ?Rolling: Max assist ?  ?  ?  ?  ?  ?  ? ?Transfers ?  ?  ?  ?  ?  ?  ?  ?  ?  ?  ?  ? ?  ?Balance   ?  ?  ?  ?  ?  ?  ?  ?  ?  ?  ?  ?  ?  ?  ?  ?  ?  ?  ?   ? ?ADL either performed or assessed with clinical judgement  ? ?ADL Overall ADL's : At baseline ?  ?  ?  ?  ?  ?  ?  ?  ?  ?  ?  ?  ?  ?  ?  ?  ?  ?  ?  ?   ? ? ? ?Vision Patient Visual Report: No change from baseline ?   ?   ?Perception   ?  ?Praxis   ?  ? ?Pertinent Vitals/Pain Pain Assessment ?Pain Assessment: No/denies pain  ? ? ? ?  Hand Dominance Right ?  ?Extremity/Trunk Assessment Upper Extremity Assessment ?Upper Extremity Assessment: RUE deficits/detail;LUE deficits/detail ?RUE Deficits / Details: WFL ROM, 4/5 strength grossly, grip 4-/5 ?RUE Sensation: WNL ?RUE Coordination: WNL ?LUE Deficits / Details: Grossly functional ROM, 4-/5 strength, hx of cva, grip 3+/5 ?LUE Coordination: decreased fine motor;decreased gross motor ?  ?Lower Extremity Assessment ?Lower Extremity Assessment: Defer to PT evaluation ?  ?  ?  ?Communication Communication ?Communication: No difficulties ?  ?Cognition Arousal/Alertness: Awake/alert ?Behavior During Therapy: [facility] for tasks assessed/performed ?Overall Cognitive Status: Within Functional Limits for tasks assessed ?  ?  ?  ?  ?  ?   ?  ?  ?  ?  ?  ?  ?  ?  ?  ?  ?  ?  ?  ?General Comments    ? ?  ?Exercises   ?  ?Shoulder Instructions    ? ? ?Home Living Family/patient expects to be discharged to:: Private residence ?Living Arrangements: Spouse/significant other ?Available Help at Discharge: Family;Available 24 hours/day ?Type of Home: House ?Home Access: Ramped entrance ?  ?  ?Home Layout: One level ?  ?  ?  ?  ?  ?Bathroom Accessibility: Yes ?  ?Home Equipment: Hospital bed;Other (comment);Adaptive equipment (lift) ?  ?Additional Comments: [facility] from 630am-430pm. Is offered therapy at [facility] but pt often refuses per spouse. They have a handicapp accessible van at home. Pt has an electric wc but it doesn't fit in the [place]. ?  ? ?  ?Prior Functioning/Environment Prior Level of Function : Needs assist ?  ?  ?  ?  ?  ?  ?Mobility Comments: Pt requires total assist for mobility with hoyer lift transfers from bed<>wheelchair. He is unable to use electric wheelchair due to it not fitting in [place] and not tilting. He has required lift for transfers since June 2022. ?ADLs Comments: Pt's wife provides Max/Total assist for all ADL's/IADL's. - he can feed himself and assist with grooming and UB ADLs ?  ? ?  ?  ?OT Problem List:   ?  ?   ?OT Treatment/Interventions:    ?  ?OT Goals(Current goals can be found in the care plan section) Acute Rehab OT Goals ?OT Goal Formulation: All assessment and education complete, DC therapy  ?OT Frequency:   ?  ? ?Co-evaluation   ?  ?  ?  ?  ? ?  ?AM-PAC OT "6 Clicks" Daily Activity     ?Outcome Measure Help from another person eating meals?: A Little ?Help from another person taking care of personal grooming?: A Little ?Help from another person toileting, which includes using toliet, bedpan, or urinal?: Total ?Help from another person bathing (including washing, rinsing, drying)?: A Lot ?Help from another person to put on and taking off regular upper body clothing?: A Lot ?Help from another person to put  on and taking off regular lower body clothing?: Total ?6 Click Score: 12 ?  ?End of Session Nurse Communication:  (okay to see) ? ?Activity Tolerance: Patient tolerated treatment well ?Patient left: in bed;with bed alarm set;with family/visitor present ? ?OT Visit Diagnosis: Muscle weakness (generalized) (M62.81)  ?              ?Time: 0240-9735 ?OT Time Calculation (min): 10 min ?Charges:  OT General Charges ?$OT Visit: 1 Visit ?OT Evaluation ?$OT Eval Low Complexity: 1 Low ? ?Zeph Riebel, OTR/L ?Acute Care Rehab Services  ?Office [phone] ?Pager: [phone]  ? ?Zeta Bucy L Zeva Leber ?  03/22/2022, 9:11 AM ?

## 2022-03-22 NOTE — Progress Notes (Signed)
Previous night RN reported to me not to change dressing per nurse report she received. Patient is to be evaluated soon by ortho and transferred to [facility].  Dressing was recently reinforced by previous night nurse.  MD, please put in dressing orders if this dressing should be changed before seeing ortho. Thanks, Roderick Pee ? ?

## 2022-03-22 NOTE — Progress Notes (Signed)
Inpatient Diabetes Program Recommendations ? ?AACE/ADA: New Consensus Statement on Inpatient Glycemic Control (2015) ? ?Target Ranges:  Prepandial:   less than 140 mg/dL ?     Peak postprandial:   less than 180 mg/dL (1-2 hours) ?     Critically ill patients:  140 - 180 mg/dL  ? ?Lab Results  ?Component Value Date  ? GLUCAP 186 (H) 03/22/2022  ? HGBA1C 9.5 (H) 03/21/2022  ? ? ?Review of Glycemic Control ? ?Diabetes history: DM2 ?Outpatient Diabetes medications: Semglee 48 units BID, Humalog 2-16 units TID ?Current orders for Inpatient glycemic control: Semglee 45 units BID, Novolog 0-15 units TID with meals and 0-5 HS ? ?HgbA1C - 9.5% ?PO intake < 50% ? ?Inpatient Diabetes Program Recommendations:   ? ?When po intake improves and pt is eating > 50%, consider adding Novolog 4 units TID with meals ? ?Spoke with pt and wife at bedside. Pt's wife states pt goes to an adult daycare and receives sliding scale insulin and no meal coverage. Wife said blood sugars have been on the high side when he eats well, d/t no meal coverage insulin, only correction. Discussed HgbA1C of 9.5%. Wife states at one point it was > 11% and home insulin orders were changed. Has meter at home and checks blood sugars several times/day. Pt had CVA years ago and wife manages pt's diabetes at home. (Wife has Type 1 DM and great understanding of his goals.) ? ?Waiting for transfer to [facility] to see Dr Lajoyce Corners regarding foot wound.  ? ?Continue to follow. ? ?Thank you. ?Ailene Ards, RD, LDN, CDE ?Inpatient Diabetes Coordinator ?[phone]  ? ? ? ? ? ? ? ?

## 2022-03-23 DIAGNOSIS — L089 Local infection of the skin and subcutaneous tissue, unspecified: Secondary | ICD-10-CM | POA: Diagnosis not present

## 2022-03-23 LAB — CBC WITH DIFFERENTIAL/PLATELET
Abs Immature Granulocytes: 0.06 10*3/uL (ref 0.00–0.07)
Basophils Absolute: 0 10*3/uL (ref 0.0–0.1)
Basophils Relative: 0 %
Eosinophils Absolute: 0.2 10*3/uL (ref 0.0–0.5)
Eosinophils Relative: 2 %
HCT: 30.1 % — ABNORMAL LOW (ref 39.0–52.0)
Hemoglobin: 9.4 g/dL — ABNORMAL LOW (ref 13.0–17.0)
Immature Granulocytes: 1 %
Lymphocytes Relative: 20 %
Lymphs Abs: 1.9 10*3/uL (ref 0.7–4.0)
MCH: 30.3 pg (ref 26.0–34.0)
MCHC: 31.2 g/dL (ref 30.0–36.0)
MCV: 97.1 fL (ref 80.0–100.0)
Monocytes Absolute: 0.7 10*3/uL (ref 0.1–1.0)
Monocytes Relative: 7 %
Neutro Abs: 6.9 10*3/uL (ref 1.7–7.7)
Neutrophils Relative %: 70 %
Platelets: 303 10*3/uL (ref 150–400)
RBC: 3.1 MIL/uL — ABNORMAL LOW (ref 4.22–5.81)
RDW: 15.1 % (ref 11.5–15.5)
WBC: 9.9 10*3/uL (ref 4.0–10.5)
nRBC: 0 % (ref 0.0–0.2)

## 2022-03-23 LAB — BASIC METABOLIC PANEL
Anion gap: 6 (ref 5–15)
BUN: 28 mg/dL — ABNORMAL HIGH (ref 8–23)
CO2: 25 mmol/L (ref 22–32)
Calcium: 9.2 mg/dL (ref 8.9–10.3)
Chloride: 107 mmol/L (ref 98–111)
Creatinine, Ser: 1.39 mg/dL — ABNORMAL HIGH (ref 0.61–1.24)
GFR, Estimated: 57 mL/min — ABNORMAL LOW (ref >60)
Glucose, Bld: 215 mg/dL — ABNORMAL HIGH (ref 70–99)
Potassium: 4 mmol/L (ref 3.5–5.1)
Sodium: 138 mmol/L (ref 135–145)

## 2022-03-23 LAB — GLUCOSE, CAPILLARY
Glucose-Capillary: 185 mg/dL — ABNORMAL HIGH (ref 70–99)
Glucose-Capillary: 204 mg/dL — ABNORMAL HIGH (ref 70–99)
Glucose-Capillary: 222 mg/dL — ABNORMAL HIGH (ref 70–99)
Glucose-Capillary: 211 mg/dL — ABNORMAL HIGH (ref 70–99)

## 2022-03-23 NOTE — Progress Notes (Signed)
?PROGRESS NOTE ? ?Johnathan Keith MHD:[id number] DOB: [birth year] DOA: 03/20/2022 ?PCP: Algis Greenhouse, MD ? ?Brief History   ?Johnathan Keith is a 61 y.o. male with medical history significant for coronary artery disease status post PCI with stenting on DAPT, essential hypertension, CVA, HFrEF 40 to 45%, asthma, COPD, GERD, gout, type 2 diabetes, diabetic polyneuropathy, wheelchair-bound for the past 1 year, who initially presented from home to [facility] due to concern for right calcaneus infection.  His wife noted foul-smelling odor.  His wife at bedside states she has been doing the wound change dressing and noted foul-smelling and dark tissue.  She reports prior evaluation by vascular surgery, no lack of blood flow in the right foot.  Upon presentation to the ED at [facility], the patient is afebrile and vital signs are stable.  CT right lower extremity revealed possible abscess involving his right calcaneus, measuring approximately 4 x 2 x 1 cm.  No CT evidence of osteomyelitis.  Lab studies remarkable for ESR 29, CRP 55.9.  WBC 12.1, hemoglobin 9.8/hematocrit 30.9, BUN 33, creatinine 1.30.  Patient received ampicillin/sulbactam in the ED and 1 dose of IV vancomycin prior to transfer to [facility], room 1323.  Patient was accepted and admitted by hospitalist service, [facility]. ? ?He has been admitted and placed on IV cefepime and vancomycin. Orthopedic surgery was consulted. They have spoken with Dr. Sharol Given who has asked that the patient be transferred to [facility] for evaluation and treatment by his service. Transfer orders are in place. ? ?I have discussed this patient with bed control. It seems that the patient will not transfer until Monday. ? ?Consultants  ?Dr. Sharol Given ? ?Procedures  ?None ? ?Antibiotics  ? ?Anti-infectives (From admission, onward)  ? ? Start     Dose/Rate Route Frequency Ordered Stop  ? 03/23/22 0900  vancomycin (VANCOREADY) IVPB 1500 mg/300 mL       ? 1,500 mg ?150 mL/hr over 120  Minutes Intravenous Every 24 hours 03/22/22 1020    ? 03/22/22 0600  vancomycin (VANCOREADY) IVPB 1750 mg/350 mL  Status:  Discontinued       ? 1,750 mg ?175 mL/hr over 120 Minutes Intravenous Every 24 hours 03/21/22 0100 03/22/22 1020  ? 03/21/22 0200  metroNIDAZOLE (FLAGYL) IVPB 500 mg       ? 500 mg ?100 mL/hr over 60 Minutes Intravenous Every 12 hours 03/20/22 2307    ? 03/21/22 0000  ceFEPIme (MAXIPIME) 2 g in sodium chloride 0.9 % 100 mL IVPB       ? 2 g ?200 mL/hr over 30 Minutes Intravenous Every 8 hours 03/20/22 2305    ? 03/20/22 2315  vancomycin (VANCOREADY) IVPB 2000 mg/400 mL       ? 2,000 mg ?200 mL/hr over 120 Minutes Intravenous  Once 03/20/22 2305 03/21/22 0229  ? ?  ? ?Subjective  ?The patient is resting comfortably. No acute distress. ? ?Objective  ? ?Vitals:  ?Vitals:  ? 03/23/22 1815 03/23/22 1817  ?BP:    ?Pulse:    ?Resp:    ?Temp:    ?SpO2: 97% 97%  ? ? ?Exam: ? ?Constitutional:  ?Appears calm and comfortable ?Respiratory:  ?CTA bilaterally, no w/r/r.  ?Respiratory effort normal. No retractions or accessory muscle use ?Cardiovascular:  ?RRR, no m/r/g ?No LE extremity edema   ?Normal pedal pulses ?Abdomen:  ?Abdomen appears normal; no tenderness or masses ?No hernias ?No HSM ?Musculoskeletal:  ?Digits/nails BUE: no clubbing, cyanosis, petechiae, infection ?exam of joints, bones,  muscles of at least one of following: head/neck, RUE, LUE, RLE, LLE   ?strength and tone normal, no atrophy, no abnormal movements ?No tenderness, masses ?Normal ROM, no contractures  ?gait and station ?Skin:  ?No rashes, lesions, ulcers ?palpation of skin: no induration or nodules ?Neurologic:  ?CN 2-12 intact ?Sensation all 4 extremities intact ?Psychiatric:  ?Mental status ?Mood, affect appropriate ?Orientation to person, place, time  ?judgment and insight appear intact   ? ? ?I have personally reviewed the following:  ? ?Today's Data  ?Vitals ? ?Lab Data  ?CBC ?BMP ? ?Micro Data  ?Blood culture x 2 ? ?Imaging   ?X-ray of right foot. ? ?Cardiology Data  ? ? ?Other Data  ? ? ?Scheduled Meds: ? allopurinol  100 mg Oral Daily  ? aspirin EC  81 mg Oral Daily  ? atorvastatin  80 mg Oral Daily  ? bethanechol  10 mg Oral TID  ? budesonide  0.25 mg Nebulization BID  ? carbidopa-levodopa  0.5 tablet Oral BID  ? cholecalciferol  1,000 Units Oral Q1200  ? clopidogrel  75 mg Oral Daily  ? enoxaparin (LOVENOX) injection  40 mg Subcutaneous QHS  ? ferrous sulfate  325 mg Oral Daily  ? finasteride  5 mg Oral Daily  ? gabapentin  100 mg Oral BID  ? insulin aspart  0-15 Units Subcutaneous TID WC  ? insulin aspart  0-5 Units Subcutaneous QHS  ? insulin glargine-yfgn  45 Units Subcutaneous BID  ? levothyroxine  25 mcg Oral QAC breakfast  ? lipase/protease/amylase  36,000 Units Oral BID WC  ? mirtazapine  15 mg Oral QHS  ? montelukast  10 mg Oral QHS  ? multivitamin  1 tablet Oral Daily  ? neomycin-polymyxin-hydrocortisone  3 drop Both EARS QHS  ? pantoprazole  40 mg Oral Daily  ? potassium chloride SA  20 mEq Oral BID  ? spironolactone  25 mg Oral Daily  ? tamsulosin  0.4 mg Oral QPC supper  ? torsemide  20 mg Oral Daily  ? ?Continuous Infusions: ? ceFEPime (MAXIPIME) IV 2 g (03/23/22 1620)  ? metronidazole 500 mg (03/23/22 1352)  ? vancomycin 1,500 mg (03/23/22 1040)  ? ?No problems updated. ?Abscess of right foot. ? ?A & P  ?Abscess/diabetic ulcer of the right foot: Foot is swollen, erythematous, and draining pus. The patient is receiving IV cefepime and vancomycin. Orthopedic surgery was consulted. They have spoken with Dr. Sharol Given who has asked that the patient be transferred to [facility] for evaluation and treatment by his service. Transfer orders are in place. I have discussed this patient with bed control. It seems that the patient will not transfer until Monday. ? ?DM II with diabetic neuropathy on chronic insulin use: The patient's glucoses were controlled on lantus 45 units bid with FSBS and SSI. Hemoglobin A1c is 9.5. Continue  neurontin. ? ?AKI: Patient's baseline creatinine is unknown, but he was admitted with creatinine of 1.36. It has come down to 1.03 this morning after IV fluids. Monitor creatinine, electrolytes, and volume status. ? ?Hypertension: Blood pressures are normotensive on only proscar. Monitor. ? ?Coronary artery disease with history of myocardial infarction: Continue aspirin, lipitor, metoprolol, and plavix. No chest pain. Monitor. ? ?I have seen and examined this patient myself. I have spent 32 minutes in his evaluation and care. ? ?DVT prophylaxis: Lovenox ?Code Status: Full Code ?Family Communication: Wife is at bedside ?Disposition Plan: Transfer to [facility]  ? ? , DO ?[facility] ?Direct contact: see  [email or web address]  ?7PM-7AM contact night coverage as above ?03/23/2022, 6:59 PM  LOS: 1 day  ? LOS: 3 days  ? ? ? ? ? ? ?  ?

## 2022-03-24 DIAGNOSIS — L089 Local infection of the skin and subcutaneous tissue, unspecified: Secondary | ICD-10-CM | POA: Diagnosis not present

## 2022-03-24 LAB — CBC WITH DIFFERENTIAL/PLATELET
Abs Immature Granulocytes: 0.09 10*3/uL — ABNORMAL HIGH (ref 0.00–0.07)
Basophils Absolute: 0.1 10*3/uL (ref 0.0–0.1)
Basophils Relative: 1 %
Eosinophils Absolute: 0.2 10*3/uL (ref 0.0–0.5)
Eosinophils Relative: 3 %
HCT: 33.2 % — ABNORMAL LOW (ref 39.0–52.0)
Hemoglobin: 10.4 g/dL — ABNORMAL LOW (ref 13.0–17.0)
Immature Granulocytes: 1 %
Lymphocytes Relative: 26 %
Lymphs Abs: 2.4 10*3/uL (ref 0.7–4.0)
MCH: 30.6 pg (ref 26.0–34.0)
MCHC: 31.3 g/dL (ref 30.0–36.0)
MCV: 97.6 fL (ref 80.0–100.0)
Monocytes Absolute: 0.8 10*3/uL (ref 0.1–1.0)
Monocytes Relative: 8 %
Neutro Abs: 5.7 10*3/uL (ref 1.7–7.7)
Neutrophils Relative %: 61 %
Platelets: 299 10*3/uL (ref 150–400)
RBC: 3.4 MIL/uL — ABNORMAL LOW (ref 4.22–5.81)
RDW: 15.2 % (ref 11.5–15.5)
WBC: 9.3 10*3/uL (ref 4.0–10.5)
nRBC: 0 % (ref 0.0–0.2)

## 2022-03-24 LAB — BASIC METABOLIC PANEL
Anion gap: 8 (ref 5–15)
BUN: 27 mg/dL — ABNORMAL HIGH (ref 8–23)
CO2: 25 mmol/L (ref 22–32)
Calcium: 9.4 mg/dL (ref 8.9–10.3)
Chloride: 107 mmol/L (ref 98–111)
Creatinine, Ser: 1.36 mg/dL — ABNORMAL HIGH (ref 0.61–1.24)
GFR, Estimated: 59 mL/min — ABNORMAL LOW (ref >60)
Glucose, Bld: 189 mg/dL — ABNORMAL HIGH (ref 70–99)
Potassium: 4.1 mmol/L (ref 3.5–5.1)
Sodium: 140 mmol/L (ref 135–145)

## 2022-03-24 LAB — GLUCOSE, CAPILLARY
Glucose-Capillary: 217 mg/dL — ABNORMAL HIGH (ref 70–99)
Glucose-Capillary: 223 mg/dL — ABNORMAL HIGH (ref 70–99)
Glucose-Capillary: 129 mg/dL — ABNORMAL HIGH (ref 70–99)
Glucose-Capillary: 110 mg/dL — ABNORMAL HIGH (ref 70–99)

## 2022-03-24 MED ORDER — DORZOLAMIDE HCL-TIMOLOL MAL 2-0.5 % OP SOLN
1.0000 [drp] | Freq: Two times a day (BID) | OPHTHALMIC | Status: DC
  Administered 2022-03-24 – 2022-04-02 (×18): 1 [drp] via OPHTHALMIC
  Filled 2022-03-24 (×2): qty 10

## 2022-03-24 NOTE — Plan of Care (Signed)
  Problem: Activity: Goal: Risk for activity intolerance will decrease Outcome: Progressing   Problem: Nutrition: Goal: Adequate nutrition will be maintained Outcome: Progressing   Problem: Coping: Goal: Level of anxiety will decrease Outcome: Progressing   Problem: Elimination: Goal: Will not experience complications related to bowel motility Outcome: Progressing   Problem: Safety: Goal: Ability to remain free from injury will improve Outcome: Progressing   

## 2022-03-24 NOTE — Progress Notes (Signed)
?PROGRESS NOTE ? ?Johnathan Keith YNW:[id number] DOB: 02-Jan-1961 DOA: 03/20/2022 ?PCP: Algis Greenhouse, MD ? ?Brief History   ?Johnathan Keith is a 61 y.o. male with medical history significant for coronary artery disease status post PCI with stenting on DAPT, essential hypertension, CVA, HFrEF 40 to 45%, asthma, COPD, GERD, gout, type 2 diabetes, diabetic polyneuropathy, wheelchair-bound for the past 1 year, who initially presented from home to [facility] due to concern for right calcaneus infection.  His wife noted foul-smelling odor.  His wife at bedside states she has been doing the wound change dressing and noted foul-smelling and dark tissue.  She reports prior evaluation by vascular surgery, no lack of blood flow in the right foot.  Upon presentation to the ED at [facility], the patient is afebrile and vital signs are stable.  CT right lower extremity revealed possible abscess involving his right calcaneus, measuring approximately 4 x 2 x 1 cm.  No CT evidence of osteomyelitis.  Lab studies remarkable for ESR 29, CRP 55.9.  WBC 12.1, hemoglobin 9.8/hematocrit 30.9, BUN 33, creatinine 1.30.  Patient received ampicillin/sulbactam in the ED and 1 dose of IV vancomycin prior to transfer to [facility], room 1323.  Patient was accepted and admitted by hospitalist service, [facility]. ? ?He has been admitted and placed on IV cefepime and vancomycin. Orthopedic surgery was consulted. They have spoken with Dr. Sharol Given who has asked that the patient be transferred to [facility] for evaluation and treatment by his service. Transfer orders are in place. ? ?The patient will transfer to [facility] today. ? ?Consultants  ?Dr. Sharol Given ? ?Procedures  ?None ? ?Antibiotics  ? ?Anti-infectives (From admission, onward)  ? ? Start     Dose/Rate Route Frequency Ordered Stop  ? 03/23/22 0900  vancomycin (VANCOREADY) IVPB 1500 mg/300 mL       ? 1,500 mg ?150 mL/hr over 120 Minutes Intravenous Every 24 hours 03/22/22 1020    ? 03/22/22  0600  vancomycin (VANCOREADY) IVPB 1750 mg/350 mL  Status:  Discontinued       ? 1,750 mg ?175 mL/hr over 120 Minutes Intravenous Every 24 hours 03/21/22 0100 03/22/22 1020  ? 03/21/22 0200  metroNIDAZOLE (FLAGYL) IVPB 500 mg       ? 500 mg ?100 mL/hr over 60 Minutes Intravenous Every 12 hours 03/20/22 2307    ? 03/21/22 0000  ceFEPIme (MAXIPIME) 2 g in sodium chloride 0.9 % 100 mL IVPB       ? 2 g ?200 mL/hr over 30 Minutes Intravenous Every 8 hours 03/20/22 2305    ? 03/20/22 2315  vancomycin (VANCOREADY) IVPB 2000 mg/400 mL       ? 2,000 mg ?200 mL/hr over 120 Minutes Intravenous  Once 03/20/22 2305 03/21/22 0229  ? ?  ? ?Subjective  ?The patient is resting comfortably. No acute distress. ? ?Objective  ? ?Vitals:  ?Vitals:  ? 03/24/22 0610 03/24/22 0826  ?BP: 119/76   ?Pulse: 73   ?Resp: 15   ?Temp:    ?SpO2: 100% 99%  ? ? ?Exam: ? ?Constitutional:  ?Appears calm and comfortable ?Respiratory:  ?CTA bilaterally, no w/r/r.  ?Respiratory effort normal. No retractions or accessory muscle use ?Cardiovascular:  ?RRR, no m/r/g ?No LE extremity edema   ?Normal pedal pulses ?Abdomen:  ?Abdomen appears normal; no tenderness or masses ?No hernias ?No HSM ?Musculoskeletal:  ?Digits/nails BUE: no clubbing, cyanosis, petechiae, infection ?exam of joints, bones, muscles of at least one of following: head/neck, RUE, LUE, RLE,  LLE   ?strength and tone normal, no atrophy, no abnormal movements ?No tenderness, masses ?Normal ROM, no contractures  ?gait and station ?Skin:  ?No rashes, lesions, ulcers ?palpation of skin: no induration or nodules ?Neurologic:  ?CN 2-12 intact ?Sensation all 4 extremities intact ?Psychiatric:  ?Mental status ?Mood, affect appropriate ?Orientation to person, place, time  ?judgment and insight appear intact   ? ? ?I have personally reviewed the following:  ? ?Today's Data  ?Vitals ? ?Lab Data  ?CBC ?BMP ? ?Micro Data  ?Blood culture x 2 ? ?Imaging  ?X-ray of right foot. ? ?Cardiology Data  ? ? ?Other  Data  ? ? ?Scheduled Meds: ? allopurinol  100 mg Oral Daily  ? aspirin EC  81 mg Oral Daily  ? atorvastatin  80 mg Oral Daily  ? bethanechol  10 mg Oral TID  ? budesonide  0.25 mg Nebulization BID  ? carbidopa-levodopa  0.5 tablet Oral BID  ? cholecalciferol  1,000 Units Oral Q1200  ? clopidogrel  75 mg Oral Daily  ? dorzolamide-timolol  1 drop Both Eyes BID  ? enoxaparin (LOVENOX) injection  40 mg Subcutaneous QHS  ? ferrous sulfate  325 mg Oral Daily  ? finasteride  5 mg Oral Daily  ? gabapentin  100 mg Oral BID  ? insulin aspart  0-15 Units Subcutaneous TID WC  ? insulin aspart  0-5 Units Subcutaneous QHS  ? insulin glargine-yfgn  45 Units Subcutaneous BID  ? levothyroxine  25 mcg Oral QAC breakfast  ? lipase/protease/amylase  36,000 Units Oral BID WC  ? mirtazapine  15 mg Oral QHS  ? montelukast  10 mg Oral QHS  ? multivitamin  1 tablet Oral Daily  ? neomycin-polymyxin-hydrocortisone  3 drop Both EARS QHS  ? pantoprazole  40 mg Oral Daily  ? potassium chloride SA  20 mEq Oral BID  ? spironolactone  25 mg Oral Daily  ? tamsulosin  0.4 mg Oral QPC supper  ? torsemide  20 mg Oral Daily  ? ?Continuous Infusions: ? ceFEPime (MAXIPIME) IV 2 g (03/24/22 8676)  ? metronidazole Stopped (03/24/22 0304)  ? vancomycin 1,500 mg (03/24/22 1950)  ? ?No problems updated. ?Abscess of right foot. ? ?A & P  ?Abscess/diabetic ulcer of the right foot: Foot is swollen, erythematous, and draining pus. The patient is receiving IV cefepime and vancomycin. Orthopedic surgery was consulted. They have spoken with Dr. Sharol Given who has asked that the patient be transferred to [facility] for evaluation and treatment by his service. Transfer orders are in place. I have discussed this patient with bed control. It seems that the patient will not transfer until Monday. ? ?DM II with diabetic neuropathy on chronic insulin use: The patient's glucoses were controlled on lantus 45 units bid with FSBS and SSI. Hemoglobin A1c is 9.5. Continue  neurontin. ? ?AKI: Patient's baseline creatinine is unknown, but he was admitted with creatinine of 1.36. It did come down to 1.03, but now is again up to 1.36. Monitor creatinine, electrolytes, and volume status. ? ?Glaucoma: Cosopt that as recently prescribed for the patient by ophthalmology has been restarted. ? ?Hypertension: Blood pressures are normotensive on only proscar. Monitor. ? ?Coronary artery disease with history of myocardial infarction: Continue aspirin, lipitor, metoprolol, and plavix. No chest pain. Monitor. ? ?I have seen and examined this patient myself. I have spent 36 minutes in his evaluation and care. ? ?DVT prophylaxis: Lovenox ?Code Status: Full Code ?Family Communication: Wife is at bedside ?Disposition Plan:  Transfer to [employer] [facility]  ? ?Ajanay Farve, DO ?[facility] ?Direct contact: see [email or web address]  ?7PM-7AM contact night coverage as above ?03/24/2022, 11:39 AM  LOS: 1 day  ? LOS: 4 days  ? ? ? ? ? ? ?  ?

## 2022-03-25 ENCOUNTER — Inpatient Hospital Stay (HOSPITAL_COMMUNITY): Payer: No Typology Code available for payment source

## 2022-03-25 DIAGNOSIS — L8990 Pressure ulcer of unspecified site, unspecified stage: Secondary | ICD-10-CM | POA: Diagnosis present

## 2022-03-25 DIAGNOSIS — L089 Local infection of the skin and subcutaneous tissue, unspecified: Secondary | ICD-10-CM | POA: Diagnosis not present

## 2022-03-25 DIAGNOSIS — I6389 Other cerebral infarction: Secondary | ICD-10-CM | POA: Diagnosis not present

## 2022-03-25 LAB — ECHOCARDIOGRAM COMPLETE
AR max vel: 2.53 cm2
AV Area VTI: 2.59 cm2
AV Area mean vel: 2.49 cm2
AV Mean grad: 4 mmHg
AV Peak grad: 7.7 mmHg
Ao pk vel: 1.39 m/s
Area-P 1/2: 2.07 cm2
Height: 66 in
MV VTI: 2.45 cm2
P 1/2 time: 558 msec
S' Lateral: 2.5 cm
Weight: 3287.5 oz

## 2022-03-25 LAB — GLUCOSE, CAPILLARY
Glucose-Capillary: 149 mg/dL — ABNORMAL HIGH (ref 70–99)
Glucose-Capillary: 143 mg/dL — ABNORMAL HIGH (ref 70–99)
Glucose-Capillary: 137 mg/dL — ABNORMAL HIGH (ref 70–99)
Glucose-Capillary: 166 mg/dL — ABNORMAL HIGH (ref 70–99)

## 2022-03-25 IMAGING — MR MR HEEL *R* W/O CM
5 series · 40 of 40 positions shown · non-contrast
Comparison: None.

CLINICAL DATA: Osteomyelitis suspected, ankle, no prior imaging

EXAM:
MR OF THE RIGHT HEEL WITHOUT CONTRAST
TECHNIQUE: Multiplanar, multisequence MR imaging of the right was performed. No
intravenous contrast was administered.

[Series 5: T1 · coronal · right · 3.0mm · 0.47mm/px · 8 of 46 slices shown (1 of 2)]
[im 1/46]
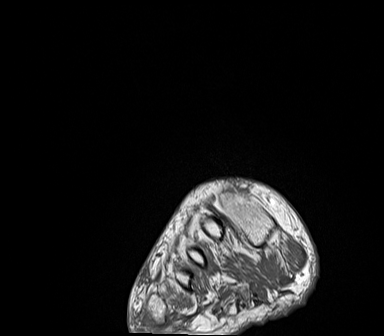
[im 7/46]
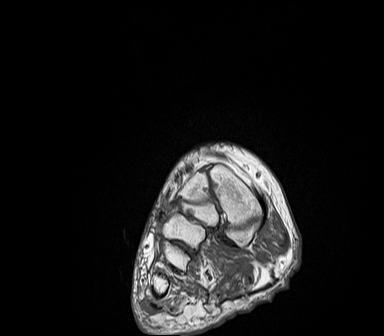
[im 13/46]
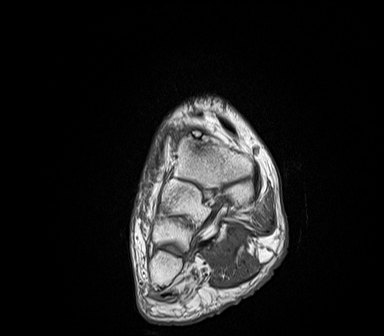
[im 20/46]
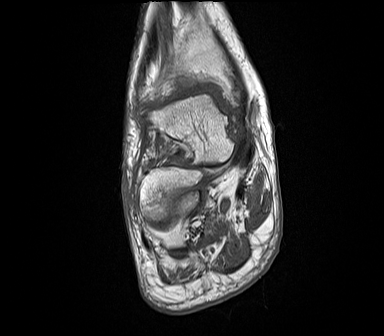
[im 26/46]
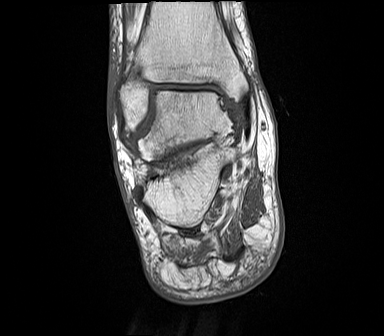
[im 33/46]
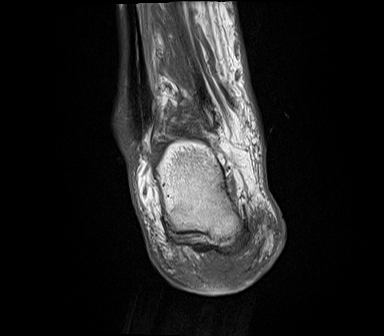
[im 39/46]
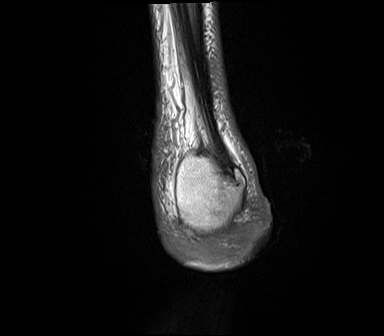
[im 46/46]
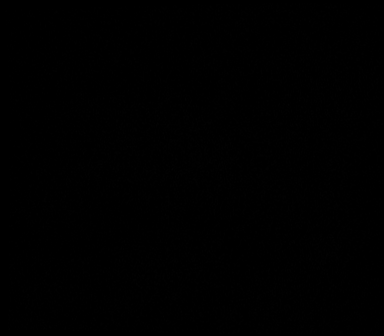

[Series 6: T1 · axial · right · 3.0mm · 0.47mm/px · z∈[-76,+111]mm · 9 of 48 slices shown (2 of 2)]
[im 1/48]
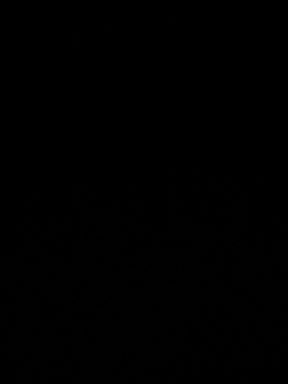
[im 6/48]
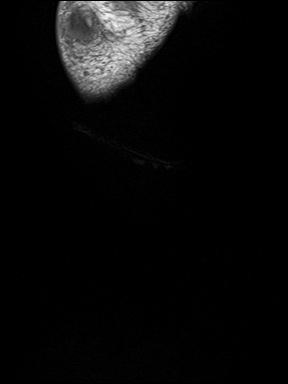
[im 12/48]
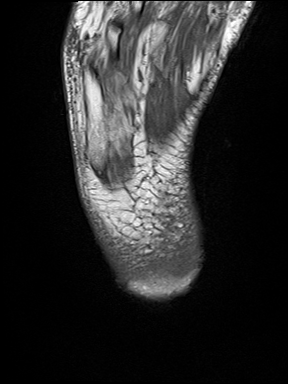
[im 18/48]
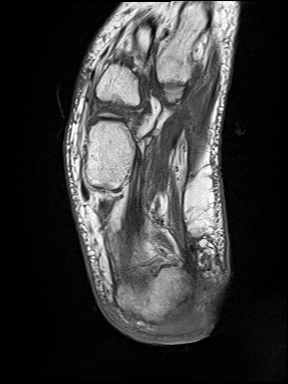
[im 24/48]
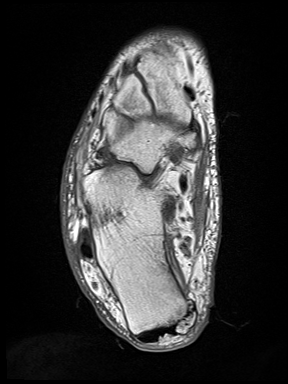
[im 30/48]
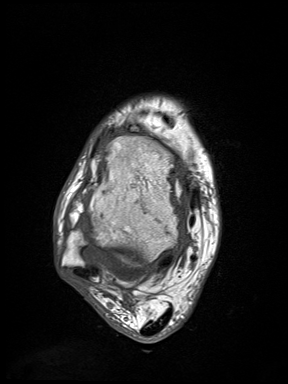
[im 36/48]
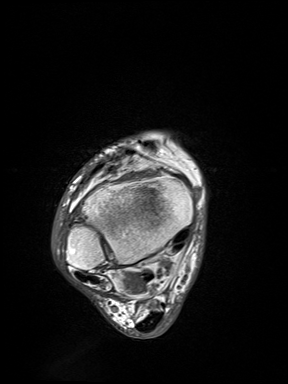
[im 42/48]
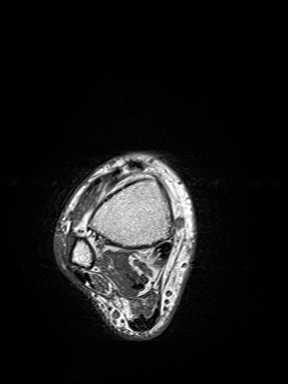
[im 48/48]
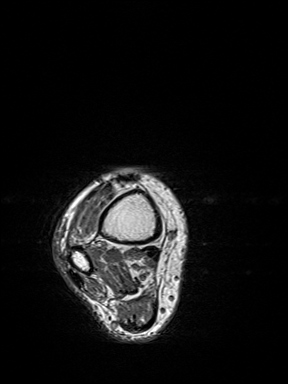

[Series 7: T2 fat-sat · axial · right · 3.0mm · 0.54mm/px · z∈[-76,+111]mm · 9 of 48 slices shown (1 of 2)]
[im 1/48]
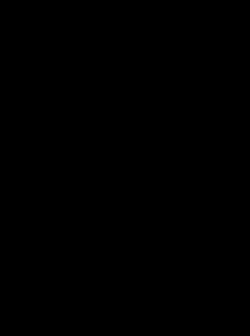
[im 6/48]
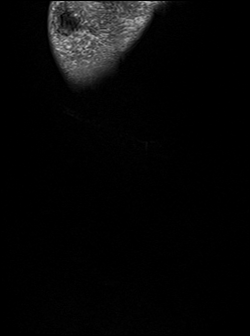
[im 12/48]
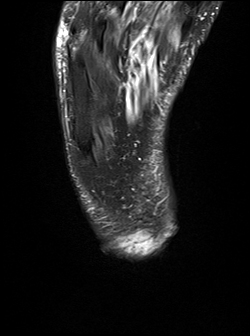
[im 18/48]
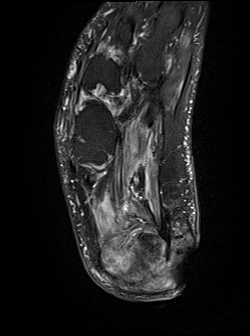
[im 24/48]
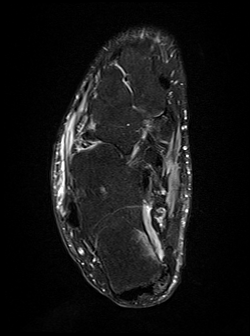
[im 30/48]
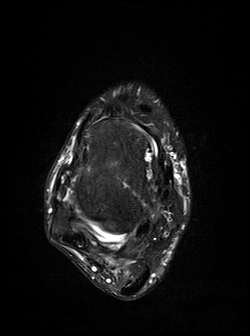
[im 36/48]
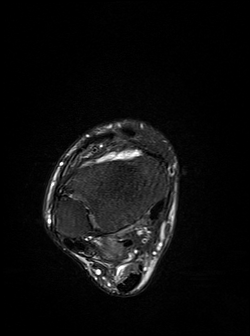
[im 42/48]
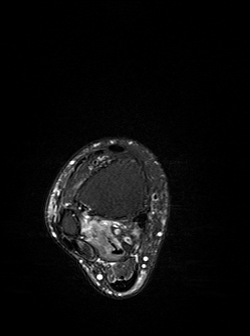
[im 48/48]
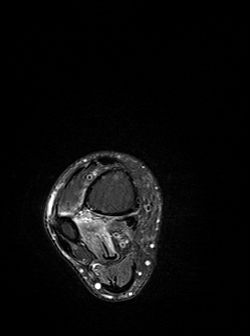

[Series 8: T2 fat-sat · coronal · right · 3.0mm · 0.49mm/px · 8 of 46 slices shown (2 of 2)]
[im 1/46]
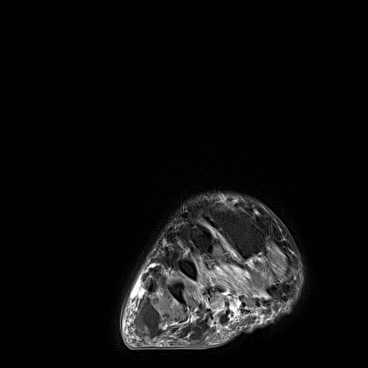
[im 7/46]
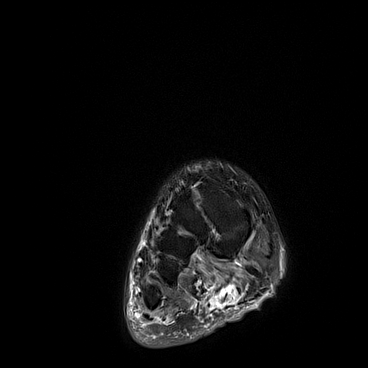
[im 13/46]
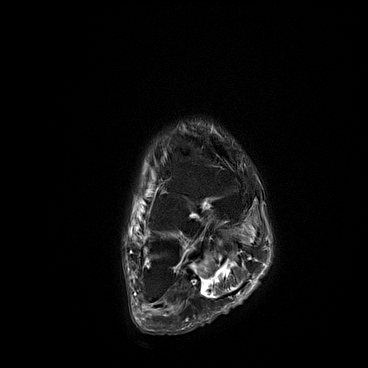
[im 20/46]
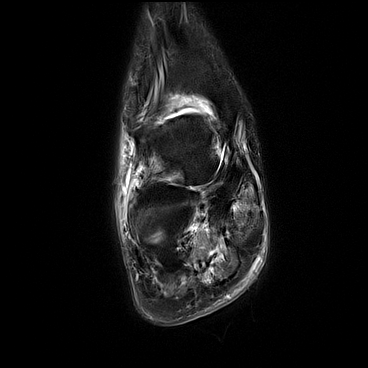
[im 26/46]
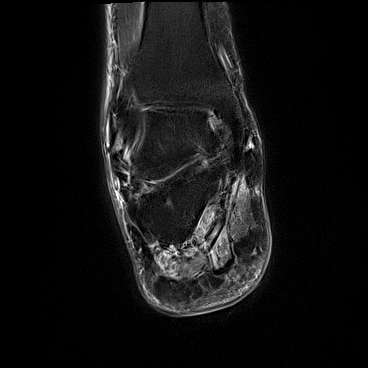
[im 33/46]
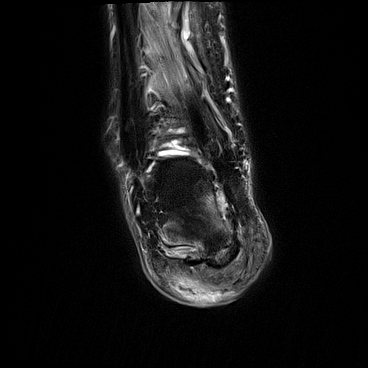
[im 39/46]
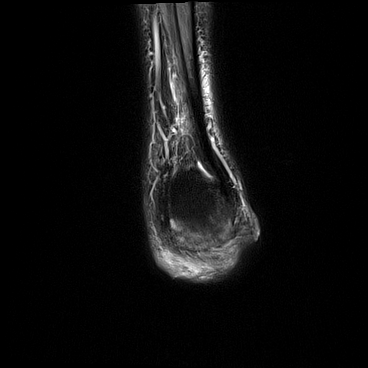
[im 46/46]
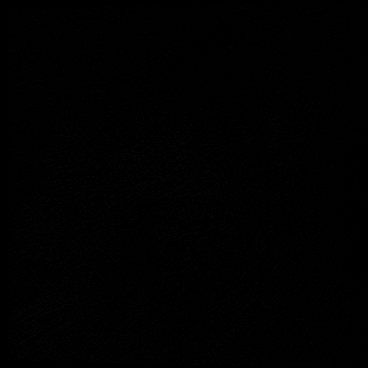

[Series 9: STIR · sagittal · right · 3.0mm · 0.70mm/px · 6 of 31 slices shown]
[im 1/31]
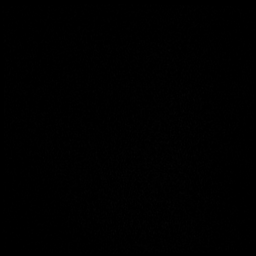
[im 7/31]
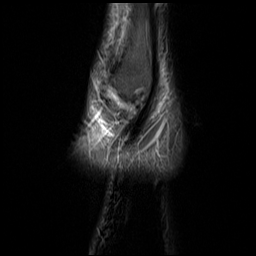
[im 13/31]
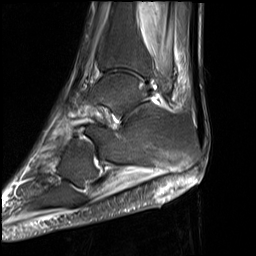
[im 19/31]
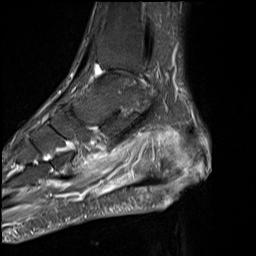
[im 25/31]
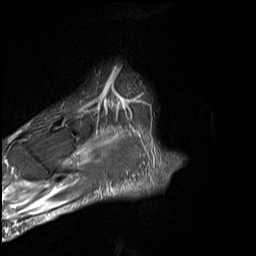
[im 31/31]
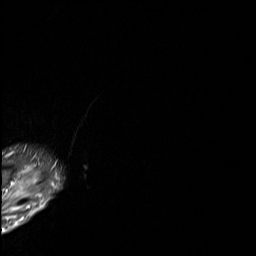

[40 of 40 positions shown; findings below may reference images not displayed]

FINDINGS: Bones/Joint/Cartilage

There is cortical disruption with underlying marrow edema and focal
low T1 signal within the medial posterior calcaneus (series 6, image
30). There is marrow edema extending along the majority of the
plantar aspect of the calcaneus.

Ligaments

The ATFL and calcaneofibular ligaments are likely intact. The PTFL
is intact. The deltoid and spring ligament are likely intact.

Muscles and Tendons

There is intramuscular edema and muscle atrophy as is commonly seen
in diabetics. The Achilles tendon is intact. The plantar fascia is
intact.

Soft tissues

There is a soft tissue ulcer along the medial posterior heel, with
adjacent ill-defined soft tissue edema. No well-defined/drainable
fluid collection.
IMPRESSION: Soft tissue ulcer along the medial posterior heel with underlying
focal osteomyelitis of the calcaneus. Ill-defined adjacent soft
tissue edema without well-defined/drainable abscess.

## 2022-03-25 IMAGING — MR MR MRA HEAD W/O CM
1 series · 19 of 48 positions shown · non-contrast
Comparison: Most recent brain MRI SCGM [DATE].
Most recent head CT [DATE].

CLINICAL DATA: 60-year-old male with neurologic deficit. Left eye
vision loss. History of right eye retinal artery occlusion. Weakness
right greater than left.

EXAM:
MRA HEAD WITHOUT CONTRAST
TECHNIQUE: Angiographic images of the Circle of Willis were acquired using MRA
technique without intravenous contrast.

[Series 5: 3d cow · axial · 0.5mm · 0.41mm/px · z∈[-34,+39]mm · 19 of 172 slices shown]
[im 1/172]
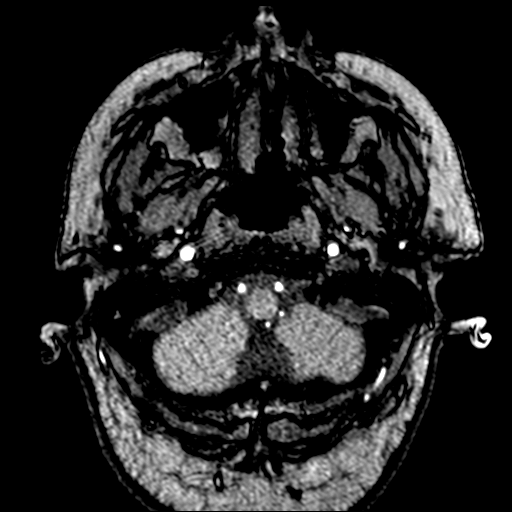
[im 4/172]
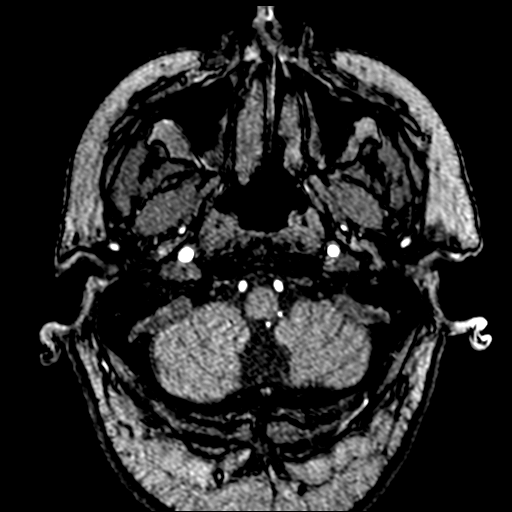
[im 8/172]
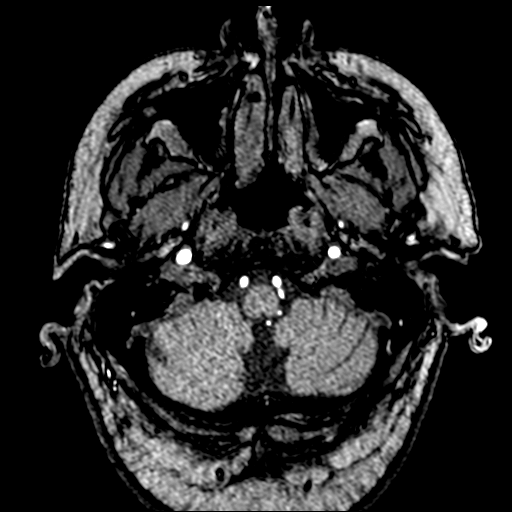
[im 11/172]
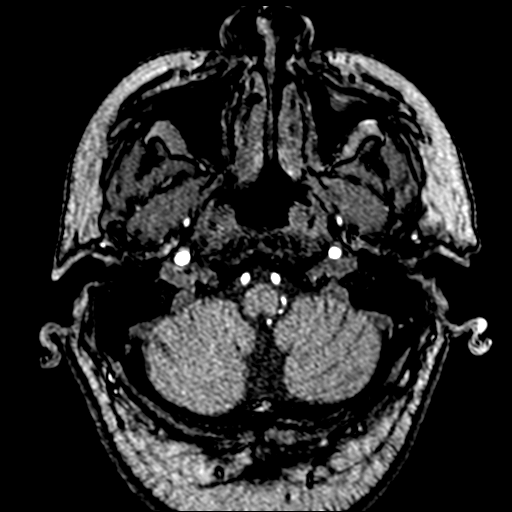
[im 15/172]
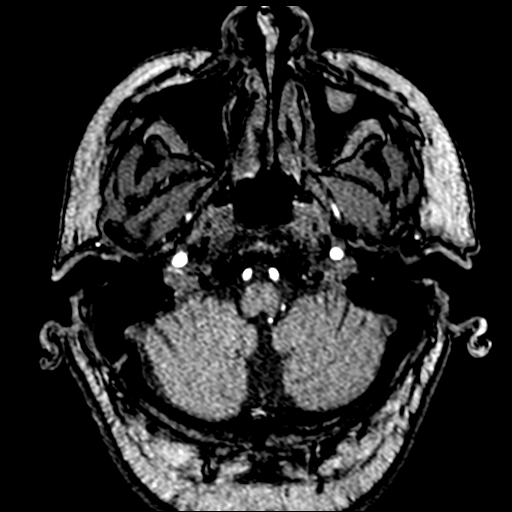
[im 19/172]
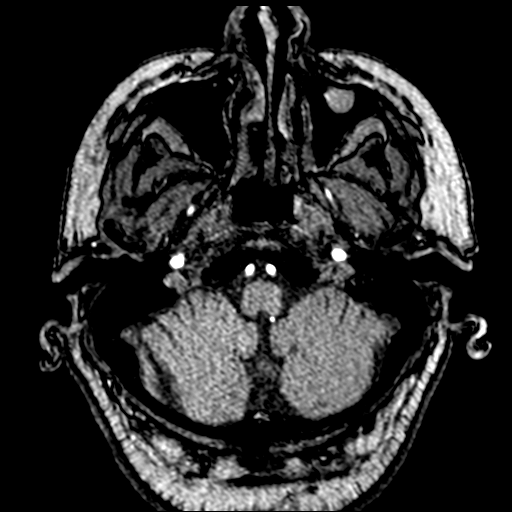
[im 22/172]
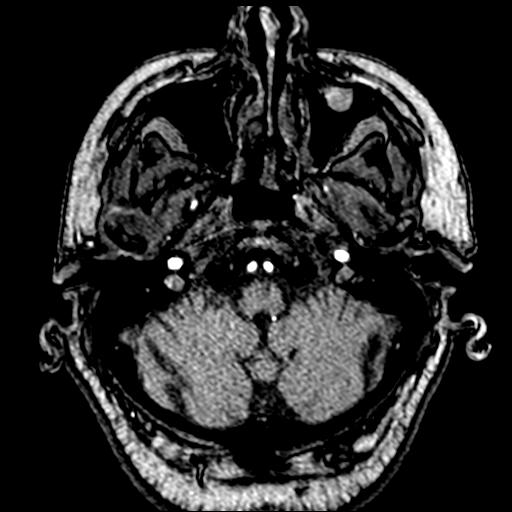
[im 26/172]
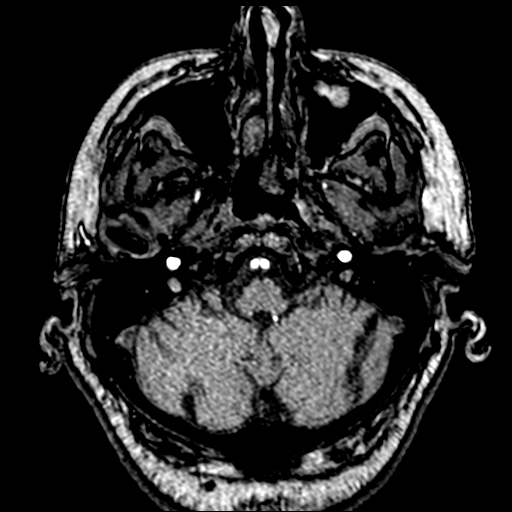
[im 30/172]
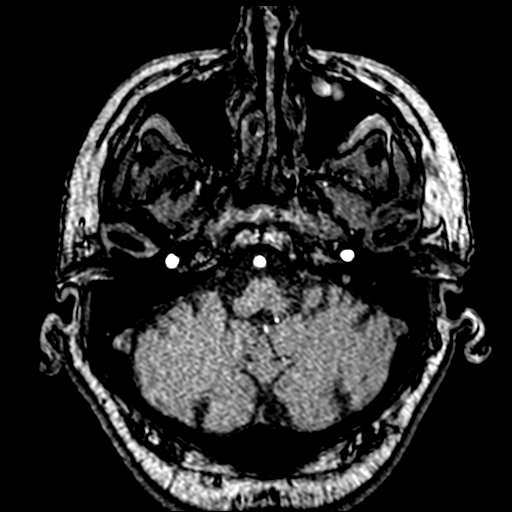
[im 33/172]
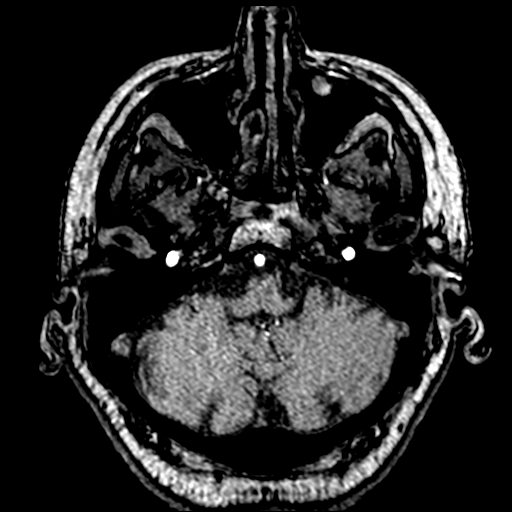
[im 37/172]
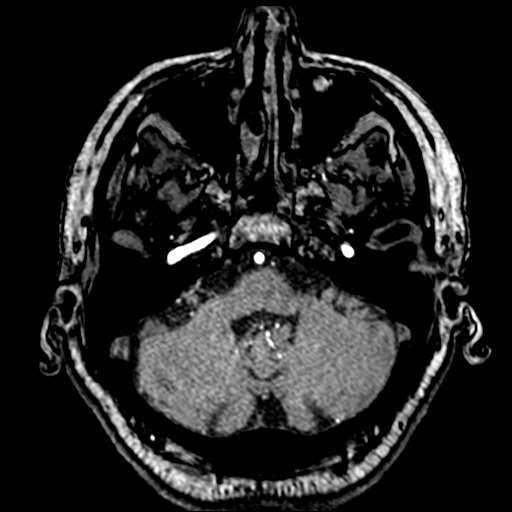
[im 55/172]
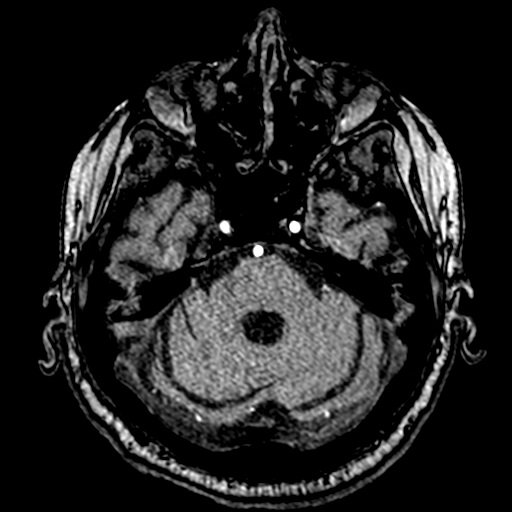
[im 77/172]
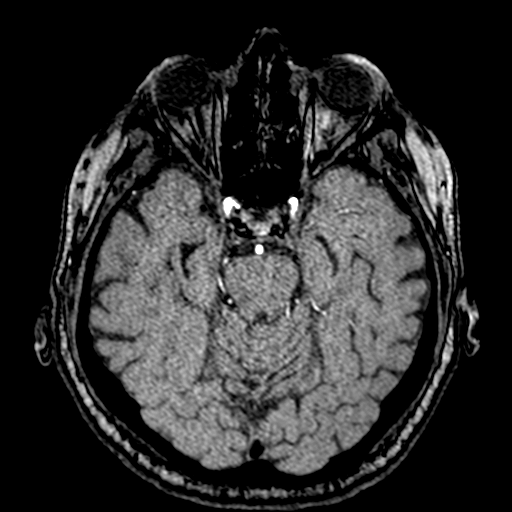
[im 88/172]
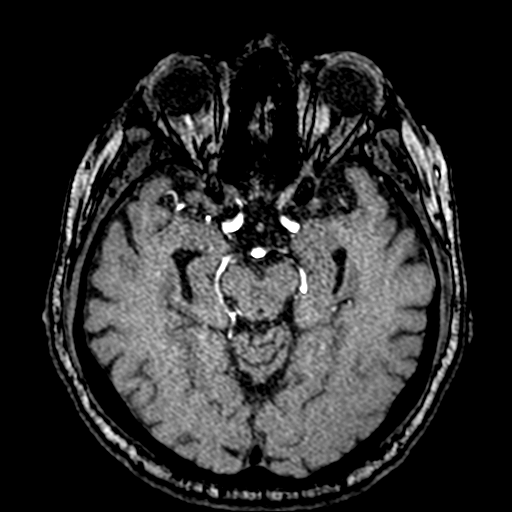
[im 99/172]
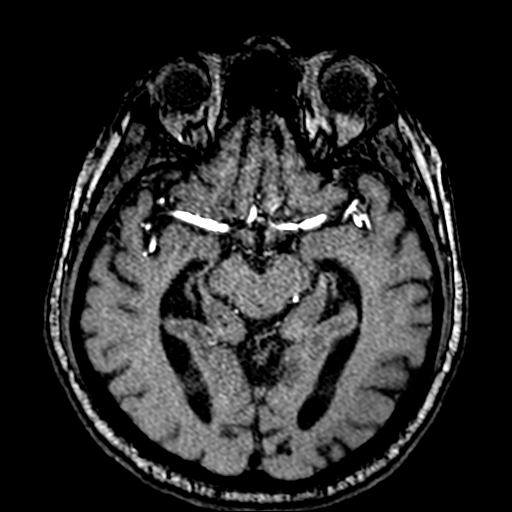
[im 121/172]
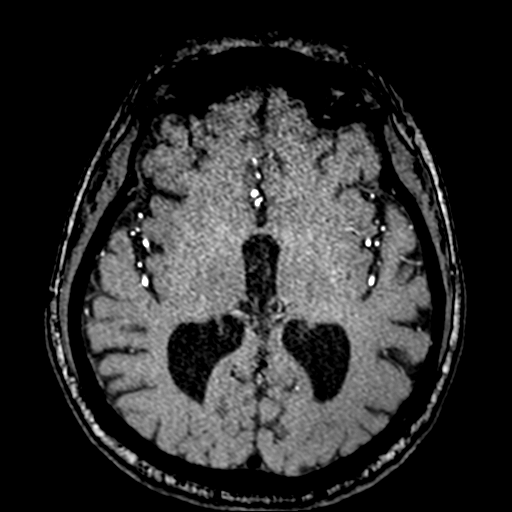
[im 142/172]
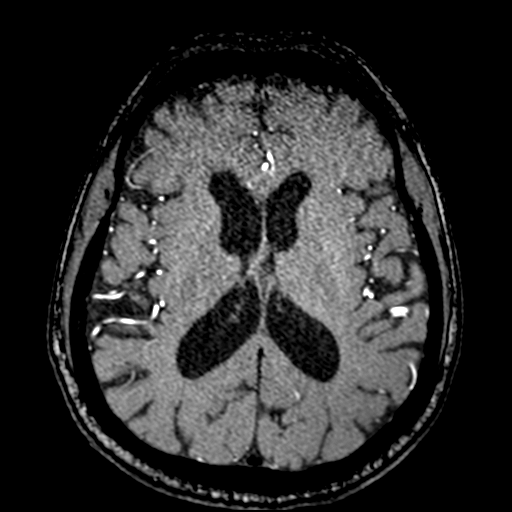
[im 146/172]
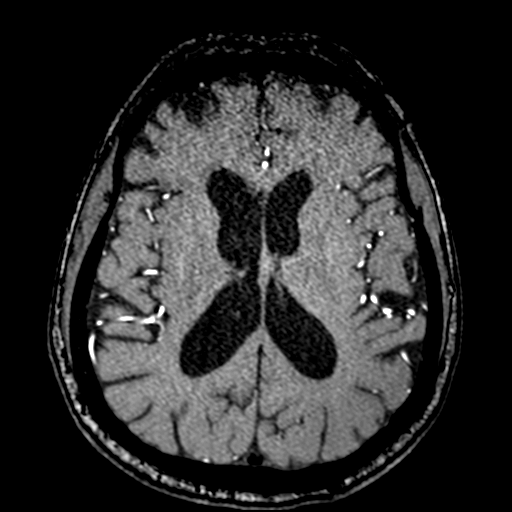
[im 164/172]
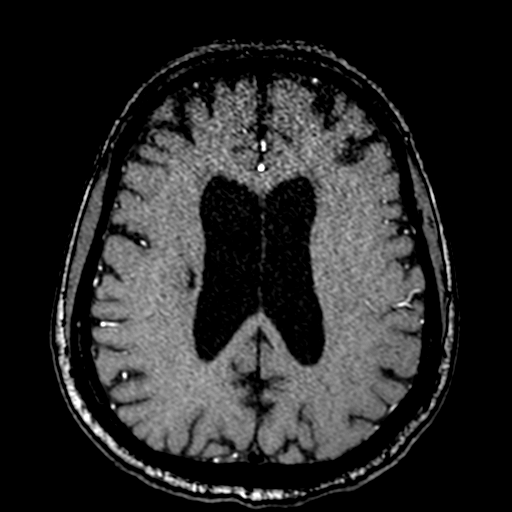

[19 of 48 positions shown; findings below may reference images not displayed]

FINDINGS: Anterior circulation: Antegrade flow in both ICA siphons. Bilateral
supraclinoid segment irregularity and stenosis, up to moderate on
the left near the anterior genu and mild on the right (series [1U],
image 14). But both carotid termini, MCA and ACA origins remain
patent. And both ophthalmic artery origins are patent (series 5,
image 85).

Normal anterior communicating artery, visible ACA branches. Left MCA
M1 segment bifurcates early without stenosis. Right MCA M1 segment
and bifurcation is patent without stenosis. Visible bilateral MCA
branches are within normal limits.

Posterior circulation: Antegrade flow in the posterior circulation
with codominant distal vertebral arteries. No distal vertebral or
basilar stenosis. Left PICA origin is visible and patent. Right AICA
may be dominant. Both AICA origins, SCA and PCA origins are patent.
Posterior communicating arteries are diminutive or absent. Bilateral
PCA branches are patent with mild right P1 and mild to moderate left
P2 segment irregularity and stenosis (series [1U], image 3).

Anatomic variants: None.

Other: No intracranial mass effect or ventriculomegaly. Brain volume
appears stable since last year. Evidence of chronic right corona
radiata lacunar infarct again noted.
IMPRESSION: 1. No large vessel occlusion.

2. Bilateral ICA siphon atherosclerosis with up to moderate stenosis
of the Left supraclinoid ICA, but both ophthalmic artery origins
remain patent.

3. Mild to moderate bilateral PCA P1/P2 atherosclerosis and
stenosis, greater on the Left.

## 2022-03-25 NOTE — Progress Notes (Signed)
Echocardiogram ?2D Echocardiogram has been performed. ? ?Johnathan Keith ?03/25/2022, 2:41 PM ?

## 2022-03-25 NOTE — Progress Notes (Signed)
Pharmacy Antibiotic Note ? ?Johnathan Keith is a 61 y.o. male admitted on 03/20/2022 with a right foot infection/abscess - transferred from [facility]. Pharmacy consulted to dose broad spectrum antibiotics (vancomycin + cefepime). Purulent wound - vascular has been consulted.Transfered to [facility] for care and evaluation by Dr. Lajoyce Corners.  ? ?Today, 03/25/22 ?SCr up to 1.33, CrCl ~60 mL/min ?Afebrile ? ?Plan: ?Continue Cefepime 2 g IV q8h + metronidazole 500 mg IV q12h ?Continue vancomycin dose at 1500 mg IV q24h for estimated AUC of 484. Goal vancomycin AUC is 400-550. ?Follow renal function, check levels at steady state as needed ? ?Height: 5\' 6"  (167.6 cm) ?Weight: 93.2 kg (205 lb 7.5 oz) ?IBW/kg (Calculated) : 63.8 ? ?Temp (24hrs), Avg:98.3 ?F (36.8 ?C), Min:98.2 ?F (36.8 ?C), Max:98.4 ?F (36.9 ?C) ? ?Recent Labs  ?Lab 03/20/22 ?2116 03/21/22 ?03/23/22 03/22/22 ?03/24/22 03/23/22 ?03/25/22 03/24/22 ?03/26/22  ?WBC 14.9* 12.6* 12.1* 9.9 9.3  ?CREATININE 1.34* 1.03 1.37* 1.39* 1.36*  ? ?  ?Estimated Creatinine Clearance: 60.2 mL/min (A) (by C-G formula based on SCr of 1.36 mg/dL (H)).   ? ?Allergies  ?Allergen Reactions  ? Metformin   ?  Other reaction(s): GI Upset (intolerance) ?Even with XR  ? ? ?Antimicrobials this admission: ?cefepime 4/12 >>  ?vancomycin 4/14 >>  ?Metronidazole 4/13 >> ? ?Dose adjustments this admission: ? ?Microbiology results: ?4/13 BCx: ngtd ? ?Dody Smartt A. 5/13, PharmD, BCPS, FNKF ?Clinical Pharmacist ?[place] ?Please utilize Amion for appropriate phone number to reach the unit pharmacist [facility]) ? ?03/25/2022 8:38 AM ?

## 2022-03-25 NOTE — Progress Notes (Signed)
?PROGRESS NOTE ? ? ? ?Johnathan Keith  [id number] DOB: [birth year] DOA: 03/20/2022 ?PCP: Algis Greenhouse, MD  ? ? ?Brief Narrative:  ?61 year old gentleman with severe, multiple underlying comorbidities with history of coronary artery disease status post PCI now on dual antiplatelet therapy, history of stroke with generalized weakness right more than left, wheelchair-bound, hypertension, COPD, GERD, type 2 diabetes on insulin and diabetic polyneuropathy who was suffering from right heel nonhealing wound for about a year, seen at [facility] and found to have localized abscess and sent to [facility].   he was admitted to [facility] long for last 4 days, recommended transfer to [facility] for surgical intervention. ?Also complains of gradual vision loss in the left eye.  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 ? ? ?Assessment & Plan: ?  ?Right heel abscess and cellulitis, nonhealing wound: ?Continue vancomycin cefepime and Flagyl.  Apparently recent vascular studies were normal.  Patient may need radical debridement/amputation to clean margin given nonfunctional limb and baseline wheelchair-bound. ?MRI heel today, seen by Dr. Sharol Given and anticipate surgery 4/19. ? ?Type 2 diabetes with diabetic nephropathy, long-term insulin use with hyperglycemia: Hemoglobin A1c 9.5.  Currently on Lantus and prandial insulin.  Blood sugars acceptable today. ? ?History of stroke, generalized debility, right hemiparesis, right sided blindness: ?No neurological deficit but patient complained of gradual loss of vision on the left eye also. ?Seen by ophthalmology on 4/6 in the office, notes were reviewed.  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 ?Continue Cosopt eyedrops. ?Ophthalmologist had recommended echocardiogram and angiogram for retinal artery stenosis, will order MRA of the brain today to see any occlusive lesion/thrombus.  Echocardiogram to look for any thrombus. ?This is likely progressive  visual loss, unsure any reversibility.  Discussed this with patient and wife at the bedside. ? ?Essential hypertension: Blood pressure stable on Proscar.  Continue. ? ?coronary artery disease with history of MI: On aspirin, Plavix, Lipitor and metoprolol.  Orthopedics intervention planned. ? ?Abnormal kidney functions: Baseline not available.  Creatinine has remained at about the same range since admission.  We will continue to monitor on treatment. ? ? ?DVT prophylaxis: enoxaparin (LOVENOX) injection 40 mg Start: 03/21/22 2200 ? ? ?Code Status: Full code ?Family Communication: Wife at the bedside ?Disposition Plan: Status is: Inpatient ?Remains inpatient appropriate because: Heel ulcer, needs inpatient surgery ?  ? ? ?Consultants:  ?Dr. Sharol Given ? ?Procedures:  ?None ? ?Antimicrobials:  ?Vancomycin, cefepime and Flagyl 4/12--- ? ? ?Subjective: ?Patient seen and examined.  Wife at the bedside.  Patient himself tells me he has no pain. ?Poor historian.  He tells me that his left eye is also gradually shutting off. ?Seen by Dr. Sharol Given, scheduled for MRI of the heel, will also order MRI angiogram of the brain.  Afebrile. ? ?Objective: ?Vitals:  ? 03/25/22 0411 03/25/22 [id number] 03/25/22 [id number] 03/25/22 [id number]  ?BP: 100/64 100/64  113/65  ?Pulse: 80 80  83  ?Resp: 16 16    ?Temp:  98.2 ?F (36.8 ?C)  98.8 ?F (37.1 ?C)  ?TempSrc:  Oral  Oral  ?SpO2: 100% 100% 99%   ?Weight:      ?Height:      ? ? ?Intake/Output Summary (Last 24 hours) at 03/25/2022 1104 ?Last data filed at 03/24/2022 1500 ?Gross per 24 hour  ?Intake 1240 ml  ?Output 450 ml  ?Net 790 ml  ? ?Filed Weights  ? 03/20/22 2117 03/21/22 0025  ?Weight: 90.2 kg 93.2 kg  ? ? ?  Examination: ? ?General exam: Appears calm and comfortable  ?Frail and debilitated.  Chronically sick looking.  Not in any distress.  On room air. ?Respiratory system: Clear to auscultation. Respiratory effort normal.  No added sounds. ?Cardiovascular system: S1 & S2 heard, RRR.  ?Gastrointestinal system:  Soft.  Nontender.  Bowel sound present. ?Central nervous system: Alert and oriented.  Flat affect. ?Patient does not have any vision on the right eye. ?Left eye with blurry vision, visual field cut on superior medial visual field. ?Both eyes are pinpoint and sluggishly reactive to light. ?Extremities: Generalized weakness.  Right upper and lower extremity more weaker than left. ?Right heel with necrotic looking ulcer with some drainage present.  Surrounding skin looks healthy. ? ? ? ?Data Reviewed: I have personally reviewed following labs and imaging studies ? ?CBC: ?Recent Labs  ?Lab 03/20/22 ?2116 03/21/22 ?[id number] 03/22/22 ?[id number] 03/23/22 ?[id number] 03/24/22 ?[id number]  ?WBC 14.9* 12.6* 12.1* 9.9 9.3  ?NEUTROABS 11.1* 8.9* 8.8* 6.9 5.7  ?HGB 9.7* 9.3* 9.7* 9.4* 10.4*  ?HCT 30.3* 29.4* 31.2* 30.1* 33.2*  ?MCV 96.8 97.4 97.5 97.1 97.6  ?PLT 353 308 316 303 299  ? ?Basic Metabolic Panel: ?Recent Labs  ?Lab 03/20/22 ?2116 03/21/22 ?[id number] 03/22/22 ?[id number] 03/23/22 ?[id number] 03/24/22 ?[id number]  ?NA 137 138 138 138 140  ?K 4.0 3.9 4.2 4.0 4.1  ?CL 102 104 105 107 107  ?CO2 26 26 25 25 25   ?GLUCOSE 208* 155* 205* 215* 189*  ?BUN 29* 30* 29* 28* 27*  ?CREATININE 1.34* 1.03 1.37* 1.39* 1.36*  ?CALCIUM 9.3 9.0 9.1 9.2 9.4  ?MG 2.1 2.2  --   --   --   ?PHOS 4.3 4.7*  --   --   --   ? ?GFR: ?Estimated Creatinine Clearance: 60.2 mL/min (A) (by C-G formula based on SCr of 1.36 mg/dL (H)). ?Liver Function Tests: ?Recent Labs  ?Lab 03/20/22 ?2116 03/21/22 ?[id number]  ?AST 13* 10*  ?ALT 16 13  ?ALKPHOS 58 50  ?BILITOT 0.4 0.4  ?PROT 7.7 7.2  ?ALBUMIN 3.0* 2.7*  ? ?No results for input(s): LIPASE, AMYLASE in the last 168 hours. ?No results for input(s): AMMONIA in the last 168 hours. ?Coagulation Profile: ?No results for input(s): INR, PROTIME in the last 168 hours. ?Cardiac Enzymes: ?No results for input(s): CKTOTAL, CKMB, CKMBINDEX, TROPONINI in the last 168 hours. ?BNP (last 3 results) ?No results for input(s): PROBNP in the last 8760 hours. ?HbA1C: ?No  results for input(s): HGBA1C in the last 72 hours. ?CBG: ?Recent Labs  ?Lab 03/24/22 ?0701 03/24/22 ?1118 03/24/22 ?1637 03/24/22 ?1958 03/25/22 ?0915  ?GLUCAP 217* 223* 129* 110* 149*  ? ?Lipid Profile: ?No results for input(s): CHOL, HDL, LDLCALC, TRIG, CHOLHDL, LDLDIRECT in the last 72 hours. ?Thyroid Function Tests: ?No results for input(s): TSH, T4TOTAL, FREET4, T3FREE, THYROIDAB in the last 72 hours. ?Anemia Panel: ?No results for input(s): VITAMINB12, FOLATE, FERRITIN, TIBC, IRON, RETICCTPCT in the last 72 hours. ?Sepsis Labs: ?No results for input(s): PROCALCITON, LATICACIDVEN in the last 168 hours. ? ?Recent Results (from the past 240 hour(s))  ?Culture, blood (Routine X 2) w Reflex to ID Panel     Status: None (Preliminary result)  ? Collection Time: 03/21/22  8:10 PM  ? Specimen: BLOOD  ?Result Value Ref Range Status  ? Specimen Description   Final  ?  BLOOD BLOOD LEFT ARM ?Performed at [facility], [place] [address] [place], [place] [address] ?  ? Special Requests   Final  ?  BOTTLES  DRAWN AEROBIC ONLY Blood Culture adequate volume ?Performed at [facility], [place] [address], [place], [place] [address] ?  ? Culture   Final  ?  NO GROWTH 4 DAYS ?Performed at [facility], [place] [address], [place], [place] [address] ?  ? Report Status PENDING  Incomplete  ?Culture, blood (Routine X 2) w Reflex to ID Panel     Status: None (Preliminary result)  ? Collection Time: 03/21/22  8:15 PM  ? Specimen: BLOOD  ?Result Value Ref Range Status  ? Specimen Description   Final  ?  BLOOD BLOOD LEFT HAND ?Performed at [facility], [place] [address] [place], [place] [address] ?  ? Special Requests   Final  ?  IN PEDIATRIC BOTTLE Blood Culture adequate volume ?Performed at [facility], [place] [address] [place], [place] [address] ?  ? Culture   Final  ?  NO GROWTH 4 DAYS ?Performed at [facility], [place] [address] [place], [place] [address] ?   ? Report Status PENDING  Incomplete  ?  ? ? ? ? ? ?Radiology Studies: ?No results found. ? ? ? ? ? ?Scheduled Meds: ? allopurinol  100 mg Oral Daily  ? aspirin EC  81 mg Oral Daily  ? atorvastatin  80 mg Ora

## 2022-03-25 NOTE — Consult Note (Signed)
? ? ?ORTHOPAEDIC CONSULTATION ? ?REQUESTING PHYSICIAN: Barb Merino, MD ? ?Chief Complaint: Chronic right heel ulcer. ? ?HPI: ?Johnathan Keith is a 61 y.o. male who presents with chronic right heel necrotic ulcer with reported abscess drainage. ? ?Patient has had the painful necrotic ulcer for over a year.  Patient has been nonambulatory for a year. ? ?Patient has Parkinson's disease has had multiple strokes.   ? ?Past Medical History:  ?Diagnosis Date  ? Abdominal distention   ? Abnormal CT scan 12/09/2018  ? Abnormal nuclear cardiac imaging test   ? High-risk nuclear stress test   ? Acute febrile illness 05/08/2019  ? Acute pancreatitis 05/08/2019  ? Adjustment disorder with mixed anxiety and depressed mood 07/27/2020  ? AKI (acute kidney injury) ([place]) 03/26/2020  ? Arthritis   ? Arthropathy of cervical facet joint 01/31/2019  ? Bicuspid aortic valve 03/27/2020  ? Formatting of this note might be different from the original. 2020: ECHO  ? Bilateral low back pain without sciatica 09/13/2015  ? Bilateral lower extremity edema 08/18/2018  ? Bilateral lower extremity edema  Formatting of this note might be different from the original. Bilateral lower extremity edema  Last Assessment & Plan:  He does have 1-2+ pitting edema.  I am going to increase his Lasix to 80 mg a day, and will check a basic metabolic panel in 7 to 10 days.  He will see Cyril Mourning back in 1 month for follow-up of lab work.  ? 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 Bradycardia 04/07/2020  ? CAD -S/P PCI-2016 11/30/2015  ? LAD DES 2016. Admitted to [facility] Sept 2017 with SOB, not felt to be in CHF, Myoview was negative for sichemia  Formatting of this note might be different from the original. Overview:  LAD DES 2016. Admitted to [facility] Sept 2017 with SOB, not felt to be in CHF, Myoview was negative for sichemia  Last Assessment & Plan:  History of CAD status post LAD stenting by myself in 2016 with re-intervention because o  ? Carotid artery  stenosis- 09/26/2014  ? Rt CEA Oct 2015-Dr Chen  ? Carotid stenosis 04/07/2013  ? Rt CEA Oct 2015-Dr Chen  Last Assessment & Plan:  History of carotid artery disease status post right carotid endarterectomy by Dr. Bridgett Larsson October 2015 which he follows by duplex ultrasound. Formatting of this note might be different from the original. Rt CEA Oct 2015-Dr Bridgett Larsson  Last Assessment & Plan:  History of carotid artery disease status post right carotid endarterectomy by Dr. Bridgett Larsson October 2015  ? Carpal tunnel syndrome 07/17/2015  ? 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[place]) 09/13/2015  ? Cerebrovascular accident (CVA) due to thrombosis of right middle cerebral artery ([place]) 07/04/2014  ? Questionable small stroke in 2014 with transient Lt sided weakness in setting of carotid disease.  Formatting of this note might be different from the original. Overview:  Questionable small stroke in 2014 with transient Lt sided weakness in setting of carotid disease.  Last Assessment & Plan:  History of multiple strokes in the past dating back to 2014 with a recent stroke in April of this year a  ? Cervical spine disease 04/07/2013  ? Cervical spondylosis without myelopathy 01/31/2019  ? Cholecystitis 06/24/2019  ? Cholelithiases 12/09/2018  ? Chronic diarrhea 12/09/2018  ? Chronic hypoxemic respiratory failure ([place]) 06/19/2020  ? Chronic neck and back pain   ? Chronic pain disorder 01/31/2019  ? Clostridium difficile diarrhea   ?  negative culture 08/17/19  ? Community acquired pneumonia 06/19/2020  ? Formatting of this note might be different from the original. 2021: hosp  ? Coronary artery disease   ? drug-eluting stents placed in proximal and mid LAD  ? Coronary artery disease with history of myocardial infarction without history of CABG 04/07/2013  ? DDD (degenerative disc disease), cervical 01/31/2019  ? 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 Deviated septum   ? Diabetes mellitus type 2, uncontrolled 04/07/2013  ?  Diabetic polyneuropathy associated with type 2 diabetes mellitus ([place]) 01/31/2019  ? Diabetic retinopathy ([place])   ? Dyslipidemia 12/09/2018  ? Dyspnea on exertion 03/26/2020  ? Dyspnea on exertion  Formatting of this note might be different from the original. Overview:  Dyspnea on exertion  Last Assessment & Plan:  Improved after stenting of his LAD  ? Elevated troponin 12/09/2018  ? Enteritis 11/30/2020  ? Formatting of this note might be different from the original. 11/30/2020:  ? Erectile dysfunction 11/25/2015  ? Essential hypertension 04/07/2013  ? hypertension  Formatting of this note might be different from the original. hypertension  Last Assessment & Plan:  History of essential hypertension her blood pressure measured at 162/70.  He is on benazepril 10 the morning and 20 in the evening as well as hydralazine, hydrochlorothiazide, and metoprolol.  Blood pressures have been on the high side.  I am going to increase his benazepril to 20 mg   ? GERD (gastroesophageal reflux disease)   ? "occasionally" (03/23/2015)  ? Headache   ? "more than 2/wk" (03/23/2015)  ? Heart attack ([place])   ? "in 1997 was told I had signs of a small heart attack that I didn't know I'd had"  ? High cholesterol   ? History of cardioembolic cerebrovascular accident (CVA) 12/09/2018  ? History of gout   ? "when I was younger"  ? History of kidney stones   ? passed  ? History of stroke 04/20/2018  ? Hyperlipidemia, unspecified 04/07/2013  ? hyperlipidemia  Formatting of this note might be different from the original. IMO routine update IMO routine update hyperlipidemia  Last Assessment & Plan:  History of hyperlipidemia on pravastatin with lipid profile performed by his PCP 03/17/2018 revealing total cholesterol 126, LDL 55 HDL 24. Formatting of this note might be different from the original. hyperlipidemia  Last Assessment & Plan:  Hi  ? Hypertension   ? Hypogonadism male 01/11/2016  ? Kidney disease   ? Large liver 12/09/2018  ? Lower back pain 06/14/2015  ?  Lumbar facet joint pain 06/14/2015  ? Lumbar foraminal stenosis 01/31/2019  ? Lymphocytosis 05/24/2015  ? Memory loss 03/15/2020  ? Migraine   ? "had my 1st and only in 12/2014" (03/23/2015)  ? Morbid obesity ([place]) 11/08/2020  ? Neural foraminal stenosis of cervical spine 01/31/2019  ? Neutrophilia 05/24/2015  ? Non morbid obesity due to excess calories 02/12/2016  ? Occipital neuralgia of right side 12/18/2016  ? Occlusion and stenosis of unspecified carotid artery 04/07/2013  ? Formatting of this note might be different from the original. Overview:  Rt CEA Oct 2015-Dr Bridgett Larsson  Last Assessment & Plan:  History of carotid artery disease status post right carotid endarterectomy by Dr. Bridgett Larsson October 2015 which he follows by duplex ultrasound.  ? Peripheral neuropathy   ? feet  ? Peripheral vascular disease ([place])   ? carotid artery disease  ? Pneumonia 11/19,1/20  ? PONV (postoperative nausea and vomiting) 2015  ? "was  told I was confused and combative in recovery from the narcotics w/my carotid OR"  "During a colonoscopy my oxygen level dropped so low that I had to be woke up.- 03/2017 colonoscopy [facility]   ? Sepsis [facility]) 05/08/2019  ? Serum total bilirubin elevated 05/08/2019  ? Sleep apnea   ? "severe; couldn't afford mask" (03/23/2015) uses O2 at night.no C-pap  ? SOB (shortness of breath) 05/04/2019  ? 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 Stroke [facility]) 2014  ? "on walker for 2 months; left side is weaker and numb since" (03/23/2015), "2 confirmed strokes"  ? Stroke ([place]) 05/10/2019  ? Aphasia mild  ? Stroke, lacunar ([place]) 04/07/2013  ? Transaminitis 05/08/2019  ? Type 2 diabetes mellitus with diabetic neuropathy ([place]) 12/09/2018  ? Type 2 diabetes mellitus with diabetic neuropathy, with long-term current use of insulin ([place]) 07/04/2014  ? Type 2 diabetes mellitus, with long-term current use of insulin ([place]) 02/12/2016  ? Type II diabetes mellitus ([place])   ? ?Past Surgical History:  ?Procedure Laterality Date  ? BILIARY DILATION   08/11/2019  ? Procedure: BILIARY DILATION;  Surgeon: Rush Landmark Telford Nab., MD;  Location: [place];  Service: Gastroenterology;;  ? BIOPSY  08/11/2019  ? Procedure: BIOPSY;  Surgeon: Justice Britain

## 2022-03-25 NOTE — Care Management Important Message (Signed)
Important Message ? ?Patient Details  ?Name: Johnathan Keith ?MRN: [id number] ?Date of Birth: Aug 18, 1961 ? ? ?Medicare Important Message Given:  Yes ? ? ? ? ?Lucynda Rosano ?03/25/2022, 3:33 PM ?

## 2022-03-26 DIAGNOSIS — L089 Local infection of the skin and subcutaneous tissue, unspecified: Secondary | ICD-10-CM | POA: Diagnosis not present

## 2022-03-26 LAB — GLUCOSE, CAPILLARY
Glucose-Capillary: 91 mg/dL (ref 70–99)
Glucose-Capillary: 131 mg/dL — ABNORMAL HIGH (ref 70–99)
Glucose-Capillary: 137 mg/dL — ABNORMAL HIGH (ref 70–99)
Glucose-Capillary: 171 mg/dL — ABNORMAL HIGH (ref 70–99)

## 2022-03-26 LAB — CULTURE, BLOOD (ROUTINE X 2)
Culture: NO GROWTH
Culture: NO GROWTH
Special Requests: ADEQUATE
Special Requests: ADEQUATE

## 2022-03-26 NOTE — Progress Notes (Signed)
Patient ID: Johnathan Keith, male   DOB: 07/10/1961, 60 y.o.   MRN: [id number] ?Patient is seen in follow-up status post MRI scan right hindfoot.  Patient's wife relays a story of prolonged ulceration with most recent episode of sepsis secondary to the chronic infection.  The MRI scan does show chronic osteomyelitis of the calcaneus.  With the size of the ulcer and the amount of calcaneus involved I do not feel that calcaneal excision is an option.  I have recommended proceeding with a transtibial amputation.  Patient's wife states she would like to proceed as soon as possible due to his recent sepsis.  We will plan for surgery tomorrow Wednesday. ?

## 2022-03-26 NOTE — Progress Notes (Signed)
Physical Therapy Treatment ?Patient Details ?Name: Johnathan Keith ?MRN: [id number] ?DOB: [birth year] ?Today's Date: 03/26/2022 ? ? ?History of Present Illness Patient is 61 y.o. male admitted on 03/20/2022 with  R calcaneus infection. PMH significant for coronary artery disease status post PCI with stenting on DAPT, HTN, CVA, HFrEF 40 to 45%, asthma, COPD, GERD, gout, DMII, diabetic polyneuropathy, wheelchair-bound for the past 1 year. His wife at bedside states she has been doing the wound change dressing and noted foul-smelling and dark tissue.  She reports prior evaluation by vascular surgery, no lack of blood flow in the right foot.  Upon presentation to the ED at [facility], the patient is afebrile and vital signs are stable.  CT right lower extremity revealed possible abscess involving his right calcaneus, measuring approximately 4 x 2 x 1 cm.  No CT evidence of osteomyelitis. ? ?  ?PT Comments  ? ? Pt was seen for sit balance control and LE exercise to both increase stability of use of wheelchair as well as ability to provide care to pt.  Pt is demonstrating improved sit balance, improved active movement of LE's and improved control of bed mob.  Follow along with plan to increase safety and comfort of movement and will plan follow up with the [facility] services as were provided prior to hospitalization.   ?Recommendations for follow up therapy are one component of a multi-disciplinary discharge planning process, led by the attending physician.  Recommendations may be updated based on patient status, additional functional criteria and insurance authorization. ? ?Follow Up Recommendations ? Other (comment) (return to pace services at home) ?  ?  ?Assistance Recommended at Discharge Frequent or constant Supervision/Assistance  ?Patient can return home with the following A lot of help with walking and/or transfers;A lot of help with bathing/dressing/bathroom;Assistance with cooking/housework;Assist for  transportation;Help with stairs or ramp for entrance;Direct supervision/assist for medications management ?  ?Equipment Recommendations ? None recommended by PT  ?  ?Recommendations for Other Services   ? ? ?  ?Precautions / Restrictions Precautions ?Precautions: Fall ?Precaution Comments: monitor wound on R foot ?Restrictions ?Weight Bearing Restrictions: No  ?  ? ?Mobility ? Bed Mobility ?Overal bed mobility: Needs Assistance ?Bed Mobility: Sidelying to Sit, Sit to Sidelying ?Rolling: Mod assist ?Sidelying to sit: Mod assist ?Supine to sit: Max assist ?  ?  ?  ?  ? ?Transfers ?  ?  ?  ?  ?  ?  ?  ?  ?  ?General transfer comment: declined, unable ?  ? ?Ambulation/Gait ?  ?  ?  ?  ?  ?  ?  ?  ? ? ?Stairs ?  ?  ?  ?  ?  ? ? ?Wheelchair Mobility ?  ? ?Modified Rankin (Stroke Patients Only) ?  ? ? ?  ?Balance Overall balance assessment: Needs assistance ?Sitting-balance support: Feet supported, Bilateral upper extremity supported ?Sitting balance-Leahy Scale: Poor ?  ?  ?  ?  ?  ?  ?  ?  ?  ?  ?  ?  ?  ?  ?  ?  ?  ? ?  ?Cognition Arousal/Alertness: Awake/alert ?Behavior During Therapy: [facility] for tasks assessed/performed ?Overall Cognitive Status: Within Functional Limits for tasks assessed ?  ?  ?  ?  ?  ?  ?  ?  ?  ?  ?  ?  ?  ?  ?  ?  ?  ?  ?  ? ?  ?Exercises General Exercises -  Lower Extremity ?Ankle Circles/Pumps: AROM, 5 reps ?Long Arc Quad: AAROM, 10 reps ?Heel Slides: AAROM, 10 reps ?Hip ABduction/ADduction: AAROM, 10 reps ?Straight Leg Raises: AAROM, 10 reps ? ?  ?General Comments General comments (skin integrity, edema, etc.): Pt was assisted to sit on side of bed with assistance, practiced use of UE's, tilt of bed and worked on midline control ?  ?  ? ?Pertinent Vitals/Pain Pain Assessment ?Pain Assessment: No/denies pain  ? ? ?Home Living   ?  ?  ?  ?  ?  ?  ?  ?  ?  ?   ?  ?Prior Function    ?  ?  ?   ? ?PT Goals (current goals can now be found in the care plan section) Acute Rehab PT Goals ?Patient Stated  Goal: get foot better ?Progress towards PT goals: Progressing toward goals ? ?  ?Frequency ? ? ? Min 2X/week ? ? ? ?  ?PT Plan Current plan remains appropriate  ? ? ?Co-evaluation   ?  ?  ?  ?  ? ?  ?AM-PAC PT "6 Clicks" Mobility   ?Outcome Measure ? Help needed turning from your back to your side while in a flat bed without using bedrails?: A Lot ?Help needed moving from lying on your back to sitting on the side of a flat bed without using bedrails?: A Lot ?Help needed moving to and from a bed to a chair (including a wheelchair)?: A Lot ?Help needed standing up from a chair using your arms (e.g., wheelchair or bedside chair)?: Total ?Help needed to walk in hospital room?: Total ?Help needed climbing 3-5 steps with a railing? : Total ?6 Click Score: 9 ? ?  ?End of Session   ?Activity Tolerance: Patient tolerated treatment well ?Patient left: in bed;with call bell/phone within reach;with bed alarm set;with family/visitor present ?Nurse Communication: Mobility status ?PT Visit Diagnosis: Muscle weakness (generalized) (M62.81);Unsteadiness on feet (R26.81);Other abnormalities of gait and mobility (R26.89);Other symptoms and signs involving the nervous system (R29.898) ?  ? ? ?Time: 0258-5277 ?PT Time Calculation (min) (ACUTE ONLY): 27 min ? ?Charges:  $Therapeutic Exercise: 8-22 mins ?$Therapeutic Activity: 8-22 mins   ?Ivar Drape ?03/26/2022, 8:01 PM ? ?Samul Dada, PT PhD ?Acute Rehab Dept. Number: [facility] [phone] and [facility] [phone] ? ? ?

## 2022-03-26 NOTE — Progress Notes (Signed)
?PROGRESS NOTE ? ? ? ?Johnathan Keith  AQT:[id number] DOB: 11/03/1961 DOA: 03/20/2022 ?PCP: Olive Bass, MD  ? ? ?Brief Narrative:  ?61 year old gentleman with severe, multiple underlying comorbidities with history of coronary artery disease status post PCI now on dual antiplatelet therapy, history of stroke with generalized weakness right more than left, wheelchair-bound, hypertension, COPD, GERD, type 2 diabetes on insulin and diabetic polyneuropathy who was suffering from right heel nonhealing wound for about a year, seen at [facility] and found to have localized abscess and sent to [facility].   he was admitted to [facility] long for last 4 days, recommended transfer to [facility] for surgical intervention. ?Also complains of gradual vision loss in the left eye.  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 ? ? ?Assessment & Plan: ?  ?Right heel abscess and cellulitis, nonhealing wound: MRI consistent with calcaneal osteomyelitis. ?Continue vancomycin cefepime and Flagyl.  Apparently recent vascular studies were normal.  ?Seen by surgery.  Is scheduled for transtibial amputation tomorrow.  ? ?Type 2 diabetes with diabetic nephropathy, long-term insulin use with hyperglycemia: Hemoglobin A1c 9.5.  Currently on Lantus and prandial insulin.  Blood sugars acceptable today. ? ?History of stroke, generalized debility, right hemiparesis, right sided blindness: ?No neurological deficit but patient complained of gradual loss of vision on the left eye also. ?Seen by ophthalmology on 4/6 in the office, notes were reviewed.  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 Continue Cosopt eyedrops. ?Ophthalmologist had recommended echocardiogram and angiogram for retinal artery stenosis, ?MR angiogram of the head with no occlusive lesion, thrombus.  Echocardiogram with normal intracardiac thrombus.  ?This is likely progressive visual loss, unsure any reversibility.  Discussed this with patient  and wife at the bedside. ? ?Essential hypertension: Blood pressure stable on Proscar.  Continue. ? ?coronary artery disease with history of MI: On aspirin, Plavix, Lipitor and metoprolol.  Orthopedics intervention planned. ? ?Abnormal kidney functions: Baseline not available.  Creatinine has remained at about the same range since admission.  We will continue to monitor on treatment. ? ? ?DVT prophylaxis: enoxaparin (LOVENOX) injection 40 mg Start: 03/21/22 2200 ? ? ?Code Status: Full code ?Family Communication: Wife at the bedside.  Daughter on the phone. ?Disposition Plan: Status is: Inpatient ?Remains inpatient appropriate because: Heel ulcer, needs inpatient surgery ?  ? ? ?Consultants:  ?Dr. Lajoyce Corners ? ?Procedures:  ?None ? ?Antimicrobials:  ?Vancomycin, cefepime and Flagyl 4/12--- ? ? ?Subjective: ? ?Patient seen and examined.  Wife reported some confusion and impulsiveness however not as bad as previous hospitalizations.  Afebrile. ?Patient and whole family is upset about having to undergo amputation, however they understand benefits of surgery versus suffering with nonhealing wounds. ?Spent time with patient, wife at the bedside and daughter on the phone and explained about challenging medical situations and they all understand and accept. ? ?Objective: ?Vitals:  ? 03/25/22 2258 03/26/22 0425 03/26/22 5625 03/26/22 0845  ?BP: 116/74 (!) 89/60    ?Pulse:  70 93   ?Resp: 12 18    ?Temp: 98.4 ?F (36.9 ?C) 98.2 ?F (36.8 ?C) 97.8 ?F (36.6 ?C)   ?TempSrc: Oral  Oral   ?SpO2: 95% 90% 94% 94%  ?Weight:      ?Height:      ? ? ?Intake/Output Summary (Last 24 hours) at 03/26/2022 1145 ?Last data filed at 03/26/2022 0957 ?Gross per 24 hour  ?Intake 400 ml  ?Output --  ?Net 400 ml  ? ?Filed Weights  ? 03/20/22  2117 03/21/22 0025  ?Weight: 90.2 kg 93.2 kg  ? ? ?Examination: ? ?General exam: Appears calm and comfortable . Frail and debilitated.  Chronically sick looking.  Not in any distress.  On room air.  Patient is very upset  and tearful. ?Respiratory system: Clear to auscultation. Respiratory effort normal.  No added sounds. ?Cardiovascular system: S1 & S2 heard, RRR.  ?Gastrointestinal system: Soft.  Nontender.  Bowel sound present. ?Central nervous system: Alert and oriented.  Flat affect. ?Patient does not have any vision on the right eye. ?Left eye with blurry vision, visual field cut on superior medial visual field. ?Extremities: Generalized weakness.  Right upper and lower extremity more weaker than left. ?Right heel with necrotic looking ulcer with some drainage present.  Surrounding skin looks healthy. ? ? ? ?Data Reviewed: I have personally reviewed following labs and imaging studies ? ?CBC: ?Recent Labs  ?Lab 03/20/22 ?2116 03/21/22 ?8638 03/22/22 ?1771 03/23/22 ?1657 03/24/22 ?9038  ?WBC 14.9* 12.6* 12.1* 9.9 9.3  ?NEUTROABS 11.1* 8.9* 8.8* 6.9 5.7  ?HGB 9.7* 9.3* 9.7* 9.4* 10.4*  ?HCT 30.3* 29.4* 31.2* 30.1* 33.2*  ?MCV 96.8 97.4 97.5 97.1 97.6  ?PLT 353 308 316 303 299  ? ?Basic Metabolic Panel: ?Recent Labs  ?Lab 03/20/22 ?2116 03/21/22 ?3338 03/22/22 ?3291 03/23/22 ?9166 03/24/22 ?0600  ?NA 137 138 138 138 140  ?K 4.0 3.9 4.2 4.0 4.1  ?CL 102 104 105 107 107  ?CO2 26 26 25 25 25   ?GLUCOSE 208* 155* 205* 215* 189*  ?BUN 29* 30* 29* 28* 27*  ?CREATININE 1.34* 1.03 1.37* 1.39* 1.36*  ?CALCIUM 9.3 9.0 9.1 9.2 9.4  ?MG 2.1 2.2  --   --   --   ?PHOS 4.3 4.7*  --   --   --   ? ?GFR: ?Estimated Creatinine Clearance: 60.2 mL/min (A) (by C-G formula based on SCr of 1.36 mg/dL (H)). ?Liver Function Tests: ?Recent Labs  ?Lab 03/20/22 ?2116 03/21/22 ?03/23/22  ?AST 13* 10*  ?ALT 16 13  ?ALKPHOS 58 50  ?BILITOT 0.4 0.4  ?PROT 7.7 7.2  ?ALBUMIN 3.0* 2.7*  ? ?No results for input(s): LIPASE, AMYLASE in the last 168 hours. ?No results for input(s): AMMONIA in the last 168 hours. ?Coagulation Profile: ?No results for input(s): INR, PROTIME in the last 168 hours. ?Cardiac Enzymes: ?No results for input(s): CKTOTAL, CKMB, CKMBINDEX, TROPONINI in  the last 168 hours. ?BNP (last 3 results) ?No results for input(s): PROBNP in the last 8760 hours. ?HbA1C: ?No results for input(s): HGBA1C in the last 72 hours. ?CBG: ?Recent Labs  ?Lab 03/25/22 ?1211 03/25/22 ?1636 03/25/22 ?1920 03/26/22 ?0807 03/26/22 ?1129  ?GLUCAP 143* 137* 166* 91 131*  ? ?Lipid Profile: ?No results for input(s): CHOL, HDL, LDLCALC, TRIG, CHOLHDL, LDLDIRECT in the last 72 hours. ?Thyroid Function Tests: ?No results for input(s): TSH, T4TOTAL, FREET4, T3FREE, THYROIDAB in the last 72 hours. ?Anemia Panel: ?No results for input(s): VITAMINB12, FOLATE, FERRITIN, TIBC, IRON, RETICCTPCT in the last 72 hours. ?Sepsis Labs: ?No results for input(s): PROCALCITON, LATICACIDVEN in the last 168 hours. ? ?Recent Results (from the past 240 hour(s))  ?Culture, blood (Routine X 2) w Reflex to ID Panel     Status: None  ? Collection Time: 03/21/22  8:10 PM  ? Specimen: BLOOD  ?Result Value Ref Range Status  ? Specimen Description   Final  ?  BLOOD BLOOD LEFT ARM ?Performed at [facility], [address] [place], [place] ?  ? Special  Requests   Final  ?  BOTTLES DRAWN AEROBIC ONLY Blood Culture adequate volume ?Performed at [facility], [address] [place], [place] [address] ?  ? Culture   Final  ?  NO GROWTH 5 DAYS ?Performed at [facility], [address], [place], [place] [address] ?  ? Report Status 03/26/2022 FINAL  Final  ?Culture, blood (Routine X 2) w Reflex to ID Panel     Status: None  ? Collection Time: 03/21/22  8:15 PM  ? Specimen: BLOOD  ?Result Value Ref Range Status  ? Specimen Description   Final  ?  BLOOD BLOOD LEFT HAND ?Performed at [facility], [address] [place], [place] [address] ?  ? Special Requests   Final  ?  IN PEDIATRIC BOTTLE Blood Culture adequate volume ?Performed at [facility], [address] [place], [place] [address] ?  ? Culture   Final  ?  NO GROWTH 5  DAYS ?Performed at [facility], [address] [place], [place] [address] ?  ? Report Status 03/26/2022 FINAL  Final  ?  ? ? ? ? ? ?Radiology Studies: ?MR ANGIO HEAD WO CONTRAST ? ?Result Date: 03/25/2022 ?C

## 2022-03-26 NOTE — H&P (View-Only) (Signed)
Patient ID: Johnathan Keith, male   DOB: 1961-02-09, 61 y.o.   MRN: [id number] ?Patient is seen in follow-up status post MRI scan right hindfoot.  Patient's wife relays a story of prolonged ulceration with most recent episode of sepsis secondary to the chronic infection.  The MRI scan does show chronic osteomyelitis of the calcaneus.  With the size of the ulcer and the amount of calcaneus involved I do not feel that calcaneal excision is an option.  I have recommended proceeding with a transtibial amputation.  Patient's wife states she would like to proceed as soon as possible due to his recent sepsis.  We will plan for surgery tomorrow Wednesday. ?

## 2022-03-27 ENCOUNTER — Inpatient Hospital Stay (HOSPITAL_COMMUNITY): Payer: No Typology Code available for payment source | Admitting: Anesthesiology

## 2022-03-27 ENCOUNTER — Other Ambulatory Visit: Payer: Self-pay

## 2022-03-27 ENCOUNTER — Encounter (HOSPITAL_COMMUNITY): Payer: Self-pay | Admitting: Internal Medicine

## 2022-03-27 ENCOUNTER — Encounter (HOSPITAL_COMMUNITY)
Admission: EM | Disposition: A | Discharge status: Home / Self Care | Payer: Self-pay | Source: Other Acute Inpatient Hospital | Attending: Internal Medicine

## 2022-03-27 DIAGNOSIS — E1169 Type 2 diabetes mellitus with other specified complication: Secondary | ICD-10-CM

## 2022-03-27 DIAGNOSIS — I6990 Unspecified sequelae of unspecified cerebrovascular disease: Secondary | ICD-10-CM

## 2022-03-27 DIAGNOSIS — Z794 Long term (current) use of insulin: Secondary | ICD-10-CM

## 2022-03-27 DIAGNOSIS — L089 Local infection of the skin and subcutaneous tissue, unspecified: Secondary | ICD-10-CM | POA: Diagnosis not present

## 2022-03-27 DIAGNOSIS — M869 Osteomyelitis, unspecified: Secondary | ICD-10-CM

## 2022-03-27 HISTORY — PX: AMPUTATION: SHX166

## 2022-03-27 HISTORY — PX: APPLICATION OF WOUND VAC: SHX5189

## 2022-03-27 LAB — MRSA NEXT GEN BY PCR, NASAL: MRSA by PCR Next Gen: NOT DETECTED

## 2022-03-27 LAB — BASIC METABOLIC PANEL
Anion gap: 9 (ref 5–15)
BUN: 35 mg/dL — ABNORMAL HIGH (ref 8–23)
CO2: 20 mmol/L — ABNORMAL LOW (ref 22–32)
Calcium: 9.6 mg/dL (ref 8.9–10.3)
Chloride: 109 mmol/L (ref 98–111)
Creatinine, Ser: 1.68 mg/dL — ABNORMAL HIGH (ref 0.61–1.24)
GFR, Estimated: 46 mL/min — ABNORMAL LOW (ref >60)
Glucose, Bld: 146 mg/dL — ABNORMAL HIGH (ref 70–99)
Potassium: 4.1 mmol/L (ref 3.5–5.1)
Sodium: 138 mmol/L (ref 135–145)

## 2022-03-27 LAB — GLUCOSE, CAPILLARY
Glucose-Capillary: 117 mg/dL — ABNORMAL HIGH (ref 70–99)
Glucose-Capillary: 136 mg/dL — ABNORMAL HIGH (ref 70–99)
Glucose-Capillary: 127 mg/dL — ABNORMAL HIGH (ref 70–99)
Glucose-Capillary: 89 mg/dL (ref 70–99)
Glucose-Capillary: 83 mg/dL (ref 70–99)
Glucose-Capillary: 83 mg/dL (ref 70–99)

## 2022-03-27 LAB — VANCOMYCIN, TROUGH: Vancomycin Tr: 53 ug/mL (ref 15–20)

## 2022-03-27 SURGERY — AMPUTATION BELOW KNEE
Anesthesia: General | Site: Leg Upper | Laterality: Right

## 2022-03-27 MED ORDER — FENTANYL CITRATE (PF) 100 MCG/2ML IJ SOLN
INTRAMUSCULAR | Status: AC
  Administered 2022-03-27: 25 ug via INTRAVENOUS
  Filled 2022-03-27: qty 2

## 2022-03-27 MED ORDER — BUPIVACAINE HCL (PF) 0.25 % IJ SOLN
INTRAMUSCULAR | Status: DC | PRN
  Administered 2022-03-27: 20 mL via PERINEURAL

## 2022-03-27 MED ORDER — CHLORHEXIDINE GLUCONATE 4 % EX LIQD
60.0000 mL | Freq: Once | CUTANEOUS | Status: DC
  Administered 2022-03-27: 4 via TOPICAL

## 2022-03-27 MED ORDER — MAGNESIUM CITRATE PO SOLN: 1.0000 | Freq: Once | ORAL | Status: DC | PRN

## 2022-03-27 MED ORDER — SODIUM CHLORIDE 0.9 % IV SOLN: INTRAVENOUS | Status: DC

## 2022-03-27 MED ORDER — ACETAMINOPHEN 325 MG PO TABS: 325.0000 mg | ORAL_TABLET | Freq: Four times a day (QID) | ORAL | Status: DC | PRN

## 2022-03-27 MED ORDER — ONDANSETRON HCL 4 MG/2ML IJ SOLN: 4.0000 mg | Freq: Once | INTRAMUSCULAR | Status: DC | PRN

## 2022-03-27 MED ORDER — PROPOFOL 10 MG/ML IV BOLUS
INTRAVENOUS | Status: DC | PRN
  Administered 2022-03-27: 120 mg via INTRAVENOUS

## 2022-03-27 MED ORDER — FENTANYL CITRATE (PF) 100 MCG/2ML IJ SOLN: 25.0000 ug | INTRAMUSCULAR | Status: DC | PRN

## 2022-03-27 MED ORDER — MIDAZOLAM HCL 2 MG/2ML IJ SOLN
INTRAMUSCULAR | Status: AC
  Filled 2022-03-27: qty 2

## 2022-03-27 MED ORDER — CEFAZOLIN SODIUM-DEXTROSE 2-4 GM/100ML-% IV SOLN
2.0000 g | Freq: Three times a day (TID) | INTRAVENOUS | Status: AC
  Administered 2022-03-27 – 2022-03-28 (×2): 2 g via INTRAVENOUS
  Filled 2022-03-27 (×2): qty 100

## 2022-03-27 MED ORDER — PROPOFOL 10 MG/ML IV BOLUS
INTRAVENOUS | Status: AC
  Filled 2022-03-27: qty 20

## 2022-03-27 MED ORDER — HYDROCODONE-ACETAMINOPHEN 7.5-325 MG PO TABS
1.0000 | ORAL_TABLET | ORAL | Status: DC | PRN
  Administered 2022-03-29 – 2022-03-30 (×2): 2 via ORAL
  Filled 2022-03-27 (×3): qty 2
  Filled 2022-03-27: qty 1

## 2022-03-27 MED ORDER — HYDROCODONE-ACETAMINOPHEN 5-325 MG PO TABS
1.0000 | ORAL_TABLET | ORAL | Status: DC | PRN
  Administered 2022-03-31: 2 via ORAL
  Filled 2022-03-27: qty 2

## 2022-03-27 MED ORDER — MORPHINE SULFATE (PF) 2 MG/ML IV SOLN: 0.5000 mg | INTRAVENOUS | Status: DC | PRN

## 2022-03-27 MED ORDER — FENTANYL CITRATE (PF) 250 MCG/5ML IJ SOLN
INTRAMUSCULAR | Status: AC
  Filled 2022-03-27: qty 5

## 2022-03-27 MED ORDER — POLYETHYLENE GLYCOL 3350 17 G PO PACK: 17.0000 g | PACK | Freq: Every day | ORAL | Status: DC | PRN

## 2022-03-27 MED ORDER — MAGNESIUM SULFATE 2 GM/50ML IV SOLN: 2.0000 g | Freq: Every day | INTRAVENOUS | Status: DC | PRN

## 2022-03-27 MED ORDER — CEFAZOLIN SODIUM-DEXTROSE 2-3 GM-%(50ML) IV SOLR
INTRAVENOUS | Status: DC | PRN
  Administered 2022-03-27: 2 g via INTRAVENOUS

## 2022-03-27 MED ORDER — ZINC SULFATE 220 (50 ZN) MG PO CAPS
220.0000 mg | ORAL_CAPSULE | Freq: Every day | ORAL | Status: DC
  Administered 2022-03-27 – 2022-04-02 (×6): 220 mg via ORAL
  Filled 2022-03-27 (×7): qty 1

## 2022-03-27 MED ORDER — BISACODYL 5 MG PO TBEC
5.0000 mg | DELAYED_RELEASE_TABLET | Freq: Every day | ORAL | Status: DC | PRN
  Administered 2022-04-02: 5 mg via ORAL
  Filled 2022-03-27: qty 1

## 2022-03-27 MED ORDER — OXYCODONE HCL 5 MG/5ML PO SOLN: 5.0000 mg | Freq: Once | ORAL | Status: DC | PRN

## 2022-03-27 MED ORDER — LABETALOL HCL 5 MG/ML IV SOLN: 10.0000 mg | INTRAVENOUS | Status: DC | PRN

## 2022-03-27 MED ORDER — OXYCODONE HCL 5 MG PO TABS: 5.0000 mg | ORAL_TABLET | Freq: Once | ORAL | Status: DC | PRN

## 2022-03-27 MED ORDER — ONDANSETRON HCL 4 MG/2ML IJ SOLN
INTRAMUSCULAR | Status: DC | PRN
Start: 2022-03-27 — End: 2022-03-27
  Administered 2022-03-27: 4 mg via INTRAVENOUS

## 2022-03-27 MED ORDER — SODIUM CHLORIDE 0.9 % IV SOLN
2.0000 g | Freq: Two times a day (BID) | INTRAVENOUS | Status: DC
  Administered 2022-03-27 – 2022-03-28 (×2): 2 g via INTRAVENOUS
  Filled 2022-03-27 (×2): qty 12.5

## 2022-03-27 MED ORDER — CEFAZOLIN SODIUM-DEXTROSE 2-4 GM/100ML-% IV SOLN: 2.0000 g | INTRAVENOUS | Status: DC

## 2022-03-27 MED ORDER — ROPIVACAINE HCL 5 MG/ML IJ SOLN
INTRAMUSCULAR | Status: DC | PRN
  Administered 2022-03-27: 20 mL via PERINEURAL

## 2022-03-27 MED ORDER — POVIDONE-IODINE 10 % EX SWAB
2.0000 "application " | Freq: Once | CUTANEOUS | Status: AC
  Administered 2022-03-27: 2 via TOPICAL

## 2022-03-27 MED ORDER — ASCORBIC ACID 500 MG PO TABS
1000.0000 mg | ORAL_TABLET | Freq: Every day | ORAL | Status: DC
  Administered 2022-03-27 – 2022-04-02 (×6): 1000 mg via ORAL
  Filled 2022-03-27 (×7): qty 2

## 2022-03-27 MED ORDER — LIDOCAINE 2% (20 MG/ML) 5 ML SYRINGE
INTRAMUSCULAR | Status: DC | PRN
  Administered 2022-03-27: 30 mg via INTRAVENOUS

## 2022-03-27 MED ORDER — HYDRALAZINE HCL 20 MG/ML IJ SOLN: 5.0000 mg | INTRAMUSCULAR | Status: DC | PRN

## 2022-03-27 MED ORDER — PANTOPRAZOLE SODIUM 40 MG PO TBEC: 40.0000 mg | DELAYED_RELEASE_TABLET | Freq: Every day | ORAL | Status: DC

## 2022-03-27 MED ORDER — 0.9 % SODIUM CHLORIDE (POUR BTL) OPTIME
TOPICAL | Status: DC | PRN
  Administered 2022-03-27: 1000 mL

## 2022-03-27 MED ORDER — SODIUM CHLORIDE 0.9 % IV SOLN
INTRAVENOUS | Status: DC | PRN
Start: 2022-03-27 — End: 2022-03-27

## 2022-03-27 MED ORDER — JUVEN PO PACK
1.0000 | PACK | Freq: Two times a day (BID) | ORAL | Status: DC
  Administered 2022-03-29 – 2022-04-02 (×8): 1 via ORAL
  Filled 2022-03-27 (×10): qty 1

## 2022-03-27 MED ORDER — ONDANSETRON HCL 4 MG/2ML IJ SOLN
INTRAMUSCULAR | Status: AC
  Filled 2022-03-27: qty 2

## 2022-03-27 MED ORDER — ONDANSETRON HCL 4 MG/2ML IJ SOLN: 4.0000 mg | Freq: Four times a day (QID) | INTRAMUSCULAR | Status: DC | PRN

## 2022-03-27 MED ORDER — VANCOMYCIN VARIABLE DOSE PER UNSTABLE RENAL FUNCTION (PHARMACIST DOSING): Status: DC

## 2022-03-27 MED ORDER — GUAIFENESIN-DM 100-10 MG/5ML PO SYRP: 15.0000 mL | ORAL_SOLUTION | ORAL | Status: DC | PRN

## 2022-03-27 MED ORDER — PHENOL 1.4 % MT LIQD: 1.0000 | OROMUCOSAL | Status: DC | PRN

## 2022-03-27 MED ORDER — ALUM & MAG HYDROXIDE-SIMETH 200-200-20 MG/5ML PO SUSP: 15.0000 mL | ORAL | Status: DC | PRN

## 2022-03-27 MED ORDER — DOCUSATE SODIUM 100 MG PO CAPS
100.0000 mg | ORAL_CAPSULE | Freq: Every day | ORAL | Status: DC
  Administered 2022-03-30: 100 mg via ORAL
  Filled 2022-03-27 (×3): qty 1

## 2022-03-27 MED ORDER — FENTANYL CITRATE (PF) 100 MCG/2ML IJ SOLN: 25.0000 ug | Freq: Once | INTRAMUSCULAR | Status: AC

## 2022-03-27 MED ORDER — METOPROLOL TARTRATE 5 MG/5ML IV SOLN: 2.0000 mg | INTRAVENOUS | Status: DC | PRN

## 2022-03-27 MED ORDER — POTASSIUM CHLORIDE CRYS ER 20 MEQ PO TBCR: 20.0000 meq | EXTENDED_RELEASE_TABLET | Freq: Every day | ORAL | Status: DC | PRN

## 2022-03-27 MED ORDER — BUPIVACAINE LIPOSOME 1.3 % IJ SUSP
INTRAMUSCULAR | Status: DC | PRN
  Administered 2022-03-27: 10 mL via PERINEURAL

## 2022-03-27 MED ORDER — LACTATED RINGERS IV SOLN: INTRAVENOUS | Status: DC | PRN

## 2022-03-27 MED ORDER — CEFAZOLIN SODIUM-DEXTROSE 2-4 GM/100ML-% IV SOLN
INTRAVENOUS | Status: AC
  Filled 2022-03-27: qty 100

## 2022-03-27 SURGICAL SUPPLY — 39 items
BAG COUNTER SPONGE SURGICOUNT (BAG) IMPLANT
BLADE SAW RECIP 87.9 MT (BLADE) ×3 IMPLANT
BLADE SURG 21 STRL SS (BLADE) ×3 IMPLANT
BNDG COHESIVE 6X5 TAN STRL LF (GAUZE/BANDAGES/DRESSINGS) IMPLANT
CANISTER WOUND CARE 500ML ATS (WOUND CARE) ×3 IMPLANT
COVER SURGICAL LIGHT HANDLE (MISCELLANEOUS) ×3 IMPLANT
CUFF TOURN SGL QUICK 34 (TOURNIQUET CUFF) ×1
CUFF TRNQT CYL 34X4.125X (TOURNIQUET CUFF) ×2 IMPLANT
DRAPE DERMATAC (DRAPES) ×2 IMPLANT
DRAPE INCISE IOBAN 66X45 STRL (DRAPES) ×3 IMPLANT
DRAPE U-SHAPE 47X51 STRL (DRAPES) ×3 IMPLANT
DRESSING PREVENA PLUS CUSTOM (GAUZE/BANDAGES/DRESSINGS) ×2 IMPLANT
DRSG PREVENA PLUS CUSTOM (GAUZE/BANDAGES/DRESSINGS) ×3
DURAPREP 26ML APPLICATOR (WOUND CARE) ×3 IMPLANT
ELECT REM PT RETURN 9FT ADLT (ELECTROSURGICAL) ×3
ELECTRODE REM PT RTRN 9FT ADLT (ELECTROSURGICAL) ×2 IMPLANT
GLOVE BIOGEL PI IND STRL 9 (GLOVE) ×2 IMPLANT
GLOVE BIOGEL PI INDICATOR 9 (GLOVE) ×1
GLOVE SURG ORTHO 9.0 STRL STRW (GLOVE) ×3 IMPLANT
GOWN STRL REUS W/ TWL XL LVL3 (GOWN DISPOSABLE) ×4 IMPLANT
GOWN STRL REUS W/TWL XL LVL3 (GOWN DISPOSABLE) ×2
KIT BASIN OR (CUSTOM PROCEDURE TRAY) ×3 IMPLANT
KIT TURNOVER KIT B (KITS) ×3 IMPLANT
MANIFOLD NEPTUNE II (INSTRUMENTS) ×3 IMPLANT
NS IRRIG 1000ML POUR BTL (IV SOLUTION) ×3 IMPLANT
PACK ORTHO EXTREMITY (CUSTOM PROCEDURE TRAY) ×3 IMPLANT
PAD ARMBOARD 7.5X6 YLW CONV (MISCELLANEOUS) ×3 IMPLANT
PREVENA RESTOR ARTHOFORM 46X30 (CANNISTER) ×3 IMPLANT
PREVENA RESTOR AXIOFORM 29X28 (GAUZE/BANDAGES/DRESSINGS) ×1 IMPLANT
SPONGE T-LAP 18X18 ~~LOC~~+RFID (SPONGE) IMPLANT
STAPLER VISISTAT 35W (STAPLE) ×1 IMPLANT
STOCKINETTE IMPERVIOUS LG (DRAPES) ×3 IMPLANT
SUT ETHILON 2 0 PSLX (SUTURE) ×3 IMPLANT
SUT SILK 2 0 (SUTURE) ×1
SUT SILK 2-0 18XBRD TIE 12 (SUTURE) ×2 IMPLANT
SUT VIC AB 1 CTX 27 (SUTURE) ×6 IMPLANT
TOWEL GREEN STERILE (TOWEL DISPOSABLE) ×3 IMPLANT
TUBE CONNECTING 12X1/4 (SUCTIONS) ×3 IMPLANT
YANKAUER SUCT BULB TIP NO VENT (SUCTIONS) ×3 IMPLANT

## 2022-03-27 NOTE — Transfer of Care (Signed)
Immediate Anesthesia Transfer of Care Note ? ?Patient: Johnathan Keith ? ?Procedure(s) Performed: RIGHT BELOW KNEE AMPUTATION (Right: Knee) ?APPLICATION OF WOUND VAC (Right: Leg Upper) ? ?Patient Location: PACU ? ?Anesthesia Type:General ? ?Level of Consciousness: drowsy ? ?Airway & Oxygen Therapy: Patient Spontanous Breathing ? ?Post-op Assessment: Report given to RN and Post -op Vital signs reviewed and stable ? ?Post vital signs: Reviewed and stable ? ?Last Vitals:  ?Vitals Value Taken Time  ?BP 94/51 03/27/22 1935  ?Temp    ?Pulse 96 03/27/22 1936  ?Resp 18 03/27/22 1936  ?SpO2 95 % 03/27/22 1936  ?Vitals shown include unvalidated device data. ? ?Last Pain:  ?Vitals:  ? 03/27/22 1719  ?TempSrc:   ?PainSc: 0-No pain  ?   ? ?Patients Stated Pain Goal: 2 (03/24/22 0004) ? ?Complications: No notable events documented. ?

## 2022-03-27 NOTE — Progress Notes (Signed)
Critical lab report vancomycin trough 53 Doc notified ?

## 2022-03-27 NOTE — Anesthesia Procedure Notes (Signed)
Anesthesia Regional Block: Popliteal block  ? ?Pre-Anesthetic Checklist: , timeout performed,  Correct Patient, Correct Site, Correct Laterality,  Correct Procedure, Correct Position, site marked,  Risks and benefits discussed,  Surgical consent,  Pre-op evaluation,  At surgeon's request and post-op pain management ? ?Laterality: Right ? ?Prep: chloraprep     ?  ?Needles:  ?Injection technique: Single-shot ? ?Needle Type: Echogenic Needle   ? ? ?Needle Length: 9cm  ?Needle Gauge: 21  ? ? ? ?Additional Needles: ? ? ?Procedures:,,,, ultrasound used (permanent image in chart),,    ?Narrative:  ?Start time: 03/27/2022 6:17 PM ?End time: 03/27/2022 6:23 PM ?Injection made incrementally with aspirations every 5 mL. ? ?Performed by: Personally  ?Anesthesiologist: Marcene Duos, MD ? ? ? ? ?

## 2022-03-27 NOTE — Anesthesia Procedure Notes (Signed)
Anesthesia Regional Block: Adductor canal block  ? ?Pre-Anesthetic Checklist: , timeout performed,  Correct Patient, Correct Site, Correct Laterality,  Correct Procedure, Correct Position, site marked,  Risks and benefits discussed,  Surgical consent,  Pre-op evaluation,  At surgeon's request and post-op pain management ? ?Laterality: Right ? ?Prep: chloraprep     ?  ?Needles:  ?Injection technique: Single-shot ? ?Needle Type: Echogenic Needle   ? ? ?Needle Length: 9cm  ?Needle Gauge: 21  ? ? ? ?Additional Needles: ? ? ?Procedures:,,,, ultrasound used (permanent image in chart),,    ?Narrative:  ?Start time: 03/27/2022 6:23 PM ?End time: 03/27/2022 6:27 PM ?Injection made incrementally with aspirations every 5 mL. ? ?Performed by: Personally  ?Anesthesiologist: Suzette Battiest, MD ? ? ? ? ?

## 2022-03-27 NOTE — Anesthesia Preprocedure Evaluation (Addendum)
Anesthesia Evaluation  ?Patient identified by MRN, date of birth, ID band ?Patient awake ? ? ? ?Reviewed: ?Allergy & Precautions, NPO status , Patient's Chart, lab work & pertinent test results ? ?History of Anesthesia Complications ?(+) PONV and history of anesthetic complications ? ?Airway ?Mallampati: II ? ?TM Distance: >3 FB ?Neck ROM: Full ? ? ? Dental ? ?(+) Dental Advisory Given, Poor Dentition, Missing, Loose ?  ?Pulmonary ?sleep apnea and Oxygen sleep apnea ,  ?  ?Pulmonary exam normal ? ? ? ? ? ? ? Cardiovascular ?hypertension, Pt. on home beta blockers and Pt. on medications ?+ CAD, + Past MI, + Cardiac Stents and + Peripheral Vascular Disease  ?Normal cardiovascular exam ? ? ?  ?Neuro/Psych ? Headaches,  Neuromuscular disease CVA, Residual Symptoms negative psych ROS  ? GI/Hepatic ?Neg liver ROS, GERD  Controlled,  ?Endo/Other  ?diabetes, Type 2, Insulin Dependent ?Obesity ? ? Renal/GU ?negative Renal ROS  ? ?  ?Musculoskeletal ? ?(+) Arthritis ,  ? Abdominal ?  ?Peds ? Hematology ?negative hematology ROS ?(+)   ?Anesthesia Other Findings ? ? Reproductive/Obstetrics ? ?  ? ? ? ? ? ? ? ? ? ? ? ? ? ?  ?  ? ? ? ? ? ? ? ?Anesthesia Physical ?Anesthesia Plan ? ?ASA: 4 ? ?Anesthesia Plan: General  ? ?Post-op Pain Management: Regional block*  ? ?Induction: Intravenous ? ?PONV Risk Score and Plan: 3 and Treatment may vary due to age or medical condition, Ondansetron, Dexamethasone and Midazolam ? ?Airway Management Planned: LMA ? ?Additional Equipment: None ? ?Intra-op Plan:  ? ?Post-operative Plan: Extubation in OR ? ?Informed Consent: I have reviewed the patients History and Physical, chart, labs and discussed the procedure including the risks, benefits and alternatives for the proposed anesthesia with the patient or authorized representative who has indicated his/her understanding and acceptance.  ? ? ? ?Dental advisory given ? ?Plan Discussed with: CRNA and  Anesthesiologist ? ?Anesthesia Plan Comments:   ? ? ? ? ? ?Anesthesia Quick Evaluation ? ?

## 2022-03-27 NOTE — TOC Progression Note (Signed)
Transition of Care (TOC) - Progression Note  ? ? ?Patient Details  ?Name: Johnathan Keith ?MRN: [id number] ?Date of Birth: 1961/07/29 ? ?Transition of Care (TOC) CM/SW Contact  ?Lorri Frederick, LCSW ?Phone Number: ?03/27/2022, 10:56 AM ? ?Clinical Narrative:   CSW received call from [place] with [facility] [phone]) regarding DC planning.   ? ? ? ?  ?  ? ?Expected Discharge Plan and Services ?  ?  ?  ?  ?  ?                ?  ?  ?  ?  ?  ?  ?  ?  ?  ?  ? ? ?Social Determinants of Health (SDOH) Interventions ?  ? ?Readmission Risk Interventions ?   ? View : No data to display.  ?  ?  ?  ? ? ?

## 2022-03-27 NOTE — Progress Notes (Signed)
?PROGRESS NOTE ? ? ? ?Johnathan Keith  NLG:[id number] DOB: [birth year] DOA: 03/20/2022 ?PCP: Olive Bass, MD  ? ? ?Brief Narrative:  ?61 year old gentleman with severe, multiple underlying comorbidities with history of coronary artery disease status post PCI now on dual antiplatelet therapy, history of stroke with generalized weakness right more than left, wheelchair-bound, hypertension, COPD, GERD, type 2 diabetes on insulin and diabetic polyneuropathy who was suffering from right heel nonhealing wound for about a year, seen at [facility] and found to have localized abscess and sent to [facility].   he was admitted to [facility] long for last 4 days, recommended transfer to [facility] for surgical intervention. ?Also complains of gradual vision loss in the left eye.  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 ? ? ?Assessment & Plan: ?  ?Right heel abscess and cellulitis, nonhealing wound: MRI consistent with calcaneal osteomyelitis. ?Continue vancomycin cefepime and Flagyl.  Apparently recent vascular studies were normal.  ?Seen by surgery.  Is scheduled for transtibial amputation today. ? ?Type 2 diabetes with diabetic nephropathy, long-term insulin use with hyperglycemia: Hemoglobin A1c 9.5.  Currently on Lantus and prandial insulin.  Blood sugars acceptable today.  Patient is NPO.  We will keep on current dose of insulin. ? ?History of stroke, generalized debility, right hemiparesis, right sided blindness: ?No neurological deficit but patient complained of gradual loss of vision on the left eye also. ?Seen by ophthalmology on 4/6 in the office, notes were reviewed.  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 Continue Cosopt eyedrops. ?Ophthalmologist had recommended echocardiogram and angiogram for retinal artery stenosis, ?MR angiogram of the head with no occlusive lesion, thrombus.  Echocardiogram with normal intracardiac thrombus.  ?This is likely progressive visual loss,  unsure any reversibility.  Discussed this with patient and wife at the bedside. ? ?Essential hypertension: Blood pressure stable on Proscar.  Continue. ? ?coronary artery disease with history of MI: On aspirin, Plavix, Lipitor and metoprolol.  Orthopedics intervention planned. ? ?Abnormal kidney functions: Baseline not available.  Creatinine stable with fluctuations.  Continue to monitor on multiple antibiotics.  Hopefully, will be able to discontinue antibiotics after surgery.   ? ?Delirium: Acute metabolic infective encephalopathy anticipated from ongoing issues.  Delirium precautions.  Try to avoid benzodiazepines and opiates.  Frequent reorientation. ? ? ?DVT prophylaxis: enoxaparin (LOVENOX) injection 40 mg Start: 03/21/22 2200 ? ? ?Code Status: Full code ?Family Communication: None today. ?Disposition Plan: Status is: Inpatient ?Remains inpatient appropriate because: Heel ulcer, needs inpatient surgery ?  ? ? ?Consultants:  ?Dr. Lajoyce Corners ? ?Procedures:  ?None ? ?Antimicrobials:  ?Vancomycin, cefepime and Flagyl 4/12--- ? ? ?Subjective: ? ?Patient seen and examined.  Today he was more tearful, occasionally confused.  When I examined him his wife had stepped out to get lunch and he was hoping her to come back quick.  He was constantly repeating same sentence.  Surgery was postponed for the afternoon and he was very anxious about it. ? ?Objective: ?Vitals:  ? 03/26/22 2013 03/27/22 0408 03/27/22 0837 03/27/22 1158  ?BP:  119/73  117/79  ?Pulse:  69  87  ?Resp:  20  16  ?Temp:  97.7 ?F (36.5 ?C)  (!) 97.5 ?F (36.4 ?C)  ?TempSrc:    Oral  ?SpO2: 99% 100% 100% 99%  ?Weight:      ?Height:      ? ?No intake or output data in the 24 hours ending 03/27/22 1358 ? ?Filed Weights  ? 03/20/22 2117  03/21/22 0025  ?Weight: 90.2 kg 93.2 kg  ? ? ?Examination: ? ?General exam: Frail.  Debilitated.  Sick looking.  On room air. Patient is very upset and tearful. ?Respiratory system: Clear to auscultation. Respiratory effort normal.   No added sounds. ?Cardiovascular system: S1 & S2 heard, RRR.  ?Gastrointestinal system: Soft.  Nontender.  Bowel sound present. ?Central nervous system: Alert and awake.  Slight impulsiveness.  Flat affect and anxious. ?Patient does not have any vision on the right eye. ?Left eye with blurry vision, visual field cut on superior medial visual field. ?Extremities: Generalized weakness.  Right upper and lower extremity more weaker than left. ?Right heel with necrotic looking ulcer with some drainage present.  Surrounding skin looks healthy. ? ? ? ?Data Reviewed: I have personally reviewed following labs and imaging studies ? ?CBC: ?Recent Labs  ?Lab 03/20/22 ?2116 03/21/22 ?0488 03/22/22 ?8916 03/23/22 ?9450 03/24/22 ?3888  ?WBC 14.9* 12.6* 12.1* 9.9 9.3  ?NEUTROABS 11.1* 8.9* 8.8* 6.9 5.7  ?HGB 9.7* 9.3* 9.7* 9.4* 10.4*  ?HCT 30.3* 29.4* 31.2* 30.1* 33.2*  ?MCV 96.8 97.4 97.5 97.1 97.6  ?PLT 353 308 316 303 299  ? ? ?Basic Metabolic Panel: ?Recent Labs  ?Lab 03/20/22 ?2116 03/21/22 ?2800 03/22/22 ?3491 03/23/22 ?7915 03/24/22 ?0569 03/27/22 ?7948  ?NA 137 138 138 138 140 138  ?K 4.0 3.9 4.2 4.0 4.1 4.1  ?CL 102 104 105 107 107 109  ?CO2 26 26 25 25 25  20*  ?GLUCOSE 208* 155* 205* 215* 189* 146*  ?BUN 29* 30* 29* 28* 27* 35*  ?CREATININE 1.34* 1.03 1.37* 1.39* 1.36* 1.68*  ?CALCIUM 9.3 9.0 9.1 9.2 9.4 9.6  ?MG 2.1 2.2  --   --   --   --   ?PHOS 4.3 4.7*  --   --   --   --   ? ? ?GFR: ?Estimated Creatinine Clearance: 48.8 mL/min (A) (by C-G formula based on SCr of 1.68 mg/dL (H)). ?Liver Function Tests: ?Recent Labs  ?Lab 03/20/22 ?2116 03/21/22 ?03/23/22  ?AST 13* 10*  ?ALT 16 13  ?ALKPHOS 58 50  ?BILITOT 0.4 0.4  ?PROT 7.7 7.2  ?ALBUMIN 3.0* 2.7*  ? ? ?No results for input(s): LIPASE, AMYLASE in the last 168 hours. ?No results for input(s): AMMONIA in the last 168 hours. ?Coagulation Profile: ?No results for input(s): INR, PROTIME in the last 168 hours. ?Cardiac Enzymes: ?No results for input(s): CKTOTAL, CKMB, CKMBINDEX,  TROPONINI in the last 168 hours. ?BNP (last 3 results) ?No results for input(s): PROBNP in the last 8760 hours. ?HbA1C: ?No results for input(s): HGBA1C in the last 72 hours. ?CBG: ?Recent Labs  ?Lab 03/26/22 ?1607 03/26/22 ?1947 03/27/22 ?03/29/22 03/27/22 ?03/29/22 03/27/22 ?1208  ?GLUCAP 137* 171* 117* 136* 127*  ? ? ?Lipid Profile: ?No results for input(s): CHOL, HDL, LDLCALC, TRIG, CHOLHDL, LDLDIRECT in the last 72 hours. ?Thyroid Function Tests: ?No results for input(s): TSH, T4TOTAL, FREET4, T3FREE, THYROIDAB in the last 72 hours. ?Anemia Panel: ?No results for input(s): VITAMINB12, FOLATE, FERRITIN, TIBC, IRON, RETICCTPCT in the last 72 hours. ?Sepsis Labs: ?No results for input(s): PROCALCITON, LATICACIDVEN in the last 168 hours. ? ?Recent Results (from the past 240 hour(s))  ?Culture, blood (Routine X 2) w Reflex to ID Panel     Status: None  ? Collection Time: 03/21/22  8:10 PM  ? Specimen: BLOOD  ?Result Value Ref Range Status  ? Specimen Description   Final  ?  BLOOD BLOOD LEFT ARM ?Performed at [facility], [address] [place], [place] [address] ?  ? Special Requests   Final  ?  BOTTLES DRAWN AEROBIC ONLY Blood Culture adequate volume ?Performed at [facility], [address] [place], [place] [address] ?  ? Culture   Final  ?  NO GROWTH 5 DAYS ?Performed at [facility], [address] [place], [place] [address] ?  ? Report Status 03/26/2022 FINAL  Final  ?Culture, blood (Routine X 2) w Reflex to ID Panel     Status: None  ? Collection Time: 03/21/22  8:15 PM  ? Specimen: BLOOD  ?Result Value Ref Range Status  ? Specimen Description   Final  ?  BLOOD BLOOD LEFT HAND ?Performed at [facility], [address], [place], [place] [address] ?  ? Special Requests   Final  ?  IN PEDIATRIC BOTTLE Blood Culture adequate volume ?Performed at [facility], [address], [place], [place] [address] ?  ? Culture   Final  ?  NO GROWTH  5 DAYS ?Performed at [facility], [address], [place], [place] [address] ?  ? Report Status 03/26/2022 FINAL  Final  ?MRSA Next Gen by PCR, Nasal     Status: None  ? Collection Time: 04/19/

## 2022-03-27 NOTE — Progress Notes (Signed)
Orthopedic Tech Progress Note ?Patient Details:  ?Khylon Davies ?March 27, 1961 ?[id number] ?Vive Protocol BK [facility]  ?Patient ID: Kenneith Stief, male   DOB: 01-07-1961, 61 y.o.   MRN: [id number] ? ?Makylee Sanborn E William Laske ?03/27/2022, 7:44 PM ? ?

## 2022-03-27 NOTE — Progress Notes (Signed)
Date and time results received: 03/27/22 1150 ?(use smartphrase ".now" to insert current time) ? ?Test: vancomycin trough ?Critical Value: 53 ? ?Name of Provider Notified: Dorcas Carrow MD ? ?Orders Received? Or Actions Taken?: no orders at time, Doc notified ?

## 2022-03-27 NOTE — Anesthesia Procedure Notes (Signed)
Procedure Name: LMA Insertion ?Date/Time: 03/27/2022 6:54 PM ?Performed by: Gwenyth Allegra, CRNA ?Pre-anesthesia Checklist: Patient identified, Emergency Drugs available, Suction available, Patient being monitored and Timeout performed ?Patient Re-evaluated:Patient Re-evaluated prior to induction ?Oxygen Delivery Method: Circle system utilized ?Preoxygenation: Pre-oxygenation with 100% oxygen ?Induction Type: Combination inhalational/ intravenous induction ?LMA: LMA inserted ?LMA Size: 4.0 ?Placement Confirmation: positive ETCO2 and breath sounds checked- equal and bilateral ?Tube secured with: Tape ? ? ? ? ?

## 2022-03-27 NOTE — Anesthesia Postprocedure Evaluation (Signed)
Anesthesia Post Note ? ?Patient: Johnathan Keith ? ?Procedure(s) Performed: RIGHT BELOW KNEE AMPUTATION (Right: Knee) ?APPLICATION OF WOUND VAC (Right: Leg Upper) ? ?  ? ?Patient location during evaluation: PACU ?Anesthesia Type: General ?Level of consciousness: awake and alert ?Pain management: pain level controlled ?Vital Signs Assessment: post-procedure vital signs reviewed and stable ?Respiratory status: spontaneous breathing, nonlabored ventilation, respiratory function stable and patient connected to nasal cannula oxygen ?Cardiovascular status: blood pressure returned to baseline and stable ?Postop Assessment: no apparent nausea or vomiting ?Anesthetic complications: no ? ? ?No notable events documented. ? ?Last Vitals:  ?Vitals:  ? 03/27/22 2010 03/27/22 2119  ?BP:  90/63  ?Pulse: (!) 103 (!) 102  ?Resp: 19 18  ?Temp: 36.7 ?C 36.6 ?C  ?SpO2: 94% 100%  ?  ?Last Pain:  ?Vitals:  ? 03/27/22 2000  ?TempSrc:   ?PainSc: 0-No pain  ? ? ?  ?  ?  ?  ?  ?  ? ?Marcene Duos E ? ? ? ? ?

## 2022-03-27 NOTE — Interval H&P Note (Signed)
History and Physical Interval Note: ? ?03/27/2022 ?7:00 AM ? ?Johnathan Keith  has presented today for surgery, with the diagnosis of Osteomyelitis Right Foot.  The various methods of treatment have been discussed with the patient and family. After consideration of risks, benefits and other options for treatment, the patient has consented to  Procedure(s): ?RIGHT BELOW KNEE AMPUTATION (Right) as a surgical intervention.  The patient's history has been reviewed, patient examined, no change in status, stable for surgery.  I have reviewed the patient's chart and labs.  Questions were answered to the patient's satisfaction.   ? ? ?Newt Minion ? ? ?

## 2022-03-27 NOTE — Progress Notes (Signed)
Pharmacy Antibiotic Note ? ?Johnathan Keith is a 61 y.o. male admitted on 03/20/2022 with a right foot infection/abscess - transferred from [facility]. Pharmacy consulted to dose broad spectrum antibiotics (vancomycin + cefepime). Purulent wound - vascular has been consulted.Transfered to [facility] for care and evaluation by Dr. Lajoyce Corners.  ? ?Today, 03/27/22 ?SCr up to 1.33>>1.68,patient now with AKI ?Vancomycin trough (24hr)=53 mcg/min ?Afebrile ? ?Plan: ?Continue Cefepime 2 g IV q8h + metronidazole 500 mg IV q12h ?Hold vancomycin and redose once level <20 ?Redraw level in 48 hours ?Follow renal function, check levels at steady state as needed ? ?Height: 5\' 6"  (167.6 cm) ?Weight: 93.2 kg (205 lb 7.5 oz) ?IBW/kg (Calculated) : 63.8 ? ?Temp (24hrs), Avg:97.8 ?F (36.6 ?C), Min:97.5 ?F (36.4 ?C), Max:98.2 ?F (36.8 ?C) ? ?Recent Labs  ?Lab 03/20/22 ?2116 03/21/22 ?03/23/22 03/22/22 ?03/24/22 03/23/22 ?03/25/22 03/24/22 ?03/26/22 03/27/22 ?03/29/22 03/27/22 ?1000  ?WBC 14.9* 12.6* 12.1* 9.9 9.3  --   --   ?CREATININE 1.34* 1.03 1.37* 1.39* 1.36* 1.68*  --   ?VANCOTROUGH  --   --   --   --   --   --  36*  ? ?  ?Estimated Creatinine Clearance: 48.8 mL/min (A) (by C-G formula based on SCr of 1.68 mg/dL (H)).   ? ?Allergies  ?Allergen Reactions  ? Metformin   ?  Other reaction(s): GI Upset (intolerance) ?Even with XR  ? ? ?Antimicrobials this admission: ?cefepime 4/12 >>  ?vancomycin 4/14 >>  ?Metronidazole 4/13 >> ? ?Dose adjustments this admission: ? ?Microbiology results: ?4/13 BCx: ngtd ? ?Rielyn Krupinski A. 5/13, PharmD, BCPS, FNKF ?Clinical Pharmacist ?[place] ?Please utilize Amion for appropriate phone number to reach the unit pharmacist [facility]) ? ?03/27/2022 12:59 PM ?

## 2022-03-27 NOTE — Op Note (Signed)
03/27/2022 ? ?7:29 PM ? ?PATIENT:  Johnathan Keith   ? ?PRE-OPERATIVE DIAGNOSIS:  Osteomyelitis Right Foot ? ?POST-OPERATIVE DIAGNOSIS:  Same ? ?PROCEDURE:  RIGHT BELOW KNEE AMPUTATION ? ?SURGEON:  Newt Minion, MD ? ?ANESTHESIA:   General ? ?PREOPERATIVE INDICATIONS:  Johnathan Keith is a  61 y.o. male with a diagnosis of Osteomyelitis Right Foot who failed conservative measures and elected for surgical management.   ? ?The risks benefits and alternatives were discussed with the patient preoperatively including but not limited to the risks of infection, bleeding, nerve injury, cardiopulmonary complications, the need for revision surgery, among others, and the patient was willing to proceed. ? ?OPERATIVE IMPLANTS: none ? ? ?OPERATIVE FINDINGS: Patient had muscle that had good color poor consistency good contractility. ? ?OPERATIVE PROCEDURE: Patient was brought to the operating room after undergoing a regional anesthetic.  After adequate levels anesthesia were obtained a thigh tourniquet was placed and the lower extremity was prepped using DuraPrep draped into a sterile field. The foot was draped out of the sterile field with impervious stockinette.  A timeout was called and the tourniquet inflated.  A transverse skin incision was made 12 cm distal to the tibial tubercle, the incision curved proximally, and a large posterior flap was created.  The tibia was transected just proximal to the skin incision and beveled anteriorly.  The fibula was transected just proximal to the tibial incision.  The sciatic nerve was pulled cut and allowed to retract.  The vascular bundles were suture ligated with 2-0 silk.  The tourniquet was deflated and hemostasis obtained.  The deep and superficial fascial layers were closed using #1 Vicryl.  The skin was closed using staples.  The Prevena customizable dressing was applied this was overwrapped with the arthroform sponge.  Charlie Pitter was used to secure the sponges and the circumferential  compression was secured to the skin with Dermatac.  This was connected to the wound VAC pump and had a good suction fit this was covered with a stump shrinker and a limb protector.  Patient was taken to the PACU in stable condition. ? ? ?DISCHARGE PLANNING: ? ?Antibiotic duration: 24-hour antibiotics ? ?Weightbearing: Nonweightbearing on the operative extremity ? ?Pain medication: Opioid pathway ? ?Dressing care/ Wound VAC: Continue wound VAC with the Prevena plus pump at discharge for 1 week ? ?Ambulatory devices: Walker or kneeling scooter ? ?Discharge to: Discharge planning based on recommendations per physical therapy ? ?Follow-up: In the office 1 week after discharge. ? ? ? ? ? ? ? ? ?

## 2022-03-28 ENCOUNTER — Encounter (HOSPITAL_COMMUNITY): Payer: Self-pay | Admitting: Orthopedic Surgery

## 2022-03-28 ENCOUNTER — Inpatient Hospital Stay (HOSPITAL_COMMUNITY): Payer: No Typology Code available for payment source

## 2022-03-28 DIAGNOSIS — L089 Local infection of the skin and subcutaneous tissue, unspecified: Secondary | ICD-10-CM | POA: Diagnosis not present

## 2022-03-28 LAB — CBC WITH DIFFERENTIAL/PLATELET
Abs Immature Granulocytes: 0.13 10*3/uL — ABNORMAL HIGH (ref 0.00–0.07)
Abs Immature Granulocytes: 0.1 10*3/uL — ABNORMAL HIGH (ref 0.00–0.07)
Basophils Absolute: 0.1 10*3/uL (ref 0.0–0.1)
Basophils Absolute: 0.1 10*3/uL (ref 0.0–0.1)
Basophils Relative: 0 %
Basophils Relative: 0 %
Eosinophils Absolute: 0.1 10*3/uL (ref 0.0–0.5)
Eosinophils Absolute: 0.1 10*3/uL (ref 0.0–0.5)
Eosinophils Relative: 1 %
Eosinophils Relative: 0 %
HCT: 28.1 % — ABNORMAL LOW (ref 39.0–52.0)
HCT: 27.2 % — ABNORMAL LOW (ref 39.0–52.0)
Hemoglobin: 8.8 g/dL — ABNORMAL LOW (ref 13.0–17.0)
Hemoglobin: 8.6 g/dL — ABNORMAL LOW (ref 13.0–17.0)
Immature Granulocytes: 1 %
Immature Granulocytes: 1 %
Lymphocytes Relative: 12 %
Lymphocytes Relative: 11 %
Lymphs Abs: 2 10*3/uL (ref 0.7–4.0)
Lymphs Abs: 1.8 10*3/uL (ref 0.7–4.0)
MCH: 30.7 pg (ref 26.0–34.0)
MCH: 31 pg (ref 26.0–34.0)
MCHC: 31.3 g/dL (ref 30.0–36.0)
MCHC: 31.6 g/dL (ref 30.0–36.0)
MCV: 97.9 fL (ref 80.0–100.0)
MCV: 98.2 fL (ref 80.0–100.0)
Monocytes Absolute: 1.5 10*3/uL — ABNORMAL HIGH (ref 0.1–1.0)
Monocytes Absolute: 1.6 10*3/uL — ABNORMAL HIGH (ref 0.1–1.0)
Monocytes Relative: 9 %
Monocytes Relative: 10 %
Neutro Abs: 13.5 10*3/uL — ABNORMAL HIGH (ref 1.7–7.7)
Neutro Abs: 13 10*3/uL — ABNORMAL HIGH (ref 1.7–7.7)
Neutrophils Relative %: 77 %
Neutrophils Relative %: 78 %
Platelets: 251 10*3/uL (ref 150–400)
Platelets: 224 10*3/uL (ref 150–400)
RBC: 2.87 MIL/uL — ABNORMAL LOW (ref 4.22–5.81)
RBC: 2.77 MIL/uL — ABNORMAL LOW (ref 4.22–5.81)
RDW: 15.3 % (ref 11.5–15.5)
RDW: 15.4 % (ref 11.5–15.5)
WBC: 17.3 10*3/uL — ABNORMAL HIGH (ref 4.0–10.5)
WBC: 16.6 10*3/uL — ABNORMAL HIGH (ref 4.0–10.5)
nRBC: 0 % (ref 0.0–0.2)
nRBC: 0 % (ref 0.0–0.2)

## 2022-03-28 LAB — AMMONIA: Ammonia: 26 umol/L (ref 9–35)

## 2022-03-28 LAB — COMPREHENSIVE METABOLIC PANEL
ALT: 9 U/L (ref 0–44)
AST: 18 U/L (ref 15–41)
Albumin: 2.6 g/dL — ABNORMAL LOW (ref 3.5–5.0)
Alkaline Phosphatase: 50 U/L (ref 38–126)
Anion gap: 8 (ref 5–15)
BUN: 39 mg/dL — ABNORMAL HIGH (ref 8–23)
CO2: 19 mmol/L — ABNORMAL LOW (ref 22–32)
Calcium: 9.1 mg/dL (ref 8.9–10.3)
Chloride: 111 mmol/L (ref 98–111)
Creatinine, Ser: 1.92 mg/dL — ABNORMAL HIGH (ref 0.61–1.24)
GFR, Estimated: 39 mL/min — ABNORMAL LOW (ref >60)
Glucose, Bld: 90 mg/dL (ref 70–99)
Potassium: 4.4 mmol/L (ref 3.5–5.1)
Sodium: 138 mmol/L (ref 135–145)
Total Bilirubin: 0.7 mg/dL (ref 0.3–1.2)
Total Protein: 7.1 g/dL (ref 6.5–8.1)

## 2022-03-28 LAB — GLUCOSE, CAPILLARY
Glucose-Capillary: 103 mg/dL — ABNORMAL HIGH (ref 70–99)
Glucose-Capillary: 78 mg/dL (ref 70–99)
Glucose-Capillary: 94 mg/dL (ref 70–99)
Glucose-Capillary: 103 mg/dL — ABNORMAL HIGH (ref 70–99)
Glucose-Capillary: 108 mg/dL — ABNORMAL HIGH (ref 70–99)

## 2022-03-28 LAB — BLOOD GAS, ARTERIAL
Acid-base deficit: 5.6 mmol/L — ABNORMAL HIGH (ref 0.0–2.0)
Bicarbonate: 18.2 mmol/L — ABNORMAL LOW (ref 20.0–28.0)
Drawn by: 35849
O2 Saturation: 100 %
Patient temperature: 37
pCO2 arterial: 30 mmHg — ABNORMAL LOW (ref 32–48)
pH, Arterial: 7.39 (ref 7.35–7.45)
pO2, Arterial: 103 mmHg (ref 83–108)

## 2022-03-28 IMAGING — CT CT HEAD W/O CM
4 series · 16 of 47 positions shown, 18 images · non-contrast
Comparison: [DATE]

CLINICAL DATA: Mental status change, unknown cause



[Series 3: head without · axial · non-contrast · 0.40mm/px · z∈[-661,-536]mm · 6 of 35 slices shown, 8 images]
[im 5/35  brain]
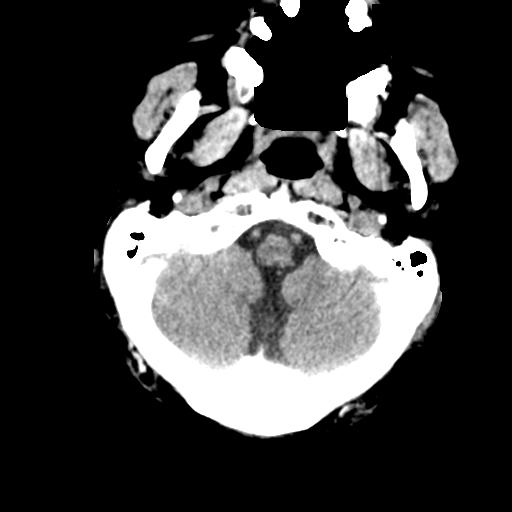
[im 5/35  bone]
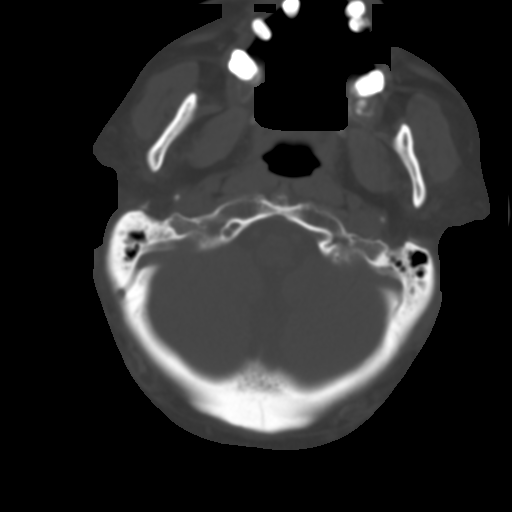
[im 10/35  brain]
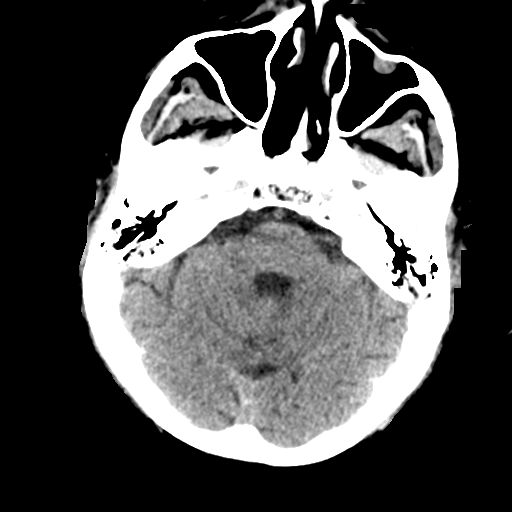
[im 15/35  brain]
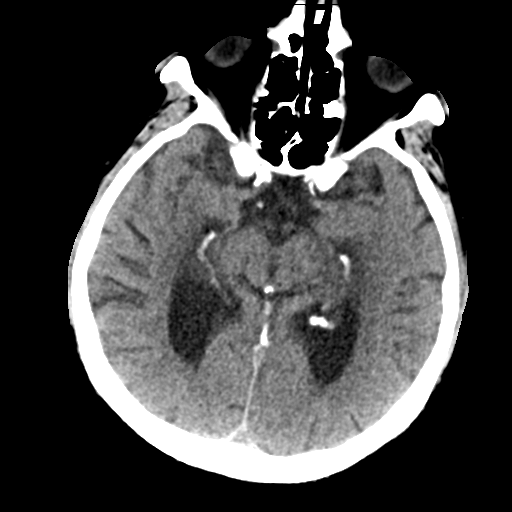
[im 20/35  brain]
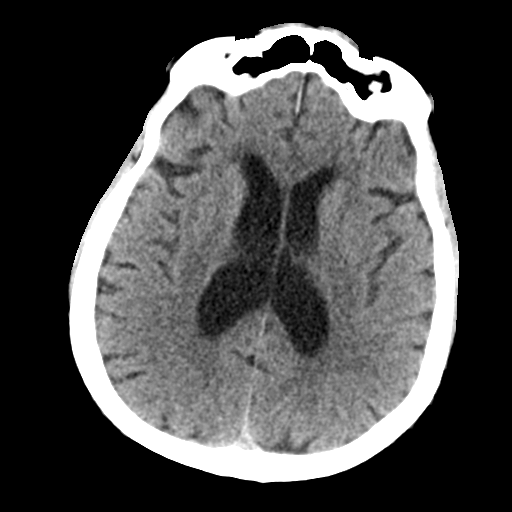
[im 25/35  brain]
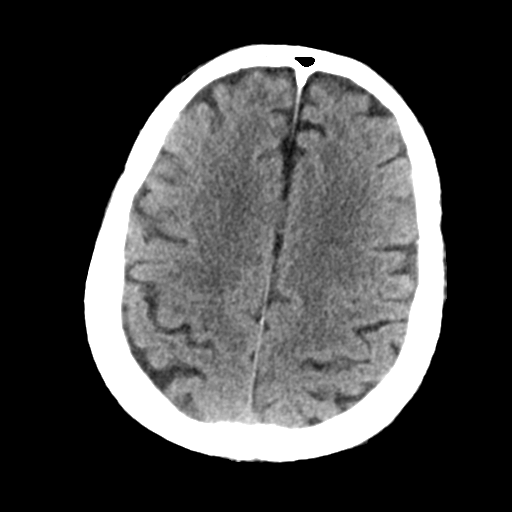
[im 25/35  bone]
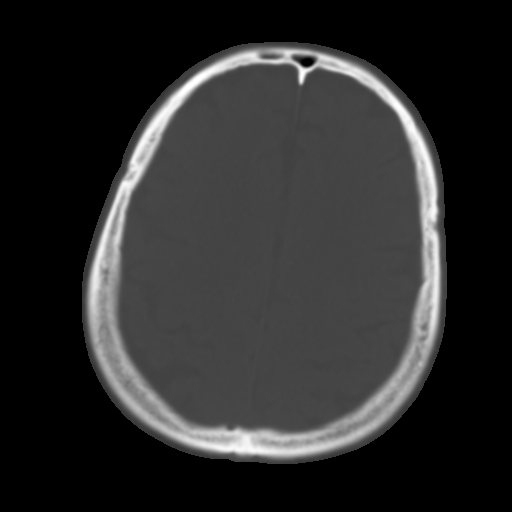
[im 30/35  brain]
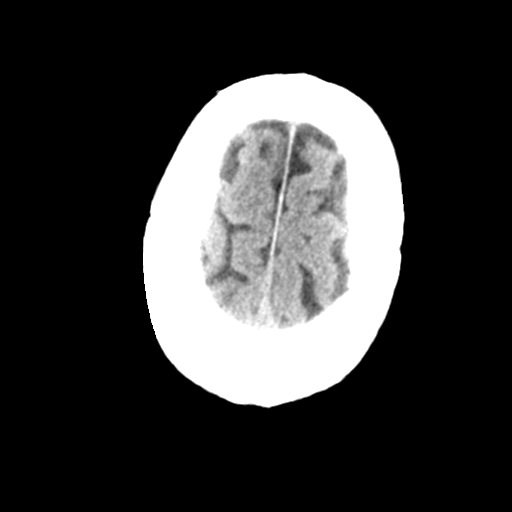

[Series 4: head bone · axial · 0.40mm/px · z∈[-665,-607]mm · 4 of 89 slices shown]
[im 9/89  bone]
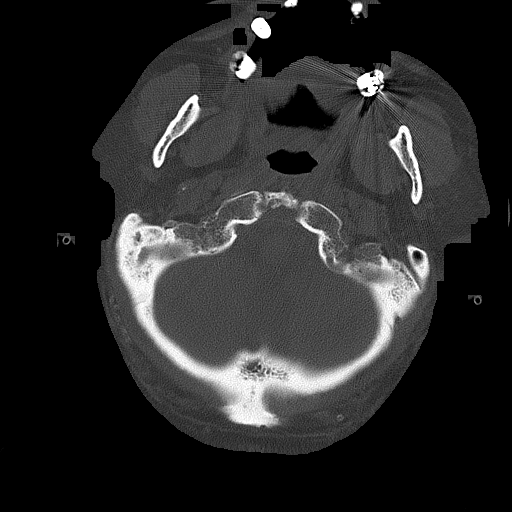
[im 17/89  bone]
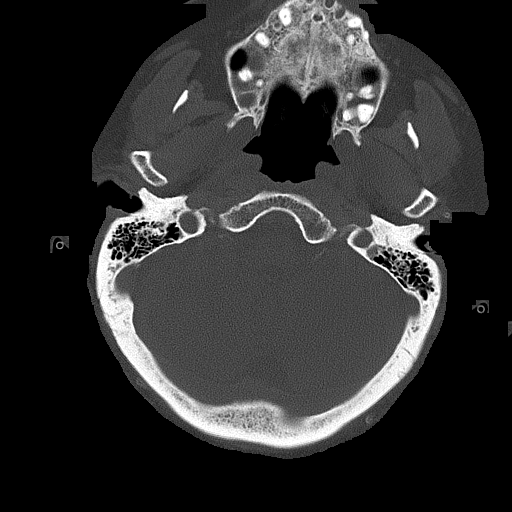
[im 30/89  bone]
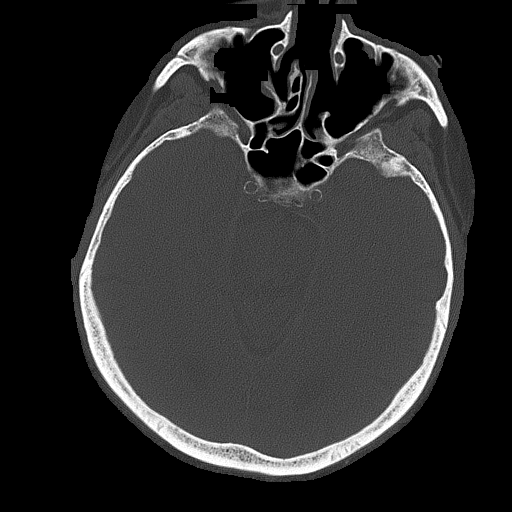
[im 38/89  bone]
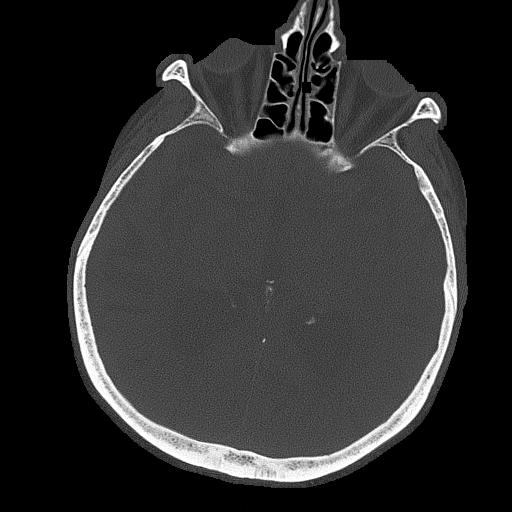

[Series 5: head without cor · coronal · non-contrast · 0.34mm/px · 3 of 67 slices shown]
[im 23/67  brain]
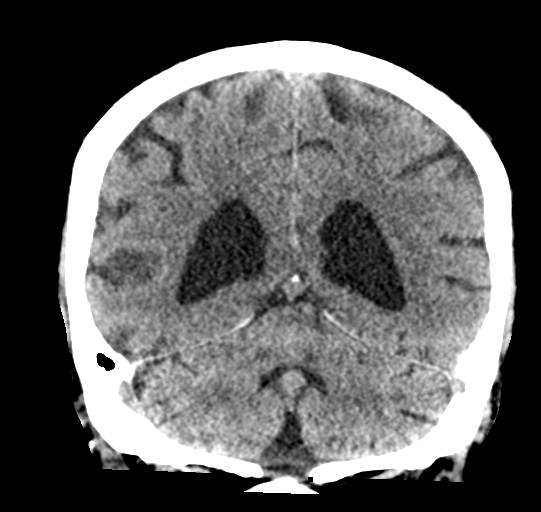
[im 30/67  brain]
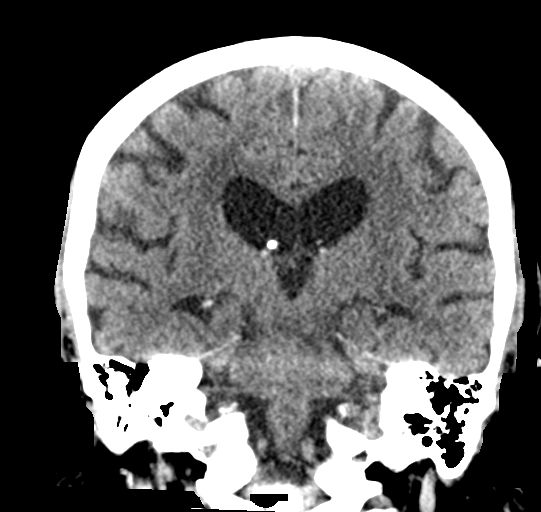
[im 37/67  brain]
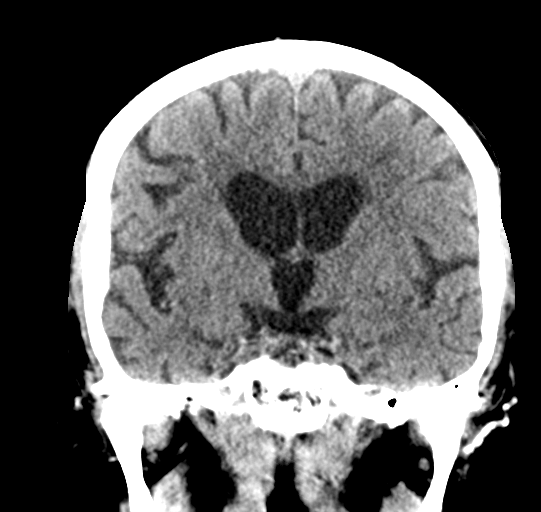

[Series 6: head without sag · sagittal · non-contrast · 0.34mm/px · 3 of 62 slices shown]
[im 21/62  brain]
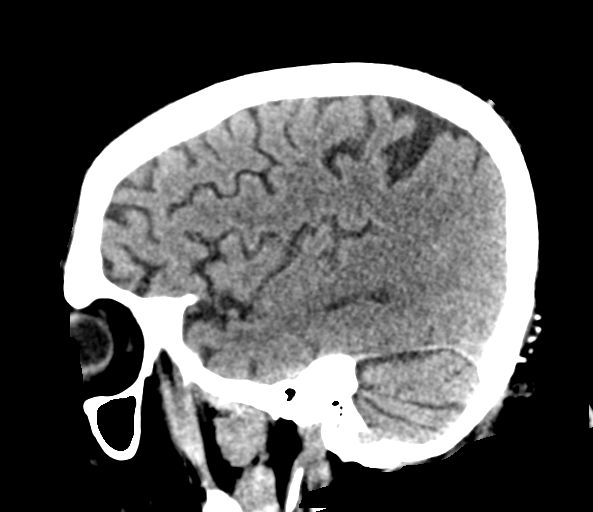
[im 31/62  brain]
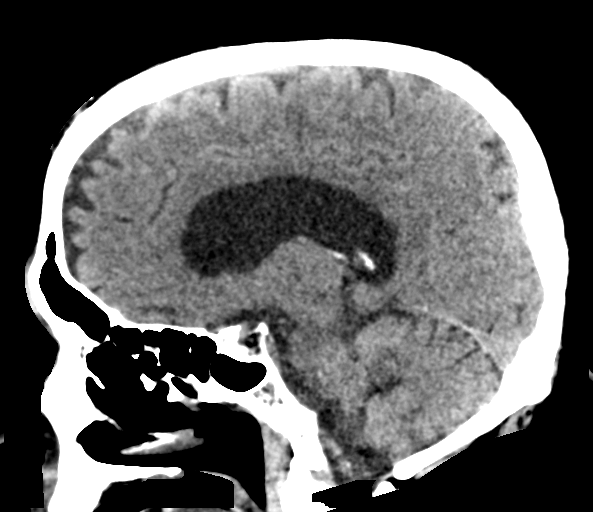
[im 41/62  brain]
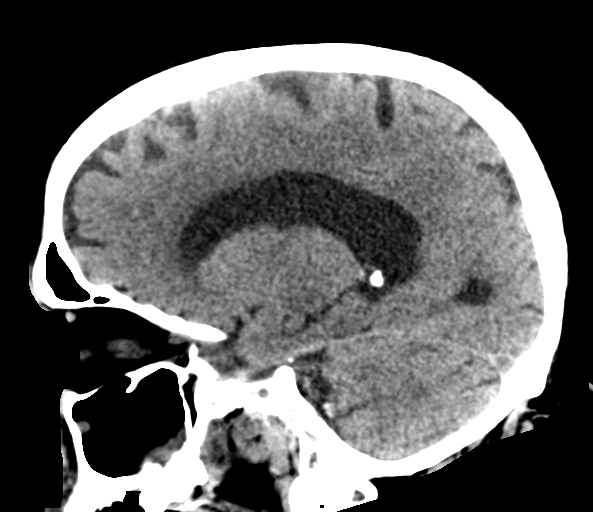

[16 of 47 positions shown; findings below may reference images not displayed]

FINDINGS: Brain: No evidence of acute infarction, hemorrhage, hydrocephalus,
extra-axial collection or mass lesion/mass effect. Scattered
low-density changes within the periventricular and subcortical white
matter compatible with chronic microvascular ischemic change. Mild
diffuse cerebral volume loss.

Vascular: Atherosclerotic calcifications involving the large vessels
of the skull base. No unexpected hyperdense vessel.

Skull: Normal. Negative for fracture or focal lesion.

Sinuses/Orbits: Mild mucosal thickening within the maxillary sinuses
and ethmoid air cells.

Other: None.
IMPRESSION: 1. No acute intracranial abnormality.
2. Chronic microvascular ischemic change and cerebral volume loss.

## 2022-03-28 MED ORDER — SODIUM CHLORIDE 0.9 % IV BOLUS
1000.0000 mL | Freq: Once | INTRAVENOUS | Status: AC
  Administered 2022-03-28: 1000 mL via INTRAVENOUS

## 2022-03-28 MED ORDER — DEXTROSE-NACL 5-0.9 % IV SOLN: INTRAVENOUS | Status: DC

## 2022-03-28 MED ORDER — ACETAMINOPHEN 10 MG/ML IV SOLN
1000.0000 mg | Freq: Four times a day (QID) | INTRAVENOUS | Status: AC
  Administered 2022-03-28 – 2022-03-29 (×3): 1000 mg via INTRAVENOUS
  Filled 2022-03-28 (×4): qty 100

## 2022-03-28 NOTE — Significant Event (Signed)
Rapid Response Event Note  ? ?Reason for Call :  ?Lethargy ? ?Initial Focused Assessment:  ?Pt lying in bed with eyes closed. He opens eyes easily to verbal command, follows commands(though weak), and moves all extremities. Pt speaks very quietly and has delay responses. He is oriented to person and place.  ? ?T-99, HR-95, RR-18, SpO2-98% on 2L [place]. ? ? ?Interventions:  ?No RRT interventions needed at this time. ? ?Plan of Care:  ?Pt has been lethargic and confused post-op. He had labs and a CT on day shift which were unrevealing to the cause of this. This presentation is in the setting of infection and recent anesthesia.  IVF ordered on day shift and continue at 100cc/hr. Continue to monitor pt closely. Call RRT if further assistance needed.  ? ?Event Summary:  ? ?MD Notified:  ?Call [phone] ?Arrival PYPP:5093 ?End OIZT:2458 ? ?Terrilyn Saver, RN ?

## 2022-03-28 NOTE — Significant Event (Signed)
Rapid Response Event Note  ? ?Reason for Call :  ?Difficult to arouse ? ?Initial Focused Assessment:  ?Patient is lying in bed with his eyes open.  He can answer questions and follow simple commands.  But he is very weak and fatigued.  He vocalizes very quietly. ?Lung sounds decreased through out ?Heart tones regular ? ?110/72, 100/59  HR 90s,  RR 20  O2 sat 100% on RA ? ?He does have dome left side weakness, which is his baseline since his prior CVA, per his wife. ? ?Wife at bedside ?Spoke with Dr Jerral Ralph via phone ? ?Interventions:  ?ABG ?Labs  ? ?Plan of Care:  ?RN to follow up with MD regarding lab results. ? ? ?Event Summary:  ? ?MD Notified: Dorcas Carrow ?Call Time: [phone] ?Arrival Time: [phone] ?End Time: 1030 ? ?Marcellina Millin, RN ?

## 2022-03-28 NOTE — Progress Notes (Signed)
OT Cancellation Note ? ?Patient Details ?Name: Rasheen Bells ?MRN: [id number] ?DOB: 08-Mar-1961 ? ? ?Cancelled Treatment:    Reason Eval/Treat Not Completed: Patient at procedure or test/ unavailable/ Transport taking pt off unit for CT, RN also reports that pt has been very lethargic and sleeping heavily today and request therapy follow up tomorrow ? ?Galen Manila ?03/28/2022, 12:09 PM ?

## 2022-03-28 NOTE — Progress Notes (Signed)
Patient ID: Johnathan Keith, male   DOB: [birth year], 61 y.o.   MRN: [id number] ?Patient is postoperative day 1 right transtibial amputation.  He was up all night and is resting comfortably this morning.  His wife mentions that [facility].  Patient's wife would like him to return to [facility] for his postoperative care.  We will rely on physical therapy for their recommendations for discharge planning.  There is no drainage in the wound VAC canister. ?

## 2022-03-28 NOTE — Progress Notes (Signed)
PT Cancellation Note ? ?Patient Details ?Name: Johnathan Keith ?MRN: [id number] ?DOB: 02/16/1961 ? ? ?Cancelled Treatment:    Reason Eval/Treat Not Completed: Patient at procedure or test/unavailable (CT); RN also notes pt less responsive today. Will follow-up for PT treatment tomorrow; wife aware. ? ?Mabeline Caras, PT, DPT ?Acute Rehabilitation Services  ?Pager [phone] ?Office [phone] ? ?[name] ?03/28/2022, 12:09 PM ?

## 2022-03-28 NOTE — Progress Notes (Signed)
?PROGRESS NOTE ? ? ? ?Johnathan Keith  IBB:[id number] DOB: 26-Jun-1961 DOA: 03/20/2022 ?PCP: Olive Bass, MD  ? ? ?Brief Narrative:  ?61 year old gentleman with severe, multiple underlying comorbidities with history of coronary artery disease status post PCI now on dual antiplatelet therapy, history of stroke with generalized weakness right more than left, wheelchair-bound, hypertension, COPD, GERD, type 2 diabetes on insulin and diabetic polyneuropathy who was suffering from right heel nonhealing wound for about a year, seen at [facility] and found to have localized abscess and sent to [place].   he was admitted to [facility] long for last 4 days, recommended transfer to [facility] for surgical intervention. ?Also complains of gradual vision loss in the left eye.  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  Overall very poor clinical status. ? ? ?Assessment & Plan: ?  ?Right heel abscess and cellulitis, nonhealing wound: MRI consistent with calcaneal osteomyelitis. ?Blood cultures negative.  Treated with broad-spectrum antibiotics for more than 1 week. ?Amputation to clean margin 4/19, will discontinue antibiotics today. ? ?Acute metabolic encephalopathy, multifactorial.  Infection and sedation in a patient with chronic illnesses. ?Rapid response 4/20, patient is less responsive.  Patient has baseline lethargy and deconditioning. ?Hemodynamically stable.  Poor response, however following simple commands. ?Electrolytes, LFTs normal.  Ammonia 26.  WBC 17.3, however expected from surgical stress.  Blood gas fairly normal, no evidence of hypercapnia.  Patient on room air. ?CT head, ordered.  Pending. ?Recent MRI of the brain with no acute events. ?Symptomatic treatment, IV fluid and support. ? ?Type 2 diabetes with diabetic nephropathy, long-term insulin use with hyperglycemia: Hemoglobin A1c 9.5.  Currently on Lantus and prandial insulin.  Blood sugars acceptable today.   ?Patient  with lethargy, no oral intake.  Discontinue long-acting insulin today.   ? ?History of stroke, generalized debility, right hemiparesis, right sided blindness: ?Chronic neurological deficit but patient complained of gradual loss of vision on the left eye also. ?Seen by ophthalmology on 4/6 in the office, notes were reviewed.  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 Continue Cosopt eyedrops. ?Ophthalmologist had recommended echocardiogram and angiogram for retinal artery stenosis, ?MR angiogram of the head with no occlusive lesion, thrombus.  Echocardiogram with normal intracardiac thrombus.  ?This is likely progressive visual loss, unsure any reversibility. Discussed this with patient and wife at the bedside. ? ?Essential hypertension: Blood pressure stable on Proscar.  Continue. ? ?coronary artery disease with history of MI: On aspirin, Plavix, Lipitor and metoprolol.   ? ?Acute kidney injury: Baseline not available.  Creatinine fluctuations.  Continue to monitor on multiple antibiotics.  Antibiotics discontinued today. ?Bolus IV fluid followed by maintenance fluid.  Avoid hypoglycemia.  Intake and output monitoring. ? ?Goal of care: ?Very poor baseline status, bedbound.  Multiple previous discussions about hospice and DNR. ?Patient has difficulty coping and recovering from multiple illnesses and surgery.  Today with altered mental status, likely multifactorial with no reversible cause.  Continue supportive care. ?Overall poor outcome, difficulty to treat conditions and for recovery if any CPR needed discussed with wife at the bedside.  She is a Lawyer and works at a memory care unit. ?Patient's wife tells me that he wants to be treated as well as they want him to get CPR, intubation, prolonged life support.  She is agreeable to discuss with palliative care to get more counseling, palliative care consulted. ? ?DVT prophylaxis: SCD's Start: 03/27/22 2033 ?enoxaparin (LOVENOX)  injection 40 mg  Start: 03/21/22 2200 ? ? ?Code Status: Full code ?Family Communication: Wife at the bedside. ?Disposition Plan: Status is: Inpatient ?Remains inpatient appropriate because: Altered mental status. ?  ? ? ?Consultants:  ?Dr. Lajoyce Corners ? ?Procedures:  ?None ? ?Antimicrobials:  ?Vancomycin, cefepime and Flagyl 4/12--- ? ? ?Subjective: ? ?Patient seen and examined.  Early morning events noted.  Rapid response was called with patient being less responsive.  Vitals were normal.  He has episodic jerking movements.  Responds to voice, follows simple commands with weakness.  No episodes of seizures. ?Blood gas analysis, ammonia and electrolytes are normal. ?CT head pending. ? ?Objective: ?Vitals:  ? 03/28/22 0422 03/28/22 2505 03/28/22 0838 03/28/22 1003  ?BP: 104/72 110/72  (!) 100/59  ?Pulse: 91 92  97  ?Resp: 17 17  20   ?Temp: 97.9 ?F (36.6 ?C) 98.4 ?F (36.9 ?C)    ?TempSrc:  Oral    ?SpO2: 99% 96% 96% 100%  ?Weight:      ?Height:      ? ? ?Intake/Output Summary (Last 24 hours) at 03/28/2022 1257 ?Last data filed at 03/28/2022 03/30/2022 ?Gross per 24 hour  ?Intake 2187.47 ml  ?Output 100 ml  ?Net 2087.47 ml  ? ?Filed Weights  ? 03/20/22 2117 03/21/22 0025  ?Weight: 90.2 kg 93.2 kg  ? ? ?Examination: ? ?General exam: Frail.  Debilitated.  Sick looking.  On room air.  ?Lethargic but following simple commands with difficulties. ?Respiratory system: Clear to auscultation. Respiratory effort normal.  No added sounds. ?Cardiovascular system: S1 & S2 heard, RRR.  ?Gastrointestinal system: Soft.  Nontender.  Bowel sound present. ?Central nervous system: Follows simple commands.  Generalized weakness.  Right upper extremity weaker than left upper extremity. ?Extremities: Generalized weakness.  Right upper and lower extremity more weaker than left. ?Right leg with below-knee amputation stump, on postop dressing and knee extender, not removed by me. ? ? ? ?Data Reviewed: I have personally reviewed following labs and imaging  studies ? ?CBC: ?Recent Labs  ?Lab 03/22/22 ?0616 03/23/22 ?0955 03/24/22 ?0626 03/28/22 ?1013  ?WBC 12.1* 9.9 9.3 17.3*  ?NEUTROABS 8.8* 6.9 5.7 13.5*  ?HGB 9.7* 9.4* 10.4* 8.8*  ?HCT 31.2* 30.1* 33.2* 28.1*  ?MCV 97.5 97.1 97.6 97.9  ?PLT 316 303 299 251  ? ?Basic Metabolic Panel: ?Recent Labs  ?Lab 03/22/22 ?03/24/22 03/23/22 ?0955 03/24/22 ?03/26/22 03/27/22 ?03/29/22 03/28/22 ?1013  ?NA 138 138 140 138 138  ?K 4.2 4.0 4.1 4.1 4.4  ?CL 105 107 107 109 111  ?CO2 25 25 25  20* 19*  ?GLUCOSE 205* 215* 189* 146* 90  ?BUN 29* 28* 27* 35* 39*  ?CREATININE 1.37* 1.39* 1.36* 1.68* 1.92*  ?CALCIUM 9.1 9.2 9.4 9.6 9.1  ? ?GFR: ?Estimated Creatinine Clearance: 42.7 mL/min (A) (by C-G formula based on SCr of 1.92 mg/dL (H)). ?Liver Function Tests: ?Recent Labs  ?Lab 03/28/22 ?1013  ?AST 18  ?ALT 9  ?ALKPHOS 50  ?BILITOT 0.7  ?PROT 7.1  ?ALBUMIN 2.6*  ? ?No results for input(s): LIPASE, AMYLASE in the last 168 hours. ?Recent Labs  ?Lab 03/28/22 ?1009  ?AMMONIA 26  ? ?Coagulation Profile: ?No results for input(s): INR, PROTIME in the last 168 hours. ?Cardiac Enzymes: ?No results for input(s): CKTOTAL, CKMB, CKMBINDEX, TROPONINI in the last 168 hours. ?BNP (last 3 results) ?No results for input(s): PROBNP in the last 8760 hours. ?HbA1C: ?No results for input(s): HGBA1C in the last 72 hours. ?CBG: ?Recent Labs  ?Lab 03/27/22 ?1648 03/27/22 ?1933 03/27/22 ?2055 03/28/22 ?2056 03/28/22 ?  1123  ?GLUCAP 89 83 83 103* 78  ? ?Lipid Profile: ?No results for input(s): CHOL, HDL, LDLCALC, TRIG, CHOLHDL, LDLDIRECT in the last 72 hours. ?Thyroid Function Tests: ?No results for input(s): TSH, T4TOTAL, FREET4, T3FREE, THYROIDAB in the last 72 hours. ?Anemia Panel: ?No results for input(s): VITAMINB12, FOLATE, FERRITIN, TIBC, IRON, RETICCTPCT in the last 72 hours. ?Sepsis Labs: ?No results for input(s): PROCALCITON, LATICACIDVEN in the last 168 hours. ? ?Recent Results (from the past 240 hour(s))  ?Culture, blood (Routine X 2) w Reflex to ID Panel      Status: None  ? Collection Time: 03/21/22  8:10 PM  ? Specimen: BLOOD  ?Result Value Ref Range Status  ? Specimen Description   Final  ?  BLOOD BLOOD LEFT ARM ?Performed at [facility], [address]

## 2022-03-28 NOTE — Progress Notes (Signed)
Rapid response call patient is very lethargic A&O 1 to self very confused did not give meds  ?

## 2022-03-28 NOTE — Progress Notes (Signed)
[facility] informed of due to lethargic state po medications were held. Md in agreement to hold medications tonight. Informed the wife at bedside if she noticed any changes to call my telephone number on the board. Will continue to monitor. Ilean Skill LPN ?

## 2022-03-29 DIAGNOSIS — Z7189 Other specified counseling: Secondary | ICD-10-CM

## 2022-03-29 DIAGNOSIS — L089 Local infection of the skin and subcutaneous tissue, unspecified: Secondary | ICD-10-CM | POA: Diagnosis not present

## 2022-03-29 DIAGNOSIS — Z515 Encounter for palliative care: Secondary | ICD-10-CM | POA: Diagnosis not present

## 2022-03-29 LAB — GLUCOSE, CAPILLARY
Glucose-Capillary: 137 mg/dL — ABNORMAL HIGH (ref 70–99)
Glucose-Capillary: 175 mg/dL — ABNORMAL HIGH (ref 70–99)
Glucose-Capillary: 182 mg/dL — ABNORMAL HIGH (ref 70–99)
Glucose-Capillary: 177 mg/dL — ABNORMAL HIGH (ref 70–99)

## 2022-03-29 LAB — SURGICAL PATHOLOGY

## 2022-03-29 NOTE — Progress Notes (Signed)
Patient ID: Johnathan Keith, male   DOB: 1961/05/29, 61 y.o.   MRN: [id number] ?Patient more alert this morning talking.  Patient is still bedbound and not moving.  Patient's wife is for discharge to home with [facility].  Patient does not seem appropriate at this time for home and care at [facility]. ?

## 2022-03-29 NOTE — Progress Notes (Addendum)
Palliative Medicine Team meeting with patient/spouse today for goals of care.  Wife would prefer that patient discharge home and continue in the Stay Well Program through [facility].   ?I spoke with Clydie Braun at [facility], phone [phone]; she states that [facility] can provide palliative follow up and are contracted with [facility], should patient progress to needing Home Hospice.  Information communicated with Isabella Bowens with PMT.  ? ?Quintella Baton, RN, BSN  ?Trauma/Neuro ICU Case Manager ?[phone]  ?

## 2022-03-29 NOTE — Progress Notes (Signed)
?PROGRESS NOTE ? ?Johnathan Keith  ?DOB: 10-30-1961  ?PCP: Algis Greenhouse, MD ?[id number]  ?DOA: 03/20/2022 ? LOS: 9 days  ?Hospital Day: 10 ? ?Brief narrative: ?Johnathan Keith is a 60 y.o. male with PMH significant for DM2, HTN, HLD, CAD/stent, h/o stroke with generalized weakness, vision impairment, wheelchair-bound status, COPD, GERD, polyneuropathy, diabetic foot who lives at home. ?Patient was seen at [facility] and found to have localized abscess and transferred to [facility].   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   ?Overall very poor clinical status. ? ?Subjective: ?Patient was seen and examined this morning.  Middle-aged Caucasian male.  Propped up in bed.  Alert, awake, slow to respond, oriented to place. ?Wife not at bedside ?Vital signs stable.  Breathing on room air ?Blood glucose this morning 137.   ?Last set of blood work from 4/20 with WC count 16.6, hemoglobin 8.6, BUN/creatinine 39/1.92 ?4/20, CT scan of head did not show any acute intracranial malady.  Chronic microvascular ischemic changes. ? ? ?Principal Problem: ?  Right foot infection ?Active Problems: ?  Type 2 diabetes mellitus with diabetic neuropathy, with long-term current use of insulin ([place]) ?  Acute kidney injury ([place]) ?  Coronary artery disease with history of myocardial infarction without history of CABG ?  Hypertension ?  Pressure injury of skin ?  ? ?Assessment and Plan: ?Right heel abscess and cellulitis, nonhealing wound:  ?-MRI consistent with calcaneal osteomyelitis. ?-Blood cultures negative. ?-4/19, underwent right BKA, pathology showed clear margins. Treated with broad-spectrum antibiotics for more than 1 week.  Antibiotics stopped. ?  ?Acute metabolic encephalopathy ?-4/20, rapid response was called as patient was less responsive. ?-Patient has poor baseline physical and mental status to begin with.  Mental status worsened probably because of infection and sedation. ?-4/20, CT head did not show any acute  intracranial abnormality.  Ammonia level 26.  Electrolytes normal.  Blood gas without evidence of hypercapnia. ?-On my evaluation today, patient was alert, awake, slow to respond.  Able to tell me his name and oriented to place.  Able to follow motor commands but is slow. ?-Continue to monitor mental status change. ? ?Uncontrolled type 2 diabetes mellitus with hyperglycemia ?Diabetic neuropathy ?-A1c 9.5 on 03/21/2022 ?-Currently on sliding scale insulin with Accu-Cheks.  4/20, Semglee was discontinued because of lethargy and poor oral intake ?-Blood sugar level remains consistently less than 150. ?Recent Labs  ?Lab 03/28/22 ?1557 03/28/22 ?1812 03/28/22 ?1957 03/29/22 ?[id number] 03/29/22 ?1119  ?GLUCAP 94 103* 108* 137* 175*  ? ?History of stroke, generalized debility, right hemiparesis, right sided blindness ?-Patient has above-mentioned chronic neurological deficits and also experiencing gradual loss of vision in the left eye.  Reportedly seen by ophthalmology on 4/6 in the office, showed diabetic proliferative retinopathy and increased intraocular pressure.  ?-Per recommendation by ophthalmologist, echocardiogram and angiogram were obtained to rule out retinal artery stenosis ?-4/17 MR angiogram did not show any large vessel occlusion.  Showed mild to moderate scattered intracranial atherosclerosis. ?-4/17, echocardiogram with EF 55 to 60%, no wall stable malady, G1DD ?-Continue Cosopt eyedrops. ? ?Essential hypertension ?-Blood pressure stable on Proscar.  Continue. ?  ?CAD/MI ?-Continue aspirin, Plavix, Lipitor and metoprolol.   ?  ?Acute kidney injury ?-Baseline not available. Creatinine steadily rising up.  1.92 today.  Plan because of poor oral intake. ?-Hold torsemide, Aldactone and scheduled potassium. ?-Continue D5 NS at 100 mill per hour. ?Recent Labs  ?  03/20/22 ?2116 03/21/22 ?[id number] 03/22/22 ?[id number] 03/23/22 ?[id number] 03/24/22 ?[id number] 03/27/22 ?[id number] 03/28/22 ?  1013  ?BUN 29* 30* 29* 28* 27* 35* 39*  ?CREATININE  1.34* 1.03 1.37* 1.39* 1.36* 1.68* 1.92*  ?  ?Goal of care: ?Very poor baseline status, bedbound.   ?Patient has difficulty coping and recovering from multiple illnesses and surgery. Continue supportive care. ?Overall poor outcome,  ?Per previous hospitalist, multiple previous discussions were held about hospice and DNR.  Patient's wife is a [employer] manager and works at a memory care unit.  She wants him full code and to continue aggressive cares.  Palliative care consult appreciated. ? ?Goals of care ?  Code Status: Full Code  ? ? ?Mobility: Limited mobility.  Seen by PT. ? ?Skin assessment:  ?Pressure Injury 03/20/22 Buttocks Medial;Left;Right Stage 1 -  Intact skin with non-blanchable redness of a localized area usually over a bony prominence. (Active)  ?03/20/22 2113  ?Location: Buttocks  ?Location Orientation: Medial;Left;Right  ?Staging: Stage 1 -  Intact skin with non-blanchable redness of a localized area usually over a bony prominence.  ?Wound Description (Comments):   ?Present on Admission: Yes  ? ? ?Nutritional status:  ?Body mass index is 33.16 kg/m?.  ?  ?  ? ? ? ? ?Diet:  ?Diet Order   ? ?       ?  Diet Carb Modified Fluid consistency: Thin; Room service appropriate? Yes  Diet effective now       ?  ? ?  ?  ? ?  ? ? ?DVT prophylaxis:  ?SCD's Start: 03/27/22 2033 ?enoxaparin (LOVENOX) injection 40 mg Start: 03/21/22 2200 ?  ?Antimicrobials: Completed course of antibiotics ?Fluid: D5 NS at 75 mill per hour ?Consultants: Palliative care ?Family Communication: None at bedside ? ?Status is: Inpatient ? ?Continue in-hospital care because: Pending clinical course, pending PT eval ?Level of care: Med-Surg  ? ?Dispo: The patient is from: Home ?             Anticipated d/c is to: Pending PT eval ?             Patient currently is not medically stable to d/c. ?  Difficult to place patient No ? ? ? ? ?Infusions:  ? acetaminophen 1,000 mg (03/29/22 1224)  ? dextrose 5 % and 0.9% NaCl 100 mL/hr at 03/28/22 2209  ? magnesium  sulfate bolus IVPB    ? ? ?Scheduled Meds: ? allopurinol  100 mg Oral Daily  ? vitamin C  1,000 mg Oral Daily  ? aspirin EC  81 mg Oral Daily  ? atorvastatin  80 mg Oral Daily  ? bethanechol  10 mg Oral TID  ? budesonide  0.25 mg Nebulization BID  ? carbidopa-levodopa  0.5 tablet Oral BID  ? cholecalciferol  1,000 Units Oral Q1200  ? clopidogrel  75 mg Oral Daily  ? docusate sodium  100 mg Oral Daily  ? dorzolamide-timolol  1 drop Both Eyes BID  ? enoxaparin (LOVENOX) injection  40 mg Subcutaneous QHS  ? ferrous sulfate  325 mg Oral Daily  ? finasteride  5 mg Oral Daily  ? gabapentin  100 mg Oral BID  ? insulin aspart  0-15 Units Subcutaneous TID WC  ? insulin aspart  0-5 Units Subcutaneous QHS  ? levothyroxine  25 mcg Oral QAC breakfast  ? lipase/protease/amylase  36,000 Units Oral BID WC  ? mirtazapine  15 mg Oral QHS  ? montelukast  10 mg Oral QHS  ? multivitamin  1 tablet Oral Daily  ? nutrition supplement (JUVEN)  1 packet Oral BID BM  ?  pantoprazole  40 mg Oral Daily  ? tamsulosin  0.4 mg Oral QPC supper  ? zinc sulfate  220 mg Oral Daily  ? ? ?PRN meds: ?alum & mag hydroxide-simeth, bisacodyl, guaiFENesin-dextromethorphan, hydrALAZINE, HYDROcodone-acetaminophen, HYDROcodone-acetaminophen, HYDROmorphone (DILAUDID) injection, labetalol, magnesium sulfate bolus IVPB, metoprolol tartrate, ondansetron (ZOFRAN) IV, phenol, polyethylene glycol, potassium chloride  ? ?Antimicrobials: ?Anti-infectives (From admission, onward)  ? ? Start     Dose/Rate Route Frequency Ordered Stop  ? 03/27/22 2200  ceFEPIme (MAXIPIME) 2 g in sodium chloride 0.9 % 100 mL IVPB  Status:  Discontinued       ? 2 g ?200 mL/hr over 30 Minutes Intravenous Every 12 hours 03/27/22 1303 03/28/22 1002  ? 03/27/22 2130  ceFAZolin (ANCEF) IVPB 2g/100 mL premix       ? 2 g ?200 mL/hr over 30 Minutes Intravenous Every 8 hours 03/27/22 2032 03/28/22 1730  ? 03/27/22 1303  vancomycin variable dose per unstable renal function (pharmacist dosing)  Status:   Discontinued       ?  Does not apply See admin instructions 03/27/22 1303 03/28/22 1002  ? 03/27/22 0915  ceFAZolin (ANCEF) IVPB 2g/100 mL premix  Status:  Discontinued       ? 2 g ?200 mL/hr over 30 Minutes [place]

## 2022-03-29 NOTE — Consult Note (Signed)
?Palliative Medicine Inpatient Consult Note ? ?Consulting Provider: Barb Merino, MD ? ?Reason for consult:   ?[facility] Consult  ?Reason for Consult? goal of care , education, counselling  ? ?HPI:  ?Per intake H&P --> 61 year old gentleman with severe, multiple underlying comorbidities with history of coronary artery disease status post PCI now on dual antiplatelet therapy, history of stroke with generalized weakness right more than left, wheelchair-bound, hypertension, COPD, GERD, type 2 diabetes on insulin and diabetic polyneuropathy who was suffering from right heel nonhealing wound for about a year, seen at [facility] and found to have localized abscess and sent to [facility].   he was admitted to [facility] for last 4 days, recommended transfer to [facility] for surgical intervention. ?Also complains of gradual vision loss in the left eye.  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  Overall very poor clinical status. ? ?Palliative care was asked to get involved to further address goals of care in the setting of multiple chronic disease processes. S/P  right transtibial amputation on this admission with poor baseline level of functioning.  ? ?Clinical Assessment/Goals of Care: ? ?*Please note that this is a verbal dictation therefore any spelling or grammatical errors are due to the "[place] [employer]" system interpretation. ? ?I have reviewed medical records including EPIC notes, labs and imaging, received report from bedside RN, assessed the patient. Johnathan Keith is confused tearful throughout the interview with him.  ?  ?I met with Johnathan Keith to further discuss diagnosis prognosis, GOC, EOL wishes, disposition and options. ?  ?I introduced Palliative Medicine as specialized medical care for people living with serious illness. It focuses on providing relief from the symptoms and stress of a serious illness. The goal  is to improve quality of life for both the patient and the family. ? ?Medical History Review and Understanding: ? ?We reviewed Johnathan Keith's history of prior stroke-his largest one occurring in 2014 followed by sequential TIAs estimated to be 5 and total.  Discussed that Johnathan Keith has been wheelchair-bound for many years now.  Reviewed his history of COPD, type 2 diabetes, neuropathy which caused a nonhealing right heel ulceration.  Discussed his recent transtibial amputation.  His wife shares that since Johnathan Keith's amputation he has been more altered.  In the past when Johnathan Keith has been hospitalized he has been altered until he gets home and then after series of days at home he tends to clear up mentally. ? ?Social History: ? ?Johnathan Keith and his wife live in [place], [place].  They have been married for 42 years and share 5 children, 12 grandchildren and 3 great-grandchildren.  At their home they have chickens, dogs, and a cat.  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  He had been doing this job since the age of 61 years old and worked his way up the ladder.  Johnathan Keith used to love playing piano for his church practices within the [employer] den[employer]. ? ?Functional and Nutritional State: ? ?Johnathan Keith is a member of the [facility] "Stay Well" program.  His wife shares that Johnathan Keith is not able to perform basic activities of daily living for himself as she does everything inclusive of providing bird baths, dressing, setting up in the wheelchair and preparing him to go to daycare 5 days a week. ? ?Johnathan Keith has a fairly decent appetite ? ?Advance Directives: ? ?A detailed discussion was had today regarding  advanced directives.  Johnathan Keith has never completed advanced directives.  I reviewed the importance of these with he and his spouse and provided a copy for review and completion in the near future. ? ?Code Status: ? ?Concepts specific to code status, artifical feeding and hydration, continued IV antibiotics  and rehospitalization was had.  The difference between a aggressive medical intervention path  and a palliative comfort care path for this patient at this time was had.  Johnathan Keith and his spouse review that multiple providers have expressed concerns about full resuscitation efforts on him and his poor prognosis thereafter.  Despite this when Johnathan Keith was in his right state of mind per his spouse he was clear about wanting all efforts to continue and maintain life.  I shared that it is important to continue readdressing this every hospital stay because these goals may change as additional information and health decline are identified. ? ?Discussion: ? ?Johnathan Keith was fairly tearful throughout our conversation and consistently perseverated on the wellness of his wife, Johnathan Keith.  It was unclear that he understood the concepts being described.  Johnathan Keith is able to go off of what he is shared with her in the past in terms of CPR and his wishes.  She is aware of the importance of continued conversations.  I did introduce the topic of outpatient palliative support as it would enable when Johnathan Keith is in a better frame of mind to have additional conversations in the context of his ailments about what his wishes would be.  Johnathan Keith is agreeable to this and asked if it can be provided through the pace program - I did not know the answer to this therefore I will refer to the transitions of care team. ? ?Goals at this time are for improvement and recovery. ? ?[place] spouse, Johnathan Keith is open to outpatient palliative care support. ? ?Discussed the importance of continued conversation with family and their  medical providers regarding overall plan of care and treatment options, ensuring decisions are within the context of the patients values and GOCs. ? ?Decision Maker: ?Johnathan Keith (spouse) [phone] ? ?SUMMARY OF RECOMMENDATIONS   ?Full Code/ Full Scope of care presently ? ?A copy of advance directives were provided for review and  completion ? ?Outpatient palliative care support upon discharge -have asked transitions of care team if this could be provided by pace program ? ?Goals are for improvement and recovery -patient's spouse, Johnathan Keith would prefer if Johnathan Keith could be discharged home and continue in the stay well program through [facility] where he could get PT/OT ? ?Ongoing incremental palliative care support on this hospital stay ? ?Code Status/Advance Care Planning: ?FULL CODE ? ?Palliative Prophylaxis:  ?Aspiration, Bowel Regimen, Delirium Protocol, Frequent Pain Assessment, Oral Care, Palliative Wound Care, and Turn Reposition ? ?Additional Recommendations (Limitations, Scope, Preferences): ?Full Scope Treatment ? ?Psycho-social/Spiritual:  ?Desire for further Chaplaincy support: Yes-patient is very faithful ?Additional Recommendations: Education on chronic disease processes and advance care planning ?  ?Prognosis: Patient has 3(+) chronic disease processes placing him at a high 60-monthmortality risk. ? ?Discharge Planning: Discharge ideally would be to home with outpatient palliative support. ? ?Vitals:  ? 03/28/22 2127 03/29/22 0403  ?BP:  118/60  ?Pulse: 95 88  ?Resp: 18 17  ?Temp:  98 ?F (36.7 ?C)  ?SpO2: 98% 100%  ? ? ?Intake/Output Summary (Last 24 hours) at 03/29/2022 0703 ?Last data filed at 03/28/2022 1501 ?Gross per 24 hour  ?Intake 1467.7 ml  ?Output 400 ml  ?Net 1067.7 ml  ? ?  Last Weight  Most recent update: 03/21/2022  2:03 AM  ? ? Weight  ?93.2 kg (205 lb 7.5 oz)  ?      ? ?  ? ?Gen: Older Caucasian male in no acute distress ?HEENT: moist mucous membranes ?CV: Regular rate and rhythm ?PULM: On 1 L nasal cannula breathing is even and nonlabored ?ABD: soft/nontender ?EXT: Right lower extremity dressing in place ?Neuro: Patient is disoriented and perseverant ? ?PPS: 30% ? ? ?This conversation/these recommendations were discussed with patient primary care team, Dr. Pietro Cassis ? ?MDM  High ?______________________________________________________ ?Tacey Ruiz ?[facility] Team ?Team Cell Phone: [phone] ?Please utilize secure chat with additional questions, if there is no response within 30 minutes please call the above phone numbe

## 2022-03-29 NOTE — Evaluation (Signed)
Occupational Therapy Evaluation ?Patient Details ?Name: Johnathan Keith ?MRN: [id number] ?DOB: 01/18/1961 ?Today's Date: 03/29/2022 ? ? ?History of Present Illness Pt is a 61 y.o. male admitted 03/20/22 with R calcaneus chronic osteomyelitis; also c/o gradual vision loss L eye. MR angiogram of the head with no occlusive lesion, thrombus. S/p R transtibial amputation with wound vac application 4/19. PMH includes blind R eye, CAD, HTN, CVA, HF, COPD, asthma, DM2, gout, diabetic neuropathy, wheelchair-bound the past ~1 yr.Pt now with significant expressive aphasia and not following all one step commands. Nursing aware.  ? ?Clinical Impression ?  ?Pt admitted with the above diagnosis and has the deficits listed below. Pt would benefit from a short trial of OT to attempt to get pt back to baseline with his feeding and grooming and follow to ensure cognition resolves with new cognitive and language changes.  Pt was dependent with all adls PTA outside of feeding and grooming. Wife uses hoyer lift to get pt up and does all bathing, dressing, and toileting for him in the bed.  Pt now with more weakness in UES and is having difficulty feeding self and grooming. Pt also with a significant new expressive aphasia. Nursing notified and states scans were done yesterday that showed no change but cognition and language as well as UE strength are significantly different. Will follow to address feeding and grooming and will monitor and initiate cognitive rehab if needed.  Feel pt can return home and to adult day center for therapies as wife was doing full care for pt PTA. ? ?  ?   ? ?Recommendations for follow up therapy are one component of a multi-disciplinary discharge planning process, led by the attending physician.  Recommendations may be updated based on patient status, additional functional criteria and insurance authorization.  ? ?Follow Up Recommendations ? Other (comment) (OT at day treatment center to get back to baseline)  ?   ?Assistance Recommended at Discharge Frequent or constant Supervision/Assistance  ?Patient can return home with the following A lot of help with walking and/or transfers;A lot of help with bathing/dressing/bathroom;Assistance with cooking/housework;Assistance with feeding;Direct supervision/assist for medications management;Assist for transportation;Help with stairs or ramp for entrance ? ?  ?Functional Status Assessment ? Patient has had a recent decline in their functional status and demonstrates the ability to make significant improvements in function in a reasonable and predictable amount of time.  ?Equipment Recommendations ? None recommended by OT  ?  ?Recommendations for Other Services Speech consult ? ? ?  ?Precautions / Restrictions Precautions ?Precautions: Fall ?Required Braces or Orthoses: Splint/Cast ?Splint/Cast: RLE BKA splint ?Restrictions ?Weight Bearing Restrictions: No  ? ?  ? ?Mobility Bed Mobility ?Overal bed mobility: Needs Assistance ?Bed Mobility: Rolling ?Rolling: Max assist ?  ?  ?Sit to supine: Total assist, +2 for physical assistance, +2 for safety/equipment ?  ?General bed mobility comments: Pt with difficulty rolling this am. Pt does not ever sit up on side of bed at home.  Pt is either in the hospital bed lying down or he is in his tilt w/c by way of hoyer lift. ?  ? ?Transfers ?Overall transfer level: Needs assistance ?  ?  ?  ?  ?  ?  ?  ?  ?General transfer comment: declined. Pt transfers only by way of hoyer lift and it has been that way for a year. ?  ? ?  ?Balance Overall balance assessment: Needs assistance ?  ?Sitting balance-Leahy Scale: Poor ?Sitting balance - Comments: total assist ?  ?  ?  ?  Standing balance comment: unable ?  ?  ?  ?  ?  ?  ?  ?  ?  ?  ?  ?   ? ?ADL either performed or assessed with clinical judgement  ? ?ADL Overall ADL's : Needs assistance/impaired ?Eating/Feeding: Moderate assistance;Sitting ?Eating/Feeding Details (indicate cue type and reason): Pt  normally independent with feeding self things outside of soup. ?Grooming: Wash/dry hands;Wash/dry face;Oral care;Moderate assistance;Sitting ?  ?Upper Body Bathing: Maximal assistance;Sitting ?  ?Lower Body Bathing: Total assistance;+2 for physical assistance;Bed level ?  ?Upper Body Dressing : Total assistance;Sitting ?  ?Lower Body Dressing: Total assistance;+2 for physical assistance;Bed level ?  ?Toilet Transfer: Total assistance;+2 for physical assistance ?Toilet Transfer Details (indicate cue type and reason): all toileting at home done in bed ?Toileting- Clothing Manipulation and Hygiene: Total assistance;+2 for physical assistance;Bed level ?Toileting - Clothing Manipulation Details (indicate cue type and reason): this is baseline ?  ?Tub/Shower Transfer Details (indicate cue type and reason): Pt does not get in shower or tub ?Functional mobility during ADLs: Total assistance;+2 for physical assistance ?General ADL Comments: Pt is normally very dependent with adls. Pt was able to assist PTA with feeding and grooming.  ? ? ? ?Vision Baseline Vision/History: 2 Legally blind (R eye legally blind) ?Ability to See in Adequate Light: 1 Impaired ?Patient Visual Report: No change from baseline ?Vision Assessment?: Vision impaired- to be further tested in functional context ?Additional Comments: Pt is blind in R eye.  Wife says he may not see you standing on the R side.  Further evaluation needed.  ?   ?Perception   ?  ?Praxis   ?  ? ?Pertinent Vitals/Pain Pain Assessment ?Pain Assessment: Faces ?Faces Pain Scale: Hurts a little bit ?Pain Location: RLE and R and L shoudlers ?Pain Descriptors / Indicators: Discomfort ?Pain Intervention(s): Monitored during session, Repositioned  ? ? ? ?Hand Dominance Right ?  ?Extremity/Trunk Assessment Upper Extremity Assessment ?Upper Extremity Assessment: RUE deficits/detail;LUE deficits/detail ?RUE Deficits / Details: ROM WFL.  Shoulder 3/5, biceps/triceps 4/5, grip 4/5 ?RUE:  Shoulder pain with ROM ?RUE Sensation: WNL ?RUE Coordination: decreased fine motor;decreased gross motor ?LUE Deficits / Details: ROM WFL. Shoulder 2+/5, biceps 4-/5, grip 3+/5 ?LUE: Shoulder pain with ROM ?LUE Sensation: decreased light touch ?LUE Coordination: decreased fine motor;decreased gross motor ?  ?Lower Extremity Assessment ?Lower Extremity Assessment: Defer to PT evaluation ?  ?Cervical / Trunk Assessment ?Cervical / Trunk Assessment: Normal ?  ?Communication Communication ?Communication: Expressive difficulties;Receptive difficulties ?  ?Cognition Arousal/Alertness: Awake/alert ?Behavior During Therapy: Flat affect ?Overall Cognitive Status: Impaired/Different from baseline ?Area of Impairment: Orientation, Attention, Memory, Following commands, Awareness, Problem solving ?  ?  ?  ?  ?  ?  ?  ?  ?Orientation Level: Time ?Current Attention Level: Sustained ?Memory: Decreased short-term memory ?Following Commands: Follows one step commands inconsistently ?  ?Awareness: Intellectual ?Problem Solving: Slow processing, Decreased initiation, Difficulty sequencing, Requires verbal cues, Requires tactile cues ?General Comments: Wife states pt can carry on normal conversations at home. Pt very expressive aphasic this am unable to communicate some of his needs. ?  ?  ?General Comments  Pt educated about pressure relief with bottom, heel and with wound vac cord that runs inside the BKA splint.  Wife understands and has done a great job at home with skin care. ? ?  ?Exercises   ?  ?Shoulder Instructions    ? ? ?Home Living Family/patient expects to be discharged to:: Private residence ?Living Arrangements: Spouse/significant other ?  Available Help at Discharge: Family;Available 24 hours/day ?Type of Home: House ?Home Access: Ramped entrance ?  ?  ?Home Layout: One level ?  ?  ?Bathroom Shower/Tub: Other (comment) (bathes in bed) ?  ?Bathroom Toilet:  (all toileting done in bed) ?  ?  ?Home Equipment: Hospital  bed;Other (comment) (hoyer lift) ?  ?Additional Comments: [facility] to [facility] from 630am-430pm. Is offered therapy at [facility] but pt often refuses per spouse. They have a handicapp accessible van at home. Pt has

## 2022-03-29 NOTE — Plan of Care (Signed)
  Problem: Education: Goal: Knowledge of General Education information will improve Description: Including pain rating scale, medication(s)/side effects and non-pharmacologic comfort measures Outcome: Progressing   Problem: Activity: Goal: Risk for activity intolerance will decrease Outcome: Progressing   Problem: Nutrition: Goal: Adequate nutrition will be maintained Outcome: Progressing   

## 2022-03-29 NOTE — Progress Notes (Signed)
Physical Therapy Treatment ?Patient Details ?Name: Johnathan Keith ?MRN: [id number] ?DOB: [birth year] ?Today's Date: 03/29/2022 ? ? ?History of Present Illness Pt is a 61 y.o. male admitted 03/20/22 with R calcaneus chronic osteomyelitis; also c/o gradual vision loss L eye. MR angiogram of the head with no occlusive lesion, thrombus. S/p R transtibial amputation with wound vac application 4/19. Course complicated by lethargy/AMS post-op 4/20; head CT negative for acute abnormality. PMH includes blind R eye, CAD, HTN, CVA, HF, COPD, asthma, DM2, gout, diabetic neuropathy, wheelchair-bound the past ~1 yr. ?  ?PT Comments  ? ? Pt slowly progressing with mobility, wife continues to note increased lethargy, difficulty carrying conversation and expressive aphasia(?) which is not pt's baseline, per wife. Today, pt requires maxA+2 to totalA for bed-level mobility and ADL tasks. Educ wife on limb guard wear, including donning/doffing and pressure relief; pt with <3/5 functional strength in R residual limb. Pt remains limited by generalized weakness, decreased activity tolerance, poor balance and impaired cognition. Wife plans to have pt return home with full-care provided in addition to adult daycare services; in agreement with plan.  ?   ?Recommendations for follow up therapy are one component of a multi-disciplinary discharge planning process, led by the attending physician.  Recommendations may be updated based on patient status, additional functional criteria and insurance authorization. ? ?Follow Up Recommendations ? Other (return to [facility] services) ?  ?  ?Assistance Recommended at Discharge Frequent or constant Supervision/Assistance  ?Patient can return home with the following A lot of help with walking and/or transfers;A lot of help with bathing/dressing/bathroom;Assistance with cooking/housework;Assist for transportation;Help with stairs or ramp for entrance;Direct supervision/assist for medications  management;Assistance with feeding ?  ?Equipment Recommendations ? None recommended by PT  ?  ?Recommendations for Other Services   ? ? ?  ?Precautions / Restrictions Precautions ?Precautions: Fall;Other (comment) ?Precaution Comments: R residual limb wound vac; bladder incontinence; blind R eye (decreased vision L eye) ?Required Braces or Orthoses: Splint/Cast ?Splint/Cast: R BKA limb guard ?Restrictions ?Weight Bearing Restrictions: Yes ?RLE Weight Bearing: Non weight bearing  ?  ? ?Mobility ? Bed Mobility ?Overal bed mobility: Needs Assistance ?Bed Mobility: Rolling ?Rolling: Max assist, +2 for safety/equipment ?  ?  ?  ?  ?General bed mobility comments: Pt with difficulty rolling this am; maxA+1-2 for movement initiation (initiating better to L-side) and trunk rotation, pt able to minimally assist with use of bed rail. Pt does not ever sit up on side of bed at home.  Pt is either in the hospital bed lying down or he is in his tilt w/c by way of hoyer lift. ?  ? ?Transfers ?  ?  ?  ?  ?  ?  ?  ?  ?  ?General transfer comment: declined. Pt transfers only by way of hoyer lift and it has been that way for a year. ?  ? ?Ambulation/Gait ?  ?  ?  ?  ?  ?  ?  ?  ? ? ?Stairs ?  ?  ?  ?  ?  ? ? ?Wheelchair Mobility ?  ? ?Modified Rankin (Stroke Patients Only) ?  ? ? ?  ?Balance   ?  ?  ?  ?  ?  ?  ?  ?  ?  ?  ?  ?  ?  ?  ?  ?  ?  ?  ?  ? ?  ?Cognition Arousal/Alertness: Awake/alert ?Behavior During Therapy: Flat affect ?Overall Cognitive Status: Impaired/Different from baseline ?  Area of Impairment: Orientation, Attention, Memory, Following commands, Awareness, Problem solving ?  ?  ?  ?  ?  ?  ?  ?  ?Orientation Level: Disoriented to, Time ?Current Attention Level: Sustained ?Memory: Decreased short-term memory ?Following Commands: Follows one step commands inconsistently, Follows one step commands with increased time ?  ?Awareness: Intellectual ?Problem Solving: Slow processing, Decreased initiation, Difficulty  sequencing, Requires verbal cues, Requires tactile cues ?General Comments: Wife states pt can carry on normal conversations at home. Pt very expressive aphasic this am unable to communicate some of his needs. ?  ?  ? ?  ?Exercises Amputee Exercises ?Hip ABduction/ADduction: AAROM, Right, Supine ?Knee Flexion: AROM, Right, Supine (partial range) ?Knee Extension: AROM, Right, Supine (partial range) ?Straight Leg Raises: AAROM, Right, Supine ? ?  ?General Comments General comments (skin integrity, edema, etc.): Pt educated about pressure relief with bottom, heel and with wound vac cord that runs inside the BKA splint. Wife understands and has done a great job at home with skin care. BKA limb guard removed for skin check and readjustment ?  ?  ? ?Pertinent Vitals/Pain Pain Assessment ?Pain Assessment: Faces ?Faces Pain Scale: Hurts a little bit ?Pain Location: RLE and B shoudlers with ROM ?Pain Descriptors / Indicators: Discomfort, Grimacing ?Pain Intervention(s): Monitored during session, Repositioned  ? ? ?Home Living Family/patient expects to be discharged to:: Private residence ?Living Arrangements: Spouse/significant other ?Available Help at Discharge: Family;Available 24 hours/day ?Type of Home: House ?Home Access: Ramped entrance ?  ?  ?  ?Home Layout: One level ?Home Equipment: Hospital bed;Other (comment) (hoyer lift) ?Additional Comments: [facility] to [facility] from 630am-430pm. Is offered therapy at [facility] but pt often refuses per spouse. They have a handicapp accessible van at home. Pt has an electric wc but it doesn't fit in the [place].  ?  ?Prior Function    ?  ?  ?   ? ?PT Goals (current goals can now be found in the care plan section) Progress towards PT goals: Not progressing toward goals - comment (increased fatigue/AMS) ? ?  ?Frequency ? ? ? Min 2X/week ? ? ? ?  ?PT Plan Current plan remains appropriate  ? ? ?Co-evaluation PT/OT/SLP Co-Evaluation/Treatment: Yes ?Reason for Co-Treatment: Complexity of  the patient's impairments (multi-system involvement);Necessary to address cognition/behavior during functional activity;For patient/therapist safety ?PT goals addressed during session: Mobility/safety with mobility ?OT goals addressed during session: ADL's and self-care ?  ? ?  ?AM-PAC PT "6 Clicks" Mobility   ?Outcome Measure ? Help needed turning from your back to your side while in a flat bed without using bedrails?: A Lot ?Help needed moving from lying on your back to sitting on the side of a flat bed without using bedrails?: Total ?Help needed moving to and from a bed to a chair (including a wheelchair)?: Total ?Help needed standing up from a chair using your arms (e.g., wheelchair or bedside chair)?: Total ?Help needed to walk in hospital room?: Total ?Help needed climbing 3-5 steps with a railing? : Total ?6 Click Score: 7 ? ?  ?End of Session   ?Activity Tolerance: Patient tolerated treatment well;Patient limited by fatigue ?Patient left: in bed;with call bell/phone within reach;with bed alarm set;with family/visitor present;with nursing/sitter in room ?Nurse Communication: Mobility status ?PT Visit Diagnosis: Muscle weakness (generalized) (M62.81);Unsteadiness on feet (R26.81);Other abnormalities of gait and mobility (R26.89);Other symptoms and signs involving the nervous system (R29.898) ?  ? ? ?Time: 8101-7510 ?PT Time Calculation (min) (ACUTE ONLY): 29 min ? ?  Charges:  $Therapeutic Activity: 8-22 mins          ?          ? ?Ina Homes, PT, DPT ?Acute Rehabilitation Services  ?Pager [phone] ?Office [phone] ? ?Malachy Chamber ?03/29/2022, 10:17 AM ? ?

## 2022-03-29 NOTE — Progress Notes (Signed)
Inpatient Rehab Admissions Coordinator:  ? ?PT recs for pt to d/c home and return to adult day center.  CIR will sign off.  ? ?Estill Dooms, PT, DPT ?Admissions Coordinator ?[phone] ?03/29/22  ?1:24 PM ? ?

## 2022-03-30 DIAGNOSIS — L089 Local infection of the skin and subcutaneous tissue, unspecified: Secondary | ICD-10-CM | POA: Diagnosis not present

## 2022-03-30 DIAGNOSIS — Z7189 Other specified counseling: Secondary | ICD-10-CM | POA: Diagnosis not present

## 2022-03-30 DIAGNOSIS — Z515 Encounter for palliative care: Secondary | ICD-10-CM | POA: Diagnosis not present

## 2022-03-30 LAB — BASIC METABOLIC PANEL
Anion gap: 7 (ref 5–15)
BUN: 40 mg/dL — ABNORMAL HIGH (ref 8–23)
CO2: 19 mmol/L — ABNORMAL LOW (ref 22–32)
Calcium: 8.8 mg/dL — ABNORMAL LOW (ref 8.9–10.3)
Chloride: 114 mmol/L — ABNORMAL HIGH (ref 98–111)
Creatinine, Ser: 2.15 mg/dL — ABNORMAL HIGH (ref 0.61–1.24)
GFR, Estimated: 34 mL/min — ABNORMAL LOW (ref >60)
Glucose, Bld: 147 mg/dL — ABNORMAL HIGH (ref 70–99)
Potassium: 3.8 mmol/L (ref 3.5–5.1)
Sodium: 140 mmol/L (ref 135–145)

## 2022-03-30 LAB — CBC WITH DIFFERENTIAL/PLATELET
Abs Immature Granulocytes: 0.08 10*3/uL — ABNORMAL HIGH (ref 0.00–0.07)
Basophils Absolute: 0 10*3/uL (ref 0.0–0.1)
Basophils Relative: 0 %
Eosinophils Absolute: 0.3 10*3/uL (ref 0.0–0.5)
Eosinophils Relative: 2 %
HCT: 24.1 % — ABNORMAL LOW (ref 39.0–52.0)
Hemoglobin: 7.7 g/dL — ABNORMAL LOW (ref 13.0–17.0)
Immature Granulocytes: 1 %
Lymphocytes Relative: 18 %
Lymphs Abs: 2.3 10*3/uL (ref 0.7–4.0)
MCH: 31.3 pg (ref 26.0–34.0)
MCHC: 32 g/dL (ref 30.0–36.0)
MCV: 98 fL (ref 80.0–100.0)
Monocytes Absolute: 1.5 10*3/uL — ABNORMAL HIGH (ref 0.1–1.0)
Monocytes Relative: 12 %
Neutro Abs: 8.5 10*3/uL — ABNORMAL HIGH (ref 1.7–7.7)
Neutrophils Relative %: 67 %
Platelets: 197 10*3/uL (ref 150–400)
RBC: 2.46 MIL/uL — ABNORMAL LOW (ref 4.22–5.81)
RDW: 15.6 % — ABNORMAL HIGH (ref 11.5–15.5)
WBC: 12.7 10*3/uL — ABNORMAL HIGH (ref 4.0–10.5)
nRBC: 0 % (ref 0.0–0.2)

## 2022-03-30 LAB — GLUCOSE, CAPILLARY
Glucose-Capillary: 153 mg/dL — ABNORMAL HIGH (ref 70–99)
Glucose-Capillary: 190 mg/dL — ABNORMAL HIGH (ref 70–99)
Glucose-Capillary: 169 mg/dL — ABNORMAL HIGH (ref 70–99)
Glucose-Capillary: 199 mg/dL — ABNORMAL HIGH (ref 70–99)

## 2022-03-30 LAB — MAGNESIUM: Magnesium: 1.9 mg/dL (ref 1.7–2.4)

## 2022-03-30 LAB — PHOSPHORUS: Phosphorus: 3.5 mg/dL (ref 2.5–4.6)

## 2022-03-30 MED ORDER — SODIUM CHLORIDE 0.9 % IV SOLN: INTRAVENOUS | Status: DC

## 2022-03-30 NOTE — Progress Notes (Signed)
?PROGRESS NOTE ? ?Johnathan Keith  ?DOB: [birth year]  ?PCP: Johnathan Greenhouse, MD ?WP:[id number]  ?DOA: 03/20/2022 ? LOS: 10 days  ?Hospital Day: 11 ? ?Brief narrative: ?Johnathan Keith is a 61 y.o. male with PMH significant for DM2, HTN, HLD, CAD/stent, h/o stroke with generalized weakness, vision impairment, wheelchair-bound status, COPD, GERD, polyneuropathy, diabetic foot who lives at home. ?Patient was seen at [facility] and found to have localized abscess and transferred to [facility].   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   ?Overall very poor clinical status. ? ?Subjective: ?Patient was seen and examined this morning.  Middle-aged Caucasian male.  Propped up in bed.  Alert, awake, slow to respond, oriented to place.  ?Wife at bedside.  Patient is inconsistently and intermittently speaking ?I had a long conversation with his wife along with palliative care NP Johnathan Keith at bedside. ?We went through all the details of his labs, imaging, past medical issues, physical and mental status, decline, outcome and disposition plan. ? ?Principal Problem: ?  Right foot infection ?Active Problems: ?  Type 2 diabetes mellitus with diabetic neuropathy, with long-term current use of insulin (Johnathan Keith) ?  Acute kidney injury ([place]) ?  Coronary artery disease with history of myocardial infarction without history of CABG ?  Hypertension ?  Pressure injury of skin ?  ? ?Assessment and Plan: ?Right heel abscess and cellulitis, nonhealing wound:  ?-MRI consistent with calcaneal osteomyelitis. ?-Blood cultures negative. ?-4/19, underwent right BKA, pathology showed clear margins. Treated with broad-spectrum antibiotics for more than 1 week.  Antibiotics stopped. ?  ?Acute metabolic encephalopathy ?-4/20, rapid response was called as patient was less responsive. ?-Patient has poor baseline physical and mental status to begin with.  Mental status worsened probably because of infection and sedation. ?-4/20, CT head did not show any acute  intracranial abnormality.  Ammonia level 26.  Electrolytes normal.  Blood gas without evidence of hypercapnia. ?-On subsequent evaluations, patient patient was alert, awake, slow to respond.  Able to tell me his name and oriented to place.  Able to follow motor commands but is slow.  Per his wife, his mental status is functional with intermittent confusion at home.  Every time he gets hospitalized, he goes through periods of delirium like this.  Continue to monitor mental status change. ? ?Acute on chronic anemia ?-Patient has history of chronic anemia but with hemoglobin more than 10 consistently.  He had EGD and colonoscopy in the past with Dr. Lyndel Keith.  Currently he has no active bleeding but hemoglobin gradually trending down.  7.7 today.  Obtain FOBT.  Obtain anemia panel. ?Recent Labs  ?  03/23/22 ?0955 03/24/22 ?0626 03/28/22 ?1013 03/28/22 ?1505 03/30/22 ?0136  ?HGB 9.4* 10.4* 8.8* 8.6* 7.7*  ?MCV 97.1 97.6 97.9 98.2 98.0  ? ?Acute kidney injury ?-Baseline not available. Creatinine steadily rising up despite IV hydration.  2.15 today.  Continue to monitor.  If continues to worsen in next 24 hours, will get nephrology involvement. ?-Continue to hold torsemide, Aldactone and scheduled potassium. ?-Continue NS at 100 mill per hour. ?Recent Labs  ?  03/20/22 ?2116 03/21/22 ?[id number] 03/22/22 ?[id number] 03/23/22 ?[id number] 03/24/22 ?[id number] 03/27/22 ?[id number] 03/28/22 ?1013 03/30/22 ?0136  ?BUN 29* 30* 29* 28* 27* 35* 39* 40*  ?CREATININE 1.34* 1.03 1.37* 1.39* 1.36* 1.68* 1.92* 2.15*  ? ?Chronic diastolic CHF ?Essential hypertension ?-Echo from 4/17 with EF 55 to 60%, mild LVH, grade 1 diastolic dysfunction ?-PTA, patient was on metoprolol, torsemide, Aldactone.  Continue metoprolol keep others on hold  because of AKI.  Blood pressure stable.  Continue to monitor. ? ?Uncontrolled type 2 diabetes mellitus with hyperglycemia ?Diabetic neuropathy ?-A1c 9.5 on 03/21/2022 ?-Currently on sliding scale insulin with Accu-Cheks.  4/20, Semglee  was discontinued because of lethargy and poor oral intake ?-Blood sugar level remains consistently less than 200.  Switch from dextrose drip to saline drip.  If sugar level rises more than 200 consistently, will resume long-acting insulin. ?Recent Labs  ?Lab 03/29/22 ?1119 03/29/22 ?1809 03/29/22 ?2034 03/30/22 ?0803 03/30/22 ?1128  ?GLUCAP 175* 182* 177* 153* 190*  ? ?History of stroke, generalized debility, right hemiparesis, right sided blindness ?-Patient has above-mentioned chronic neurological deficits and also experiencing gradual loss of vision in the left eye.  Reportedly seen by ophthalmology on 4/6 in the office, showed diabetic proliferative retinopathy and increased intraocular pressure.  ?-Per recommendation by ophthalmologist, echocardiogram and angiogram were obtained to rule out retinal artery stenosis ?-4/17 MR angiogram did not show any large vessel occlusion.  Showed mild to moderate scattered intracranial atherosclerosis. ?-4/17, echocardiogram with EF 55 to 60%, no wall stable malady, G1DD ?-Continue Cosopt eyedrops. ? ?BPH ?-Blood pressure stable on Proscar.  Do a bladder scan ?  ?CAD/MI ?-Continue aspirin, Plavix, Lipitor and metoprolol.   ? ?Poor oral intake ?-Consult dietitian ?  ?Goal of care: ?Very poor baseline status, bedbound.   ?Patient has difficulty coping and recovering from multiple illnesses and surgery. Continue supportive care. ?Overall poor outcome,  ?Per previous hospitalist, multiple previous discussions were held about hospice and DNR.  Patient's wife is a [employer] manager and works at a memory care unit.  She wants him full code and to continue aggressive cares.  Palliative care consult appreciated. ? ?Goals of care ?  Code Status: Full Code  ? ? ?Mobility: Limited mobility.  Seen by PT. ? ?Skin assessment:  ?Pressure Injury 03/20/22 Buttocks Medial;Left;Right Stage 1 -  Intact skin with non-blanchable redness of a localized area usually over a bony prominence. (Active)  ?03/20/22 2113   ?Location: Buttocks  ?Location Orientation: Medial;Left;Right  ?Staging: Stage 1 -  Intact skin with non-blanchable redness of a localized area usually over a bony prominence.  ?Wound Description (Comments):   ?Present on Admission: Yes  ? ? ?Nutritional status:  ?Body mass index is 33.16 kg/m?.  ?  ?  ? ? ? ? ?Diet:  ?Diet Order   ? ?       ?  Diet Carb Modified Fluid consistency: Thin; Room service appropriate? Yes  Diet effective now       ?  ? ?  ?  ? ?  ? ? ?DVT prophylaxis:  ?SCD's Start: 03/27/22 2033 ?enoxaparin (LOVENOX) injection 40 mg Start: 03/21/22 2200 ?  ?Antimicrobials: Completed course of antibiotics ?Fluid: D5 NS at 75 mill per hour ?Consultants: Palliative care ?Family Communication: None at bedside ? ?Status is: Inpatient ? ?Continue in-hospital care because: Pending clinical course, pending PT eval ?Level of care: Med-Surg  ? ?Dispo: The patient is from: Home ?             Anticipated d/c is to: Pending PT eval ?             Patient currently is not medically stable to d/c. ?  Difficult to place patient No ? ? ? ? ?Infusions:  ? sodium chloride 100 mL/hr at 03/30/22 1008  ? magnesium sulfate bolus IVPB    ? ? ?Scheduled Meds: ? allopurinol  100 mg Oral Daily  ? vitamin C  1,000 mg Oral Daily  ? aspirin EC  81 mg Oral Daily  ? atorvastatin  80 mg Oral Daily  ? bethanechol  10 mg Oral TID  ? budesonide  0.25 mg Nebulization BID  ? carbidopa-levodopa  0.5 tablet Oral BID  ? cholecalciferol  1,000 Units Oral Q1200  ? clopidogrel  75 mg Oral Daily  ? docusate sodium  100 mg Oral Daily  ? dorzolamide-timolol  1 drop Both Eyes BID  ? enoxaparin (LOVENOX) injection  40 mg Subcutaneous QHS  ? ferrous sulfate  325 mg Oral Daily  ? finasteride  5 mg Oral Daily  ? gabapentin  100 mg Oral BID  ? insulin aspart  0-15 Units Subcutaneous TID WC  ? insulin aspart  0-5 Units Subcutaneous QHS  ? levothyroxine  25 mcg Oral QAC breakfast  ? lipase/protease/amylase  36,000 Units Oral BID WC  ? mirtazapine  15 mg  Oral QHS  ? montelukast  10 mg Oral QHS  ? multivitamin  1 tablet Oral Daily  ? nutrition supplement (JUVEN)  1 packet Oral BID BM  ? pantoprazole  40 mg Oral Daily  ? tamsulosin  0.4 mg Oral QPC supper

## 2022-03-30 NOTE — Progress Notes (Signed)
? ?Palliative Medicine Inpatient Follow Up Note ? ?HPI: ?61 year old gentleman with severe, multiple underlying comorbidities with history of coronary artery disease status post PCI now on dual antiplatelet therapy, history of stroke with generalized weakness right more than left, wheelchair-bound, hypertension, COPD, GERD, type 2 diabetes on insulin and diabetic polyneuropathy who was suffering from right heel nonhealing wound for about a year, seen at [facility] and found to have localized abscess and sent to [facility].   he was admitted to [name] long for last 4 days, recommended transfer to [facility] for surgical intervention. ?Also complains of gradual vision loss in the left eye.  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  Overall very poor clinical status. ?  ?Palliative care was asked to get involved to further address goals of care in the setting of multiple chronic disease processes. S/P  right transtibial amputation on this admission with poor baseline level of functioning.  ? ?Today's Discussion (03/30/2022): ? ?*Please note that this is a verbal dictation therefore any spelling or grammatical errors are due to the "[place] [employer]" system interpretation. ? ?Chart reviewed inclusive of vital signs, progress notes, laboratory results, and diagnostic images. Patients Cr/BUN continue to increase. ? ?Dr. Pietro Cassis and I met with Johnathan Keith and his spouse, Johnathan Keith this morning. Dr. Pietro Cassis was able to review all of patient labs and identify a tx plan in terms of anemia, AKI, and immobility. He shares that Johnathan Keith has many factors working against him though despite this it will be his great effort to improve his present situation. Reviewed the importance of acute rehabilitation to give [place] the best chance of recovery which he and his wife were receptive towards.  ? ?Reviewed that my present yesterday was very upsetting to the patient and caused him to be tearful throughout the  course of the day. I shared that moving forward we can modify the climate of these meetings.  ? ?Created space and opportunity for patient to explore thoughts feelings and fears regarding current medical situation. ? ?Questions and concerns addressed  ? ?Palliative Support Provided ? ?Objective Assessment: ?Vital Signs ?Vitals:  ? 03/29/22 2032 03/30/22 0429  ?BP: (!) 104/57 124/66  ?Pulse: 87 70  ?Resp: 15 15  ?Temp: (!) 97.5 ?F (36.4 ?C) 98.8 ?F (37.1 ?C)  ?SpO2: 96% 100%  ? ? ?Intake/Output Summary (Last 24 hours) at 03/30/2022 0650 ?Last data filed at 03/30/2022 0225 ?Gross per 24 hour  ?Intake --  ?Output 1600 ml  ?Net -1600 ml  ? ?Last Weight  Most recent update: 03/21/2022  2:03 AM  ? ? Weight  ?93.2 kg (205 lb 7.5 oz)  ?      ? ?  ? ?Gen: Older Caucasian male in no acute distress ?HEENT: moist mucous membranes ?CV: Regular rate and rhythm ?PULM: On 2L nasal cannula breathing is even and nonlabored ?ABD: soft/nontender ?EXT: Right lower extremity dressing in place ?Neuro: Alert - slow to respond ? ?SUMMARY OF RECOMMENDATIONS   ?Full Code/ Full Scope of care presently ?  ?A copy of advance directives were provided for review and completion ?  ?Outpatient palliative care support upon discharge --> Have asked transitions of care team if this could be provided by pace program which they have confirmed can be done ?  ?Goals are for improvement and recovery --> Patient and his spouse more open to CIR today ?  ?Ongoing incremental palliative care support on this hospital stay ? ?MDM - High ?______________________________________________________________________________________ ?  Tacey Ruiz ?[place] Team ?Team Cell Phone: [phone] ?Please utilize secure chat with additional questions, if there is no response within 30 minutes please call the above phone number ? ?Palliative Medicine Team providers are available by phone from 7am to 7pm daily and can be reached through the team cell  phone.  ?Should this patient require assistance outside of these hours, please call the patient's attending physician. ? ? ? ? ?

## 2022-03-30 NOTE — Plan of Care (Signed)

## 2022-03-31 DIAGNOSIS — D509 Iron deficiency anemia, unspecified: Secondary | ICD-10-CM

## 2022-03-31 DIAGNOSIS — L089 Local infection of the skin and subcutaneous tissue, unspecified: Secondary | ICD-10-CM | POA: Diagnosis not present

## 2022-03-31 DIAGNOSIS — Z515 Encounter for palliative care: Secondary | ICD-10-CM | POA: Diagnosis not present

## 2022-03-31 LAB — VITAMIN B12: Vitamin B-12: 341 pg/mL (ref 180–914)

## 2022-03-31 LAB — RETICULOCYTES
Immature Retic Fract: 24.2 % — ABNORMAL HIGH (ref 2.3–15.9)
RBC.: 2.18 MIL/uL — ABNORMAL LOW (ref 4.22–5.81)
Retic Count, Absolute: 59.1 10*3/uL (ref 19.0–186.0)
Retic Ct Pct: 2.7 % (ref 0.4–3.1)

## 2022-03-31 LAB — BASIC METABOLIC PANEL
Anion gap: 7 (ref 5–15)
BUN: 41 mg/dL — ABNORMAL HIGH (ref 8–23)
CO2: 18 mmol/L — ABNORMAL LOW (ref 22–32)
Calcium: 8.6 mg/dL — ABNORMAL LOW (ref 8.9–10.3)
Chloride: 114 mmol/L — ABNORMAL HIGH (ref 98–111)
Creatinine, Ser: 2.05 mg/dL — ABNORMAL HIGH (ref 0.61–1.24)
GFR, Estimated: 36 mL/min — ABNORMAL LOW (ref >60)
Glucose, Bld: 213 mg/dL — ABNORMAL HIGH (ref 70–99)
Potassium: 3.8 mmol/L (ref 3.5–5.1)
Sodium: 139 mmol/L (ref 135–145)

## 2022-03-31 LAB — IRON AND TIBC
Iron: 24 ug/dL — ABNORMAL LOW (ref 45–182)
Saturation Ratios: 13 % — ABNORMAL LOW (ref 17.9–39.5)
TIBC: 185 ug/dL — ABNORMAL LOW (ref 250–450)
UIBC: 161 ug/dL

## 2022-03-31 LAB — CBC WITH DIFFERENTIAL/PLATELET
Abs Immature Granulocytes: 0.08 10*3/uL — ABNORMAL HIGH (ref 0.00–0.07)
Basophils Absolute: 0 10*3/uL (ref 0.0–0.1)
Basophils Relative: 0 %
Eosinophils Absolute: 0.2 10*3/uL (ref 0.0–0.5)
Eosinophils Relative: 2 %
HCT: 21.8 % — ABNORMAL LOW (ref 39.0–52.0)
Hemoglobin: 6.6 g/dL — CL (ref 13.0–17.0)
Immature Granulocytes: 1 %
Lymphocytes Relative: 20 %
Lymphs Abs: 2.1 10*3/uL (ref 0.7–4.0)
MCH: 30.1 pg (ref 26.0–34.0)
MCHC: 30.3 g/dL (ref 30.0–36.0)
MCV: 99.5 fL (ref 80.0–100.0)
Monocytes Absolute: 1.1 10*3/uL — ABNORMAL HIGH (ref 0.1–1.0)
Monocytes Relative: 10 %
Neutro Abs: 7.3 10*3/uL (ref 1.7–7.7)
Neutrophils Relative %: 67 %
Platelets: 228 10*3/uL (ref 150–400)
RBC: 2.19 MIL/uL — ABNORMAL LOW (ref 4.22–5.81)
RDW: 15.7 % — ABNORMAL HIGH (ref 11.5–15.5)
WBC: 10.8 10*3/uL — ABNORMAL HIGH (ref 4.0–10.5)
nRBC: 0 % (ref 0.0–0.2)

## 2022-03-31 LAB — GLUCOSE, CAPILLARY
Glucose-Capillary: 177 mg/dL — ABNORMAL HIGH (ref 70–99)
Glucose-Capillary: 229 mg/dL — ABNORMAL HIGH (ref 70–99)
Glucose-Capillary: 264 mg/dL — ABNORMAL HIGH (ref 70–99)
Glucose-Capillary: 272 mg/dL — ABNORMAL HIGH (ref 70–99)

## 2022-03-31 LAB — FOLATE: Folate: 26.2 ng/mL (ref >5.9)

## 2022-03-31 LAB — PREPARE RBC (CROSSMATCH)

## 2022-03-31 LAB — FERRITIN: Ferritin: 128 ng/mL (ref 24–336)

## 2022-03-31 LAB — PHOSPHORUS: Phosphorus: 3.4 mg/dL (ref 2.5–4.6)

## 2022-03-31 LAB — MAGNESIUM: Magnesium: 2 mg/dL (ref 1.7–2.4)

## 2022-03-31 MED ORDER — PANTOPRAZOLE SODIUM 40 MG PO TBEC
40.0000 mg | DELAYED_RELEASE_TABLET | Freq: Two times a day (BID) | ORAL | Status: DC
  Administered 2022-03-31 – 2022-04-02 (×4): 40 mg via ORAL
  Filled 2022-03-31 (×4): qty 1

## 2022-03-31 MED ORDER — SENNOSIDES-DOCUSATE SODIUM 8.6-50 MG PO TABS
1.0000 | ORAL_TABLET | Freq: Every day | ORAL | Status: DC
  Administered 2022-03-31 – 2022-04-01 (×2): 1 via ORAL
  Filled 2022-03-31 (×2): qty 1

## 2022-03-31 MED ORDER — SODIUM CHLORIDE 0.9% IV SOLUTION: Freq: Once | INTRAVENOUS | Status: AC

## 2022-03-31 MED ORDER — POLYETHYLENE GLYCOL 3350 17 G PO PACK
17.0000 g | PACK | Freq: Every day | ORAL | Status: DC | PRN
  Administered 2022-04-02: 17 g via ORAL
  Filled 2022-03-31: qty 1

## 2022-03-31 MED ORDER — POLYETHYLENE GLYCOL 3350 17 G PO PACK
17.0000 g | PACK | Freq: Every day | ORAL | Status: AC
  Administered 2022-03-31 – 2022-04-01 (×2): 17 g via ORAL
  Filled 2022-03-31 (×2): qty 1

## 2022-03-31 NOTE — Progress Notes (Signed)
? ?Palliative Medicine Inpatient Follow Up Note ? ?HPI: ?61 year old gentleman with severe, multiple underlying comorbidities with history of coronary artery disease status post PCI now on dual antiplatelet therapy, history of stroke with generalized weakness right more than left, wheelchair-bound, hypertension, COPD, GERD, type 2 diabetes on insulin and diabetic polyneuropathy who was suffering from right heel nonhealing wound for about a year, seen at [facility] and found to have localized abscess and sent to [facility].   he was admitted to [facility] long for last 4 days, recommended transfer to [facility] for surgical intervention. ?Also complains of gradual vision loss in the left eye.  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  Overall very poor clinical status. ?  ?Palliative care was asked to get involved to further address goals of care in the setting of multiple chronic disease processes. S/P  right transtibial amputation on this admission with poor baseline level of functioning.  ? ?Today's Discussion (03/31/2022): ? ?*Please note that this is a verbal dictation therefore any spelling or grammatical errors are due to the "[employer]" system interpretation. ? ?Chart reviewed inclusive of vital signs, progress notes, laboratory results, and diagnostic images. Patients Hgb/Hct decreased overnight. ? ?I met with Rorey and his spouse, Manuela Schwartz at bedside. Manuela Schwartz shares concern over patients decreased Hgb/Hct. We reviewed the plan for a blood transfusion and that the primary medical team would provide additional updates.  ? ?Manuela Schwartz expresses that Tia has still not been out of the bed. Per his RN, this morning we were able to get a transfer board and get him into the recliner. He required 4P assistance for this. I shared with Manuela Schwartz my hope that [employer] can get approved for CIR as I truly feel that will be his best chance for recovery.  ? ?I spoke to Manuela Schwartz about a variety of  concerns which were present in terms of promptness of services seeing her. Reviewed that there are some elements we are not able to control in this setting. Advocated for ongoing support.  ? ?Questions and concerns addressed  ? ?Palliative Support Provided ? ?Objective Assessment: ?Vital Signs ?Vitals:  ? 03/31/22 0823 03/31/22 0900  ?BP: (!) 106/44   ?Pulse: 87   ?Resp: 16   ?Temp: 98.1 ?F (36.7 ?C)   ?SpO2: 100% 99%  ? ? ?Intake/Output Summary (Last 24 hours) at 03/31/2022 1140 ?Last data filed at 03/31/2022 0900 ?Gross per 24 hour  ?Intake --  ?Output 900 ml  ?Net -900 ml  ? ? ?Last Weight  Most recent update: 03/21/2022  2:03 AM  ? ? Weight  ?93.2 kg (205 lb 7.5 oz)  ?      ? ?  ? ?Gen: Older Caucasian male in no acute distress ?HEENT: moist mucous membranes ?CV: Regular rate and rhythm ?PULM: On 2L nasal cannula breathing is even and nonlabored ?ABD: soft/nontender ?EXT: Right lower extremity dressing in place ?Neuro: Alert - slow to respond ? ?SUMMARY OF RECOMMENDATIONS   ?Full Code/ Full Scope of care presently ?  ?A copy of advance directives were provided for review and completion ?  ?Outpatient palliative care support upon discharge --> Have asked transitions of care team if this could be provided by pace program which they have confirmed can be done ?  ?Goals are for improvement and recovery --> CIR ideally if patients insurance authorizes ?  ?Ongoing incremental palliative care support on this hospital stay ? ?MDM - High ?______________________________________________________________________________________ ?Tacey Ruiz ?[facility] Palliative Medicine Team ?Team Cell Phone: [phone] ?Please utilize secure chat with additional questions, if there is no response within 30 minutes please call the above phone number ? ?Palliative Medicine Team providers are available by phone from 7am to 7pm daily and can be reached through the team cell phone.  ?Should this patient require assistance outside of  these hours, please call the patient's attending physician. ? ? ? ? ?

## 2022-03-31 NOTE — Plan of Care (Signed)

## 2022-03-31 NOTE — Progress Notes (Signed)
?PROGRESS NOTE ? ?Johnathan Keith  ?DOB: 03-25-1961  ?PCP: Algis Greenhouse, MD ?GQ:[id number]  ?DOA: 03/20/2022 ? LOS: 11 days  ?Hospital Day: 12 ? ?Brief narrative: ?Johnathan Keith is a 61 y.o. male with PMH significant for DM2, HTN, HLD, CAD/stent, h/o stroke with generalized weakness, vision impairment, wheelchair-bound status, COPD, GERD, polyneuropathy, diabetic foot who lives at home. ?Patient was seen at [facility] and found to have localized abscess and transferred to [facility].   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   ?Overall very poor clinical status. ? ?Subjective: ?Patient was seen and examined this morning.   ?Pleasant middle-aged Caucasian male.  Looks older for his age.  Sitting up in chair.  Awake, alert, weak, slow to talk.   ?Wife at bedside.   ?Patient feels happy to be out of bed.   ?No bowel movement last 3 days.   ?Hemoglobin down to 6.6 today.  Agreeable to blood transfusion. ? ?Principal Problem: ?  Right foot infection ?Active Problems: ?  Type 2 diabetes mellitus with diabetic neuropathy, with long-term current use of insulin ([place]) ?  Acute kidney injury ([place]) ?  Coronary artery disease with history of myocardial infarction without history of CABG ?  Hypertension ?  Pressure injury of skin ?  ? ?Assessment and Plan: ?Acute on chronic anemia ?-Patient has history of chronic anemia but with baseline hemoglobin more than 10 consistently.  He had EGD and colonoscopy in the past with Dr. Lyndel Safe.  Patient reports he had polyps in the stomach.  He has a family history of colon cancer as well.  Currently he has no active bleeding but hemoglobin gradually trending down.  6.6 today.  Obtain FOBT.  GI consult. ?-Patient is on aspirin, Plavix for history of stroke and Lovenox subcu for DVT prophylaxis. ?-Hold Plavix and Lovenox.  SCDs for DVT prophylaxis. ?Recent Labs  ?  03/24/22 ?0626 03/28/22 ?1013 03/28/22 ?1505 03/30/22 ?0136 03/31/22 ?0120  ?HGB 10.4* 8.8* 8.6* 7.7* 6.6*  ?MCV 97.6 97.9  98.2 98.0 99.5  ?[id number]  --   --   --   --  341  ?FOLATE  --   --   --   --  26.2  ?FERRITIN  --   --   --   --  128  ?TIBC  --   --   --   --  185*  ?IRON  --   --   --   --  24*  ?RETICCTPCT  --   --   --   --  2.7  ? ?Acute kidney injury ?-Baseline not available.  Creatinine steadily increased to peak at 2.15 yesterday.  Improving with IV fluid, 2.05 today. ?-Continue to hold torsemide, Aldactone and scheduled potassium. ?-Continue NS at 100 mill per hour. ?Recent Labs  ?  03/20/22 ?2116 03/21/22 ?[id number] 03/22/22 ?[id number] 03/23/22 ?[id number] 03/24/22 ?[id number] 03/27/22 ?[id number] 03/28/22 ?1013 03/30/22 ?0136 03/31/22 ?0120  ?BUN 29* 30* 29* 28* 27* 35* 39* 40* 41*  ?CREATININE 1.34* 1.03 1.37* 1.39* 1.36* 1.68* 1.92* 2.15* 2.05*  ? ?Right heel abscess and cellulitis, nonhealing wound:  ?-MRI consistent with calcaneal osteomyelitis. ?-Blood cultures negative. ?-4/19, underwent right BKA, pathology showed clear margins. Treated with broad-spectrum antibiotics for more than 1 week.  Antibiotics stopped. ?  ?Acute metabolic encephalopathy ?-4/20, rapid response was called as patient was less responsive. ?-Patient has poor baseline physical and mental status to begin with.  Mental status worsened probably because of infection and sedation. ?-4/20, CT head did not  show any acute intracranial abnormality.  Ammonia level 26.  Electrolytes normal.  Blood gas without evidence of hypercapnia. ?-On subsequent evaluations, patient patient was alert, awake, slow to respond.  Able to tell me his name and oriented to place.  Able to follow motor commands but is slow.  Per his wife, his mental status is functional with intermittent confusion at home.  Every time he gets hospitalized, he goes through periods of delirium like this.  Continue to monitor mental status change. ? ?Chronic diastolic CHF ?Essential hypertension ?-Echo from 4/17 with EF 55 to 60%, mild LVH, grade 1 diastolic dysfunction ?-PTA, patient was on metoprolol, torsemide,  Aldactone.  Continue metoprolol keep others on hold because of AKI.  Blood pressure stable.  Continue to monitor. ? ?Uncontrolled type 2 diabetes mellitus with hyperglycemia ?Diabetic neuropathy ?-A1c 9.5 on 03/21/2022 ?-Currently on sliding scale insulin with Accu-Cheks.  4/20, Semglee was discontinued because of lethargy and poor oral intake. ?-Blood sugar level gradually trending up but still less than 200.  Patient wants regular diet.  If sugar level rises more than 200 consistently, will resume long-acting insulin. ?Recent Labs  ?Lab 03/30/22 ?0803 03/30/22 ?1128 03/30/22 ?1558 03/30/22 ?2031 03/31/22 ?[id number]  ?GLUCAP 153* 190* 169* 199* 177*  ? ?History of stroke, generalized debility, right hemiparesis, right sided blindness ?-Patient has above-mentioned chronic neurological deficits and also experiencing gradual loss of vision in the left eye.  Reportedly seen by ophthalmology on 4/6 in the office, showed diabetic proliferative retinopathy and increased intraocular pressure.  ?-Per recommendation by ophthalmologist, echocardiogram and angiogram were obtained to rule out retinal artery stenosis ?-4/17 MR angiogram did not show any large vessel occlusion.  Showed mild to moderate scattered intracranial atherosclerosis. ?-4/17, echocardiogram with EF 55 to 60%, no wall motion abnormality , G1DD ?-Continue Cosopt eyedrops. ? ?BPH ?-Blood pressure stable on Proscar.  ?  ?CAD/MI ?-Continue aspirin, Plavix, Lipitor and metoprolol.   ? ?Poor oral intake ?-Dietitian consulted ?  ?Goal of care: ?Very poor baseline status, bedbound.   ?Patient has difficulty coping and recovering from multiple illnesses and surgery. Continue supportive care. ?Overall poor outcome,  ?Per previous hospitalist, multiple previous discussions were held about hospice and DNR.  Patient's wife is a [employer] manager and works at a memory care unit.  She wants him full code and to continue aggressive cares.  Palliative care consult appreciated. ? ?Goals of  care ?  Code Status: Full Code  ? ? ?Mobility: Limited mobility.  Seen by PT. ? ?Skin assessment:  ?Pressure Injury 03/20/22 Buttocks Medial;Left;Right Stage 1 -  Intact skin with non-blanchable redness of a localized area usually over a bony prominence. (Active)  ?03/20/22 2113  ?Location: Buttocks  ?Location Orientation: Medial;Left;Right  ?Staging: Stage 1 -  Intact skin with non-blanchable redness of a localized area usually over a bony prominence.  ?Wound Description (Comments):   ?Present on Admission: Yes  ? ? ?Nutritional status:  ?Body mass index is 33.16 kg/m?.  ?  ?  ? ? ? ? ?Diet:  ?Diet Order   ? ?       ?  Diet regular Room service appropriate? Yes; Fluid consistency: Thin  Diet effective now       ?  ? ?  ?  ? ?  ? ? ?DVT prophylaxis:  ?SCD's Start: 03/27/22 2033 ?  ?Antimicrobials: Completed course of antibiotics ?Fluid: D5 NS at 100 mill per hour ?Consultants: Palliative care, GI ?Family Communication: None at bedside ? ?Status is: Inpatient ? ?  Continue in-hospital care because: Pending clinical course, PT eval appreciated. ?Level of care: Med-Surg  ? ?Dispo: The patient is from: Home ?             Anticipated d/c is to: Pending PT eval ?             Patient currently is not medically stable to d/c. ?  Difficult to place patient No ? ? ? ? ?Infusions:  ? sodium chloride 100 mL/hr at 03/30/22 2316  ? magnesium sulfate bolus IVPB    ? ? ?Scheduled Meds: ? sodium chloride   Intravenous Once  ? allopurinol  100 mg Oral Daily  ? vitamin C  1,000 mg Oral Daily  ? aspirin EC  81 mg Oral Daily  ? atorvastatin  80 mg Oral Daily  ? bethanechol  10 mg Oral TID  ? budesonide  0.25 mg Nebulization BID  ? carbidopa-levodopa  0.5 tablet Oral BID  ? cholecalciferol  1,000 Units Oral Q1200  ? dorzolamide-timolol  1 drop Both Eyes BID  ? ferrous sulfate  325 mg Oral Daily  ? finasteride  5 mg Oral Daily  ? gabapentin  100 mg Oral BID  ? insulin aspart  0-15 Units Subcutaneous TID WC  ? insulin aspart  0-5 Units  Subcutaneous QHS  ? levothyroxine  25 mcg Oral QAC breakfast  ? lipase/protease/amylase  36,000 Units Oral BID WC  ? mirtazapine  15 mg Oral QHS  ? montelukast  10 mg Oral QHS  ? multivitamin  1 tablet Oral Daily

## 2022-03-31 NOTE — Consult Note (Addendum)
? ? ?Consultation ? ?Referring Provider: No ref. provider found ?Primary Care Physician:  Algis Greenhouse, MD ?Primary Gastroenterologist:  Dr.Gupta ? ?Reason for Consultation:  anemia -drifting HGB ? ?HPI: Johnathan Keith is a 61 y.o. male, who was admitted 10 days ago, with right heel pain, and odor, found to have an abscess involving the right calcaneus.  He was treated with IV antibiotics, and then seen by orthopedic medial posterior heel with underlying osteomyelitis and adjacent soft tissue edema without defined abscess s, and underwent MRI on 03/25/2022 which showed a soft tissue ulcer with associated osteomyelitis. ? ?He was taken to the OR on 03/27/2022, and ultimately required a right BKA, and has wound VAC in place. ?Patient has multiple comorbidities, prior CVA with residual right-sided weakness, wheelchair-bound, diabetes mellitus, congestive heart failure, coronary artery disease status post prior PCI.  He has been on dual antiplatelet therapy.  He has had a chronic pain syndrome. ?He has a chronic anemia with baseline hemoglobin around 10, he does stay on ferrous sulfate at home once daily. ?On 03/23/2022 hemoglobin was 9.3 hematocrit 30, postoperatively hemoglobin 8.8 ?On 03/30/2022 hemoglobin 7.7/hematocrit 24.1/WBC 12.7/platelets 197 ?BUN 40/creatinine 2.1 ? ?Today hemoglobin 6.6/hematocrit 21.8/WBC 10.8 ?Ferritin 128/serum iron 24/TIBC 185/sat 13 ? ?He had been on aspirin Plavix and Lovenox, Plavix held today as well as Lovenox. ? ?Does not have prior history of GI bleeding.  He had undergone ERCP in 2020 for common bile duct stone, and is known to Dr. Lyndel Safe from prior colonoscopy done in 2018 for chronic diarrhea.  He had a 6 mm polyp removed at that time and random biopsies. ?Path on the polyp was consistent with a tubular adenoma, and random biopsies were negative for microscopic colitis.  He had EGD in 2020 inpatient for complaints of dysphagia.  No definite stricture noted but he had empiric  esophageal dilation and was also noted to have multiple gastric polyps.  Biopsy was done and path showed ?Fundic gland polyps, no H. Pylori ? ?He also had cholecystectomy in 2020. ? ?He says he had had ongoing problems with diarrhea over the past couple of years.  Interestingly since hospitalization he has not had a bowel movement in the past 5 to 6 days. ?Patient and his wife are unaware of any melena or hematochezia with his last bowel movement. ?Patient is pleasantly a bit confused but has no complaints of nausea, or vomiting no dysphagia or odynophagia.  He says he has some abdominal discomfort but that has been present for a long time with no recent changes and is not well localized. ? ? ?Past Medical History:  ?Diagnosis Date  ? Abnormal nuclear cardiac imaging test   ? High-risk nuclear stress test   ? Adjustment disorder with mixed anxiety and depressed mood 07/27/2020  ? AKI (acute kidney injury) ([place]) 03/26/2020  ? Arthritis   ? Bicuspid aortic valve 03/27/2020  ? Formatting of this note might be different from the original. 2020: ECHO  ? Bilateral low back pain without sciatica 09/13/2015  ? Bilateral lower extremity edema 08/18/2018  ? Bilateral lower extremity edema  Formatting of this note might be different from the original. Bilateral lower extremity edema  Last Assessment & Plan:  He does have 1-2+ pitting edema.  I am going to increase his Lasix to 80 mg a day, and will check a basic metabolic panel in 7 to 10 days.  He will see Cyril Mourning back in 1 month for follow-up of lab work.  ? BPH  with obstruction/lower urinary tract symptoms 02/20/2016  ? Bradycardia 04/07/2020  ? CAD -S/P PCI-2016 11/30/2015  ? LAD DES 2016. Admitted to [facility] Sept 2017 with SOB, not felt to be in CHF, Myoview was negative for sichemia  Formatting of this note might be different from the original. Overview:  LAD DES 2016. Admitted to [facility] Sept 2017 with SOB, not felt to be in CHF, Myoview was negative for sichemia  Last Assessment  & Plan:  History of CAD status post LAD stenting by myself in 2016 with re-intervention because o  ? Carotid stenosis 04/07/2013  ? Rt CEA Oct 2015-Dr Chen  Last Assessment & Plan:  History of carotid artery disease status post right carotid endarterectomy by Dr. Bridgett Larsson October 2015 which he follows by duplex ultrasound. Formatting of this note might be different from the original. Rt CEA Oct 2015-Dr Bridgett Larsson  Last Assessment & Plan:  History of carotid artery disease status post right carotid endarterectomy by Dr. Bridgett Larsson October 2015  ? Carpal tunnel syndrome 07/17/2015  ? 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[place]) 09/13/2015  ? Cerebrovascular accident (CVA) due to thrombosis of right middle cerebral artery ([place]) 07/04/2014  ? Questionable small stroke in 2014 with transient Lt sided weakness in setting of carotid disease.  Formatting of this note might be different from the original. Overview:  Questionable small stroke in 2014 with transient Lt sided weakness in setting of carotid disease.  Last Assessment & Plan:  History of multiple strokes in the past dating back to 2014 with a recent stroke in April of this year a  ? Cervical spondylosis without myelopathy 01/31/2019  ? Chronic diarrhea 12/09/2018  ? Chronic hypoxemic respiratory failure ([place]) 06/19/2020  ? Chronic pain disorder 01/31/2019  ? Coronary artery disease   ? drug-eluting stents placed in proximal and mid LAD  ? 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 Deviated septum   ? Diabetic retinopathy ([place])   ? Dyslipidemia 12/09/2018  ? Dyspnea on exertion 03/26/2020  ? Dyspnea on exertion  Formatting of this note might be different from the original. Overview:  Dyspnea on exertion  Last Assessment & Plan:  Improved after stenting of his LAD  ? Erectile dysfunction 11/25/2015  ? Essential hypertension 04/07/2013  ? hypertension  Formatting of this note might be different from the original. hypertension  Last  Assessment & Plan:  History of essential hypertension her blood pressure measured at 162/70.  He is on benazepril 10 the morning and 20 in the evening as well as hydralazine, hydrochlorothiazide, and metoprolol.  Blood pressures have been on the high side.  I am going to increase his benazepril to 20 mg   ? GERD (gastroesophageal reflux disease)   ? "occasionally" (03/23/2015)  ? Heart attack ([place])   ? "in 1997 was told I had signs of a small heart attack that I didn't know I'd had"  ? History of gout   ? "when I was younger"  ? History of kidney stones   ? passed  ? Hypogonadism male 01/11/2016  ? Large liver 12/09/2018  ? Memory loss 03/15/2020  ? Migraine   ? "had my 1st and only in 12/2014" (03/23/2015)  ? Occipital neuralgia of right side 12/18/2016  ? Peripheral neuropathy   ? feet  ? Peripheral vascular disease ([place])   ? carotid artery disease  ? PONV (postoperative nausea and vomiting) 2015  ? "was told I was confused and combative in recovery from  the narcotics w/my carotid OR"  "During a colonoscopy my oxygen level dropped so low that I had to be woke up.- 03/2017 colonoscopy [facility]   ? Sleep apnea   ? "severe; couldn't afford mask" (03/23/2015) uses O2 at night.no C-pap  ? SOB (shortness of breath) 05/04/2019  ? 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 Stroke ([place]) 05/10/2019  ? Aphasia mild  ? Transaminitis 05/08/2019  ? Type 2 diabetes mellitus with diabetic neuropathy ([place]) 12/09/2018  ? ? ?Past Surgical History:  ?Procedure Laterality Date  ? AMPUTATION Right 03/27/2022  ? Procedure: RIGHT BELOW KNEE AMPUTATION;  Surgeon: Newt Minion, MD;  Location: [place];  Service: Orthopedics;  Laterality: Right;  ? APPLICATION OF WOUND VAC Right 03/27/2022  ? Procedure: APPLICATION OF WOUND VAC;  Surgeon: Newt Minion, MD;  Location: [place];  Service: Orthopedics;  Laterality: Right;  ? BILIARY DILATION  08/11/2019  ? Procedure: BILIARY DILATION;  Surgeon: Rush Landmark Telford Nab., MD;  Location: [place];  Service: Gastroenterology;;  ? BIOPSY  08/11/2019  ? Procedure: BIOPSY;  Surgeon: Irving Copas., MD;  Location: [place];  Service: Gastroenterology;;  ? CHOLECYSTECTOMY N/A 08/31/2019  ? Procedur

## 2022-04-01 ENCOUNTER — Telehealth: Payer: Self-pay | Admitting: Orthopedic Surgery

## 2022-04-01 DIAGNOSIS — D649 Anemia, unspecified: Secondary | ICD-10-CM

## 2022-04-01 DIAGNOSIS — L089 Local infection of the skin and subcutaneous tissue, unspecified: Secondary | ICD-10-CM | POA: Diagnosis not present

## 2022-04-01 DIAGNOSIS — Z7902 Long term (current) use of antithrombotics/antiplatelets: Secondary | ICD-10-CM

## 2022-04-01 LAB — BPAM RBC
Blood Product Expiration Date: 202305122359
ISSUE DATE / TIME: 202304231458
Unit Type and Rh: 6200

## 2022-04-01 LAB — CBC WITH DIFFERENTIAL/PLATELET
Abs Immature Granulocytes: 0.07 10*3/uL (ref 0.00–0.07)
Basophils Absolute: 0 10*3/uL (ref 0.0–0.1)
Basophils Relative: 0 %
Eosinophils Absolute: 0.2 10*3/uL (ref 0.0–0.5)
Eosinophils Relative: 2 %
HCT: 27.7 % — ABNORMAL LOW (ref 39.0–52.0)
Hemoglobin: 8.9 g/dL — ABNORMAL LOW (ref 13.0–17.0)
Immature Granulocytes: 1 %
Lymphocytes Relative: 12 %
Lymphs Abs: 1.3 10*3/uL (ref 0.7–4.0)
MCH: 30.5 pg (ref 26.0–34.0)
MCHC: 32.1 g/dL (ref 30.0–36.0)
MCV: 94.9 fL (ref 80.0–100.0)
Monocytes Absolute: 0.8 10*3/uL (ref 0.1–1.0)
Monocytes Relative: 8 %
Neutro Abs: 8.4 10*3/uL — ABNORMAL HIGH (ref 1.7–7.7)
Neutrophils Relative %: 77 %
Platelets: 252 10*3/uL (ref 150–400)
RBC: 2.92 MIL/uL — ABNORMAL LOW (ref 4.22–5.81)
RDW: 16 % — ABNORMAL HIGH (ref 11.5–15.5)
WBC: 10.8 10*3/uL — ABNORMAL HIGH (ref 4.0–10.5)
nRBC: 0 % (ref 0.0–0.2)

## 2022-04-01 LAB — GLUCOSE, CAPILLARY
Glucose-Capillary: 282 mg/dL — ABNORMAL HIGH (ref 70–99)
Glucose-Capillary: 340 mg/dL — ABNORMAL HIGH (ref 70–99)
Glucose-Capillary: 297 mg/dL — ABNORMAL HIGH (ref 70–99)
Glucose-Capillary: 284 mg/dL — ABNORMAL HIGH (ref 70–99)

## 2022-04-01 LAB — BASIC METABOLIC PANEL
Anion gap: 5 (ref 5–15)
BUN: 50 mg/dL — ABNORMAL HIGH (ref 8–23)
CO2: 20 mmol/L — ABNORMAL LOW (ref 22–32)
Calcium: 9.1 mg/dL (ref 8.9–10.3)
Chloride: 114 mmol/L — ABNORMAL HIGH (ref 98–111)
Creatinine, Ser: 1.85 mg/dL — ABNORMAL HIGH (ref 0.61–1.24)
GFR, Estimated: 41 mL/min — ABNORMAL LOW (ref >60)
Glucose, Bld: 311 mg/dL — ABNORMAL HIGH (ref 70–99)
Potassium: 4.3 mmol/L (ref 3.5–5.1)
Sodium: 139 mmol/L (ref 135–145)

## 2022-04-01 LAB — TYPE AND SCREEN
ABO/RH(D): A POS
Antibody Screen: NEGATIVE
Unit division: 0

## 2022-04-01 MED ORDER — INSULIN GLARGINE-YFGN 100 UNIT/ML ~~LOC~~ SOLN
15.0000 [IU] | Freq: Every day | SUBCUTANEOUS | Status: DC
  Administered 2022-04-01: 15 [IU] via SUBCUTANEOUS
  Filled 2022-04-01 (×2): qty 0.15

## 2022-04-01 MED ORDER — CLOPIDOGREL BISULFATE 75 MG PO TABS
75.0000 mg | ORAL_TABLET | Freq: Every day | ORAL | Status: DC
  Administered 2022-04-01 – 2022-04-02 (×2): 75 mg via ORAL
  Filled 2022-04-01 (×2): qty 1

## 2022-04-01 MED ORDER — ENSURE ENLIVE PO LIQD
237.0000 mL | Freq: Three times a day (TID) | ORAL | Status: DC
  Administered 2022-04-01 – 2022-04-02 (×4): 237 mL via ORAL

## 2022-04-01 NOTE — TOC Progression Note (Signed)
Transition of Care (TOC) - Progression Note  ? ? ?Patient Details  ?Name: Johnathan Keith ?MRN: [id number] ?Date of Birth: 05-Jun-1961 ? ?Transition of Care (TOC) CM/SW Contact  ?Bartholomew Crews, RN ?Phone Number: [phone] ?04/01/2022, 1:19 PM ? ?Clinical Narrative:    ? ?Spoke with [place] at [place] concerning anticipated DC tomorrow per MD. [place] to follow up with transportation request. TOC following. ? ?  ?  ? ?Expected Discharge Plan and Services ?  ?  ?  ?  ?  ?                ?  ?  ?  ?  ?  ?  ?  ?  ?  ?  ? ? ?Social Determinants of Health (SDOH) Interventions ?  ? ?Readmission Risk Interventions ?   ? View : No data to display.  ?  ?  ?  ? ? ?

## 2022-04-01 NOTE — Progress Notes (Addendum)
Initial Nutrition Assessment ? ?DOCUMENTATION CODES:  ? ?Non-severe (moderate) malnutrition in context of acute illness/injury ? ?INTERVENTION:  ? ?Add Ensure Enlive po TID, each supplement provides 350 kcal and 20 grams of protein. ? ?Continue Prosight multivitamin daily. ? ?Continue Juven 1 packet PO BID, each packet provides 80 calories, 8 grams of carbohydrate, 2.5  grams of protein (collagen), 7 grams of L-arginine and 7 grams of L-glutamine; supplement contains CaHMB, Vitamins C, E, B12 and Zinc to promote wound healing. ? ?Continue regular diet.  ? ?NUTRITION DIAGNOSIS:  ? ?Moderate Malnutrition related to acute illness (R heel abscess causing poor appetite) as evidenced by mild muscle depletion, mild fat depletion. ? ?GOAL:  ? ?Patient will meet greater than or equal to 90% of their needs ? ?MONITOR:  ? ?PO intake, Supplement acceptance, Labs, Skin ? ?REASON FOR ASSESSMENT:  ? ?Consult ?Assessment of nutrition requirement/status ? ?ASSESSMENT:  ? ?61 yo male admitted with R heel abscess and cellulitis. PMH includes DM-2, HTN, HLD, CAD, stroke, vision impairment, wheelchair bound, COPD, GERD, polyneuropathy. ? ?4/19 - S/P R BKA ? ?Spoke with patient and his wife at bedside. Wife reports that patient has been eating poorly since admission, but intake has improved with liberalization of diet to regular. Meal intakes documented at 50-100% since yesterday. He drinks Glucerna shakes at home. Agreed to drink chocolate Ensure Enlive/Plus between meals while in the hospital to increase protein and calorie intake. He is drinking Juven BID. Also receiving Prosight MVI daily.  ? ?Weight history reviewed.  ?Over the past year, weight has declined from 111.1 kg (03/14/21) to 93.2 kg (03/21/22). 16% weight loss within a year is not significant for the time frame, but it is concerning, given recent poor oral intake and non healing wound.   ? ?Patient meets criteria for moderate malnutrition given mild depletion of muscle and  subcutaneous fat mass.  ? ?NUTRITION - FOCUSED PHYSICAL EXAM: ? ?Flowsheet Row Most Recent Value  ?Orbital Region Mild depletion  ?Upper Arm Region Mild depletion  ?Thoracic and Lumbar Region No depletion  ?Buccal Region No depletion  ?Temple Region Mild depletion  ?Clavicle Bone Region Mild depletion  ?Clavicle and Acromion Bone Region No depletion  ?Scapular Bone Region No depletion  ?Dorsal Hand No depletion  ?Patellar Region Unable to assess  ?Anterior Thigh Region Unable to assess  ?Posterior Calf Region Unable to assess  ?Edema (RD Assessment) Unable to assess  ?Hair Reviewed  ?Eyes Reviewed  ?Mouth Reviewed  ?Skin Reviewed  ?Nails Reviewed  ? ?  ? ? ?Diet Order:   ?Diet Order   ? ?       ?  Diet regular Room service appropriate? Yes; Fluid consistency: Thin  Diet effective now       ?  ? ?  ?  ? ?  ? ? ?EDUCATION NEEDS:  ? ?Education needs have been addressed ? ?Skin:  Skin Assessment: Skin Integrity Issues: ?Skin Integrity Issues:: Stage I, Wound VAC ?Stage I: bilateral buttocks ?Wound Vac: R BKA ? ?Last BM:  4/23 ? ?Height:  ? ?Ht Readings from Last 1 Encounters:  ?03/20/22 5\' 6"  (1.676 m)  ? ? ?Weight:  ? ?Wt Readings from Last 1 Encounters:  ?03/21/22 93.2 kg  ? ? ?BMI:  Body mass index is 33.16 kg/m?. ? ?Estimated Nutritional Needs:  ? ?Kcal:  2300-2500 ? ?Protein:  115-130 gm ? ?Fluid:  2.3-2.5 L ? ? ? ?Lucas Mallow RD, LDN, CNSC ?Please refer to Amion for contact information.                                                       ? ?

## 2022-04-01 NOTE — Progress Notes (Signed)
Inpatient Diabetes Program Recommendations ? ?AACE/ADA: New Consensus Statement on Inpatient Glycemic Control (2015) ? ?Target Ranges:  Prepandial:   less than 140 mg/dL ?     Peak postprandial:   less than 180 mg/dL (1-2 hours) ?     Critically ill patients:  140 - 180 mg/dL  ? ?Lab Results  ?Component Value Date  ? GLUCAP 282 (H) 04/01/2022  ? HGBA1C 9.5 (H) 03/21/2022  ? ? ?Review of Glycemic Control ? Latest Reference Range & Units 03/30/22 15:58 03/30/22 20:31 03/31/22 07:33 03/31/22 11:48 03/31/22 16:31 03/31/22 21:13 04/01/22 07:28  ?Glucose-Capillary 70 - 99 mg/dL 026 (H) 378 (H) 588 (H) 229 (H) 264 (H) 272 (H) 282 (H)  ? ?Diabetes history: DM 2 ?Outpatient Diabetes medications:  ?Semglee 45 unit bid ?Humalog 2-16 units tid with meals ?Current orders for Inpatient glycemic control:  ?Novolog moderate tid with meals and HS ? ?Inpatient Diabetes Program Recommendations:   ? ?Consider restarting Semglee 15 units daily.  ? ?Thanks,  ?Beryl Meager, RN, BC-ADM ?Inpatient Diabetes Coordinator ?Pager [phone]  (8a-5p) ? ? ?

## 2022-04-01 NOTE — Progress Notes (Signed)
?PROGRESS NOTE ? ?Johnathan Keith  ?DOB: [birth year]  ?PCP: Johnathan Greenhouse, MD ?[id number]  ?DOA: 03/20/2022 ? LOS: 12 days  ?Hospital Day: 13 ? ?Brief narrative: ?Johnathan Keith is a 61 y.o. male with PMH significant for DM2, HTN, HLD, CAD/stent, h/o stroke with generalized weakness, vision impairment, wheelchair-bound status, COPD, GERD, polyneuropathy, diabetic foot who lives at home. ?Patient was seen at [facility] and found to have localized abscess and transferred to [facility].   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   ?Overall very poor clinical status. ? ?Subjective: ?Patient was seen and examined this morning.   ?No significant change in last 24 hours and his physical and mental status.  He is weak, slow to respond and mostly sleepy.  He says he prefers to keep his eyes closed all the time.  Wife at bedside. ?Hemoglobin improved after blood transfusion.  GI follow-up appreciated.  No plan of endoscopy and colonoscopy this hospitalization.  Plavix has been resumed. ? ?Principal Problem: ?  Right foot infection ?Active Problems: ?  Type 2 diabetes mellitus with diabetic neuropathy, with long-term current use of insulin ([place]) ?  Acute kidney injury ([place]) ?  Coronary artery disease with history of myocardial infarction without history of CABG ?  Hypertension ?  Pressure injury of skin ?  ? ?Assessment and Plan: ?Acute on chronic anemia ?-Patient has history of chronic anemia but with baseline hemoglobin more than 10 consistently.  He had EGD and colonoscopy in the past with Dr. Lyndel Safe.  Patient reports he had polyps in the stomach.  He has a family history of colon cancer as well.   ?-During this hospitalization, he did not have any active bleeding but his hemoglobin trended down to the lowest of 6.6.  1 unit of PRBC was transfused after which hemoglobin appropriately improved to 8.9.   ?-GI consult was obtained.  No need of intervention at this time. ?-Patient to continue aspirin and Plavix. ?Recent  Labs  ?  03/28/22 ?1013 03/28/22 ?1505 03/30/22 ?0136 03/31/22 ?0120 04/01/22 ?[id number]  ?HGB 8.8* 8.6* 7.7* 6.6* 8.9*  ?MCV 97.9 98.2 98.0 99.5 94.9  ?[id number]  --   --   --  341  --   ?FOLATE  --   --   --  26.2  --   ?FERRITIN  --   --   --  128  --   ?TIBC  --   --   --  185*  --   ?IRON  --   --   --  24*  --   ?RETICCTPCT  --   --   --  2.7  --   ? ?Acute kidney injury ?-Baseline not available.  Creatinine steadily increased to peak at 2.15 on 4/22.  Improving with IV fluid, 1.85 today ?-Continue IV fluid with NS at 100 mill per hour. ?-Continue to hold torsemide, Aldactone and scheduled potassium. ?Recent Labs  ?  03/20/22 ?2116 03/21/22 ?[id number] 03/22/22 ?[id number] 03/23/22 ?[id number] 03/24/22 ?[id number] 03/27/22 ?[id number] 03/28/22 ?1013 03/30/22 ?0136 03/31/22 ?0120 04/01/22 ?[id number]  ?BUN 29* 30* 29* 28* 27* 35* 39* 40* 41* 50*  ?CREATININE 1.34* 1.03 1.37* 1.39* 1.36* 1.68* 1.92* 2.15* 2.05* 1.85*  ? ?Chronic diastolic CHF ?Essential hypertension ?-Echo from 4/17 with EF 55 to 60%, mild LVH, grade 1 diastolic dysfunction ?-PTA, patient was on metoprolol, torsemide, Aldactone.  Continue metoprolol.  Continue to hold orders because of AKI.  Blood pressure mostly in normal range and is stable ? ?Right heel abscess and  cellulitis, nonhealing wound:  ?-MRI consistent with calcaneal osteomyelitis. ?-Blood cultures negative. ?-4/19, underwent right BKA, pathology showed clear margins. Treated with broad-spectrum antibiotics for more than 1 week.  Antibiotics stopped. ?  ?Acute metabolic encephalopathy ?-4/20, rapid response was called as patient was less responsive. ?-Patient has poor baseline physical and mental status to begin with.  Mental status worsened probably because of infection and sedation. ?-4/20, CT head did not show any acute intracranial abnormality.  Ammonia level 26.  Electrolytes normal.  Blood gas without evidence of hypercapnia. ?-On subsequent evaluations, patient patient was alert, awake, slow to respond.  Able to  tell me his name and oriented to place.  Able to follow motor commands but is slow.  Per his wife, his mental status is functional with intermittent confusion at home.  Every time he gets hospitalized, he goes through periods of delirium like this.  Continue to monitor mental status change. ? ?Uncontrolled type 2 diabetes mellitus with hyperglycemia ?Diabetic neuropathy ?-A1c 9.5 on 03/21/2022 ?-Currently on sliding scale insulin with Accu-Cheks.  4/20, Semglee was discontinued because of lethargy and poor oral intake. ?-Blood sugar level gradually trending up and is consistently more than 200.   ?-We will resume Semglee at 15 units today. ?Recent Labs  ?Lab 03/31/22 ?[id number] 03/31/22 ?1148 03/31/22 ?1631 03/31/22 ?2113 04/01/22 ?[id number]  ?GLUCAP 177* 229* 264* 272* 282*  ? ?History of stroke, generalized debility, right hemiparesis, right sided blindness ?-Patient has above-mentioned chronic neurological deficits and also experiencing gradual loss of vision in the left eye.  Reportedly seen by ophthalmology on 4/6 in the office, showed diabetic proliferative retinopathy and increased intraocular pressure.  ?-Per recommendation by ophthalmologist, echocardiogram and angiogram were obtained to rule out retinal artery stenosis ?-4/17 MR angiogram did not show any large vessel occlusion.  Showed mild to moderate scattered intracranial atherosclerosis. ?-4/17, echocardiogram with EF 55 to 60%, no wall motion abnormality , G1DD ?-Continue Cosopt eyedrops. ? ?BPH ?-Blood pressure stable on Proscar.  ?  ?CAD/MI ?-Continue aspirin, Plavix, Lipitor and metoprolol.   ? ?Poor oral intake ?-Dietitian consulted ?  ?Goal of care: ?Very poor baseline status, bedbound.   ?Patient has difficulty coping and recovering from multiple illnesses and surgery. Continue supportive care. ?Overall poor outcome,  ?Per previous hospitalist, multiple previous discussions were held about hospice and DNR.  Patient's wife is a [employer] manager and works at a memory  care unit.  She wants him full code and to continue aggressive cares.  Palliative care consult appreciated. ? ?Goals of care ?  Code Status: Full Code  ? ? ?Mobility: Limited mobility.  Seen by PT. ? ?Skin assessment:  ?Pressure Injury 03/20/22 Buttocks Medial;Left;Right Stage 1 -  Intact skin with non-blanchable redness of a localized area usually over a bony prominence. (Active)  ?03/20/22 2113  ?Location: Buttocks  ?Location Orientation: Medial;Left;Right  ?Staging: Stage 1 -  Intact skin with non-blanchable redness of a localized area usually over a bony prominence.  ?Wound Description (Comments):   ?Present on Admission: Yes  ? ? ?Nutritional status:  ?Body mass index is 33.16 kg/m?.  ?  ?  ? ? ? ? ?Diet:  ?Diet Order   ? ?       ?  Diet regular Room service appropriate? Yes; Fluid consistency: Thin  Diet effective now       ?  ? ?  ?  ? ?  ? ? ?DVT prophylaxis:  ?SCD's Start: 03/27/22 2033 ?  ?Antimicrobials: Completed course of antibiotics ?Fluid:  NS at 100 mill per hour ?Consultants: Palliative care, GI ?Family Communication: None at bedside ? ?Status is: Inpatient ? ?Continue in-hospital care because: If creatinine continues to improve, likely discharge to home tomorrow with home health and services through [facility] program. ?Level of care: Med-Surg  ? ?Dispo: The patient is from: Home ?             Anticipated d/c is to: Home with home care ?             Patient currently is not medically stable to d/c. ?  Difficult to place patient No ? ? ? ? ?Infusions:  ? sodium chloride 100 mL/hr at 03/30/22 2316  ? magnesium sulfate bolus IVPB    ? ? ?Scheduled Meds: ? allopurinol  100 mg Oral Daily  ? vitamin C  1,000 mg Oral Daily  ? aspirin EC  81 mg Oral Daily  ? atorvastatin  80 mg Oral Daily  ? bethanechol  10 mg Oral TID  ? budesonide  0.25 mg Nebulization BID  ? carbidopa-levodopa  0.5 tablet Oral BID  ? cholecalciferol  1,000 Units Oral Q1200  ? clopidogrel  75 mg Oral Daily  ? dorzolamide-timolol  1 drop Both  Eyes BID  ? ferrous sulfate  325 mg Oral Daily  ? finasteride  5 mg Oral Daily  ? gabapentin  100 mg Oral BID  ? insulin aspart  0-15 Units Subcutaneous TID WC  ? insulin aspart  0-5 Units Subcutaneous QHS

## 2022-04-01 NOTE — Telephone Encounter (Signed)
[employer] forms received for spouse. To [employer]. ?

## 2022-04-01 NOTE — TOC Progression Note (Signed)
Transition of Care (TOC) - Progression Note  ? ? ?Patient Details  ?Name: Johnathan Keith ?MRN: [id number] ?Date of Birth: December 15, 1960 ? ?Transition of Care (TOC) CM/SW Contact  ?Bartholomew Crews, RN ?Phone Number: [phone] ?04/01/2022, 10:11 AM ? ?Clinical Narrative:    ? ?Spoke with patient and spouse at the bedside to discuss post acute transition. Patient is followed by [facility] Patient has all needed DME including home oxygen. Patient will need ambulance transport home at time of discharge.  ? ?Attending at bedside during visit. GI consult pending.  ? ?Left voicemail for [place], [place] at [place]. Contact information provided.  ? ?TOC following for transition needs.  ?  ?  ? ?Expected Discharge Plan and Services ?  ?  ?  ?  ?  ?                ?  ?  ?  ?  ?  ?  ?  ?  ?  ?  ? ? ?Social Determinants of Health (SDOH) Interventions ?  ? ?Readmission Risk Interventions ?   ? View : No data to display.  ?  ?  ?  ? ? ?

## 2022-04-01 NOTE — Progress Notes (Addendum)
? ?       Daily Rounding Note ? ?04/01/2022, 10:30 AM ? LOS: 12 days  ? ?SUBJECTIVE:   ?Chief complaint:    Anemia. ? ?Small, brown stool yesterday.  No nausea, vomiting, abdominal pain. ? ?OBJECTIVE:        ? Vital signs in last 24 hours:    ?Temp:  [97.3 ?F (36.3 ?C)-98 ?F (36.7 ?C)] 98 ?F (36.7 ?C) (04/24 6063) ?Pulse Rate:  [68-90] 79 (04/24 0724) ?Resp:  [15-20] 16 (04/24 0724) ?BP: (115-162)/(57-81) 139/68 (04/24 0724) ?SpO2:  [99 %-100 %] 99 % (04/24 0746) ?Last BM Date : 03/31/22 (small per pt) ?Filed Weights  ? 03/20/22 2117 03/21/22 0025  ?Weight: 90.2 kg 93.2 kg  ? ?General: Obese, looks chronically unwell and pale.  Lethargic. ?Heart: RRR. ?Chest: No labored breathing or cough.  No adventitious sounds. ?Abdomen: Obese, not tender.  Soft.  Bowel sounds active ?Extremities: Right BKA site heavily bandaged.  No edema on the left. ?Neuro/Psych: Oriented x3 but tangential historian.  Somewhat lethargic and sleepy on presentation but awakens easily.  No observed tremor. ? ?Intake/Output from previous day: ?04/23 0701 - 04/24 0700 ?In: 2677.1 [P.O.:320; I.V.:2357.1] ?Out: 900 [Urine:900] ? ?Intake/Output this shift: ?Total I/O ?In: 240 [P.O.:240] ?Out: 1600 [Urine:1600] ? ?Lab Results: ?Recent Labs  ?  03/30/22 ?0136 03/31/22 ?0120 04/01/22 ?0160  ?WBC 12.7* 10.8* 10.8*  ?HGB 7.7* 6.6* 8.9*  ?HCT 24.1* 21.8* 27.7*  ?PLT 197 228 252  ? ?BMET ?Recent Labs  ?  03/30/22 ?0136 03/31/22 ?0120 04/01/22 ?1093  ?NA 140 139 139  ?K 3.8 3.8 4.3  ?CL 114* 114* 114*  ?CO2 19* 18* 20*  ?GLUCOSE 147* 213* 311*  ?BUN 40* 41* 50*  ?CREATININE 2.15* 2.05* 1.85*  ?CALCIUM 8.8* 8.6* 9.1  ? ? ?Studies/Results: ?No results found. ? ?Scheduled Meds: ? allopurinol  100 mg Oral Daily  ? vitamin C  1,000 mg Oral Daily  ? aspirin EC  81 mg Oral Daily  ? atorvastatin  80 mg Oral Daily  ? bethanechol  10 mg Oral TID  ? budesonide  0.25 mg Nebulization BID  ? carbidopa-levodopa  0.5 tablet  Oral BID  ? cholecalciferol  1,000 Units Oral Q1200  ? dorzolamide-timolol  1 drop Both Eyes BID  ? ferrous sulfate  325 mg Oral Daily  ? finasteride  5 mg Oral Daily  ? gabapentin  100 mg Oral BID  ? insulin aspart  0-15 Units Subcutaneous TID WC  ? insulin aspart  0-5 Units Subcutaneous QHS  ? levothyroxine  25 mcg Oral QAC breakfast  ? lipase/protease/amylase  36,000 Units Oral BID WC  ? mirtazapine  15 mg Oral QHS  ? montelukast  10 mg Oral QHS  ? multivitamin  1 tablet Oral Daily  ? nutrition supplement (JUVEN)  1 packet Oral BID BM  ? pantoprazole  40 mg Oral BID AC  ? senna-docusate  1 tablet Oral QHS  ? tamsulosin  0.4 mg Oral QPC supper  ? zinc sulfate  220 mg Oral Daily  ? ?Continuous Infusions: ? sodium chloride 100 mL/hr at 03/30/22 2316  ? magnesium sulfate bolus IVPB    ? ?PRN Meds:.alum & mag hydroxide-simeth, bisacodyl, guaiFENesin-dextromethorphan, hydrALAZINE, HYDROcodone-acetaminophen, HYDROcodone-acetaminophen, HYDROmorphone (DILAUDID) injection, labetalol, magnesium sulfate bolus IVPB, metoprolol tartrate, ondansetron (ZOFRAN) IV, phenol, [START ON 04/02/2022] polyethylene glycol, potassium chloride ? ? ?ASSESMENT:  ? ?Normocytic anemia.  Acute on chronic.  Suspect IDA +/- anemia of chronic disease.  PO iron PTA.   ? ?  Right foot abscess, osteomyelitis.  03/27/2022 right BKA, wound VAC placement. ? ?Previous CVA, right-sided weakness, wheelchair-bound. ? ?CAD, CHF, previous PCI, CVA, on chronic dual antiplatelet therapy Plavix/ASA.  PVD.  Status post CEA. ? ?Chronic pain syndrome. ? ?Chronic abdominal discomfort and diarrhea.  Colonoscopy 2018 for diarrhea.  6 mm tubular adenomatous polyp removed, random biopsies unrevealing.  no BMs for the last several days.  On pancreatic enzyme replacement.  EGD 2020 for dysphagia.  No stricture, underwent empiric dilation and multiple biopsied fundic gland polyps, no H. pylori.  Protonix 40 mg po chronically.   ? ?  IDDM.    ? ? Parkinsons dz?, on Sinemet.    ? ? ?PLAN  ? ?  Holding Plavix, last dose 4/23 AM.  Pursue endoscopic studies per decision (pndg) of Dr Russella Dar (colon, EGD?).    ? ? ?Jennye Moccasin  04/01/2022, 10:30 AM ?Phone [phone]  ? ? ? Attending Physician Note  ? ?I have taken an interval history, reviewed the chart and examined the patient. I performed more than 50% of this encounter in conjunction with the APP. I agree with the APP's note, impression and recommendations with my edits. My additional impressions and recommendations are as follows.  ? ?Normocytic anemia, ACD +/- IDA. Hgb improved post transfusion. He is chronically debilitated, wheelchair bound post CVA and recovering from right BKA on 4/19. He does not appear fit for bowel prep, colonoscopy at this time. After a discussion with the patient and his wife the decision was made to defer colonoscopy and EGD at this time to allow recovery / rehab from right BKA. OK to resume Plavix. Outpatient GI follow up with Edman Circle, MD to consider colonoscopy/EGD. GI signing off.  ? ? ?Claudette Head, MD [facility] ?See AMION, [facility], for our on call provider  ?

## 2022-04-02 ENCOUNTER — Telehealth: Payer: Self-pay

## 2022-04-02 DIAGNOSIS — L089 Local infection of the skin and subcutaneous tissue, unspecified: Secondary | ICD-10-CM | POA: Diagnosis not present

## 2022-04-02 DIAGNOSIS — E440 Moderate protein-calorie malnutrition: Secondary | ICD-10-CM | POA: Insufficient documentation

## 2022-04-02 LAB — BASIC METABOLIC PANEL
Anion gap: 5 (ref 5–15)
BUN: 46 mg/dL — ABNORMAL HIGH (ref 8–23)
CO2: 21 mmol/L — ABNORMAL LOW (ref 22–32)
Calcium: 9.4 mg/dL (ref 8.9–10.3)
Chloride: 116 mmol/L — ABNORMAL HIGH (ref 98–111)
Creatinine, Ser: 1.84 mg/dL — ABNORMAL HIGH (ref 0.61–1.24)
GFR, Estimated: 41 mL/min — ABNORMAL LOW (ref >60)
Glucose, Bld: 299 mg/dL — ABNORMAL HIGH (ref 70–99)
Potassium: 3.9 mmol/L (ref 3.5–5.1)
Sodium: 142 mmol/L (ref 135–145)

## 2022-04-02 LAB — CBC WITH DIFFERENTIAL/PLATELET
Abs Immature Granulocytes: 0.05 10*3/uL (ref 0.00–0.07)
Basophils Absolute: 0 10*3/uL (ref 0.0–0.1)
Basophils Relative: 0 %
Eosinophils Absolute: 0.2 10*3/uL (ref 0.0–0.5)
Eosinophils Relative: 2 %
HCT: 27.5 % — ABNORMAL LOW (ref 39.0–52.0)
Hemoglobin: 8.7 g/dL — ABNORMAL LOW (ref 13.0–17.0)
Immature Granulocytes: 1 %
Lymphocytes Relative: 17 %
Lymphs Abs: 1.8 10*3/uL (ref 0.7–4.0)
MCH: 30.4 pg (ref 26.0–34.0)
MCHC: 31.6 g/dL (ref 30.0–36.0)
MCV: 96.2 fL (ref 80.0–100.0)
Monocytes Absolute: 0.9 10*3/uL (ref 0.1–1.0)
Monocytes Relative: 9 %
Neutro Abs: 7.5 10*3/uL (ref 1.7–7.7)
Neutrophils Relative %: 71 %
Platelets: 279 10*3/uL (ref 150–400)
RBC: 2.86 MIL/uL — ABNORMAL LOW (ref 4.22–5.81)
RDW: 15.9 % — ABNORMAL HIGH (ref 11.5–15.5)
WBC: 10.4 10*3/uL (ref 4.0–10.5)
nRBC: 0 % (ref 0.0–0.2)

## 2022-04-02 LAB — GLUCOSE, CAPILLARY
Glucose-Capillary: 312 mg/dL — ABNORMAL HIGH (ref 70–99)
Glucose-Capillary: 320 mg/dL — ABNORMAL HIGH (ref 70–99)

## 2022-04-02 MED ORDER — INSULIN GLARGINE-YFGN 100 UNIT/ML ~~LOC~~ SOPN: 30.0000 [IU] | PEN_INJECTOR | Freq: Every day | SUBCUTANEOUS | Status: DC

## 2022-04-02 MED ORDER — INSULIN GLARGINE-YFGN 100 UNIT/ML ~~LOC~~ SOLN
30.0000 [IU] | Freq: Every day | SUBCUTANEOUS | Status: DC
  Filled 2022-04-02: qty 0.3

## 2022-04-02 MED ORDER — POTASSIUM CHLORIDE CRYS ER 20 MEQ PO TBCR: 20.0000 meq | EXTENDED_RELEASE_TABLET | Freq: Every day | ORAL | Status: DC | PRN

## 2022-04-02 MED ORDER — ENSURE ENLIVE PO LIQD: 237.0000 mL | Freq: Three times a day (TID) | ORAL | 12 refills | Status: DC

## 2022-04-02 MED ORDER — TORSEMIDE 20 MG PO TABS: 20.0000 mg | ORAL_TABLET | Freq: Every day | ORAL | Status: DC | PRN

## 2022-04-02 MED ORDER — TAMSULOSIN HCL 0.4 MG PO CAPS: 0.4000 mg | ORAL_CAPSULE | Freq: Every day | ORAL | Status: DC

## 2022-04-02 NOTE — Progress Notes (Signed)
PT Cancellation Note ? ?Patient Details ?Name: Johnathan Keith ?MRN: [id number] ?DOB: 04/18/1961 ? ? ?Cancelled Treatment:    Reason Eval/Treat Not Completed: Patient preparing for d/c home via [facility], will defer treatment; confirmed plan with pt's wife. ? ?Ina Homes, PT, DPT ?Acute Rehabilitation Services  ?Pager [phone] ?Office [phone] ? ?Malachy Chamber ?04/02/2022, 12:16 PM ?

## 2022-04-02 NOTE — Plan of Care (Signed)
?  Problem: Clinical Measurements: ?Goal: Ability to maintain clinical measurements within normal limits will improve ?Outcome: Not Progressing ?  ?Problem: Nutrition: ?Goal: Adequate nutrition will be maintained ?Outcome: Not Progressing ?  ?Problem: Elimination: ?Goal: Will not experience complications related to bowel motility ?Outcome: Not Progressing ?  ?Problem: Skin Integrity: ?Goal: Risk for impaired skin integrity will decrease ?Outcome: Not Progressing ?  ?

## 2022-04-02 NOTE — Telephone Encounter (Signed)
I called and tried to trouble shoot the vac with pt's wife by phone the alarming is coming from a leak in the seal. Alert turned off and advised that they will need to come in the office tomorrow. Pt goes to [facility] during the day and per wife needs authorization for appts.  I called and lm on vm for the direct Lucius Conn [phone] asking for the pt to be able to come into the office tomorrow for a nurse only visit to apply additional drape and secure the seal. Sending this message to you to follow up with the pt's wife and the facility tomorrow and see when they can bring him in.  ?

## 2022-04-02 NOTE — Plan of Care (Signed)

## 2022-04-02 NOTE — Telephone Encounter (Signed)
Charity called from [facility] and advised that they have some drape at the facility and they will call if there are any issues. Just FYI  ?

## 2022-04-02 NOTE — Discharge Summary (Signed)
? ?Physician Discharge Summary  ?Johnathan Keith [id number] DOB: 1961/07/11 DOA: 03/20/2022 ? ?PCP: Algis Greenhouse, MD ? ?Admit date: 03/20/2022 ?Discharge date: 04/02/2022 ? ?Admitted From: Home ?Discharge disposition: Return home to PACS/[facility] services ? ?Recommendations at discharge:  ? Resume torsemide daily as needed with potassium supplementation.  Stop Aldactone. ?Recommend to monitor blood pressure at home, repeat BMP at the next visit. ? ? ?Brief narrative: ?Johnathan Keith is a 61 y.o. male with PMH significant for DM2, HTN, HLD, CAD/stent, h/o stroke with generalized weakness, vision impairment, wheelchair-bound status, COPD, GERD, polyneuropathy, diabetic foot who lives at home. ?Patient was seen at [facility] and found to have localized abscess and transferred to [facility].   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   ?Overall very poor clinical status. ? ?Subjective: ?Patient was seen and examined this morning.   ?Comfortable in bed.  Awake, slow to respond as always.  Wife at bedside. ?Creatinine stabilized.  Hemoglobin stable. ?Patient and wife both are happy with the discharge plan today ? ?Principal Problem: ?  Right foot infection ?Active Problems: ?  Type 2 diabetes mellitus with diabetic neuropathy, with long-term current use of insulin ([place]) ?  Acute kidney injury ([place]) ?  Coronary artery disease with history of myocardial infarction without history of CABG ?  Hypertension ?  Pressure injury of skin ?  Malnutrition of moderate degree ?  ? 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 ?-Blood cultures negative. ?-4/19, underwent right BKA, pathology showed clear margins. Treated with broad-spectrum antibiotics for more than 1 week.  Antibiotics stopped. ?  ?Acute metabolic encephalopathy ?-4/20, rapid response was called as patient was less responsive. ?-Patient has poor baseline physical and  mental status to begin with.  Mental status worsened probably because of infection and sedation. ?-4/20, CT head did not show any acute intracranial abnormality. Ammonia level 26.  Electrolytes normal. Blood gas without evidence of hypercapnia. ?-On subsequent evaluations, patient was alert, awake, slow to respond.  Able to tell me his name and oriented to place.  Able to follow motor commands but is slow.  Per his wife, his mental status is functional with intermittent confusion at home.  Every time he gets hospitalized, he goes through periods of delirium like this.  Gradually improving mental status. ? ?Acute on chronic anemia ?-Patient has history of chronic anemia but with baseline hemoglobin more than 10 consistently.  He had EGD and colonoscopy in the past with Dr. Lyndel Safe.  Patient reports he had polyps in the stomach.  He has a family history of colon cancer as well.   ?-During this hospitalization, he did not have any active bleeding but his hemoglobin trended down to the lowest of 6.6.  1 unit of PRBC was transfused after which hemoglobin appropriately improved to 8.9.  Hemoglobin has remained stable in last 24 hours. ?-GI consult was obtained.  No need of intervention at this time. ?-Patient to continue aspirin and Plavix. ?Recent Labs  ?  03/28/22 ?1505 03/30/22 ?0136 03/31/22 ?0120 04/01/22 ?[id number] 04/02/22 ?0243  ?HGB 8.6* 7.7* 6.6* 8.9* 8.7*  ?MCV 98.2 98.0 99.5 94.9 96.2  ?[id number]  --   --  341  --   --   ?FOLATE  --   --  26.2  --   --   ?FERRITIN  --   --  128  --   --   ?TIBC  --   --  185*  --   --   ?  IRON  --   --  24*  --   --   ?RETICCTPCT  --   --  2.7  --   --   ? ?Acute kidney injury ?-Baseline not available.  Creatinine steadily increased to peak at 2.15 on 4/22.  Gradually improved with IV fluid.  Plateaued at 1.84. ?-Currently torsemide and Aldactone on hold.  ?-Resume torsemide as needed postdischarge. ?Recent Labs  ?  03/21/22 ?[id number] 03/22/22 ?[id number] 03/23/22 ?[id number] 03/24/22 ?[id number]  03/27/22 ?[id number] 03/28/22 ?1013 03/30/22 ?0136 03/31/22 ?0120 04/01/22 ?[id number] 04/02/22 ?0243  ?BUN 30* 29* 28* 27* 35* 39* 40* 41* 50* 46*  ?CREATININE 1.03 1.37* 1.39* 1.36* 1.68* 1.92* 2.15* 2.05* 1.85* 1.84*  ? ?Chronic diastolic CHF ?Essential hypertension ?-Echo from 4/17 with EF 55 to 60%, mild LVH, grade 1 diastolic dysfunction ?-PTA, patient was on metoprolol, torsemide, Aldactone.  Continue metoprolol.  -Currently torsemide and Aldactone on hold.  Blood pressure has remained stable which is partly because of additional Flomax. Post discharge, resume torsemide daily as needed, and with potassium supplementation.  Stop Aldactone. ?-Patient's wife states that he has appointment with cardiologist next 3 days.  Recommend to monitor blood pressure at home, repeat BMP at the next visit. ? ?CAD/MI ?-Continue aspirin, Plavix, Lipitor and metoprolol.   ? ?Uncontrolled type 2 diabetes mellitus with hyperglycemia ?Diabetic neuropathy ?-A1c 9.5 on 03/21/2022 ?-PTA, patient was on Lantus 45 units twice daily. ?-4/20, Semglee was discontinued because of lethargy and poor oral intake.  Continue dextrose drip for more than 48 hours.  Appetite improved gradually.  Semglee was reintroduced.  He was given 15 units of Semglee last night.  Blood sugar level close to 300 today.  At discharge, I would suggest resume Lantus at 30 units nightly.  Based on blood sugar readings, gradually increase the dose and frequency. ?Recent Labs  ?Lab 04/01/22 ?1306 04/01/22 ?1708 04/01/22 ?2141 04/02/22 ?[id number] 04/02/22 ?1128  ?GLUCAP 340* 297* 284* 312* 320*  ? ?History of stroke, generalized debility, right hemiparesis, right sided blindness ?-Patient has above-mentioned chronic neurological deficits and also experiencing gradual loss of vision in the left eye.  Reportedly seen by ophthalmology on 4/6 in the office, showed diabetic proliferative retinopathy and increased intraocular pressure.  ?-Per recommendation by ophthalmologist, echocardiogram  and angiogram were obtained to rule out retinal artery stenosis ?-4/17 MR angiogram did not show any large vessel occlusion.  Showed mild to moderate scattered intracranial atherosclerosis. ?-4/17, echocardiogram with EF 55 to 60%, no wall motion abnormality , G1DD ?-Continue Cosopt eyedrops. ? ?BPH ?-Continue Proscar, Flomax ?  ?Poor oral intake ?-Dietitian consulted ?  ?Goal of care: ?Very poor baseline status, bedbound.   ?Patient has difficulty coping and recovering from multiple illnesses and surgery. Continue supportive care. ?Overall poor outcome,  ?Per previous hospitalist, multiple previous discussions were held about hospice and DNR.  Patient's wife is a [employer] manager and works at a memory care unit.  She wants him full code and to continue aggressive cares.  Palliative care consult appreciated. ? ?Goals of care ?  Code Status: Full Code  ? ? ?Mobility: Limited mobility.  Seen by PT. ? ?  ? ?Skin assessment:  ?Pressure Injury 03/20/22 Buttocks Medial;Left;Right Stage 1 -  Intact skin with non-blanchable redness of a localized area usually over a bony prominence. (Active)  ?03/20/22 2113  ?Location: Buttocks  ?Location Orientation: Medial;Left;Right  ?Staging: Stage 1 -  Intact skin with non-blanchable redness of a localized area usually over a bony prominence.  ?Wound Description (Comments):   ?  Present on Admission: Yes  ? ? ?Nutritional status:  ?Body mass index is 33.16 kg/m?Marland Kitchen  ?Nutrition Problem: Moderate Malnutrition ?Etiology: acute illness (R heel abscess causing poor appetite) ?Signs/Symptoms: mild muscle depletion, mild fat depletion ? ? ? ? ? ?Diet:  ?Diet Order   ? ?       ?  Diet - low sodium heart healthy       ?  ?  Diet Carb Modified       ?  ?  Diet regular Room service appropriate? Yes; Fluid consistency: Thin  Diet effective now       ?  ? ?  ?  ? ?  ? ? ? ?Wounds:  ?- ?Wound / Incision (Open or Dehisced) 03/20/22 Diabetic ulcer Heel Right;Posterior black eschar and yellow periwound (Active)  ?Date  First Assessed/Time First Assessed: 03/20/22 2111   Wound Type: Diabetic ulcer  Location: Heel  Location Orientation: Right;Posterior  Wound Description (Comments): black eschar and yellow periwound  Present on Ad

## 2022-04-02 NOTE — Telephone Encounter (Signed)
Patient's wife called stating that prevena wound vac is alarming and that she wasn't given any instructions.  Cb# [phone].  Patient had right BKA 03/27/2022.  Please advise.  Thank you. ?

## 2022-04-02 NOTE — Progress Notes (Signed)
Patient ID: Johnathan Keith, male   DOB: 01-19-1961, 61 y.o.   MRN: [id number] ?Patient is much more alert this morning.  Plan for discharge to home and follow-up with pace.  I plugged in the Praveena pump.  Patient will discharge with the portable Praveena pump.  There is no drainage in the wound VAC canister. ?

## 2022-04-03 ENCOUNTER — Telehealth: Payer: Self-pay

## 2022-04-03 NOTE — Telephone Encounter (Signed)
-----   Message from Dianah Field, PA-C sent at 04/01/2022 12:00 PM EDT ----- ?Regarding: arrange fup visit ?Hello.  Can you arrange fup visit w Dr Chales Abrahams for Mr Johnathan Keith? ?Reason for visit is iron def anemia on top of anemia chronic dz. ?Pt inpt after R BKA (osteomyelitis), taking Plavix for hx CVA.  Wheelchair bound. ? ?Dr Russella Dar felt pt no clinically ready  for EGD or colonoscopy  now and requests GI fup as outpt.   ? ?Thanks, S ? ?

## 2022-04-03 NOTE — Telephone Encounter (Signed)
Left message for pt to call back  °

## 2022-04-03 NOTE — Telephone Encounter (Signed)
Noted no calls today. ?

## 2022-04-04 ENCOUNTER — Encounter: Payer: Self-pay | Admitting: Cardiology

## 2022-04-04 ENCOUNTER — Ambulatory Visit (INDEPENDENT_AMBULATORY_CARE_PROVIDER_SITE_OTHER): Payer: No Typology Code available for payment source | Admitting: Cardiology

## 2022-04-04 ENCOUNTER — Telehealth: Payer: Self-pay

## 2022-04-04 ENCOUNTER — Telehealth: Payer: Self-pay | Admitting: Orthopedic Surgery

## 2022-04-04 VITALS — BP 120/60 | HR 76 | Ht 66.0 in

## 2022-04-04 DIAGNOSIS — E7800 Pure hypercholesterolemia, unspecified: Secondary | ICD-10-CM

## 2022-04-04 DIAGNOSIS — I110 Hypertensive heart disease with heart failure: Secondary | ICD-10-CM

## 2022-04-04 DIAGNOSIS — I25118 Atherosclerotic heart disease of native coronary artery with other forms of angina pectoris: Secondary | ICD-10-CM

## 2022-04-04 MED ORDER — METOPROLOL SUCCINATE ER 25 MG PO TB24: 25.0000 mg | ORAL_TABLET | Freq: Every day | ORAL | 3 refills | Status: DC

## 2022-04-04 MED ORDER — POTASSIUM CHLORIDE ER 10 MEQ PO TBCR: 10.0000 meq | EXTENDED_RELEASE_TABLET | Freq: Every day | ORAL | 3 refills | Status: DC

## 2022-04-04 MED ORDER — TORSEMIDE 20 MG PO TABS: 20.0000 mg | ORAL_TABLET | Freq: Every day | ORAL | 3 refills | Status: DC

## 2022-04-04 NOTE — Telephone Encounter (Signed)
[facility] called and states that pt had surgery on 4/19. He also got the wound vac put on, on 04/19. Pt was discharged on 4/25 and is suppose to follow up a week out which would be 05/05. Pt has an appt on 05/05 but nurse is wondering if wound vac needs to come out before then.  ? ?CB [phone] -- Lori ?

## 2022-04-04 NOTE — Progress Notes (Signed)
?Cardiology Office Note:   ? ?Date:  04/04/2022  ? ?ID:  Johnathan Keith, DOB [birth year], MRN [id number] ? ?PCP:  Algis Greenhouse, MD  ?Cardiologist:  Shirlee More, MD   ? ?Referring MD: Algis Greenhouse, MD  ? ? ?ASSESSMENT:   ? ?1. Hypertensive heart disease with heart failure ([place])   ?2. Coronary artery disease of native artery of native heart with stable angina pectoris ([place])   ?3. High cholesterol   ? ?PLAN:   ? ?In order of problems listed above: ? ?His heart failure is decompensated fluid overloaded as well put his diuretic back to daily along with potassium ?Stable CAD not sure there is a specific reason his beta-blocker is stopped-resume 25 mg long-acting metoprolol daily ?Continue his high intensity statin with CAD and PAD ? ? ?Next appointment: 6 months ? ? ?Medication Adjustments/Labs and Tests Ordered: ?Current medicines are reviewed at length with the patient today.  Concerns regarding medicines are outlined above.  ?No orders of the defined types were placed in this encounter. ? ?No orders of the defined types were placed in this encounter. ? ? ?Chief Complaint  ?Patient presents with  ? Follow-up  ?For CAD and heart failure ? ?History of Present Illness:   ? ?Johnathan Keith is a 61 y.o. male with a hx of CAD hypertensive heart disease with heart failure ejection fraction 40 to 45% hyperlipidemia diabetes carotid disease with a right carotid endarterectomy and stroke in June 2020  last seen 09/25/2021.Coronary angiography was performed 09/29/2017 showed an 80% mid LAD stenosis with PCI and previous stent drug-eluting stent 2016 proximal mid LAD patent.     ? ?Compliance with diet, lifestyle and medications: Yes ? ?He has no active stay well senior ?His beta-blocker was discontinued during his last hospitalization ?Diuretic was decreased to every other day ?He is more edematous with fortunately is not short of breath ?He has had no angina orthopnea palpitation or syncope. ?His daughter is present tells me  he was transfused during his last hospitalization hemoglobin less than 7 recent labs 03/28/2022 hemoglobin 8.6 creatinine 1.92 potassium 4.4. ?Past Medical History:  ?Diagnosis Date  ? Abnormal nuclear cardiac imaging test   ? High-risk nuclear stress test   ? Adjustment disorder with mixed anxiety and depressed mood 07/27/2020  ? AKI (acute kidney injury) ([place]) 03/26/2020  ? Arthritis   ? Bicuspid aortic valve 03/27/2020  ? Formatting of this note might be different from the original. 2020: ECHO  ? Bilateral low back pain without sciatica 09/13/2015  ? Bilateral lower extremity edema 08/18/2018  ? Bilateral lower extremity edema  Formatting of this note might be different from the original. Bilateral lower extremity edema  Last Assessment & Plan:  He does have 1-2+ pitting edema.  I am going to increase his Lasix to 80 mg a day, and will check a basic metabolic panel in 7 to 10 days.  He will see Cyril Mourning back in 1 month for follow-up of lab work.  ? 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 Bradycardia 04/07/2020  ? CAD -S/P PCI-2016 11/30/2015  ? LAD DES 2016. Admitted to [facility] 2017 with SOB, not felt to be in CHF, Myoview was negative for sichemia  Formatting of this note might be different from the original. Overview:  LAD DES 2016. Admitted to [facility] Sept 2017 with SOB, not felt to be in CHF, Myoview was negative for sichemia  Last Assessment & Plan:  History of CAD status post  LAD stenting by myself in 2016 with re-intervention because o  ? Carotid stenosis 04/07/2013  ? Rt CEA Oct 2015-Dr Chen  Last Assessment & Plan:  History of carotid artery disease status post right carotid endarterectomy by Dr. Bridgett Larsson October 2015 which he follows by duplex ultrasound. Formatting of this note might be different from the original. Rt CEA Oct 2015-Dr Bridgett Larsson  Last Assessment & Plan:  History of carotid artery disease status post right carotid endarterectomy by Dr. Bridgett Larsson October 2015  ? Carpal tunnel syndrome  07/17/2015  ? 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[place]) 09/13/2015  ? Cerebrovascular accident (CVA) due to thrombosis of right middle cerebral artery ([place]) 07/04/2014  ? Questionable small stroke in 2014 with transient Lt sided weakness in setting of carotid disease.  Formatting of this note might be different from the original. Overview:  Questionable small stroke in 2014 with transient Lt sided weakness in setting of carotid disease.  Last Assessment & Plan:  History of multiple strokes in the past dating back to 2014 with a recent stroke in April of this year a  ? Cervical spondylosis without myelopathy 01/31/2019  ? Chronic diarrhea 12/09/2018  ? Chronic hypoxemic respiratory failure ([place]) 06/19/2020  ? Chronic pain disorder 01/31/2019  ? Coronary artery disease   ? drug-eluting stents placed in proximal and mid LAD  ? 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 Deviated septum   ? Diabetic retinopathy ([place])   ? Dyslipidemia 12/09/2018  ? Dyspnea on exertion 03/26/2020  ? Dyspnea on exertion  Formatting of this note might be different from the original. Overview:  Dyspnea on exertion  Last Assessment & Plan:  Improved after stenting of his LAD  ? Erectile dysfunction 11/25/2015  ? Essential hypertension 04/07/2013  ? hypertension  Formatting of this note might be different from the original. hypertension  Last Assessment & Plan:  History of essential hypertension her blood pressure measured at 162/70.  He is on benazepril 10 the morning and 20 in the evening as well as hydralazine, hydrochlorothiazide, and metoprolol.  Blood pressures have been on the high side.  I am going to increase his benazepril to 20 mg   ? GERD (gastroesophageal reflux disease)   ? "occasionally" (03/23/2015)  ? Heart attack ([place])   ? "in 1997 was told I had signs of a small heart attack that I didn't know I'd had"  ? History of gout   ? "when I was younger"  ? History of kidney stones   ?  passed  ? Hypogonadism male 01/11/2016  ? Large liver 12/09/2018  ? Memory loss 03/15/2020  ? Migraine   ? "had my 1st and only in 12/2014" (03/23/2015)  ? Occipital neuralgia of right side 12/18/2016  ? Peripheral neuropathy   ? feet  ? Peripheral vascular disease ([place])   ? carotid artery disease  ? PONV (postoperative nausea and vomiting) 2015  ? "was told I was confused and combative in recovery from the narcotics w/my carotid OR"  "During a colonoscopy my oxygen level dropped so low that I had to be woke up.- 03/2017 colonoscopy [facility]   ? Sleep apnea   ? "severe; couldn't afford mask" (03/23/2015) uses O2 at night.no C-pap  ? SOB (shortness of breath) 05/04/2019  ? 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 Stroke ([place]) 05/10/2019  ? Aphasia mild  ? Transaminitis 05/08/2019  ? Type 2 diabetes mellitus with diabetic neuropathy ([place]) 12/09/2018  ? ? ?  Past Surgical History:  ?Procedure Laterality Date  ? AMPUTATION Right 03/27/2022  ? Procedure: RIGHT BELOW KNEE AMPUTATION;  Surgeon: Newt Minion, MD;  Location: [place];  Service: Orthopedics;  Laterality: Right;  ? APPLICATION OF WOUND VAC Right 03/27/2022  ? Procedure: APPLICATION OF WOUND VAC;  Surgeon: Newt Minion, MD;  Location: [place];  Service: Orthopedics;  Laterality: Right;  ? BILIARY DILATION  08/11/2019  ? Procedure: BILIARY DILATION;  Surgeon: Rush Landmark Telford Nab., MD;  Location: [place];  Service: Gastroenterology;;  ? BIOPSY  08/11/2019  ? Procedure: BIOPSY;  Surgeon: Irving Copas., MD;  Location: [place];  Service: Gastroenterology;;  ? CHOLECYSTECTOMY N/A 08/31/2019  ? Procedure: LAPAROSCOPIC CHOLECYSTECTOMY;  Surgeon: Greer Pickerel, MD;  Location: [facility];  Service: General;  Laterality: N/A;  ? CIRCUMCISION  1981  ? COLONOSCOPY W/ POLYPECTOMY    ? CORONARY ANGIOPLASTY WITH STENT PLACEMENT  03/23/2015  ? "2"  ? CORONARY STENT INTERVENTION N/A 09/29/2017  ? Procedure: CORONARY STENT INTERVENTION;  Surgeon: Lorretta Harp,  MD;  Location: [place] [facility];  Service: Cardiovascular;  Laterality: N/A;  ? ENDARTERECTOMY Right 09/26/2014  ? Procedure: RIGHT CAROTID ENDARTERECTOMY WITH PATCH ANGIOPLASTY;  Surgeon: Conrad Energy,

## 2022-04-04 NOTE — Patient Instructions (Signed)
Medication Instructions:  ?Your physician has recommended you make the following change in your medication:  ? ?START: Torsemide 20 mg daily ?START: Potasium 10 meq daily ?START: Toprol XL 25 mg daily ? ?*If you need a refill on your cardiac medications before your next appointment, please call your pharmacy* ? ? ?Lab Work: ?None ?If you have labs (blood work) drawn today and your tests are completely normal, you will receive your results only by: ?MyChart Message (if you have MyChart) OR ?A paper copy in the mail ?If you have any lab test that is abnormal or we need to change your treatment, we will call you to review the results. ? ? ?Testing/Procedures: ?None ? ? ?Follow-Up: ?At [facility], you and your health needs are our priority.  As part of our continuing mission to provide you with exceptional heart care, we have created designated Provider Care Teams.  These Care Teams include your primary Cardiologist (physician) and Advanced Practice Providers (APPs -  Physician Assistants and Nurse Practitioners) who all work together to provide you with the care you need, when you need it. ? ?We recommend signing up for the patient portal called "MyChart".  Sign up information is provided on this After Visit Summary.  MyChart is used to connect with patients for Virtual Visits (Telemedicine).  Patients are able to view lab/test results, encounter notes, upcoming appointments, etc.  Non-urgent messages can be sent to your provider as well.   ?To learn more about what you can do with MyChart, go to [email or web address].   ? ?Your next appointment:   ?6 month(s) ? ?The format for your next appointment:   ?In Person ? ?Provider:   ?Norman Herrlich, MD  ? ? ?Other Instructions ?None ? ?Important Information About Sugar ? ? ? ? ? ? ?

## 2022-04-04 NOTE — Telephone Encounter (Signed)
Spoke with Pt spouse Jad Johansson in regard to recommendation to set up an appointment to see Dr. Chales Abrahams: Darl Pikes stated pt is part of the [facility] program: Darl Pikes stated that I would need to contact Lawson Fiscal at [facility] [phone] to schedule an appointment :  ?Lawson Fiscal contacted and made aware of the recommendation to schedule a Follow up appointment after recent hospitalization: Lawson Fiscal stated that that they would have to get authorization from Dr. Konrad Felix to schedule appointment: Fax number provided: Phone Number Provided: Lawson Fiscal stated that she would contact us and make appt for pt after receiving authorization:  ?Lawson Fiscal verbalized understanding with all questions answered.  ?  ?

## 2022-04-05 NOTE — Telephone Encounter (Signed)
Called and sw Lawson Fiscal and the pt has a 14 day vac on so he would need to come on on 04/10/2022 to remove. Appt sch and cx the one for Friday. Will call with any questions.  ?

## 2022-04-08 ENCOUNTER — Telehealth: Payer: Self-pay

## 2022-04-08 NOTE — Telephone Encounter (Signed)
Received FMLA form from [employer] regarding patients spouse Johnathan Keith. ?Per Dr Rosanne Sack form needs to be sent to patients primary care.  ?Formed faxed back to [employer] with instructions to forward to patients pcp ?

## 2022-04-10 ENCOUNTER — Ambulatory Visit (INDEPENDENT_AMBULATORY_CARE_PROVIDER_SITE_OTHER): Payer: No Typology Code available for payment source | Admitting: Family

## 2022-04-10 ENCOUNTER — Encounter: Payer: Self-pay | Admitting: Family

## 2022-04-10 DIAGNOSIS — Z89511 Acquired absence of right leg below knee: Secondary | ICD-10-CM

## 2022-04-10 NOTE — Progress Notes (Signed)
? ?Post-Op Visit Note ?  ?Patient: Johnathan Keith           ?Date of Birth: 06-Nov-1961           ?MRN: [id number] ?Visit Date: 04/10/2022 ?PCP: Algis Greenhouse, MD ? ?Chief Complaint: No chief complaint on file. ? ? ?HPI:  ?HPI ?The patient is a 61 year old gentleman seen status post right below-knee amputation April 19 his wound VAC was removed today. ?Ortho Exam ?On exam his right residual limb is well approximated with sutures the incision is approximated well there is no gaping no drainage no erythema no sign of infection ? ?Visit Diagnoses: No diagnosis found. ? ?Plan: Begin daily Dial soap cleansing.  Dry dressing changes.  Shrinker or Ace wrap for compression.  Follow-up in 2 weeks anticipate suture removal at that time ? ?Follow-Up Instructions: Return in about 2 weeks (around 04/24/2022).  ? ?Imaging: ?No results found. ? ?Orders:  ?No orders of the defined types were placed in this encounter. ? ?No orders of the defined types were placed in this encounter. ? ? ? ?PMFS History: ?Patient Active Problem List  ? Diagnosis Date Noted  ? Malnutrition of moderate degree 04/02/2022  ? Pressure injury of skin 03/25/2022  ? Right foot infection 03/20/2022  ? Candidal urinary tract infection 07/02/2021  ? Pneumonia due to COVID-19 virus 07/02/2021  ? Anemia 06/14/2021  ? Arthritis   ? Kidney disease   ? Hypertension   ? Enteritis 11/30/2020  ? Clostridium difficile diarrhea 11/23/2020  ? Morbid obesity ([place]) 11/08/2020  ? Coronary artery disease   ? Chronic neck and back pain   ? Hemiplegia of left nondominant side as late effect of cerebral infarction ([place]) 10/19/2020  ? Peripheral neuropathy 09/18/2020  ? Migraine 09/18/2020  ? Diabetic retinopathy ([place]) 09/18/2020  ? Type II diabetes mellitus ([place])   ? Pneumonia   ? Peripheral vascular disease ([place])   ? Deviated septum   ? Headache   ? Heart attack ([place])   ? High cholesterol   ? History of gout   ? History of kidney stones   ? Adjustment disorder with mixed anxiety  and depressed mood 07/27/2020  ? Chronic hypoxemic respiratory failure ([place]) 06/19/2020  ? Community acquired pneumonia 06/19/2020  ? Bradycardia 04/07/2020  ? Bicuspid aortic valve 03/27/2020  ? Dyspnea on exertion 03/26/2020  ? Acute kidney injury ([place]) 03/26/2020  ? AKI (acute kidney injury) ([place]) 03/26/2020  ? Memory loss 03/15/2020  ? Cholecystitis 06/24/2019  ? Abdominal distention   ? Acute febrile illness 05/08/2019  ? Transaminitis 05/08/2019  ? Serum total bilirubin elevated 05/08/2019  ? Sepsis ([place]) 05/08/2019  ? Acute pancreatitis 05/08/2019  ? SOB (shortness of breath) 05/04/2019  ? Arthropathy of cervical facet joint 01/31/2019  ? Neural foraminal stenosis of cervical spine 01/31/2019  ? Cervical spondylosis without myelopathy 01/31/2019  ? Chronic pain disorder 01/31/2019  ? Diabetic polyneuropathy associated with type 2 diabetes mellitus ([place]) 01/31/2019  ? DDD (degenerative disc disease), cervical 01/31/2019  ? 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 Lumbar foraminal stenosis 01/31/2019  ? Abnormal CT scan 12/09/2018  ? Cholelithiases 12/09/2018  ? Chronic diarrhea 12/09/2018  ? 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 Dyslipidemia 12/09/2018  ? History of cardioembolic cerebrovascular accident (CVA) 12/09/2018  ? Large liver 12/09/2018  ? Type 2 diabetes mellitus with diabetic neuropathy ([place]) 12/09/2018  ? Elevated troponin 12/09/2018  ? Bilateral lower extremity edema 08/18/2018  ? History of stroke 04/20/2018  ?  Sleep apnea 02/27/2017  ? Occipital neuralgia of right side 12/18/2016  ? 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 Non morbid obesity due to excess calories 02/12/2016  ? Type 2 diabetes mellitus, with long-term current use of insulin ([place]) 02/12/2016  ? Hypogonadism male 01/11/2016  ? CAD -S/P PCI-2016 11/30/2015  ? Erectile dysfunction 11/25/2015  ? 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[place]) 09/13/2015  ?  Bilateral low back pain without sciatica 09/13/2015  ? Carpal tunnel syndrome 07/17/2015  ? Lower back pain 06/14/2015  ? Lumbar facet joint pain 06/14/2015  ? Lymphocytosis 05/24/2015  ? Neutrophilia 05/24/2015  ? Abnormal nuclear cardiac imaging test   ? Carotid artery stenosis- 09/26/2014  ? Cerebrovascular accident (CVA) due to thrombosis of right middle cerebral artery ([place]) 07/04/2014  ? Type 2 diabetes mellitus with diabetic neuropathy, with long-term current use of insulin ([place]) 07/04/2014  ? History of CVA (cerebrovascular accident) 07/04/2014  ? PONV (postoperative nausea and vomiting) 2015  ? Hyperlipidemia, unspecified 04/07/2013  ? Essential hypertension 04/07/2013  ? Carotid stenosis 04/07/2013  ? Cervical spine disease 04/07/2013  ? Stroke, lacunar ([place]) 04/07/2013  ? Coronary artery disease with history of myocardial infarction without history of CABG 04/07/2013  ? Diabetes mellitus type 2, uncontrolled 04/07/2013  ? GERD (gastroesophageal reflux disease) 04/07/2013  ? Occlusion and stenosis of unspecified carotid artery 04/07/2013  ? Stroke [facility]) 2014  ? ?Past Medical History:  ?Diagnosis Date  ? Abnormal nuclear cardiac imaging test   ? High-risk nuclear stress test   ? Adjustment disorder with mixed anxiety and depressed mood 07/27/2020  ? AKI (acute kidney injury) ([place]) 03/26/2020  ? Arthritis   ? Bicuspid aortic valve 03/27/2020  ? Formatting of this note might be different from the original. 2020: ECHO  ? Bilateral low back pain without sciatica 09/13/2015  ? Bilateral lower extremity edema 08/18/2018  ? Bilateral lower extremity edema  Formatting of this note might be different from the original. Bilateral lower extremity edema  Last Assessment & Plan:  He does have 1-2+ pitting edema.  I am going to increase his Lasix to 80 mg a day, and will check a basic metabolic panel in 7 to 10 days.  He will see Cyril Mourning back in 1 month for follow-up of lab work.  ? BPH with obstruction/lower urinary  tract symptoms 02/20/2016  ? Bradycardia 04/07/2020  ? CAD -S/P PCI-2016 11/30/2015  ? LAD DES 2016. Admitted to [facility] Sept 2017 with SOB, not felt to be in CHF, Myoview was negative for sichemia  Formatting of this note might be different from the original. Overview:  LAD DES 2016. Admitted to [facility] Sept 2017 with SOB, not felt to be in CHF, Myoview was negative for sichemia  Last Assessment & Plan:  History of CAD status post LAD stenting by myself in 2016 with re-intervention because o  ? Carotid stenosis 04/07/2013  ? Rt CEA Oct 2015-Dr Chen  Last Assessment & Plan:  History of carotid artery disease status post right carotid endarterectomy by Dr. Bridgett Larsson October 2015 which he follows by duplex ultrasound. Formatting of this note might be different from the original. Rt CEA Oct 2015-Dr Bridgett Larsson  Last Assessment & Plan:  History of carotid artery disease status post right carotid endarterectomy by Dr. Bridgett Larsson October 2015  ? Carpal tunnel syndrome 07/17/2015  ? 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[place]) 09/13/2015  ? Cerebrovascular accident (CVA) due to thrombosis of right  middle cerebral artery ([place]) 07/04/2014  ? Questionable small stroke in 2014 with transient Lt sided weakness in setting of carotid disease.  Formatting of this note might be different from the original. Overview:  Questionable small stroke in 2014 with transient Lt sided weakness in setting of carotid disease.  Last Assessment & Plan:  History of multiple strokes in the past dating back to 2014 with a recent stroke in April of this year a  ? Cervical spondylosis without myelopathy 01/31/2019  ? Chronic diarrhea 12/09/2018  ? Chronic hypoxemic respiratory failure ([place]) 06/19/2020  ? Chronic pain disorder 01/31/2019  ? Coronary artery disease   ? drug-eluting stents placed in proximal and mid LAD  ? 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 Deviated septum   ? Diabetic retinopathy ([place])   ? Dyslipidemia 12/09/2018   ? Dyspnea on exertion 03/26/2020  ? Dyspnea on exertion  Formatting of this note might be different from the original. Overview:  Dyspnea on exertion  Last Assessment & Plan:  Improved after stenting of his L

## 2022-04-11 ENCOUNTER — Telehealth: Payer: Self-pay

## 2022-04-11 NOTE — Telephone Encounter (Signed)
Carollee Herter, OT with [facility] would like to know if patient has a ROM plan, therapy recommendations, and removing of splint.  If so, please fax to [phone].  CB# [phone].  Please advise.  Thank you. ?

## 2022-04-12 ENCOUNTER — Encounter: Payer: No Typology Code available for payment source | Admitting: Family

## 2022-04-12 ENCOUNTER — Encounter: Payer: Self-pay | Admitting: Family

## 2022-04-12 NOTE — Telephone Encounter (Signed)
Per Junie Panning, okay for HHPT and HHOT eval and treat, can remove splint for bending of knee. ?

## 2022-04-12 NOTE — Telephone Encounter (Signed)
Order written and faxing to # below. ?

## 2022-04-12 NOTE — Telephone Encounter (Signed)
faxed

## 2022-04-17 ENCOUNTER — Ambulatory Visit (INDEPENDENT_AMBULATORY_CARE_PROVIDER_SITE_OTHER): Payer: No Typology Code available for payment source | Admitting: Gastroenterology

## 2022-04-17 VITALS — BP 122/62 | HR 75 | Ht 66.0 in

## 2022-04-17 DIAGNOSIS — K8050 Calculus of bile duct without cholangitis or cholecystitis without obstruction: Secondary | ICD-10-CM

## 2022-04-17 DIAGNOSIS — K529 Noninfective gastroenteritis and colitis, unspecified: Secondary | ICD-10-CM

## 2022-04-17 DIAGNOSIS — Z8619 Personal history of other infectious and parasitic diseases: Secondary | ICD-10-CM

## 2022-04-17 DIAGNOSIS — R1310 Dysphagia, unspecified: Secondary | ICD-10-CM

## 2022-04-17 DIAGNOSIS — K219 Gastro-esophageal reflux disease without esophagitis: Secondary | ICD-10-CM

## 2022-04-17 MED ORDER — CHOLESTYRAMINE 4 G PO PACK: 4.0000 g | PACK | Freq: Every day | ORAL | 6 refills | Status: DC

## 2022-04-17 NOTE — Progress Notes (Signed)
? ? ? ?IMPRESSION and PLAN:   ? ?#1.Chronic diarrhea - likely d/t IBS-D, meds, EPI (low fecal elastase in past), post-chole diarrhea, diabetic diarrhea, meds. R/O infections. Neg colon 2015 and 2018 (except for small tubular adenoma), neg TI bx for crohn's and neg random colon Bx for microscopic colitis. CT scan A/P showing ileitis.  No previous H/O Crohn's disease.  ? ?#2. Choledocholithiasis s/p ERCP with ES and stone extraction 08/11/2019. S/O lap chole 08/31/2019. ? ?#3. GERD with dysphagia (dysphagia has resolved after dilatation 08/11/2019). ? ?#3. H/O C. Diff colitis (off vancomycin since 07/18/2019). Neg C. Diff 08/2019 ?  ?#5.  Multiple co-morbid conditions including CAD s/p PTCA, DM2, HTN, OSA, CVA 2016, recent R BKA 03/27/2022, now wheelchair-bound. ? ?#6. FH of colon cancer (brother at age 61s) ? ?    ?Plan: ?-Stool for c diff, GI pathogen, calprotectin ?-Stool for occult blood ?-Continue Lomotil 3/day prn (60), 6 refills. ?-Continue protonix 40mg  po qd. ?-trial of cholestryamine 4g po QD #30, 2hr before or after rest of meds ?-Continue creon (Lip 40K) 2 with each meal. ?-Currently, not appropriate for EGD/colon d/t multiple comorbidities and recent surgery ?-FU in 12 weeks. At FU, if clinical status improves, EGD/colon at [facility]. ?-Trend CBC @ [facility] as planned. ?-Discussed in detail with the patient and patient's daughter.  Daughter will get in touch with [place] in case of any active GI bleeding. ?HPI:   ? ?61yr old with DM2, HTN, HLD, dCHF (EF 55 to 60%), CAD/stent on Plavix, h/o stroke , vision impairment, wheelchair-bound, COPD, GERD, polyneuropathy, diabetic foot who lives at home. ? 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 ?Here for FU visit. ? ?Accompanied by his  daughter.  Wife is a CNA in memory care unit ? ?With continued diarrhea 4-5 Bms/day, which is his baseline.  I do not see any stool studies during recent hospitalization. ? ?No nausea, vomiting, heartburn, regurgitation, odynophagia or dysphagia.  No constipation.   ? ?No melena or hematochezia.  ? ?No abdominal pain. ? ?Currently wants to hold off on GI evaluation. ? ? ?From previous records: ?Cholecystostomy cholangiogram 06/30/2019-showed small cystic duct stone, small CBD stone without any significant ductal dilatation. It does show cholelithiasis as well. ? ?Underwent ERCP with biliary sphincterotomy and stone extraction on 08/11/2019.  Also had EGD with dilatation prior to ERCP.  Esophagus was dilated to 17 mm savory.  This has resulted in complete resolution of dysphagia. ? ?His heartburn is under good control with Protonix. ? ?Underwent laparoscopic cholecystectomy on 08/31/2019.  ? ?No jaundice dark urine or pale stools.  He denies having any fever or chills. ? ? ?Past GI work-up: ? ?CT AP 11/2020 ?1. Mild mucosal enhancement with borderline wall thickening of the ?distal and terminal ileum, suggesting enteritis. This may be ?infectious or inflammatory including Crohn's disease. ?2. Fluid/liquid stool in the cecum, ascending, and proximal ?transverse colon, can be seen with diarrheal illness or rapid ?transit. No bowel obstruction. Normal appendix. ?3. Hepatic steatosis. ? ?EGD 08/2019 ?- No gross mucosal lesions in esophagus. Tortuous esophagus distally (mild). Z-line ?regular, 39 cm from the incisors. Savary dilation performed in the entire esophagus. ?- A large amount of food (residue) in the stomach. ?- Multiple gastric polyps - likely fundic gland. Biopsied. ?- Gastritis. Biopsied. ?- No  gross lesions in the duodenal bulb, in the first portion of the duodenum, in the second ?portion of the duodenum and in the major papilla. ? ?Neg colon 2015 and 2018 (except for small tubular adenoma), neg TI bx for crohn's  and neg random colon Bx for microscopic colitis. ? ?Past Medical History:  ?Diagnosis Date  ? Abnormal nuclear cardiac imaging test   ? High-risk nuclear stress test   ? Adjustment disorder with mixed anxiety and depressed mood 07/27/2020  ? AKI (acute kidney injury) ([place]) 03/26/2020  ? Arthritis   ? Bicuspid aortic valve 03/27/2020  ? Formatting of this note might be different from the original. 2020: ECHO  ? Bilateral low back pain without sciatica 09/13/2015  ? Bilateral lower extremity edema 08/18/2018  ? Bilateral lower extremity edema  Formatting of this note might be different from the original. Bilateral lower extremity edema  Last Assessment & Plan:  He does have 1-2+ pitting edema.  I am going to increase his Lasix to 80 mg a day, and will check a basic metabolic panel in 7 to 10 days.  He will see Cyril Mourning back in 1 month for follow-up of lab work.  ? 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 Bradycardia 04/07/2020  ? CAD -S/P PCI-2016 11/30/2015  ? LAD DES 2016. Admitted to [facility] Sept 2017 with SOB, not felt to be in CHF, Myoview was negative for sichemia  Formatting of this note might be different from the original. Overview:  LAD DES 2016. Admitted to [facility] Sept 2017 with SOB, not felt to be in CHF, Myoview was negative for sichemia  Last Assessment & Plan:  History of CAD status post LAD stenting by myself in 2016 with re-intervention because o  ? Carotid stenosis 04/07/2013  ? Rt CEA Oct 2015-Dr Chen  Last Assessment & Plan:  History of carotid artery disease status post right carotid endarterectomy by Dr. Bridgett Larsson October 2015 which he follows by duplex ultrasound. Formatting of this note might be different from the original. Rt CEA Oct 2015-Dr Bridgett Larsson  Last Assessment & Plan:  History of carotid artery disease status post right carotid endarterectomy by Dr. Bridgett Larsson October 2015  ? Carpal tunnel syndrome 07/17/2015  ? 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[place])  09/13/2015  ? Cerebrovascular accident (CVA) due to thrombosis of right middle cerebral artery ([place]) 07/04/2014  ? Questionable small stroke in 2014 with transient Lt sided weakness in setting of carotid disease.  Formatting of this note might be different from the original. Overview:  Questionable small stroke in 2014 with transient Lt sided weakness in setting of carotid disease.  Last Assessment & Plan:  History of multiple strokes in the past dating back to 2014 with a recent stroke in April of this year a  ? Cervical spondylosis without myelopathy 01/31/2019  ? Chronic diarrhea 12/09/2018  ? Chronic hypoxemic respiratory failure ([place]) 06/19/2020  ? Chronic pain disorder 01/31/2019  ? Coronary artery disease   ? drug-eluting stents placed in proximal and mid LAD  ? 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 Deviated septum   ? Diabetic retinopathy ([place])   ? Dyslipidemia 12/09/2018  ? Dyspnea on exertion 03/26/2020  ? Dyspnea on exertion  Formatting of this note might be different from the original. Overview:  Dyspnea on exertion  Last Assessment & Plan:  Improved after stenting of his LAD  ? Erectile dysfunction 11/25/2015  ? Essential hypertension 04/07/2013  ? hypertension  Formatting of this note might be different from the original. hypertension  Last Assessment & Plan:  History of essential hypertension her blood pressure measured at 162/70.  He is on benazepril 10 the morning and 20 in the evening as well as hydralazine, hydrochlorothiazide, and metoprolol.  Blood pressures have been on the high side.  I am going to increase his benazepril to 20 mg   ? GERD (gastroesophageal reflux disease)   ? "occasionally" (03/23/2015)  ? Heart attack ([place])   ? "in 1997 was told I had signs of a small heart attack that I didn't know I'd had"  ? History of gout   ? "when I was younger"  ? History of kidney stones   ? passed  ? Hypogonadism male 01/11/2016  ? Large liver 12/09/2018  ? Memory loss 03/15/2020   ? Migraine   ? "had my 1st and only in 12/2014" (03/23/2015)  ? Occipital neuralgia of right side 12/18/2016  ? Peripheral neuropathy   ? feet  ? Peripheral vascular disease ([place])   ? carotid artery disease

## 2022-04-17 NOTE — Patient Instructions (Addendum)
If you are age 63 or older, your body mass index should be between 23-30. Your Body mass index is 33.16 kg/m?Marland Kitchen If this is out of the aforementioned range listed, please consider follow up with your Primary Care Provider. ? ?If you are age 65 or younger, your body mass index should be between 19-25. Your Body mass index is 33.16 kg/m?Marland Kitchen If this is out of the aformentioned range listed, please consider follow up with your Primary Care Provider.  ? ?________________________________________________________ ? ?The [facility] providers would like to encourage you to use [facility] to communicate with providers for non-urgent requests or questions.  Due to long hold times on the telephone, sending your provider a message by [facility] may be a faster and more efficient way to get a response.  Please allow 48 business hours for a response.  Please remember that this is for non-urgent requests.  ?_______________________________________________________ ? ?Please go to the lab on the 2nd floor [address] before you leave the office today.  ? ?Continue current medications ? ?Please mail the hemoccult cards back to [address] [place] [address]  ? ?We have given you a script for Questran. Take this medications 2 hours before or after all other medications. ? ?Please call in 12 weeks to schedule an office visit ? ?Thank you, ? ?Dr. Lynann Bologna ? ? ? ? ? ? ?We want to thank you for trusting [facility] with your care. All of our staff and providers value the relationships we have built with our patients, and it is an honor to care for you.  ? ?We are writing to let you know that [facility] will close on Apr 22, 2022, and we invite you to continue to see Dr. Edman Circle and Doristine Locks at the [facility] [place] office location. We are consolidating our serices at these [facility] practices to better provide care. Our office staff will work with you to ensure a  seamless transition.  ? ?Doristine Locks, DO -Dr. Barron Alvine will be movig to [facility] at [address], [place], [place] [address], effective Apr 22, 2022.  Contact [phone] to schedule an appointment with him.  ? ?Edman Circle, MD- Dr. Chales Abrahams will be movig to [facility] at [address] [place], [place] [address], effective Apr 22, 2022.  Contact [phone] to schedule an appointment with him.  ? ?Requesting Medical Records ?If you need to request your medical records, please follow the instructions below. Your medical records are confidential, and a copy can be transferred to another provider or released to you or another person you designate only with your permission. ? ?There are several ways to request your medical records: ?Requests for medical records can be submitted through our practice.   ?You can also request your records electronically, in your MyChart account by selecting the ?Request Health Records? tab.  ?If you need additional information on how to request records, please go to [email or web address], choose Patient Information, then select Request Medical Records. ?To make an appointment or if you have any questions about your health care needs, please contact our office at [phone] and one of our staff members will be glad to assist you. ?[facility] is committed to providing exceptional care for you and our community. Thank you for allowing [place] to serve your health care needs. ?Sincerely, ? ?Trixie Dredge, Director [facility] ?[facility] also offers convenient virtual care options. Sore throat? Sinus problems? Cold or flu  symptoms? Get care from the comfort of home with [facility] Video Visits and e-Visits. Learn more about the non-emergency conditions treated and start your virtual visit at [email or web address] ? ? ?

## 2022-04-19 ENCOUNTER — Ambulatory Visit: Payer: No Typology Code available for payment source | Admitting: Vascular Surgery

## 2022-04-19 ENCOUNTER — Encounter (HOSPITAL_COMMUNITY): Payer: No Typology Code available for payment source

## 2022-04-23 ENCOUNTER — Other Ambulatory Visit: Payer: No Typology Code available for payment source

## 2022-04-23 DIAGNOSIS — R1310 Dysphagia, unspecified: Secondary | ICD-10-CM

## 2022-04-23 DIAGNOSIS — Z8619 Personal history of other infectious and parasitic diseases: Secondary | ICD-10-CM

## 2022-04-23 DIAGNOSIS — K8050 Calculus of bile duct without cholangitis or cholecystitis without obstruction: Secondary | ICD-10-CM

## 2022-04-23 DIAGNOSIS — K529 Noninfective gastroenteritis and colitis, unspecified: Secondary | ICD-10-CM

## 2022-04-23 DIAGNOSIS — K219 Gastro-esophageal reflux disease without esophagitis: Secondary | ICD-10-CM

## 2022-04-24 ENCOUNTER — Ambulatory Visit (INDEPENDENT_AMBULATORY_CARE_PROVIDER_SITE_OTHER): Payer: No Typology Code available for payment source | Admitting: Family

## 2022-04-24 ENCOUNTER — Encounter: Payer: No Typology Code available for payment source | Admitting: Family

## 2022-04-24 ENCOUNTER — Encounter: Payer: Self-pay | Admitting: Family

## 2022-04-24 DIAGNOSIS — Z89511 Acquired absence of right leg below knee: Secondary | ICD-10-CM

## 2022-04-24 LAB — CLOSTRIDIUM DIFFICILE TOXIN B, QUALITATIVE, REAL-TIME PCR: Toxigenic C. Difficile by PCR: NOT DETECTED

## 2022-04-24 NOTE — Progress Notes (Signed)
? ?Post-Op Visit Note ?  ?Patient: Johnathan Keith           ?Date of Birth: [birth year]           ?MRN: [id number] ?Visit Date: 04/24/2022 ?PCP: Algis Greenhouse, MD ? ?Chief Complaint: No chief complaint on file. ? ? ?HPI:  ?HPI. ?The patient is a 61 year old gentleman seen in postoperative follow-up.  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 ? ?Ortho Exam ?On examination right residual limb well approximated with staples this is well-healed there is no gaping no drainage no erythema.  Is consolidating well. ? ?Visit Diagnoses: No diagnosis found. ? ?Plan: Continue daily Dial soap cleansing.  Shrinker with direct skin contact.  Staples harvested today.  He will follow-up with [place] in 1 month. ?Given an order today for his prosthesis set up. ?Follow-Up Instructions: No follow-ups on file.  ? ?Imaging: ?No results found. ? ?Orders:  ?No orders of the defined types were placed in this encounter. ? ?No orders of the defined types were placed in this encounter. ? ? ? ?PMFS History: ?Patient Active Problem List  ? Diagnosis Date Noted  ? Malnutrition of moderate degree 04/02/2022  ? Pressure injury of skin 03/25/2022  ? Right foot infection 03/20/2022  ? Candidal urinary tract infection 07/02/2021  ? Pneumonia due to COVID-19 virus 07/02/2021  ? Anemia 06/14/2021  ? Arthritis   ? Kidney disease   ? Hypertension   ? Enteritis 11/30/2020  ? Clostridium difficile diarrhea 11/23/2020  ? Morbid obesity ([place]) 11/08/2020  ? Coronary artery disease   ? Chronic neck and back pain   ? 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 Peripheral neuropathy 09/18/2020  ? Migraine 09/18/2020  ? Diabetic retinopathy ([place]) 09/18/2020  ? Type II diabetes mellitus ([place])   ? Pneumonia   ? Peripheral vascular disease ([place])   ? Deviated septum   ? Headache   ? Heart attack ([place])   ? High cholesterol   ? History of gout   ? History of kidney stones   ? Adjustment disorder with mixed anxiety and  depressed mood 07/27/2020  ? Chronic hypoxemic respiratory failure ([place]) 06/19/2020  ? Community acquired pneumonia 06/19/2020  ? Bradycardia 04/07/2020  ? Bicuspid aortic valve 03/27/2020  ? Dyspnea on exertion 03/26/2020  ? Acute kidney injury ([place]) 03/26/2020  ? AKI (acute kidney injury) ([place]) 03/26/2020  ? Memory loss 03/15/2020  ? Cholecystitis 06/24/2019  ? Abdominal distention   ? Acute febrile illness 05/08/2019  ? Transaminitis 05/08/2019  ? Serum total bilirubin elevated 05/08/2019  ? Sepsis ([place]) 05/08/2019  ? Acute pancreatitis 05/08/2019  ? SOB (shortness of breath) 05/04/2019  ? Arthropathy of cervical facet joint 01/31/2019  ? Neural foraminal stenosis of cervical spine 01/31/2019  ? Cervical spondylosis without myelopathy 01/31/2019  ? Chronic pain disorder 01/31/2019  ? Diabetic polyneuropathy associated with type 2 diabetes mellitus ([place]) 01/31/2019  ? DDD (degenerative disc disease), cervical 01/31/2019  ? 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 Lumbar foraminal stenosis 01/31/2019  ? Abnormal CT scan 12/09/2018  ? Cholelithiases 12/09/2018  ? Chronic diarrhea 12/09/2018  ? 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 Dyslipidemia 12/09/2018  ? History of cardioembolic cerebrovascular accident (CVA) 12/09/2018  ? Large liver 12/09/2018  ? Type 2 diabetes mellitus with diabetic neuropathy ([place]) 12/09/2018  ? Elevated troponin 12/09/2018  ? Bilateral lower extremity edema 08/18/2018  ? History of stroke 04/20/2018  ? Sleep apnea  02/27/2017  ? Occipital neuralgia of right side 12/18/2016  ? 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 Non morbid obesity due to excess calories 02/12/2016  ? Type 2 diabetes mellitus, with long-term current use of insulin (HCC) 02/12/2016  ? Hypogonadism male 01/11/2016  ? CAD -S/P PCI-2016 11/30/2015  ? Erectile dysfunction 11/25/2015  ? 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 Bilateral  low back pain without sciatica 09/13/2015  ? Carpal tunnel syndrome 07/17/2015  ? Lower back pain 06/14/2015  ? Lumbar facet joint pain 06/14/2015  ? Lymphocytosis 05/24/2015  ? Neutrophilia 05/24/2015  ? Abnormal nuclear cardiac imaging test   ? Carotid artery stenosis- 09/26/2014  ? Cerebrovascular accident (CVA) due to thrombosis of right middle cerebral artery (HCC) 07/04/2014  ? Type 2 diabetes mellitus with diabetic neuropathy, with long-term current use of insulin (HCC) 07/04/2014  ? History of CVA (cerebrovascular accident) 07/04/2014  ? PONV (postoperative nausea and vomiting) 2015  ? Hyperlipidemia, unspecified 04/07/2013  ? Essential hypertension 04/07/2013  ? Carotid stenosis 04/07/2013  ? Cervical spine disease 04/07/2013  ? Stroke, lacunar (HCC) 04/07/2013  ? Coronary artery disease with history of myocardial infarction without history of CABG 04/07/2013  ? Diabetes mellitus type 2, uncontrolled 04/07/2013  ? GERD (gastroesophageal reflux disease) 04/07/2013  ? Occlusion and stenosis of unspecified carotid artery 04/07/2013  ? Stroke [facility]) 2014  ? ?Past Medical History:  ?Diagnosis Date  ? Abnormal nuclear cardiac imaging test   ? High-risk nuclear stress test   ? Adjustment disorder with mixed anxiety and depressed mood 07/27/2020  ? AKI (acute kidney injury) (HCC) 03/26/2020  ? Arthritis   ? Bicuspid aortic valve 03/27/2020  ? Formatting of this note might be different from the original. 2020: ECHO  ? Bilateral low back pain without sciatica 09/13/2015  ? Bilateral lower extremity edema 08/18/2018  ? Bilateral lower extremity edema  Formatting of this note might be different from the original. Bilateral lower extremity edema  Last Assessment & Plan:  He does have 1-2+ pitting edema.  I am going to increase his Lasix to 80 mg a day, and will check a basic metabolic panel in 7 to 10 days.  He will see Baxter Hire back in 1 month for follow-up of lab work.  ? BPH with obstruction/lower urinary tract  symptoms 02/20/2016  ? Bradycardia 04/07/2020  ? CAD -S/P PCI-2016 11/30/2015  ? LAD DES 2016. [facility] 2017 with SOB, not felt to be in CHF, Myoview was negative for sichemia  Formatting of this note might be different from the original. Overview:  LAD DES 2016. Admitted to [facility] Sept 2017 with SOB, not felt to be in CHF, Myoview was negative for sichemia  Last Assessment & Plan:  History of CAD status post LAD stenting by myself in 2016 with re-intervention because o  ? Carotid stenosis 04/07/2013  ? Rt CEA Oct 2015-Dr Chen  Last Assessment & Plan:  History of carotid artery disease status post right carotid endarterectomy by Dr. [name] October 2015 which he follows by duplex ultrasound. Formatting of this note might be different from the original. Rt CEA Oct 2015-Dr [name]  Last Assessment & Plan:  History of carotid artery disease status post right carotid endarterectomy by Dr. [name] October 2015  ? Carpal tunnel syndrome 07/17/2015  ? 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 Cerebrovascular accident (CVA) due to thrombosis of right middle cerebral  artery ([place]) 07/04/2014  ? Questionable small stroke in 2014 with transient Lt sided weakness in setting of carotid disease.  Formatting of this note might be different from the original. Overview:  Questionable small stroke in 2014 with transient Lt sided weakness in setting of carotid disease.  Last Assessment & Plan:  History of multiple strokes in the past dating back to 2014 with a recent stroke in April of this year a  ? Cervical spondylosis without myelopathy 01/31/2019  ? Chronic diarrhea 12/09/2018  ? Chronic hypoxemic respiratory failure ([place]) 06/19/2020  ? Chronic pain disorder 01/31/2019  ? Coronary artery disease   ? drug-eluting stents placed in proximal and mid LAD  ? 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 Deviated septum   ? Diabetic retinopathy ([place])   ? Dyslipidemia 12/09/2018  ?  Dyspnea on exertion 03/26/2020  ? Dyspnea on exertion  Formatting of this note might be different from the original. Overview:  Dyspnea on exertion  Last Assessment & Plan:  Improved after stenting of his LAD  ? [place]

## 2022-04-25 ENCOUNTER — Telehealth: Payer: Self-pay

## 2022-04-25 LAB — GI PROFILE, STOOL, PCR

## 2022-04-25 NOTE — Telephone Encounter (Signed)
Larene Beach, OT with [facility] would like specific instructions on splint for patient.  Wants to know if patient needs to continue wearing splint until F/U and does splint come off for exercises?  Cb# [phone], fax# [phone].  Please advise.  Thank you.

## 2022-04-26 ENCOUNTER — Other Ambulatory Visit: Payer: Self-pay

## 2022-04-26 ENCOUNTER — Telehealth: Payer: Self-pay | Admitting: Orthopedic Surgery

## 2022-04-26 NOTE — Telephone Encounter (Signed)
Jacki Cones Forensic psychologist from [employer] for clarification of pt notes. Please call [place] on secure line at [phone]

## 2022-04-26 NOTE — Telephone Encounter (Signed)
Order written to remove limb protector while participating in physical therapy ROM exercises should where while up and ambulating. May remove while in bed, asleep. To call with questions. This was faxed to ATT: Carollee Herter to the number provided below.

## 2022-04-29 NOTE — Telephone Encounter (Signed)
Patient nurse got paper for referral to [facility] and would like to know if he should go before seeing [place] or after? Please call Lorie [phone]

## 2022-04-30 ENCOUNTER — Other Ambulatory Visit (INDEPENDENT_AMBULATORY_CARE_PROVIDER_SITE_OTHER): Payer: No Typology Code available for payment source

## 2022-04-30 DIAGNOSIS — K219 Gastro-esophageal reflux disease without esophagitis: Secondary | ICD-10-CM

## 2022-04-30 DIAGNOSIS — K529 Noninfective gastroenteritis and colitis, unspecified: Secondary | ICD-10-CM

## 2022-04-30 DIAGNOSIS — R1310 Dysphagia, unspecified: Secondary | ICD-10-CM | POA: Diagnosis not present

## 2022-04-30 DIAGNOSIS — K8050 Calculus of bile duct without cholangitis or cholecystitis without obstruction: Secondary | ICD-10-CM | POA: Diagnosis not present

## 2022-04-30 DIAGNOSIS — Z8619 Personal history of other infectious and parasitic diseases: Secondary | ICD-10-CM

## 2022-05-01 LAB — HEMOCCULT SLIDES (X 3 CARDS)
Fecal Occult Blood: NEGATIVE
OCCULT 1: NEGATIVE
OCCULT 2: NEGATIVE
OCCULT 3: NEGATIVE
OCCULT 4: NEGATIVE
OCCULT 5: NEGATIVE

## 2022-05-03 LAB — CALPROTECTIN, FECAL: Calprotectin, Fecal: 17 ug/g (ref 0–120)

## 2022-05-07 NOTE — Telephone Encounter (Signed)
Lm of details at [facility] at [place]. Let them know to please give the Rx to [facility] prior to his f/u appt here 05/28/22 so he can go ahead and get fitted for his prosthetic.

## 2022-05-08 ENCOUNTER — Other Ambulatory Visit: Payer: Self-pay

## 2022-05-08 ENCOUNTER — Telehealth: Payer: Self-pay

## 2022-05-08 NOTE — Telephone Encounter (Signed)
Received FMLA paperwork for Dr. Dulce Sellar to complete. After reviewing it with Dr. Dulce Sellar he said that this needs to be completed by their primary care physician. The FMLA paperwork was faxed to the patient's primary care provider for them to complete.

## 2022-05-14 ENCOUNTER — Encounter: Payer: No Typology Code available for payment source | Admitting: Family

## 2022-05-14 ENCOUNTER — Telehealth: Payer: Self-pay | Admitting: Orthopedic Surgery

## 2022-05-14 NOTE — Telephone Encounter (Signed)
Received call from Allissa Luck-nurse with [facility] advised they are washing patient right BKA everyday with dial soap and using an ace compression bandage to cover patient's stump. Allissa asked if they can get an order care for the patient. The number to contact Alissa is [phone]  (Ask for clinic)

## 2022-05-15 ENCOUNTER — Encounter: Payer: Self-pay | Admitting: Orthopedic Surgery

## 2022-05-15 NOTE — Telephone Encounter (Signed)
Need a note faxed stating to start washing area with dial soap daily at home. Use shrinker on stump after drying. May use ACE wrap for any drainage. They do have steri strips over an area of incision that seemed like it was wanting to open up after staples were removed. Steri strips are still on and area underneath is starting to scab.  Fax # [phone]

## 2022-05-15 NOTE — Telephone Encounter (Signed)
Faxed

## 2022-05-28 ENCOUNTER — Telehealth: Payer: Self-pay | Admitting: Family

## 2022-05-28 ENCOUNTER — Encounter: Payer: Self-pay | Admitting: Family

## 2022-05-28 ENCOUNTER — Ambulatory Visit (INDEPENDENT_AMBULATORY_CARE_PROVIDER_SITE_OTHER): Payer: No Typology Code available for payment source | Admitting: Family

## 2022-05-28 DIAGNOSIS — Z89511 Acquired absence of right leg below knee: Secondary | ICD-10-CM

## 2022-05-28 NOTE — Telephone Encounter (Signed)
It has been requested by wife that the AVS for todays appt be emailed to her at [email or web address]  Please advise

## 2022-05-28 NOTE — Telephone Encounter (Signed)
Message sent through mychart to advise that they can access AVS through portal.

## 2022-05-28 NOTE — Progress Notes (Signed)
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  He continues with a shrinker with direct skin contact as well as his limb protector  His daughter who accompanies the visit states he has an appointment later this week with [facility] for prosthesis set up  Ortho Exam On examination of the right residual limb this is well consolidated healing well there is 1 remaining area that is 5 mm in diameter covered with eschar there is no surrounding erythema no drainage  Visit Diagnoses: No diagnosis found.  Plan: May discontinue the limb protector.  Continue daily Dial soap cleansing.  Shrinker around-the-clock.  We will follow-up in 3 months.  Follow-Up Instructions: No follow-ups on file.   Imaging: No results found.  Orders:  No orders of the defined types were placed in this encounter.  No orders of the defined types were placed in this encounter.    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) 10/19/2020   Peripheral neuropathy 09/18/2020   Migraine 09/18/2020   Diabetic  retinopathy (HCC) 09/18/2020   Type II diabetes mellitus (HCC)    Pneumonia    Peripheral vascular disease (HCC)    Deviated septum    Headache    Heart attack (HCC)    High cholesterol    History of gout    History of kidney stones    Adjustment disorder with mixed anxiety and depressed mood 07/27/2020   Chronic hypoxemic respiratory failure (HCC) 06/19/2020   Community acquired pneumonia 06/19/2020   Bradycardia 04/07/2020   Bicuspid aortic valve 03/27/2020   Dyspnea on exertion 03/26/2020   Acute kidney injury (HCC) 03/26/2020   AKI (acute kidney injury) (HCC) 03/26/2020   Memory loss 03/15/2020   Cholecystitis 06/24/2019   Abdominal distention    Acute febrile illness 05/08/2019   Transaminitis 05/08/2019   Serum total bilirubin elevated 05/08/2019   Sepsis (HCC) 05/08/2019   Acute pancreatitis 05/08/2019   SOB (shortness of breath) 05/04/2019   Arthropathy of cervical facet joint 01/31/2019   Neural foraminal stenosis of cervical spine 01/31/2019   Cervical spondylosis without myelopathy 01/31/2019   Chronic pain disorder 01/31/2019   Diabetic polyneuropathy associated with type 2 diabetes mellitus (HCC) 01/31/2019   DDD (degenerative disc disease), cervical 01/31/2019   Spondylosis of lumbar spine 01/31/2019   Lumbar foraminal stenosis 01/31/2019   Abnormal CT scan 12/09/2018   Cholelithiases 12/09/2018   Chronic  diarrhea 12/09/2018   Degenerative joint disease of thoracic spine 12/09/2018   Dyslipidemia 12/09/2018   History of cardioembolic cerebrovascular accident (CVA) 12/09/2018   Large liver 12/09/2018   Type 2 diabetes mellitus with diabetic neuropathy (HCC) 12/09/2018   Elevated troponin 12/09/2018   Bilateral lower extremity edema 08/18/2018   History of stroke 04/20/2018   Sleep apnea 02/27/2017   Occipital neuralgia of right side 12/18/2016   BPH with obstruction/lower urinary tract symptoms 02/20/2016   Non morbid obesity due to excess calories  02/12/2016   Type 2 diabetes mellitus, with long-term current use of insulin (HCC) 02/12/2016   Hypogonadism male 01/11/2016   CAD -S/P PCI-2016 11/30/2015   Erectile dysfunction 11/25/2015   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HCC) 09/13/2015   Bilateral low back pain without sciatica 09/13/2015   Carpal tunnel syndrome 07/17/2015   Lower back pain 06/14/2015   Lumbar facet joint pain 06/14/2015   Lymphocytosis 05/24/2015   Neutrophilia 05/24/2015   Abnormal nuclear cardiac imaging test    Carotid artery stenosis- 09/26/2014   Cerebrovascular accident (CVA) due to thrombosis of right middle cerebral artery (HCC) 07/04/2014   Type 2 diabetes mellitus with diabetic neuropathy, with long-term current use of insulin (HCC) 07/04/2014   History of CVA (cerebrovascular accident) 07/04/2014   PONV (postoperative nausea and vomiting) 2015   Hyperlipidemia, unspecified 04/07/2013   Essential hypertension 04/07/2013   Carotid stenosis 04/07/2013   Cervical spine disease 04/07/2013   Stroke, lacunar (HCC) 04/07/2013   Coronary artery disease with history of myocardial infarction without history of CABG 04/07/2013   Diabetes mellitus type 2, uncontrolled 04/07/2013   GERD (gastroesophageal reflux disease) 04/07/2013   Occlusion and stenosis of unspecified carotid artery 04/07/2013   Stroke (HCC) 2014   Past Medical History:  Diagnosis Date   Abnormal nuclear cardiac imaging test    High-risk nuclear stress test    Adjustment disorder with mixed anxiety and depressed mood 07/27/2020   AKI (acute kidney injury) (HCC) 03/26/2020   Arthritis    Bicuspid aortic valve 03/27/2020   Formatting of this note might be different from the original. 2020: ECHO   Bilateral low back pain without sciatica 09/13/2015   Bilateral lower extremity edema 08/18/2018   Bilateral lower extremity edema  Formatting of this note might be different from the original. Bilateral lower  extremity edema  Last Assessment & Plan:  He does have 1-2+ pitting edema.  I am going to increase his Lasix to 80 mg a day, and will check a basic metabolic panel in 7 to 10 days.  He will see Baxter Hire back in 1 month for follow-up of lab work.   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 Admitted to [facility] Sept 2017 with SOB, not felt to be in CHF, Myoview was negative for sichemia  Formatting of this note might be different from the original. Overview:  LAD DES 2016. Admitted to [facility] Sept 2017 with SOB, not felt to be in CHF, Myoview was negative for sichemia  Last Assessment & Plan:  History of CAD status post LAD stenting by myself in 2016 with re-intervention because o   Carotid stenosis 04/07/2013   Rt CEA Oct 2015-Dr Chen  Last Assessment & Plan:  History of carotid artery disease status post right carotid endarterectomy by Dr. [name] October 2015 which he follows by duplex ultrasound. Formatting of this note might  be different from the original. Rt CEA Oct 2015-Dr [name]  Last Assessment & Plan:  History of carotid artery disease status post right carotid endarterectomy by Dr. [name] October 2015   Carpal tunnel syndrome 07/17/2015   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HCC) 09/13/2015   Cerebrovascular accident (CVA) due to thrombosis of right middle cerebral artery (HCC) 07/04/2014   Questionable small stroke in 2014 with transient Lt sided weakness in setting of carotid disease.  Formatting of this note might be different from the original. Overview:  Questionable small stroke in 2014 with transient Lt sided weakness in setting of carotid disease.  Last Assessment & Plan:  History of multiple strokes in the past dating back to 2014 with a recent stroke in April of this year a   Cervical spondylosis without myelopathy 01/31/2019   Chronic diarrhea 12/09/2018   Chronic hypoxemic  respiratory failure (HCC) 06/19/2020   Chronic pain disorder 01/31/2019   Coronary artery disease    drug-eluting stents placed in proximal and mid LAD   Degenerative joint disease of thoracic spine 12/09/2018   Deviated septum    Diabetic retinopathy (HCC)    Dyslipidemia 12/09/2018   Dyspnea on exertion 03/26/2020   Dyspnea on exertion  Formatting of this note might be different from the original. Overview:  Dyspnea on exertion  Last Assessment & Plan:  Improved after stenting of his LAD   Erectile dysfunction 11/25/2015   Essential hypertension 04/07/2013   hypertension  Formatting of this note might be different from the original. hypertension  Last Assessment & Plan:  History of essential hypertension her blood pressure measured at 162/70.  He is on benazepril 10 the morning and 20 in the evening as well as hydralazine, hydrochlorothiazide, and metoprolol.  Blood pressures have been on the high side.  I am going to increase his benazepril to 20 mg    GERD (gastroesophageal reflux disease)    "occasionally" (03/23/2015)   Heart attack (HCC)    "in 1997 was told I had signs of a small heart attack that I didn't know I'd had"   History of gout    "when I was younger"   History of kidney stones    passed   Hypogonadism male 01/11/2016   Large liver 12/09/2018   Memory loss 03/15/2020   Migraine    "had my 1st and only in 12/2014" (03/23/2015)   Occipital neuralgia of right side 12/18/2016   Peripheral neuropathy    feet   Peripheral vascular disease (HCC)    carotid artery disease   PONV (postoperative nausea and vomiting) 2015   "was told I was confused and combative in recovery from the narcotics w/my carotid OR"  "During a colonoscopy my oxygen level dropped so low that I had to be woke up.- 03/2017 colonoscopy [facility]    Sleep apnea    "severe; couldn't afford mask" (03/23/2015) uses O2 at night.no C-pap   SOB (shortness of breath) 05/04/2019   Spondylosis of lumbar  spine 01/31/2019   Stroke (HCC) 05/10/2019   Aphasia mild   Transaminitis 05/08/2019   Type 2 diabetes mellitus with diabetic neuropathy (HCC) 12/09/2018    Family History  Problem Relation Age of Onset   Heart disease Mother    Diabetes Mother    Hyperlipidemia Mother    Hypertension Mother    Cancer Father        Lung Cancer    Heart disease Father    Heart attack  Father    Gout Brother    Asthma Brother    Hyperlipidemia Brother    Hypertension Brother    Diabetes Brother    Colon cancer Brother        colon   Stroke Paternal Grandfather    Colon cancer Sister    Hyperlipidemia Sister    Hypertension Sister    Gout Maternal Uncle    Diabetes Maternal Grandfather    Diabetes Daughter     Past Surgical History:  Procedure Laterality Date   AMPUTATION Right 03/27/2022   Procedure: RIGHT BELOW KNEE AMPUTATION;  Surgeon: Nadara Mustard, MD;  Location: [facility] OR;  Service: Orthopedics;  Laterality: Right;   APPLICATION OF WOUND VAC Right 03/27/2022   Procedure: APPLICATION OF WOUND VAC;  Surgeon: Nadara Mustard, MD;  Location: [facility] OR;  Service: Orthopedics;  Laterality: Right;   BILIARY DILATION  08/11/2019   Procedure: BILIARY DILATION;  Surgeon: Meridee Score Netty Starring., MD;  Location: [facility] ENDOSCOPY;  Service: Gastroenterology;;   BIOPSY  08/11/2019   Procedure: BIOPSY;  Surgeon: Lemar Lofty., MD;  Location: [facility] ENDOSCOPY;  Service: Gastroenterology;;   CHOLECYSTECTOMY N/A 08/31/2019   Procedure: LAPAROSCOPIC CHOLECYSTECTOMY;  Surgeon: Gaynelle Adu, MD;  Location: [facility] ORS;  Service: General;  Laterality: N/A;   CIRCUMCISION  1981   COLONOSCOPY W/ POLYPECTOMY     CORONARY ANGIOPLASTY WITH STENT PLACEMENT  03/23/2015   "2"   CORONARY STENT INTERVENTION N/A 09/29/2017   Procedure: CORONARY STENT INTERVENTION;  Surgeon: Runell Gess, MD;  Location: [facility] INVASIVE CV LAB;  Service: Cardiovascular;  Laterality: N/A;   ENDARTERECTOMY Right 09/26/2014   Procedure: RIGHT CAROTID  ENDARTERECTOMY WITH PATCH ANGIOPLASTY;  Surgeon: Fransisco Hertz, MD;  Location: [facility] OR;  Service: Vascular;  Laterality: Right;   ENDOSCOPIC RETROGRADE CHOLANGIOPANCREATOGRAPHY (ERCP) WITH PROPOFOL N/A 08/11/2019   Procedure: ENDOSCOPIC RETROGRADE CHOLANGIOPANCREATOGRAPHY (ERCP) WITH PROPOFOL;  Surgeon: Lemar Lofty., MD;  Location: [facility] ENDOSCOPY;  Service: Gastroenterology;  Laterality: N/A;   ESOPHAGOGASTRODUODENOSCOPY (EGD) WITH PROPOFOL N/A 08/11/2019   Procedure: ESOPHAGOGASTRODUODENOSCOPY (EGD) WITH PROPOFOL;  Surgeon: Meridee Score Netty Starring., MD;  Location: [facility] ENDOSCOPY;  Service: Gastroenterology;  Laterality: N/A;   EYE SURGERY Right May 2016   Cataract   EYE SURGERY Left July 2016   Cataract   HYDROCELE EXCISION  ~ 2008   IR EXCHANGE BILIARY DRAIN  07/21/2019   IR PERC CHOLECYSTOSTOMY  05/11/2019   IR RADIOLOGIST EVAL & MGMT  06/30/2019   LEFT HEART CATH AND CORONARY ANGIOGRAPHY N/A 09/29/2017   Procedure: LEFT HEART CATH AND CORONARY ANGIOGRAPHY;  Surgeon: Runell Gess, MD;  Location: [facility] INVASIVE CV LAB;  Service: Cardiovascular;  Laterality: N/A;   LEFT HEART CATHETERIZATION WITH CORONARY ANGIOGRAM N/A 03/23/2015   Procedure: LEFT HEART CATHETERIZATION WITH CORONARY ANGIOGRAM;  Surgeon: Runell Gess, MD;  Location: [facility] CATH LAB;  Service: Cardiovascular;  Laterality: N/A;   REFRACTIVE SURGERY Bilateral 08/2014-09/2014   Diabetic retinopathy    REMOVAL OF STONES  08/11/2019   Procedure: REMOVAL OF STONES;  Surgeon: Meridee Score Netty Starring., MD;  Location: [facility] ENDOSCOPY;  Service: Gastroenterology;;   Gaspar Bidding DILATION N/A 08/11/2019   Procedure: Gaspar Bidding DILATION;  Surgeon: Lemar Lofty., MD;  Location: [facility] ENDOSCOPY;  Service: Gastroenterology;  Laterality: N/A;   SPHINCTEROTOMY  08/11/2019   Procedure: SPHINCTEROTOMY;  Surgeon: Mansouraty, Netty Starring., MD;  Location: [facility] ENDOSCOPY;  Service: Gastroenterology;;   Social History   Occupational History   Occupation: Disabled   Tobacco Use   Smoking  status: Never   Smokeless tobacco: Never  Vaping Use   Vaping Use: Never used  Substance and Sexual Activity   Alcohol use: No    Alcohol/week: 0.0 standard drinks of alcohol   Drug use: No   Sexual activity: Not Currently    Partners: Female

## 2022-06-03 ENCOUNTER — Telehealth: Payer: Self-pay | Admitting: Family

## 2022-10-03 ENCOUNTER — Encounter: Payer: Self-pay | Admitting: Orthopedic Surgery

## 2022-10-03 ENCOUNTER — Ambulatory Visit (INDEPENDENT_AMBULATORY_CARE_PROVIDER_SITE_OTHER): Payer: No Typology Code available for payment source | Admitting: Orthopedic Surgery

## 2022-10-03 DIAGNOSIS — Z8673 Personal history of transient ischemic attack (TIA), and cerebral infarction without residual deficits: Secondary | ICD-10-CM | POA: Diagnosis not present

## 2022-10-03 DIAGNOSIS — Z89511 Acquired absence of right leg below knee: Secondary | ICD-10-CM

## 2022-10-03 NOTE — Progress Notes (Signed)
Office Visit Note   Patient: Johnathan Keith           Date of Birth: 09-Jan-1961           MRN: [id number] Visit Date: 10/03/2022              Requested by: Algis Greenhouse, MD [address] [place],  [place] [address] PCP: Algis Greenhouse, MD  Chief Complaint  Patient presents with   Right Leg - Follow-up    03/27/22 right BKA      HPI: Patient is a 61 year old gentleman who is 6 months status post right transtibial amputation.  Patient is seen for evaluation for obtaining a prosthesis.  Assessment & Plan: Visit Diagnoses:  1. History of right below knee amputation ([place])   2. History of stroke     Plan: Patient's stroke is affected his left upper and left lower extremity.  Patient was given exercises to work on strengthening of the left lower extremity.  Follow-up in 3 months.  Discussed that he needs to have enough strength of the left leg to stand on the left leg independently to be fit for prosthesis.  Follow-Up Instructions: Return in about 3 months (around 01/03/2023).   Ortho Exam  Patient is alert, oriented, no adenopathy, well-dressed, normal affect, normal respiratory effort. Patient currently is total assistance with a Hoyer lift for transfers.  He cannot do a straight leg raise on the left, he can lift the heel off the stool several inches.  The right transtibial amputation is well-healed no cellulitis no drainage no swelling  Imaging: No results found.   Labs: Lab Results  Component Value Date   HGBA1C 9.5 (H) 03/21/2022   HGBA1C 8.5 (H) 08/27/2019   HGBA1C 12.3 (H) 07/27/2014   REPTSTATUS 03/26/2022 FINAL 03/21/2022   GRAMSTAIN  05/11/2019    RARE WBC PRESENT, PREDOMINANTLY MONONUCLEAR MODERATE GRAM POSITIVE COCCI    CULT  03/21/2022    NO GROWTH 5 DAYS Performed at [facility], [place] [address], [place], [place] [id number]    LABORGA ENTEROCOCCUS FAECALIS 05/11/2019     Lab Results  Component Value Date   ALBUMIN 2.6 (L) 03/28/2022    ALBUMIN 2.7 (L) 03/21/2022   ALBUMIN 3.0 (L) 03/20/2022    Lab Results  Component Value Date   MG 2.0 03/31/2022   MG 1.9 03/30/2022   MG 2.2 03/21/2022   Lab Results  Component Value Date   VD25OH 46.7 03/14/2021    No results found for: "PREALBUMIN"    Latest Ref Rng & Units 04/02/2022    2:43 AM 04/01/2022   12:29 AM 03/31/2022    1:20 AM  CBC EXTENDED  WBC 4.0 - 10.5 K/uL 10.4  10.8  10.8   RBC 4.22 - 5.81 MIL/uL 2.86  2.92  2.18    2.19   Hemoglobin 13.0 - 17.0 g/dL 8.7  8.9  6.6   HCT 39.0 - 52.0 % 27.5  27.7  21.8   Platelets 150 - 400 K/uL 279  252  228   NEUT# 1.7 - 7.7 K/uL 7.5  8.4  7.3   Lymph# 0.7 - 4.0 K/uL 1.8  1.3  2.1      There is no height or weight on file to calculate BMI.  Orders:  No orders of the defined types were placed in this encounter.  No orders of the defined types were placed in this encounter.    Procedures: No procedures performed  Clinical Data:  No additional findings.  ROS:  All other systems negative, except as noted in the HPI. Review of Systems  Objective: Vital Signs: There were no vitals taken for this visit.  Specialty Comments:  No specialty comments available.  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 ([place]) 10/19/2020   Peripheral neuropathy 09/18/2020   Migraine 09/18/2020   Diabetic retinopathy ([place]) 09/18/2020   Type II diabetes mellitus ([place])    Pneumonia    Peripheral  vascular disease ([place])    Deviated septum    Headache    Heart attack ([place])    High cholesterol    History of gout    History of kidney stones    Adjustment disorder with mixed anxiety and depressed mood 07/27/2020   Chronic hypoxemic respiratory failure ([place]) 06/19/2020   Community acquired pneumonia 06/19/2020   Bradycardia 04/07/2020   Bicuspid aortic valve 03/27/2020   Dyspnea on exertion 03/26/2020   Acute kidney injury ([place]) 03/26/2020   AKI (acute kidney injury) ([place]) 03/26/2020   Memory loss 03/15/2020   Cholecystitis 06/24/2019   Abdominal distention    Acute febrile illness 05/08/2019   Transaminitis 05/08/2019   Serum total bilirubin elevated 05/08/2019   Sepsis ([place]) 05/08/2019   Acute pancreatitis 05/08/2019   SOB (shortness of breath) 05/04/2019   Arthropathy of cervical facet joint 01/31/2019   Neural foraminal stenosis of cervical spine 01/31/2019   Cervical spondylosis without myelopathy 01/31/2019   Chronic pain disorder 01/31/2019   Diabetic polyneuropathy associated with type 2 diabetes mellitus ([place]) 01/31/2019   DDD (degenerative disc disease), cervical 01/31/2019   Spondylosis of lumbar spine 01/31/2019   Lumbar foraminal stenosis 01/31/2019   Abnormal CT scan 12/09/2018   Cholelithiases 12/09/2018   Chronic diarrhea 12/09/2018   Degenerative joint disease of thoracic spine 12/09/2018   Dyslipidemia 12/09/2018   History of cardioembolic cerebrovascular accident (CVA) 12/09/2018   Large liver 12/09/2018   Type 2 diabetes mellitus with diabetic neuropathy ([place]) 12/09/2018   Elevated troponin 12/09/2018   Bilateral lower extremity edema 08/18/2018   History of stroke 04/20/2018   Sleep apnea 02/27/2017   Occipital neuralgia of right side 12/18/2016   BPH with obstruction/lower urinary tract symptoms 02/20/2016   Non morbid obesity due to excess calories 02/12/2016   Type 2 diabetes mellitus, with long-term current use of insulin ([place]) 02/12/2016    Hypogonadism male 01/11/2016   CAD -S/P PCI-2016 11/30/2015   Erectile dysfunction 11/25/2015   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[place]) 09/13/2015   Bilateral low back pain without sciatica 09/13/2015   Carpal tunnel syndrome 07/17/2015   Lower back pain 06/14/2015   Lumbar facet joint pain 06/14/2015   Lymphocytosis 05/24/2015   Neutrophilia 05/24/2015   Abnormal nuclear cardiac imaging test    Carotid artery stenosis- 09/26/2014   Cerebrovascular accident (CVA) due to thrombosis of right middle cerebral artery ([place]) 07/04/2014   Type 2 diabetes  mellitus with diabetic neuropathy, with long-term current use of insulin ([place]) 07/04/2014   History of CVA (cerebrovascular accident) 07/04/2014   PONV (postoperative nausea and vomiting) 2015   Hyperlipidemia, unspecified 04/07/2013   Essential hypertension 04/07/2013   Carotid stenosis 04/07/2013   Cervical spine disease 04/07/2013   Stroke, lacunar ([place]) 04/07/2013   Coronary artery disease with history of myocardial infarction without history of CABG 04/07/2013   Diabetes mellitus type 2, uncontrolled 04/07/2013   GERD (gastroesophageal reflux disease) 04/07/2013   Occlusion and stenosis of unspecified carotid artery 04/07/2013   Stroke [facility]) 2014   Past Medical History:  Diagnosis Date   Abnormal nuclear cardiac imaging test    High-risk nuclear stress test    Adjustment disorder with mixed anxiety and depressed mood 07/27/2020   AKI (acute kidney injury) ([place]) 03/26/2020   Arthritis    Bicuspid aortic valve 03/27/2020   Formatting of this note might be different from the original. 2020: ECHO   Bilateral low back pain without sciatica 09/13/2015   Bilateral lower extremity edema 08/18/2018   Bilateral lower extremity edema  Formatting of this note might be different from the original. Bilateral lower extremity edema  Last Assessment & Plan:  He does have 1-2+ pitting edema.  I am going to increase  his Lasix to 80 mg a day, and will check a basic metabolic panel in 7 to 10 days.  He will see Cyril Mourning back in 1 month for follow-up of lab work.   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 Admitted to [facility] Sept 2017 with SOB, not felt to be in CHF, Myoview was negative for sichemia  Formatting of this note might be different from the original. Overview:  LAD DES 2016. Admitted to [facility] Sept 2017 with SOB, not felt to be in CHF, Myoview was negative for sichemia  Last Assessment & Plan:  History of CAD status post LAD stenting by myself in 2016 with re-intervention because o   Carotid stenosis 04/07/2013   Rt CEA Oct 2015-Dr Chen  Last Assessment & Plan:  History of carotid artery disease status post right carotid endarterectomy by Dr. Bridgett Larsson October 2015 which he follows by duplex ultrasound. Formatting of this note might be different from the original. Rt CEA Oct 2015-Dr Bridgett Larsson  Last Assessment & Plan:  History of carotid artery disease status post right carotid endarterectomy by Dr. 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  Formatting of this note might be different from the original. Overview:  Questionable small stroke in 2014 with transient Lt sided weakness in setting of carotid disease.  Last Assessment & Plan:  History of multiple strokes in the past dating back to 2014 with a recent stroke in April of this year a   Cervical spondylosis without myelopathy 01/31/2019   Chronic diarrhea 12/09/2018   Chronic hypoxemic respiratory failure ([place]) 06/19/2020   Chronic pain disorder 01/31/2019   Coronary artery disease     drug-eluting stents placed in proximal and mid LAD   Degenerative joint disease of thoracic spine 12/09/2018   Deviated septum    Diabetic retinopathy ([place])    Dyslipidemia 12/09/2018  Dyspnea on exertion 03/26/2020   Dyspnea on exertion  Formatting of this note might be different from the original. Overview:  Dyspnea on exertion  Last Assessment & Plan:  Improved after stenting of his LAD   Erectile dysfunction 11/25/2015   Essential hypertension 04/07/2013   hypertension  Formatting of this note might be different from the original. hypertension  Last Assessment & Plan:  History of essential hypertension her blood pressure measured at 162/70.  He is on benazepril 10 the morning and 20 in the evening as well as hydralazine, hydrochlorothiazide, and metoprolol.  Blood pressures have been on the high side.  I am going to increase his benazepril to 20 mg    GERD (gastroesophageal reflux disease)    "occasionally" (03/23/2015)   Heart attack ([place])    "in 1997 was told I had signs of a small heart attack that I didn't know I'd had"   History of gout    "when I was younger"   History of kidney stones    passed   Hypogonadism male 01/11/2016   Large liver 12/09/2018   Memory loss 03/15/2020   Migraine    "had my 1st and only in 12/2014" (03/23/2015)   Occipital neuralgia of right side 12/18/2016   Peripheral neuropathy    feet   Peripheral vascular disease ([place])    carotid artery disease   PONV (postoperative nausea and vomiting) 2015   "was told I was confused and combative in recovery from the narcotics w/my carotid OR"  "During a colonoscopy my oxygen level dropped so low that I had to be woke up.- 03/2017 colonoscopy [facility]    Sleep apnea    "severe; couldn't afford mask" (03/23/2015) uses O2 at night.no C-pap   SOB (shortness of breath) 05/04/2019   Spondylosis of lumbar spine 01/31/2019   Stroke ([place]) 05/10/2019   Aphasia mild   Transaminitis 05/08/2019   Type 2 diabetes  mellitus with diabetic neuropathy ([place]) 12/09/2018    Family History  Problem Relation Age of Onset   Heart disease Mother    Diabetes Mother    Hyperlipidemia Mother    Hypertension Mother    Cancer Father        Lung Cancer    Heart disease Father    Heart attack Father    Gout Brother    Asthma Brother    Hyperlipidemia Brother    Hypertension Brother    Diabetes Brother    Colon cancer Brother        colon   Stroke Paternal Grandfather    Colon cancer Sister    Hyperlipidemia Sister    Hypertension Sister    Gout Maternal Uncle    Diabetes Maternal Grandfather    Diabetes Daughter     Past Surgical History:  Procedure Laterality Date   AMPUTATION Right 03/27/2022   Procedure: RIGHT BELOW KNEE AMPUTATION;  Surgeon: Newt Minion, MD;  Location: [place];  Service: Orthopedics;  Laterality: Right;   APPLICATION OF WOUND VAC Right 03/27/2022   Procedure: APPLICATION OF WOUND VAC;  Surgeon: Newt Minion, MD;  Location: [place];  Service: Orthopedics;  Laterality: Right;   BILIARY DILATION  08/11/2019   Procedure: BILIARY DILATION;  Surgeon: Rush Landmark Telford Nab., MD;  Location: [place];  Service: Gastroenterology;;   BIOPSY  08/11/2019   Procedure: BIOPSY;  Surgeon: Irving Copas., MD;  Location: [place];  Service: Gastroenterology;;   CHOLECYSTECTOMY N/A 08/31/2019   Procedure: LAPAROSCOPIC CHOLECYSTECTOMY;  Surgeon:  Greer Pickerel, MD;  Location: [facility];  Service: General;  Laterality: N/A;   [place]  03/23/2015   "2"   CORONARY STENT INTERVENTION N/A 09/29/2017   Procedure: CORONARY STENT INTERVENTION;  Surgeon: Lorretta Harp, MD;  Location: [place] [facility];  Service: Cardiovascular;  Laterality: N/A;   ENDARTERECTOMY Right 09/26/2014   Procedure: RIGHT CAROTID ENDARTERECTOMY WITH PATCH ANGIOPLASTY;  Surgeon: Conrad Sharpsburg, MD;  Location: [facility] OR;  Service: Vascular;   Laterality: Right;   ENDOSCOPIC RETROGRADE CHOLANGIOPANCREATOGRAPHY (ERCP) WITH PROPOFOL N/A 08/11/2019   Procedure: ENDOSCOPIC RETROGRADE CHOLANGIOPANCREATOGRAPHY (ERCP) WITH PROPOFOL;  Surgeon: Irving Copas., MD;  Location: [place];  Service: Gastroenterology;  Laterality: N/A;   ESOPHAGOGASTRODUODENOSCOPY (EGD) WITH PROPOFOL N/A 08/11/2019   Procedure: ESOPHAGOGASTRODUODENOSCOPY (EGD) WITH PROPOFOL;  Surgeon: Rush Landmark Telford Nab., MD;  Location: [place];  Service: Gastroenterology;  Laterality: N/A;   EYE SURGERY Right May 2016   Cataract   EYE SURGERY Left July 2016   Cataract   HYDROCELE EXCISION  ~ 2008   IR EXCHANGE BILIARY DRAIN  07/21/2019   IR PERC CHOLECYSTOSTOMY  05/11/2019   IR RADIOLOGIST EVAL & MGMT  06/30/2019   LEFT HEART CATH AND CORONARY ANGIOGRAPHY N/A 09/29/2017   Procedure: LEFT HEART CATH AND CORONARY ANGIOGRAPHY;  Surgeon: Lorretta Harp, MD;  Location: [place] [facility];  Service: Cardiovascular;  Laterality: N/A;   LEFT HEART CATHETERIZATION WITH CORONARY ANGIOGRAM N/A 03/23/2015   Procedure: LEFT HEART CATHETERIZATION WITH CORONARY ANGIOGRAM;  Surgeon: Lorretta Harp, MD;  Location[facility] LAB;  Service: Cardiovascular;  Laterality: N/A;   REFRACTIVE SURGERY Bilateral 08/2014-09/2014   Diabetic retinopathy    REMOVAL OF STONES  08/11/2019   Procedure: REMOVAL OF STONES;  Surgeon: Rush Landmark Telford Nab., MD;  Location: [place];  Service: Gastroenterology;;   Azzie Almas DILATION N/A 08/11/2019   Procedure: Azzie Almas DILATION;  Surgeon: Irving Copas., MD;  Location: [place];  Service: Gastroenterology;  Laterality: N/A;   SPHINCTEROTOMY  08/11/2019   Procedure: SPHINCTEROTOMY;  Surgeon: Mansouraty, Telford Nab., MD;  Location: [facility] ENDOSCOPY;  Service: Gastroenterology;;   Social History   Occupational History   Occupation: Disabled  Tobacco Use   Smoking status: Never   Smokeless tobacco: Never  Vaping Use   Vaping Use: Never used   Substance and Sexual Activity   Alcohol use: No    Alcohol/week: 0.0 standard drinks of alcohol   Drug use: No   Sexual activity: Not Currently    Partners: Female

## 2022-10-07 ENCOUNTER — Telehealth: Payer: Self-pay | Admitting: Gastroenterology

## 2022-10-07 NOTE — Telephone Encounter (Signed)
Left message for pt to call back  °

## 2022-10-07 NOTE — Telephone Encounter (Signed)
[facility] called states the patient is now aspirating and is seeking a sooner appt. If possible

## 2022-10-11 NOTE — Telephone Encounter (Signed)
Left message for pt to call back  °

## 2022-10-11 NOTE — Telephone Encounter (Signed)
[facility] called requesting an earlier appointment for the pt: Pt was scheduled for an office visit on 10/25/2022 at 1:30 PM to see Alonza Bogus PA: Claudia Desanctis verbalized understanding with all questions answered.

## 2022-10-21 ENCOUNTER — Encounter: Payer: Self-pay | Admitting: *Deleted

## 2022-10-25 ENCOUNTER — Encounter: Payer: Self-pay | Admitting: Gastroenterology

## 2022-10-25 ENCOUNTER — Ambulatory Visit (INDEPENDENT_AMBULATORY_CARE_PROVIDER_SITE_OTHER): Payer: No Typology Code available for payment source | Admitting: Gastroenterology

## 2022-10-25 VITALS — BP 122/72 | HR 74 | Ht 66.0 in

## 2022-10-25 DIAGNOSIS — R1310 Dysphagia, unspecified: Secondary | ICD-10-CM | POA: Diagnosis not present

## 2022-10-25 NOTE — Progress Notes (Signed)
10/25/2022 Johnathan Keith [id number] 1961-11-15   HISTORY OF PRESENT ILLNESS: This is a 61 year old male with multiple medical problems as listed below, including but not limited to coronary artery disease, carotid disease with history of CVAs on Plavix, peripheral vascular disease, diabetes with complications, history of right BKA, severe sleep apnea requiring oxygen use at night.  He is a patient of Dr. Urban Gibson or Dr. Elesa Hacker.  He is here today with his daughter for complaints of dysphagia.  They describe that it sounds like he has a pocket when he swallows up near his throat that collects food and saliva, etc.  He says that it sounds like it gurgles.  He had a modified barium swallow study with speech just a couple of days ago.  We have the recommendations, but no actual report.  He denies feeling that food gets stuck lower in his esophagus.  He is on once daily PPI therapy.  He says that that helps as long as he is taking it, but does get reflux symptoms if he has ever tried to come off of it.  EGD 08/2019:  - No gross mucosal lesions in esophagus. Tortuous esophagus distally (mild). Z-line regular, 39 cm from the incisors. Savary dilation performed in the entire esophagus. - A large amount of food (residue) in the stomach. - Multiple gastric polyps - likely fundic gland. Biopsied. - Gastritis. Biopsied. - No gross lesions in the duodenal bulb, in the first portion of the duodenum, in the second portion of the duodenum and in the major papilla.   Past Medical History:  Diagnosis Date   Abnormal nuclear cardiac imaging test    High-risk nuclear stress test    Adjustment disorder with mixed anxiety and depressed mood 07/27/2020   AKI (acute kidney injury) (HCC) 03/26/2020   Arthritis    Bicuspid aortic valve 03/27/2020   Formatting of this note might be different from the original. 2020: ECHO   Bilateral low back pain without sciatica 09/13/2015   Bilateral lower extremity  edema 08/18/2018   Bilateral lower extremity edema  Formatting of this note might be different from the original. Bilateral lower extremity edema  Last Assessment & Plan:  He does have 1-2+ pitting edema.  I am going to increase his Lasix to 80 mg a day, and will check a basic metabolic panel in 7 to 10 days.  He will see Baxter Hire back in 1 month for follow-up of lab work.   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 Admitted to [facility] Sept 2017 with SOB, not felt to be in CHF, Myoview was negative for sichemia  Formatting of this note might be different from the original. Overview:  LAD DES 2016. Admitted to [facility] Sept 2017 with SOB, not felt to be in CHF, Myoview was negative for sichemia  Last Assessment & Plan:  History of CAD status post LAD stenting by myself in 2016 with re-intervention because o   Carotid stenosis 04/07/2013   Rt CEA Oct 2015-Dr Chen  Last Assessment & Plan:  History of carotid artery disease status post right carotid endarterectomy by Dr. [name] October 2015 which he follows by duplex ultrasound. Formatting of this note might be different from the original. Rt CEA Oct 2015-Dr [name]  Last Assessment & Plan:  History of carotid artery disease status post right carotid endarterectomy by Dr. [name] October 2015   Carpal tunnel syndrome 07/17/2015  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HCC) 09/13/2015   Cerebrovascular accident (CVA) due to thrombosis of right middle cerebral artery (HCC) 07/04/2014   Questionable small stroke in 2014 with transient Lt sided weakness in setting of carotid disease.  Formatting of this note might be different from the original. Overview:  Questionable small stroke in 2014 with transient Lt sided weakness in setting of carotid disease.  Last Assessment & Plan:  History of multiple strokes in the past dating back to 2014 with a recent stroke in  April of this year a   Cervical spondylosis without myelopathy 01/31/2019   Chronic diarrhea 12/09/2018   Chronic hypoxemic respiratory failure (HCC) 06/19/2020   Chronic pain disorder 01/31/2019   Coronary artery disease    drug-eluting stents placed in proximal and mid LAD   Degenerative joint disease of thoracic spine 12/09/2018   Deviated septum    Diabetic retinopathy (HCC)    Dyslipidemia 12/09/2018   Dyspnea on exertion 03/26/2020   Dyspnea on exertion  Formatting of this note might be different from the original. Overview:  Dyspnea on exertion  Last Assessment & Plan:  Improved after stenting of his LAD   Erectile dysfunction 11/25/2015   Esophageal dysmotility    Essential hypertension 04/07/2013   hypertension  Formatting of this note might be different from the original. hypertension  Last Assessment & Plan:  History of essential hypertension her blood pressure measured at 162/70.  He is on benazepril 10 the morning and 20 in the evening as well as hydralazine, hydrochlorothiazide, and metoprolol.  Blood pressures have been on the high side.  I am going to increase his benazepril to 20 mg    Gastritis    GERD (gastroesophageal reflux disease)    "occasionally" (03/23/2015)   Heart attack (HCC)    "in 1997 was told I had signs of a small heart attack that I didn't know I'd had"   History of gout    "when I was younger"   History of kidney stones    passed   Hypogonadism male 01/11/2016   Internal hemorrhoids    Large liver 12/09/2018   Memory loss 03/15/2020   Migraine    "had my 1st and only in 12/2014" (03/23/2015)   Occipital neuralgia of right side 12/18/2016   Peripheral neuropathy    feet   Peripheral vascular disease (HCC)    carotid artery disease   PONV (postoperative nausea and vomiting) 2015   "was told I was confused and combative in recovery from the narcotics w/my carotid OR"  "During a colonoscopy my oxygen level dropped so low that I had to be woke up.-  03/2017 colonoscopy [facility]    Sleep apnea    "severe; couldn't afford mask" (03/23/2015) uses O2 at night.no C-pap   SOB (shortness of breath) 05/04/2019   Spondylosis of lumbar spine 01/31/2019   Stroke (HCC) 05/10/2019   Aphasia mild   Transaminitis 05/08/2019   Type 2 diabetes mellitus with diabetic neuropathy (HCC) 12/09/2018   Past Surgical History:  Procedure Laterality Date   AMPUTATION Right 03/27/2022   Procedure: RIGHT BELOW KNEE AMPUTATION;  Surgeon: Nadara Mustard, MD;  Location: [facility] OR;  Service: Orthopedics;  Laterality: Right;   APPLICATION OF WOUND VAC Right 03/27/2022   Procedure: APPLICATION OF WOUND VAC;  Surgeon: Nadara Mustard, MD;  Location: [facility] OR;  Service: Orthopedics;  Laterality: Right;   BILIARY DILATION  08/11/2019   Procedure: BILIARY DILATION;  Surgeon:  Mansouraty, Netty Starring., MD;  Location: [facility] ENDOSCOPY;  Service: Gastroenterology;;   BIOPSY  08/11/2019   Procedure: BIOPSY;  Surgeon: Lemar Lofty., MD;  Location: [facility] ENDOSCOPY;  Service: Gastroenterology;;   CHOLECYSTECTOMY N/A 08/31/2019   Procedure: LAPAROSCOPIC CHOLECYSTECTOMY;  Surgeon: Gaynelle Adu, MD;  Location: [facility] ORS;  Service: General;  Laterality: N/A;   CIRCUMCISION  1981   COLONOSCOPY W/ POLYPECTOMY     CORONARY ANGIOPLASTY WITH STENT PLACEMENT  03/23/2015   "2"   CORONARY STENT INTERVENTION N/A 09/29/2017   Procedure: CORONARY STENT INTERVENTION;  Surgeon: Runell Gess, MD;  Location: [facility] INVASIVE CV LAB;  Service: Cardiovascular;  Laterality: N/A;   ENDARTERECTOMY Right 09/26/2014   Procedure: RIGHT CAROTID ENDARTERECTOMY WITH PATCH ANGIOPLASTY;  Surgeon: Fransisco Hertz, MD;  Location: [facility] OR;  Service: Vascular;  Laterality: Right;   ENDOSCOPIC RETROGRADE CHOLANGIOPANCREATOGRAPHY (ERCP) WITH PROPOFOL N/A 08/11/2019   Procedure: ENDOSCOPIC RETROGRADE CHOLANGIOPANCREATOGRAPHY (ERCP) WITH PROPOFOL;  Surgeon: Lemar Lofty., MD;  Location: [facility] ENDOSCOPY;  Service:  Gastroenterology;  Laterality: N/A;   ESOPHAGOGASTRODUODENOSCOPY (EGD) WITH PROPOFOL N/A 08/11/2019   Procedure: ESOPHAGOGASTRODUODENOSCOPY (EGD) WITH PROPOFOL;  Surgeon: Meridee Score Netty Starring., MD;  Location: [facility] ENDOSCOPY;  Service: Gastroenterology;  Laterality: N/A;   EYE SURGERY Right May 2016   Cataract   EYE SURGERY Left July 2016   Cataract   HYDROCELE EXCISION  ~ 2008   IR EXCHANGE BILIARY DRAIN  07/21/2019   IR PERC CHOLECYSTOSTOMY  05/11/2019   IR RADIOLOGIST EVAL & MGMT  06/30/2019   LEFT HEART CATH AND CORONARY ANGIOGRAPHY N/A 09/29/2017   Procedure: LEFT HEART CATH AND CORONARY ANGIOGRAPHY;  Surgeon: Runell Gess, MD;  Location: [facility] INVASIVE CV LAB;  Service: Cardiovascular;  Laterality: N/A;   LEFT HEART CATHETERIZATION WITH CORONARY ANGIOGRAM N/A 03/23/2015   Procedure: LEFT HEART CATHETERIZATION WITH CORONARY ANGIOGRAM;  Surgeon: Runell Gess, MD;  Location: [facility] CATH LAB;  Service: Cardiovascular;  Laterality: N/A;   REFRACTIVE SURGERY Bilateral 08/2014-09/2014   Diabetic retinopathy    REMOVAL OF STONES  08/11/2019   Procedure: REMOVAL OF STONES;  Surgeon: Meridee Score Netty Starring., MD;  Location: [facility] ENDOSCOPY;  Service: Gastroenterology;;   Gaspar Bidding DILATION N/A 08/11/2019   Procedure: Gaspar Bidding DILATION;  Surgeon: Lemar Lofty., MD;  Location: [facility] ENDOSCOPY;  Service: Gastroenterology;  Laterality: N/A;   SPHINCTEROTOMY  08/11/2019   Procedure: SPHINCTEROTOMY;  Surgeon: Mansouraty, Netty Starring., MD;  Location: [facility] ENDOSCOPY;  Service: Gastroenterology;;    reports that he has never smoked. He has never used smokeless tobacco. He reports that he does not drink alcohol and does not use drugs. family history includes Asthma in his brother; Cancer in his father; Colon cancer in his brother and sister; Diabetes in his brother, daughter, maternal grandfather, and mother; Gout in his brother and maternal uncle; Heart attack in his father; Heart disease in his father and mother;  Hyperlipidemia in his brother, mother, and sister; Hypertension in his brother, mother, and sister; Stroke in his paternal grandfather. Allergies  Allergen Reactions   Metformin     Other reaction(s): GI Upset (intolerance) Even with XR      Outpatient Encounter Medications as of 10/25/2022  Medication Sig   allopurinol (ZYLOPRIM) 100 MG tablet Take 100 mg by mouth daily.   aspirin EC 81 MG tablet Take 81 mg by mouth daily at 12 noon. Swallow whole.   atorvastatin (LIPITOR) 80 MG tablet Take 80 mg by mouth daily.   bethanechol (URECHOLINE) 10 MG tablet Take 10  mg by mouth 3 (three) times daily.   carbidopa-levodopa (SINEMET IR) 25-100 MG tablet Take 0.5 tablets by mouth 2 (two) times daily. Parkinson's Disease   Cholecalciferol (VITAMIN D3 PO) Take 1,000 Units by mouth daily at 12 noon.   cholestyramine (QUESTRAN) 4 g packet Take 1 packet (4 g total) by mouth daily in the afternoon. Take 2 hours before or after all medications   clopidogrel (PLAVIX) 75 MG tablet Take 1 tablet (75 mg total) by mouth daily.   feeding supplement (ENSURE ENLIVE / ENSURE PLUS) LIQD Take 237 mLs by mouth 3 (three) times daily between meals.   Ferrous Sulfate (FEROSUL PO) Take 325 mg by mouth daily.   finasteride (PROSCAR) 5 MG tablet Take 5 mg by mouth daily.   fluticasone (FLOVENT HFA) 110 MCG/ACT inhaler Inhale 2 puffs into the lungs 2 (two) times daily.   Fluticasone Furoate-Vilanterol (BREO ELLIPTA IN) Inhale 1 puff into the lungs daily.   gabapentin (NEURONTIN) 300 MG capsule Take 300 mg by mouth at bedtime.   insulin glargine-yfgn (SEMGLEE) 100 UNIT/ML Pen Inject 30 Units into the skin at bedtime.   insulin lispro (HUMALOG) 100 UNIT/ML injection Inject 2-16 Units into the skin 3 (three) times daily before meals. 100-150= 2 units, 151-200= 4 units, 201-250= 6 units, 251-300= 8 units, 301-350-= 10 units, 351-400= 12 units, 401-450= 14 units, 451-500= 16 units 501 or above contact on call nurse for [place]    levothyroxine (SYNTHROID) 25 MCG tablet Take 25 mcg by mouth daily before breakfast.   loperamide (IMODIUM) 2 MG capsule Take 2 mg by mouth 4 (four) times daily as needed for diarrhea or loose stools.   metoprolol succinate (TOPROL XL) 25 MG 24 hr tablet Take 1 tablet (25 mg total) by mouth daily.   mirtazapine (REMERON) 15 MG tablet Take 15 mg by mouth at bedtime.   montelukast (SINGULAIR) 10 MG tablet Take 10 mg by mouth daily at 12 noon.   Multiple Vitamins-Minerals (PRESERVISION AREDS 2) CAPS Take 1 capsule by mouth daily at 12 noon.   neomycin-polymyxin-hydrocortisone (CORTISPORIN) OTIC solution Apply 1-2 drops to each toenail corners at bedtime for pain and infection at ingrowing nails   Pancrelipase, Lip-Prot-Amyl, (CREON PO) Take 1 capsule by mouth 2 (two) times daily with a meal. or snacks   pantoprazole (PROTONIX) 40 MG tablet Take 1 tablet (40 mg total) by mouth daily.   potassium chloride SA (KLOR-CON M) 20 MEQ tablet Take 20 mEq by mouth every other day.   spironolactone (ALDACTONE) 25 MG tablet Take 25 mg by mouth daily.   tamsulosin (FLOMAX) 0.4 MG CAPS capsule Take 1 capsule (0.4 mg total) by mouth daily.   torsemide (DEMADEX) 20 MG tablet Take 1 tablet (20 mg total) by mouth daily.   No facility-administered encounter medications on file as of 10/25/2022.    REVIEW OF SYSTEMS  : All other systems reviewed and negative except where noted in the History of Present Illness.   PHYSICAL EXAM: Ht  (1.676 m)   BMI 33.16 kg/m  General: Chronically ill-appearing male in no acute distress; in wheelchair Head: Normocephalic and atraumatic Eyes:  Sclerae anicteric, conjunctiva pink. Ears: Normal auditory acuity Lungs: Clear throughout to auscultation; no W/R/R. Heart: Regular rate and rhythm; no M/R/G. Abdomen: Soft, non-distended.  BS present.  Non-tender. Skin: No lesions on visible extremities Extremities: Right BKA Neurological: Alert oriented x 4, grossly  non-focal Psychological:  Alert and cooperative. Normal mood and affect  ASSESSMENT AND PLAN: *Dysphagia: Complains  of stuff collecting high in his throat and a gurgling noise.  This does not sound like esophageal dysphagia.  It sounds like oral pharyngeal dysphagia, likely related to previous CVAs, etc.  He had modified barium swallow study performed earlier this week and I have the recommendations from them, but I do not have the actual reports.  We will request that.  We are going to schedule a barium esophagram with tablet to assess the structure of the esophagus itself and see if there is any overt narrowing.  He had an EGD in 2020 that did not show any mucosal changes, but was empirically dilated.  At this point I think he is very high risk for procedure that would require holding Plavix for 5 days along with just the risk of sedation with all of his other medical problems.  This would certainly need to be done at [facility] long hospital if needed.   CC:  Dough, Doris Cheadle, MD

## 2022-10-25 NOTE — Patient Instructions (Signed)
You have been scheduled for a Barium Esophogram at [facility] Radiology (1st floor of the hospital) on Monday 11/04/22 at 11:00 am. Please arrive 30 minutes prior to your appointment for registration. Make certain not to have anything to eat or drink 3 hours prior to your test. If you need to reschedule for any reason, please contact radiology at [phone] to do so. __________________________________________________________________ A barium swallow is an examination that concentrates on views of the esophagus. This tends to be a double contrast exam (barium and two liquids which, when combined, create a gas to distend the wall of the oesophagus) or single contrast (non-ionic iodine based). The study is usually tailored to your symptoms so a good history is essential. Attention is paid during the study to the form, structure and configuration of the esophagus, looking for functional disorders (such as aspiration, dysphagia, achalasia, motility and reflux) EXAMINATION You may be asked to change into a gown, depending on the type of swallow being performed. A radiologist and radiographer will perform the procedure. The radiologist will advise you of the type of contrast selected for your procedure and direct you during the exam. You will be asked to stand, sit or lie in several different positions and to hold a small amount of fluid in your mouth before being asked to swallow while the imaging is performed .In some instances you may be asked to swallow barium coated marshmallows to assess the motility of a solid food bolus. The exam can be recorded as a digital or video fluoroscopy procedure. POST PROCEDURE It will take 1-2 days for the barium to pass through your system. To facilitate this, it is important, unless otherwise directed, to increase your fluids for the next 24-48hrs and to resume your normal diet.  This test typically takes about 30 minutes to  perform. __________________________________________________________________________________  If you are age 41 or older, your body mass index should be between 23-30. Your Body mass index is 33.16 kg/m. If this is out of the aforementioned range listed, please consider follow up with your Primary Care Provider.  If you are age 49 or younger, your body mass index should be between 19-25. Your Body mass index is 33.16 kg/m. If this is out of the aformentioned range listed, please consider follow up with your Primary Care Provider.   ________________________________________________________  The [facility] providers would like to encourage you to use [facility] to communicate with providers for non-urgent requests or questions.  Due to long hold times on the telephone, sending your provider a message by [facility] may be a faster and more efficient way to get a response.  Please allow 48 business hours for a response.  Please remember that this is for non-urgent requests.  _______________________________________________________  Due to recent changes in healthcare laws, you may see the results of your imaging and laboratory studies on MyChart before your provider has had a chance to review them.  We understand that in some cases there may be results that are confusing or concerning to you. Not all laboratory results come back in the same time frame and the provider may be waiting for multiple results in order to interpret others.  Please give [place] 48 hours in order for your provider to thoroughly review all the results before contacting the office for clarification of your results.    **left message for Don Broach at [facility] [place] asking for transportation to be arranged for patient so he can get imaging studies done on 11/04/22 at  11:00 am with 10:30 am arrival. I have asked that she return my call. Vernia Buff CMA 3:22 pm 10/25/22.

## 2022-10-27 NOTE — Progress Notes (Signed)
Agree with assessment/plan.  Raj Joline Encalada, MD [facility] [phone]  

## 2022-11-04 ENCOUNTER — Ambulatory Visit (HOSPITAL_COMMUNITY)
Admission: RE | Admit: 2022-11-04 | Discharge: 2022-11-04 | Disposition: A | Discharge status: Home / Self Care | Payer: No Typology Code available for payment source | Source: Ambulatory Visit | Attending: Gastroenterology | Admitting: Gastroenterology

## 2022-11-04 DIAGNOSIS — R1310 Dysphagia, unspecified: Secondary | ICD-10-CM | POA: Insufficient documentation

## 2022-11-14 ENCOUNTER — Telehealth: Payer: Self-pay | Admitting: Orthopedic Surgery

## 2022-11-14 NOTE — Telephone Encounter (Signed)
Received vm from [facility] requesitng last ov note. I faxed [phone], callback [phone]

## 2022-11-22 ENCOUNTER — Ambulatory Visit: Payer: No Typology Code available for payment source | Admitting: Gastroenterology

## 2023-01-06 ENCOUNTER — Other Ambulatory Visit: Payer: Self-pay | Admitting: *Deleted

## 2023-01-06 DIAGNOSIS — I6522 Occlusion and stenosis of left carotid artery: Secondary | ICD-10-CM

## 2023-01-20 ENCOUNTER — Ambulatory Visit (INDEPENDENT_AMBULATORY_CARE_PROVIDER_SITE_OTHER): Payer: No Typology Code available for payment source | Admitting: Physician Assistant

## 2023-01-20 ENCOUNTER — Ambulatory Visit (HOSPITAL_COMMUNITY)
Admission: RE | Admit: 2023-01-20 | Discharge: 2023-01-20 | Disposition: A | Discharge status: Home / Self Care | Payer: No Typology Code available for payment source | Source: Ambulatory Visit | Attending: Physician Assistant | Admitting: Physician Assistant

## 2023-01-20 VITALS — BP 127/69 | HR 75 | Temp 97.7°F | Resp 20 | Ht 66.0 in

## 2023-01-20 DIAGNOSIS — I6522 Occlusion and stenosis of left carotid artery: Secondary | ICD-10-CM | POA: Diagnosis present

## 2023-01-20 DIAGNOSIS — I6523 Occlusion and stenosis of bilateral carotid arteries: Secondary | ICD-10-CM | POA: Diagnosis not present

## 2023-01-20 DIAGNOSIS — I739 Peripheral vascular disease, unspecified: Secondary | ICD-10-CM

## 2023-01-21 NOTE — Progress Notes (Signed)
Established Carotid Patient   History of Present Illness   Johnathan Keith is a 62 y.o. (26-Dec-1960) male who presents for surveillance of carotid artery stenosis.  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  He still mobilizes using a [employer] fast streamer and wheelchair.  He has a history of right CEA by Dr. Bridgett Larsson in 2015.  At follow-up today, the patient is doing okay.  He developed a right heel ulceration in 2022, which was unamenable to revascularization due to his nonambulatory status.  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 Sharol Given.  He denies any other changes to his health.  He denies any strokelike symptoms.  Current Outpatient Medications  Medication Sig Dispense Refill   allopurinol (ZYLOPRIM) 100 MG tablet Take 100 mg by mouth daily.     aspirin EC 81 MG tablet Take 81 mg by mouth daily at 12 noon. Swallow whole.     atorvastatin (LIPITOR) 80 MG tablet Take 80 mg by mouth daily.     bethanechol (URECHOLINE) 10 MG tablet Take 10 mg by mouth 3 (three) times daily.     carbidopa-levodopa (SINEMET IR) 25-100 MG tablet Take 0.5 tablets by mouth 2 (two) times daily. Parkinson's Disease     Cholecalciferol (VITAMIN D3 PO) Take 1,000 Units by mouth daily at 12 noon.     cholestyramine (QUESTRAN) 4 g packet Take 1 packet (4 g total) by mouth daily in the afternoon. Take 2 hours before or after all medications 30 each 6   clopidogrel (PLAVIX) 75 MG tablet Take 1 tablet (75 mg total) by mouth daily. 90 tablet 3   feeding supplement (ENSURE ENLIVE / ENSURE PLUS) LIQD Take 237 mLs by mouth 3 (three) times daily between meals. 237 mL 12   Ferrous Sulfate (FEROSUL PO) Take 325 mg by mouth daily.     finasteride (PROSCAR) 5 MG tablet Take 5 mg by mouth daily.     fluticasone (FLOVENT HFA) 110 MCG/ACT inhaler Inhale 2 puffs into the lungs 2 (two) times daily.     Fluticasone  Furoate-Vilanterol (BREO ELLIPTA IN) Inhale 1 puff into the lungs daily.     gabapentin (NEURONTIN) 300 MG capsule Take 300 mg by mouth at bedtime.     insulin glargine-yfgn (SEMGLEE) 100 UNIT/ML Pen Inject 30 Units into the skin at bedtime.     insulin lispro (HUMALOG) 100 UNIT/ML injection Inject 2-16 Units into the skin 3 (three) times daily before meals. 100-150= 2 units, 151-200= 4 units, 201-250= 6 units, 251-300= 8 units, 301-350-= 10 units, 351-400= 12 units, 401-450= 14 units, 451-500= 16 units 501 or above contact on call nurse for [facility]     levothyroxine (SYNTHROID) 25 MCG tablet Take 25 mcg by mouth daily before breakfast.     loperamide (IMODIUM) 2 MG capsule Take 2 mg by mouth 4 (four) times daily as needed for diarrhea or loose stools.     metoprolol succinate (TOPROL XL) 25 MG 24 hr tablet Take 1 tablet (25 mg total) by mouth daily. 90 tablet 3   mirtazapine (REMERON) 15 MG tablet Take 15 mg by mouth at bedtime.     montelukast (SINGULAIR) 10 MG tablet Take 10 mg by mouth daily at 12 noon.     Multiple Vitamins-Minerals (PRESERVISION AREDS 2) CAPS Take 1 capsule by mouth daily at 12 noon.     neomycin-polymyxin-hydrocortisone (CORTISPORIN) OTIC solution Apply 1-2 drops to each  toenail corners at bedtime for pain and infection at ingrowing nails 10 mL 1   Pancrelipase, Lip-Prot-Amyl, (CREON PO) Take 1 capsule by mouth 2 (two) times daily with a meal. or snacks     pantoprazole (PROTONIX) 40 MG tablet Take 1 tablet (40 mg total) by mouth daily. 90 tablet 3   potassium chloride SA (KLOR-CON M) 20 MEQ tablet Take 20 mEq by mouth every other day.     spironolactone (ALDACTONE) 25 MG tablet Take 25 mg by mouth daily.     tamsulosin (FLOMAX) 0.4 MG CAPS capsule Take 1 capsule (0.4 mg total) by mouth daily. 30 capsule    torsemide (DEMADEX) 20 MG tablet Take 1 tablet (20 mg total) by mouth daily. 90 tablet 3   No current facility-administered medications for this visit.    REVIEW OF  SYSTEMS (negative unless checked):   Cardiac:  []$  Chest pain or chest pressure? []$  Shortness of breath upon activity? []$  Shortness of breath when lying flat? []$  Irregular heart rhythm?  Vascular:  []$  Pain in calf, thigh, or hip brought on by walking? []$  Pain in feet at night that wakes you up from your sleep? []$  Blood clot in your veins? []$  Leg swelling?  Pulmonary:  []$  Oxygen at home? []$  Productive cough? []$  Wheezing?  Neurologic:  []$  Sudden weakness in arms or legs? []$  Sudden numbness in arms or legs? []$  Sudden onset of difficult speaking or slurred speech? []$  Temporary loss of vision in one eye? []$  Problems with dizziness?  Gastrointestinal:  []$  Blood in stool? []$  Vomited blood?  Genitourinary:  []$  Burning when urinating? []$  Blood in urine?  Psychiatric:  []$  Major depression  Hematologic:  []$  Bleeding problems? []$  Problems with blood clotting?  Dermatologic:  []$  Rashes or ulcers?  Constitutional:  []$  Fever or chills?  Ear/Nose/Throat:  []$  Change in hearing? []$  Nose bleeds? []$  Sore throat?  Musculoskeletal:  []$  Back pain? []$  Joint pain? []$  Muscle pain?   Physical Examination   Vitals:   01/20/23 1243 01/20/23 1245  BP: 125/67 127/69  Pulse: 75   Resp: 20   Temp: 97.7 F (36.5 C)   TempSrc: Temporal   SpO2: 99%   Height: 5' 6"$  (1.676 m)    Body mass index is 33.16 kg/m.  General:  WDWN in NAD; vital signs documented above Gait: Not observed HENT: WNL, normocephalic Pulmonary: normal non-labored breathing, on supplemental oxygen Cardiac: Regular rate and rhythm Abdomen: soft, NT, no masses Skin: without rashes Vascular Exam/Pulses: Palpable radial pulses bilaterally Extremities: Left lower extremity warm well-perfused.  Right BKA well-healed Musculoskeletal: no muscle wasting or atrophy  Neurologic: A&O X 3;  No focal weakness or paresthesias are detected Psychiatric:  The pt has Normal affect.  Non-Invasive Vascular Imaging   B  Carotid Duplex (01/20/2023):  R ICA stenosis:  1-39% R VA:  patent and antegrade L ICA stenosis:  1-39% L VA:  patent and antegrade   Medical Decision Making   Jojo Sifers is a 62 y.o. male who presents for surveillance of bilateral carotid artery stenosis  Based on the patient's vascular studies, his bilateral carotid artery stenosis is unchanged, 1 to 39% He denies any stroke or TIA-like symptoms such as sudden weakness/numbness, amaurosis fugax, slurred speech He had a right BKA in 2023 since we last saw him.  This is now well-healed.  He is still nonambulatory He can follow-up with our office in 1 year with repeat carotid studies    [facility]  PA-C [facility] Office: [facility] MD: Trula Slade

## 2023-01-23 ENCOUNTER — Ambulatory Visit: Payer: No Typology Code available for payment source | Admitting: Orthopedic Surgery

## 2023-02-17 DIAGNOSIS — K648 Other hemorrhoids: Secondary | ICD-10-CM | POA: Insufficient documentation

## 2023-02-17 DIAGNOSIS — K224 Dyskinesia of esophagus: Secondary | ICD-10-CM | POA: Insufficient documentation

## 2023-02-17 DIAGNOSIS — K2970 Gastritis, unspecified, without bleeding: Secondary | ICD-10-CM | POA: Insufficient documentation

## 2023-02-19 NOTE — Progress Notes (Unsigned)
Cardiology Office Note:    Date:  02/20/2023   ID:  Johnathan Keith, DOB 1961-10-14, MRN [id number]  PCP:  Algis Greenhouse, MD  Cardiologist:  Shirlee More, MD    Referring MD: Algis Greenhouse, MD    ASSESSMENT:    1. Hypertensive heart disease with heart failure ([place])   2. Coronary artery disease of native artery of native heart with stable angina pectoris ([place])   3. High cholesterol   4. Stage 3b chronic kidney disease (CKD) ([place])   5. Anemia due to stage 3 chronic kidney disease treated with erythropoietin (HCC)    PLAN:    In order of problems listed above: Overall he is doing well his heart failure is nicely compensated no fluid Stable loop diuretic continue nadolol with the spironolactone.  His last creatinine available to me 03/21/2022 was 1.84 with a potassium of 3.9. Continue his beta-blocker for hypertension and CAD Continues dual antiplatelet therapy with peripheral vascular and coronary disease stable CAD having no anginal discomfort continue reduced dose lipid-lowering therapy a atorvastatin CKD and anemia being managed by his attending physicians unfortunately do not have access to his most recent labs    Next appointment: 6 months   Medication Adjustments/Labs and Tests Ordered: Current medicines are reviewed at length with the patient today.  Concerns regarding medicines are outlined above.  Orders Placed This Encounter  Procedures   EKG 12-Lead   No orders of the defined types were placed in this encounter.   Chief Complaint  Patient presents with   Follow-up   Congestive Heart Failure    History of Present Illness:    Johnathan Keith is a 62 y.o. male with a hx of CAD hypertensive heart disease with heart failure ejection fraction 40 to 45% type 2 diabetes hyperlipidemia carotid disease with right carotid endarterectomy and stroke last seen 04/04/2022.  Compliance with diet, lifestyle and medications: Yes  He remains in a stable program now for 2  years his wife relates he had recent labs LDL was quite low and he agreed to reduce him from 80 to 20 mg of atorvastatin a day He has had no hospitalizations his weight has decreased 40 pounds over time he is not having edema shortness of breath chest pain palpitation or syncope He has had no hospitalizations Past Medical History:  Diagnosis Date   Abnormal nuclear cardiac imaging test    High-risk nuclear stress test    Adjustment disorder with mixed anxiety and depressed mood 07/27/2020   AKI (acute kidney injury) ([place]) 03/26/2020   Arthritis    Bicuspid aortic valve 03/27/2020   Formatting of this note might be different from the original. 2020: ECHO   Bilateral low back pain without sciatica 09/13/2015   Bilateral lower extremity edema 08/18/2018   Bilateral lower extremity edema  Formatting of this note might be different from the original. Bilateral lower extremity edema  Last Assessment & Plan:  He does have 1-2+ pitting edema.  I am going to increase his Lasix to 80 mg a day, and will check a basic metabolic panel in 7 to 10 days.  He will see Cyril Mourning back in 1 month for follow-up of lab work.   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 Admitted to [facility] Sept 2017 with SOB, not felt to be in CHF, Myoview was negative for sichemia  Formatting of this note might be different from  the original. Overview:  LAD DES 2016. Admitted to [facility] Sept 2017 with SOB, not felt to be in CHF, Myoview was negative for sichemia  Last Assessment & Plan:  History of CAD status post LAD stenting by myself in 2016 with re-intervention because o   Carotid stenosis 04/07/2013   Rt CEA Oct 2015-Dr Chen  Last Assessment & Plan:  History of carotid artery disease status post right carotid endarterectomy by Dr. Bridgett Larsson October 2015 which he follows by duplex ultrasound. Formatting of this note might be different from the original. Rt  CEA Oct 2015-Dr Bridgett Larsson  Last Assessment & Plan:  History of carotid artery disease status post right carotid endarterectomy by Dr. Bridgett Larsson October 2015   Carpal tunnel syndrome 07/17/2015   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[place]) 09/13/2015   Cerebrovascular accident (CVA) due to thrombosis of right middle cerebral artery ([place]) 07/04/2014   Questionable small stroke in 2014 with transient Lt sided weakness in setting of carotid disease.  Formatting of this note might be different from the original. Overview:  Questionable small stroke in 2014 with transient Lt sided weakness in setting of carotid disease.  Last Assessment & Plan:  History of multiple strokes in the past dating back to 2014 with a recent stroke in April of this year a   Cervical spondylosis without myelopathy 01/31/2019   Chronic diarrhea 12/09/2018   Chronic hypoxemic respiratory failure ([place]) 06/19/2020   Chronic pain disorder 01/31/2019   Coronary artery disease    drug-eluting stents placed in proximal and mid LAD   Degenerative joint disease of thoracic spine 12/09/2018   Deviated septum    Diabetic retinopathy ([place])    Dyslipidemia 12/09/2018   Dyspnea on exertion 03/26/2020   Dyspnea on exertion  Formatting of this note might be different from the original. Overview:  Dyspnea on exertion  Last Assessment & Plan:  Improved after stenting of his LAD   Erectile dysfunction 11/25/2015   Esophageal dysmotility    Essential hypertension 04/07/2013   hypertension  Formatting of this note might be different from the original. hypertension  Last Assessment & Plan:  History of essential hypertension her blood pressure measured at 162/70.  He is on benazepril 10 the morning and 20 in the evening as well as hydralazine, hydrochlorothiazide, and metoprolol.  Blood pressures have been on the high side.  I am going to increase his benazepril to 20 mg    Gastritis    GERD (gastroesophageal reflux disease)     "occasionally" (03/23/2015)   Heart attack ([place])    "in 1997 was told I had signs of a small heart attack that I didn't know I'd had"   History of gout    "when I was younger"   History of kidney stones    passed   Hypogonadism male 01/11/2016   Internal hemorrhoids    Large liver 12/09/2018   Memory loss 03/15/2020   Migraine    "had my 1st and only in 12/2014" (03/23/2015)   Occipital neuralgia of right side 12/18/2016   Peripheral neuropathy    feet   Peripheral vascular disease ([place])    carotid artery disease   PONV (postoperative nausea and vomiting) 2015   "was told I was confused and combative in recovery from the narcotics w/my carotid OR"  "During a colonoscopy my oxygen level dropped so low that I had to be woke up.- 03/2017 colonoscopy [facility]    Sleep apnea    "  severe; couldn't afford mask" (03/23/2015) uses O2 at night.no C-pap   SOB (shortness of breath) 05/04/2019   Spondylosis of lumbar spine 01/31/2019   Stroke ([place]) 05/10/2019   Aphasia mild   Transaminitis 05/08/2019   Type 2 diabetes mellitus with diabetic neuropathy ([place]) 12/09/2018    Past Surgical History:  Procedure Laterality Date   AMPUTATION Right 03/27/2022   Procedure: RIGHT BELOW KNEE AMPUTATION;  Surgeon: Newt Minion, MD;  Location: [place];  Service: Orthopedics;  Laterality: Right;   APPLICATION OF WOUND VAC Right 03/27/2022   Procedure: APPLICATION OF WOUND VAC;  Surgeon: Newt Minion, MD;  Location: [place];  Service: Orthopedics;  Laterality: Right;   BILIARY DILATION  08/11/2019   Procedure: BILIARY DILATION;  Surgeon: Rush Landmark Telford Nab., MD;  Location: [place];  Service: Gastroenterology;;   BIOPSY  08/11/2019   Procedure: BIOPSY;  Surgeon: Irving Copas., MD;  Location: [place];  Service: Gastroenterology;;   CHOLECYSTECTOMY N/A 08/31/2019   Procedure: LAPAROSCOPIC CHOLECYSTECTOMY;  Surgeon: Greer Pickerel, MD;  Location: [facility];  Service: General;  Laterality: N/A;    [place]  03/23/2015   "2"   CORONARY STENT INTERVENTION N/A 09/29/2017   Procedure: CORONARY STENT INTERVENTION;  Surgeon: Lorretta Harp, MD;  Location: [place] [facility];  Service: Cardiovascular;  Laterality: N/A;   ENDARTERECTOMY Right 09/26/2014   Procedure: RIGHT CAROTID ENDARTERECTOMY WITH PATCH ANGIOPLASTY;  Surgeon: Conrad Sec[place], MD;  Location: [facility] OR;  Service: Vascular;  Laterality: Right;   ENDOSCOPIC RETROGRADE CHOLANGIOPANCREATOGRAPHY (ERCP) WITH PROPOFOL N/A 08/11/2019   Procedure: ENDOSCOPIC RETROGRADE CHOLANGIOPANCREATOGRAPHY (ERCP) WITH PROPOFOL;  Surgeon: Irving Copas., MD;  Location: [place];  Service: Gastroenterology;  Laterality: N/A;   ESOPHAGOGASTRODUODENOSCOPY (EGD) WITH PROPOFOL N/A 08/11/2019   Procedure: ESOPHAGOGASTRODUODENOSCOPY (EGD) WITH PROPOFOL;  Surgeon: Rush Landmark Telford Nab., MD;  Location: [place];  Service: Gastroenterology;  Laterality: N/A;   EYE SURGERY Right May 2016   Cataract   EYE SURGERY Left July 2016   Cataract   HYDROCELE EXCISION  ~ 2008   IR EXCHANGE BILIARY DRAIN  07/21/2019   IR PERC CHOLECYSTOSTOMY  05/11/2019   IR RADIOLOGIST EVAL & MGMT  06/30/2019   LEFT HEART CATH AND CORONARY ANGIOGRAPHY N/A 09/29/2017   Procedure: LEFT HEART CATH AND CORONARY ANGIOGRAPHY;  Surgeon: Lorretta Harp, MD;  Location: [place] [facility];  Service: Cardiovascular;  Laterality: N/A;   LEFT HEART CATHETERIZATION WITH CORONARY ANGIOGRAM N/A 03/23/2015   Procedure: LEFT HEART CATHETERIZATION WITH CORONARY ANGIOGRAM;  Surgeon: Lorretta Harp, MD;  Location: [facility] CATH LAB;  Service: Cardiovascular;  Laterality: N/A;   REFRACTIVE SURGERY Bilateral 08/2014-09/2014   Diabetic retinopathy    REMOVAL OF STONES  08/11/2019   Procedure: REMOVAL OF STONES;  Surgeon: Rush Landmark Telford Nab., MD;  Location: [place];  Service: Gastroenterology;;   Azzie Almas  DILATION N/A 08/11/2019   Procedure: Azzie [place]mas DILATION;  Surgeon: Irving Copas., MD;  Location: [place];  Service: Gastroenterology;  Laterality: N/A;   SPHINCTEROTOMY  08/11/2019   Procedure: SPHINCTEROTOMY;  Surgeon: Mansouraty, Telford Nab., MD;  Location: [place];  Service: Gastroenterology;;    Current Medications: Current Meds  Medication Sig   allopurinol (ZYLOPRIM) 100 MG tablet Take 100 mg by mouth daily.   aspirin EC 81 MG tablet Take 81 mg by mouth daily at 12 noon. Swallow whole.   atorvastatin (LIPITOR) 80 MG tablet Take  80 mg by mouth daily.   bethanechol (URECHOLINE) 10 MG tablet Take 10 mg by mouth 3 (three) times daily.   carbidopa-levodopa (SINEMET IR) 25-100 MG tablet Take 0.5 tablets by mouth 2 (two) times daily. Parkinson's Disease   Cholecalciferol (VITAMIN D3 PO) Take 1,000 Units by mouth daily at 12 noon.   cholestyramine (QUESTRAN) 4 g packet Take 1 packet (4 g total) by mouth daily in the afternoon. Take 2 hours before or after all medications   clopidogrel (PLAVIX) 75 MG tablet Take 1 tablet (75 mg total) by mouth daily.   feeding supplement (ENSURE ENLIVE / ENSURE PLUS) LIQD Take 237 mLs by mouth 3 (three) times daily between meals.   Ferrous Sulfate (FEROSUL PO) Take 325 mg by mouth daily.   finasteride (PROSCAR) 5 MG tablet Take 5 mg by mouth daily.   fluticasone (FLOVENT HFA) 110 MCG/ACT inhaler Inhale 2 puffs into the lungs 2 (two) times daily.   Fluticasone Furoate-Vilanterol (BREO ELLIPTA IN) Inhale 1 puff into the lungs daily.   gabapentin (NEURONTIN) 300 MG capsule Take 300 mg by mouth at bedtime.   insulin glargine-yfgn (SEMGLEE) 100 UNIT/ML Pen Inject 30 Units into the skin at bedtime.   insulin lispro (HUMALOG) 100 UNIT/ML injection Inject 2-16 Units into the skin 3 (three) times daily before meals. 100-150= 2 units, 151-200= 4 units, 201-250= 6 units, 251-300= 8 units, 301-350-= 10 units, 351-400= 12 units, 401-450= 14 units, 451-500= 16  units 501 or above contact on call nurse for [place]   levothyroxine (SYNTHROID) 25 MCG tablet Take 25 mcg by mouth daily before breakfast.   loperamide (IMODIUM) 2 MG capsule Take 2 mg by mouth 4 (four) times daily as needed for diarrhea or loose stools.   metoprolol succinate (TOPROL XL) 25 MG 24 hr tablet Take 1 tablet (25 mg total) by mouth daily.   mirtazapine (REMERON) 15 MG tablet Take 15 mg by mouth at bedtime.   montelukast (SINGULAIR) 10 MG tablet Take 10 mg by mouth daily at 12 noon.   Multiple Vitamins-Minerals (PRESERVISION AREDS 2) CAPS Take 1 capsule by mouth daily at 12 noon.   neomycin-polymyxin-hydrocortisone (CORTISPORIN) OTIC solution Apply 1-2 drops to each toenail corners at bedtime for pain and infection at ingrowing nails   Pancrelipase, Lip-Prot-Amyl, (CREON PO) Take 1 capsule by mouth 2 (two) times daily with a meal. or snacks   pantoprazole (PROTONIX) 40 MG tablet Take 1 tablet (40 mg total) by mouth daily.   potassium chloride SA (KLOR-CON M) 20 MEQ tablet Take 20 mEq by mouth every other day.   spironolactone (ALDACTONE) 25 MG tablet Take 25 mg by mouth daily.   tamsulosin (FLOMAX) 0.4 MG CAPS capsule Take 1 capsule (0.4 mg total) by mouth daily.   torsemide (DEMADEX) 20 MG tablet Take 1 tablet (20 mg total) by mouth daily.     Allergies:   Metformin   Social History   Socioeconomic History   Marital status: Married    Spouse name: Not on file   Number of children: 5   Years of education: 12th   Highest education level: Not on file  Occupational History   Occupation: Disabled  Tobacco Use   Smoking status: Never    Passive exposure: Never   Smokeless tobacco: Never  Vaping Use   Vaping Use: Never used  Substance and Sexual Activity   Alcohol use: No    Alcohol/week: 0.0 standard drinks of alcohol   Drug use: No   Sexual activity: Not  Currently    Partners: Female  Other Topics Concern   Not on file  Social History Narrative   Patient is  married with 5 children.    Patient is right handed.   Patient has hs education.   Patient drinks 4 12 oz can sodas daily.   Social Determinants of Health   Financial Resource Strain: Not on file  Food Insecurity: Not on file  Transportation Needs: Not on file  Physical Activity: Not on file  Stress: Not on file  Social Connections: Not on file     Family History: The patient's family history includes Asthma in his brother; Cancer in his father; Colon cancer in his brother and sister; Diabetes in his brother, daughter, maternal grandfather, and mother; Gout in his brother and maternal uncle; Heart attack in his father; Heart disease in his father and mother; Hyperlipidemia in his brother, mother, and sister; Hypertension in his brother, mother, and sister; Stroke in his paternal grandfather. ROS:   Please see the history of present illness.    All other systems reviewed and are negative.  EKGs/Labs/Other Studies Reviewed:    The following studies were reviewed today:  Cardiac Studies & Procedures   CARDIAC CATHETERIZATION  CARDIAC CATHETERIZATION 09/29/2017  Narrative Images from the original result were not included.   Prox LAD to Mid LAD lesion, 0 %stenosed.  Mid LAD to Dist LAD lesion, 0 %stenosed.  Dist LAD lesion, 80 %stenosed.  Post intervention, there is a 5% residual stenosis.  A stent was successfully placed.  The left ventricular systolic function is normal.  LV end diastolic pressure is normal.  The left ventricular ejection fraction is 55-65% by visual estimate.  Clarnece Dort is a 62 y.o. male   [id number] LOCATION:  FACILITY: [facility] PHYSICIAN: Quay Burow, M.D. 1961/01/29   DATE OF PROCEDURE:  09/29/2017  DATE OF DISCHARGE:     CARDIAC CATHETERIZATION / PCI DES LAD    History obtained from chart review.Taryn Furia is a 62 y.o. male mildly overweight married Caucasian male who I last saw in the office 08/27/17.  In addition to  hypertension, his cardiovascular history is significant for prior TIA subsequent to carotid artery disease. He underwent right carotid endarterectomy performed by Dr. Bridgett Larsson on 09/26/2014. There is also question of remote myocardial infarction in the past. He recently underwent evaluation with a 2-D echocardiogram that was performed preoperatively which showed normal LV function as well as a Myoview stress test that had subtle inferior and lateral ischemia.  At that time he had no SOB or chest pain and was cleared for surgery. His history is also notable for hyperlipidemia and diabetes. He was last seen by Dr. Gwenlyn Found 10/26/2014. During that time, he complained of symptomatic hypotension with systolic blood pressures in the low 90s. Subsequently Dr. Gwenlyn Found decided to decrease his lisinopril down from 40 mg to 20 mg daily and now down to 10 mg daily.  He is taking a forth of 40 mg table.   Since that time he has been seen by our office pharmacist Tommy Medal for labile blood pressures. Though over all BP is improved.  He is on most of his meds.  He has been on lasix 80 mg daily for a year for lower ext. Edema.  His K+ has been elevated at times and currently he is off the kdur. His most recent lipid profile in our chart for/13/16 revealed total cholesterol 132, LDL 54 and HDL of 34.  In addition to lower ext  edema he has DOE.  No chest pain but walking in grocery store and to car he is very SOB.  This has progressed since Nuc study.  He has several risk factors:  DM, hyperlipidemia, vascular disease and family hx CAD. His nuclear study was high risk for anterolateral and inferior ischemia. Based on this, and in light of his increasing dyspnea on exertion as a potential anginal equivalent, he underwent diagnostic coronary arteriography by myself 03/23/15 revealing proximal and mid LAD stenoses which I intervened on with drug-eluting stents. The circumflex and RCA were free of significant disease as LV function was  preserved. Since intervention the symptoms have markedly improved.  since I saw him a year ago he's remained clinically stable. He underwent nasal surgery 06/04/16. He saw Kerin Ransom in the office 08/02/16 was normal as well. He continues to complain of some dyspnea on exertion however.  Since I saw him a year ago he has noticed some recent weight gain thought to be related to fluid accumulation as well as dyspnea with associated chest tightness over the last 6 months. His diuretics were recently adjusted.  Since I saw him in the office approximately a month ago I did obtain a 2-D echocardiogram on 09/05/17 as well as a Myoview stress test on 09/09/17 both of which were normal. He continues to have symptoms of dyspnea which he says are similar to his preintervention symptoms. Because of this, I elected to bring him in today for same day outpatient cardiac catheterization to define his anatomy and rule out an ischemic etiology.   PROCEDURE DESCRIPTION:  The patient was brought to the second floor [facility] cath lab in the postabsorptive state. He was not premedicated . His right wrist was prepped and shaved in usual sterile fashion. Xylocaine 1% was used for local anesthesia. A 6 French sheath was inserted into the right radial artery using standard Seldinger technique. The patient received 5000 units  of heparin intravenously.  A 5 [place] TIG catheter and pigtail catheters were used for selective coronary angiography and left ventriculography respectively. Isovue dye was used for the entirety of the case. Retrograde aorta, left ventricular and pullback pressures were recorded. Radial cocktail was administered via the SideArm sheath.  The patient received Angiomax bolus followed by infusion with a therapeutic ACT demonstrated. Using a 6 [place] XB LAD 3.5 cm guide catheter along with a 0.14 cm pro-water guidewire and a 2.25 mm x 12 mm long synergy drug-eluting stent primary stenting was performed.  The stent was carefully positioned angiographically and fluoroscopically and deployed at 16 atm (2.43 mm) reducing a fairly focal 80% mid to distal LAD lesion to 0% residual. 200 g of intra-coronary medication was administered. The patient received an additional 300 mg of by mouth Plavix at the end of the case. The guidewire and catheter were removed and the sheath was removed as well. A TR band was placed on the right wrist to achieve patent hemostasis.   IMPRESSION:successful mid to distal LAD primary PCI and drug-eluting stenting using a Synergy 2.25 mm x 12 mm millimeter long drug-eluting stent deployed at 2.43 mm (16 atm). This may be because the patient's dyspnea since it was similar to his prior symptoms which were alleviated by proximal and mid LAD intervention to a half years ago. This was an uncomplicated low risk intervention. The patient is appropriate for same-day PCI discharge. I will see him back in the office next week. He will remain on dual antiplatelet therapy.  Quay Burow. MD, [facility] 09/29/2017 2:47 PM  Findings Coronary Findings Diagnostic  Dominance: Right  Left Anterior Descending Prox LAD to Mid LAD lesion with no stenosis was previously treated. Mid LAD to Dist LAD lesion with no stenosis was previously treated.  Intervention  Dist LAD lesion Angioplasty A stent was successfully placed. There is a 5% residual stenosis post intervention.   STRESS TESTS  MYOCARDIAL PERFUSION IMAGING 09/09/2017  Narrative  Nuclear stress EF: 60%. The left ventricular ejection fraction is normal (55-65%).  The study is normal.  This is a low risk study.   ECHOCARDIOGRAM  ECHOCARDIOGRAM COMPLETE 03/25/2022  Narrative ECHOCARDIOGRAM REPORT    Patient Name:   Johnathan Keith Date of Exam: 03/25/2022 Medical Rec #:  [id number]      Height:       66.0 in Accession #:    [id number]     Weight:       205.5 lb Date of Birth:  [birth year]      BSA:          2.023 m Patient  Age:    24 years       BP:           113/65 mmHg Patient Gender: M              HR:           83 bpm. Exam Location:  Inpatient  Procedure: 2D Echo  Indications:    Stroke  History:        Patient has prior history of Echocardiogram examinations, most recent 05/10/2019. CAD, Stroke, Arrythmias:Bradycardia; Risk Factors:Diabetes and Hypertension.  Sonographer:    Arlyss Gandy Referring Phys: [id number] [facility]   Sonographer Comments: Image acquisition challenging due to patient body habitus. IMPRESSIONS   1. Left ventricular ejection fraction, by estimation, is 55 to 60%. The left ventricle has normal function. The left ventricle has no regional wall motion abnormalities. There is mild left ventricular hypertrophy. Left ventricular diastolic parameters are consistent with Grade I diastolic dysfunction (impaired relaxation). 2. Right ventricular systolic function is mildly reduced. The right ventricular size is normal. Tricuspid regurgitation signal is inadequate for assessing PA pressure. 3. The mitral valve is degenerative. No evidence of mitral valve regurgitation. Mild mitral stenosis. The mean mitral valve gradient is 3.0 mmHg. Moderate mitral annular calcification. 4. The aortic valve is tricuspid. There is moderate calcification of the aortic valve. Aortic valve regurgitation is trivial. Aortic valve sclerosis/calcification is present, without any evidence of aortic stenosis. 5. Aortic dilatation noted. There is mild dilatation of the ascending aorta, measuring 40 mm.  FINDINGS Left Ventricle: Left ventricular ejection fraction, by estimation, is 55 to 60%. The left ventricle has normal function. The left ventricle has no regional wall motion abnormalities. The left ventricular internal cavity size was normal in size. There is mild left ventricular hypertrophy. Left ventricular diastolic parameters are consistent with Grade I diastolic dysfunction (impaired relaxation).  Right  Ventricle: The right ventricular size is normal. No increase in right ventricular wall thickness. Right ventricular systolic function is mildly reduced. Tricuspid regurgitation signal is inadequate for assessing PA pressure.  Left Atrium: Left atrial size was normal in size.  Right Atrium: Right atrial size was normal in size.  Pericardium: There is no evidence of pericardial effusion.  Mitral Valve: The mitral valve is degenerative in appearance. There is mild thickening of the mitral valve leaflet(s). Moderate mitral annular calcification. No evidence of mitral valve regurgitation. Mild mitral valve stenosis. MV  peak gradient, 6.8 mmHg. The mean mitral valve gradient is 3.0 mmHg.  Tricuspid Valve: The tricuspid valve is normal in structure. Tricuspid valve regurgitation is trivial.  Aortic Valve: The aortic valve is tricuspid. There is moderate calcification of the aortic valve. Aortic valve regurgitation is trivial. Aortic regurgitation PHT measures 558 msec. Aortic valve sclerosis/calcification is present, without any evidence of aortic stenosis. Aortic valve mean gradient measures 4.0 mmHg. Aortic valve peak gradient measures 7.7 mmHg. Aortic valve area, by VTI measures 2.59 cm.  Pulmonic Valve: The pulmonic valve was normal in structure. Pulmonic valve regurgitation is not visualized.  Aorta: The aortic root is normal in size and structure and aortic dilatation noted. There is mild dilatation of the ascending aorta, measuring 40 mm.  Venous: The inferior vena cava was not well visualized.  IAS/Shunts: No atrial level shunt detected by color flow Doppler.   LEFT VENTRICLE PLAX 2D LVIDd:         3.20 cm   Diastology LVIDs:         2.50 cm   LV e' medial:    5.11 cm/s LV PW:         1.30 cm   LV E/e' medial:  11.1 LV IVS:        1.30 cm   LV e' lateral:   4.90 cm/s LVOT diam:     2.30 cm   LV E/e' lateral: 11.6 LV SV:         70 LV SV Index:   35 LVOT Area:     4.15  cm   RIGHT VENTRICLE            IVC RV Basal diam:  2.20 cm    IVC diam: 2.60 cm RV S prime:     8.92 cm/s TAPSE (M-mode): 1.6 cm  LEFT ATRIUM             Index        RIGHT ATRIUM          Index LA diam:        3.80 cm 1.88 cm/m   RA Area:     9.02 cm LA Vol (A2C):   30.0 ml 14.83 ml/m  RA Volume:   16.40 ml 8.11 ml/m LA Vol (A4C):   26.3 ml 13.00 ml/m LA Biplane Vol: 29.0 ml 14.34 ml/m AORTIC VALVE                    PULMONIC VALVE AV Area (Vmax):    2.53 cm     PV Vmax:       0.84 m/s AV Area (Vmean):   2.49 cm     PV Peak grad:  2.8 mmHg AV Area (VTI):     2.59 cm AV Vmax:           139.00 cm/s AV Vmean:          95.700 cm/s AV VTI:            0.270 m AV Peak Grad:      7.7 mmHg AV Mean Grad:      4.0 mmHg LVOT Vmax:         84.80 cm/s LVOT Vmean:        57.400 cm/s LVOT VTI:          0.168 m LVOT/AV VTI ratio: 0.62 AI PHT:            558 msec  AORTA Ao Root diam: 3.10 cm Ao  Asc diam:  4.00 cm  MITRAL VALVE                TRICUSPID VALVE MV Area (PHT): 2.07 cm     TR Peak grad:   12.4 mmHg MV Area VTI:   2.45 cm     TR Vmax:        176.00 cm/s MV Peak grad:  6.8 mmHg MV Mean grad:  3.0 mmHg     SHUNTS MV Vmax:       1.31 m/s     Systemic VTI:  0.17 m MV Vmean:      71.9 cm/s    Systemic Diam: 2.30 cm MV Decel Time: 367 msec MV E velocity: 56.60 cm/s MV A velocity: 109.00 cm/s MV E/A ratio:  0.52  Dalton McleanMD Electronically signed by Franki Monte Signature Date/Time: 03/25/2022/4:51:00 PM    Final            He had a carotid duplex performed 01/20/2023 showing no evidence of restenosis after carotid endarterectomy and 1 to 39% stenosis on the contralateral carotid artery  EKG:  EKG ordered today and personally reviewed.  The ekg ordered today demonstrates sinus rhythm normal EKG  Recent Labs: 03/28/2022: ALT 9 03/31/2022: Magnesium 2.0 04/02/2022: BUN 46; Creatinine, Ser 1.84; Hemoglobin 8.7; Platelets 279; Potassium 3.9; Sodium 142   Recent Lipid Panel    Component Value Date/Time   CHOL 146 08/27/2017 1039   TRIG 119 08/27/2017 1039   HDL 34 (L) 08/27/2017 1039   CHOLHDL 4.3 08/27/2017 1039   CHOLHDL 3.9 03/22/2015 1136   VLDL 44 (H) 03/22/2015 1136   LDLCALC 88 08/27/2017 1039    Physical Exam:    VS:  BP 110/60 (BP Location: Right Arm, Patient Position: Sitting, Cuff Size: Normal)   Pulse 68   Ht '5\' 6"'$  (1.676 m)   Wt 212 lb (96.2 kg) Comment: Verbal, its been a while  SpO2 99%   BMI 34.22 kg/m     Wt Readings from Last 3 Encounters:  02/20/23 212 lb (96.2 kg)  03/21/22 205 lb 7.5 oz (93.2 kg)  01/18/22 201 lb 12.8 oz (91.5 kg)     GEN:  Well nourished, well developed in no acute distress HEENT: Normal NECK: No JVD; No carotid bruits LYMPHATICS: No lymphadenopathy CARDIAC: RRR, no murmurs, rubs, gallops RESPIRATORY:  Clear to auscultation without rales, wheezing or rhonchi  ABDOMEN: Soft, non-tender, non-distended MUSCULOSKELETAL:  No edema; No deformity  SKIN: Warm and dry NEUROLOGIC:  Alert and oriented x 3 PSYCHIATRIC:  Normal affect    Signed, Shirlee More, MD  02/20/2023 11:59 AM    [place]

## 2023-02-20 ENCOUNTER — Encounter: Payer: Self-pay | Admitting: Cardiology

## 2023-02-20 ENCOUNTER — Ambulatory Visit: Payer: No Typology Code available for payment source | Attending: Cardiology | Admitting: Cardiology

## 2023-02-20 VITALS — BP 110/60 | HR 68 | Ht 66.0 in | Wt 212.0 lb

## 2023-02-20 DIAGNOSIS — I25118 Atherosclerotic heart disease of native coronary artery with other forms of angina pectoris: Secondary | ICD-10-CM

## 2023-02-20 DIAGNOSIS — E7800 Pure hypercholesterolemia, unspecified: Secondary | ICD-10-CM | POA: Diagnosis not present

## 2023-02-20 DIAGNOSIS — I110 Hypertensive heart disease with heart failure: Secondary | ICD-10-CM | POA: Diagnosis not present

## 2023-02-20 DIAGNOSIS — N1832 Chronic kidney disease, stage 3b: Secondary | ICD-10-CM | POA: Diagnosis not present

## 2023-02-20 DIAGNOSIS — N183 Chronic kidney disease, stage 3 unspecified: Secondary | ICD-10-CM

## 2023-02-20 DIAGNOSIS — D631 Anemia in chronic kidney disease: Secondary | ICD-10-CM

## 2023-02-20 NOTE — Patient Instructions (Signed)
Medication Instructions:  Your physician recommends that you continue on your current medications as directed. Please refer to the Current Medication list given to you today.  *If you need a refill on your cardiac medications before your next appointment, please call your pharmacy*   Lab Work: None If you have labs (blood work) drawn today and your tests are completely normal, you will receive your results only by: MyChart Message (if you have MyChart) OR A paper copy in the mail If you have any lab test that is abnormal or we need to change your treatment, we will call you to review the results.   Testing/Procedures: None   Follow-Up: At [facility], you and your health needs are our priority.  As part of our continuing mission to provide you with exceptional heart care, we have created designated Provider Care Teams.  These Care Teams include your primary Cardiologist (physician) and Advanced Practice Providers (APPs -  Physician Assistants and Nurse Practitioners) who all work together to provide you with the care you need, when you need it.  We recommend signing up for the patient portal called "MyChart".  Sign up information is provided on this After Visit Summary.  MyChart is used to connect with patients for Virtual Visits (Telemedicine).  Patients are able to view lab/test results, encounter notes, upcoming appointments, etc.  Non-urgent messages can be sent to your provider as well.   To learn more about what you can do with MyChart, go to [email or web address].    Your next appointment:   6 month(s)  Provider:   Brian Munley, MD    Other Instructions None  

## 2023-06-24 LAB — LAB REPORT - SCANNED
A1c: 10.6
EGFR: 51

## 2023-08-06 ENCOUNTER — Telehealth: Payer: Self-pay | Admitting: Cardiology

## 2023-08-06 NOTE — Telephone Encounter (Signed)
Pt spouse called in. She is concerned because pt's doctor at [facility]  (Dr. Maisie Fus) wants to take pt off of Plavix and aspirin. She states he told them he was only supposed to be on it for a year. Pt spouse would like Dr. Hulen Shouts thoughts on it. Please adivse.

## 2023-08-14 ENCOUNTER — Other Ambulatory Visit: Payer: Self-pay

## 2023-08-14 NOTE — Telephone Encounter (Signed)
Left message for the patient to call back.

## 2023-08-15 NOTE — Telephone Encounter (Signed)
Left message for the patient to call back.

## 2023-08-18 NOTE — Telephone Encounter (Signed)
Called patient's wife Darl Pikes and informed her of Dr. Hulen Shouts recommendation below:  "With his coronary and peripheral vascular disease had like him to stay on both unless he is having a bleeding complication"  Patients wife was agreeable with this plan and had no further questions at this time.

## 2023-08-18 NOTE — Telephone Encounter (Signed)
Left message for Johnathan Keith to call back.

## 2023-10-05 NOTE — Progress Notes (Unsigned)
three) times daily. 100,000 units   pantoprazole (PROTONIX) 40 MG tablet Take 1 tablet (40 mg total) by mouth daily.   risperiDONE (RISPERDAL) 0.5 MG tablet Take 0.5 mg by mouth 2 (two) times daily.   rosuvastatin (CRESTOR) 20 MG tablet Take 20 mg by mouth daily.   silver sulfADIAZINE (SILVADENE) 1 % cream Apply 1 Application topically daily. 1.5 mm   sodium fluoride (LURIDE) 1.1 (0.5 F) MG/ML SOLN Take 1 drop by mouth daily.   spironolactone (ALDACTONE) 25 MG tablet Take 25 mg by mouth daily.   tamsulosin (FLOMAX) 0.4 MG CAPS capsule Take 1 capsule (0.4 mg total) by mouth daily.   torsemide (DEMADEX) 20 MG tablet Take 1 tablet (20 mg total) by mouth daily. Take an extra Torsemide in the evening as needed if having orthopnea   [DISCONTINUED] feeding supplement (ENSURE ENLIVE / ENSURE PLUS) LIQD Take 237 mLs by mouth 3 (three) times daily between meals.   [DISCONTINUED] loperamide (IMODIUM) 2 MG capsule Take 2 mg by mouth 4 (four) times daily as needed for diarrhea or loose stools.   [DISCONTINUED] montelukast (SINGULAIR) 10 MG tablet Take 10 mg by mouth daily at 12 noon.   [DISCONTINUED] neomycin-polymyxin-hydrocortisone (CORTISPORIN) OTIC solution Apply 1-2 drops to each toenail corners at bedtime for pain and infection at ingrowing nails (Patient taking differently: Place 1-2 drops into both ears See admin instructions. Apply 1-2 drops to each toenail corners at bedtime for pain and infection at ingrowing nails)   [DISCONTINUED] Pancrelipase, Lip-Prot-Amyl, (CREON PO) Take 1 capsule by mouth 2 (two) times daily with a meal. or snacks   [DISCONTINUED] potassium chloride SA (KLOR-CON M) 20 MEQ tablet Take 20 mEq by mouth every other day.   [DISCONTINUED] risperiDONE (RISPERDAL) 0.25 MG tablet Take 0.25 mg by mouth 2  (two) times daily.   [DISCONTINUED] torsemide (DEMADEX) 20 MG tablet Take 1 tablet (20 mg total) by mouth daily.      EKGs/Labs/Other Studies Reviewed:    The following studies were reviewed today:  Cardiac Studies & Procedures   CARDIAC CATHETERIZATION  CARDIAC CATHETERIZATION 09/29/2017  Narrative Images from the original result were not included.   Prox LAD to Mid LAD lesion, 0 %stenosed.  Mid LAD to Dist LAD lesion, 0 %stenosed.  Dist LAD lesion, 80 %stenosed.  Post intervention, there is a 5% residual stenosis.  A stent was successfully placed.  The left ventricular systolic function is normal.  LV end diastolic pressure is normal.  The left ventricular ejection fraction is 55-65% by visual estimate.  Brylen Collens is a 62 y.o. male   [id number] LOCATION:  FACILITY: [facility] PHYSICIAN: Nanetta Batty, M.D. 12-Nov-1961   DATE OF PROCEDURE:  09/29/2017  DATE OF DISCHARGE:     CARDIAC CATHETERIZATION / PCI DES LAD    History obtained from chart review.Emeal Mailhot is a 62 y.o. male mildly overweight married Caucasian male who I last saw in the office 08/27/17.  In addition to hypertension, his cardiovascular history is significant for prior TIA subsequent to carotid artery disease. He underwent right carotid endarterectomy performed by Dr. [name] on 09/26/2014. There is also question of remote myocardial infarction in the past. He recently underwent evaluation with a 2-D echocardiogram that was performed preoperatively which showed normal LV function as well as a Myoview stress test that had subtle inferior and lateral ischemia.  At that time he had no SOB or chest pain and was cleared for surgery. His history is also notable for hyperlipidemia and diabetes. He was  three) times daily. 100,000 units   pantoprazole (PROTONIX) 40 MG tablet Take 1 tablet (40 mg total) by mouth daily.   risperiDONE (RISPERDAL) 0.5 MG tablet Take 0.5 mg by mouth 2 (two) times daily.   rosuvastatin (CRESTOR) 20 MG tablet Take 20 mg by mouth daily.   silver sulfADIAZINE (SILVADENE) 1 % cream Apply 1 Application topically daily. 1.5 mm   sodium fluoride (LURIDE) 1.1 (0.5 F) MG/ML SOLN Take 1 drop by mouth daily.   spironolactone (ALDACTONE) 25 MG tablet Take 25 mg by mouth daily.   tamsulosin (FLOMAX) 0.4 MG CAPS capsule Take 1 capsule (0.4 mg total) by mouth daily.   torsemide (DEMADEX) 20 MG tablet Take 1 tablet (20 mg total) by mouth daily. Take an extra Torsemide in the evening as needed if having orthopnea   [DISCONTINUED] feeding supplement (ENSURE ENLIVE / ENSURE PLUS) LIQD Take 237 mLs by mouth 3 (three) times daily between meals.   [DISCONTINUED] loperamide (IMODIUM) 2 MG capsule Take 2 mg by mouth 4 (four) times daily as needed for diarrhea or loose stools.   [DISCONTINUED] montelukast (SINGULAIR) 10 MG tablet Take 10 mg by mouth daily at 12 noon.   [DISCONTINUED] neomycin-polymyxin-hydrocortisone (CORTISPORIN) OTIC solution Apply 1-2 drops to each toenail corners at bedtime for pain and infection at ingrowing nails (Patient taking differently: Place 1-2 drops into both ears See admin instructions. Apply 1-2 drops to each toenail corners at bedtime for pain and infection at ingrowing nails)   [DISCONTINUED] Pancrelipase, Lip-Prot-Amyl, (CREON PO) Take 1 capsule by mouth 2 (two) times daily with a meal. or snacks   [DISCONTINUED] potassium chloride SA (KLOR-CON M) 20 MEQ tablet Take 20 mEq by mouth every other day.   [DISCONTINUED] risperiDONE (RISPERDAL) 0.25 MG tablet Take 0.25 mg by mouth 2  (two) times daily.   [DISCONTINUED] torsemide (DEMADEX) 20 MG tablet Take 1 tablet (20 mg total) by mouth daily.      EKGs/Labs/Other Studies Reviewed:    The following studies were reviewed today:  Cardiac Studies & Procedures   CARDIAC CATHETERIZATION  CARDIAC CATHETERIZATION 09/29/2017  Narrative Images from the original result were not included.   Prox LAD to Mid LAD lesion, 0 %stenosed.  Mid LAD to Dist LAD lesion, 0 %stenosed.  Dist LAD lesion, 80 %stenosed.  Post intervention, there is a 5% residual stenosis.  A stent was successfully placed.  The left ventricular systolic function is normal.  LV end diastolic pressure is normal.  The left ventricular ejection fraction is 55-65% by visual estimate.  Brylen Collens is a 62 y.o. male   [id number] LOCATION:  FACILITY: [facility] PHYSICIAN: Nanetta Batty, M.D. 12-Nov-1961   DATE OF PROCEDURE:  09/29/2017  DATE OF DISCHARGE:     CARDIAC CATHETERIZATION / PCI DES LAD    History obtained from chart review.Emeal Mailhot is a 62 y.o. male mildly overweight married Caucasian male who I last saw in the office 08/27/17.  In addition to hypertension, his cardiovascular history is significant for prior TIA subsequent to carotid artery disease. He underwent right carotid endarterectomy performed by Dr. [name] on 09/26/2014. There is also question of remote myocardial infarction in the past. He recently underwent evaluation with a 2-D echocardiogram that was performed preoperatively which showed normal LV function as well as a Myoview stress test that had subtle inferior and lateral ischemia.  At that time he had no SOB or chest pain and was cleared for surgery. His history is also notable for hyperlipidemia and diabetes. He was  0% residual. 200 g of intra-coronary medication was administered. The patient received an additional 300 mg of by mouth Plavix at the end of the case. The guidewire and catheter were removed and the sheath was removed as well. A TR band was placed on the right wrist to achieve patent hemostasis.   IMPRESSION:successful mid to distal LAD primary PCI and drug-eluting stenting using a Synergy 2.25 mm x 12 mm millimeter long drug-eluting stent deployed at 2.43 mm (16 atm). This may be because the patient's dyspnea since it was similar to his prior symptoms which were alleviated by proximal and mid LAD intervention to a half years  ago. This was an uncomplicated low risk intervention. The patient is appropriate for same-day PCI discharge. I will see him back in the office next week. He will remain on dual antiplatelet therapy.  Nanetta Batty. MD, [facility] 09/29/2017 2:47 PM  Findings Coronary Findings Diagnostic  Dominance: Right  Left Anterior Descending Prox LAD to Mid LAD lesion with no stenosis was previously treated. Mid LAD to Dist LAD lesion with no stenosis was previously treated.  Intervention  Dist LAD lesion Angioplasty A stent was successfully placed. There is a 5% residual stenosis post intervention.   STRESS TESTS  MYOCARDIAL PERFUSION IMAGING 09/09/2017  Narrative  Nuclear stress EF: 60%. The left ventricular ejection fraction is normal (55-65%).  The study is normal.  This is a low risk study.   ECHOCARDIOGRAM  ECHOCARDIOGRAM COMPLETE 03/25/2022  Narrative ECHOCARDIOGRAM REPORT    Patient Name:   Johnathan Keith Date of Exam: 03/25/2022 Medical Rec #:  [id number]      Height:       66.0 in Accession #:    [id number]     Weight:       205.5 lb Date of Birth:  19-Dec-1960      BSA:          2.023 m Patient Age:    62 years       BP:           113/65 mmHg Patient Gender: M              HR:           83 bpm. Exam Location:  Inpatient  Procedure: 2D Echo  Indications:    Stroke  History:        Patient has prior history of Echocardiogram examinations, most recent 05/10/2019. CAD, Stroke, Arrythmias:Bradycardia; Risk Factors:Diabetes and Hypertension.  Sonographer:    Devonne Doughty Referring Phys: [id number] [facility]   Sonographer Comments: Image acquisition challenging due to patient body habitus. IMPRESSIONS   1. Left ventricular ejection fraction, by estimation, is 55 to 60%. The left ventricle has normal function. The left ventricle has no regional wall motion abnormalities. There is mild left ventricular hypertrophy. Left ventricular diastolic parameters are consistent  with Grade I diastolic dysfunction (impaired relaxation). 2. Right ventricular systolic function is mildly reduced. The right ventricular size is normal. Tricuspid regurgitation signal is inadequate for assessing PA pressure. 3. The mitral valve is degenerative. No evidence of mitral valve regurgitation. Mild mitral stenosis. The mean mitral valve gradient is 3.0 mmHg. Moderate mitral annular calcification. 4. The aortic valve is tricuspid. There is moderate calcification of the aortic valve. Aortic valve regurgitation is trivial. Aortic valve sclerosis/calcification is present, without any evidence of aortic stenosis. 5. Aortic dilatation noted. There is mild dilatation of the ascending aorta, measuring 40 mm.  FINDINGS Left Ventricle: Left ventricular ejection fraction, by  0% residual. 200 g of intra-coronary medication was administered. The patient received an additional 300 mg of by mouth Plavix at the end of the case. The guidewire and catheter were removed and the sheath was removed as well. A TR band was placed on the right wrist to achieve patent hemostasis.   IMPRESSION:successful mid to distal LAD primary PCI and drug-eluting stenting using a Synergy 2.25 mm x 12 mm millimeter long drug-eluting stent deployed at 2.43 mm (16 atm). This may be because the patient's dyspnea since it was similar to his prior symptoms which were alleviated by proximal and mid LAD intervention to a half years  ago. This was an uncomplicated low risk intervention. The patient is appropriate for same-day PCI discharge. I will see him back in the office next week. He will remain on dual antiplatelet therapy.  Nanetta Batty. MD, [facility] 09/29/2017 2:47 PM  Findings Coronary Findings Diagnostic  Dominance: Right  Left Anterior Descending Prox LAD to Mid LAD lesion with no stenosis was previously treated. Mid LAD to Dist LAD lesion with no stenosis was previously treated.  Intervention  Dist LAD lesion Angioplasty A stent was successfully placed. There is a 5% residual stenosis post intervention.   STRESS TESTS  MYOCARDIAL PERFUSION IMAGING 09/09/2017  Narrative  Nuclear stress EF: 60%. The left ventricular ejection fraction is normal (55-65%).  The study is normal.  This is a low risk study.   ECHOCARDIOGRAM  ECHOCARDIOGRAM COMPLETE 03/25/2022  Narrative ECHOCARDIOGRAM REPORT    Patient Name:   Johnathan Keith Date of Exam: 03/25/2022 Medical Rec #:  [id number]      Height:       66.0 in Accession #:    [id number]     Weight:       205.5 lb Date of Birth:  19-Dec-1960      BSA:          2.023 m Patient Age:    62 years       BP:           113/65 mmHg Patient Gender: M              HR:           83 bpm. Exam Location:  Inpatient  Procedure: 2D Echo  Indications:    Stroke  History:        Patient has prior history of Echocardiogram examinations, most recent 05/10/2019. CAD, Stroke, Arrythmias:Bradycardia; Risk Factors:Diabetes and Hypertension.  Sonographer:    Devonne Doughty Referring Phys: [id number] [facility]   Sonographer Comments: Image acquisition challenging due to patient body habitus. IMPRESSIONS   1. Left ventricular ejection fraction, by estimation, is 55 to 60%. The left ventricle has normal function. The left ventricle has no regional wall motion abnormalities. There is mild left ventricular hypertrophy. Left ventricular diastolic parameters are consistent  with Grade I diastolic dysfunction (impaired relaxation). 2. Right ventricular systolic function is mildly reduced. The right ventricular size is normal. Tricuspid regurgitation signal is inadequate for assessing PA pressure. 3. The mitral valve is degenerative. No evidence of mitral valve regurgitation. Mild mitral stenosis. The mean mitral valve gradient is 3.0 mmHg. Moderate mitral annular calcification. 4. The aortic valve is tricuspid. There is moderate calcification of the aortic valve. Aortic valve regurgitation is trivial. Aortic valve sclerosis/calcification is present, without any evidence of aortic stenosis. 5. Aortic dilatation noted. There is mild dilatation of the ascending aorta, measuring 40 mm.  FINDINGS Left Ventricle: Left ventricular ejection fraction, by  0% residual. 200 g of intra-coronary medication was administered. The patient received an additional 300 mg of by mouth Plavix at the end of the case. The guidewire and catheter were removed and the sheath was removed as well. A TR band was placed on the right wrist to achieve patent hemostasis.   IMPRESSION:successful mid to distal LAD primary PCI and drug-eluting stenting using a Synergy 2.25 mm x 12 mm millimeter long drug-eluting stent deployed at 2.43 mm (16 atm). This may be because the patient's dyspnea since it was similar to his prior symptoms which were alleviated by proximal and mid LAD intervention to a half years  ago. This was an uncomplicated low risk intervention. The patient is appropriate for same-day PCI discharge. I will see him back in the office next week. He will remain on dual antiplatelet therapy.  Nanetta Batty. MD, [facility] 09/29/2017 2:47 PM  Findings Coronary Findings Diagnostic  Dominance: Right  Left Anterior Descending Prox LAD to Mid LAD lesion with no stenosis was previously treated. Mid LAD to Dist LAD lesion with no stenosis was previously treated.  Intervention  Dist LAD lesion Angioplasty A stent was successfully placed. There is a 5% residual stenosis post intervention.   STRESS TESTS  MYOCARDIAL PERFUSION IMAGING 09/09/2017  Narrative  Nuclear stress EF: 60%. The left ventricular ejection fraction is normal (55-65%).  The study is normal.  This is a low risk study.   ECHOCARDIOGRAM  ECHOCARDIOGRAM COMPLETE 03/25/2022  Narrative ECHOCARDIOGRAM REPORT    Patient Name:   Johnathan Keith Date of Exam: 03/25/2022 Medical Rec #:  [id number]      Height:       66.0 in Accession #:    [id number]     Weight:       205.5 lb Date of Birth:  19-Dec-1960      BSA:          2.023 m Patient Age:    62 years       BP:           113/65 mmHg Patient Gender: M              HR:           83 bpm. Exam Location:  Inpatient  Procedure: 2D Echo  Indications:    Stroke  History:        Patient has prior history of Echocardiogram examinations, most recent 05/10/2019. CAD, Stroke, Arrythmias:Bradycardia; Risk Factors:Diabetes and Hypertension.  Sonographer:    Devonne Doughty Referring Phys: [id number] [facility]   Sonographer Comments: Image acquisition challenging due to patient body habitus. IMPRESSIONS   1. Left ventricular ejection fraction, by estimation, is 55 to 60%. The left ventricle has normal function. The left ventricle has no regional wall motion abnormalities. There is mild left ventricular hypertrophy. Left ventricular diastolic parameters are consistent  with Grade I diastolic dysfunction (impaired relaxation). 2. Right ventricular systolic function is mildly reduced. The right ventricular size is normal. Tricuspid regurgitation signal is inadequate for assessing PA pressure. 3. The mitral valve is degenerative. No evidence of mitral valve regurgitation. Mild mitral stenosis. The mean mitral valve gradient is 3.0 mmHg. Moderate mitral annular calcification. 4. The aortic valve is tricuspid. There is moderate calcification of the aortic valve. Aortic valve regurgitation is trivial. Aortic valve sclerosis/calcification is present, without any evidence of aortic stenosis. 5. Aortic dilatation noted. There is mild dilatation of the ascending aorta, measuring 40 mm.  FINDINGS Left Ventricle: Left ventricular ejection fraction, by  0% residual. 200 g of intra-coronary medication was administered. The patient received an additional 300 mg of by mouth Plavix at the end of the case. The guidewire and catheter were removed and the sheath was removed as well. A TR band was placed on the right wrist to achieve patent hemostasis.   IMPRESSION:successful mid to distal LAD primary PCI and drug-eluting stenting using a Synergy 2.25 mm x 12 mm millimeter long drug-eluting stent deployed at 2.43 mm (16 atm). This may be because the patient's dyspnea since it was similar to his prior symptoms which were alleviated by proximal and mid LAD intervention to a half years  ago. This was an uncomplicated low risk intervention. The patient is appropriate for same-day PCI discharge. I will see him back in the office next week. He will remain on dual antiplatelet therapy.  Nanetta Batty. MD, [facility] 09/29/2017 2:47 PM  Findings Coronary Findings Diagnostic  Dominance: Right  Left Anterior Descending Prox LAD to Mid LAD lesion with no stenosis was previously treated. Mid LAD to Dist LAD lesion with no stenosis was previously treated.  Intervention  Dist LAD lesion Angioplasty A stent was successfully placed. There is a 5% residual stenosis post intervention.   STRESS TESTS  MYOCARDIAL PERFUSION IMAGING 09/09/2017  Narrative  Nuclear stress EF: 60%. The left ventricular ejection fraction is normal (55-65%).  The study is normal.  This is a low risk study.   ECHOCARDIOGRAM  ECHOCARDIOGRAM COMPLETE 03/25/2022  Narrative ECHOCARDIOGRAM REPORT    Patient Name:   Johnathan Keith Date of Exam: 03/25/2022 Medical Rec #:  [id number]      Height:       66.0 in Accession #:    [id number]     Weight:       205.5 lb Date of Birth:  19-Dec-1960      BSA:          2.023 m Patient Age:    62 years       BP:           113/65 mmHg Patient Gender: M              HR:           83 bpm. Exam Location:  Inpatient  Procedure: 2D Echo  Indications:    Stroke  History:        Patient has prior history of Echocardiogram examinations, most recent 05/10/2019. CAD, Stroke, Arrythmias:Bradycardia; Risk Factors:Diabetes and Hypertension.  Sonographer:    Devonne Doughty Referring Phys: [id number] [facility]   Sonographer Comments: Image acquisition challenging due to patient body habitus. IMPRESSIONS   1. Left ventricular ejection fraction, by estimation, is 55 to 60%. The left ventricle has normal function. The left ventricle has no regional wall motion abnormalities. There is mild left ventricular hypertrophy. Left ventricular diastolic parameters are consistent  with Grade I diastolic dysfunction (impaired relaxation). 2. Right ventricular systolic function is mildly reduced. The right ventricular size is normal. Tricuspid regurgitation signal is inadequate for assessing PA pressure. 3. The mitral valve is degenerative. No evidence of mitral valve regurgitation. Mild mitral stenosis. The mean mitral valve gradient is 3.0 mmHg. Moderate mitral annular calcification. 4. The aortic valve is tricuspid. There is moderate calcification of the aortic valve. Aortic valve regurgitation is trivial. Aortic valve sclerosis/calcification is present, without any evidence of aortic stenosis. 5. Aortic dilatation noted. There is mild dilatation of the ascending aorta, measuring 40 mm.  FINDINGS Left Ventricle: Left ventricular ejection fraction, by  Cardiology Office Note:    Date:  10/06/2023   ID:  Jefery Skrzypek, DOB 06/07/1961, MRN [id number]  PCP:  Olive Bass, MD  Cardiologist:  Norman Herrlich, MD    Referring MD: Olive Bass, MD    ASSESSMENT:    1. Hypertensive heart disease with heart failure (HCC)   2. Stage 3b chronic kidney disease (CKD) (HCC)   3. Anemia due to stage 3 chronic kidney disease treated with erythropoietin (HCC)   4. Coronary artery disease of native artery of native heart with stable angina pectoris (HCC)   5. High cholesterol    PLAN:    In order of problems listed above:  Heart failure appears compensated I asked his wife if he short of breath in the knees with opting to give him an extra tablet torsemide to alleviate his symptoms continue his beta-blocker and current open distal diuretic Requested labs from the [facility] including his renal function and hemoglobin Stable CAD and PAD I would keep him on long-term platelet therapy in his case Continue his current statin again requested labs Next appointment: 6 months   Medication Adjustments/Labs and Tests Ordered: Current medicines are reviewed at length with the patient today.  Concerns regarding medicines are outlined above.  No orders of the defined types were placed in this encounter.  Meds ordered this encounter  Medications   torsemide (DEMADEX) 20 MG tablet    Sig: Take 1 tablet (20 mg total) by mouth daily. Take an extra Torsemide in the evening as needed if having orthopnea    Dispense:  90 tablet    Refill:  3     History of Present Illness:    Errol Kresge is a 62 y.o. male with a hx of CAD hypertensive heart disease with heart failure mildly reduced ejection fraction 40 to 45% type 2 diabetes hyperlipidemia stage IIIb CKD anemia due to CKD and carotid disease with previous right carotid artery endarterectomy and history of stroke last seen 02/20/2023.  Compliance with diet, lifestyle and medications:  Yes  Recently was placed on Ativan and he is not communicative today His wife says that at times he short of breath in the evening she will prop the head of the bed up and I will have her take extra tablet of torsemide in the evenings if he has orthopnea. I cannot access any recent lab work I will request from [facility] No complaints of chest pain edema. Past Medical History:  Diagnosis Date   Abnormal nuclear cardiac imaging test    High-risk nuclear stress test    Adjustment disorder with mixed anxiety and depressed mood 07/27/2020   AKI (acute kidney injury) (HCC) 03/26/2020   Arthritis    Bicuspid aortic valve 03/27/2020   Formatting of this note might be different from the original. 2020: ECHO   Bilateral low back pain without sciatica 09/13/2015   Bilateral lower extremity edema 08/18/2018   Bilateral lower extremity edema  Formatting of this note might be different from the original. Bilateral lower extremity edema  Last Assessment & Plan:  He does have 1-2+ pitting edema.  I am going to increase his Lasix to 80 mg a day, and will check a basic metabolic panel in 7 to 10 days.  He will see Baxter Hire back in 1 month for follow-up of lab work.   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 Admitted  0% residual. 200 g of intra-coronary medication was administered. The patient received an additional 300 mg of by mouth Plavix at the end of the case. The guidewire and catheter were removed and the sheath was removed as well. A TR band was placed on the right wrist to achieve patent hemostasis.   IMPRESSION:successful mid to distal LAD primary PCI and drug-eluting stenting using a Synergy 2.25 mm x 12 mm millimeter long drug-eluting stent deployed at 2.43 mm (16 atm). This may be because the patient's dyspnea since it was similar to his prior symptoms which were alleviated by proximal and mid LAD intervention to a half years  ago. This was an uncomplicated low risk intervention. The patient is appropriate for same-day PCI discharge. I will see him back in the office next week. He will remain on dual antiplatelet therapy.  Nanetta Batty. MD, [facility] 09/29/2017 2:47 PM  Findings Coronary Findings Diagnostic  Dominance: Right  Left Anterior Descending Prox LAD to Mid LAD lesion with no stenosis was previously treated. Mid LAD to Dist LAD lesion with no stenosis was previously treated.  Intervention  Dist LAD lesion Angioplasty A stent was successfully placed. There is a 5% residual stenosis post intervention.   STRESS TESTS  MYOCARDIAL PERFUSION IMAGING 09/09/2017  Narrative  Nuclear stress EF: 60%. The left ventricular ejection fraction is normal (55-65%).  The study is normal.  This is a low risk study.   ECHOCARDIOGRAM  ECHOCARDIOGRAM COMPLETE 03/25/2022  Narrative ECHOCARDIOGRAM REPORT    Patient Name:   Johnathan Keith Date of Exam: 03/25/2022 Medical Rec #:  [id number]      Height:       66.0 in Accession #:    [id number]     Weight:       205.5 lb Date of Birth:  19-Dec-1960      BSA:          2.023 m Patient Age:    62 years       BP:           113/65 mmHg Patient Gender: M              HR:           83 bpm. Exam Location:  Inpatient  Procedure: 2D Echo  Indications:    Stroke  History:        Patient has prior history of Echocardiogram examinations, most recent 05/10/2019. CAD, Stroke, Arrythmias:Bradycardia; Risk Factors:Diabetes and Hypertension.  Sonographer:    Devonne Doughty Referring Phys: [id number] [facility]   Sonographer Comments: Image acquisition challenging due to patient body habitus. IMPRESSIONS   1. Left ventricular ejection fraction, by estimation, is 55 to 60%. The left ventricle has normal function. The left ventricle has no regional wall motion abnormalities. There is mild left ventricular hypertrophy. Left ventricular diastolic parameters are consistent  with Grade I diastolic dysfunction (impaired relaxation). 2. Right ventricular systolic function is mildly reduced. The right ventricular size is normal. Tricuspid regurgitation signal is inadequate for assessing PA pressure. 3. The mitral valve is degenerative. No evidence of mitral valve regurgitation. Mild mitral stenosis. The mean mitral valve gradient is 3.0 mmHg. Moderate mitral annular calcification. 4. The aortic valve is tricuspid. There is moderate calcification of the aortic valve. Aortic valve regurgitation is trivial. Aortic valve sclerosis/calcification is present, without any evidence of aortic stenosis. 5. Aortic dilatation noted. There is mild dilatation of the ascending aorta, measuring 40 mm.  FINDINGS Left Ventricle: Left ventricular ejection fraction, by

## 2023-10-06 ENCOUNTER — Encounter: Payer: Self-pay | Admitting: Cardiology

## 2023-10-06 ENCOUNTER — Ambulatory Visit: Payer: No Typology Code available for payment source | Attending: Cardiology | Admitting: Cardiology

## 2023-10-06 VITALS — BP 112/72 | HR 76 | Ht 66.0 in | Wt 204.8 lb

## 2023-10-06 DIAGNOSIS — I110 Hypertensive heart disease with heart failure: Secondary | ICD-10-CM | POA: Diagnosis not present

## 2023-10-06 DIAGNOSIS — N1832 Chronic kidney disease, stage 3b: Secondary | ICD-10-CM | POA: Diagnosis not present

## 2023-10-06 DIAGNOSIS — N183 Chronic kidney disease, stage 3 unspecified: Secondary | ICD-10-CM

## 2023-10-06 DIAGNOSIS — D631 Anemia in chronic kidney disease: Secondary | ICD-10-CM

## 2023-10-06 DIAGNOSIS — E7800 Pure hypercholesterolemia, unspecified: Secondary | ICD-10-CM

## 2023-10-06 DIAGNOSIS — I25118 Atherosclerotic heart disease of native coronary artery with other forms of angina pectoris: Secondary | ICD-10-CM | POA: Diagnosis not present

## 2023-10-06 MED ORDER — TORSEMIDE 20 MG PO TABS: 20.0000 mg | ORAL_TABLET | Freq: Every day | ORAL | 3 refills | Status: DC

## 2023-10-06 NOTE — Patient Instructions (Signed)
Medication Instructions:  Your physician has recommended you make the following change in your medication:   START: Torsemide 20 mg daily. (Take an extra Torsemide in the evening as needed if having orthopnea)   *If you need a refill on your cardiac medications before your next appointment, please call your pharmacy*   Lab Work: None If you have labs (blood work) drawn today and your tests are completely normal, you will receive your results only by: MyChart Message (if you have MyChart) OR A paper copy in the mail If you have any lab test that is abnormal or we need to change your treatment, we will call you to review the results.   Testing/Procedures: None   Follow-Up: At [facility], you and your health needs are our priority.  As part of our continuing mission to provide you with exceptional heart care, we have created designated Provider Care Teams.  These Care Teams include your primary Cardiologist (physician) and Advanced Practice Providers (APPs -  Physician Assistants and Nurse Practitioners) who all work together to provide you with the care you need, when you need it.  We recommend signing up for the patient portal called "MyChart".  Sign up information is provided on this After Visit Summary.  MyChart is used to connect with patients for Virtual Visits (Telemedicine).  Patients are able to view lab/test results, encounter notes, upcoming appointments, etc.  Non-urgent messages can be sent to your provider as well.   To learn more about what you can do with MyChart, go to [email or web address].    Your next appointment:   6 month(s)  Provider:   Norman Herrlich, MD    Other Instructions None

## 2024-05-09 DEATH — deceased
